# Patient Record
Sex: Female | Born: 1955 | Race: White | Hispanic: No | Smoking: Former smoker
Health system: Southern US, Community
[De-identification: ages and names within clinical notes are randomized; demographics above are authoritative.]

## PROBLEM LIST (undated history)

## (undated) DIAGNOSIS — Z86010 Personal history of colonic polyps: Secondary | ICD-10-CM

## (undated) DIAGNOSIS — F419 Anxiety disorder, unspecified: Secondary | ICD-10-CM

## (undated) DIAGNOSIS — M2540 Effusion, unspecified joint: Secondary | ICD-10-CM

## (undated) DIAGNOSIS — M542 Cervicalgia: Secondary | ICD-10-CM

## (undated) DIAGNOSIS — D4989 Neoplasm of unspecified behavior of other specified sites: Secondary | ICD-10-CM

## (undated) DIAGNOSIS — E785 Hyperlipidemia, unspecified: Secondary | ICD-10-CM

## (undated) DIAGNOSIS — R351 Nocturia: Secondary | ICD-10-CM

## (undated) DIAGNOSIS — M79661 Pain in right lower leg: Secondary | ICD-10-CM

## (undated) DIAGNOSIS — Z9289 Personal history of other medical treatment: Secondary | ICD-10-CM

## (undated) DIAGNOSIS — M1990 Unspecified osteoarthritis, unspecified site: Secondary | ICD-10-CM

## (undated) DIAGNOSIS — J449 Chronic obstructive pulmonary disease, unspecified: Secondary | ICD-10-CM

## (undated) DIAGNOSIS — K5790 Diverticulosis of intestine, part unspecified, without perforation or abscess without bleeding: Secondary | ICD-10-CM

## (undated) DIAGNOSIS — R569 Unspecified convulsions: Secondary | ICD-10-CM

## (undated) DIAGNOSIS — M549 Dorsalgia, unspecified: Secondary | ICD-10-CM

## (undated) DIAGNOSIS — G8929 Other chronic pain: Secondary | ICD-10-CM

## (undated) DIAGNOSIS — I5032 Chronic diastolic (congestive) heart failure: Secondary | ICD-10-CM

## (undated) DIAGNOSIS — F410 Panic disorder [episodic paroxysmal anxiety] without agoraphobia: Secondary | ICD-10-CM

## (undated) DIAGNOSIS — J42 Unspecified chronic bronchitis: Secondary | ICD-10-CM

## (undated) DIAGNOSIS — R112 Nausea with vomiting, unspecified: Secondary | ICD-10-CM

## (undated) DIAGNOSIS — Z9889 Other specified postprocedural states: Secondary | ICD-10-CM

## (undated) DIAGNOSIS — F429 Obsessive-compulsive disorder, unspecified: Secondary | ICD-10-CM

## (undated) DIAGNOSIS — K219 Gastro-esophageal reflux disease without esophagitis: Secondary | ICD-10-CM

## (undated) DIAGNOSIS — M2550 Pain in unspecified joint: Secondary | ICD-10-CM

## (undated) DIAGNOSIS — J189 Pneumonia, unspecified organism: Secondary | ICD-10-CM

## (undated) HISTORY — DX: Hyperlipidemia, unspecified: E78.5

## (undated) HISTORY — DX: Diverticulosis of intestine, part unspecified, without perforation or abscess without bleeding: K57.90

## (undated) HISTORY — PX: LAPAROSCOPIC LYSIS INTESTINAL ADHESIONS: SUR778

## (undated) HISTORY — DX: Gastro-esophageal reflux disease without esophagitis: K21.9

## (undated) HISTORY — PX: ECTOPIC PREGNANCY SURGERY: SHX613

## (undated) HISTORY — DX: Chronic obstructive pulmonary disease, unspecified: J44.9

## (undated) HISTORY — DX: Panic disorder (episodic paroxysmal anxiety): F41.0

## (undated) HISTORY — PX: COLONOSCOPY: SHX174

## (undated) HISTORY — DX: Obsessive-compulsive disorder, unspecified: F42.9

## (undated) HISTORY — DX: Anxiety disorder, unspecified: F41.9

## (undated) HISTORY — DX: Chronic diastolic (congestive) heart failure: I50.32

## (undated) HISTORY — PX: ESOPHAGOGASTRODUODENOSCOPY: SHX1529

## (undated) HISTORY — DX: Unspecified osteoarthritis, unspecified site: M19.90

## (undated) HISTORY — DX: Pain in right lower leg: M79.661

---

## 1997-10-27 ENCOUNTER — Emergency Department (HOSPITAL_COMMUNITY): Admission: EM | Admit: 1997-10-27 | Discharge: 1997-10-27 | Payer: Self-pay | Admitting: Emergency Medicine

## 1999-04-21 ENCOUNTER — Emergency Department (HOSPITAL_COMMUNITY): Admission: EM | Admit: 1999-04-21 | Discharge: 1999-04-22 | Payer: Self-pay

## 1999-04-22 ENCOUNTER — Emergency Department (HOSPITAL_COMMUNITY): Admission: EM | Admit: 1999-04-22 | Discharge: 1999-04-22 | Payer: Self-pay | Admitting: Emergency Medicine

## 1999-12-27 ENCOUNTER — Emergency Department (HOSPITAL_COMMUNITY): Admission: EM | Admit: 1999-12-27 | Discharge: 1999-12-27 | Payer: Self-pay | Admitting: Emergency Medicine

## 2000-10-01 ENCOUNTER — Emergency Department (HOSPITAL_COMMUNITY): Admission: EM | Admit: 2000-10-01 | Discharge: 2000-10-01 | Payer: Self-pay | Admitting: Internal Medicine

## 2000-11-02 ENCOUNTER — Other Ambulatory Visit: Admission: RE | Admit: 2000-11-02 | Discharge: 2000-11-02 | Payer: Self-pay | Admitting: Family Medicine

## 2001-03-07 ENCOUNTER — Emergency Department (HOSPITAL_COMMUNITY): Admission: EM | Admit: 2001-03-07 | Discharge: 2001-03-07 | Payer: Self-pay | Admitting: Emergency Medicine

## 2001-03-07 ENCOUNTER — Encounter: Payer: Self-pay | Admitting: Emergency Medicine

## 2001-03-19 ENCOUNTER — Encounter: Admission: RE | Admit: 2001-03-19 | Discharge: 2001-05-01 | Payer: Self-pay | Admitting: Family Medicine

## 2001-04-11 ENCOUNTER — Encounter: Payer: Self-pay | Admitting: Family Medicine

## 2001-04-11 ENCOUNTER — Ambulatory Visit (HOSPITAL_COMMUNITY): Admission: RE | Admit: 2001-04-11 | Discharge: 2001-04-11 | Payer: Self-pay | Admitting: Family Medicine

## 2001-04-29 ENCOUNTER — Emergency Department (HOSPITAL_COMMUNITY): Admission: EM | Admit: 2001-04-29 | Discharge: 2001-04-29 | Payer: Self-pay | Admitting: Emergency Medicine

## 2002-01-30 ENCOUNTER — Ambulatory Visit (HOSPITAL_COMMUNITY): Admission: RE | Admit: 2002-01-30 | Discharge: 2002-01-30 | Payer: Self-pay | Admitting: Neurosurgery

## 2002-02-14 ENCOUNTER — Other Ambulatory Visit: Admission: RE | Admit: 2002-02-14 | Discharge: 2002-02-14 | Payer: Self-pay | Admitting: Internal Medicine

## 2002-02-15 ENCOUNTER — Ambulatory Visit (HOSPITAL_COMMUNITY): Admission: RE | Admit: 2002-02-15 | Discharge: 2002-02-15 | Payer: Self-pay | Admitting: Orthopedic Surgery

## 2002-02-15 ENCOUNTER — Encounter: Payer: Self-pay | Admitting: Orthopedic Surgery

## 2002-02-15 ENCOUNTER — Encounter: Payer: Self-pay | Admitting: Internal Medicine

## 2002-02-15 ENCOUNTER — Encounter: Admission: RE | Admit: 2002-02-15 | Discharge: 2002-02-15 | Payer: Self-pay | Admitting: Internal Medicine

## 2002-02-22 ENCOUNTER — Encounter: Payer: Self-pay | Admitting: Internal Medicine

## 2002-02-22 ENCOUNTER — Encounter: Admission: RE | Admit: 2002-02-22 | Discharge: 2002-02-22 | Payer: Self-pay | Admitting: Internal Medicine

## 2002-06-25 ENCOUNTER — Emergency Department (HOSPITAL_COMMUNITY): Admission: EM | Admit: 2002-06-25 | Discharge: 2002-06-26 | Payer: Self-pay | Admitting: Emergency Medicine

## 2002-06-25 ENCOUNTER — Encounter: Payer: Self-pay | Admitting: Emergency Medicine

## 2002-08-28 ENCOUNTER — Observation Stay (HOSPITAL_COMMUNITY): Admission: AD | Admit: 2002-08-28 | Discharge: 2002-08-29 | Payer: Self-pay | Admitting: Obstetrics and Gynecology

## 2002-08-28 ENCOUNTER — Encounter: Payer: Self-pay | Admitting: Obstetrics and Gynecology

## 2002-08-28 ENCOUNTER — Encounter (INDEPENDENT_AMBULATORY_CARE_PROVIDER_SITE_OTHER): Payer: Self-pay

## 2002-10-08 ENCOUNTER — Encounter: Admission: RE | Admit: 2002-10-08 | Discharge: 2002-10-08 | Payer: Self-pay | Admitting: Family Medicine

## 2002-10-08 ENCOUNTER — Encounter: Payer: Self-pay | Admitting: Family Medicine

## 2002-11-12 ENCOUNTER — Ambulatory Visit (HOSPITAL_COMMUNITY): Admission: RE | Admit: 2002-11-12 | Discharge: 2002-11-12 | Payer: Self-pay | Admitting: Family Medicine

## 2002-11-12 ENCOUNTER — Encounter: Payer: Self-pay | Admitting: Family Medicine

## 2003-03-26 ENCOUNTER — Other Ambulatory Visit: Admission: RE | Admit: 2003-03-26 | Discharge: 2003-03-26 | Payer: Self-pay | Admitting: Obstetrics and Gynecology

## 2003-06-28 ENCOUNTER — Ambulatory Visit (HOSPITAL_COMMUNITY): Admission: RE | Admit: 2003-06-28 | Discharge: 2003-06-28 | Payer: Self-pay | Admitting: *Deleted

## 2003-08-12 ENCOUNTER — Ambulatory Visit (HOSPITAL_COMMUNITY): Admission: RE | Admit: 2003-08-12 | Discharge: 2003-08-12 | Payer: Self-pay | Admitting: *Deleted

## 2003-10-14 ENCOUNTER — Ambulatory Visit (HOSPITAL_COMMUNITY): Admission: RE | Admit: 2003-10-14 | Discharge: 2003-10-14 | Payer: Self-pay | Admitting: *Deleted

## 2003-11-13 ENCOUNTER — Ambulatory Visit (HOSPITAL_COMMUNITY): Admission: RE | Admit: 2003-11-13 | Discharge: 2003-11-13 | Payer: Self-pay | Admitting: *Deleted

## 2004-03-26 ENCOUNTER — Other Ambulatory Visit: Admission: RE | Admit: 2004-03-26 | Discharge: 2004-03-26 | Payer: Self-pay | Admitting: Obstetrics and Gynecology

## 2004-06-21 ENCOUNTER — Encounter: Admission: RE | Admit: 2004-06-21 | Discharge: 2004-06-21 | Payer: Self-pay | Admitting: Cardiology

## 2004-06-29 ENCOUNTER — Encounter: Admission: RE | Admit: 2004-06-29 | Discharge: 2004-06-29 | Payer: Self-pay | Admitting: Cardiology

## 2004-07-27 ENCOUNTER — Ambulatory Visit (HOSPITAL_COMMUNITY): Admission: RE | Admit: 2004-07-27 | Discharge: 2004-07-27 | Payer: Self-pay | Admitting: Obstetrics and Gynecology

## 2004-11-15 ENCOUNTER — Ambulatory Visit (HOSPITAL_COMMUNITY): Admission: RE | Admit: 2004-11-15 | Discharge: 2004-11-15 | Payer: Self-pay | Admitting: Obstetrics and Gynecology

## 2005-06-09 ENCOUNTER — Ambulatory Visit (HOSPITAL_COMMUNITY): Admission: RE | Admit: 2005-06-09 | Discharge: 2005-06-09 | Payer: Self-pay | Admitting: Neurosurgery

## 2005-06-23 ENCOUNTER — Ambulatory Visit: Payer: Self-pay | Admitting: Internal Medicine

## 2005-07-07 ENCOUNTER — Ambulatory Visit: Payer: Self-pay | Admitting: *Deleted

## 2005-09-22 ENCOUNTER — Ambulatory Visit: Payer: Self-pay | Admitting: Internal Medicine

## 2005-10-20 ENCOUNTER — Ambulatory Visit: Payer: Self-pay | Admitting: Internal Medicine

## 2006-02-24 ENCOUNTER — Ambulatory Visit: Payer: Self-pay | Admitting: Pulmonary Disease

## 2006-05-25 ENCOUNTER — Ambulatory Visit: Payer: Self-pay | Admitting: Internal Medicine

## 2007-06-08 DIAGNOSIS — J45909 Unspecified asthma, uncomplicated: Secondary | ICD-10-CM | POA: Insufficient documentation

## 2007-06-08 DIAGNOSIS — F410 Panic disorder [episodic paroxysmal anxiety] without agoraphobia: Secondary | ICD-10-CM

## 2007-06-08 DIAGNOSIS — F429 Obsessive-compulsive disorder, unspecified: Secondary | ICD-10-CM | POA: Insufficient documentation

## 2007-06-08 HISTORY — DX: Panic disorder (episodic paroxysmal anxiety): F41.0

## 2007-06-08 HISTORY — DX: Obsessive-compulsive disorder, unspecified: F42.9

## 2007-07-25 ENCOUNTER — Emergency Department (HOSPITAL_COMMUNITY): Admission: EM | Admit: 2007-07-25 | Discharge: 2007-07-25 | Payer: Self-pay | Admitting: Emergency Medicine

## 2007-09-03 ENCOUNTER — Emergency Department (HOSPITAL_COMMUNITY): Admission: EM | Admit: 2007-09-03 | Discharge: 2007-09-04 | Payer: Self-pay | Admitting: Emergency Medicine

## 2008-04-11 ENCOUNTER — Ambulatory Visit: Payer: Self-pay | Admitting: Internal Medicine

## 2008-04-11 ENCOUNTER — Ambulatory Visit (HOSPITAL_COMMUNITY): Admission: RE | Admit: 2008-04-11 | Discharge: 2008-04-11 | Payer: Self-pay | Admitting: Cardiology

## 2008-05-02 ENCOUNTER — Ambulatory Visit: Payer: Self-pay | Admitting: Internal Medicine

## 2008-05-05 ENCOUNTER — Telehealth (INDEPENDENT_AMBULATORY_CARE_PROVIDER_SITE_OTHER): Payer: Self-pay | Admitting: *Deleted

## 2008-06-02 ENCOUNTER — Ambulatory Visit: Payer: Self-pay | Admitting: Internal Medicine

## 2008-11-26 ENCOUNTER — Encounter
Admission: RE | Admit: 2008-11-26 | Discharge: 2008-11-26 | Payer: Self-pay | Admitting: Physical Medicine and Rehabilitation

## 2008-12-12 ENCOUNTER — Ambulatory Visit: Payer: Self-pay | Admitting: Internal Medicine

## 2009-02-19 ENCOUNTER — Ambulatory Visit: Payer: Self-pay | Admitting: Internal Medicine

## 2009-02-19 ENCOUNTER — Ambulatory Visit: Payer: Self-pay | Admitting: Pulmonary Disease

## 2009-03-23 ENCOUNTER — Ambulatory Visit: Payer: Self-pay | Admitting: Internal Medicine

## 2009-05-06 ENCOUNTER — Telehealth: Payer: Self-pay | Admitting: Internal Medicine

## 2009-10-15 ENCOUNTER — Ambulatory Visit: Payer: Self-pay | Admitting: Internal Medicine

## 2009-10-19 DIAGNOSIS — J019 Acute sinusitis, unspecified: Secondary | ICD-10-CM

## 2009-11-04 ENCOUNTER — Ambulatory Visit (HOSPITAL_COMMUNITY): Admission: RE | Admit: 2009-11-04 | Discharge: 2009-11-04 | Payer: Self-pay | Admitting: Internal Medicine

## 2009-11-11 ENCOUNTER — Encounter: Payer: Self-pay | Admitting: Internal Medicine

## 2009-11-25 ENCOUNTER — Ambulatory Visit: Payer: Self-pay | Admitting: Internal Medicine

## 2010-07-09 ENCOUNTER — Ambulatory Visit: Payer: Self-pay | Admitting: Internal Medicine

## 2010-08-14 ENCOUNTER — Encounter: Payer: Self-pay | Admitting: Cardiology

## 2010-08-14 ENCOUNTER — Encounter: Payer: Self-pay | Admitting: Internal Medicine

## 2010-08-15 ENCOUNTER — Encounter: Payer: Self-pay | Admitting: Cardiology

## 2010-08-15 ENCOUNTER — Encounter: Payer: Self-pay | Admitting: Internal Medicine

## 2010-08-16 ENCOUNTER — Encounter: Payer: Self-pay | Admitting: Obstetrics and Gynecology

## 2010-08-16 ENCOUNTER — Encounter: Payer: Self-pay | Admitting: Cardiology

## 2010-08-24 NOTE — Assessment & Plan Note (Signed)
Summary: 1 month/apc   Primary Provider/Referring Provider:  Dicksterin  CC:  1 month follow up visit.  History of Present Illness: February 19, 2009-- Presents for acute office office visit. Complains of prod cough with yellow/white mucus, green mucus from sinuses, increased SOB and wheezing x1month - Coughed up blood tinged mucus this am. Denies chest pain,  orthopnea, hemoptysis, fever, n/v/d, edema, headache. C/o of trouble w/ "menopause" symptoms w/ hot flashes, crying easily, and mood swings. Has GYN ov next week.   03/23/09- asthma/COPD, hx headinjury/ panic disorder/OCD Staying at home alone with son. Gets hot flashes, panics, . Congested cough without phlegm, wheeze. chest pain, palpitation, nasal stuffiness, sneezing, itching or drainage, Denies reflux, choking or heartburn.. Asks more samples of Delsym. Missed appt to establish primary care. Asks refill nerve pill. CXR last time -copd, hyperinflated, bronchitis. Says mood instability began with head injury.  October 15, 2009--Presents for  acute visit , pt c/o left ear pain, sore throat, nose bleeds x  prod cough  yelllow- green , symptons x 1 week.OTC are nto working. Sinus are stopped up, w/ sinus pressure. Denies chest pain, dyspnea, orthopnea, hemoptysis, fever, n/v/d, edema, headache,recent travel or antibiotics.   12/12/2009- Asthma/ COPD, hx head injury/ panic disorder Back to dry cough which is her baseline, asking about getting an antibiotic and something for her allergies. She used up the cough syrup and augmentin given last visit and admits cough isn't nealry as bad No longer smokes. Cough is nonproductive. No fever, blood, nodes, chest pain or wheeze, nausea or vomiting.    Current Medications (verified): 1)  Nexium 40 Mg  Cpdr (Esomeprazole Magnesium) .... Take 1 Tablet By Mouth Once A Day 2)  Alprazolam 1 Mg Tabs (Alprazolam) .... Take 1 Tablet By Mouth  Three Times A Day As Needed 3)  Advair Diskus 500-50 Mcg/dose  Misc  (Fluticasone-Salmeterol) .Marland Kitchen.. 1 Inhalation Two Times A Day 4)  Proair Hfa 108 (90 Base) Mcg/act Aers (Albuterol Sulfate) .... 2 Puffs Every 4 Hours As Needed 5)  Albuterol Sulfate (2.5 Mg/16ml) 0.083% Nebu (Albuterol Sulfate) .Marland Kitchen.. 1 Vial Vial Hhn Every 4 Hours As Needed 6)  Nasonex 50 Mcg/act Susp (Mometasone Furoate) .... Instill 1 Spray Into Each Nostril Daily As Needed 7)  Spiriva Handihaler 18 Mcg Caps (Tiotropium Bromide Monohydrate) .Marland Kitchen.. 1 Daily  Allergies (verified): No Known Drug Allergies  Past History:  Past Medical History: Last updated: 04/11/2008 OBSESSIVE-COMPULSIVE DISORDER (ICD-300.3) PANIC DISORDER (ICD-300.01) COPD (ICD-496) ASTHMA (ICD-493.90)    Past Surgical History: Last updated: 12/12/2008 Tubal pregnancy x 2  Lysis of adhesions.  Family History: Last updated: 2009-12-12 Father- died MI Mother- died DM, PE Sisters- breast cancer  Social History: Last updated: 12-Dec-2009 Patient states former smoker.  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  The patient denies anorexia, fever, weight loss, weight gain, vision loss, decreased hearing, hoarseness, chest pain, syncope, dyspnea on exertion, peripheral edema, headaches, hemoptysis, and severe indigestion/heartburn.    Vital Signs:  Patient profile:   54  year old female Height:      66 inches Weight:      174 pounds BMI:     28.19 O2 Sat:      99 % on Room air Pulse rate:   74 / minute BP sitting:   110 / 78  (left arm) Cuff size:   regular  Vitals Entered By: Reynaldo Minium CMA (Nov 25, 2009 10:48 AM)  O2 Flow:  Room air  Physical Exam  Additional Exam:  General: A/Ox3; NAD  SKIN: no rash, lesions NODES: no lymphadenopathy HEENT: [place]/AT, EOM- WNL, Conjuctivae- clear,   TM-WNL, Nose- red mucosa, max tenderness. , Throat- clear and wnl, Mallampati II NECK: Supple w/ fair ROM, JVD- none, normal carotid impulses w/o bruits Thyroid- CHEST:Coarse BS w/ no wheeizng  HEART: RRR, no m/g/r heard ABDOMEN: soft NT ZOX:WRUE, nl pulses, no edema  NEURO: Grossly intact to observation      Impression & Recommendations:  Problem # 1:  COPD (ICD-496) Improved. There is a mild bronchitis residual now. We agreed that she would use an otc cough syrup if needed and we will give doxycycline.  Problem # 2:  SINUSITIS, ACUTE (ICD-461.9) There may be some residual postnasal drip contributing to cough, but not overt sinusitis now. The following medications were removed from the medication list:    Augmentin 875-125 Mg Tabs (Amoxicillin-pot clavulanate) .Marland Kitchen... 1 by mouth two times a day    Hydromet 5-1.5 Mg/21ml Syrp (Hydrocodone-homatropine) .Marland Kitchen... 1-2 every 4-6 hr as needed cough, may make you sleepy Her updated medication list for this problem includes:    Nasonex 50 Mcg/act Susp (Mometasone furoate) ..... Instill 1 spray into each nostril daily as needed    Doxycycline Hyclate 100 Mg Caps (Doxycycline hyclate) .Marland Kitchen... 2 today then one daily  Medications Added to Medication List This Visit: 1)  Doxycycline Hyclate 100 Mg Caps (Doxycycline hyclate) .... 2 today then one daily  Other Orders: Est. Patient Level II (45409)  Patient Instructions: 1)  Please schedule a follow-up appointment in 1 year. 2)  Try generic claritin (loratadine) otc for allergy 3)  Try otc cough syrup- Robitussin DM or Delsym might be good 4)  script for doxycycline antibiotic sent to drug store Prescriptions: DOXYCYCLINE HYCLATE 100 MG CAPS (DOXYCYCLINE HYCLATE) 2 today then one daily  #8 x  0   Entered and Authorized by:   Waymon Budge MD   Signed by:   Waymon Budge MD on 11/25/2009   Method used:   Electronically to        [id number] (ret[facility])       [address]       [place], [place]  [address]       Ph: [phone] or [phone]       Fax: [phone]   RxID:   [id number]

## 2010-08-24 NOTE — Letter (Signed)
Summary: [place] Results Follow Up Letter  [facility]  [address]   [place], [place] [address]   Phone: [phone]  Fax: [phone]    11/11/2009 MRN: [id number]  [facility] [address] [place], [place]  [address]  Dear Ms. Senaida Ores,  We have received the results from your recent tests and have been unable to contact you.  Please call our office at [phone] so that Dr. Maple Hudson or his nurse may review the results with you.    Thank you,  Nature conservation officer Pulmonary Division

## 2010-08-24 NOTE — Assessment & Plan Note (Signed)
Summary: NOSE BLEEDS/ SINUS CONGESTION///kp   Primary Provider/Referring Provider:  Dicksterin  CC:  Acute visit , pt c/o left ear pain, sore throat, nose bleeds x 2 prod cough  yelllow- green , and symptons x 1 week.  History of Present Illness: 55 yo female.    12-16-2008- asthma/COPD, hx head injury/ panic disorder/ OCD 1 month coughing green. Sore through mid chest front to back. No fever or blood. Not smokng. Asks help for nerves. [facility]- told not elligible there. Recent divorce..  February 19, 2009-- Presents for acute office office visit. Complains of prod cough with yellow/white mucus, green mucus from sinuses, increased SOB and wheezing x49month - Coughed up blood tinged mucus this am. Denies chest pain,  orthopnea, hemoptysis, fever, n/v/d, edema, headache. C/o of trouble w/ "menopause" symptoms w/ hot flashes, crying easily, and mood swings. Has GYN ov next week.   03/23/09- asthma/COPD, hx headinjury/ panic disorder/OCD Staying at home alone with son. Gets hot flashes, panics, . Congested cough without phlegm, wheeze. chest pain, palpitation, nasal stuffiness, sneezing, itching or drainage, Denies reflux, choking or heartburn.. Asks more samples of Delsym. Missed appt to establish primary care. Asks refill nerve pill. CXR last time -copd, hyperinflated, bronchitis. Says mood instability began with head injury.  October 15, 2009--Presents for  acute visit , pt c/o left ear pain, sore throat, nose bleeds x  prod cough  yelllow- green , symptons x 1 week.OTC are nto working. Sinus are stopped up, w/ sinus pressure. Denies chest pain, dyspnea, orthopnea, hemoptysis, fever, n/v/d, edema, headache,recent travel or antibiotics.     Current Medications (verified): 1)  Nexium 40 Mg  Cpdr (Esomeprazole Magnesium) .... Take 1 Tablet By Mouth Once A Day 2)  Alprazolam 1 Mg Tabs (Alprazolam) .... Take 1 Tablet By Mouth  Three Times A Day As Needed 3)  Advair Diskus 500-50  Mcg/dose  Misc (Fluticasone-Salmeterol) .Marland Kitchen.. 1 Inhalation Two Times A Day 4)  Proair Hfa 108 (90 Base) Mcg/act Aers (Albuterol Sulfate) .... 2 Puffs Every 4 Hours As Needed 5)  Albuterol Sulfate (2.5 Mg/79ml) 0.083% Nebu (Albuterol Sulfate) .Marland Kitchen.. 1 Vial Vial Hhn Every 4 Hours As Needed 6)  Nasonex 50 Mcg/act Susp (Mometasone Furoate) .... Instill 1 Spray Into Each Nostril Daily As Needed 7)  Spiriva Handihaler 18 Mcg Caps (Tiotropium Bromide Monohydrate) .Marland Kitchen.. 1 Daily  Allergies (verified): No Known Drug Allergies  Past History:  Past Medical History: Last updated: 04/11/2008 OBSESSIVE-COMPULSIVE DISORDER (ICD-300.3) PANIC DISORDER (ICD-300.01) COPD (ICD-496) ASTHMA (ICD-493.90)    Past Surgical History: Last updated: 12/16/08 Tubal pregnancy x 2  Lysis of adhesions.  Family History: Last updated: 16-Dec-2008 Father- died MI Mother- died DM, PE  Social History: Last updated: 12/16/2008 Patient states former smoker.  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  Review of Systems      See HPI  Vital Signs:  Patient profile:   55 year old female Height:      66 inches Weight:      174.2 pounds BMI:     28.22 O2 Sat:      96 % on Room air Temp:     97.6 degrees F oral Pulse rate:   72 / minute BP sitting:   118 / 80  (right arm)  Vitals Entered By: Renold Genta RCP, LPN (October 15, 2009 10:14 AM)  O2 Sat at Rest %:  96% O2 Flow:  Room air CC: Acute visit ,  pt c/o left ear pain, sore throat, nose bleeds x 2 prod cough  yelllow- green , symptons x 1 week Comments Medications reviewed with patient Renold Genta RCP, LPN  October 15, 2009 10:14 AM    Physical Exam  Additional Exam:  General: A/Ox3; NAD  SKIN: no rash, lesions NODES: no lymphadenopathy HEENT: [place]/AT, EOM- WNL, Conjuctivae- clear,   TM-WNL, Nose- red mucosa, max tenderness. , Throat- clear and wnl, melampatti II NECK: Supple w/ fair ROM, JVD- none, normal carotid  impulses w/o bruits Thyroid- CHEST:Coarse BS w/ no wheeizng  HEART: RRR, no m/g/r heard ABDOMEN: soft NT WJX:BJYN, nl pulses, no edema  NEURO: Grossly intact to observation      Impression & Recommendations:  Problem # 1:  SINUSITIS, ACUTE (ICD-461.9)  Augmentin 875mg  two times a day 10 days Mucinex DM two times a day as needed cough/congestion  Hydromet 1-2 tsp every 4-6 hr as needed cough  Salt water gargles as needed  Please contact office for sooner follow up if symptoms do not improve or worsen  follow up Dr. Maple Hudson in 4 weeks.  The following medications were removed from the medication list:    Zithromax Z-pak 250 Mg Tabs (Azithromycin) .Marland Kitchen... 2 today then one daily Her updated medication list for this problem includes:    Nasonex 50 Mcg/act Susp (Mometasone furoate) ..... Instill 1 spray into each nostril daily as needed    Augmentin 875-125 Mg Tabs (Amoxicillin-pot clavulanate) .Marland Kitchen... 1 by mouth two times a day    Hydromet 5-1.5 Mg/75ml Syrp (Hydrocodone-homatropine) .Marland Kitchen... 1-2 every 4-6 hr as needed cough, may make you sleepy  Orders: Est. Patient Level IV ([address])  Medications Added to Medication List This Visit: 1)  Augmentin 875-125 Mg Tabs (Amoxicillin-pot clavulanate) .Marland Kitchen.. 1 by mouth two times a day 2)  Hydromet 5-1.5 Mg/42ml Syrp (Hydrocodone-homatropine) .Marland Kitchen.. 1-2 every 4-6 hr as needed cough, may make you sleepy  Complete Medication List: 1)  Nexium 40 Mg Cpdr (Esomeprazole magnesium) .... Take 1 tablet by mouth once a day 2)  Alprazolam 1 Mg Tabs (Alprazolam) .... Take 1 tablet by mouth  three times a day as needed 3)  Advair Diskus 500-50 Mcg/dose Misc (Fluticasone-salmeterol) .Marland Kitchen.. 1 inhalation two times a day 4)  Proair Hfa 108 (90 Base) Mcg/act Aers (Albuterol sulfate) .... 2 puffs every 4 hours as needed 5)  Albuterol Sulfate (2.5 Mg/2ml) 0.083% Nebu (Albuterol sulfate) .Marland Kitchen.. 1 vial vial hhn every 4 hours as needed 6)  Nasonex 50 Mcg/act Susp (Mometasone furoate)  .... Instill 1 spray into each nostril daily as needed 7)  Spiriva Handihaler 18 Mcg Caps (Tiotropium bromide monohydrate) .Marland Kitchen.. 1 daily 8)  Augmentin 875-125 Mg Tabs (Amoxicillin-pot clavulanate) .Marland Kitchen.. 1 by mouth two times a day 9)  Hydromet 5-1.5 Mg/108ml Syrp (Hydrocodone-homatropine) .Marland Kitchen.. 1-2 every 4-6 hr as needed cough, may make you sleepy  Patient Instructions: 1)  Augmentin 875mg  two times a day 10 days 2)  Mucinex DM two times a day as needed cough/congestion  3)  Hydromet 1-2 tsp every 4-6 hr as needed cough  4)  Salt water gargles as needed  5)  Please contact office for sooner follow up if symptoms do not improve or worsen  6)  follow up Dr. Maple Hudson in 4 weeks.  Prescriptions: AUGMENTIN 875-125 MG TABS (AMOXICILLIN-POT CLAVULANATE) 1 by mouth two times a day  #20 x 0   Entered and Authorized by:   Rubye Oaks NP   Signed by:   Rubye Oaks NP  on 10/15/2009   Method used:   [facility] to        [facility]       [address]       [place], [place]  [address]       Ph: [phone]       Fax: [phone]   RxID:   [id number] ALPRAZOLAM 1 MG TABS (ALPRAZOLAM) Take 1 tablet by mouth  three times a day as needed  #30 x 0   Entered and Authorized by:   Rubye Oaks NP   Signed by:   Tammy Parrett NP on 10/15/2009   Method used:   Print then Give to Patient   RxID:   [id number] HYDROMET 5-1.5 MG/5ML SYRP (HYDROCODONE-HOMATROPINE) 1-2 every 4-6 hr as needed cough, may make you sleepy  #8 oz x 0   Entered and Authorized by:   Rubye Oaks NP   Signed by:   Tammy Parrett NP on 10/15/2009   Method used:   Print then Give to Patient   RxID:   [id number]

## 2010-09-08 ENCOUNTER — Other Ambulatory Visit (HOSPITAL_COMMUNITY): Payer: Self-pay | Admitting: Obstetrics & Gynecology

## 2010-09-08 DIAGNOSIS — Z780 Asymptomatic menopausal state: Secondary | ICD-10-CM

## 2010-09-14 ENCOUNTER — Ambulatory Visit (HOSPITAL_COMMUNITY)
Admission: RE | Admit: 2010-09-14 | Discharge: 2010-09-14 | Disposition: A | Discharge status: Home / Self Care | Payer: Medicare Other | Source: Ambulatory Visit | Attending: Obstetrics & Gynecology | Admitting: Obstetrics & Gynecology

## 2010-09-14 DIAGNOSIS — N951 Menopausal and female climacteric states: Secondary | ICD-10-CM | POA: Insufficient documentation

## 2010-09-14 DIAGNOSIS — Z780 Asymptomatic menopausal state: Secondary | ICD-10-CM

## 2010-09-14 DIAGNOSIS — Z13820 Encounter for screening for osteoporosis: Secondary | ICD-10-CM | POA: Insufficient documentation

## 2010-09-29 ENCOUNTER — Telehealth (INDEPENDENT_AMBULATORY_CARE_PROVIDER_SITE_OTHER): Payer: Self-pay | Admitting: *Deleted

## 2010-09-30 ENCOUNTER — Telehealth (INDEPENDENT_AMBULATORY_CARE_PROVIDER_SITE_OTHER): Payer: Self-pay | Admitting: *Deleted

## 2010-09-30 ENCOUNTER — Ambulatory Visit: Payer: Medicare Other | Admitting: Internal Medicine

## 2010-10-01 ENCOUNTER — Encounter: Payer: Self-pay | Admitting: Internal Medicine

## 2010-10-01 ENCOUNTER — Ambulatory Visit (INDEPENDENT_AMBULATORY_CARE_PROVIDER_SITE_OTHER): Payer: Medicare Other | Admitting: Internal Medicine

## 2010-10-01 DIAGNOSIS — J309 Allergic rhinitis, unspecified: Secondary | ICD-10-CM

## 2010-10-01 DIAGNOSIS — J449 Chronic obstructive pulmonary disease, unspecified: Secondary | ICD-10-CM

## 2010-10-01 HISTORY — DX: Allergic rhinitis, unspecified: J30.9

## 2010-10-05 NOTE — Progress Notes (Signed)
Summary: dizziness/ face swollen - ATC NA -pt returned call  Phone Note Call from Patient Call back at Home Phone [phone]   Caller: Patient Call For: young Summary of Call: pt just called to cancel appt for 11:30 w/ dr young. she was dizzy. had pulled to the side of the road (driving). her son is going to pick her up. she is a block away from her home. still wants to see dr young for face swelling/ nose bleeds. i advised i would leave a msg but also asked that she have son take her to ER or UC rather than wait for a call back. she didn't seem to want to do that. said she would go home first.  Initial call taken by: Tivis Ringer, CNA,  September 30, 2010 11:25 AM  Follow-up for Phone Call        ATC # provided above - no answer and unable to leave message.  WCB Crystal Jones RN  September 30, 2010 11:37 AM  ATC # provided above - no answer and unable to leave message. WCB Crystal Jones RN  September 30, 2010 11:49 AM  LMOMTCB Vernie Murders  September 30, 2010 3:40 PM  Pt returned call.  States she had to cancel appt for this am bc she was dizzy.  She has to pull over.  Per pt, this has improved.  However, pt states she is still having swelling on right side of nose and eye area.  She still would like to be seen for this.  Pls advise if pt can be worked in.  Thanks!  Follow-up by: Gweneth Dimitri RN,  September 30, 2010 4:04 PM  Additional Follow-up for Phone Call Additional follow up Details #1::        Please let patient know she can come in Friday 09-30-2010 at 1030am with CDY.Reynaldo Minium CMA  September 30, 2010 4:20 PM   ATC # provided above - NA and unable to leave message.  WCB.  Gweneth Dimitri RN  September 30, 2010 4:30 PM     Additional Follow-up for Phone Call Additional follow up Details #2::    pt returned call- per Florentina Addison i have put pt on schedule for tomorrow w/ dr young. pt is aware. nothing further needed. Tivis Ringer, CNA  September 30, 2010 5:05 PM

## 2010-10-05 NOTE — Progress Notes (Signed)
Summary: req to see dr young this am  Phone Note Call from Patient Call back at [facility] Phone [phone]   Caller: Patient Call For: YOUNG Summary of Call: pt wants to see dr young asap this morning. she c/o swollen right eye/ right side of nose. says this happened "overnight. she had "a slight nose bleed" several days ago but no bleeding now. she says she didn't want to go to ER or urgent care and "wait". wants a call back asap- says she would see "anyone". pls advise. no other symptoms re: N or V' no blurred vision or chest/ shoulder pain. feels this is sinus related. [facility] [address] note to triage: dr young is only here in the am today Initial call taken by: Tivis Ringer, CNA,  September 29, 2010 9:21 AM  Follow-up for Phone Call        Spoke with pt.  She states that last night she started to notice some swelling around her nose and eye.  She states that she had nose bleed also, but did not last long. She denies any other complaints.  Per Florentina Addison, we can work her in tommorrow 3/8 at 11:30 and advise her to arrive at 11:15.  I advised pt of this and told her that if she did not feel that she could wait until then or gets worse, needs to go to PCP or UC.  Pt verbalized understanding and appt was sched. Follow-up by: Vernie Murders,  September 29, 2010 9:38 AM

## 2010-10-12 NOTE — Assessment & Plan Note (Signed)
Summary: per katie//kp   Primary Provider/Referring Provider:  Dicksterin  CC:  Acute visit-edema under Right eye and redness. States she had dizzy spell yesterday and pulled over to wait for son to pick up.Marland Kitchen  History of Present Illness: October 15, 2009--Presents for  acute visit , pt c/o left ear pain, sore throat, nose bleeds x  prod cough  yelllow- green , symptons x 1 week.OTC are nto working. Sinus are stopped up, w/ sinus pressure. Denies chest pain, dyspnea, orthopnea, hemoptysis, fever, n/v/d, edema, headache,recent travel or antibiotics.   12-21-09- Asthma/ COPD, hx head injury/ panic disorder Back to dry cough which is her baseline, asking about getting an antibiotic and something for her allergies. She used up the cough syrup and augmentin given last visit and admits cough isn't nealry as bad No longer smokes. Cough is nonproductive. No fever, blood, nodes, chest pain or wheeze, nausea or vomiting.  October 01, 2010- Asthma/ COPD, hx head injury/ panic disorder Nurse-CC: Acute visit-edema under Right eye and redness. States she had dizzy spell yesterday and pulled over to wait for son to pick up. Describes puffy redness, ? blistering under right eye, 2 days ago. Occasional epistaxis. Recent Z pak from clinic [address] in February. Coughs and wheeze, occasional mucus/ phlegm. Nasal congestion and sniffing.Continues Advair and Spiriva with home neb.     Asthma History    Initial Asthma Severity Rating:    Age range: 12+ years    Symptoms: >2 days/week; not daily    Nighttime Awakenings: 0-2/month    Interferes w/ normal activity: no limitations    SABA use (not for EIB): >2 days/week but not >1X/day    Asthma Severity Assessment: Mild Persistent   Preventive Screening-Counseling & Management  Alcohol-Tobacco     Smoking Status: quit     Year Quit: 2009     Pack years: occasionally  Current Medications (verified): 1)  Nexium 40 Mg  Cpdr (Esomeprazole Magnesium)  .... Take 1 Tablet By Mouth Once A Day 2)  Alprazolam 1 Mg Tabs (Alprazolam) .... Take 1 Tablet By Mouth  Three Times A Day As Needed 3)  Advair Diskus 500-50 Mcg/dose  Misc (Fluticasone-Salmeterol) .Marland Kitchen.. 1 Inhalation Two Times A Day 4)  Proair Hfa 108 (90 Base) Mcg/act Aers (Albuterol Sulfate) .... 2 Puffs Every 4 Hours As Needed 5)  Albuterol Sulfate (2.5 Mg/6ml) 0.083% Nebu (Albuterol Sulfate) .Marland Kitchen.. 1 Vial Vial Hhn Every 4 Hours As Needed 6)  Spiriva Handihaler 18 Mcg Caps (Tiotropium Bromide Monohydrate) .Marland Kitchen.. 1 Daily  Allergies (verified): No Known Drug Allergies  Past History:  Past Medical History: Last updated: 04/11/2008 OBSESSIVE-COMPULSIVE DISORDER (ICD-300.3) PANIC DISORDER (ICD-300.01) COPD (ICD-496) ASTHMA (ICD-493.90)    Past Surgical History: Last updated: 12/12/2008 Tubal pregnancy x 2  Lysis of adhesions.  Family History: Last updated: 2009/12/21 Father- died MI Mother- died DM, PE Sisters- breast cancer  Social History: Last updated: 2009-12-21 Patient states former smoker.  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  The patient denies shortness of breath at rest, productive cough, non-productive cough, coughing up blood, chest pain, irregular heartbeats, acid heartburn, indigestion, loss of appetite, weight change, abdominal pain, difficulty swallowing, sore throat, tooth/dental problems, headaches, nasal congestion/difficulty  breathing through nose, and sneezing.    Vital Signs:  Patient profile:   55 year old female Height:      66 inches Weight:      189.50 pounds BMI:     30.70 O2 Sat:      97 % on Room air Pulse rate:   79 / minute BP sitting:   116 / 78  (right arm) Cuff size:   regular  Vitals Entered By: Reynaldo Minium CMA (October 01, 2010 11:16 AM)  O2 Flow:  Room air CC: Acute visit-edema under Right eye and redness. States she had dizzy spell yesterday and pulled over to wait for son to pick up.   Physical Exam  Additional Exam:  General: A/Ox3; NAD , very talkative SKIN: no rash, lesions NODES: no lymphadenopathy HEENT: /AT, EOM- WNL, Conjuctivae- clear,   TM-WNL, Nose- mucoid rhinitis , Throat- clear and wnl, Mallampati II NECK: Supple w/ fair ROM, JVD- none, normal carotid impulses w/o bruits Thyroid- CHEST:Coarse BS with mild, unlabored wheeze HEART: RRR, no m/g/r heard ABDOMEN: soft NT ZDG:UYQI, nl pulses, no edema  NEURO: Grossly intact to observation      Impression & Recommendations:  Problem # 1:  COPD (ICD-496) Exacerbation- depo 80 Anxiety sometimes affects her interpretation of how she is doing.  Problem # 2:  ALLERGIC RHINITIS (ICD-477.9) Insurance wouldn't cover Nasonex, will offer flonase Exacerbation- depo should help this as well The following medications were removed from the medication list:    Nasonex 50 Mcg/act Susp (Mometasone furoate) ..... Instill 1 spray into each nostril daily as needed Her updated medication list for this problem includes:    Flonase 50 Mcg/act Susp (Fluticasone propionate) .Marland Kitchen... 2 sprays each nostril once daily  Medications Added to Medication List This Visit: 1)  Flonase 50 Mcg/act Susp (Fluticasone propionate) .... 2 sprays each nostril once daily  Other Orders: Est. Patient Level III (34742) Depo- Medrol 80mg  (J1040) Admin of Therapeutic Inj  intramuscular or subcutaneous (59563)  Patient Instructions: 1)  Please schedule a follow-up appointment in 3 months. 2)  Depo 80 3)  Try the loratadine antihistamine for your nose as needed.  4)  script for generic flonase nasal spray to try instead of Nasonex Prescriptions: FLONASE 50 MCG/ACT SUSP (FLUTICASONE PROPIONATE) 2 sprays each nostril once daily  #1 x prn   Entered and Authorized by:    Waymon Budge MD   Signed by:   Waymon Budge MD on 10/01/2010   Method used:   Print then Give to Patient   RxID:   [id number]      Medication Administration  Injection # 1:    Medication: Depo- Medrol 80mg     Diagnosis: ALLERGIC RHINITIS (ICD-477.9)    Route: IM    Site: RUOQ gluteus    Exp Date: 01/2013    Lot #: [id number]    Mfr: Pharmacia    Patient tolerated injection without complications    Given by: Carver Fila (October 01, 2010 11:54 AM)  Orders Added: 1)  Est. Patient Level III [60630] 2)  Depo- Medrol 80mg  [J1040] 3)  Admin of Therapeutic Inj  intramuscular or subcutaneous [16010]

## 2010-11-18 ENCOUNTER — Other Ambulatory Visit (HOSPITAL_COMMUNITY): Payer: Self-pay | Admitting: Obstetrics & Gynecology

## 2010-11-18 DIAGNOSIS — Z1231 Encounter for screening mammogram for malignant neoplasm of breast: Secondary | ICD-10-CM

## 2010-11-24 ENCOUNTER — Ambulatory Visit: Payer: Self-pay | Admitting: Internal Medicine

## 2010-11-26 ENCOUNTER — Ambulatory Visit (HOSPITAL_COMMUNITY)
Admission: RE | Admit: 2010-11-26 | Discharge: 2010-11-26 | Disposition: A | Discharge status: Home / Self Care | Payer: Medicare Other | Source: Ambulatory Visit | Attending: Obstetrics & Gynecology | Admitting: Obstetrics & Gynecology

## 2010-11-26 DIAGNOSIS — Z1231 Encounter for screening mammogram for malignant neoplasm of breast: Secondary | ICD-10-CM

## 2010-12-10 NOTE — Op Note (Signed)
NAME:  Felicia Odom, Felicia Odom                       ACCOUNT NO.:  [id number]   MEDICAL RECORD NO.:  [id number]                   PATIENT TYPE:  MAT   LOCATION:  [facility]                                 FACILITY:  [facility]   PHYSICIAN:  Naima A. Dillard, M.D.              DATE OF BIRTH:  22-Nov-1955   DATE OF PROCEDURE:  08/28/2002  DATE OF DISCHARGE:                                 OPERATIVE REPORT   PREOPERATIVE DIAGNOSIS:  Left adnexal ectopic pregnancy.   POSTOPERATIVE DIAGNOSIS:  Left adnexal ectopic pregnancy.   PROCEDURE:  Left salpingectomy.   SURGEON:  Naima A. Normand Sloop, M.D.   ASSISTANT:  Renaldo Reel. Emilee Hero, C.N.M.   ESTIMATED BLOOD LOSS:  Less than 100 mL.   URINE OUTPUT:  100 mL of clear urine.   FLUIDS:  1100 mL of crystalloid.   FINDINGS:  Large left ectopic pregnancy, probably about 7 cm in the left  fallopian tube.  Normal ovaries bilaterally.  No __________.  There was a  small amount of blood in the cul-de-sac.  There was an anterior uterine  abdominal wall adhesion, some mild right cecal anterior abdominal wall  adhesions.  However, the appendix was normal.  Right fallopian tube was  surgically absent.  Normal sized uterus, normal appearing liver.   PROCEDURE IN DETAIL:  The patient was taken to the operating room where she  was given general anesthesia, placed in the dorsal lithotomy position, and  prepped and draped in the normal sterile fashion.  A bivalve speculum was  placed into the vagina.  The anterior lip of the cervix was grasped with a  single-tooth tenaculum and a Hulka tenaculum was placed into the uterine  cavity as a means to manipulate the uterus.  Attention was then turned to  the patient's abdomen where a 2-mm incision was made just above the  umbilicus and carried down to the fascia sharply using the knife.  The  fascia was then incised in the midline and extended bilaterally using Mayo  scissors.  The peritoneum was identified, tented up, and entered  with  Metzenbaum scissors.  There were noted to be no adhesions.  The fascia was  closed in a pursestring fashion and a Hasson was placed into the peritoneal  cavity and anchored to the suture, placed in a pursestring in the fascia.  Insufflation was then done of the abdominal cavity.  The laparoscope was  then placed and the findings noted above were seen.  A 5-mm incision was  then made with the scalpel in the right lower quadrant.  A 5-mm trocar was  placed under direct visualization with laparoscope.  Using the probe, the  bowel was removed away from the cul-de-sac.  The patient had a large 7 cm  ectopic in her left fallopian tube.  A 10-mm incision was then made in the  left lower quadrant and a 10-mm trocar was placed under direct visualization  with the laparoscope.  Using the tripolar cautery, a salpingectomy was done  without difficulty.  Hemostasis was assured.  The ectopic pregnancy was then  placed in an Endoloop bag and the bag was then delivered out of the left  lower trocar site.  The fascia did, however, have to be extended due to how  large the ectopic was.  The trocar was then replaced once the ectopic was  removed.  Irrigation was then done of the peritoneal cavity.  Hemostasis was  assured.  All trocars were removed under direct visualization of the  laparoscope.  The gas was allowed to leave the abdomen.  The Hasson was  removed.  The fascia in the left lower quadrant was repaired using 0 Vicryl  in a running fashion.  The skin was repaired with 4-0 Vicryl in a  subcuticular stitch.  Once the Hasson was removed, __________  was found to  be present.  The pursestring suture was then tied to close the fascia.  The  skin was closed in subcuticular fashion.  The Hulka tenaculum was removed  from the cervix.  Hemostasis was noted.  Sponge, lap, and needle counts were  correct x2.  The patient entered the recovery room in stable condition.  Marcaine 0.25%was injected into all  incisions.  The umbilical incision sites  used were injected in the left lower quadrant, right lower quadrant 3 mL  were injected.  Before the surgery the patient was told the risks are, but  not limited to, bleeding, infection, damage to internal organs such as bowel  and bladder and major blood vessels.                                               Naima A. Normand Sloop, M.D.    NAD/MEDQ  D:  08/28/2002  T:  08/28/2002  Job:  [id number]
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
## 2010-12-30 ENCOUNTER — Encounter: Payer: Self-pay | Admitting: Internal Medicine

## 2010-12-31 ENCOUNTER — Ambulatory Visit (INDEPENDENT_AMBULATORY_CARE_PROVIDER_SITE_OTHER)
Admission: RE | Admit: 2010-12-31 | Discharge: 2010-12-31 | Disposition: A | Discharge status: Home / Self Care | Payer: Medicare Other | Source: Ambulatory Visit | Attending: Internal Medicine | Admitting: Internal Medicine

## 2010-12-31 ENCOUNTER — Ambulatory Visit (INDEPENDENT_AMBULATORY_CARE_PROVIDER_SITE_OTHER): Payer: Medicare Other | Admitting: Internal Medicine

## 2010-12-31 ENCOUNTER — Encounter: Payer: Self-pay | Admitting: Internal Medicine

## 2010-12-31 VITALS — BP 122/82 | HR 88 | Ht 66.0 in | Wt 186.8 lb

## 2010-12-31 DIAGNOSIS — J40 Bronchitis, not specified as acute or chronic: Secondary | ICD-10-CM

## 2010-12-31 DIAGNOSIS — J449 Chronic obstructive pulmonary disease, unspecified: Secondary | ICD-10-CM

## 2010-12-31 MED ORDER — AZITHROMYCIN 250 MG PO TABS: ORAL_TABLET | ORAL | Status: AC

## 2010-12-31 MED ORDER — HYDROCODONE-HOMATROPINE 5-1.5 MG/5ML PO SYRP: 5.0000 mL | ORAL_SOLUTION | Freq: Four times a day (QID) | ORAL | Status: AC | PRN

## 2010-12-31 MED ORDER — METHYLPREDNISOLONE ACETATE 80 MG/ML IJ SUSP
80.0000 mg | Freq: Once | INTRAMUSCULAR | Status: AC
  Administered 2010-12-31: 80 mg via INTRAMUSCULAR

## 2010-12-31 NOTE — Patient Instructions (Signed)
Depo 80  Script for Z pak  Script for cough syrup  Order- CXR- bronchitis

## 2010-12-31 NOTE — Assessment & Plan Note (Signed)
Acute bronchitis probably viral. We will give depo, cough syrup, Zpak and cough syrup

## 2010-12-31 NOTE — Progress Notes (Signed)
  Subjective:    Patient ID: Felicia Odom, female    DOB: 04/17/1956, 55 y.o.   MRN: [id number]  HPI 12/31/10- 28 yo former smoker followed for COPD, Allergic rhinitis Last here October 01, 2010- note reviewed Now acute illness through family with febrile illness and cough over past 2 weeks. She had chills, raspy cough, productive yellow, tussive soreness. Not as sick now as last week. She took an old bottle of amoxacillin, several aspirin, and sudafed and her nebulizer/ albuterol, spiriva.    Review of Systems Constitutional:   No weight loss, night sweats,  Fevers, chills, fatigue, lassitude. HEENT:     No sneezing, itching, ear ache, nasal congestion, post nasal drip,     CV:  No chest pain, orthopnea, PND, swelling in lower extremities, anasarca, dizziness, palpitations  GI  No heartburn, indigestion, abdominal pain, nausea, vomiting, diarrhea, change in bowel habits, loss of appetite  Resp: No shortness of breath with exertion or at rest,  No coughing up of blood.  .  No wheezing.    Skin: no rash or lesions.  GU: no dysuria, change in color of urine, no urgency or frequency.  No flank pain.  MS:  No joint pain or swelling.  No decreased range of motion.  No back pain.  Psych:  No change in mood or affect. No depression or anxiety.  No memory loss.      Objective:   Physical Exam General- Alert, Oriented, Affect-appropriate, Distress- none acute  Got anxious and tearful  Skin- rash-none, lesions- none, excoriation- none   Flushed  Lymphadenopathy- none  Head- atraumatic  Eyes- Gross vision intact, PERRLA, conjunctivae clear secretions  Ears- Hearing, canals, Tm - normal  Nose- Clear, No- Septal dev, mucus, polyps, erosion, perforation   Throat- Mallampati II , mucosa clear , drainage- none, tonsils- atrophic  Neck- flexible , trachea midline, no stridor , thyroid nl, carotid no bruit  Chest - symmetrical excursion , unlabored     Heart/CV- RRR , no murmur ,  no gallop  , no rub, nl s1 s2                     - JVD- none , edema- none, stasis changes- none, varices- none     Lung- Raspy cough  , dullness-none, rub- none     Chest wall-  Abd- tender-no, distended-no, bowel sounds-present, HSM- no  Br/ Gen/ Rectal- Not done, not indicated  Extrem- cyanosis- none, clubbing, none, atrophy- none, strength- nl  Neuro- grossly intact to observation          Assessment & Plan:

## 2011-01-03 ENCOUNTER — Telehealth: Payer: Self-pay | Admitting: Internal Medicine

## 2011-01-03 NOTE — Telephone Encounter (Signed)
Called and spoke with pt.  Pt aware of cxr results and requested a copy be mailed to her home address which i verified was correct.

## 2011-01-18 NOTE — Progress Notes (Signed)
Quick Note:  Spoke with patient-aware of results. ______ 

## 2011-03-23 ENCOUNTER — Other Ambulatory Visit: Payer: Self-pay | Admitting: Gastroenterology

## 2011-03-23 LAB — HM COLONOSCOPY

## 2011-04-29 LAB — DIFFERENTIAL
Eosinophils Absolute: 0
Eosinophils Relative: 0
Lymphocytes Relative: 11 — ABNORMAL LOW
Lymphs Abs: 0.8
Monocytes Absolute: 1
Monocytes Relative: 13 — ABNORMAL HIGH

## 2011-04-29 LAB — BASIC METABOLIC PANEL
BUN: 8
Chloride: 103
GFR calc non Af Amer: >60
Potassium: 3.5
Sodium: 138

## 2011-04-29 LAB — CBC
HCT: 41.6
Hemoglobin: 14.6
MCV: 85.7
WBC: 7.6

## 2011-05-20 ENCOUNTER — Ambulatory Visit (INDEPENDENT_AMBULATORY_CARE_PROVIDER_SITE_OTHER): Payer: Medicare Other | Admitting: Internal Medicine

## 2011-05-20 ENCOUNTER — Encounter: Payer: Self-pay | Admitting: Internal Medicine

## 2011-05-20 VITALS — BP 104/62 | HR 85 | Ht 66.0 in | Wt 199.4 lb

## 2011-05-20 DIAGNOSIS — J449 Chronic obstructive pulmonary disease, unspecified: Secondary | ICD-10-CM

## 2011-05-20 DIAGNOSIS — F410 Panic disorder [episodic paroxysmal anxiety] without agoraphobia: Secondary | ICD-10-CM

## 2011-05-20 DIAGNOSIS — Z0000 Encounter for general adult medical examination without abnormal findings: Secondary | ICD-10-CM

## 2011-05-20 MED ORDER — ALPRAZOLAM 0.5 MG PO TABS: 0.5000 mg | ORAL_TABLET | Freq: Three times a day (TID) | ORAL | Status: DC | PRN

## 2011-05-20 NOTE — Patient Instructions (Signed)
Order- [facility] referral to establish a primary doctor  Script for alprazolam   Please call as needed  I do recommend a flu shot for you.

## 2011-05-20 NOTE — Progress Notes (Signed)
Patient ID: Felicia Odom, female    DOB: [birth year], 55 y.o.   MRN: [id number]  HPI 12/31/10- 89 yo former smoker followed for COPD, Allergic rhinitis Last here October 01, 2010- note reviewed Now acute illness through family with febrile illness and cough over past 2 weeks. She had chills, raspy cough, productive yellow, tussive soreness. Not as sick now as last week. She took an old bottle of amoxacillin, several aspirin, and sudafed and her nebulizer/ albuterol, spiriva.   05/20/11-  33 yo former smoker followed for COPD, Allergic rhinitis Easy exertional dyspnea but nothing specific. She realizes interaction among menopausal symptoms, chronic anxiety and weight gain. Occasionally coughs a little green sputum with no blood, pleuritic or anginal chest pain. She is seeking a primary physician. Both parents died of heart failure and 2 sisters have had breast cancer.  Review of Systems Constitutional:   No-   weight loss, night sweats, fevers, chills, fatigue, lassitude. HEENT:   No-  headaches, difficulty swallowing, tooth/dental problems, sore throat,       No-  sneezing, itching, ear ache, nasal congestion, post nasal drip,  CV:  No-   chest pain, orthopnea, PND, swelling in lower extremities, anasarca, dizziness, palpitations Resp: No- acute shortness of breath with exertion or at rest.              Little productive cough,  No non-productive cough,  No- coughing up of blood.              No-   change in color of mucus.  No- wheezing.   Skin: No-   rash or lesions. GI:  No-   heartburn, indigestion, abdominal pain, nausea, vomiting, diarrhea,                 change in bowel habits, loss of appetite GU: No-   dysuria, change in color of urine, no urgency or frequency.  No- flank pain. MS:  No-   joint pain or swelling.  No- decreased range of motion.  No- back pain. Neuro-     nothing unusual Psych:  No- change in mood or affect. Chronic depression or anxiety.  No memory  loss.       Objective:   Physical Exam General- Alert, Oriented, Affect-appropriate, Distress- none acute Skin- rash-none, lesions- none, excoriation- none Lymphadenopathy- none Head- atraumatic            Eyes- Gross vision intact, PERRLA, conjunctivae clear secretions            Ears- Hearing, canals-normal            Nose- Clear, no-Septal dev, mucus, polyps, erosion, perforation             Throat- Mallampati II , mucosa clear , drainage- none, tonsils- atrophic Neck- flexible , trachea midline, no stridor , thyroid nl, carotid no bruit Chest - symmetrical excursion , unlabored           Heart/CV- RRR , no murmur , no gallop  , no rub, nl s1 s2                           - JVD- none , edema- none, stasis changes- none, varices- none           Lung- clear to P&A, wheeze- none, cough- none , dullness-none, rub- none           Chest wall-  Abd- tender-no, distended-no, bowel  sounds-present, HSM- no Br/ Gen/ Rectal- Not done, not indicated Extrem- cyanosis- none, clubbing, none, atrophy- none, strength- nl Neuro- grossly intact to observation

## 2011-05-22 NOTE — Assessment & Plan Note (Signed)
Chronic anxiety. I agreed to refill alprazolam as a bridge until she can establish with a primary physician.

## 2011-05-22 NOTE — Assessment & Plan Note (Signed)
Weight gain and deconditioning likely have more to do with her exertional dyspnea than any obstructive airways disease.

## 2011-05-30 ENCOUNTER — Telehealth: Payer: Self-pay | Admitting: Internal Medicine

## 2011-05-30 MED ORDER — ALPRAZOLAM 1 MG PO TABS: 1.0000 mg | ORAL_TABLET | Freq: Three times a day (TID) | ORAL | Status: DC | PRN

## 2011-05-30 NOTE — Telephone Encounter (Signed)
Spoke with pt and advised okay to change to the 1 mg tablets per Dr Maple Hudson. Rx was called to [facility] [address].

## 2011-05-30 NOTE — Telephone Encounter (Signed)
Ok to change script to alprazolam 1 mg, # 90, 1 tab, 3 times daily prn anxiety

## 2011-05-30 NOTE — Telephone Encounter (Signed)
LMTCB

## 2011-05-30 NOTE — Telephone Encounter (Signed)
I spoke with pt and she states she is wanting to go back to alprazolam 1 mg. Pt states when she was last in to see Dr. Maple Hudson she was given an rx for alprazolam 0.5 mg. Pt states she is having to take 2 0.5 mg tablets and would just prefer to go back to the 1mg . Pt is requesting a new rx be called [facility] [address]. Please advise Dr. Maple Hudson, thanks  Carver Fila, CMA

## 2011-06-08 ENCOUNTER — Other Ambulatory Visit: Payer: Self-pay | Admitting: Gastroenterology

## 2011-06-08 DIAGNOSIS — R1011 Right upper quadrant pain: Secondary | ICD-10-CM

## 2011-06-28 ENCOUNTER — Telehealth: Payer: Self-pay | Admitting: Internal Medicine

## 2011-06-28 MED ORDER — ALPRAZOLAM 1 MG PO TABS: 1.0000 mg | ORAL_TABLET | Freq: Three times a day (TID) | ORAL | Status: DC | PRN

## 2011-06-28 NOTE — Telephone Encounter (Signed)
Ok to refill alprazolam 1 mg, # 270, 1 tab 3 times daily as needed= 3 months, ref x3 if allowed.

## 2011-06-28 NOTE — Telephone Encounter (Signed)
Pt checking on status of Rx.  Pt's son is w/ her right now from [place] & is able to take pt to pick up Rx.  Please call pt when ready.  Antionette Fairy

## 2011-06-28 NOTE — Telephone Encounter (Signed)
Refill sent. Pt aware.Kal Chait, CMA  

## 2011-06-28 NOTE — Telephone Encounter (Signed)
Pt requesting a refill on her alprazolam 1 mg #90 days supply. Please advise Dr. Maple Hudson, thanks

## 2011-07-06 ENCOUNTER — Telehealth: Payer: Self-pay | Admitting: Internal Medicine

## 2011-07-06 ENCOUNTER — Other Ambulatory Visit: Payer: Medicare Other

## 2011-07-06 NOTE — Telephone Encounter (Signed)
ATC number provided, NA and no option to leave msg, WCB.

## 2011-07-06 NOTE — Telephone Encounter (Signed)
Called and spoke with pt.  Pt c/o chills/sweats, coughing up green sputum, green nasal drainage, body aches, and increased sob.  Offered pt an appt with Cy tomorrow.  Pt agreed to come in and be seen.  Pt scheduled to see CY tomorrow at 10:15am but was instructed to seek emergency care tonight if symptoms worsened. Pt verbalized understanding and agreed to this.

## 2011-07-07 ENCOUNTER — Ambulatory Visit (INDEPENDENT_AMBULATORY_CARE_PROVIDER_SITE_OTHER): Payer: Medicare Other | Admitting: Internal Medicine

## 2011-07-07 ENCOUNTER — Encounter: Payer: Self-pay | Admitting: Internal Medicine

## 2011-07-07 VITALS — BP 114/80 | HR 99 | Temp 96.9°F | Ht 66.0 in | Wt 194.4 lb

## 2011-07-07 DIAGNOSIS — J449 Chronic obstructive pulmonary disease, unspecified: Secondary | ICD-10-CM

## 2011-07-07 DIAGNOSIS — J019 Acute sinusitis, unspecified: Secondary | ICD-10-CM

## 2011-07-07 MED ORDER — METHYLPREDNISOLONE ACETATE 80 MG/ML IJ SUSP
80.0000 mg | Freq: Once | INTRAMUSCULAR | Status: AC
  Administered 2011-07-07: 80 mg via INTRAMUSCULAR

## 2011-07-07 MED ORDER — DOXYCYCLINE HYCLATE 100 MG PO TABS: ORAL_TABLET | ORAL | Status: DC

## 2011-07-07 NOTE — Progress Notes (Signed)
Addended by: Reynaldo Minium C on: 07/07/2011 04:55 PM   Modules accepted: Orders

## 2011-07-07 NOTE — Assessment & Plan Note (Signed)
Acute upper respiratory infection with bronchitis. We will manage with doxycycline Depo-Medrol Mucinex and fluids.

## 2011-07-07 NOTE — Progress Notes (Signed)
Patient ID: Felicia Odom, female    DOB: Sep 26, 1955, 55 y.o.   MRN: [id number]  HPI 12/31/10- 76 yo former smoker followed for COPD, Allergic rhinitis Last here October 01, 2010- note reviewed Now acute illness through family with febrile illness and cough over past 2 weeks. She had chills, raspy cough, productive yellow, tussive soreness. Not as sick now as last week. She took an old bottle of amoxacillin, several aspirin, and sudafed and her nebulizer/ albuterol, spiriva.   05/20/11-  89 yo former smoker followed for COPD, Allergic rhinitis Easy exertional dyspnea but nothing specific. She realizes interaction among menopausal symptoms, chronic anxiety and weight gain. Occasionally coughs a little green sputum with no blood, pleuritic or anginal chest pain. She is seeking a primary physician. Both parents died of heart failure and 2 sisters have had breast cancer.  07/07/11-  67 yo former smoker followed for COPD, Allergic rhinitis Acute visit-she reports a 2 days now of illness beginning with fever on the first couple days myalgias, cough with green sputum now becoming dry, right ear pressure, nasal congestion. Denies headache, bloody discharge, swollen glands, rash, GI upset. CXR- 12/31/10- IMPRESSION:  Mild changes of chronic bronchitis and/or asthma. No acute  cardiopulmonary disease.  Original Report Authenticated By: Arnell Sieving, M.D.     Review of Systems Constitutional:   No-   weight loss, night sweats, fevers, chills, fatigue, lassitude. HEENT:   No-  headaches, difficulty swallowing, tooth/dental problems, sore throat,       No-  sneezing, itching,     +ear ache, nasal congestion, post nasal drip,  CV:  No-   chest pain, orthopnea, PND, swelling in lower extremities, anasarca, dizziness, palpitations Resp: No- acute shortness of breath with exertion or at rest.              + productive cough,  + non-productive cough,  No- coughing up of blood.             +  change in color of mucus.  No- wheezing.   Skin: No-   rash or lesions. GI:  No-   heartburn, indigestion, abdominal pain, nausea, vomiting, diarrhea,                 change in bowel habits, loss of appetite GU: . MS: . Neuro-     Psych:    Objective:   Physical Exam General- Alert, Oriented, Affect-appropriate, Distress- none acute Skin- rash-none, lesions- none, excoriation- none Lymphadenopathy- none Head- atraumatic            Eyes- Gross vision intact, PERRLA, conjunctivae clear secretions            Ears- Hearing, canals-normal            Nose- green mucus bridging, no-Septal dev,  polyps, erosion, perforation             Throat- Mallampati II , mucosa clear , drainage- none, tonsils- atrophic Neck- flexible , trachea midline, no stridor , thyroid nl, carotid no bruit Chest - symmetrical excursion , unlabored           Heart/CV- RRR , no murmur , no gallop  , no rub, nl s1 s2                           - JVD- none , edema- none, stasis changes- none, varices- none  Lung-dry but harsh cough, dullness-none, rub- none           Chest wall-  Abd- tender-no, distended-no, bowel sounds-present, HSM- no Br/ Gen/ Rectal- Not done, not indicated Extrem- cyanosis- none, clubbing, none, atrophy- none, strength- nl Neuro- grossly intact to observation

## 2011-07-07 NOTE — Assessment & Plan Note (Signed)
Acute bronchitis as part of upper respiratory/lower respiratory infection. This probably began as a viral syndrome. We will cover for now with doxycycline, Mucinex, Depo-Medrol injection and encouraged supportive care including fluids.

## 2011-07-07 NOTE — Patient Instructions (Signed)
Depo 80  Script sent for antibiotic  Mucinex with plenty of fluids will help thin the mucus

## 2011-08-22 ENCOUNTER — Ambulatory Visit: Payer: Medicare Other | Admitting: Internal Medicine

## 2011-08-29 ENCOUNTER — Telehealth: Payer: Self-pay | Admitting: Internal Medicine

## 2011-08-29 NOTE — Telephone Encounter (Signed)
ATC pt at home #.  Line rang x 8 with no answer.  WCB.

## 2011-08-30 ENCOUNTER — Telehealth: Payer: Self-pay | Admitting: Internal Medicine

## 2011-08-30 DIAGNOSIS — J449 Chronic obstructive pulmonary disease, unspecified: Secondary | ICD-10-CM

## 2011-08-30 NOTE — Telephone Encounter (Signed)
Ok to refill her neb solution and ask [facility] to hook her up with new DME to replace [facility].

## 2011-08-30 NOTE — Telephone Encounter (Signed)
Pt returned call. Felicia Odom °

## 2011-08-30 NOTE — Telephone Encounter (Signed)
lmomtcb x1 

## 2011-08-30 NOTE — Telephone Encounter (Signed)
Error.  Closed previous note by accident.  Below is copied.  Reason for Call     New dme company           Call Documentation     Felicia Odom 08/30/2011 3:23 PM Signed  Pt returned triage's call. Pt stated to try to reach her at [phone] if we can not reach on home number. Felicia Odom   [place], [place] 08/30/2011 9:52 AM Signed  lmomtcb x1 Arman Filter, LPN, LPN 07/27/2438 1:02 PM Signed  ATC pt at home #. Line rang x 8 with no answer. WCB.           Encounter MyChart Messages     No messages in this encounter         Routing History        From  To  Priority    08/29/2011 3:02 PM  Felicia Odom  P LBPU TRIAGE POOL  Routine    08/29/2011 1:02 PM  Felicia Odom  P LBPU TRIAGE POOL  Routine       Created by     Lehman Prom on 08/29/2011 1:00 PM                           Visit Pharmacy     [id number] [place], [place] - [address]         Contacts         Type  Contact  Phone    08/29/2011 1:00 PM  Phone (Incoming)  [name], [name] (Self)  [phone] (H)    Patient states [employer] will no longer cover albuterol because of insurance. Patient is asking to be referred somewhere else.

## 2011-08-30 NOTE — Telephone Encounter (Signed)
Pt returned triage's call.  Pt stated to try to reach her at [phone] if we can not reach on home number.  Felicia Odom

## 2011-08-30 NOTE — Telephone Encounter (Signed)
Pt states with her new insurance [employer], [facility] can no longer provide her nebulizer medications. She has been using Duoneb for years and says she has enough for about 1 week. Order is needed for new DME company and neb med. Her new ID # is [id number]. Pls advise.No Known Allergies

## 2011-08-31 ENCOUNTER — Telehealth: Payer: Self-pay | Admitting: Internal Medicine

## 2011-08-31 MED ORDER — IPRATROPIUM BROMIDE 0.02 % IN SOLN: 500.0000 ug | Freq: Four times a day (QID) | RESPIRATORY_TRACT | Status: DC

## 2011-08-31 MED ORDER — ALBUTEROL SULFATE (2.5 MG/3ML) 0.083% IN NEBU: 2.5000 mg | INHALATION_SOLUTION | Freq: Four times a day (QID) | RESPIRATORY_TRACT | Status: DC | PRN

## 2011-08-31 NOTE — Telephone Encounter (Signed)
Called and spoke with pt. Pt states DME was changed from [facility] to [facility].  Pt states [facility] called her and want to charge her 20% for neb meds.  Pt states she lives off of $900 monthly and cannot afford this.  Plus, pt states she has Medicare, Medicaid and [employer] and never had to "pay a penny through [facility]" so she is confused why she would have to pay with [facility].  Pt is wanting more clarification on this. Felicia Odom if she needs to switch to a different DME company ? Or get neb meds at local pharmacy?  PCCs, can you please help with this.  Thanks!!!

## 2011-08-31 NOTE — Telephone Encounter (Signed)
Prescriptions printed and placed on [facility] cart for signature. PCC's will need to send this with the orider to the new DME company and pt needs nebulizer meds ASAP.  Pt is aware we are working on this for her.

## 2011-09-01 NOTE — Telephone Encounter (Signed)
lmomtcb x1 

## 2011-09-01 NOTE — Telephone Encounter (Signed)
i can not do anything about the cost of her neb meds maybe you can call them into a local pharmacy and she can use her medicaid

## 2011-09-02 MED ORDER — IPRATROPIUM BROMIDE 0.02 % IN SOLN: 500.0000 ug | Freq: Four times a day (QID) | RESPIRATORY_TRACT | Status: DC

## 2011-09-02 MED ORDER — ALBUTEROL SULFATE (2.5 MG/3ML) 0.083% IN NEBU: 2.5000 mg | INHALATION_SOLUTION | Freq: Four times a day (QID) | RESPIRATORY_TRACT | Status: DC | PRN

## 2011-09-02 NOTE — Telephone Encounter (Signed)
Called and spoke with pt and she stated that the home care company is going to charge her too much money for the neb meds.  [facility] [address]  These rx have been sent in for her.  Pt is aware.

## 2011-09-02 NOTE — Telephone Encounter (Signed)
Pt returning call Felicia Odom  °

## 2011-09-02 NOTE — Telephone Encounter (Signed)
lmomctb x2 for pt

## 2011-09-06 ENCOUNTER — Telehealth: Payer: Self-pay | Admitting: Internal Medicine

## 2011-09-06 NOTE — Telephone Encounter (Signed)
Per pt's chart, neb medication too expensive thru DME > [facility] [address] [facility]  Called spoke with patient, informed her of this.  Pt verbalized her understanding and wanted to know who to contact when she needs a new machine.  I informed her that the issue was not with the machine but the medications - when she needs a new neb machine contact [facility].  Pt okay with this and verbalized her understanding.  Nothing further needed.

## 2011-09-14 ENCOUNTER — Telehealth: Payer: Self-pay | Admitting: Internal Medicine

## 2011-09-14 MED ORDER — AZITHROMYCIN 250 MG PO TABS: ORAL_TABLET | ORAL | Status: AC

## 2011-09-14 NOTE — Telephone Encounter (Signed)
Called and spoke with pt.  Onset: 2 days ago.  C/o coughing up green to yellow colored sputum, increased sob, hoarseness, sore throat and body aches.  Denies f/c/s wheezing or tightness in chest.  Requesting abx.  Cy, please advise.  Thanks! No Known Allergies

## 2011-09-14 NOTE — Telephone Encounter (Signed)
Pt stated she is returning someone's call.  Felicia Odom

## 2011-09-14 NOTE — Telephone Encounter (Signed)
Per CY-okay to give Zpak #1 take as directed no refills. Sent Rx to [facility] [address]. Pt aware.

## 2011-09-28 ENCOUNTER — Telehealth: Payer: Self-pay | Admitting: Internal Medicine

## 2011-09-28 DIAGNOSIS — J449 Chronic obstructive pulmonary disease, unspecified: Secondary | ICD-10-CM

## 2011-09-28 NOTE — Telephone Encounter (Signed)
Called spoke with patient who stated that she would like to transfer her nebs to a mail-order pharmacy that delivers and would also like to use the duoneb that is already mixed rather than mixing the separate meds herself.  Pt is aware her insurance may not cover the combo medication.  Dr Maple Hudson may patient have her meds changed?  She is okay with a call back tomorrow.  Thanks.

## 2011-09-29 MED ORDER — IPRATROPIUM-ALBUTEROL 0.5-2.5 (3) MG/3ML IN SOLN: 3.0000 mL | Freq: Four times a day (QID) | RESPIRATORY_TRACT | Status: DC | PRN

## 2011-09-29 NOTE — Telephone Encounter (Signed)
DME order placed, meds changed and MAR updated.  Pt is aware.  Will sign off.

## 2011-09-29 NOTE — Telephone Encounter (Signed)
OK to d/c her separate med orders for albuterol 0.083% and for ipratropium/ atrovent neb solution New script for duoneb albuterol plus ipratropium neb solution, # 360, 1 by neb every 6 hours if needed, refill x 3

## 2011-09-30 ENCOUNTER — Telehealth: Payer: Self-pay | Admitting: Internal Medicine

## 2011-09-30 DIAGNOSIS — J45909 Unspecified asthma, uncomplicated: Secondary | ICD-10-CM

## 2011-09-30 DIAGNOSIS — J449 Chronic obstructive pulmonary disease, unspecified: Secondary | ICD-10-CM

## 2011-09-30 NOTE — Telephone Encounter (Signed)
ATC patient to let her know that I have sent PCC's an order to get her established with [facility] or [facility] for her neb/rx's. Was unable to leave a message and no answer on patients phone. Will need to try again later to reach patient regarding this message.

## 2011-10-03 NOTE — Telephone Encounter (Signed)
Spoke with pt and notified of recs per [employer]. Pt verbalized understanding and states nothing further needed.

## 2011-10-06 ENCOUNTER — Ambulatory Visit (INDEPENDENT_AMBULATORY_CARE_PROVIDER_SITE_OTHER): Payer: Medicare Other | Admitting: Internal Medicine

## 2011-10-06 ENCOUNTER — Encounter: Payer: Self-pay | Admitting: *Deleted

## 2011-10-06 ENCOUNTER — Encounter: Payer: Self-pay | Admitting: Internal Medicine

## 2011-10-06 VITALS — BP 146/80 | HR 85 | Ht 66.0 in | Wt 199.8 lb

## 2011-10-06 DIAGNOSIS — J4489 Other specified chronic obstructive pulmonary disease: Secondary | ICD-10-CM

## 2011-10-06 DIAGNOSIS — F410 Panic disorder [episodic paroxysmal anxiety] without agoraphobia: Secondary | ICD-10-CM

## 2011-10-06 DIAGNOSIS — J449 Chronic obstructive pulmonary disease, unspecified: Secondary | ICD-10-CM

## 2011-10-06 MED ORDER — AMOXICILLIN 500 MG PO CAPS: ORAL_CAPSULE | ORAL | Status: DC

## 2011-10-06 NOTE — Progress Notes (Signed)
Patient ID: Felicia Odom, female    DOB: 07-Nov-1955, 56 y.o.   MRN: [id number]  HPI 12/31/10- 63 yo former smoker followed for COPD, Allergic rhinitis Last here October 01, 2010- note reviewed Now acute illness through family with febrile illness and cough over past 2 weeks. She had chills, raspy cough, productive yellow, tussive soreness. Not as sick now as last week. She took an old bottle of amoxacillin, several aspirin, and sudafed and her nebulizer/ albuterol, spiriva.   05/20/11-  87 yo former smoker followed for COPD, Allergic rhinitis Easy exertional dyspnea but nothing specific. She realizes interaction among menopausal symptoms, chronic anxiety and weight gain. Occasionally coughs a little green sputum with no blood, pleuritic or anginal chest pain. She is seeking a primary physician. Both parents died of heart failure and 2 sisters have had breast cancer.  10/06/11- 69 yo former smoker followed for COPD, Allergic rhinitis Mood be off of estrogen. Asks handicapped parking. Asks note for excused absence from classes at [employer]. Has had a cold/bronchitis syndrome with chills, fever over the last 2 weeks. We called in a Z-Pak. Sputum is still productive yellow green. Complains of tussive pain left lateral ribs. CXR- 01/18/11- reviewed images w/ her IMPRESSION:  Mild changes of chronic bronchitis and/or asthma. No acute  cardiopulmonary disease.  Original Report Authenticated By: Arnell Sieving, M.D.   Review of Systems- see HPI Constitutional:   No-   weight loss, night sweats, fevers, chills, fatigue, lassitude. HEENT:   No-  headaches, difficulty swallowing, tooth/dental problems, sore throat,       No-  sneezing, itching, ear ache, nasal congestion, post nasal drip,  CV:  No-   chest pain, orthopnea, PND, swelling in lower extremities, anasarca, dizziness, palpitations Resp: No- acute shortness of breath with exertion or at rest.              Little productive cough,  No  non-productive cough,  No- coughing up of blood.              No-   change in color of mucus.  No- wheezing.   Skin: No-   rash or lesions. GI:  No-   heartburn, indigestion, abdominal pain, nausea, vomiting,  GU:  MS:  No-   joint pain or swelling.  . Neuro-     nothing unusual Psych: +change in mood or affect. +Chronic depression or anxiety.  No memory loss.   Objective:   Physical Exam General- Alert, Oriented, Affect- anxious/ tearfull, Distress- emotional Skin- rash-none, lesions- none, excoriation- none Lymphadenopathy- none Head- atraumatic            Eyes- Gross vision intact, PERRLA, conjunctivae clear secretions            Ears- Hearing, canals-normal            Nose- Crusting, no-Septal dev,, polyps, erosion, perforation             Throat- Mallampati II , mucosa clear , drainage- none, tonsils- atrophic Neck- flexible , trachea midline, no stridor , thyroid nl, carotid no bruit Chest - symmetrical excursion , unlabored           Heart/CV- RRR , no murmur , no gallop  , no rub, nl s1 s2                           - JVD- none , edema- none, stasis changes- none, varices- none  Lung- clear to P&A, wheeze- none, cough- none , dullness-none, rub- none           Chest wall-  Abd-  Br/ Gen/ Rectal- Not done, not indicated Extrem- cyanosis- none, clubbing, none, atrophy- none, strength- nl Neuro- grossly intact to observation

## 2011-10-06 NOTE — Patient Instructions (Signed)
Note- for School- Has been under my care for an acute illness. In my opinion she can continue school without problem.  Handicapped Parking   Prescription sent for amoxacillin

## 2011-10-09 ENCOUNTER — Encounter: Payer: Self-pay | Admitting: Internal Medicine

## 2011-10-09 NOTE — Assessment & Plan Note (Signed)
Very anxious and tearful now, blaming discontinuation of her estrogen by her GYN.

## 2011-10-09 NOTE — Assessment & Plan Note (Signed)
Recent bronchitic exacerbation, probably viral triggered. Plan handicapped parking. Note for school absence. Amoxicillin.

## 2011-10-28 ENCOUNTER — Other Ambulatory Visit (HOSPITAL_COMMUNITY): Payer: Self-pay | Admitting: Obstetrics & Gynecology

## 2011-10-28 DIAGNOSIS — Z1231 Encounter for screening mammogram for malignant neoplasm of breast: Secondary | ICD-10-CM

## 2011-11-28 ENCOUNTER — Ambulatory Visit (HOSPITAL_COMMUNITY)
Admission: RE | Admit: 2011-11-28 | Discharge: 2011-11-28 | Disposition: A | Discharge status: Home / Self Care | Payer: Medicare Other | Source: Ambulatory Visit | Attending: Obstetrics & Gynecology | Admitting: Obstetrics & Gynecology

## 2011-11-28 ENCOUNTER — Other Ambulatory Visit: Payer: Medicare Other

## 2011-11-28 DIAGNOSIS — Z1231 Encounter for screening mammogram for malignant neoplasm of breast: Secondary | ICD-10-CM | POA: Insufficient documentation

## 2011-11-30 ENCOUNTER — Other Ambulatory Visit: Payer: Self-pay | Admitting: Internal Medicine

## 2011-12-17 ENCOUNTER — Encounter: Payer: Self-pay | Admitting: Internal Medicine

## 2011-12-17 DIAGNOSIS — Z114 Encounter for screening for human immunodeficiency virus [HIV]: Secondary | ICD-10-CM | POA: Insufficient documentation

## 2011-12-29 ENCOUNTER — Ambulatory Visit: Payer: Medicare Other | Admitting: Internal Medicine

## 2011-12-29 DIAGNOSIS — Z0289 Encounter for other administrative examinations: Secondary | ICD-10-CM

## 2012-01-05 ENCOUNTER — Ambulatory Visit: Payer: Medicare Other | Admitting: Internal Medicine

## 2012-01-09 ENCOUNTER — Telehealth: Payer: Self-pay | Admitting: Internal Medicine

## 2012-01-09 MED ORDER — ALPRAZOLAM 1 MG PO TABS: 1.0000 mg | ORAL_TABLET | Freq: Three times a day (TID) | ORAL | Status: DC | PRN

## 2012-01-09 MED ORDER — ALBUTEROL SULFATE HFA 108 (90 BASE) MCG/ACT IN AERS: 2.0000 | INHALATION_SPRAY | RESPIRATORY_TRACT | Status: DC | PRN

## 2012-01-09 NOTE — Telephone Encounter (Signed)
Per [facility] Rxs called into pharmacy, [facility] [address] Pt aware that medications are at pharmacy and ready to be picked up.  Xanax 1mg  #270  1po tid prn   0-r/f  Albuterol ProAir 2 puffs 4 times daily as needed  11-r/f Seward Meth, C. Medical Assistant

## 2012-01-09 NOTE — Telephone Encounter (Signed)
SPOKE WITH PT ..REQUESTING XANAX 1MG   1 TID  NEEDS A 90 DAY SUPPLY HAS AN APPT ON 7 12 13. ALSO REQUESTING ALBUTEROL INHALER. No Known Allergies DR YOUNG IS THIS OK TO FILL THANK YOU@!

## 2012-02-03 ENCOUNTER — Ambulatory Visit: Payer: Self-pay | Admitting: Internal Medicine

## 2012-02-14 ENCOUNTER — Ambulatory Visit: Payer: Self-pay | Admitting: Internal Medicine

## 2012-02-16 ENCOUNTER — Encounter: Payer: Self-pay | Admitting: Internal Medicine

## 2012-02-16 ENCOUNTER — Ambulatory Visit (INDEPENDENT_AMBULATORY_CARE_PROVIDER_SITE_OTHER): Payer: Medicare Other | Admitting: Internal Medicine

## 2012-02-16 VITALS — BP 110/64 | HR 83 | Ht 66.5 in | Wt 193.6 lb

## 2012-02-16 DIAGNOSIS — F410 Panic disorder [episodic paroxysmal anxiety] without agoraphobia: Secondary | ICD-10-CM

## 2012-02-16 DIAGNOSIS — J4489 Other specified chronic obstructive pulmonary disease: Secondary | ICD-10-CM

## 2012-02-16 DIAGNOSIS — J449 Chronic obstructive pulmonary disease, unspecified: Secondary | ICD-10-CM

## 2012-02-16 MED ORDER — CLONAZEPAM 0.5 MG PO TABS: ORAL_TABLET | ORAL | Status: DC

## 2012-02-16 NOTE — Patient Instructions (Addendum)
Try script for clonazepam to help with sleep as needed
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
## 2012-02-22 NOTE — Assessment & Plan Note (Signed)
I. can't tell that breathing is much change. There is some mild bronchitis. Anxiety is harder for her to cope with this

## 2012-02-22 NOTE — Assessment & Plan Note (Addendum)
Xanax is not lasting well. She has tried using it for sleep but wakes repeatedly during the night. Disrupted sleep is aggravating her sense of fatigue and difficulty coping with her bronchitis. She does not have professional mental health at this time. We discussed this. She denies being suicidal. Plan-trial of longer-lasting, more even medication clonazepam to help with sleep.

## 2012-02-23 ENCOUNTER — Telehealth: Payer: Self-pay | Admitting: Internal Medicine

## 2012-02-23 DIAGNOSIS — L989 Disorder of the skin and subcutaneous tissue, unspecified: Secondary | ICD-10-CM

## 2012-02-23 NOTE — Telephone Encounter (Signed)
Ok to refer to Dermatologist Dr Londell Moh for dx ???? "rash"

## 2012-02-23 NOTE — Telephone Encounter (Signed)
Spoke with pt. She states that her Dermatologist, Dr. Londell Moh is now needing "permission" from [facility] for her to be seen there. She states that she now has medicaid and this is the reason. I called and spoke with receptionist and she states that they just need a referral for the pt to be seen due to new insurance. She has no PCP. Are you okay with this Dr Maple Hudson? Please advise, thanks!

## 2012-02-23 NOTE — Telephone Encounter (Signed)
Spoke with pt and notified of recs per [facility]. Order was sent to [facility].

## 2012-02-29 ENCOUNTER — Other Ambulatory Visit: Payer: Self-pay | Admitting: Internal Medicine

## 2012-03-01 NOTE — Telephone Encounter (Signed)
CY-please advise if okay to refill. Thanks.  

## 2012-03-01 NOTE — Telephone Encounter (Signed)
Ok to fill these?

## 2012-04-02 ENCOUNTER — Ambulatory Visit (INDEPENDENT_AMBULATORY_CARE_PROVIDER_SITE_OTHER): Payer: Medicare Other | Admitting: Internal Medicine

## 2012-04-02 ENCOUNTER — Encounter: Payer: Self-pay | Admitting: Internal Medicine

## 2012-04-02 VITALS — BP 112/66 | HR 80 | Ht 66.5 in | Wt 193.6 lb

## 2012-04-02 DIAGNOSIS — J449 Chronic obstructive pulmonary disease, unspecified: Secondary | ICD-10-CM

## 2012-04-02 DIAGNOSIS — J4489 Other specified chronic obstructive pulmonary disease: Secondary | ICD-10-CM

## 2012-04-02 DIAGNOSIS — F410 Panic disorder [episodic paroxysmal anxiety] without agoraphobia: Secondary | ICD-10-CM

## 2012-04-02 MED ORDER — BENZONATATE 200 MG PO CAPS: 200.0000 mg | ORAL_CAPSULE | Freq: Three times a day (TID) | ORAL | Status: AC | PRN

## 2012-04-02 MED ORDER — AMOXICILLIN 500 MG PO CAPS: ORAL_CAPSULE | ORAL | Status: DC

## 2012-04-02 MED ORDER — ALPRAZOLAM 1 MG PO TABS: 1.0000 mg | ORAL_TABLET | Freq: Four times a day (QID) | ORAL | Status: DC | PRN

## 2012-04-02 NOTE — Patient Instructions (Addendum)
Script Benzonatate/ Tessalon 200 mg  For cough  Script Xanax/ alprazolam- take the fewest each day that you can get by with  script for amoxacillin for bronchitis
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
## 2012-04-09 NOTE — Assessment & Plan Note (Signed)
We discussed use of Xanax up to 4 times daily if really necessary as she tries to finish formal schooling. Risk benefit considerations were very carefully discussed

## 2012-04-09 NOTE — Assessment & Plan Note (Signed)
Acute exacerbation of bronchitis. She wants to wait on flu vaccine. Plan-amoxicillin, Tessalon, fluids.

## 2012-05-18 ENCOUNTER — Ambulatory Visit: Payer: Medicare Other | Admitting: Internal Medicine

## 2012-05-28 ENCOUNTER — Other Ambulatory Visit: Payer: Self-pay | Admitting: Internal Medicine

## 2012-06-14 ENCOUNTER — Telehealth: Payer: Self-pay | Admitting: Internal Medicine

## 2012-06-14 NOTE — Telephone Encounter (Signed)
lmomtcb  

## 2012-06-14 NOTE — Telephone Encounter (Signed)
Pt returned call. Felicia Odom  

## 2012-06-14 NOTE — Telephone Encounter (Signed)
Pt has an appt tomorrow morning with CY. Pt c/o sinus congestion, and nasal congestion x 2 days. Pt denies any SOB. I advised the pt best to keep appt tomorrow and be evaluated. Pt states understanding.  Carron Curie, CMA

## 2012-06-15 ENCOUNTER — Encounter: Payer: Self-pay | Admitting: Internal Medicine

## 2012-06-15 ENCOUNTER — Ambulatory Visit (INDEPENDENT_AMBULATORY_CARE_PROVIDER_SITE_OTHER): Payer: Medicare Other | Admitting: Internal Medicine

## 2012-06-15 VITALS — BP 120/78 | HR 76 | Ht 66.5 in | Wt 198.4 lb

## 2012-06-15 DIAGNOSIS — M25539 Pain in unspecified wrist: Secondary | ICD-10-CM

## 2012-06-15 DIAGNOSIS — F410 Panic disorder [episodic paroxysmal anxiety] without agoraphobia: Secondary | ICD-10-CM

## 2012-06-15 DIAGNOSIS — J449 Chronic obstructive pulmonary disease, unspecified: Secondary | ICD-10-CM

## 2012-06-15 DIAGNOSIS — M79609 Pain in unspecified limb: Secondary | ICD-10-CM

## 2012-06-15 DIAGNOSIS — M25531 Pain in right wrist: Secondary | ICD-10-CM

## 2012-06-15 DIAGNOSIS — M79646 Pain in unspecified finger(s): Secondary | ICD-10-CM

## 2012-06-15 MED ORDER — DOXYCYCLINE HYCLATE 100 MG PO TABS: 100.0000 mg | ORAL_TABLET | Freq: Two times a day (BID) | ORAL | Status: DC

## 2012-06-15 NOTE — Patient Instructions (Addendum)
Order- refer to Dr Sypher/ Ortho   Pain R wrist and thumb   Script doxycycline antibiotic to hold
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
## 2012-06-20 DIAGNOSIS — M25531 Pain in right wrist: Secondary | ICD-10-CM | POA: Insufficient documentation

## 2012-06-20 NOTE — Assessment & Plan Note (Signed)
Much improved since last visit. This is close to her baseline.

## 2012-06-20 NOTE — Assessment & Plan Note (Signed)
School is a major stressor, but she is getting through. Acceptable use of xanax so far- cautious use again discussed.

## 2012-06-20 NOTE — Assessment & Plan Note (Signed)
P- refer to orthopedics for evaluation

## 2012-06-22 ENCOUNTER — Telehealth: Payer: Self-pay | Admitting: Internal Medicine

## 2012-06-22 NOTE — Telephone Encounter (Signed)
My understanding is that she is [place] [employer] and therefor I am not permitted to refer her. She needs to ask her PCP to do that.

## 2012-06-22 NOTE — Telephone Encounter (Signed)
i sent the office note what else can we do

## 2012-06-25 NOTE — Telephone Encounter (Signed)
Pt has regular medicaid, not [employer], therefore, we can refer. I'm not sure what else Dr. Stark Jock office is needing. I will call and inquire. Rhonda J Cobb

## 2012-06-25 NOTE — Telephone Encounter (Signed)
I have called Dr. Stark Jock office and left a message for [place] [employer] to call me with any additional information she needed. Rhonda J Cobb

## 2012-06-26 NOTE — Telephone Encounter (Signed)
Called and left another message for Tammy W at Dr. Stark Jock office to return my call as to why appointment was cancelled. Rhonda J Cobb

## 2012-06-27 NOTE — Telephone Encounter (Signed)
Tammy from Dr Budd Palmer office called this am,the reason why they cancelled pt appt she says is pt is 56 and not 66  therefore Dr Teressa Senter needs to eval office notes before appt. Then they will call to sched appt. She says they do not have OV notes.  I faxed office notes to att Lilyan Punt @ [phone] .Kandice Hams

## 2012-07-24 ENCOUNTER — Other Ambulatory Visit: Payer: Self-pay | Admitting: *Deleted

## 2012-07-24 ENCOUNTER — Telehealth: Payer: Self-pay | Admitting: Internal Medicine

## 2012-07-24 MED ORDER — ALPRAZOLAM 1 MG PO TABS: 1.0000 mg | ORAL_TABLET | Freq: Three times a day (TID) | ORAL | Status: DC | PRN

## 2012-07-24 NOTE — Telephone Encounter (Signed)
Per Cy-- time to reduce the number #90 3 refills, 1 q8hrs prn .  Spoke w patient, patient aware rx has been sent in to [facility]  Nothing further needed at this time.

## 2012-07-24 NOTE — Telephone Encounter (Signed)
Spoke with patient, patient would like to know if she can refill xanax Rx while son is here so that he can pick them up for her.  Last filled #120 3 refills on [facility]/02/12. [facility] Last OV: 06/15/12 with 6 month follow up.  Dr. Maple Hudson please advise if this is ok to send in, thank you!

## 2012-07-27 ENCOUNTER — Telehealth: Payer: Self-pay | Admitting: Internal Medicine

## 2012-07-27 NOTE — Telephone Encounter (Signed)
I had agreed to increase the xanax when she said the concern was getting through the end of semester exams. It is too easy to get dependent on xanax, and to build a tolerance, needing more to get the same effect. To protect her from this we need to back off the dosing from 4 to 3 daily. If she can get by with 2 on some days, then she can set a couple aside for days when she feels she needs 4.

## 2012-07-27 NOTE — Telephone Encounter (Signed)
Called spoke with patient, advised of CY's recs as stated below.  Pt verbalized her understanding and stated that she will try to use less xanax.  Nothing further needed; will sign off.

## 2012-07-27 NOTE — Telephone Encounter (Signed)
Per phone msg from 07/24/12:  Lazarus Salines, RN 07/24/2012 2:10 PM Signed  Per Cy-- time to reduce the number #90 3 refills, 1 q8hrs prn .  Spoke w patient, patient aware rx has been [facility]  Nothing further needed at this time.  -----  Called, spoke with pt.  Pt states approx 3 months ago Dr. Maple Hudson increased xanax to qid.  Pt states she wasn't informed that she needs to decrease this to q8h prn.  Pt states she is going through [employer] and her brother in law in dying with CA.  Reports she needs the xanax qid.  Pt states she does not have a PCP.  I was in the process of asking her if she has a OB/GYN who is treating her menapause when the call was disconnected.  I atc pt back x 3 but NA.  She is wanting further clarification on this and to why she should decrease the xanax now.  Pt states right now she really needs this qid and requesting further recs from Dr. Maple Hudson.  Pls advise.  Thank you.

## 2012-08-01 ENCOUNTER — Other Ambulatory Visit: Payer: Self-pay | Admitting: Internal Medicine

## 2012-08-01 MED ORDER — ESOMEPRAZOLE MAGNESIUM 40 MG PO CPDR: 40.0000 mg | DELAYED_RELEASE_CAPSULE | Freq: Every day | ORAL | Status: DC

## 2012-08-28 ENCOUNTER — Telehealth: Payer: Self-pay | Admitting: Internal Medicine

## 2012-08-28 NOTE — Telephone Encounter (Signed)
Cy, please advise if you are okay with refill. Pt last seen 05-2012.

## 2012-08-29 MED ORDER — IBUPROFEN 800 MG PO TABS: 800.0000 mg | ORAL_TABLET | Freq: Two times a day (BID) | ORAL | Status: DC | PRN

## 2012-08-29 NOTE — Telephone Encounter (Signed)
Ibuprofen rx sent to [facility] [address]  Lmomtcb to inform pt.

## 2012-08-29 NOTE — Telephone Encounter (Signed)
Ok to refill ibuprofen

## 2012-09-05 ENCOUNTER — Other Ambulatory Visit: Payer: Self-pay | Admitting: Internal Medicine

## 2012-09-05 NOTE — Telephone Encounter (Signed)
Refill only until next ov.

## 2012-09-05 NOTE — Telephone Encounter (Signed)
Please advise if okay to refill. Thanks.  

## 2012-11-06 ENCOUNTER — Other Ambulatory Visit: Payer: Self-pay | Admitting: Internal Medicine

## 2012-11-20 ENCOUNTER — Other Ambulatory Visit: Payer: Self-pay | Admitting: Internal Medicine

## 2012-11-20 NOTE — Telephone Encounter (Signed)
[facility]buprofen 800 mg [facility] for # 30 x 3

## 2012-11-28 ENCOUNTER — Telehealth: Payer: Self-pay | Admitting: Internal Medicine

## 2012-11-28 NOTE — Telephone Encounter (Signed)
Called pt x's 3 to make next ov per recall.  Pt never returned calls.  Mailed recall letter 11/28/12. Felicia Odom °

## 2012-11-30 ENCOUNTER — Other Ambulatory Visit (HOSPITAL_COMMUNITY): Payer: Self-pay | Admitting: Obstetrics & Gynecology

## 2012-11-30 DIAGNOSIS — Z1231 Encounter for screening mammogram for malignant neoplasm of breast: Secondary | ICD-10-CM

## 2012-12-04 ENCOUNTER — Ambulatory Visit (HOSPITAL_COMMUNITY)
Admission: RE | Admit: 2012-12-04 | Discharge: 2012-12-04 | Disposition: A | Discharge status: Home / Self Care | Payer: Medicare Other | Source: Ambulatory Visit | Attending: Obstetrics & Gynecology | Admitting: Obstetrics & Gynecology

## 2012-12-04 DIAGNOSIS — Z1231 Encounter for screening mammogram for malignant neoplasm of breast: Secondary | ICD-10-CM

## 2012-12-06 ENCOUNTER — Ambulatory Visit (INDEPENDENT_AMBULATORY_CARE_PROVIDER_SITE_OTHER): Payer: Medicare Other | Admitting: Internal Medicine

## 2012-12-06 ENCOUNTER — Encounter: Payer: Self-pay | Admitting: Internal Medicine

## 2012-12-06 VITALS — BP 122/74 | HR 75 | Ht 66.5 in | Wt 197.0 lb

## 2012-12-06 DIAGNOSIS — J449 Chronic obstructive pulmonary disease, unspecified: Secondary | ICD-10-CM

## 2012-12-06 DIAGNOSIS — F410 Panic disorder [episodic paroxysmal anxiety] without agoraphobia: Secondary | ICD-10-CM

## 2012-12-06 MED ORDER — AMPHETAMINE-DEXTROAMPHETAMINE 10 MG PO TABS: 10.0000 mg | ORAL_TABLET | Freq: Every day | ORAL | Status: DC

## 2012-12-06 NOTE — Patient Instructions (Addendum)
Script adderall for occasional use if needed
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
## 2012-12-16 NOTE — Assessment & Plan Note (Signed)
Reactive airways component well controlled as she remains off of cigarettes.

## 2012-12-16 NOTE — Assessment & Plan Note (Signed)
I allowed Xanax for while around exam time but will not refill now. I agreed to let her try low-dose Adderall temporarily to assess effect after careful discussion. Several in her family have benefited from it.

## 2012-12-24 ENCOUNTER — Other Ambulatory Visit: Payer: Self-pay | Admitting: Internal Medicine

## 2012-12-24 NOTE — Telephone Encounter (Signed)
Ok to refill 

## 2012-12-24 NOTE — Telephone Encounter (Signed)
Please advise if okay to refill. Thanks.  

## 2013-01-12 ENCOUNTER — Other Ambulatory Visit: Payer: Self-pay | Admitting: Internal Medicine

## 2013-01-24 ENCOUNTER — Ambulatory Visit (INDEPENDENT_AMBULATORY_CARE_PROVIDER_SITE_OTHER): Payer: Medicare Other | Admitting: Internal Medicine

## 2013-01-24 ENCOUNTER — Encounter: Payer: Self-pay | Admitting: Internal Medicine

## 2013-01-24 VITALS — BP 100/70 | HR 90 | Ht 66.0 in | Wt 198.2 lb

## 2013-01-24 DIAGNOSIS — F410 Panic disorder [episodic paroxysmal anxiety] without agoraphobia: Secondary | ICD-10-CM

## 2013-01-24 DIAGNOSIS — J309 Allergic rhinitis, unspecified: Secondary | ICD-10-CM

## 2013-01-24 DIAGNOSIS — J449 Chronic obstructive pulmonary disease, unspecified: Secondary | ICD-10-CM

## 2013-01-24 MED ORDER — AMOXICILLIN-POT CLAVULANATE 875-125 MG PO TABS: 1.0000 | ORAL_TABLET | Freq: Two times a day (BID) | ORAL | Status: DC

## 2013-01-24 MED ORDER — AMPHETAMINE-DEXTROAMPHETAMINE 10 MG PO TABS: 10.0000 mg | ORAL_TABLET | Freq: Every day | ORAL | Status: DC

## 2013-01-24 MED ORDER — IPRATROPIUM-ALBUTEROL 0.5-2.5 (3) MG/3ML IN SOLN: 3.0000 mL | Freq: Four times a day (QID) | RESPIRATORY_TRACT | Status: DC | PRN

## 2013-01-24 NOTE — Patient Instructions (Addendum)
Script for augmentin antibiotic for sinus infection  Order- [facility] needs help with DME vs retail source for her nebulizer medication and supplies
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
## 2013-02-09 NOTE — Assessment & Plan Note (Signed)
Anxiety and emotional stress compounded by menopause. She is asking low-dose Adderall trial to "help her focus" and we discussed this carefully. I can let her try short-term, but I don't think it will make her life better and I explained that.

## 2013-02-09 NOTE — Assessment & Plan Note (Signed)
Asthma with bronchitis. Plan-finished Augmentin. Maintain thrush prophylaxis.

## 2013-02-09 NOTE — Assessment & Plan Note (Signed)
Seasonal, generally controlled with antihistamine

## 2013-02-22 ENCOUNTER — Telehealth: Payer: Self-pay | Admitting: Internal Medicine

## 2013-02-22 MED ORDER — ALPRAZOLAM 1 MG PO TABS: 1.0000 mg | ORAL_TABLET | Freq: Three times a day (TID) | ORAL | Status: DC | PRN

## 2013-02-22 NOTE — Telephone Encounter (Signed)
Xanax last refilled 07/24/12 #90 x 3 refills Last OV 01/24/13 Pending 03/28/13 Please advise CDY thanks

## 2013-02-22 NOTE — Telephone Encounter (Signed)
I have called RX into the pharmacy. Pt is aware. Nothing further was needed

## 2013-02-22 NOTE — Telephone Encounter (Signed)
Ok to refill xanax 

## 2013-03-04 ENCOUNTER — Telehealth: Payer: Self-pay | Admitting: Internal Medicine

## 2013-03-04 NOTE — Telephone Encounter (Signed)
Chief of Staff and phone note on CY's cart.

## 2013-03-06 ENCOUNTER — Encounter: Payer: Self-pay | Admitting: Internal Medicine

## 2013-03-06 NOTE — Telephone Encounter (Signed)
Done

## 2013-03-07 NOTE — Telephone Encounter (Signed)
Left message on voicemail that jury excusable letter with jury duty form is at for pick up.

## 2013-03-28 ENCOUNTER — Encounter: Payer: Self-pay | Admitting: Internal Medicine

## 2013-03-28 ENCOUNTER — Ambulatory Visit (INDEPENDENT_AMBULATORY_CARE_PROVIDER_SITE_OTHER): Payer: Medicare Other | Admitting: Internal Medicine

## 2013-03-28 VITALS — BP 90/60 | HR 88 | Ht 66.0 in | Wt 198.8 lb

## 2013-03-28 DIAGNOSIS — J449 Chronic obstructive pulmonary disease, unspecified: Secondary | ICD-10-CM

## 2013-03-28 DIAGNOSIS — F410 Panic disorder [episodic paroxysmal anxiety] without agoraphobia: Secondary | ICD-10-CM

## 2013-03-28 MED ORDER — AMPHETAMINE-DEXTROAMPHETAMINE 10 MG PO TABS: 10.0000 mg | ORAL_TABLET | Freq: Every day | ORAL | Status: DC

## 2013-03-28 NOTE — Patient Instructions (Addendum)
Script refill adderall as discussed  Walk a lot and skip the carbs- bread and potatoes
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
## 2013-04-06 ENCOUNTER — Encounter: Payer: Self-pay | Admitting: Internal Medicine

## 2013-04-06 NOTE — Assessment & Plan Note (Signed)
Chronic cough consistent with a mild tracheobronchitis. Watching for postnasal drip, bronchospasm and reflux.

## 2013-04-06 NOTE — Assessment & Plan Note (Signed)
Adderall was discussed carefully again .did stabilize mood and help her focus

## 2013-04-08 ENCOUNTER — Telehealth: Payer: Self-pay | Admitting: Internal Medicine

## 2013-04-08 MED ORDER — CLONAZEPAM 0.5 MG PO TABS: ORAL_TABLET | ORAL | Status: DC

## 2013-04-08 NOTE — Telephone Encounter (Signed)
Per CY-okay to refill as before.

## 2013-04-08 NOTE — Telephone Encounter (Signed)
Spoke with patient, patient is aware Rx has been called in to verified pharmacy Nothing further needed at this time.

## 2013-04-08 NOTE — Telephone Encounter (Signed)
Last OV 03/28/13 Pending OV 05/28/13 Last fill 09/05/12 #60 with 3 additional refills  No Known Allergies  CY - please advise on refill. Thanks.

## 2013-04-10 ENCOUNTER — Other Ambulatory Visit: Payer: Self-pay | Admitting: Internal Medicine

## 2013-04-11 NOTE — Telephone Encounter (Signed)
Please advise if okay to send refill for this RX or would you like pt to go through PCP from now on? Thanks.

## 2013-04-11 NOTE — Telephone Encounter (Signed)
Ok refill for 6 months

## 2013-04-29 ENCOUNTER — Telehealth: Payer: Self-pay | Admitting: Internal Medicine

## 2013-04-29 MED ORDER — AMPHETAMINE-DEXTROAMPHETAMINE 10 MG PO TABS: 10.0000 mg | ORAL_TABLET | Freq: Every day | ORAL | Status: DC

## 2013-04-29 NOTE — Telephone Encounter (Signed)
Spoke with patient; aware that Rx is at front for pick up-pt states she will pick up today around 1:00pm.

## 2013-04-29 NOTE — Telephone Encounter (Signed)
RX printed and placed on CDY cart for signature.

## 2013-05-07 ENCOUNTER — Telehealth: Payer: Self-pay | Admitting: Internal Medicine

## 2013-05-07 NOTE — Telephone Encounter (Signed)
Last OV with CDY: 03/28/13; f/u in 2 months Clonazepam 0.5 rx called in on 04/08/13 #60 x 3. Called [facility] [address], spoke with Jill Alexanders.  Was advised pt does have a total of 3 clonazepam refills remaining.  He will have rx prepared for pt to pick up. Called, spoke with pt.  Informed [facility] of [facility].  She verbalized understanding and thought she would need to call d/t the new law with with hydrocodone.  Nothing further needed per pt.

## 2013-05-08 ENCOUNTER — Ambulatory Visit (INDEPENDENT_AMBULATORY_CARE_PROVIDER_SITE_OTHER): Payer: Medicare Other | Admitting: Internal Medicine

## 2013-05-08 ENCOUNTER — Ambulatory Visit (INDEPENDENT_AMBULATORY_CARE_PROVIDER_SITE_OTHER)
Admission: RE | Admit: 2013-05-08 | Discharge: 2013-05-08 | Disposition: A | Discharge status: Home / Self Care | Payer: Medicare Other | Source: Ambulatory Visit | Attending: Internal Medicine | Admitting: Internal Medicine

## 2013-05-08 ENCOUNTER — Telehealth: Payer: Self-pay | Admitting: Internal Medicine

## 2013-05-08 ENCOUNTER — Encounter: Payer: Self-pay | Admitting: Internal Medicine

## 2013-05-08 ENCOUNTER — Other Ambulatory Visit: Payer: Medicare Other

## 2013-05-08 VITALS — BP 122/70 | HR 75 | Ht 66.0 in | Wt 196.8 lb

## 2013-05-08 DIAGNOSIS — I700 Atherosclerosis of aorta: Secondary | ICD-10-CM

## 2013-05-08 DIAGNOSIS — R079 Chest pain, unspecified: Secondary | ICD-10-CM

## 2013-05-08 DIAGNOSIS — R0789 Other chest pain: Secondary | ICD-10-CM

## 2013-05-08 DIAGNOSIS — K219 Gastro-esophageal reflux disease without esophagitis: Secondary | ICD-10-CM

## 2013-05-08 DIAGNOSIS — F410 Panic disorder [episodic paroxysmal anxiety] without agoraphobia: Secondary | ICD-10-CM

## 2013-05-08 DIAGNOSIS — G8929 Other chronic pain: Secondary | ICD-10-CM | POA: Insufficient documentation

## 2013-05-08 HISTORY — DX: Atherosclerosis of aorta: I70.0

## 2013-05-08 HISTORY — DX: Other chest pain: R07.89

## 2013-05-08 NOTE — Telephone Encounter (Signed)
Discussed w/ nurse: Recommend patient: Take her xanax up to every 8 hours as needed for anxiety. This will relax her so she breathes more easily. Take Tylenol if needed for pain- not more than two every 6 hours today. Resume Spiriva inhaler once daily Heating pad for chest may feel good Maalox or Mylanta to coat esophagus If pain doesn't stop in an hour, or gets worse call us back.

## 2013-05-08 NOTE — Assessment & Plan Note (Signed)
Probably reflux espophagitis, aggravated by too much aspirin, with radiation to R arm.   -Plan- educated on ASA and heart burn. Take her nexium, supplement with liquid antacid. R/O MI/ PE with CXR, D-dimer, Troponin, EKG

## 2013-05-08 NOTE — Telephone Encounter (Signed)
Per CY-lets get patient in today; Felicia Odom called her and patient is on her way now-will take about 30 minutes to get here.

## 2013-05-08 NOTE — Assessment & Plan Note (Signed)
Acute anxiety.

## 2013-05-08 NOTE — Progress Notes (Signed)
Quick Note:  Advised pt of labs per CY. Pt verbalized understanding ______

## 2013-05-08 NOTE — Telephone Encounter (Signed)
Last OV 03-28-13. Spoke with the pt and she states last night she began to have a heartburn sensation in the center of her chest as well as SOB. She described the SOB as difficulty getting a full breath in. She also states she had a shooting pain under her right arm down to her right elbow. She states at this time she took 8 aspirin 81mg  tablets without relief. She states she then began to feel dizzy at this time so she went to bed. She states the pain continued so she took more aspirin, totaling 17 tablets in all. I advised the pt DO NOT TAKE anymore aspirin. Pt states the pain is some better this morning but is still worse when she takes a deep breath and is located in the center of her chest. Pt also states that she feels like her "breathing just is not right." Please advise. Carron Curie, CMA No Known Allergies

## 2013-05-08 NOTE — Telephone Encounter (Signed)
Advised pt of recommendations per CY.  Pt advised to call before 4 if no improvement.
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
## 2013-05-08 NOTE — Telephone Encounter (Signed)
Called pt with lab results per CY Pt states is still SOB and having pain in middle of chest Request further resources  Please advise CY

## 2013-05-08 NOTE — Patient Instructions (Signed)
Order- EKG- dx chest pain              CXR- dx chest pain              Lab- D-dimer, Troponin I        Dx chest pain  For now- take Nexium twice daily before meals until this settles down, then take it once daily.                 Take a liquid antacid like Maalox or Mylanta to coat your esophagus when ever you get heartburn  You took too much aspirin - that can burn your digestive tract.

## 2013-05-08 NOTE — Assessment & Plan Note (Signed)
Acute esophagitis Plan - nexium, liquids, avoid overdosing Aspirin

## 2013-05-09 LAB — D-DIMER, QUANTITATIVE: D-Dimer, Quant: 0.53 ug/mL-FEU — ABNORMAL HIGH (ref 0.00–0.48)

## 2013-05-09 NOTE — Progress Notes (Signed)
Quick Note:  Advised pt of labs per CY. Pt verbalized understanding. She is feeling better today and will call if symptoms reoccur ______

## 2013-05-10 ENCOUNTER — Other Ambulatory Visit: Payer: Self-pay | Admitting: Internal Medicine

## 2013-05-10 NOTE — Telephone Encounter (Signed)
Ok to refill x 3 

## 2013-05-10 NOTE — Telephone Encounter (Signed)
CY, Please advise if you are okay with this refill.Thanks.

## 2013-05-20 ENCOUNTER — Telehealth: Payer: Self-pay | Admitting: Internal Medicine

## 2013-05-20 NOTE — Telephone Encounter (Signed)
Spoke to pt. Advised her that she should have refill on this as we called this in on 02/22/13 with 3 additional refills. States that she thought that there was a new law and that she couldn't get those additional refills anymore. I explained to her that this law is for a completely different medication. She agreed and verbalized understanding. She will contact her pharmacy for her refill.

## 2013-05-28 ENCOUNTER — Ambulatory Visit: Payer: Medicare Other | Admitting: Internal Medicine

## 2013-05-28 ENCOUNTER — Telehealth: Payer: Self-pay | Admitting: Internal Medicine

## 2013-05-28 NOTE — Telephone Encounter (Signed)
Spoke with pt and appt r/s. Nothing further needed 

## 2013-05-30 ENCOUNTER — Ambulatory Visit: Payer: Medicare Other | Admitting: Internal Medicine

## 2013-06-03 ENCOUNTER — Telehealth: Payer: Self-pay | Admitting: Internal Medicine

## 2013-06-03 MED ORDER — AMPHETAMINE-DEXTROAMPHETAMINE 10 MG PO TABS: 10.0000 mg | ORAL_TABLET | Freq: Every day | ORAL | Status: DC

## 2013-06-03 NOTE — Telephone Encounter (Signed)
Last OV 05/08/13 No pending OV  Last fill 04/29/13  No Known Allergies  Current Outpatient Prescriptions on File Prior to Visit  Medication Sig Dispense Refill  . ALPRAZolam (XANAX) 1 MG tablet Take 1 tablet (1 mg total) by mouth every 8 (eight) hours as needed for anxiety.  90 tablet  3  . amphetamine-dextroamphetamine (ADDERALL) 10 MG tablet Take 1 tablet (10 mg total) by mouth daily.  30 tablet  0  . clonazePAM (KLONOPIN) 0.5 MG tablet 1-2 tablets for sleep as needed  60 tablet  3  . Fluticasone-Salmeterol (ADVAIR DISKUS) 500-50 MCG/DOSE AEPB Inhale 1 puff into the lungs every 12 (twelve) hours.        Marland Kitchen HYDROcodone-acetaminophen (NORCO) 10-325 MG per tablet Take 1 tablet by mouth every 6 (six) hours as needed.      Marland Kitchen ibuprofen (ADVIL,MOTRIN) 800 MG tablet TAKE 1 TABLET BY MOUTH 2 TIMES DAILY WITH FOOD AS NEEDED FOR PAIN  60 tablet  3  . ipratropium-albuterol (DUONEB) 0.5-2.5 (3) MG/3ML SOLN Take 3 mLs by nebulization every 6 (six) hours as needed. Dx: 496  360 mL  prn  . NEXIUM 40 MG capsule TAKE 1 CAPSULE (40 MG TOTAL) BY MOUTH DAILY BEFORE BREAKFAST.  30 capsule  2  . PROAIR HFA 108 (90 BASE) MCG/ACT inhaler INHALE 2 PUFFS INTO THE LUNGS EVERY 4 (FOUR) HOURS AS NEEDED.  8.5 each  6  . tiotropium (SPIRIVA) 18 MCG inhalation capsule Place 18 mcg into inhaler and inhale daily.        . [DISCONTINUED] estradiol (VIVELLE-DOT) 0.0375 MG/24HR Place 1 patch onto the skin 2 (two) times a week.        . [DISCONTINUED] ipratropium (ATROVENT) 0.02 % nebulizer solution Take 2.5 mLs (500 mcg total) by nebulization 4 (four) times daily. DX:  496  300 mL  5  . [DISCONTINUED] progesterone (PROMETRIUM) 100 MG capsule Take 100 mg by mouth daily.         No current facility-administered medications on file prior to visit.   CY - please advise on refill. Thanks.

## 2013-06-03 NOTE — Telephone Encounter (Signed)
Pt is aware that rx is ready for pick up. Nothing further was needed. 

## 2013-06-03 NOTE — Telephone Encounter (Signed)
RX printed and will placed on CDY cart for signature. Will forward to [place] to f/u on once done

## 2013-06-03 NOTE — Telephone Encounter (Signed)
Ok to refill Adderall 10 mg, pick up script

## 2013-06-24 ENCOUNTER — Telehealth: Payer: Self-pay | Admitting: Internal Medicine

## 2013-06-24 MED ORDER — ALPRAZOLAM 1 MG PO TABS: 1.0000 mg | ORAL_TABLET | Freq: Three times a day (TID) | ORAL | Status: DC | PRN
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
## 2013-06-24 NOTE — Telephone Encounter (Signed)
Per CY-okay this time #120 NO refills.

## 2013-06-24 NOTE — Telephone Encounter (Signed)
I called and made pt aware will call in RX. This has been called in. Nothing further needed

## 2013-06-25 ENCOUNTER — Telehealth: Payer: Self-pay | Admitting: Internal Medicine

## 2013-06-25 ENCOUNTER — Other Ambulatory Visit: Payer: Self-pay | Admitting: Internal Medicine

## 2013-06-25 NOTE — Telephone Encounter (Signed)
Last OV 05/08/13 No pending OV Last fill 06/03/13 #30  CY - please advise on refill. Thanks.

## 2013-06-25 NOTE — Telephone Encounter (Signed)
Per CY-can not refill at this time; will need to be at least a week-Rx needs to last a month. Pt is aware and will call back next week for RX.

## 2013-07-04 ENCOUNTER — Telehealth: Payer: Self-pay | Admitting: Internal Medicine

## 2013-07-04 MED ORDER — AZITHROMYCIN 250 MG PO TABS: ORAL_TABLET | ORAL | Status: AC

## 2013-07-04 MED ORDER — PROMETHAZINE-CODEINE 6.25-10 MG/5ML PO SYRP: 5.0000 mL | ORAL_SOLUTION | Freq: Four times a day (QID) | ORAL | Status: DC | PRN

## 2013-07-04 NOTE — Telephone Encounter (Signed)
Waymon Budge, MD at 07/04/2013 2:54 PM     Status: Signed        Ok to Rx Z pak  Prometh codeine 120 ml, 1 tsp every 6 hours if needed for cough  Can use Mucinex DM  We can't refill clonazepam this soon. Needs to wait.

## 2013-07-04 NOTE — Telephone Encounter (Addendum)
(  message closed in error) Pt c/o increased cough, SOB, wheezing, chest and head congestion with green mucus x 2 days.  Pt states that she has been using OTC cough syrup--little relief.  Pt states that she has been using double treatments of nebs.  Requests abx and cough syrup to be called into [facility]   Also requesting refill of her Klonopin .5mg  (last filled 04/08/13 with 3 refills)--- pt should have 1 refill left to have filled but pt states that she has none left.     Medication List             ADVAIR DISKUS 500-50 MCG/DOSE Aepb  Generic drug:  Fluticasone-Salmeterol  Inhale 1 puff into the lungs every 12 (twelve) hours.     ALPRAZolam 1 MG tablet  Commonly known as:  XANAX  Take 1 tablet (1 mg total) by mouth every 8 (eight) hours as needed for anxiety.     amphetamine-dextroamphetamine 10 MG tablet  Commonly known as:  ADDERALL  Take 1 tablet (10 mg total) by mouth daily.     clonazePAM 0.5 MG tablet  Commonly known as:  KLONOPIN  1-2 tablets for sleep as needed     HYDROcodone-acetaminophen 10-325 MG per tablet  Commonly known as:  NORCO  Take 1 tablet by mouth every 6 (six) hours as needed.     ibuprofen 800 MG tablet  Commonly known as:  ADVIL,MOTRIN  TAKE 1 TABLET BY MOUTH 2 TIMES DAILY WITH FOOD AS NEEDED FOR PAIN     ipratropium-albuterol 0.5-2.5 (3) MG/3ML Soln  Commonly known as:  DUONEB  Take 3 mLs by nebulization every 6 (six) hours as needed. Dx: 496     NEXIUM 40 MG capsule  Generic drug:  esomeprazole  TAKE 1 CAPSULE (40 MG TOTAL) BY MOUTH DAILY BEFORE BREAKFAST.     NEXIUM 40 MG capsule  Generic drug:  esomeprazole  TAKE 1 CAPSULE (40 MG TOTAL) BY MOUTH DAILY BEFORE BREAKFAST.     PROAIR HFA 108 (90 BASE) MCG/ACT inhaler  Generic drug:  albuterol  INHALE 2 PUFFS INTO THE LUNGS EVERY 4 (FOUR) HOURS AS NEEDED.     tiotropium 18 MCG inhalation capsule  Commonly known as:  SPIRIVA  Place 18 mcg into inhaler and inhale daily.        No  Known Allergies  Please advise Dr Maple Hudson. Thanks.

## 2013-07-04 NOTE — Addendum Note (Signed)
Addended by: Caryl Ada on: 07/04/2013 04:41 PM   Modules accepted: Orders

## 2013-07-04 NOTE — Telephone Encounter (Signed)
Pt is aware of CY recs. Both rx's have been called into the pharmacy. Nothing further was needed.

## 2013-07-04 NOTE — Telephone Encounter (Signed)
Pt returned call.  Pt is also requesting a cough medicine be called in as well.  Felicia Odom

## 2013-07-04 NOTE — Telephone Encounter (Signed)
lmtcb x1 

## 2013-07-05 ENCOUNTER — Telehealth: Payer: Self-pay | Admitting: Internal Medicine

## 2013-07-05 MED ORDER — ANTIPYRINE-BENZOCAINE 5.4-1.4 % OT SOLN: OTIC | Status: DC

## 2013-07-05 NOTE — Telephone Encounter (Signed)
Offer Auralgan ( antipyrine/benzocaine otc, AB otic) ear drops    1 vial      1-2 drops in affected ear with cotton ball, twice daily

## 2013-07-05 NOTE — Telephone Encounter (Signed)
I called and spoke with pt. She c/o right ear pain that hurts deep down. She was giving zpak and cough syrup yesterday. She has not taken tylenol bc she reports this does not help. She wants something called in for this. Please advise Dr. Maple Hudson thanks  No Known Allergies

## 2013-07-05 NOTE — Telephone Encounter (Signed)
Spoke with patient-aware of rx from CY and will get over the weekend. Nothing more needed at this time.

## 2013-07-08 ENCOUNTER — Telehealth: Payer: Self-pay | Admitting: Internal Medicine

## 2013-07-08 MED ORDER — AMPHETAMINE-DEXTROAMPHETAMINE 10 MG PO TABS: 10.0000 mg | ORAL_TABLET | Freq: Every day | ORAL | Status: DC

## 2013-07-08 NOTE — Telephone Encounter (Signed)
RX printed and placed on Dr. Roxy Cedar cart for signature. Please advise once done thanks

## 2013-07-08 NOTE — Telephone Encounter (Signed)
Pt returned call to check on status of Rx.  Dr. Maple Hudson gave me the signed Rx & I have place in an envelope up front for pt to pick up. I advised pt Rx is ready for her to p/u.  Pt verbalized understanding & states nothing further needed at this time.   Felicia Odom

## 2013-07-12 ENCOUNTER — Telehealth: Payer: Self-pay | Admitting: Internal Medicine

## 2013-07-12 MED ORDER — CEFDINIR 300 MG PO CAPS: 300.0000 mg | ORAL_CAPSULE | Freq: Two times a day (BID) | ORAL | Status: DC

## 2013-07-12 NOTE — Telephone Encounter (Signed)
It sounds as if we need a different antibiotic. Suggest cefdinir 300 mg, # 14, 1 twice daily

## 2013-07-12 NOTE — Telephone Encounter (Signed)
Called and spoke with Felicia Odom and she stated that she is too sick to come in for appt today.  She stated that she finished the zpak and all of the cough syrup that was given to her on 07/04/13.  She stated that she has a very deep cough with green sputum now.  She has been doing her breathing tx about 4-6 times per day.  She stated that her fever did break after taking the zpak but she feels bad still.  Felicia Odom is requesting recs from CY.  Please advise. Thanks  No Known Allergies    Current Outpatient Prescriptions on File Prior to Visit  Medication Sig Dispense Refill  . ALPRAZolam (XANAX) 1 MG tablet Take 1 tablet (1 mg total) by mouth every 8 (eight) hours as needed for anxiety.  120 tablet  0  . amphetamine-dextroamphetamine (ADDERALL) 10 MG tablet Take 1 tablet (10 mg total) by mouth daily.  30 tablet  0  . antipyrine-benzocaine (AURALGAN) otic solution 1-2 drops in affected ear with cotton ball twice daily  10 mL  0  . clonazePAM (KLONOPIN) 0.5 MG tablet 1-2 tablets for sleep as needed  60 tablet  3  . Fluticasone-Salmeterol (ADVAIR DISKUS) 500-50 MCG/DOSE AEPB Inhale 1 puff into the lungs every 12 (twelve) hours.        Marland Kitchen HYDROcodone-acetaminophen (NORCO) 10-325 MG per tablet Take 1 tablet by mouth every 6 (six) hours as needed.      Marland Kitchen ibuprofen (ADVIL,MOTRIN) 800 MG tablet TAKE 1 TABLET BY MOUTH 2 TIMES DAILY WITH FOOD AS NEEDED FOR PAIN  60 tablet  3  . ipratropium-albuterol (DUONEB) 0.5-2.5 (3) MG/3ML SOLN Take 3 mLs by nebulization every 6 (six) hours as needed. Dx: 496  360 mL  prn  . NEXIUM 40 MG capsule TAKE 1 CAPSULE (40 MG TOTAL) BY MOUTH DAILY BEFORE BREAKFAST.  30 capsule  2  . NEXIUM 40 MG capsule TAKE 1 CAPSULE (40 MG TOTAL) BY MOUTH DAILY BEFORE BREAKFAST.  30 capsule  2  . PROAIR HFA 108 (90 BASE) MCG/ACT inhaler INHALE 2 PUFFS INTO THE LUNGS EVERY 4 (FOUR) HOURS AS NEEDED.  8.5 each  6  . promethazine-codeine (PHENERGAN WITH CODEINE) 6.25-10 MG/5ML syrup Take 5 mLs by mouth every 6  (six) hours as needed for cough.  120 mL  0  . tiotropium (SPIRIVA) 18 MCG inhalation capsule Place 18 mcg into inhaler and inhale daily.        . [DISCONTINUED] estradiol (VIVELLE-DOT) 0.0375 MG/24HR Place 1 patch onto the skin 2 (two) times a week.        . [DISCONTINUED] ipratropium (ATROVENT) 0.02 % nebulizer solution Take 2.5 mLs (500 mcg total) by nebulization 4 (four) times daily. DX:  496  300 mL  5  . [DISCONTINUED] progesterone (PROMETRIUM) 100 MG capsule Take 100 mg by mouth daily.         No current facility-administered medications on file prior to visit.

## 2013-07-12 NOTE — Telephone Encounter (Signed)
Called and spoke with pt and she is aware of CY recs.  Pt is aware that this has been sent to her pharmacy and nothing further is needed.

## 2013-07-22 ENCOUNTER — Encounter: Payer: Self-pay | Admitting: Internal Medicine

## 2013-07-22 ENCOUNTER — Ambulatory Visit (INDEPENDENT_AMBULATORY_CARE_PROVIDER_SITE_OTHER): Payer: Medicare Other | Admitting: Internal Medicine

## 2013-07-22 ENCOUNTER — Telehealth: Payer: Self-pay | Admitting: Pulmonary Disease

## 2013-07-22 VITALS — BP 126/80 | HR 93 | Ht 66.0 in | Wt 191.4 lb

## 2013-07-22 DIAGNOSIS — F410 Panic disorder [episodic paroxysmal anxiety] without agoraphobia: Secondary | ICD-10-CM

## 2013-07-22 DIAGNOSIS — J449 Chronic obstructive pulmonary disease, unspecified: Secondary | ICD-10-CM

## 2013-07-22 DIAGNOSIS — J4489 Other specified chronic obstructive pulmonary disease: Secondary | ICD-10-CM

## 2013-07-22 MED ORDER — ALPRAZOLAM 1 MG PO TABS: 1.0000 mg | ORAL_TABLET | Freq: Three times a day (TID) | ORAL | Status: DC | PRN

## 2013-07-22 MED ORDER — PROMETHAZINE-CODEINE 6.25-10 MG/5ML PO SYRP: 5.0000 mL | ORAL_SOLUTION | Freq: Four times a day (QID) | ORAL | Status: DC | PRN

## 2013-07-22 MED ORDER — ALPRAZOLAM 1 MG PO TABS: ORAL_TABLET | ORAL | Status: DC

## 2013-07-22 NOTE — Telephone Encounter (Signed)
Called, spoke with pt.  We have scheduled her to see CDY today at 4 pm.  Pt aware and voiced no further questions or concerns at this time.
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
## 2013-07-22 NOTE — Telephone Encounter (Signed)
Per CY-lets have patient come in today at 4:00pm slot to be seen. Thanks.

## 2013-07-22 NOTE — Telephone Encounter (Signed)
Pt c/o increased cough, congestion with light yellow/clear mucus, chest tightness, wheezing, SOB and "ear popping" x 2-3 weeks Pt states that she has completed Zpak course as of 07/12/13 and was given cefdinir 300 mg 07/12/13 for unresolved symptoms. Pt is requesting to be seen today if at all possible--pt states that she will also be in [place] tomorrow if we cannot squeeze her in today.   No Known Allergies   [facility]                                                     Medication List        ADVAIR DISKUS 500-50 MCG/DOSE Aepb  Generic drug:  Fluticasone-Salmeterol  Inhale 1 puff into the lungs every 12 (twelve) hours.     ALPRAZolam 1 MG tablet  Commonly known as:  XANAX  Take 1 tablet (1 mg total) by mouth every 8 (eight) hours as needed for anxiety.     amphetamine-dextroamphetamine 10 MG tablet  Commonly known as:  ADDERALL  Take 1 tablet (10 mg total) by mouth daily.     antipyrine-benzocaine otic solution  Commonly known as:  AURALGAN  1-2 drops in affected ear with cotton ball twice daily     cefdinir 300 MG capsule  Commonly known as:  OMNICEF  Take 1 capsule (300 mg total) by mouth 2 (two) times daily.     clonazePAM 0.5 MG tablet  Commonly known as:  KLONOPIN  1-2 tablets for sleep as needed     HYDROcodone-acetaminophen 10-325 MG per tablet  Commonly known as:  NORCO  Take 1 tablet by mouth every 6 (six) hours as needed.     ibuprofen 800 MG tablet  Commonly known as:  ADVIL,MOTRIN  TAKE 1 TABLET BY MOUTH 2 TIMES DAILY WITH FOOD AS NEEDED FOR PAIN     ipratropium-albuterol 0.5-2.5 (3) MG/3ML Soln  Commonly known as:  DUONEB  Take 3 mLs by nebulization every 6 (six) hours as needed. Dx: 496     NEXIUM 40 MG capsule  Generic drug:  esomeprazole  TAKE 1 CAPSULE (40 MG TOTAL) BY MOUTH DAILY BEFORE BREAKFAST.     NEXIUM 40 MG capsule  Generic drug:  esomeprazole  TAKE 1 CAPSULE (40 MG TOTAL) BY MOUTH DAILY BEFORE BREAKFAST.     PROAIR HFA 108 (90  BASE) MCG/ACT inhaler  Generic drug:  albuterol  INHALE 2 PUFFS INTO THE LUNGS EVERY 4 (FOUR) HOURS AS NEEDED.     promethazine-codeine 6.25-10 MG/5ML syrup  Commonly known as:  PHENERGAN with CODEINE  Take 5 mLs by mouth every 6 (six) hours as needed for cough.     tiotropium 18 MCG inhalation capsule  Commonly known as:  SPIRIVA  Place 18 mcg into inhaler and inhale daily.       Please advise Dr Maple Hudson. Thanks.

## 2013-07-22 NOTE — Telephone Encounter (Signed)
ATC PT LINE BUSY X 4 WCB 

## 2013-07-22 NOTE — Patient Instructions (Signed)
Try otc nasal saline spray as often as needed for nasal congestion  Try otc Sudafed-PE decongestant as needed to open your popping ear  Script refill xanax  Script for cough syrup

## 2013-07-28 ENCOUNTER — Other Ambulatory Visit: Payer: Self-pay | Admitting: Internal Medicine

## 2013-07-29 ENCOUNTER — Telehealth: Payer: Self-pay | Admitting: Internal Medicine

## 2013-07-29 NOTE — Telephone Encounter (Signed)
May refill this time only. This must last 2 months.

## 2013-07-29 NOTE — Telephone Encounter (Signed)
lmomtcb x1 

## 2013-07-29 NOTE — Telephone Encounter (Signed)
Pt seen CY on 07/22/13 and still no better.  Pt has appt with primary md this afternoon.  Explained to pt that CY has no openings in schedule today and advise her to have her primary md treat her for these symptoms.  Pt verbalized understanding.

## 2013-07-29 NOTE — Telephone Encounter (Signed)
Returning call can be reached at [phone].Felicia Odom

## 2013-07-29 NOTE — Telephone Encounter (Signed)
Last OV 07/22/13 Pending OV 11/21/13 Last fill 07/22/13 #13mL  CY - please advise on refill. Thanks.

## 2013-07-29 NOTE — Telephone Encounter (Signed)
Rx has been called in per CY. 

## 2013-08-05 ENCOUNTER — Other Ambulatory Visit: Payer: Self-pay | Admitting: Internal Medicine

## 2013-08-05 ENCOUNTER — Telehealth: Payer: Self-pay | Admitting: Internal Medicine

## 2013-08-05 MED ORDER — AMPHETAMINE-DEXTROAMPHETAMINE 10 MG PO TABS: 10.0000 mg | ORAL_TABLET | Freq: Every day | ORAL | Status: DC

## 2013-08-05 MED ORDER — CLONAZEPAM 0.5 MG PO TABS: ORAL_TABLET | ORAL | Status: DC

## 2013-08-05 NOTE — Telephone Encounter (Signed)
Spoke with pt.  She is requesting to pick up rxs for adderall 10 mg and clonazepam 0.5 mg Last OV with CDY: 07/22/13, f/u 4 months Pending OV with CY: 11/21/13 Adderall 10 mg #30 qd x 0 given on 07/08/13 Clonazepam 0.5 mg #60 1-2 qhs prn x 3 refills on 03/29/13. Dr. Annamaria Boots, pls advise if rxs are ok.  Thank you.

## 2013-08-05 NOTE — Telephone Encounter (Signed)
CY, Please advise if okay to refill. Thanks.  

## 2013-08-05 NOTE — Telephone Encounter (Signed)
rx printed and palced on CY cart to sign. Pt wants to pick-up when ready. Morehouse Bing, CMA

## 2013-08-05 NOTE — Telephone Encounter (Signed)
Ok to refill. I thought Joellen Jersey had this to take care of this AM, but maybe not.

## 2013-08-05 NOTE — Telephone Encounter (Signed)
Ok to refill both?? 

## 2013-08-05 NOTE — Telephone Encounter (Signed)
Patient states she can be reached at [phone]

## 2013-08-05 NOTE — Telephone Encounter (Signed)
Rx's signed by CY and placed at front for pick up-left message for patient about this.

## 2013-08-06 NOTE — Telephone Encounter (Signed)
Jury Letter and message placed on CY's cart.

## 2013-08-08 ENCOUNTER — Ambulatory Visit (INDEPENDENT_AMBULATORY_CARE_PROVIDER_SITE_OTHER): Payer: Medicare Other | Admitting: Internal Medicine

## 2013-08-08 ENCOUNTER — Ambulatory Visit (INDEPENDENT_AMBULATORY_CARE_PROVIDER_SITE_OTHER)
Admission: RE | Admit: 2013-08-08 | Discharge: 2013-08-08 | Disposition: A | Discharge status: Home / Self Care | Payer: Medicare Other | Source: Ambulatory Visit | Attending: Internal Medicine | Admitting: Internal Medicine

## 2013-08-08 ENCOUNTER — Encounter: Payer: Self-pay | Admitting: Internal Medicine

## 2013-08-08 ENCOUNTER — Telehealth: Payer: Self-pay | Admitting: Internal Medicine

## 2013-08-08 VITALS — BP 124/76 | HR 88 | Ht 66.0 in | Wt 190.6 lb

## 2013-08-08 DIAGNOSIS — J449 Chronic obstructive pulmonary disease, unspecified: Secondary | ICD-10-CM

## 2013-08-08 MED ORDER — HYDROCOD POLST-CHLORPHEN POLST 10-8 MG/5ML PO LQCR: ORAL | Status: DC

## 2013-08-08 MED ORDER — METHYLPREDNISOLONE 8 MG PO TABS: ORAL_TABLET | ORAL | Status: DC

## 2013-08-08 MED ORDER — LEVALBUTEROL HCL 0.63 MG/3ML IN NEBU
0.6300 mg | INHALATION_SOLUTION | Freq: Once | RESPIRATORY_TRACT | Status: AC
  Administered 2013-08-08: 0.63 mg via RESPIRATORY_TRACT

## 2013-08-08 MED ORDER — TRAMADOL HCL ER 100 MG PO TB24: 100.0000 mg | ORAL_TABLET | Freq: Three times a day (TID) | ORAL | Status: DC | PRN

## 2013-08-08 MED ORDER — PREDNISONE 5 MG PO TABS: 5.0000 mg | ORAL_TABLET | Freq: Every day | ORAL | Status: DC

## 2013-08-08 NOTE — Telephone Encounter (Signed)
Spoke with patient-her insurance covers meds but cough syrup was over $100 and the methylprednisolone Medrol taper was over $30; CY please advise.

## 2013-08-08 NOTE — Progress Notes (Signed)
Quick Note:  Pt aware of results. ______ 

## 2013-08-08 NOTE — Patient Instructions (Signed)
Script for tussionex cough syrup  Script sent for methylprednisolone Medrol taper  Order- CXR-- dx subacute bronchitis  Letter for jury duty
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
## 2013-08-08 NOTE — Telephone Encounter (Signed)
Letter has been completed by CY (a copy made for patient as well). Left message for patient that she can come by and pick up letter with jury notice at front desk. If she should have any questions or concerns to please call the office and speak directly with me.

## 2013-08-08 NOTE — Telephone Encounter (Signed)
Per CY-instead Tussionex give Tramadol 100 mg 330 1 every 8 hours prn no refills and instead of medrol give Prednisone 20 mg #5 take 1 po qd no refills. Thanks. This short burst should not cause lasting effects.

## 2013-08-08 NOTE — Telephone Encounter (Signed)
Pt is aware of CY's recs. Rx's have been called in.

## 2013-08-11 NOTE — Assessment & Plan Note (Signed)
Post viral bronchitis/cough pattern aggravated by anxiety. Discussed medication options. Plan-codeine cough syrup, agreed to refill Xanax

## 2013-08-11 NOTE — Assessment & Plan Note (Signed)
Significant chronic anxiety and depression

## 2013-08-13 ENCOUNTER — Telehealth: Payer: Self-pay | Admitting: Internal Medicine

## 2013-08-13 ENCOUNTER — Other Ambulatory Visit: Payer: Self-pay | Admitting: Internal Medicine

## 2013-08-13 MED ORDER — PROMETHAZINE-CODEINE 6.25-10 MG/5ML PO SYRP: ORAL_SOLUTION | ORAL | Status: DC

## 2013-08-13 NOTE — Telephone Encounter (Signed)
[facility]. Pt is aware. Nothing further needed

## 2013-08-13 NOTE — Telephone Encounter (Signed)
CY, please advise if okay to call in Promethazine with codeine cough syrup instead for patient; she also would like a larger bottle. Thanks.

## 2013-08-13 NOTE — Telephone Encounter (Signed)
Ok to refill 240 ml 1 teaspoon every 4-6 hours if needed for cough

## 2013-08-22 ENCOUNTER — Other Ambulatory Visit: Payer: Self-pay | Admitting: Internal Medicine

## 2013-08-22 ENCOUNTER — Telehealth: Payer: Self-pay | Admitting: Internal Medicine

## 2013-08-22 MED ORDER — PROMETHAZINE-CODEINE 6.25-10 MG/5ML PO SYRP: ORAL_SOLUTION | ORAL | Status: DC

## 2013-08-22 MED ORDER — AMOXICILLIN 500 MG PO CAPS: 500.0000 mg | ORAL_CAPSULE | Freq: Three times a day (TID) | ORAL | Status: DC

## 2013-08-22 MED ORDER — IBUPROFEN 800 MG PO TABS: ORAL_TABLET | ORAL | Status: DC

## 2013-08-22 NOTE — Telephone Encounter (Signed)
Pt is requesting a refill of her Ibuprofen 800mg  Last filled 04/2013 x 3 refills Upcoming ov 11/21/13  [facility] [address] [place]  Please advise Dr Annamaria Boots is okay to fill and if so, how many refills? Thanks.

## 2013-08-22 NOTE — Telephone Encounter (Signed)
Spoke with pt-- C/o increased cough with green mucus production, sob, wheezing, chest tx and chest pain/back pain. Denies f/c/s Pt requesting refill of cough syrup and rx for antibiotic  No Known Allergies     ADVAIR DISKUS 500-50 MCG/DOSE Aepb  Generic drug:  Fluticasone-Salmeterol  Inhale 1 puff into the lungs every 12 (twelve) hours.     ALPRAZolam 1 MG tablet  Commonly known as:  XANAX  1 every 6 hours, only if really needed     amphetamine-dextroamphetamine 10 MG tablet  Commonly known as:  ADDERALL  Take 1 tablet (10 mg total) by mouth daily.     antipyrine-benzocaine otic solution  Commonly known as:  AURALGAN  1-2 drops in affected ear with cotton ball twice daily     chlorpheniramine-HYDROcodone 10-8 MG/5ML Lqcr  Commonly known as:  TUSSIONEX PENNKINETIC ER  1/2-1 teaspoon every 12 hours if needed for cough     clonazePAM 0.5 MG tablet  Commonly known as:  KLONOPIN  1-2 tablets for sleep as needed     HYDROcodone-acetaminophen 10-325 MG per tablet  Commonly known as:  NORCO  Take 1 tablet by mouth every 6 (six) hours as needed.     ibuprofen 800 MG tablet  Commonly known as:  ADVIL,MOTRIN  TAKE 1 TABLET BY MOUTH 2 TIMES DAILY WITH FOOD AS NEEDED FOR PAIN     ipratropium-albuterol 0.5-2.5 (3) MG/3ML Soln  Commonly known as:  DUONEB  Take 3 mLs by nebulization every 6 (six) hours as needed. Dx: 496     methylPREDNISolone 8 MG tablet  Commonly known as:  MEDROL  4 X 2 DAYS, 3 X 2 DAYS, 2 X 2 DAYS, 1 X 2 DAYS     NEXIUM 40 MG capsule  Generic drug:  esomeprazole  TAKE 1 CAPSULE (40 MG TOTAL) BY MOUTH DAILY BEFORE BREAKFAST.     predniSONE 5 MG tablet  Commonly known as:  DELTASONE  Take 1 tablet (5 mg total) by mouth daily with breakfast.     PROAIR HFA 108 (90 BASE) MCG/ACT inhaler  Generic drug:  albuterol  INHALE 2 PUFFS INTO THE LUNGS EVERY 4 (FOUR) HOURS AS NEEDED.     promethazine-codeine 6.25-10 MG/5ML syrup  Commonly known as:  PHENERGAN with  CODEINE  TAKE 5 ML BY MOUTH EVERY 4-6 HOURS AS NEEDED FOR COUGH     tiotropium 18 MCG inhalation capsule  Commonly known as:  SPIRIVA  Place 18 mcg into inhaler and inhale daily.     traMADol 100 MG 24 hr tablet  Commonly known as:  ULTRAM-ER  Take 1 tablet (100 mg total) by mouth every 8 (eight) hours as needed for pain.       [facility] [address]  Please advise Dr Annamaria Boots. Thanks.

## 2013-08-22 NOTE — Telephone Encounter (Signed)
Per CY-Promethazine with codeine cough syrup #275ml take 1 tsp every 6 hour prn no refills and Amoxicillin 500 mg #21 take 1 po TID no refills.

## 2013-08-22 NOTE — Telephone Encounter (Signed)
Per CY-yes okay to refill. Thanks.  

## 2013-08-22 NOTE — Telephone Encounter (Signed)
Pt aware rx has been sent. Nothing further needed

## 2013-08-22 NOTE — Telephone Encounter (Signed)
Rx sent to pharmacy Amox 500 1 po tid and Phenergan-codeine syrup Pt aware.

## 2013-09-03 NOTE — Assessment & Plan Note (Signed)
Sustained exacerbation consistent with a post viral bronchitis. We discussed medication options. We discussed cough syrup because I don't want her to use it for anxiety. Plan-steroid Dosepak, using Medrol this time. Refill Tussionex. Chest x-ray

## 2013-09-23 ENCOUNTER — Telehealth: Payer: Self-pay | Admitting: Internal Medicine

## 2013-09-23 MED ORDER — AZITHROMYCIN 250 MG PO TABS: ORAL_TABLET | ORAL | Status: AC

## 2013-09-23 MED ORDER — PROMETHAZINE-CODEINE 6.25-10 MG/5ML PO SYRP: ORAL_SOLUTION | ORAL | Status: DC

## 2013-09-23 NOTE — Telephone Encounter (Signed)
Spoke with pt. She is wanting refills on Zpack, Promethazine-Codeine syrup and Adderall. Reports cough, SOB, chest tightness, ear pain, runny nose and sore throat. Mucus is green in color. Has been using breathing treatments and albuterol HFA with minimal relief.  In regards to Adderall this was last filled on 08/05/13. Last OV 08/08/13. Pending OV 11/21/13  MW - please advise on Zpack and cough syrup refills. Thanks.

## 2013-09-23 NOTE — Telephone Encounter (Signed)
PT CALLING BACK FROM [place] THIS MORNING WANTING TO KNOW STATUS OF PESCRIPTS.Felicia Odom

## 2013-09-23 NOTE — Telephone Encounter (Signed)
Per MW we can call in cough med. Both prescriptions have been called/sent in. Pt is aware. Will send message to CY to address Adderall. Pt is aware that it will be Wednesday before this is addressed.

## 2013-09-23 NOTE — Telephone Encounter (Signed)
Ok to refill adderall 

## 2013-09-23 NOTE — Telephone Encounter (Signed)
Ok for zpak and Please schedule a follow up office visit in 4 weeks, sooner if needed oz cough meds and let Dr Annamaria Boots decide re adderall tomorrow.

## 2013-09-23 NOTE — Telephone Encounter (Signed)
Pt call for zpak and  Adurol.Felicia Odom

## 2013-09-24 MED ORDER — AMPHETAMINE-DEXTROAMPHETAMINE 10 MG PO TABS: 10.0000 mg | ORAL_TABLET | Freq: Every day | ORAL | Status: DC

## 2013-09-24 NOTE — Telephone Encounter (Signed)
LMTCB-Rx has been placed on CY's cart to sign; need to know if patient wants the Rx mailed to her or if is she would like to pick up Rx. Thanks.

## 2013-09-25 NOTE — Telephone Encounter (Signed)
LMTCBx2 does rx need to be mailed or picked up?

## 2013-09-26 NOTE — Telephone Encounter (Signed)
lmtcb x3 

## 2013-09-27 NOTE — Telephone Encounter (Signed)
LMTCBx4 on home and cell #. Does she want rx mailed or picked up? [place] Bing, CMA

## 2013-09-30 ENCOUNTER — Telehealth: Payer: Self-pay | Admitting: Internal Medicine

## 2013-09-30 MED ORDER — PROMETHAZINE-CODEINE 6.25-10 MG/5ML PO SYRP: ORAL_SOLUTION | ORAL | Status: DC

## 2013-09-30 NOTE — Telephone Encounter (Signed)
Per CY-patient can have 100 ml of cough syrup; she MUST make this last for 1 month.

## 2013-09-30 NOTE — Telephone Encounter (Signed)
RX for adderral has been palced for pick up. She is also requesting refill on phenergan w/ codeine cough syrup. Last refilled 09/23/13 #200 ml. Pt reports she is out completely. Please advise CDY thanks  No Known Allergies

## 2013-09-30 NOTE — Telephone Encounter (Signed)
Called spoke with pt. Made aware of recs. Spoke [facility]/ [facility]lex from [facility]-gave VO. Nothing further needed

## 2013-09-30 NOTE — Telephone Encounter (Signed)
LMTCBx5 on cell #, atc home # NA, no voicemail. I checked with Joellen Jersey and she has the Rx at her desk,  [place] Bing, [place]

## 2013-10-02 NOTE — Telephone Encounter (Signed)
Rx has been placed at front for pick up per phone note from 09-30-13. [place] Bing, CMA

## 2013-10-15 ENCOUNTER — Ambulatory Visit: Payer: Medicare Other | Admitting: Internal Medicine

## 2013-10-18 ENCOUNTER — Encounter: Payer: Self-pay | Admitting: Internal Medicine

## 2013-10-18 ENCOUNTER — Ambulatory Visit (INDEPENDENT_AMBULATORY_CARE_PROVIDER_SITE_OTHER): Payer: Medicare Other | Admitting: Internal Medicine

## 2013-10-18 VITALS — BP 138/78 | HR 94 | Ht 66.0 in | Wt 189.0 lb

## 2013-10-18 DIAGNOSIS — J309 Allergic rhinitis, unspecified: Secondary | ICD-10-CM

## 2013-10-18 DIAGNOSIS — J449 Chronic obstructive pulmonary disease, unspecified: Secondary | ICD-10-CM

## 2013-10-18 DIAGNOSIS — K219 Gastro-esophageal reflux disease without esophagitis: Secondary | ICD-10-CM

## 2013-10-18 MED ORDER — AMOXICILLIN-POT CLAVULANATE 875-125 MG PO TABS: 1.0000 | ORAL_TABLET | Freq: Two times a day (BID) | ORAL | Status: DC

## 2013-10-18 MED ORDER — ALPRAZOLAM 1 MG PO TABS: ORAL_TABLET | ORAL | Status: DC

## 2013-10-18 MED ORDER — PROMETHAZINE-CODEINE 6.25-10 MG/5ML PO SYRP: ORAL_SOLUTION | ORAL | Status: DC
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
## 2013-10-18 NOTE — Patient Instructions (Addendum)
Script for cough syrup- Please make it last.   Script for augmentin for sinus infection printed  Script refill alprazolam printed

## 2013-11-01 ENCOUNTER — Encounter: Payer: Self-pay | Admitting: Internal Medicine

## 2013-11-04 ENCOUNTER — Telehealth: Payer: Self-pay | Admitting: Internal Medicine

## 2013-11-04 MED ORDER — AMPHETAMINE-DEXTROAMPHETAMINE 10 MG PO TABS: 10.0000 mg | ORAL_TABLET | Freq: Every day | ORAL | Status: DC

## 2013-11-04 NOTE — Telephone Encounter (Signed)
Last OV 10/18/13 Pending OV 12/24/13 Last fill 09/24/13  CY - please advise on refill. Thanks!

## 2013-11-04 NOTE — Telephone Encounter (Signed)
Rx is ready for pick up. Pt is aware  

## 2013-11-04 NOTE — Telephone Encounter (Signed)
Ok to refill 

## 2013-11-06 ENCOUNTER — Encounter: Payer: Self-pay | Admitting: Internal Medicine

## 2013-11-06 NOTE — Assessment & Plan Note (Signed)
Sustained bronchitis Plan-Augmentin, refilled cough syrup with discussion, sample of Spiriva Respimat

## 2013-11-06 NOTE — Assessment & Plan Note (Signed)
Watching for seasonal pollen exacerbation

## 2013-11-06 NOTE — Assessment & Plan Note (Signed)
Reminded reflux precautions 

## 2013-11-07 ENCOUNTER — Other Ambulatory Visit (HOSPITAL_COMMUNITY): Payer: Self-pay | Admitting: Obstetrics & Gynecology

## 2013-11-07 DIAGNOSIS — Z1231 Encounter for screening mammogram for malignant neoplasm of breast: Secondary | ICD-10-CM

## 2013-11-09 ENCOUNTER — Other Ambulatory Visit: Payer: Self-pay | Admitting: Internal Medicine

## 2013-11-12 ENCOUNTER — Telehealth: Payer: Self-pay | Admitting: Internal Medicine

## 2013-11-12 MED ORDER — PROMETHAZINE-CODEINE 6.25-10 MG/5ML PO SYRP: ORAL_SOLUTION | ORAL | Status: DC

## 2013-11-12 NOTE — Telephone Encounter (Signed)
Called spoke with pt. She is requesting refill on promethazine w/ codeine cough syrup. This was last refilled 10/18/13 #240 ML Pt reports she has a tickle in her throat from where her grass was mowed. Please advise Dr. Annamaria Boots thanks

## 2013-11-12 NOTE — Telephone Encounter (Signed)
RX called in and pt aware.  Nothing further needed 

## 2013-11-12 NOTE — Telephone Encounter (Signed)
Ok to refill 

## 2013-11-21 ENCOUNTER — Ambulatory Visit: Payer: Medicare Other | Admitting: Internal Medicine

## 2013-12-04 ENCOUNTER — Telehealth: Payer: Self-pay | Admitting: Internal Medicine

## 2013-12-04 MED ORDER — AMPHETAMINE-DEXTROAMPHETAMINE 10 MG PO TABS: 10.0000 mg | ORAL_TABLET | Freq: Every day | ORAL | Status: DC

## 2013-12-04 NOTE — Telephone Encounter (Signed)
RX printed for adderall 10 mg. Please advise once ready for pick up. thanks

## 2013-12-04 NOTE — Telephone Encounter (Signed)
Pt was made aware that Rx was ready for pick up at lunch time. Nothing more needed at this time.

## 2013-12-04 NOTE — Telephone Encounter (Signed)
adderol 10 or 15 mg she is not sure

## 2013-12-04 NOTE — Telephone Encounter (Signed)
lmomtcb x1--need to confirm dosage

## 2013-12-06 ENCOUNTER — Telehealth: Payer: Self-pay | Admitting: Internal Medicine

## 2013-12-06 NOTE — Telephone Encounter (Signed)
Report received and showed Dr. Annamaria Boots Pt will no longer be able to get cough syrup refilled through [place]. She will need to get this refilled through Dr. Jenita Seashore from now on. Pt can not have any early refills on alprazolam Called pt. She reports Dr. Jenita Seashore is her PCP and she has been getting refills through him. I advised her this will need to come from him and Dr. Annamaria Boots will no longer refill this for her. She voiced her understanding. She is also aware alprazolam is not able to be refilled until 12/08/13. Nothing further needed

## 2013-12-06 NOTE — Telephone Encounter (Signed)
Pt last received refill on alprazolam 1 mg 10/18/13 #120 x 3 refills Promethazine w/ codiene 11/03/13 #240 ML x 0 refills--MUST LAST 1 MONTH. Called [facility] spoke with Pharm. Pt still has 3 refills left on alprazolam but can't get this refilled until 12/08/13--too early for refill. Pharm has already advised pt of this. The pharmacists also stated pt is getting promethazine w/ codeine refilled regularly. Dr. Jenita Seashore has also been writing for pt promethazine w/ codeine. She l;ast picked up #240 ML 12/03/13 from Dr. Jenita Seashore.  On 11/25/12 she also picked up RX from Dr. Jenita Seashore for promethazine w/ codeine. She is faxing over a pharmacy report for Dr. Annamaria Boots to review how often pt is getting cough syrup and quantity. This will be faxed to triage. Once received will give to [facility] to review. Will send message to Dr. Annamaria Boots in meantime. Please advise thanks

## 2013-12-08 ENCOUNTER — Other Ambulatory Visit: Payer: Self-pay | Admitting: Internal Medicine

## 2013-12-09 ENCOUNTER — Ambulatory Visit (HOSPITAL_COMMUNITY): Admission: RE | Admit: 2013-12-09 | Payer: Medicare Other | Source: Ambulatory Visit

## 2013-12-09 ENCOUNTER — Encounter (INDEPENDENT_AMBULATORY_CARE_PROVIDER_SITE_OTHER): Payer: Self-pay

## 2013-12-20 ENCOUNTER — Ambulatory Visit: Payer: Medicare Other | Admitting: Internal Medicine

## 2013-12-24 ENCOUNTER — Ambulatory Visit: Payer: Medicare Other | Admitting: Internal Medicine

## 2014-01-06 ENCOUNTER — Telehealth: Payer: Self-pay | Admitting: Internal Medicine

## 2014-01-06 MED ORDER — AMPHETAMINE-DEXTROAMPHETAMINE 10 MG PO TABS: 10.0000 mg | ORAL_TABLET | Freq: Every day | ORAL | Status: DC

## 2014-01-06 NOTE — Telephone Encounter (Signed)
Pt is requesting to pick up rx for the adderall 10 mg 1 daily .  This was last written on 12-04-13 for #3.  CY please advise. Thanks  Last ov--10/18/13 No pending appt  No Known Allergies  Current Outpatient Prescriptions on File Prior to Visit  Medication Sig Dispense Refill  . ALPRAZolam (XANAX) 1 MG tablet 1 every 6 hours, only if really needed  120 tablet  3  . amoxicillin-clavulanate (AUGMENTIN) 875-125 MG per tablet Take 1 tablet by mouth 2 (two) times daily.  14 tablet  1  . amphetamine-dextroamphetamine (ADDERALL) 10 MG tablet Take 1 tablet (10 mg total) by mouth daily.  30 tablet  0  . antipyrine-benzocaine (AURALGAN) otic solution 1-2 drops in affected ear with cotton ball twice daily  10 mL  0  . clonazePAM (KLONOPIN) 0.5 MG tablet 1-2 tablets for sleep as needed  60 tablet  3  . Fluticasone-Salmeterol (ADVAIR DISKUS) 500-50 MCG/DOSE AEPB Inhale 1 puff into the lungs every 12 (twelve) hours.        Marland Kitchen HYDROcodone-acetaminophen (NORCO) 10-325 MG per tablet Take 1 tablet by mouth every 6 (six) hours as needed.      Marland Kitchen ibuprofen (ADVIL,MOTRIN) 800 MG tablet TAKE 1 TABLET BY MOUTH 2 TIMES DAILY WITH FOOD AS NEEDED FOR PAIN  60 tablet  3  . ipratropium-albuterol (DUONEB) 0.5-2.5 (3) MG/3ML SOLN Take 3 mLs by nebulization every 6 (six) hours as needed. Dx: 496  360 mL  prn  . NEXIUM 40 MG capsule TAKE 1 CAPSULE (40 MG TOTAL) BY MOUTH DAILY BEFORE BREAKFAST.  30 capsule  2  . NEXIUM 40 MG capsule TAKE 1 CAPSULE (40 MG TOTAL) BY MOUTH DAILY BEFORE BREAKFAST.  30 capsule  2  . PROAIR HFA 108 (90 BASE) MCG/ACT inhaler INHALE 2 PUFFS INTO THE LUNGS EVERY 4 (FOUR) HOURS AS NEEDED.  8.5 each  6  . promethazine-codeine (PHENERGAN WITH CODEINE) 6.25-10 MG/5ML syrup TAKE 5 ML BY MOUTH EVERY 6 HOURS AS NEEDED FOR COUGH  240 mL  0  . tiotropium (SPIRIVA) 18 MCG inhalation capsule Place 18 mcg into inhaler and inhale daily.        . traMADol (ULTRAM-ER) 100 MG 24 hr tablet Take 1 tablet (100 mg total) by  mouth every 8 (eight) hours as needed for pain.  30 tablet  0  . [DISCONTINUED] estradiol (VIVELLE-DOT) 0.0375 MG/24HR Place 1 patch onto the skin 2 (two) times a week.        . [DISCONTINUED] ipratropium (ATROVENT) 0.02 % nebulizer solution Take 2.5 mLs (500 mcg total) by nebulization 4 (four) times daily. DX:  496  300 mL  5  . [DISCONTINUED] progesterone (PROMETRIUM) 100 MG capsule Take 100 mg by mouth daily.         No current facility-administered medications on file prior to visit.

## 2014-01-06 NOTE — Telephone Encounter (Signed)
RX printed and placed on [facility] cart for sig. Please advise once ready for pick up thanks 

## 2014-01-06 NOTE — Telephone Encounter (Signed)
Ok refill # 30

## 2014-01-07 NOTE — Telephone Encounter (Signed)
Pt called to check on status of this Rx.  I checked with Katie, Dr. Annamaria Boots had just signed the prescription.  I called pt to let her know if is up front & ready for her to pick up.  I left a message on her voice mail.  Nothing further needed at this time.  Per Joellen Jersey, ok to close message.  Satira Anis

## 2014-01-08 ENCOUNTER — Ambulatory Visit (HOSPITAL_COMMUNITY)
Admission: RE | Admit: 2014-01-08 | Discharge: 2014-01-08 | Disposition: A | Discharge status: Home / Self Care | Payer: Medicare Other | Source: Ambulatory Visit | Attending: Obstetrics & Gynecology | Admitting: Obstetrics & Gynecology

## 2014-01-08 DIAGNOSIS — Z1231 Encounter for screening mammogram for malignant neoplasm of breast: Secondary | ICD-10-CM

## 2014-01-10 ENCOUNTER — Other Ambulatory Visit: Payer: Self-pay | Admitting: Obstetrics & Gynecology

## 2014-01-10 DIAGNOSIS — R928 Other abnormal and inconclusive findings on diagnostic imaging of breast: Secondary | ICD-10-CM

## 2014-01-14 ENCOUNTER — Ambulatory Visit
Admission: RE | Admit: 2014-01-14 | Discharge: 2014-01-14 | Disposition: A | Discharge status: Home / Self Care | Payer: Medicare Other | Source: Ambulatory Visit | Attending: Obstetrics & Gynecology | Admitting: Obstetrics & Gynecology

## 2014-01-14 DIAGNOSIS — R928 Other abnormal and inconclusive findings on diagnostic imaging of breast: Secondary | ICD-10-CM

## 2014-01-15 ENCOUNTER — Encounter: Payer: Self-pay | Admitting: Internal Medicine

## 2014-01-15 ENCOUNTER — Ambulatory Visit (INDEPENDENT_AMBULATORY_CARE_PROVIDER_SITE_OTHER): Payer: Medicare Other | Admitting: Internal Medicine

## 2014-01-15 ENCOUNTER — Other Ambulatory Visit: Payer: Medicare Other

## 2014-01-15 VITALS — BP 130/72 | HR 76 | Ht 66.0 in | Wt 189.4 lb

## 2014-01-15 DIAGNOSIS — F410 Panic disorder [episodic paroxysmal anxiety] without agoraphobia: Secondary | ICD-10-CM

## 2014-01-15 DIAGNOSIS — J449 Chronic obstructive pulmonary disease, unspecified: Secondary | ICD-10-CM

## 2014-01-15 DIAGNOSIS — K219 Gastro-esophageal reflux disease without esophagitis: Secondary | ICD-10-CM

## 2014-01-15 MED ORDER — FLUTICASONE-SALMETEROL 500-50 MCG/DOSE IN AEPB: 1.0000 | INHALATION_SPRAY | Freq: Two times a day (BID) | RESPIRATORY_TRACT | Status: DC

## 2014-01-15 MED ORDER — PROMETHAZINE-CODEINE 6.25-10 MG/5ML PO SYRP: ORAL_SOLUTION | ORAL | Status: DC
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
## 2014-01-15 NOTE — Patient Instructions (Signed)
Script for cough syrup- it is important to make this last. Use it for cough, not as a "nerve tonic"  Script to resume Advair 500 diskus     1 puff, then rinse mouth, twice daily   See if this calms the cough

## 2014-01-31 ENCOUNTER — Telehealth: Payer: Self-pay | Admitting: Internal Medicine

## 2014-01-31 MED ORDER — FLUTICASONE-SALMETEROL 250-50 MCG/DOSE IN AEPB: 1.0000 | INHALATION_SPRAY | Freq: Two times a day (BID) | RESPIRATORY_TRACT | Status: DC

## 2014-01-31 NOTE — Telephone Encounter (Signed)
Will forward to CY to advise if okay to refill requested medication.

## 2014-01-31 NOTE — Telephone Encounter (Signed)
Refill has been sent in to the mail order for the pt. Nothing further is needed.

## 2014-01-31 NOTE — Telephone Encounter (Signed)
Ok to change Advair to 250, # 3, ref x 3

## 2014-02-02 ENCOUNTER — Other Ambulatory Visit: Payer: Self-pay | Admitting: Internal Medicine

## 2014-02-03 ENCOUNTER — Telehealth: Payer: Self-pay | Admitting: Internal Medicine

## 2014-02-03 MED ORDER — AMPHETAMINE-DEXTROAMPHETAMINE 10 MG PO TABS: 10.0000 mg | ORAL_TABLET | Freq: Every day | ORAL | Status: DC

## 2014-02-03 NOTE — Telephone Encounter (Signed)
Pt is asking for a refill on her adderall 10 mg. Last refilled 01/06/14 Please advise Dr. Annamaria Boots thanks

## 2014-02-03 NOTE — Telephone Encounter (Signed)
Ok to refill x 1 year 

## 2014-02-03 NOTE — Telephone Encounter (Signed)
CY, Please advise if refill is appropriate. Thanks.

## 2014-02-03 NOTE — Telephone Encounter (Signed)
lmtcb x1 for pt Does she want the cough syrup as well?

## 2014-02-03 NOTE — Telephone Encounter (Signed)
Ok to refill adderall and the prometh codeine cough syrup

## 2014-02-03 NOTE — Telephone Encounter (Signed)
RX printed and placed on [facility] cart for signature. Please advise once done thanks

## 2014-02-03 NOTE — Telephone Encounter (Signed)
done

## 2014-02-03 NOTE — Telephone Encounter (Signed)
Pt states she does not want cough syrup as she has additional refills at this time. Pt wants Adderral Rx only and is on her way to pick this up.

## 2014-02-04 NOTE — Telephone Encounter (Signed)
Spoke with pt. States that she picked up Adderral Rx 02/03/14 Nothing further needed.

## 2014-02-13 ENCOUNTER — Telehealth: Payer: Self-pay | Admitting: Internal Medicine

## 2014-02-13 MED ORDER — ESOMEPRAZOLE MAGNESIUM 40 MG PO CPDR: DELAYED_RELEASE_CAPSULE | ORAL | Status: DC

## 2014-02-13 NOTE — Telephone Encounter (Signed)
Pt aware rx has been sent. Nothing further needed

## 2014-02-17 ENCOUNTER — Other Ambulatory Visit: Payer: Self-pay | Admitting: Internal Medicine

## 2014-02-17 NOTE — Telephone Encounter (Signed)
CY, okay to refill or go through PCP? Thanks

## 2014-02-17 NOTE — Telephone Encounter (Signed)
Ok to refill 

## 2014-03-05 ENCOUNTER — Encounter: Payer: Self-pay | Admitting: Internal Medicine

## 2014-03-05 ENCOUNTER — Ambulatory Visit: Payer: Medicare Other | Admitting: Internal Medicine

## 2014-03-05 ENCOUNTER — Encounter (INDEPENDENT_AMBULATORY_CARE_PROVIDER_SITE_OTHER): Payer: Self-pay

## 2014-03-05 MED ORDER — AMPHETAMINE-DEXTROAMPHETAMINE 10 MG PO TABS: 10.0000 mg | ORAL_TABLET | Freq: Every day | ORAL | Status: DC

## 2014-03-05 MED ORDER — ALPRAZOLAM 1 MG PO TABS: ORAL_TABLET | ORAL | Status: DC
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
## 2014-03-05 NOTE — Patient Instructions (Signed)
Refill scripts for adderall and Xanax  Paper signed related to your Disability status  Please call as needed

## 2014-03-29 ENCOUNTER — Other Ambulatory Visit: Payer: Self-pay | Admitting: Internal Medicine

## 2014-04-01 ENCOUNTER — Telehealth: Payer: Self-pay | Admitting: Internal Medicine

## 2014-04-01 MED ORDER — AMPHETAMINE-DEXTROAMPHETAMINE 10 MG PO TABS: 10.0000 mg | ORAL_TABLET | Freq: Every day | ORAL | Status: DC

## 2014-04-01 NOTE — Telephone Encounter (Signed)
Ok to refill both?? 

## 2014-04-01 NOTE — Telephone Encounter (Signed)
[facility]ed [facility] Pt received refill on alprazolam 03/05/14 #120. Pt has 3 refills. Pt refilled adderall 10 mg 03/05/14 #30 x 0 refills Please advise Dr. Annamaria Boots thanks

## 2014-04-01 NOTE — Telephone Encounter (Signed)
Pt aware that there are refills of Xanax at pharmacy Pt also aware that Adderall Rx to be placed up front to be picked up. Given Rx to CY to sign Nothing further needed.

## 2014-04-05 NOTE — Assessment & Plan Note (Signed)
Subacute exacerbation Plan-resume Advair 500 with discussion, codeine cough syrup

## 2014-04-05 NOTE — Assessment & Plan Note (Signed)
Reminded of control measures

## 2014-04-05 NOTE — Assessment & Plan Note (Signed)
Reviewed appropriate use of Xanax

## 2014-04-26 ENCOUNTER — Other Ambulatory Visit: Payer: Self-pay | Admitting: Internal Medicine

## 2014-04-30 ENCOUNTER — Telehealth: Payer: Self-pay | Admitting: Internal Medicine

## 2014-04-30 MED ORDER — AMPHETAMINE-DEXTROAMPHETAMINE 10 MG PO TABS: 10.0000 mg | ORAL_TABLET | Freq: Every day | ORAL | Status: DC

## 2014-04-30 NOTE — Telephone Encounter (Signed)
LMOM TCB x1 for pt to verify medication dose/strength.

## 2014-04-30 NOTE — Telephone Encounter (Signed)
Pt returned call. Pt requesting refill on Adderall 10mg .  Last filled on 04/01/14 for #30 with 0 refills. Pt last seen on 03/05/14 by CY.  Pt wanting rx picked up today before 3:30p  CY please advise if ok to fill.  No Known Allergies   Current Outpatient Prescriptions on File Prior to Visit  Medication Sig Dispense Refill  . ALPRAZolam (XANAX) 1 MG tablet 1 every 6 hours, only if really needed  120 tablet  3  . amphetamine-dextroamphetamine (ADDERALL) 10 MG tablet Take 1 tablet (10 mg total) by mouth daily.  30 tablet  0  . Fluticasone-Salmeterol (ADVAIR DISKUS) 250-50 MCG/DOSE AEPB Inhale 1 puff into the lungs 2 (two) times daily.  3 each  3  . HYDROcodone-acetaminophen (NORCO) 10-325 MG per tablet Take 1 tablet by mouth every 6 (six) hours as needed.      Marland Kitchen ibuprofen (ADVIL,MOTRIN) 800 MG tablet TAKE 1 TABLET BY MOUTH 2 TIMES DAILY WITH FOOD AS NEEDED FOR PAIN  60 tablet  3  . ipratropium-albuterol (DUONEB) 0.5-2.5 (3) MG/3ML SOLN TAKE 3 MLS IN NEBULIZER EVERY 6 HOURS AS NEEDED  360 mL  4  . NEXIUM 40 MG capsule TAKE 1 CAPSULE (40 MG TOTAL) BY MOUTH DAILY BEFORE BREAKFAST.  30 capsule  2  . PROAIR HFA 108 (90 BASE) MCG/ACT inhaler INHALE 2 PUFFS INTO THE LUNGS EVERY 4 (FOUR) HOURS AS NEEDED.  8.5 each  6  . Tiotropium Bromide Monohydrate (SPIRIVA RESPIMAT) 2.5 MCG/ACT AERS Inhale 2 puffs into the lungs daily.      . [DISCONTINUED] estradiol (VIVELLE-DOT) 0.0375 MG/24HR Place 1 patch onto the skin 2 (two) times a week.        . [DISCONTINUED] ipratropium (ATROVENT) 0.02 % nebulizer solution Take 2.5 mLs (500 mcg total) by nebulization 4 (four) times daily. DX:  496  300 mL  5  . [DISCONTINUED] progesterone (PROMETRIUM) 100 MG capsule Take 100 mg by mouth daily.         No current facility-administered medications on file prior to visit.

## 2014-04-30 NOTE — Telephone Encounter (Signed)
Ok to refill 

## 2014-04-30 NOTE — Telephone Encounter (Signed)
Rx signed and up front for pick up  I spoke with the pt and notified her of this  She verbalized understanding  Nothing further needed

## 2014-05-06 ENCOUNTER — Ambulatory Visit: Payer: Medicare Other | Admitting: Internal Medicine

## 2014-05-19 ENCOUNTER — Telehealth: Payer: Self-pay | Admitting: Internal Medicine

## 2014-05-19 MED ORDER — AMOXICILLIN 500 MG PO TABS: 500.0000 mg | ORAL_TABLET | Freq: Three times a day (TID) | ORAL | Status: DC

## 2014-05-19 NOTE — Telephone Encounter (Signed)
Per CY: Offer Amoxicillin 500mg  #21 - Take 1 tab TID  Spoke with pt--aware Rx [facility] [place]. Nothing further needed.

## 2014-05-19 NOTE — Telephone Encounter (Signed)
Spoke with pt, she c/o prod cough with yellow mucus, sinus congestion Xfew days.  States she was running a fever Saturday but did not see how high it was.  Pt is taking cough medicine regularly, is requesting another medication called in.   Last visit:8/12 Next visit: 12/8 [facility] [address]  Dr Annamaria Boots please advise.  Thank you.   No Known Allergies Current Outpatient Prescriptions on File Prior to Visit  Medication Sig Dispense Refill  . ALPRAZolam (XANAX) 1 MG tablet 1 every 6 hours, only if really needed  120 tablet  3  . amphetamine-dextroamphetamine (ADDERALL) 10 MG tablet Take 1 tablet (10 mg total) by mouth daily.  30 tablet  0  . Fluticasone-Salmeterol (ADVAIR DISKUS) 250-50 MCG/DOSE AEPB Inhale 1 puff into the lungs 2 (two) times daily.  3 each  3  . HYDROcodone-acetaminophen (NORCO) 10-325 MG per tablet Take 1 tablet by mouth every 6 (six) hours as needed.      Marland Kitchen ibuprofen (ADVIL,MOTRIN) 800 MG tablet TAKE 1 TABLET BY MOUTH 2 TIMES DAILY WITH FOOD AS NEEDED FOR PAIN  60 tablet  3  . ipratropium-albuterol (DUONEB) 0.5-2.5 (3) MG/3ML SOLN TAKE 3 MLS IN NEBULIZER EVERY 6 HOURS AS NEEDED  360 mL  4  . NEXIUM 40 MG capsule TAKE 1 CAPSULE (40 MG TOTAL) BY MOUTH DAILY BEFORE BREAKFAST.  30 capsule  2  . PROAIR HFA 108 (90 BASE) MCG/ACT inhaler INHALE 2 PUFFS INTO THE LUNGS EVERY 4 (FOUR) HOURS AS NEEDED.  8.5 each  6  . Tiotropium Bromide Monohydrate (SPIRIVA RESPIMAT) 2.5 MCG/ACT AERS Inhale 2 puffs into the lungs daily.      . [DISCONTINUED] estradiol (VIVELLE-DOT) 0.0375 MG/24HR Place 1 patch onto the skin 2 (two) times a week.        . [DISCONTINUED] ipratropium (ATROVENT) 0.02 % nebulizer solution Take 2.5 mLs (500 mcg total) by nebulization 4 (four) times daily. DX:  496  300 mL  5  . [DISCONTINUED] progesterone (PROMETRIUM) 100 MG capsule Take 100 mg by mouth daily.         No current facility-administered medications on file prior to visit.

## 2014-05-20 ENCOUNTER — Telehealth: Payer: Self-pay | Admitting: Internal Medicine

## 2014-05-20 NOTE — Telephone Encounter (Signed)
Spoke with the pt  She states that she saw an add on TV today for [facility]  She states she may want [place] to send them order for her duoneb, but she is unsure at this time  She needs to check with her insurance  She states that the rep at [facility] told her to get a contact name for someone here  I advised they can call and speak with anyone if needed  She verbalized understanding and will call if ins approves and needs rx  Nothing further needed

## 2014-05-22 ENCOUNTER — Other Ambulatory Visit: Payer: Self-pay | Admitting: Internal Medicine

## 2014-05-22 MED ORDER — IPRATROPIUM-ALBUTEROL 0.5-2.5 (3) MG/3ML IN SOLN: RESPIRATORY_TRACT | Status: DC

## 2014-05-22 NOTE — Telephone Encounter (Signed)
Refill approved by [facility] request faxed to [place] @[phone].

## 2014-05-28 ENCOUNTER — Other Ambulatory Visit: Payer: Self-pay | Admitting: Internal Medicine

## 2014-05-30 NOTE — Telephone Encounter (Signed)
CY Please advise on refill. Thanks.  

## 2014-05-31 NOTE — Telephone Encounter (Signed)
Ok to refill 

## 2014-06-02 ENCOUNTER — Telehealth: Payer: Self-pay | Admitting: Internal Medicine

## 2014-06-02 MED ORDER — AMPHETAMINE-DEXTROAMPHETAMINE 10 MG PO TABS: 10.0000 mg | ORAL_TABLET | Freq: Every day | ORAL | Status: DC

## 2014-06-02 NOTE — Telephone Encounter (Signed)
Rx signed and up front for pick up  LMTCB on her mobile  NA at home number

## 2014-06-02 NOTE — Telephone Encounter (Signed)
Rx printed and placed on CY cart to sign. Pt needs to be contacted once signed and placed up front.

## 2014-06-02 NOTE — Telephone Encounter (Signed)
Spoke with pt, she is requesting a refill on adderall.  Last filled 04/30/14 with 0 refills.   Last ov: 03/05/14     Next ov:  07/01/14  CY are you ok with this being filled?  Thanks!  No Known Allergies Current Outpatient Prescriptions on File Prior to Visit  Medication Sig Dispense Refill  . ALPRAZolam (XANAX) 1 MG tablet 1 every 6 hours, only if really needed 120 tablet 3  . amoxicillin (AMOXIL) 500 MG tablet Take 1 tablet (500 mg total) by mouth 3 (three) times daily. 21 tablet 0  . amphetamine-dextroamphetamine (ADDERALL) 10 MG tablet Take 1 tablet (10 mg total) by mouth daily. 30 tablet 0  . Fluticasone-Salmeterol (ADVAIR DISKUS) 250-50 MCG/DOSE AEPB Inhale 1 puff into the lungs 2 (two) times daily. 3 each 3  . HYDROcodone-acetaminophen (NORCO) 10-325 MG per tablet Take 1 tablet by mouth every 6 (six) hours as needed.    Marland Kitchen ibuprofen (ADVIL,MOTRIN) 800 MG tablet TAKE 1 TABLET BY MOUTH 2 TIMES DAILY WITH FOOD AS NEEDED FOR PAIN 60 tablet 3  . ipratropium-albuterol (DUONEB) 0.5-2.5 (3) MG/3ML SOLN TAKE 3 MLS IN NEBULIZER EVERY 6 HOURS AS NEEDED 360 mL 4  . NEXIUM 40 MG capsule TAKE 1 CAPSULE (40 MG TOTAL) BY MOUTH DAILY BEFORE BREAKFAST. 30 capsule 2  . PROAIR HFA 108 (90 BASE) MCG/ACT inhaler INHALE 2 PUFFS INTO THE LUNGS EVERY 4 (FOUR) HOURS AS NEEDED. 8.5 each 6  . Tiotropium Bromide Monohydrate (SPIRIVA RESPIMAT) 2.5 MCG/ACT AERS Inhale 2 puffs into the lungs daily.    . [DISCONTINUED] estradiol (VIVELLE-DOT) 0.0375 MG/24HR Place 1 patch onto the skin 2 (two) times a week.      . [DISCONTINUED] ipratropium (ATROVENT) 0.02 % nebulizer solution Take 2.5 mLs (500 mcg total) by nebulization 4 (four) times daily. DX:  496 300 mL 5  . [DISCONTINUED] progesterone (PROMETRIUM) 100 MG capsule Take 100 mg by mouth daily.       No current facility-administered medications on file prior to visit.

## 2014-06-02 NOTE — Telephone Encounter (Signed)
Done

## 2014-06-02 NOTE — Telephone Encounter (Signed)
Ok to refill 

## 2014-06-02 NOTE — Telephone Encounter (Signed)
Pt aware.

## 2014-06-05 NOTE — Telephone Encounter (Signed)
Called refill to [facility] [address] [facility]-gave verbal Rx to Ramona.

## 2014-06-20 ENCOUNTER — Telehealth: Payer: Self-pay | Admitting: Internal Medicine

## 2014-06-23 NOTE — Telephone Encounter (Signed)
Pt calling to check on status of refill that was to be called in from [facility] [address] pt can be reached @ [phone].Hillery Hunter

## 2014-06-23 NOTE — Telephone Encounter (Signed)
Last refill sent 02-18-14 #60 x 3 refills. Pt is asking for a refill on ibuprofen 800mg . Refill sent. Samnorwood Bing, CMA

## 2014-07-01 ENCOUNTER — Encounter: Payer: Self-pay | Admitting: Internal Medicine

## 2014-07-01 ENCOUNTER — Ambulatory Visit: Payer: Medicare Other | Admitting: Internal Medicine

## 2014-07-01 ENCOUNTER — Encounter (INDEPENDENT_AMBULATORY_CARE_PROVIDER_SITE_OTHER): Payer: Self-pay

## 2014-07-01 VITALS — BP 142/86 | HR 86 | Ht 66.0 in | Wt 200.0 lb

## 2014-07-01 DIAGNOSIS — IMO0001 Reserved for inherently not codable concepts without codable children: Secondary | ICD-10-CM

## 2014-07-01 MED ORDER — ALBUTEROL SULFATE (2.5 MG/3ML) 0.083% IN NEBU: 2.5000 mg | INHALATION_SOLUTION | Freq: Four times a day (QID) | RESPIRATORY_TRACT | Status: DC | PRN

## 2014-07-01 MED ORDER — AMPHETAMINE-DEXTROAMPHETAMINE 10 MG PO TABS: 10.0000 mg | ORAL_TABLET | Freq: Every day | ORAL | Status: DC

## 2014-07-01 MED ORDER — COMPRESSOR/NEBULIZER MISC: Status: DC

## 2014-07-01 MED ORDER — ALPRAZOLAM 1 MG PO TABS: ORAL_TABLET | ORAL | Status: DC

## 2014-07-01 MED ORDER — FLUCONAZOLE 150 MG PO TABS: 150.0000 mg | ORAL_TABLET | Freq: Every day | ORAL | Status: DC
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
## 2014-07-01 NOTE — Patient Instructions (Signed)
Order - DME nebulizer machine and albuterol  For dx COPD with bronchitis  Script for Diflucan for yeast  Script for adderal and for xanax  Finish the antibiotic from UC for the pneumonia

## 2014-07-02 ENCOUNTER — Other Ambulatory Visit: Payer: Self-pay | Admitting: Internal Medicine

## 2014-07-03 MED ORDER — ALBUTEROL SULFATE (2.5 MG/3ML) 0.083% IN NEBU: 2.5000 mg | INHALATION_SOLUTION | Freq: Four times a day (QID) | RESPIRATORY_TRACT | Status: DC

## 2014-07-03 NOTE — Telephone Encounter (Signed)
Please advise on refill. Thanks. 

## 2014-07-03 NOTE — Telephone Encounter (Signed)
Ok to refill x 3 

## 2014-07-15 ENCOUNTER — Other Ambulatory Visit: Payer: Self-pay | Admitting: Internal Medicine

## 2014-07-15 MED ORDER — ESOMEPRAZOLE MAGNESIUM 40 MG PO CPDR: 40.0000 mg | DELAYED_RELEASE_CAPSULE | Freq: Every day | ORAL | Status: DC

## 2014-07-31 ENCOUNTER — Telehealth: Payer: Self-pay | Admitting: Internal Medicine

## 2014-07-31 MED ORDER — AMPHETAMINE-DEXTROAMPHETAMINE 10 MG PO TABS: 10.0000 mg | ORAL_TABLET | Freq: Every day | ORAL | Status: DC

## 2014-07-31 NOTE — Telephone Encounter (Signed)
Called and left detailed message for pt to make aware that Rx ready to be picked up. Rx placed up front in folder. Nothing further needed.

## 2014-07-31 NOTE — Telephone Encounter (Signed)
Rx printed and placed on CY's cart to sign. Will call patient once ready to pick up.

## 2014-08-26 ENCOUNTER — Other Ambulatory Visit: Payer: Self-pay | Admitting: Internal Medicine

## 2014-08-28 ENCOUNTER — Telehealth: Payer: Self-pay | Admitting: Internal Medicine

## 2014-08-28 MED ORDER — AMPHETAMINE-DEXTROAMPHETAMINE 10 MG PO TABS: 10.0000 mg | ORAL_TABLET | Freq: Every day | ORAL | Status: DC

## 2014-08-28 NOTE — Telephone Encounter (Signed)
Ok to refill 

## 2014-08-28 NOTE — Telephone Encounter (Signed)
Okay to refill.  Thanks.

## 2014-08-28 NOTE — Telephone Encounter (Signed)
CY Please advise on refill. Pt last filled in 04-2014. Thanks.

## 2014-08-28 NOTE — Telephone Encounter (Signed)
Pt is requesting refill on adderral. Last refilled 07/31/14 QD #30 x 0 refills Pt does not need aprazolam refilled. Please advise Dr. Annamaria Boots thanks

## 2014-08-28 NOTE — Telephone Encounter (Signed)
Rx signed and up front for pick up  Pt aware  Nothing further needed

## 2014-09-01 ENCOUNTER — Ambulatory Visit: Payer: Medicare Other | Admitting: Internal Medicine

## 2014-09-01 ENCOUNTER — Telehealth: Payer: Self-pay | Admitting: Internal Medicine

## 2014-09-01 NOTE — Telephone Encounter (Signed)
Katie have you seen this refill request for this pt?  thanks

## 2014-09-03 NOTE — Telephone Encounter (Signed)
This form was signed by [facility] and faxed back yesterday. Thanks.

## 2014-09-03 NOTE — Telephone Encounter (Signed)
Nothing further is needed. 

## 2014-09-24 ENCOUNTER — Telehealth: Payer: Self-pay | Admitting: Internal Medicine

## 2014-09-24 MED ORDER — AMPHETAMINE-DEXTROAMPHETAMINE 10 MG PO TABS: 10.0000 mg | ORAL_TABLET | Freq: Every day | ORAL | Status: DC

## 2014-09-24 NOTE — Telephone Encounter (Signed)
Last OV 07/01/14 Pending OV 09/29/14 Last refill 08/28/14 #30  CY - please advise on refill. Thanks.

## 2014-09-24 NOTE — Telephone Encounter (Signed)
Ok to refill 

## 2014-09-24 NOTE — Telephone Encounter (Signed)
Rx has been printed, signed and placed up front for pick up. Pt is aware.

## 2014-09-29 ENCOUNTER — Ambulatory Visit (INDEPENDENT_AMBULATORY_CARE_PROVIDER_SITE_OTHER)
Admission: RE | Admit: 2014-09-29 | Discharge: 2014-09-29 | Disposition: A | Discharge status: Home / Self Care | Payer: Medicare Other | Source: Ambulatory Visit | Attending: Internal Medicine | Admitting: Internal Medicine

## 2014-09-29 ENCOUNTER — Ambulatory Visit (INDEPENDENT_AMBULATORY_CARE_PROVIDER_SITE_OTHER): Payer: Medicare Other | Admitting: Internal Medicine

## 2014-09-29 ENCOUNTER — Encounter: Payer: Self-pay | Admitting: Internal Medicine

## 2014-09-29 VITALS — BP 130/76 | HR 97 | Ht 66.0 in | Wt 203.6 lb

## 2014-09-29 DIAGNOSIS — J449 Chronic obstructive pulmonary disease, unspecified: Secondary | ICD-10-CM

## 2014-09-29 DIAGNOSIS — J42 Unspecified chronic bronchitis: Secondary | ICD-10-CM

## 2014-09-29 DIAGNOSIS — R911 Solitary pulmonary nodule: Secondary | ICD-10-CM

## 2014-09-29 DIAGNOSIS — F410 Panic disorder [episodic paroxysmal anxiety] without agoraphobia: Secondary | ICD-10-CM | POA: Diagnosis not present

## 2014-09-29 MED ORDER — ALPRAZOLAM 1 MG PO TABS: ORAL_TABLET | ORAL | Status: DC

## 2014-09-29 MED ORDER — IBUPROFEN 800 MG PO TABS: ORAL_TABLET | ORAL | Status: DC

## 2014-09-29 MED ORDER — AMOXICILLIN 500 MG PO CAPS: 500.0000 mg | ORAL_CAPSULE | Freq: Three times a day (TID) | ORAL | Status: DC

## 2014-09-29 MED ORDER — PROMETHAZINE-CODEINE 6.25-10 MG/5ML PO SYRP: ORAL_SOLUTION | ORAL | Status: DC

## 2014-09-29 NOTE — Patient Instructions (Signed)
Scripts printed for cough syrup, amoxacillin, xanax, ibuprofen   Order CXR- dx R lung nodule, chronic bronchitis
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
## 2014-09-30 ENCOUNTER — Other Ambulatory Visit: Payer: Self-pay | Admitting: Internal Medicine

## 2014-09-30 ENCOUNTER — Telehealth: Payer: Self-pay | Admitting: Internal Medicine

## 2014-09-30 NOTE — Telephone Encounter (Signed)
Called and spoke with pt and she is aware of cxr results per CY.  Pt voiced her understanding and nothing further is needed.

## 2014-10-05 NOTE — Assessment & Plan Note (Addendum)
She thinks she is catching another cold. This may just be a viral bronchitis but there was question of pneumonia on previous chest x-ray. Plan-amoxicillin, chest x-ray, cough syrup

## 2014-10-05 NOTE — Assessment & Plan Note (Signed)
Medication talk. She hasn't had the finances for sustained psychiatric care and she blames some of her problems on menopausal symptoms. Plan-refill Xanax

## 2014-10-28 ENCOUNTER — Telehealth: Payer: Self-pay | Admitting: Internal Medicine

## 2014-10-28 MED ORDER — AMPHETAMINE-DEXTROAMPHETAMINE 10 MG PO TABS: 10.0000 mg | ORAL_TABLET | Freq: Every day | ORAL | Status: DC

## 2014-10-28 NOTE — Telephone Encounter (Signed)
Last ov 09/29/14 Last adderall rx #30 on 09/24/14 Rx printed and singed by [facility] and placed up front for pick up by me  I have called and made the pt aware Nothing further needed

## 2014-10-30 ENCOUNTER — Other Ambulatory Visit: Payer: Self-pay | Admitting: Internal Medicine

## 2014-10-30 NOTE — Telephone Encounter (Signed)
Please call her and clarify for what problems is she asking refill of levaquin? What symptoms of infection?

## 2014-10-30 NOTE — Telephone Encounter (Signed)
Dr. Annamaria Boots, please advise on refill request.

## 2014-11-26 ENCOUNTER — Telehealth: Payer: Self-pay | Admitting: Internal Medicine

## 2014-11-26 MED ORDER — AMPHETAMINE-DEXTROAMPHETAMINE 10 MG PO TABS: 10.0000 mg | ORAL_TABLET | Freq: Every day | ORAL | Status: DC

## 2014-11-26 NOTE — Telephone Encounter (Signed)
Left message for patient-letting her know her Rx is at front ready for pick up.

## 2014-11-26 NOTE — Telephone Encounter (Signed)
Pt is wanting refill on adderall. Last refilled 10/28/14 #30 QD. Last OV 09/29/14 Pending 01/29/15 Please advise Dr. Annamaria Boots thanks

## 2014-11-27 ENCOUNTER — Other Ambulatory Visit (HOSPITAL_COMMUNITY): Payer: Self-pay | Admitting: Obstetrics & Gynecology

## 2014-11-27 DIAGNOSIS — Z1231 Encounter for screening mammogram for malignant neoplasm of breast: Secondary | ICD-10-CM

## 2014-12-11 LAB — HM PAP SMEAR: HM Pap smear: NORMAL

## 2014-12-24 ENCOUNTER — Telehealth: Payer: Self-pay | Admitting: Internal Medicine

## 2014-12-24 MED ORDER — AMPHETAMINE-DEXTROAMPHETAMINE 10 MG PO TABS: 10.0000 mg | ORAL_TABLET | Freq: Every day | ORAL | Status: DC

## 2014-12-24 NOTE — Telephone Encounter (Signed)
RX printed and left for CY to sign.  Lmom for pt to pick up rx at front desk.

## 2014-12-24 NOTE — Telephone Encounter (Signed)
Ok to refill 

## 2014-12-24 NOTE — Telephone Encounter (Signed)
Last OV 09/29/14 Pending OV 01/29/15 Last refill 11/26/14  CY - please advise on refill. Thanks.

## 2015-01-14 ENCOUNTER — Telehealth: Payer: Self-pay | Admitting: Physician Assistant

## 2015-01-14 NOTE — Telephone Encounter (Signed)
Please see the encounter below

## 2015-01-14 NOTE — Telephone Encounter (Signed)
Pt called in and said that her son and husband come to you and she would like to see if you would take her on as pt as well.

## 2015-01-17 NOTE — Telephone Encounter (Signed)
yes

## 2015-01-19 NOTE — Telephone Encounter (Signed)
Got scheduled  °

## 2015-01-20 ENCOUNTER — Ambulatory Visit (HOSPITAL_COMMUNITY)
Admission: RE | Admit: 2015-01-20 | Discharge: 2015-01-20 | Disposition: A | Discharge status: Home / Self Care | Payer: Medicare Other | Source: Ambulatory Visit | Attending: Obstetrics & Gynecology | Admitting: Obstetrics & Gynecology

## 2015-01-20 DIAGNOSIS — Z1231 Encounter for screening mammogram for malignant neoplasm of breast: Secondary | ICD-10-CM | POA: Diagnosis present

## 2015-01-20 LAB — HM MAMMOGRAPHY: HM Mammogram: NORMAL

## 2015-01-21 ENCOUNTER — Other Ambulatory Visit (INDEPENDENT_AMBULATORY_CARE_PROVIDER_SITE_OTHER): Payer: Medicare Other

## 2015-01-21 ENCOUNTER — Ambulatory Visit (INDEPENDENT_AMBULATORY_CARE_PROVIDER_SITE_OTHER)
Admission: RE | Admit: 2015-01-21 | Discharge: 2015-01-21 | Disposition: A | Discharge status: Home / Self Care | Payer: Medicare Other | Source: Ambulatory Visit | Attending: Internal Medicine | Admitting: Internal Medicine

## 2015-01-21 ENCOUNTER — Ambulatory Visit (INDEPENDENT_AMBULATORY_CARE_PROVIDER_SITE_OTHER): Payer: Medicare Other | Admitting: Internal Medicine

## 2015-01-21 ENCOUNTER — Encounter: Payer: Self-pay | Admitting: Internal Medicine

## 2015-01-21 VITALS — BP 124/70 | HR 81 | Temp 98.6°F | Resp 16 | Ht 66.0 in | Wt 201.0 lb

## 2015-01-21 DIAGNOSIS — R739 Hyperglycemia, unspecified: Secondary | ICD-10-CM | POA: Diagnosis not present

## 2015-01-21 DIAGNOSIS — E785 Hyperlipidemia, unspecified: Secondary | ICD-10-CM | POA: Diagnosis not present

## 2015-01-21 DIAGNOSIS — M150 Primary generalized (osteo)arthritis: Secondary | ICD-10-CM

## 2015-01-21 DIAGNOSIS — R7989 Other specified abnormal findings of blood chemistry: Secondary | ICD-10-CM | POA: Diagnosis not present

## 2015-01-21 DIAGNOSIS — M159 Polyosteoarthritis, unspecified: Secondary | ICD-10-CM

## 2015-01-21 DIAGNOSIS — R7303 Prediabetes: Secondary | ICD-10-CM | POA: Insufficient documentation

## 2015-01-21 DIAGNOSIS — I700 Atherosclerosis of aorta: Secondary | ICD-10-CM

## 2015-01-21 DIAGNOSIS — Z86010 Personal history of colonic polyps: Secondary | ICD-10-CM | POA: Insufficient documentation

## 2015-01-21 DIAGNOSIS — S99922A Unspecified injury of left foot, initial encounter: Secondary | ICD-10-CM

## 2015-01-21 DIAGNOSIS — S99929A Unspecified injury of unspecified foot, initial encounter: Secondary | ICD-10-CM | POA: Insufficient documentation

## 2015-01-21 DIAGNOSIS — F410 Panic disorder [episodic paroxysmal anxiety] without agoraphobia: Secondary | ICD-10-CM

## 2015-01-21 HISTORY — DX: Hyperlipidemia, unspecified: E78.5

## 2015-01-21 HISTORY — DX: Primary generalized (osteo)arthritis: M15.0

## 2015-01-21 HISTORY — DX: Polyosteoarthritis, unspecified: M15.9

## 2015-01-21 LAB — CBC WITH DIFFERENTIAL/PLATELET
Basophils Absolute: 0.1 10*3/uL (ref 0.0–0.1)
Basophils Relative: 0.7 % (ref 0.0–3.0)
EOS PCT: 3.2 % (ref 0.0–5.0)
Eosinophils Absolute: 0.3 10*3/uL (ref 0.0–0.7)
HEMATOCRIT: 45.4 % (ref 36.0–46.0)
Hemoglobin: 15.3 g/dL — ABNORMAL HIGH (ref 12.0–15.0)
LYMPHS ABS: 2.5 10*3/uL (ref 0.7–4.0)
Lymphocytes Relative: 27.5 % (ref 12.0–46.0)
MCHC: 33.8 g/dL (ref 30.0–36.0)
MCV: 85.4 fl (ref 78.0–100.0)
MONO ABS: 0.8 10*3/uL (ref 0.1–1.0)
Monocytes Relative: 8.6 % (ref 3.0–12.0)
NEUTROS PCT: 60 % (ref 43.0–77.0)
Neutro Abs: 5.4 10*3/uL (ref 1.4–7.7)
Platelets: 312 10*3/uL (ref 150.0–400.0)
RBC: 5.32 Mil/uL — AB (ref 3.87–5.11)
RDW: 13.9 % (ref 11.5–15.5)
WBC: 9.1 10*3/uL (ref 4.0–10.5)

## 2015-01-21 LAB — COMPREHENSIVE METABOLIC PANEL
ALK PHOS: 73 U/L (ref 39–117)
ALT: 18 U/L (ref 0–35)
AST: 17 U/L (ref 0–37)
Albumin: 4.1 g/dL (ref 3.5–5.2)
BUN: 16 mg/dL (ref 6–23)
CO2: 29 mEq/L (ref 19–32)
Calcium: 9.9 mg/dL (ref 8.4–10.5)
Chloride: 101 mEq/L (ref 96–112)
Creatinine, Ser: 0.84 mg/dL (ref 0.40–1.20)
GFR: 73.95 mL/min (ref >60.00)
Glucose, Bld: 94 mg/dL (ref 70–99)
Potassium: 4 mEq/L (ref 3.5–5.1)
Sodium: 138 mEq/L (ref 135–145)
TOTAL PROTEIN: 7.6 g/dL (ref 6.0–8.3)
Total Bilirubin: 0.5 mg/dL (ref 0.2–1.2)

## 2015-01-21 LAB — URINALYSIS, ROUTINE W REFLEX MICROSCOPIC
BILIRUBIN URINE: NEGATIVE
HGB URINE DIPSTICK: NEGATIVE
Ketones, ur: NEGATIVE
LEUKOCYTES UA: NEGATIVE
NITRITE: NEGATIVE
PH: 6 (ref 5.0–8.0)
Specific Gravity, Urine: <=1.005 — AB (ref 1.000–1.030)
Total Protein, Urine: NEGATIVE
UROBILINOGEN UA: 0.2 (ref 0.0–1.0)
Urine Glucose: NEGATIVE

## 2015-01-21 LAB — TSH: TSH: 1.73 u[IU]/mL (ref 0.35–4.50)

## 2015-01-21 LAB — LIPID PANEL
Cholesterol: 225 mg/dL — ABNORMAL HIGH (ref 0–200)
HDL: 46.9 mg/dL (ref >39.00)
NonHDL: 178.1
TRIGLYCERIDES: 236 mg/dL — AB (ref 0.0–149.0)
Total CHOL/HDL Ratio: 5
VLDL: 47.2 mg/dL — ABNORMAL HIGH (ref 0.0–40.0)

## 2015-01-21 LAB — LDL CHOLESTEROL, DIRECT: Direct LDL: 142 mg/dL

## 2015-01-21 LAB — HEMOGLOBIN A1C: Hgb A1c MFr Bld: 5.7 % (ref 4.6–6.5)

## 2015-01-21 MED ORDER — HYDROCODONE-ACETAMINOPHEN 10-325 MG PO TABS: 1.0000 | ORAL_TABLET | Freq: Four times a day (QID) | ORAL | Status: DC | PRN

## 2015-01-21 MED ORDER — HYDROCODONE-ACETAMINOPHEN 10-325 MG PO TABS: 1.0000 | ORAL_TABLET | Freq: Two times a day (BID) | ORAL | Status: DC | PRN

## 2015-01-21 NOTE — Progress Notes (Signed)
Pre visit review using our clinic review tool, if applicable. No additional management support is needed unless otherwise documented below in the visit note. 

## 2015-01-21 NOTE — Progress Notes (Signed)
Subjective:  Patient ID: Felicia Odom, female    DOB: 1956/04/03  Age: 59 y.o. MRN: [id number]  CC: Osteoarthritis   HPI Felicia Odom presents for the complaint that she injured her left foot 2 days ago, she stubbed it on a dresser and now has pain and swelling in the 5 th toe. She also requests a refill on norco - she tells me that she has DJD in her knees and chronic neck pain due to DDD and that she has taken 4/day for 12 years and "I can't get thru the day without those norco's for pain relief."  History Felicia Odom has a past medical history of OCD (obsessive compulsive disorder); Panic disorder; COPD (chronic obstructive pulmonary disease); and Asthma.   She has past surgical history that includes Ectopic pregnancy surgery and Laparoscopic lysis intestinal adhesions.   Her family history includes Breast cancer in her sister; Clotting disorder in her mother; Diabetes in her mother; Heart attack in her father.She reports that she has quit smoking. Her smoking use included Cigarettes. She has a 20 pack-year smoking history. She has never used smokeless tobacco. She reports that she does not drink alcohol or use illicit drugs.  Outpatient Prescriptions Prior to Visit  Medication Sig Dispense Refill  . albuterol (PROVENTIL) (2.5 MG/3ML) 0.083% nebulizer solution Take 3 mLs (2.5 mg total) by nebulization every 6 (six) hours. Dx COPD with bronchitis 75 mL 12  . ALPRAZolam (XANAX) 1 MG tablet 1 every 6 hours, only if really needed 120 tablet 3  . esomeprazole (NEXIUM) 40 MG capsule Take 1 capsule (40 mg total) by mouth daily. 30 capsule prn  . ibuprofen (ADVIL,MOTRIN) 800 MG tablet TAKE 1 TABLET BY MOUTH 2 TIMES DAILY WITH FOOD AS NEEDED FOR PAIN 60 tablet 3  . Nebulizers (COMPRESSOR/NEBULIZER) MISC As directed 1 each 0  . PROAIR HFA 108 (90 BASE) MCG/ACT inhaler INHALE 2 PUFFS BY MOUTH INTO THE LUNGS EVERY 4 (FOUR) HOURS AS NEEDED. 8.5 Inhaler 3  . HYDROcodone-acetaminophen  (NORCO) 10-325 MG per tablet Take 1 tablet by mouth every 6 (six) hours as needed.    Marland Kitchen amoxicillin (AMOXIL) 500 MG capsule Take 1 capsule (500 mg total) by mouth 3 (three) times daily. 21 capsule 1  . amphetamine-dextroamphetamine (ADDERALL) 10 MG tablet Take 1 tablet (10 mg total) by mouth daily. 30 tablet 0  . fluconazole (DIFLUCAN) 150 MG tablet TAKE 1 TABLET BY MOUTH EVERY DAY 5 tablet 3  . Fluticasone-Salmeterol (ADVAIR DISKUS) 250-50 MCG/DOSE AEPB Inhale 1 puff into the lungs 2 (two) times daily. 3 each 3  . promethazine-codeine (PHENERGAN WITH CODEINE) 6.25-10 MG/5ML syrup TAKE 5 ML(S) EVERY 6 HOURS AS NEEDED FOR COUGH (MUST LAST 1 MONTH) 240 mL 0   No facility-administered medications prior to visit.    ROS Review of Systems  Constitutional: Negative.  Negative for fever, diaphoresis, appetite change and fatigue.  HENT: Negative.   Eyes: Negative.   Respiratory: Negative.  Negative for cough, choking, chest tightness and shortness of breath.   Cardiovascular: Negative.  Negative for chest pain and leg swelling.  Gastrointestinal: Negative.  Negative for nausea, abdominal pain, diarrhea, constipation, blood in stool and rectal pain.  Endocrine: Negative.  Negative for polydipsia, polyphagia and polyuria.  Genitourinary: Negative.   Musculoskeletal: Positive for arthralgias and neck pain. Negative for myalgias, back pain, joint swelling and gait problem.  Skin: Negative.  Negative for rash.  Allergic/Immunologic: Negative.   Neurological: Negative.   Hematological: Negative.  Negative for  adenopathy. Does not bruise/bleed easily.  Psychiatric/Behavioral: Negative for suicidal ideas, sleep disturbance, self-injury, dysphoric mood and decreased concentration. The patient is nervous/anxious.     Objective:  BP 124/70 mmHg  Pulse 81  Temp(Src) 98.6 F (37 C) (Oral)  Resp 16  Ht 5\' 6"  (1.676 m)  Wt 201 lb (91.173 kg)  BMI 32.46 kg/m2  SpO2 95%  Physical Exam  Constitutional:  She is oriented to person, place, and time. She appears well-developed and well-nourished. No distress.  HENT:  Head: Normocephalic and atraumatic.  Mouth/Throat: Oropharynx is clear and moist. No oropharyngeal exudate.  Eyes: Conjunctivae are normal. Right eye exhibits no discharge. Left eye exhibits no discharge. No scleral icterus.  Neck: Normal range of motion. Neck supple. No JVD present. No tracheal deviation present. No thyromegaly present.  Cardiovascular: Normal rate, normal heart sounds and intact distal pulses.  Exam reveals no gallop and no friction rub.   No murmur heard. Pulmonary/Chest: Effort normal. No stridor. No respiratory distress. She has no wheezes. She has no rales. She exhibits no tenderness.  Abdominal: Soft. Bowel sounds are normal. She exhibits no distension and no mass. There is no tenderness. There is no rebound and no guarding.  Musculoskeletal: Normal range of motion. She exhibits no edema or tenderness.       Right knee: She exhibits deformity (DJD). She exhibits normal range of motion, no effusion and no erythema. No tenderness found.       Left knee: She exhibits deformity (DJD). She exhibits normal range of motion, no swelling, no effusion and no erythema. No tenderness found.       Cervical back: Normal. She exhibits normal range of motion, no tenderness, no bony tenderness, no swelling, no edema, no deformity, no laceration, no pain, no spasm and normal pulse.       Left foot: There is swelling. There is normal range of motion, no bony tenderness, normal capillary refill, no crepitus, no deformity and no laceration.       Feet:  Lymphadenopathy:    She has no cervical adenopathy.  Neurological: She is oriented to person, place, and time. She has normal strength. She displays no atrophy, no tremor and normal reflexes. No cranial nerve deficit or sensory deficit. She exhibits normal muscle tone. She displays a negative Romberg sign. She displays no seizure  activity. Coordination and gait normal.  Skin: Skin is warm and dry. No rash noted. She is not diaphoretic. No erythema. No pallor.  Psychiatric: She has a normal mood and affect. Her behavior is normal. Judgment and thought content normal.  Vitals reviewed.     Assessment & Plan:   Alika was seen today for osteoarthritis.  Diagnoses and all orders for this visit:  Atherosclerosis of aorta Orders: -     Lipid panel; Future  PANIC DISORDER  Primary osteoarthritis involving multiple joints - will cont norco as needed Orders: -     Discontinue: HYDROcodone-acetaminophen (NORCO) 10-325 MG per tablet; Take 1 tablet by mouth 2 (two) times daily between meals as needed. -     HYDROcodone-acetaminophen (NORCO) 10-325 MG per tablet; Take 1 tablet by mouth every 6 (six) hours as needed.  Hyperglycemia - will check her A1C to see if she has developed DM2 Orders: -     Comprehensive metabolic panel; Future -     Urinalysis, Routine w reflex microscopic (not at [facility]); Future -     Hemoglobin A1c; Future  Hyperlipidemia with target LDL less than 100 Orders: -  Lipid panel; Future -     Comprehensive metabolic panel; Future -     CBC with Differential/Platelet; Future -     TSH; Future  Foot injury, left, initial encounter - plain film shows an insignificant fracture, no further treatment needed at this time Orders: -     DG Foot 2 Views Left; Future  History of colonic polyps Orders: -     Ambulatory referral to Gastroenterology   I have discontinued Ms. Eversley's HYDROcodone-acetaminophen, Fluticasone-Salmeterol, fluconazole, promethazine-codeine, amoxicillin, and amphetamine-dextroamphetamine. I have also changed her HYDROcodone-acetaminophen. Additionally, I am having her maintain her Compressor/Nebulizer, albuterol, esomeprazole, ibuprofen, ALPRAZolam, PROAIR HFA, and ADVAIR DISKUS.  Meds ordered this encounter  Medications  . ADVAIR DISKUS 500-50 MCG/DOSE AEPB     Sig: INHALE 1 PUFF BY MOUTH INTO THE LUNGS EVERY 12 HOURS (RINSE MOUTH AFTERWARDS)    Refill:  12  . DISCONTD: HYDROcodone-acetaminophen (NORCO) 10-325 MG per tablet    Sig: Take 1 tablet by mouth 2 (two) times daily between meals as needed.    Dispense:  60 tablet    Refill:  0  . HYDROcodone-acetaminophen (NORCO) 10-325 MG per tablet    Sig: Take 1 tablet by mouth every 6 (six) hours as needed.    Dispense:  120 tablet    Refill:  0     Follow-up: Return in about 3 weeks (around 02/11/2015).  Scarlette Calico, MD

## 2015-01-21 NOTE — Patient Instructions (Signed)
Foot Sprain The muscles and cord like structures which attach muscle to bone (tendons) that surround the feet are made up of units. A foot sprain can occur at the weakest spot in any of these units. This condition is most often caused by injury to or overuse of the foot, as from playing contact sports, or aggravating a previous injury, or from poor conditioning, or obesity. SYMPTOMS  Pain with movement of the foot.  Tenderness and swelling at the injury site.  Loss of strength is present in moderate or severe sprains. THE THREE GRADES OR SEVERITY OF FOOT SPRAIN ARE:  Mild (Grade I): Slightly pulled muscle without tearing of muscle or tendon fibers or loss of strength.  Moderate (Grade II): Tearing of fibers in a muscle, tendon, or at the attachment to bone, with small decrease in strength.  Severe (Grade III): Rupture of the muscle-tendon-bone attachment, with separation of fibers. Severe sprain requires surgical repair. Often repeating (chronic) sprains are caused by overuse. Sudden (acute) sprains are caused by direct injury or over-use. DIAGNOSIS  Diagnosis of this condition is usually by your own observation. If problems continue, a caregiver may be required for further evaluation and treatment. X-rays may be required to make sure there are not breaks in the bones (fractures) present. Continued problems may require physical therapy for treatment. PREVENTION  Use strength and conditioning exercises appropriate for your sport.  Warm up properly prior to working out.  Use athletic shoes that are made for the sport you are participating in.  Allow adequate time for healing. Early return to activities makes repeat injury more likely, and can lead to an unstable arthritic foot that can result in prolonged disability. Mild sprains generally heal in 3 to 10 days, with moderate and severe sprains taking 2 to 10 weeks. Your caregiver can help you determine the proper time required for  healing. HOME CARE INSTRUCTIONS   Apply ice to the injury for 15-20 minutes, 03-04 times per day. Put the ice in a plastic bag and place a towel between the bag of ice and your skin.  An elastic wrap (like an Ace bandage) may be used to keep swelling down.  Keep foot above the level of the heart, or at least raised on a footstool, when swelling and pain are present.  Try to avoid use other than gentle range of motion while the foot is painful. Do not resume use until instructed by your caregiver. Then begin use gradually, not increasing use to the point of pain. If pain does develop, decrease use and continue the above measures, gradually increasing activities that do not cause discomfort, until you gradually achieve normal use.  Use crutches if and as instructed, and for the length of time instructed.  Keep injured foot and ankle wrapped between treatments.  Massage foot and ankle for comfort and to keep swelling down. Massage from the toes up towards the knee.  Only take over-the-counter or prescription medicines for pain, discomfort, or fever as directed by your caregiver. SEEK IMMEDIATE MEDICAL CARE IF:   Your pain and swelling increase, or pain is not controlled with medications.  You have loss of feeling in your foot or your foot turns cold or blue.  You develop new, unexplained symptoms, or an increase of the symptoms that brought you to your caregiver. MAKE SURE YOU:   Understand these instructions.  Will watch your condition.  Will get help right away if you are not doing well or get worse. Document Released:   12/31/2001 Document Revised: 10/03/2011 Document Reviewed: 02/28/2008 Exit[employer] Patient Information 2015 [employer]. This information is not intended to replace advice given to you by your health care provider. Make sure you discuss any questions you have with your health care provider.  

## 2015-01-29 ENCOUNTER — Encounter: Payer: Self-pay | Admitting: Internal Medicine

## 2015-01-29 ENCOUNTER — Ambulatory Visit (INDEPENDENT_AMBULATORY_CARE_PROVIDER_SITE_OTHER): Payer: Medicare Other | Admitting: Internal Medicine

## 2015-01-29 VITALS — BP 112/70 | HR 79 | Ht 66.0 in | Wt 202.6 lb

## 2015-01-29 DIAGNOSIS — J449 Chronic obstructive pulmonary disease, unspecified: Secondary | ICD-10-CM | POA: Diagnosis not present

## 2015-01-29 DIAGNOSIS — J4530 Mild persistent asthma, uncomplicated: Secondary | ICD-10-CM | POA: Diagnosis not present

## 2015-01-29 DIAGNOSIS — J4489 Other specified chronic obstructive pulmonary disease: Secondary | ICD-10-CM

## 2015-01-29 DIAGNOSIS — F410 Panic disorder [episodic paroxysmal anxiety] without agoraphobia: Secondary | ICD-10-CM | POA: Diagnosis not present

## 2015-01-29 MED ORDER — AMPHETAMINE-DEXTROAMPHETAMINE 10 MG PO TABS: 10.0000 mg | ORAL_TABLET | Freq: Every day | ORAL | Status: DC

## 2015-01-29 MED ORDER — PROMETHAZINE-CODEINE 6.25-10 MG/5ML PO SYRP: 5.0000 mL | ORAL_SOLUTION | Freq: Four times a day (QID) | ORAL | Status: DC | PRN

## 2015-01-29 MED ORDER — ALPRAZOLAM 1 MG PO TABS: ORAL_TABLET | ORAL | Status: DC
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
## 2015-01-29 NOTE — Patient Instructions (Addendum)
Refill meds as discussed  Order- spirometry done  Please call if we can help

## 2015-02-01 NOTE — Assessment & Plan Note (Signed)
Office spirometry scores on a particularly good day are within normal ranges. Plan-continue current meds as discussed. Discussed activity and altitude in the mountains and what to expect.

## 2015-02-01 NOTE — Assessment & Plan Note (Signed)
We discussed her use of Adderall and Xanax. She has been, recently, not needing these as often. She is trying to establish with a new primary physician and these issues can be reevaluated when that happens.

## 2015-02-02 DIAGNOSIS — J449 Chronic obstructive pulmonary disease, unspecified: Secondary | ICD-10-CM | POA: Diagnosis not present

## 2015-02-11 ENCOUNTER — Encounter (INDEPENDENT_AMBULATORY_CARE_PROVIDER_SITE_OTHER): Payer: Self-pay

## 2015-02-11 ENCOUNTER — Encounter: Payer: Self-pay | Admitting: Internal Medicine

## 2015-02-11 ENCOUNTER — Ambulatory Visit (INDEPENDENT_AMBULATORY_CARE_PROVIDER_SITE_OTHER): Payer: Medicare Other | Admitting: Internal Medicine

## 2015-02-11 VITALS — BP 104/66 | HR 84 | Temp 98.2°F | Resp 16 | Ht 66.0 in | Wt 202.0 lb

## 2015-02-11 DIAGNOSIS — L219 Seborrheic dermatitis, unspecified: Secondary | ICD-10-CM

## 2015-02-11 DIAGNOSIS — L218 Other seborrheic dermatitis: Secondary | ICD-10-CM

## 2015-02-11 DIAGNOSIS — M150 Primary generalized (osteo)arthritis: Secondary | ICD-10-CM | POA: Diagnosis not present

## 2015-02-11 DIAGNOSIS — M159 Polyosteoarthritis, unspecified: Secondary | ICD-10-CM

## 2015-02-11 HISTORY — DX: Seborrheic dermatitis, unspecified: L21.9

## 2015-02-11 MED ORDER — HYDROCODONE-ACETAMINOPHEN 10-325 MG PO TABS: 1.0000 | ORAL_TABLET | Freq: Four times a day (QID) | ORAL | Status: DC | PRN

## 2015-02-11 MED ORDER — CALCIPOTRIENE-BETAMETH DIPROP 0.005-0.064 % EX SUSP: Freq: Every day | CUTANEOUS | Status: DC

## 2015-02-11 NOTE — Progress Notes (Signed)
Subjective:  Patient ID: Felicia Odom, female    DOB: 1956-06-29  Age: 59 y.o. MRN: [id number]  CC: Rash and Osteoarthritis   HPI MIZUKI HOEL presents for a recurrent scalp that she has had intermittently for several years. She previously saw a dermatologist and was diagnosed with scalp dermatitis. She has been using Taclonex with symptom relief. She wants to be referred back to dermatology for reevaluation. She also complains of persistent bilateral knee pain worse on the right than the left. She was previously told by orthopedic surgeon that she might need to have surgery for degenerative joint disease. She is interested in seeing orthopedic surgeon again.  Outpatient Prescriptions Prior to Visit  Medication Sig Dispense Refill  . ADVAIR DISKUS 500-50 MCG/DOSE AEPB INHALE 1 PUFF BY MOUTH INTO THE LUNGS EVERY 12 HOURS (RINSE MOUTH AFTERWARDS)  12  . albuterol (PROVENTIL) (2.5 MG/3ML) 0.083% nebulizer solution Take 3 mLs (2.5 mg total) by nebulization every 6 (six) hours. Dx COPD with bronchitis 75 mL 12  . ALPRAZolam (XANAX) 1 MG tablet 1 every 6 hours, only if really needed 120 tablet 3  . esomeprazole (NEXIUM) 40 MG capsule Take 1 capsule (40 mg total) by mouth daily. 30 capsule prn  . ibuprofen (ADVIL,MOTRIN) 800 MG tablet TAKE 1 TABLET BY MOUTH 2 TIMES DAILY WITH FOOD AS NEEDED FOR PAIN 60 tablet 3  . Nebulizers (COMPRESSOR/NEBULIZER) MISC As directed 1 each 0  . PROAIR HFA 108 (90 BASE) MCG/ACT inhaler INHALE 2 PUFFS BY MOUTH INTO THE LUNGS EVERY 4 (FOUR) HOURS AS NEEDED. 8.5 Inhaler 3  . amphetamine-dextroamphetamine (ADDERALL) 10 MG tablet Take 1 tablet (10 mg total) by mouth daily with breakfast. 30 tablet 0  . HYDROcodone-acetaminophen (NORCO) 10-325 MG per tablet Take 1 tablet by mouth every 6 (six) hours as needed. 120 tablet 0  . promethazine-codeine (PHENERGAN WITH CODEINE) 6.25-10 MG/5ML syrup Take 5 mLs by mouth every 6 (six) hours as needed for cough. 120 mL 0     No facility-administered medications prior to visit.    ROS Review of Systems  Constitutional: Negative.   HENT: Negative.   Eyes: Negative.   Respiratory: Negative.  Negative for cough, choking, shortness of breath and stridor.   Cardiovascular: Negative.  Negative for chest pain, palpitations and leg swelling.  Gastrointestinal: Negative.  Negative for nausea, vomiting, abdominal pain, diarrhea, constipation and blood in stool.  Endocrine: Negative.   Genitourinary: Negative.   Musculoskeletal: Positive for arthralgias. Negative for myalgias, back pain and neck pain.  Skin: Positive for rash.  Allergic/Immunologic: Negative.   Neurological: Negative.  Negative for dizziness, tremors, syncope, light-headedness, numbness and headaches.  Hematological: Negative.   Psychiatric/Behavioral: Negative.     Objective:  BP 104/66 mmHg  Pulse 84  Temp(Src) 98.2 F (36.8 C) (Oral)  Resp 16  Ht 5\' 6"  (1.676 m)  Wt 202 lb (91.627 kg)  BMI 32.62 kg/m2  SpO2 97%  BP Readings from Last 3 Encounters:  02/11/15 104/66  01/29/15 112/70  01/21/15 124/70    Wt Readings from Last 3 Encounters:  02/11/15 202 lb (91.627 kg)  01/29/15 202 lb 9.6 oz (91.899 kg)  01/21/15 201 lb (91.173 kg)    Physical Exam  Constitutional: She is oriented to person, place, and time. No distress.  HENT:  Mouth/Throat: Oropharynx is clear and moist. No oropharyngeal exudate.  Eyes: Conjunctivae are normal. Right eye exhibits no discharge. Left eye exhibits no discharge. No scleral icterus.  Neck: Normal range of  motion. Neck supple. No JVD present. No tracheal deviation present. No thyromegaly present.  Cardiovascular: Normal rate, regular rhythm, normal heart sounds and intact distal pulses.  Exam reveals no gallop and no friction rub.   No murmur heard. Pulmonary/Chest: Effort normal and breath sounds normal. No stridor. No respiratory distress. She has no wheezes. She has no rales. She exhibits no  tenderness.  Abdominal: Soft. Bowel sounds are normal. She exhibits no distension and no mass. There is no tenderness. There is no rebound and no guarding.  Musculoskeletal: Normal range of motion. She exhibits no edema or tenderness.       Right knee: She exhibits deformity (DJD). She exhibits normal range of motion, no swelling, no effusion, no ecchymosis, no laceration, no erythema, normal alignment, no LCL laxity, normal patellar mobility and no bony tenderness. No tenderness found.       Left knee: She exhibits deformity (DJD). She exhibits normal range of motion, no swelling, no effusion, no ecchymosis, no laceration, no erythema, normal alignment, no LCL laxity, normal patellar mobility and no bony tenderness. No tenderness found.  Lymphadenopathy:    She has no cervical adenopathy.  Neurological: She is oriented to person, place, and time.  Skin: Skin is warm and dry. Rash noted. She is not diaphoretic. No erythema. No pallor.  Over the right parietal scalp there are 2 small erythematous macules with faint scale.  Psychiatric: She has a normal mood and affect. Her behavior is normal. Judgment and thought content normal.  Vitals reviewed.   Lab Results  Component Value Date   WBC 9.1 01/21/2015   HGB 15.3* 01/21/2015   HCT 45.4 01/21/2015   PLT 312.0 01/21/2015   GLUCOSE 94 01/21/2015   CHOL 225* 01/21/2015   TRIG 236.0* 01/21/2015   HDL 46.90 01/21/2015   LDLDIRECT 142.0 01/21/2015   ALT 18 01/21/2015   AST 17 01/21/2015   NA 138 01/21/2015   K 4.0 01/21/2015   CL 101 01/21/2015   CREATININE 0.84 01/21/2015   BUN 16 01/21/2015   CO2 29 01/21/2015   TSH 1.73 01/21/2015   HGBA1C 5.7 01/21/2015    Dg Foot 2 Views Left  01/21/2015   CLINICAL DATA:  Foot injury 2 days ago involving the fifth toe. Swelling and bruising. Initial encounter.  EXAM: LEFT FOOT - 2 VIEW  COMPARISON:  None.  FINDINGS: There is a slight focal contour abnormality involving the lateral aspect of the  midshaft of the fifth toe proximal phalanx with obliquely oriented trabecular disruption extending across the shaft. A well-defined fracture line is not clearly identified, and evaluation is partially limited by superimposition of other toes on the lateral radiograph and by lack of an oblique radiograph. There is no other evidence of acute fracture or dislocation. Joint space widths are preserved. No lytic or blastic osseous lesion is seen. There is a tiny plantar calcaneal enthesophyte. No focal soft tissue abnormality.  IMPRESSION: Abnormal appearance of the shaft of the right fifth toe proximal phalanx, suspicious for nondisplaced fracture. Correlate for point tenderness and consider obtaining an oblique radiograph for confirmation.   Electronically Signed   By: Logan Bores   On: 01/21/2015 16:16    Assessment & Plan:   Ladasha was seen today for rash and osteoarthritis.  Diagnoses and all orders for this visit:  Primary osteoarthritis involving multiple joints Orders: -     Discontinue: HYDROcodone-acetaminophen (NORCO) 10-325 MG per tablet; Take 1 tablet by mouth every 6 (six) hours as needed. -  Discontinue: HYDROcodone-acetaminophen (NORCO) 10-325 MG per tablet; Take 1 tablet by mouth every 6 (six) hours as needed. -     Discontinue: HYDROcodone-acetaminophen (NORCO) 10-325 MG per tablet; Take 1 tablet by mouth every 6 (six) hours as needed. -     HYDROcodone-acetaminophen (NORCO) 10-325 MG per tablet; Take 1 tablet by mouth every 6 (six) hours as needed. -     Ambulatory referral to Orthopedic Surgery  Seborrheic dermatitis of scalp Orders: -     calcipotriene-betamethasone (TACLONEX SCALP) external suspension; Apply topically daily. -     Ambulatory referral to Dermatology   I have discontinued Ms. Ogan's amphetamine-dextroamphetamine and promethazine-codeine. I am also having her start on calcipotriene-betamethasone. Additionally, I am having her maintain her  Compressor/Nebulizer, albuterol, esomeprazole, ibuprofen, PROAIR HFA, ADVAIR DISKUS, ALPRAZolam, and HYDROcodone-acetaminophen.  Meds ordered this encounter  Medications  . DISCONTD: HYDROcodone-acetaminophen (NORCO) 10-325 MG per tablet    Sig: Take 1 tablet by mouth every 6 (six) hours as needed.    Dispense:  90 tablet    Refill:  0    Fill on or after 02/11/15  . DISCONTD: HYDROcodone-acetaminophen (NORCO) 10-325 MG per tablet    Sig: Take 1 tablet by mouth every 6 (six) hours as needed.    Dispense:  90 tablet    Refill:  0    Fill on or after 03/14/15  . DISCONTD: HYDROcodone-acetaminophen (NORCO) 10-325 MG per tablet    Sig: Take 1 tablet by mouth every 6 (six) hours as needed.    Dispense:  90 tablet    Refill:  0    Fill on or after 04/14/15  . HYDROcodone-acetaminophen (NORCO) 10-325 MG per tablet    Sig: Take 1 tablet by mouth every 6 (six) hours as needed.    Dispense:  90 tablet    Refill:  0    Fill on or after 05/14/15  . calcipotriene-betamethasone (TACLONEX SCALP) external suspension    Sig: Apply topically daily.    Dispense:  60 g    Refill:  1     Follow-up: Return in about 4 months (around 06/14/2015).  Scarlette Calico, MD

## 2015-02-11 NOTE — Progress Notes (Signed)
Pre visit review using our clinic review tool, if applicable. No additional management support is needed unless otherwise documented below in the visit note. 

## 2015-02-11 NOTE — Patient Instructions (Signed)

## 2015-02-12 DIAGNOSIS — J449 Chronic obstructive pulmonary disease, unspecified: Secondary | ICD-10-CM | POA: Diagnosis not present

## 2015-02-17 ENCOUNTER — Telehealth: Payer: Self-pay | Admitting: Internal Medicine

## 2015-02-17 DIAGNOSIS — M79641 Pain in right hand: Secondary | ICD-10-CM | POA: Diagnosis not present

## 2015-02-17 DIAGNOSIS — M542 Cervicalgia: Secondary | ICD-10-CM | POA: Diagnosis not present

## 2015-02-17 DIAGNOSIS — M25561 Pain in right knee: Secondary | ICD-10-CM | POA: Diagnosis not present

## 2015-02-17 MED ORDER — AMPHETAMINE-DEXTROAMPHETAMINE 10 MG PO TABS: 10.0000 mg | ORAL_TABLET | Freq: Every day | ORAL | Status: DC

## 2015-02-17 NOTE — Telephone Encounter (Signed)
Ok to refill adderall 

## 2015-02-17 NOTE — Telephone Encounter (Signed)
Called spoke w/ pt. She is requesting refill on adderall 10 mg. Last refilled 01/29/15 #30 x 0 refills Take 1 tablet (10 mg total) by mouth daily with breakfast Pt reports her PCP accidentally took this off her med list and never put it back on there but she does still take this.  Please advise on refill Dr. Annamaria Boots thanks

## 2015-02-17 NOTE — Telephone Encounter (Signed)
rx printed, signed, and placed up front for pickup. Pt aware, will pick this up this afternoon.  Nothing further needed at this time.

## 2015-02-24 ENCOUNTER — Other Ambulatory Visit: Payer: Self-pay | Admitting: Internal Medicine

## 2015-02-24 DIAGNOSIS — M25561 Pain in right knee: Secondary | ICD-10-CM | POA: Diagnosis not present

## 2015-02-24 DIAGNOSIS — M542 Cervicalgia: Secondary | ICD-10-CM | POA: Diagnosis not present

## 2015-02-24 NOTE — Telephone Encounter (Signed)
CY Please advise on refill. Thanks.  

## 2015-02-24 NOTE — Telephone Encounter (Signed)
Ok to refill 

## 2015-02-26 DIAGNOSIS — M542 Cervicalgia: Secondary | ICD-10-CM | POA: Diagnosis not present

## 2015-02-26 DIAGNOSIS — M25561 Pain in right knee: Secondary | ICD-10-CM | POA: Diagnosis not present

## 2015-03-05 DIAGNOSIS — M47812 Spondylosis without myelopathy or radiculopathy, cervical region: Secondary | ICD-10-CM | POA: Diagnosis not present

## 2015-03-05 DIAGNOSIS — M542 Cervicalgia: Secondary | ICD-10-CM | POA: Diagnosis not present

## 2015-03-05 DIAGNOSIS — J449 Chronic obstructive pulmonary disease, unspecified: Secondary | ICD-10-CM | POA: Diagnosis not present

## 2015-03-10 DIAGNOSIS — J449 Chronic obstructive pulmonary disease, unspecified: Secondary | ICD-10-CM | POA: Diagnosis not present

## 2015-03-17 ENCOUNTER — Telehealth: Payer: Self-pay | Admitting: Internal Medicine

## 2015-03-17 NOTE — Telephone Encounter (Signed)
Called and spoke to pt. Pt requesting refill on Adderall 10 mg (1 tab qd). Last filled on 7.26.16 for #30 with 0 refills. Pt last seen on 7.7.16 by CY, upcoming appt is on 1.9.17.  Dr. Annamaria Boots please advise.   No Known Allergies  Current Outpatient Prescriptions on File Prior to Visit  Medication Sig Dispense Refill  . ADVAIR DISKUS 500-50 MCG/DOSE AEPB INHALE 1 PUFF BY MOUTH INTO THE LUNGS EVERY 12 HOURS (RINSE MOUTH AFTERWARDS)  12  . albuterol (PROVENTIL) (2.5 MG/3ML) 0.083% nebulizer solution Take 3 mLs (2.5 mg total) by nebulization every 6 (six) hours. Dx COPD with bronchitis 75 mL 12  . ALPRAZolam (XANAX) 1 MG tablet 1 every 6 hours, only if really needed 120 tablet 3  . amphetamine-dextroamphetamine (ADDERALL) 10 MG tablet Take 1 tablet (10 mg total) by mouth daily with breakfast. 30 tablet 0  . calcipotriene-betamethasone (TACLONEX SCALP) external suspension Apply topically daily. 60 g 1  . esomeprazole (NEXIUM) 40 MG capsule Take 1 capsule (40 mg total) by mouth daily. 30 capsule prn  . HYDROcodone-acetaminophen (NORCO) 10-325 MG per tablet Take 1 tablet by mouth every 6 (six) hours as needed. 90 tablet 0  . ibuprofen (ADVIL,MOTRIN) 800 MG tablet TAKE 1 TABLET BY MOUTH TWICE A DAY WITH FOOD AS NEEDED FOR PAIN 60 tablet 3  . Nebulizers (COMPRESSOR/NEBULIZER) MISC As directed 1 each 0  . PROAIR HFA 108 (90 BASE) MCG/ACT inhaler INHALE 2 PUFFS BY MOUTH INTO THE LUNGS EVERY 4 (FOUR) HOURS AS NEEDED. 8.5 Inhaler 3  . [DISCONTINUED] estradiol (VIVELLE-DOT) 0.0375 MG/24HR Place 1 patch onto the skin 2 (two) times a week.      . [DISCONTINUED] ipratropium (ATROVENT) 0.02 % nebulizer solution Take 2.5 mLs (500 mcg total) by nebulization 4 (four) times daily. DX:  496 300 mL 5  . [DISCONTINUED] progesterone (PROMETRIUM) 100 MG capsule Take 100 mg by mouth daily.       No current facility-administered medications on file prior to visit.

## 2015-03-17 NOTE — Telephone Encounter (Signed)
Ok to refill 

## 2015-03-18 MED ORDER — AMPHETAMINE-DEXTROAMPHETAMINE 10 MG PO TABS: 10.0000 mg | ORAL_TABLET | Freq: Every day | ORAL | Status: DC

## 2015-03-18 NOTE — Telephone Encounter (Signed)
Rx was printed, signed and placed up front for pick up  Left pt a detailed msg letting her know

## 2015-03-26 DIAGNOSIS — M222X1 Patellofemoral disorders, right knee: Secondary | ICD-10-CM | POA: Diagnosis not present

## 2015-04-03 DIAGNOSIS — J449 Chronic obstructive pulmonary disease, unspecified: Secondary | ICD-10-CM | POA: Diagnosis not present

## 2015-04-05 DIAGNOSIS — J449 Chronic obstructive pulmonary disease, unspecified: Secondary | ICD-10-CM | POA: Diagnosis not present

## 2015-04-16 ENCOUNTER — Telehealth: Payer: Self-pay | Admitting: Internal Medicine

## 2015-04-16 DIAGNOSIS — L738 Other specified follicular disorders: Secondary | ICD-10-CM | POA: Diagnosis not present

## 2015-04-16 DIAGNOSIS — L820 Inflamed seborrheic keratosis: Secondary | ICD-10-CM | POA: Diagnosis not present

## 2015-04-16 DIAGNOSIS — L814 Other melanin hyperpigmentation: Secondary | ICD-10-CM | POA: Diagnosis not present

## 2015-04-16 DIAGNOSIS — D225 Melanocytic nevi of trunk: Secondary | ICD-10-CM | POA: Diagnosis not present

## 2015-04-16 MED ORDER — AMPHETAMINE-DEXTROAMPHETAMINE 10 MG PO TABS: 10.0000 mg | ORAL_TABLET | Freq: Every day | ORAL | Status: DC

## 2015-04-16 NOTE — Telephone Encounter (Signed)
Spoke with pt. Advised her that we will get her prescription ready for her. States that she will pick this up on Monday. Rx will be left at the front desk.

## 2015-04-16 NOTE — Telephone Encounter (Signed)
Ok to refill 

## 2015-04-16 NOTE — Telephone Encounter (Signed)
Spoke with pt, requesting a refill on Adderall 10mg .   Last refilled #30- take 1 tab daily with 0 refills.  Last filled on 03/18/2015. Pt wishes to pick this up in the office.  CY please advise on refill approval.  Thanks!

## 2015-04-25 ENCOUNTER — Other Ambulatory Visit: Payer: Self-pay | Admitting: Internal Medicine

## 2015-04-28 DIAGNOSIS — M542 Cervicalgia: Secondary | ICD-10-CM | POA: Diagnosis not present

## 2015-04-28 DIAGNOSIS — M47812 Spondylosis without myelopathy or radiculopathy, cervical region: Secondary | ICD-10-CM | POA: Diagnosis not present

## 2015-04-29 DIAGNOSIS — J449 Chronic obstructive pulmonary disease, unspecified: Secondary | ICD-10-CM | POA: Diagnosis not present

## 2015-05-05 DIAGNOSIS — J449 Chronic obstructive pulmonary disease, unspecified: Secondary | ICD-10-CM | POA: Diagnosis not present

## 2015-05-10 ENCOUNTER — Other Ambulatory Visit: Payer: Self-pay | Admitting: Internal Medicine

## 2015-05-18 ENCOUNTER — Telehealth: Payer: Self-pay | Admitting: Internal Medicine

## 2015-05-18 MED ORDER — AMPHETAMINE-DEXTROAMPHETAMINE 10 MG PO TABS: 10.0000 mg | ORAL_TABLET | Freq: Every day | ORAL | Status: DC

## 2015-05-18 MED ORDER — PROMETHAZINE-CODEINE 6.25-10 MG/5ML PO SYRP: 5.0000 mL | ORAL_SOLUTION | Freq: Four times a day (QID) | ORAL | Status: DC | PRN

## 2015-05-18 NOTE — Telephone Encounter (Signed)
[phone] calling back

## 2015-05-18 NOTE — Telephone Encounter (Signed)
Ok to refill as requested 

## 2015-05-18 NOTE — Telephone Encounter (Signed)
lmomtcb x1 for pt 

## 2015-05-18 NOTE — Telephone Encounter (Signed)
Ok to refill alprazolam

## 2015-05-18 NOTE — Telephone Encounter (Signed)
Pt needs a refill on her adderall and promethazine-codeine cough syrup.  Pt requesting 227mL of this.  Pt picks these up in the office.   Last refill on adderall: 10mg  #30 with 0 refills on 04/16/15 Last refill on promethazine-codeine cough syrup: not on pt's current med list but last filled for 161mL on 01/29/15 with 0 refills.   CY please advise on refill.  Thanks!

## 2015-05-18 NOTE — Telephone Encounter (Signed)
Spoke with pt, states she only needs the adderall and cough syrup, not the xanax.  States that pharmacist told her today she has one more refill on this.   adderall and cough syrup printed and placed on CY's cart for signature.  Pt will pick this up tomorrow.  Nothing further needed.

## 2015-05-18 NOTE — Telephone Encounter (Signed)
Pt also requesting alprazolam 1 mg refill Last refilled 01/29/15 #120 x 3 refills Please advise thanks

## 2015-05-21 ENCOUNTER — Other Ambulatory Visit: Payer: Self-pay | Admitting: Internal Medicine

## 2015-05-22 DIAGNOSIS — J449 Chronic obstructive pulmonary disease, unspecified: Secondary | ICD-10-CM | POA: Diagnosis not present

## 2015-06-05 DIAGNOSIS — J449 Chronic obstructive pulmonary disease, unspecified: Secondary | ICD-10-CM | POA: Diagnosis not present

## 2015-06-15 ENCOUNTER — Encounter: Payer: Self-pay | Admitting: Internal Medicine

## 2015-06-15 ENCOUNTER — Ambulatory Visit (INDEPENDENT_AMBULATORY_CARE_PROVIDER_SITE_OTHER): Payer: Medicare Other | Admitting: Internal Medicine

## 2015-06-15 ENCOUNTER — Telehealth: Payer: Self-pay | Admitting: Internal Medicine

## 2015-06-15 VITALS — BP 138/82 | HR 85 | Temp 98.2°F | Resp 20 | Ht 66.0 in | Wt 205.0 lb

## 2015-06-15 DIAGNOSIS — E785 Hyperlipidemia, unspecified: Secondary | ICD-10-CM

## 2015-06-15 DIAGNOSIS — M159 Polyosteoarthritis, unspecified: Secondary | ICD-10-CM

## 2015-06-15 DIAGNOSIS — M150 Primary generalized (osteo)arthritis: Secondary | ICD-10-CM | POA: Diagnosis not present

## 2015-06-15 DIAGNOSIS — M5030 Other cervical disc degeneration, unspecified cervical region: Secondary | ICD-10-CM | POA: Diagnosis not present

## 2015-06-15 HISTORY — DX: Other cervical disc degeneration, unspecified cervical region: M50.30

## 2015-06-15 MED ORDER — HYDROCODONE-ACETAMINOPHEN 10-325 MG PO TABS: 1.0000 | ORAL_TABLET | Freq: Four times a day (QID) | ORAL | Status: DC | PRN

## 2015-06-15 MED ORDER — ALPRAZOLAM 1 MG PO TABS: ORAL_TABLET | ORAL | Status: DC

## 2015-06-15 NOTE — Telephone Encounter (Signed)
Spoke with pt. Aware we are awaiting response.

## 2015-06-15 NOTE — Telephone Encounter (Signed)
Pt aware of CY's recs.  Alprazolam refilled.  Pt will pick up from office tomorrow.  Nothing further needed.

## 2015-06-15 NOTE — Patient Instructions (Signed)

## 2015-06-15 NOTE — Telephone Encounter (Signed)
Pt returning call.Felicia Odom ° °

## 2015-06-15 NOTE — Telephone Encounter (Signed)
Pt adderall was d/c'd today by Dr. Ronnald Ramp according to medication list. Called spoke with pt. She reports she is still on the adderall and not sure why this was taken off by PCP. Pt is requesting refill on alprazolam 1 mg and adderall 10 mg. Pt last had refill on alprazolam 01/29/15 #120 x 3 refills 1 every 6 hours, only if really needed  Pt last had adderall refilled 10/24 #30 x 0 refills QD w/ breakfast  Please advise Dr. Annamaria Boots thanks

## 2015-06-15 NOTE — Progress Notes (Signed)
Subjective:  Patient ID: Felicia Odom, female    DOB: 02-05-1956  Age: 59 y.o. MRN: [id number]  CC: Osteoarthritis   HPI DIYALA AMBRIZ presents for complaints of joint pain in her knees. She tells me that Norco 3 times a day does not control the pain and she needs to increase the dose to 4 times a day. She also complains of neck pain. She recently saw an orthopedist and was told that x-ray showed degenerative disc disease. She is not interested in having surgery on her neck or her knees to relieve her discomfort.  Outpatient Prescriptions Prior to Visit  Medication Sig Dispense Refill  . ADVAIR DISKUS 500-50 MCG/DOSE AEPB INHALE 1 PUFF BY MOUTH INTO THE LUNGS EVERY 12 HOURS (RINSE MOUTH AFTERWARDS) 60 each 4  . albuterol (PROVENTIL) (2.5 MG/3ML) 0.083% nebulizer solution Take 3 mLs (2.5 mg total) by nebulization every 6 (six) hours. Dx COPD with bronchitis 75 mL 12  . esomeprazole (NEXIUM) 40 MG capsule Take 1 capsule (40 mg total) by mouth daily. 30 capsule prn  . ibuprofen (ADVIL,MOTRIN) 800 MG tablet TAKE 1 TABLET BY MOUTH TWICE A DAY WITH FOOD AS NEEDED FOR PAIN 60 tablet 3  . Nebulizers (COMPRESSOR/NEBULIZER) MISC As directed 1 each 0  . PROAIR HFA 108 (90 BASE) MCG/ACT inhaler INHALE 2 PUFFS BY MOUTH INTO THE LUNGS EVERY 4 (FOUR) HOURS AS NEEDED. 8.5 Inhaler 3  . ALPRAZolam (XANAX) 1 MG tablet 1 every 6 hours, only if really needed 120 tablet 3  . amphetamine-dextroamphetamine (ADDERALL) 10 MG tablet Take 1 tablet (10 mg total) by mouth daily with breakfast. 30 tablet 0  . HYDROcodone-acetaminophen (NORCO) 10-325 MG per tablet Take 1 tablet by mouth every 6 (six) hours as needed. 90 tablet 0  . promethazine-codeine (PHENERGAN WITH CODEINE) 6.25-10 MG/5ML syrup Take 5 mLs by mouth every 6 (six) hours as needed for cough. 240 mL 0  . calcipotriene-betamethasone (TACLONEX SCALP) external suspension Apply topically daily. (Patient not taking: Reported on 06/15/2015) 60 g 1    No facility-administered medications prior to visit.    ROS Review of Systems  Constitutional: Negative.  Negative for fever, chills, diaphoresis, appetite change and fatigue.  HENT: Negative.   Eyes: Negative.   Respiratory: Negative.  Negative for cough, choking, chest tightness, shortness of breath and stridor.   Cardiovascular: Negative.  Negative for chest pain, palpitations and leg swelling.  Gastrointestinal: Negative.  Negative for nausea, vomiting, abdominal pain, diarrhea, constipation and blood in stool.  Endocrine: Negative.   Genitourinary: Negative.   Musculoskeletal: Positive for arthralgias and neck pain. Negative for myalgias, back pain, joint swelling, gait problem and neck stiffness.  Skin: Negative.   Allergic/Immunologic: Negative.   Neurological: Negative.  Negative for dizziness, tremors, syncope, weakness, light-headedness, numbness and headaches.  Hematological: Negative.  Does not bruise/bleed easily.  Psychiatric/Behavioral: Negative.     Objective:  BP 138/82 mmHg  Pulse 85  Temp(Src) 98.2 F (36.8 C) (Oral)  Resp 20  Ht 5\' 6"  (1.676 m)  Wt 205 lb (92.987 kg)  BMI 33.10 kg/m2  SpO2 98%  BP Readings from Last 3 Encounters:  06/15/15 138/82  02/11/15 104/66  01/29/15 112/70    Wt Readings from Last 3 Encounters:  06/15/15 205 lb (92.987 kg)  02/11/15 202 lb (91.627 kg)  01/29/15 202 lb 9.6 oz (91.899 kg)    Physical Exam  Constitutional: She is oriented to person, place, and time. No distress.  HENT:  Mouth/Throat: Oropharynx is clear  and moist. No oropharyngeal exudate.  Eyes: Conjunctivae are normal. Right eye exhibits no discharge. Left eye exhibits no discharge. No scleral icterus.  Neck: Normal range of motion. Neck supple. No JVD present. No tracheal deviation present. No thyromegaly present.  Cardiovascular: Normal rate, regular rhythm, normal heart sounds and intact distal pulses.  Exam reveals no gallop and no friction rub.    No murmur heard. Pulmonary/Chest: Effort normal and breath sounds normal. No stridor. No respiratory distress. She has no wheezes. She has no rales. She exhibits no tenderness.  Abdominal: Soft. Bowel sounds are normal. She exhibits no distension and no mass. There is no tenderness. There is no rebound and no guarding.  Musculoskeletal: Normal range of motion. She exhibits no edema or tenderness.       Right knee: She exhibits deformity (DJD). She exhibits normal range of motion, no swelling, no effusion, no ecchymosis and no bony tenderness. No tenderness found.       Left knee: She exhibits deformity (DJD). She exhibits normal range of motion, no swelling, no effusion, no ecchymosis and no bony tenderness. No tenderness found.       Cervical back: Normal. She exhibits normal range of motion, no tenderness, no bony tenderness, no swelling, no edema, no deformity, no pain and no spasm.  Lymphadenopathy:    She has no cervical adenopathy.  Neurological: She is oriented to person, place, and time.  Skin: Skin is warm and dry. No rash noted. She is not diaphoretic. No erythema. No pallor.  Vitals reviewed.   Lab Results  Component Value Date   WBC 9.1 01/21/2015   HGB 15.3* 01/21/2015   HCT 45.4 01/21/2015   PLT 312.0 01/21/2015   GLUCOSE 94 01/21/2015   CHOL 225* 01/21/2015   TRIG 236.0* 01/21/2015   HDL 46.90 01/21/2015   LDLDIRECT 142.0 01/21/2015   ALT 18 01/21/2015   AST 17 01/21/2015   NA 138 01/21/2015   K 4.0 01/21/2015   CL 101 01/21/2015   CREATININE 0.84 01/21/2015   BUN 16 01/21/2015   CO2 29 01/21/2015   TSH 1.73 01/21/2015   HGBA1C 5.7 01/21/2015    Dg Foot 2 Views Left  01/21/2015  CLINICAL DATA:  Foot injury 2 days ago involving the fifth toe. Swelling and bruising. Initial encounter. EXAM: LEFT FOOT - 2 VIEW COMPARISON:  None. FINDINGS: There is a slight focal contour abnormality involving the lateral aspect of the midshaft of the fifth toe proximal phalanx with  obliquely oriented trabecular disruption extending across the shaft. A well-defined fracture line is not clearly identified, and evaluation is partially limited by superimposition of other toes on the lateral radiograph and by lack of an oblique radiograph. There is no other evidence of acute fracture or dislocation. Joint space widths are preserved. No lytic or blastic osseous lesion is seen. There is a tiny plantar calcaneal enthesophyte. No focal soft tissue abnormality. IMPRESSION: Abnormal appearance of the shaft of the right fifth toe proximal phalanx, suspicious for nondisplaced fracture. Correlate for point tenderness and consider obtaining an oblique radiograph for confirmation. Electronically Signed   By: Logan Bores   On: 01/21/2015 16:16    Assessment & Plan:   Anea was seen today for osteoarthritis.  Diagnoses and all orders for this visit:  Primary osteoarthritis involving multiple joints -     Discontinue: HYDROcodone-acetaminophen (NORCO) 10-325 MG tablet; Take 1 tablet by mouth every 6 (six) hours as needed. -     Discontinue: HYDROcodone-acetaminophen ([place]) 10-325  MG tablet; Take 1 tablet by mouth every 6 (six) hours as needed. -     HYDROcodone-acetaminophen (NORCO) 10-325 MG tablet; Take 1 tablet by mouth every 6 (six) hours as needed.  DDD (degenerative disc disease), cervical -     Discontinue: HYDROcodone-acetaminophen (NORCO) 10-325 MG tablet; Take 1 tablet by mouth every 6 (six) hours as needed. -     Discontinue: HYDROcodone-acetaminophen (NORCO) 10-325 MG tablet; Take 1 tablet by mouth every 6 (six) hours as needed. -     HYDROcodone-acetaminophen (NORCO) 10-325 MG tablet; Take 1 tablet by mouth every 6 (six) hours as needed.  Hyperlipidemia with target LDL less than 100   I have discontinued Ms. Baskins's calcipotriene-betamethasone, amphetamine-dextroamphetamine, and promethazine-codeine. I am also having her maintain her Compressor/Nebulizer, albuterol,  esomeprazole, PROAIR HFA, ibuprofen, ADVAIR DISKUS, and HYDROcodone-acetaminophen.  Meds ordered this encounter  Medications  . DISCONTD: HYDROcodone-acetaminophen (NORCO) 10-325 MG tablet    Sig: Take 1 tablet by mouth every 6 (six) hours as needed.    Dispense:  120 tablet    Refill:  0    Fill on or after 06/15/15  . DISCONTD: HYDROcodone-acetaminophen (NORCO) 10-325 MG tablet    Sig: Take 1 tablet by mouth every 6 (six) hours as needed.    Dispense:  120 tablet    Refill:  0    Fill on or after 07/15/15  . HYDROcodone-acetaminophen (NORCO) 10-325 MG tablet    Sig: Take 1 tablet by mouth every 6 (six) hours as needed.    Dispense:  120 tablet    Refill:  0    Fill on or after 08/15/15     Follow-up: Return in about 4 months (around 10/13/2015).  Scarlette Calico, MD

## 2015-06-15 NOTE — Progress Notes (Signed)
Pre visit review using our clinic review tool, if applicable. No additional management support is needed unless otherwise documented below in the visit note. 

## 2015-06-15 NOTE — Telephone Encounter (Signed)
I have reviewed this issue and agree it is time to stop adderal.  If she feels she needs it, she will need to explain that to Dr Ronnald Ramp.  Ok to refill alprazolam as before

## 2015-06-22 DIAGNOSIS — J449 Chronic obstructive pulmonary disease, unspecified: Secondary | ICD-10-CM | POA: Diagnosis not present

## 2015-07-05 DIAGNOSIS — J449 Chronic obstructive pulmonary disease, unspecified: Secondary | ICD-10-CM | POA: Diagnosis not present

## 2015-07-13 ENCOUNTER — Other Ambulatory Visit: Payer: Self-pay | Admitting: Internal Medicine

## 2015-07-13 NOTE — Telephone Encounter (Signed)
It would be best to get all non-pulmonary meds from her PCP now please, since she has established with one.

## 2015-07-13 NOTE — Telephone Encounter (Signed)
CY please advise on refill  

## 2015-07-14 ENCOUNTER — Telehealth: Payer: Self-pay | Admitting: *Deleted

## 2015-07-14 MED ORDER — IBUPROFEN 800 MG PO TABS: ORAL_TABLET | ORAL | Status: DC

## 2015-07-14 NOTE — Telephone Encounter (Signed)
Receive call pt states she is needing refill on her Ibuprofen. Verified pharmacy inform will send to [facility].../lmb

## 2015-07-15 DIAGNOSIS — J449 Chronic obstructive pulmonary disease, unspecified: Secondary | ICD-10-CM | POA: Diagnosis not present

## 2015-08-03 ENCOUNTER — Ambulatory Visit: Payer: Medicare Other | Admitting: Internal Medicine

## 2015-08-05 ENCOUNTER — Telehealth: Payer: Self-pay | Admitting: Internal Medicine

## 2015-08-05 ENCOUNTER — Other Ambulatory Visit: Payer: Self-pay | Admitting: Internal Medicine

## 2015-08-05 ENCOUNTER — Ambulatory Visit (INDEPENDENT_AMBULATORY_CARE_PROVIDER_SITE_OTHER)
Admission: RE | Admit: 2015-08-05 | Discharge: 2015-08-05 | Disposition: A | Discharge status: Home / Self Care | Payer: Medicare Other | Source: Ambulatory Visit | Attending: Internal Medicine | Admitting: Internal Medicine

## 2015-08-05 ENCOUNTER — Ambulatory Visit (INDEPENDENT_AMBULATORY_CARE_PROVIDER_SITE_OTHER): Payer: Medicare Other | Admitting: Internal Medicine

## 2015-08-05 ENCOUNTER — Encounter: Payer: Self-pay | Admitting: Internal Medicine

## 2015-08-05 VITALS — BP 120/62 | HR 84 | Temp 97.6°F | Ht 66.0 in | Wt 199.0 lb

## 2015-08-05 DIAGNOSIS — H9192 Unspecified hearing loss, left ear: Secondary | ICD-10-CM | POA: Insufficient documentation

## 2015-08-05 DIAGNOSIS — R05 Cough: Secondary | ICD-10-CM | POA: Diagnosis not present

## 2015-08-05 DIAGNOSIS — J320 Chronic maxillary sinusitis: Secondary | ICD-10-CM | POA: Diagnosis not present

## 2015-08-05 DIAGNOSIS — J322 Chronic ethmoidal sinusitis: Secondary | ICD-10-CM | POA: Diagnosis not present

## 2015-08-05 DIAGNOSIS — H9202 Otalgia, left ear: Secondary | ICD-10-CM

## 2015-08-05 DIAGNOSIS — R911 Solitary pulmonary nodule: Secondary | ICD-10-CM

## 2015-08-05 DIAGNOSIS — H6502 Acute serous otitis media, left ear: Secondary | ICD-10-CM | POA: Diagnosis not present

## 2015-08-05 DIAGNOSIS — H6122 Impacted cerumen, left ear: Secondary | ICD-10-CM | POA: Diagnosis not present

## 2015-08-05 DIAGNOSIS — R059 Cough, unspecified: Secondary | ICD-10-CM

## 2015-08-05 DIAGNOSIS — R062 Wheezing: Secondary | ICD-10-CM | POA: Diagnosis not present

## 2015-08-05 DIAGNOSIS — R509 Fever, unspecified: Secondary | ICD-10-CM | POA: Diagnosis not present

## 2015-08-05 DIAGNOSIS — J449 Chronic obstructive pulmonary disease, unspecified: Secondary | ICD-10-CM | POA: Diagnosis not present

## 2015-08-05 DIAGNOSIS — H6062 Unspecified chronic otitis externa, left ear: Secondary | ICD-10-CM | POA: Diagnosis not present

## 2015-08-05 MED ORDER — PROMETHAZINE-CODEINE 6.25-10 MG/5ML PO SYRP: 5.0000 mL | ORAL_SOLUTION | ORAL | Status: DC | PRN

## 2015-08-05 MED ORDER — AMOXICILLIN-POT CLAVULANATE 875-125 MG PO TABS: 1.0000 | ORAL_TABLET | Freq: Two times a day (BID) | ORAL | Status: DC

## 2015-08-05 MED ORDER — METHYLPREDNISOLONE ACETATE 80 MG/ML IJ SUSP
80.0000 mg | Freq: Once | INTRAMUSCULAR | Status: AC
  Administered 2015-08-05: 80 mg via INTRAMUSCULAR

## 2015-08-05 MED ORDER — PREDNISONE 10 MG PO TABS: ORAL_TABLET | ORAL | Status: DC

## 2015-08-05 MED ORDER — TRAMADOL HCL 50 MG PO TABS: 50.0000 mg | ORAL_TABLET | Freq: Three times a day (TID) | ORAL | Status: DC | PRN

## 2015-08-05 MED ORDER — CEFTRIAXONE SODIUM 1 G IJ SOLR
1.0000 g | Freq: Once | INTRAMUSCULAR | Status: AC
  Administered 2015-08-05: 1 g via INTRAMUSCULAR

## 2015-08-05 MED ORDER — HYDROCODONE-HOMATROPINE 5-1.5 MG/5ML PO SYRP: 5.0000 mL | ORAL_SOLUTION | Freq: Four times a day (QID) | ORAL | Status: DC | PRN

## 2015-08-05 NOTE — Progress Notes (Signed)
Pre visit review using our clinic review tool, if applicable. No additional management support is needed unless otherwise documented below in the visit note. 

## 2015-08-05 NOTE — Patient Instructions (Addendum)
You had the steroid shot today, and the antibiotic shot today  Please take all new medication as prescribed - the antibiotic, cough medicine, and prednisone  Please continue all other medications as before, including your breathing treatments at home  Please have the pharmacy call with any other refills you may need.  You will be contacted regarding the referral for: ENT  - to see PCC's now  Please keep your appointments with your specialists as you may have planned  Please go to the XRAY Department in the Basement (go straight as you get off the elevator) for the x-ray testing  You will be contacted by phone if any changes need to be made immediately.  Otherwise, you will receive a letter about your results with an explanation, but please check with MyChart first.  Please remember to sign up for MyChart if you have not done so, as this will be important to you in the future with finding out test results, communicating by private email, and scheduling acute appointments online when needed.

## 2015-08-05 NOTE — Assessment & Plan Note (Signed)
Already on norco, to cont this for now,  to f/u any worsening symptoms or concerns

## 2015-08-05 NOTE — Addendum Note (Signed)
Addended by: Lyman Bishop on: 08/05/2015 12:01 PM   Modules accepted: Orders

## 2015-08-05 NOTE — Addendum Note (Signed)
Addended by: Lyman Bishop on: 08/05/2015 12:03 PM   Modules accepted: Orders

## 2015-08-05 NOTE — Assessment & Plan Note (Signed)
Mild to mod, c/w bronchtiis/uri vs pna, for cxr, for rocephin IM now, and po antibx course - augmentin, also cough med prn,  to f/u any worsening symptoms or concerns

## 2015-08-05 NOTE — Telephone Encounter (Signed)
Spoke with the pt  She states that she is has some blood coming from her left ear, cough and head congestion  She has ov with Dr Jenny Reichmann today  I advised since we have no openings to just keep ov with Dr Jenny Reichmann She verbalized understanding and nothing further needed

## 2015-08-05 NOTE — Assessment & Plan Note (Signed)
i suspect possible infecitous related TM rupture, for all tx as above, as well as ENT referral

## 2015-08-05 NOTE — Assessment & Plan Note (Signed)
Likely bronchospastic type - for depomedrol im, predpac asd, cont nebs/proair prn

## 2015-08-05 NOTE — Progress Notes (Signed)
Subjective:    Patient ID: Felicia Odom, female    DOB: 1956/05/09, 60 y.o.   MRN: [id number]  HPI  Here to f/u with acute, c/o sudden onset URi symptoms on sat jan 7 with congestion, took tylenol but thought no more about it, as she figured it was viral and just needed to pass.  Since then now with mild to mod worsening HA, general weakness and malaise, with prod cough greenish yellow sputum, but Pt denies chest pain, orthopnea, PND, increased LE swelling, palpitations, dizziness or syncope, except for onset mild to mod wheezing in the past 2 days as well , with increased sob/doe leading to much increased neb use at home, helps somewhat.  Also incidentally complicating all of this is 2-3 days onset severe left ear pain, culminating it seems with a gush of blood, large it seemed to her yesterday, with temp up to 104, chills, could not sleep last night, but no improvement on the left ear pain, also with new left hearing loss as well. Past Medical History  Diagnosis Date  . OCD (obsessive compulsive disorder)   . Panic disorder   . COPD (chronic obstructive pulmonary disease) ([place])   . Asthma    Past Surgical History  Procedure Laterality Date  . Ectopic pregnancy surgery      x 2   . Laparoscopic lysis intestinal adhesions      reports that she quit smoking about 4 years ago. Her smoking use included Cigarettes. She has a 20 pack-year smoking history. She has never used smokeless tobacco. She reports that she does not drink alcohol or use illicit drugs. family history includes Breast cancer in her sister; Clotting disorder in her mother; Diabetes in her mother; Heart attack in her father. No Known Allergies Current Outpatient Prescriptions on File Prior to Visit  Medication Sig Dispense Refill  . ADVAIR DISKUS 500-50 MCG/DOSE AEPB INHALE 1 PUFF BY MOUTH INTO THE LUNGS EVERY 12 HOURS (RINSE MOUTH AFTERWARDS) 60 each 4  . albuterol (PROVENTIL) (2.5 MG/3ML) 0.083% nebulizer solution  Take 3 mLs (2.5 mg total) by nebulization every 6 (six) hours. Dx COPD with bronchitis 75 mL 12  . ALPRAZolam (XANAX) 1 MG tablet 1 every 6 hours, only if really needed 120 tablet 3  . esomeprazole (NEXIUM) 40 MG capsule Take 1 capsule (40 mg total) by mouth daily. 30 capsule prn  . HYDROcodone-acetaminophen (NORCO) 10-325 MG tablet Take 1 tablet by mouth every 6 (six) hours as needed. 120 tablet 0  . ibuprofen (ADVIL,MOTRIN) 800 MG tablet TAKE 1 TABLET BY MOUTH TWICE A DAY WITH FOOD AS NEEDED FOR PAIN 60 tablet 2  . Nebulizers (COMPRESSOR/NEBULIZER) MISC As directed 1 each 0  . PROAIR HFA 108 (90 BASE) MCG/ACT inhaler INHALE 2 PUFFS BY MOUTH INTO THE LUNGS EVERY 4 (FOUR) HOURS AS NEEDED. 8.5 Inhaler 3  . [DISCONTINUED] estradiol (VIVELLE-DOT) 0.0375 MG/24HR Place 1 patch onto the skin 2 (two) times a week.      . [DISCONTINUED] ipratropium (ATROVENT) 0.02 % nebulizer solution Take 2.5 mLs (500 mcg total) by nebulization 4 (four) times daily. DX:  496 300 mL 5  . [DISCONTINUED] progesterone (PROMETRIUM) 100 MG capsule Take 100 mg by mouth daily.       No current facility-administered medications on file prior to visit.   Review of Systems  Constitutional: Negative for unusual diaphoresis or night sweats HENT: Negative for ringing in ear or discharge Eyes: Negative for double vision or worsening visual disturbance.  Respiratory: Negative for choking and stridor.   Gastrointestinal: Negative for vomiting or other signifcant bowel change Genitourinary: Negative for hematuria or change in urine volume.  Musculoskeletal: Negative for other MSK pain or swelling Skin: Negative for color change and worsening wound.  Neurological: Negative for tremors and numbness other than noted  Psychiatric/Behavioral: Negative for decreased concentration or agitation other than above       Objective:   Physical Exam BP 120/62 mmHg  Pulse 84  Temp(Src) 97.6 F (36.4 C) (Oral)  Ht 5\' 6"  (1.676 m)  Wt 199 lb  (90.266 kg)  BMI 32.13 kg/m2  SpO2 98% VS noted, mild to mod ill Constitutional: Pt appears in no significant distress HENT: Head: NCAT.  Right Ear: External ear normal.  Left Ear: External ear normal.  Left ext canal without wax but possible traces of blood, also 1-2+ red/swelling, tender to examine Left TM dark hue, can only see about half, cannot tell if any rupture to my exam Right TM mild erythema, canal clear without red/swelling Bilat tm's with mild erythema.  Max sinus areas mild tender.  Pharynx with mild erythema, no exudate Eyes: . Pupils are equal, round, and reactive to light. Conjunctivae and EOM are normal Neck: Normal range of motion. Neck supple.  Cardiovascular: Normal rate and regular rhythm.   Pulmonary/Chest: Effort normal and breath sounds decresaed bialt without rales but few scattered wheezes.  Neurological: Pt is alert. Not confused , motor grossly intact Skin: Skin is warm. No rash, no LE edema Psychiatric: Pt behavior is normal. No agitation.       Assessment & Plan:

## 2015-08-10 ENCOUNTER — Other Ambulatory Visit: Payer: Self-pay | Admitting: Internal Medicine

## 2015-08-10 ENCOUNTER — Telehealth: Payer: Self-pay | Admitting: Internal Medicine

## 2015-08-10 DIAGNOSIS — H6522 Chronic serous otitis media, left ear: Secondary | ICD-10-CM | POA: Diagnosis not present

## 2015-08-10 DIAGNOSIS — H6502 Acute serous otitis media, left ear: Secondary | ICD-10-CM | POA: Diagnosis not present

## 2015-08-10 DIAGNOSIS — J320 Chronic maxillary sinusitis: Secondary | ICD-10-CM | POA: Diagnosis not present

## 2015-08-10 DIAGNOSIS — H6692 Otitis media, unspecified, left ear: Secondary | ICD-10-CM | POA: Diagnosis not present

## 2015-08-10 DIAGNOSIS — J449 Chronic obstructive pulmonary disease, unspecified: Secondary | ICD-10-CM | POA: Diagnosis not present

## 2015-08-10 DIAGNOSIS — J322 Chronic ethmoidal sinusitis: Secondary | ICD-10-CM | POA: Diagnosis not present

## 2015-08-10 NOTE — Telephone Encounter (Signed)
Attempted to contact pt. No answer. Will try back. 

## 2015-08-11 NOTE — Telephone Encounter (Signed)
Patient called back, please return her call at [phone]

## 2015-08-11 NOTE — Telephone Encounter (Signed)
Ok- Please tell her I have looked at the chest xray, and will wait for the CT scan scheduled by Dr Jenny Reichmann. This is a very small spot, but we will want to follow it.

## 2015-08-11 NOTE — Telephone Encounter (Signed)
Spoke with pt, aware of recs.  Ct ordered for Thursday.  Nothing further needed .

## 2015-08-11 NOTE — Telephone Encounter (Signed)
Spoke with pt, states that Dr. Jenny Reichmann had ordered a cxr which showed she needs a follow up ct to look at a nodule.  Results in Epic.   Pt wanted to make CY aware.  Forwarding to CY as [employer]

## 2015-08-13 ENCOUNTER — Other Ambulatory Visit: Payer: Self-pay | Admitting: Internal Medicine

## 2015-08-13 ENCOUNTER — Ambulatory Visit (INDEPENDENT_AMBULATORY_CARE_PROVIDER_SITE_OTHER)
Admission: RE | Admit: 2015-08-13 | Discharge: 2015-08-13 | Disposition: A | Discharge status: Home / Self Care | Payer: Medicare Other | Source: Ambulatory Visit | Attending: Internal Medicine | Admitting: Internal Medicine

## 2015-08-13 DIAGNOSIS — R9389 Abnormal findings on diagnostic imaging of other specified body structures: Secondary | ICD-10-CM

## 2015-08-13 DIAGNOSIS — J9859 Other diseases of mediastinum, not elsewhere classified: Secondary | ICD-10-CM

## 2015-08-13 DIAGNOSIS — R911 Solitary pulmonary nodule: Secondary | ICD-10-CM | POA: Diagnosis not present

## 2015-08-17 DIAGNOSIS — H6522 Chronic serous otitis media, left ear: Secondary | ICD-10-CM | POA: Diagnosis not present

## 2015-08-17 DIAGNOSIS — H6062 Unspecified chronic otitis externa, left ear: Secondary | ICD-10-CM | POA: Diagnosis not present

## 2015-08-27 ENCOUNTER — Telehealth: Payer: Self-pay | Admitting: Internal Medicine

## 2015-08-27 MED ORDER — AMOXICILLIN-POT CLAVULANATE 875-125 MG PO TABS: 1.0000 | ORAL_TABLET | Freq: Two times a day (BID) | ORAL | Status: DC

## 2015-08-27 NOTE — Telephone Encounter (Signed)
Pt informed

## 2015-08-27 NOTE — Telephone Encounter (Signed)
done

## 2015-08-27 NOTE — Telephone Encounter (Signed)
Pt was seen a few weeks ago with Dr. Jenny Reichmann about her ears and he sent her to an ear dr and she seen him a couple times. She now has a bad sore throat and coughing up yellow mucus.  She states she is unable to get here and was wanting an antibiotic sent to [facility] on [address] right away. I explained to her he would need to see her but she can't get here and was crying and wanted him to please hurry in sending in the prescription.

## 2015-09-05 DIAGNOSIS — J449 Chronic obstructive pulmonary disease, unspecified: Secondary | ICD-10-CM | POA: Diagnosis not present

## 2015-09-08 DIAGNOSIS — Z113 Encounter for screening for infections with a predominantly sexual mode of transmission: Secondary | ICD-10-CM | POA: Diagnosis not present

## 2015-09-08 DIAGNOSIS — A630 Anogenital (venereal) warts: Secondary | ICD-10-CM | POA: Diagnosis not present

## 2015-09-08 DIAGNOSIS — Z1151 Encounter for screening for human papillomavirus (HPV): Secondary | ICD-10-CM | POA: Diagnosis not present

## 2015-09-08 DIAGNOSIS — Z01419 Encounter for gynecological examination (general) (routine) without abnormal findings: Secondary | ICD-10-CM | POA: Diagnosis not present

## 2015-09-08 DIAGNOSIS — Z114 Encounter for screening for human immunodeficiency virus [HIV]: Secondary | ICD-10-CM | POA: Diagnosis not present

## 2015-09-08 DIAGNOSIS — Z1159 Encounter for screening for other viral diseases: Secondary | ICD-10-CM | POA: Diagnosis not present

## 2015-09-09 DIAGNOSIS — J449 Chronic obstructive pulmonary disease, unspecified: Secondary | ICD-10-CM | POA: Diagnosis not present

## 2015-09-14 ENCOUNTER — Telehealth: Payer: Self-pay | Admitting: Internal Medicine

## 2015-09-14 MED ORDER — AZITHROMYCIN 250 MG PO TABS: ORAL_TABLET | ORAL | Status: DC

## 2015-09-14 MED ORDER — ALPRAZOLAM 1 MG PO TABS: ORAL_TABLET | ORAL | Status: DC

## 2015-09-14 MED ORDER — HYDROCODONE-HOMATROPINE 5-1.5 MG/5ML PO SYRP: 5.0000 mL | ORAL_SOLUTION | Freq: Four times a day (QID) | ORAL | Status: DC | PRN

## 2015-09-14 NOTE — Telephone Encounter (Signed)
Called and spoke with pt. Reviewed CY's recs and verified pharmacy as [facility] [address]. She states she will pick up the promethazine cough syrup and xanax refills tomorrow at the front of the office and requested the zpak be sent to the pharmacy. Rx have been printed, signed, and placed at the front for pick up. Zpak was sent to pharmacy. Nothing further needed.

## 2015-09-14 NOTE — Telephone Encounter (Signed)
Last ov with CY on 01/29/16 Next ov with CY on 10/21/15  Called and spoke with pt. Pt c/o sinus congestion, sore throat, and prod cough with green colored mucus since 2/15. Denies any fever, nausea or vomiting. Pt is requesting a refill on her xanax and promethazine-codiene cough syprup stating that she does not have enough supply to wait until her visit with CY on 10/21/15. I explained I would need to send CY a message before we would be able to refill meds. She voiced understanding and had no further questions.   CY please advise if okay to refill  Xanax 1mg  Take 1 tablet by mouth every 6 hours as needed  #120 with 3 refills Last refilled on 06/15/15 by CY  Promethazine- codeine cough syrup Take 85mls every 4 hours as needed for cough #247mls with 0 refills Last refilled on 08/11/15 by Dr. Rex Kras  No Known Allergies    Current outpatient prescriptions:  .  ADVAIR DISKUS 500-50 MCG/DOSE AEPB, INHALE 1 PUFF BY MOUTH INTO THE LUNGS EVERY 12 HOURS (RINSE MOUTH AFTERWARDS), Disp: 60 each, Rfl: 4 .  albuterol (PROVENTIL) (2.5 MG/3ML) 0.083% nebulizer solution, Take 3 mLs (2.5 mg total) by nebulization every 6 (six) hours. Dx COPD with bronchitis, Disp: 75 mL, Rfl: 12 .  ALPRAZolam (XANAX) 1 MG tablet, 1 every 6 hours, only if really needed, Disp: 120 tablet, Rfl: 3 .  amoxicillin-clavulanate (AUGMENTIN) 875-125 MG tablet, Take 1 tablet by mouth 2 (two) times daily., Disp: 20 tablet, Rfl: 0 .  esomeprazole (NEXIUM) 40 MG capsule, TAKE 1 CAPSULE (40 MG TOTAL) BY MOUTH DAILY., Disp: 30 capsule, Rfl: 6 .  HYDROcodone-acetaminophen (NORCO) 10-325 MG tablet, Take 1 tablet by mouth every 6 (six) hours as needed., Disp: 120 tablet, Rfl: 0 .  HYDROcodone-homatropine (HYCODAN) 5-1.5 MG/5ML syrup, Take 5 mLs by mouth every 6 (six) hours as needed for cough., Disp: 180 mL, Rfl: 0 .  ibuprofen (ADVIL,MOTRIN) 800 MG tablet, TAKE 1 TABLET BY MOUTH TWICE A DAY WITH FOOD AS NEEDED FOR PAIN, Disp: 60 tablet, Rfl:  2 .  Nebulizers (COMPRESSOR/NEBULIZER) MISC, As directed, Disp: 1 each, Rfl: 0 .  predniSONE (DELTASONE) 10 MG tablet, 3 tabs by mouth per day for 3 days,2tabs per day for 3 days,1tab per day for 3 days, Disp: 18 tablet, Rfl: 0 .  PROAIR HFA 108 (90 BASE) MCG/ACT inhaler, INHALE 2 PUFFS BY MOUTH INTO THE LUNGS EVERY 4 (FOUR) HOURS AS NEEDED., Disp: 8.5 Inhaler, Rfl: 3 .  promethazine-codeine (PHENERGAN WITH CODEINE) 6.25-10 MG/5ML syrup, Take 5 mLs by mouth every 4 (four) hours as needed for cough., Disp: 240 mL, Rfl: 0 .  [DISCONTINUED] estradiol (VIVELLE-DOT) 0.0375 MG/24HR, Place 1 patch onto the skin 2 (two) times a week.  , Disp: , Rfl:  .  [DISCONTINUED] ipratropium (ATROVENT) 0.02 % nebulizer solution, Take 2.5 mLs (500 mcg total) by nebulization 4 (four) times daily. DX:  496, Disp: 300 mL, Rfl: 5 .  [DISCONTINUED] progesterone (PROMETRIUM) 100 MG capsule, Take 100 mg by mouth daily.  , Disp: , Rfl:

## 2015-09-14 NOTE — Telephone Encounter (Signed)
Ok to refill both  Offer Z pak if she feels it would help the bronchitis

## 2015-09-23 ENCOUNTER — Encounter: Payer: Self-pay | Admitting: Internal Medicine

## 2015-09-23 ENCOUNTER — Ambulatory Visit (INDEPENDENT_AMBULATORY_CARE_PROVIDER_SITE_OTHER): Payer: Medicare Other | Admitting: Internal Medicine

## 2015-09-23 VITALS — BP 146/92 | HR 67 | Temp 98.0°F | Resp 16 | Ht 66.0 in | Wt 199.0 lb

## 2015-09-23 DIAGNOSIS — M159 Polyosteoarthritis, unspecified: Secondary | ICD-10-CM

## 2015-09-23 DIAGNOSIS — I10 Essential (primary) hypertension: Secondary | ICD-10-CM

## 2015-09-23 DIAGNOSIS — Z23 Encounter for immunization: Secondary | ICD-10-CM

## 2015-09-23 DIAGNOSIS — M5030 Other cervical disc degeneration, unspecified cervical region: Secondary | ICD-10-CM | POA: Diagnosis not present

## 2015-09-23 DIAGNOSIS — M150 Primary generalized (osteo)arthritis: Secondary | ICD-10-CM

## 2015-09-23 MED ORDER — HYDROCODONE-ACETAMINOPHEN 10-325 MG PO TABS: 1.0000 | ORAL_TABLET | Freq: Four times a day (QID) | ORAL | Status: DC | PRN

## 2015-09-23 MED ORDER — IBUPROFEN 800 MG PO TABS: ORAL_TABLET | ORAL | Status: DC

## 2015-09-23 NOTE — Progress Notes (Signed)
Pre visit review using our clinic review tool, if applicable. No additional management support is needed unless otherwise documented below in the visit note. 

## 2015-09-23 NOTE — Progress Notes (Signed)
Subjective:  Patient ID: Felicia Odom, female    DOB: Sep 04, 1955  Age: 60 y.o. MRN: [id number]  CC: Hypertension and Osteoarthritis    HPI [name] presents for a blood pressure check as well as follow-up on anxiety and degenerative joint disease. She was recently seen for an upper respiratory infection and tells me that all those symptoms have resolved. She complains of chronic bilateral knee pain and gets good symptom relief with Norco. She also has a generalized sensation of anxiety and is doing well on the occasional dose of Xanax. She offers no new complaints today.  Outpatient Prescriptions Prior to Visit  Medication Sig Dispense Refill  . ADVAIR DISKUS 500-50 MCG/DOSE AEPB INHALE 1 PUFF BY MOUTH INTO THE LUNGS EVERY 12 HOURS (RINSE MOUTH AFTERWARDS) 60 each 4  . albuterol (PROVENTIL) (2.5 MG/3ML) 0.083% nebulizer solution Take 3 mLs (2.5 mg total) by nebulization every 6 (six) hours. Dx COPD with bronchitis 75 mL 12  . ALPRAZolam (XANAX) 1 MG tablet 1 every 6 hours, only if really needed 120 tablet 0  . esomeprazole (NEXIUM) 40 MG capsule TAKE 1 CAPSULE (40 MG TOTAL) BY MOUTH DAILY. 30 capsule 6  . Nebulizers (COMPRESSOR/NEBULIZER) MISC As directed 1 each 0  . PROAIR HFA 108 (90 BASE) MCG/ACT inhaler INHALE 2 PUFFS BY MOUTH INTO THE LUNGS EVERY 4 (FOUR) HOURS AS NEEDED. 8.5 Inhaler 3  . HYDROcodone-acetaminophen (NORCO) 10-325 MG tablet Take 1 tablet by mouth every 6 (six) hours as needed. 120 tablet 0  . ibuprofen (ADVIL,MOTRIN) 800 MG tablet TAKE 1 TABLET BY MOUTH TWICE A DAY WITH FOOD AS NEEDED FOR PAIN 60 tablet 2  . amoxicillin-clavulanate (AUGMENTIN) 875-125 MG tablet Take 1 tablet by mouth 2 (two) times daily. 20 tablet 0  . azithromycin (ZITHROMAX) 250 MG tablet Take 2 tablets by mouth today, then take 1 tablet daily until gone 6 tablet 0  . HYDROcodone-homatropine (HYCODAN) 5-1.5 MG/5ML syrup Take 5 mLs by mouth every 6 (six) hours as needed for cough. 180  mL 0  . predniSONE (DELTASONE) 10 MG tablet 3 tabs by mouth per day for 3 days,2tabs per day for 3 days,1tab per day for 3 days 18 tablet 0  . promethazine-codeine (PHENERGAN WITH CODEINE) 6.25-10 MG/5ML syrup Take 5 mLs by mouth every 4 (four) hours as needed for cough. 240 mL 0   No facility-administered medications prior to visit.    ROS Review of Systems  Constitutional: Negative.  Negative for fever, chills, diaphoresis, appetite change and fatigue.  HENT: Negative.   Eyes: Negative.   Respiratory: Negative.  Negative for cough, choking, chest tightness, shortness of breath and stridor.   Cardiovascular: Negative.  Negative for chest pain, palpitations and leg swelling.  Gastrointestinal: Negative.  Negative for nausea, vomiting, abdominal pain, diarrhea, constipation and blood in stool.  Endocrine: Negative.   Genitourinary: Negative for dysuria, urgency, hematuria and difficulty urinating.  Musculoskeletal: Positive for arthralgias. Negative for myalgias and back pain.  Skin: Negative.   Allergic/Immunologic: Negative.   Neurological: Negative.  Negative for dizziness, tremors, weakness, light-headedness, numbness and headaches.  Hematological: Negative.  Negative for adenopathy. Does not bruise/bleed easily.  Psychiatric/Behavioral: Positive for sleep disturbance. Negative for suicidal ideas, self-injury, dysphoric mood, decreased concentration and agitation. The patient is nervous/anxious.     Objective:  BP 146/92 mmHg  Pulse 67  Temp(Src) 98 F (36.7 C) (Oral)  Resp 16  Ht 5\' 6"  (1.676 m)  Wt 199 lb (90.266 kg)  BMI 32.13  kg/m2  SpO2 96%  BP Readings from Last 3 Encounters:  09/23/15 146/92  08/05/15 120/62  06/15/15 138/82    Wt Readings from Last 3 Encounters:  09/23/15 199 lb (90.266 kg)  08/05/15 199 lb (90.266 kg)  06/15/15 205 lb (92.987 kg)    Physical Exam  Constitutional: She is oriented to person, place, and time. No distress.  HENT:  Head:  Normocephalic and atraumatic.  Mouth/Throat: Oropharynx is clear and moist. No oropharyngeal exudate.  Eyes: Conjunctivae are normal. Right eye exhibits no discharge. Left eye exhibits no discharge. No scleral icterus.  Neck: Normal range of motion. Neck supple. No JVD present. No tracheal deviation present. No thyromegaly present.  Cardiovascular: Normal rate, regular rhythm, normal heart sounds and intact distal pulses.  Exam reveals no gallop and no friction rub.   No murmur heard. Pulmonary/Chest: Effort normal and breath sounds normal. No stridor. No respiratory distress. She has no wheezes. She has no rales. She exhibits no tenderness.  Abdominal: Soft. Bowel sounds are normal. She exhibits no distension and no mass. There is no tenderness. There is no rebound and no guarding.  Musculoskeletal: Normal range of motion. She exhibits no edema or tenderness.  Lymphadenopathy:    She has no cervical adenopathy.  Neurological: She is oriented to person, place, and time.  Skin: Skin is warm and dry. No rash noted. She is not diaphoretic. No erythema. No pallor.  Vitals reviewed.   Lab Results  Component Value Date   WBC 9.1 01/21/2015   HGB 15.3* 01/21/2015   HCT 45.4 01/21/2015   PLT 312.0 01/21/2015   GLUCOSE 94 01/21/2015   CHOL 225* 01/21/2015   TRIG 236.0* 01/21/2015   HDL 46.90 01/21/2015   LDLDIRECT 142.0 01/21/2015   ALT 18 01/21/2015   AST 17 01/21/2015   NA 138 01/21/2015   K 4.0 01/21/2015   CL 101 01/21/2015   CREATININE 0.84 01/21/2015   BUN 16 01/21/2015   CO2 29 01/21/2015   TSH 1.73 01/21/2015   HGBA1C 5.7 01/21/2015    Ct Chest Wo Contrast  08/13/2015  CLINICAL DATA:  Pulmonary nodule.  Chronic cough and congestion EXAM: CT CHEST WITHOUT CONTRAST TECHNIQUE: Multidetector CT imaging of the chest was performed following the standard protocol without IV contrast. COMPARISON:  Chest radiograph July 26, 2015. FINDINGS: Mediastinum/Lymph Nodes: Thyroid appears  unremarkable. There is a mildly enlarged focal apparent lymph node in the anterior mediastinum measuring 2.0 x 1.6 x 1.6 cm. No other adenopathy is apparent. A few subcentimeter axillary lymph nodes do not meet size criteria for pathologic significance. There are subcentimeter paratracheal lymph nodes which also do not meet size criteria for pathologic significance. There is no thoracic aortic aneurysm. The visualized great vessels appear unremarkable. Pericardium is not thickened. There is a small amount of coronary artery calcification. Lungs/Pleura: There is slight bibasilar atelectatic change. No lung edema or consolidation is apparent. No parenchymal lung nodule is seen radiographically to correlate with the apparent nodular lesion in the posterior thoracic regions seen on on recent lateral view but not confirmed on the frontal view. There is no pleural effusion or pleural thickening appreciable. Upper abdomen: Visualized upper abdominal structures appear unremarkable. Musculoskeletal: A small bone island in the anterior right third rib is apparent. No other sclerotic foci identified. There are no lytic or destructive bone lesions. There is slight degenerative change in the thoracic spine. IMPRESSION: There is a focal single prominent lymph node in the anterior mediastinum. The significance of this  finding as an isolated finding is uncertain. A follow-up CT in 8-10 weeks to assess for stability of this finding may well be warranted. If there is strong concern for neoplastic etiology, nuclear medicine PET study to assess degree of metabolic activity of this lymph node may be reasonable. No evidence of adenopathy by size criteria is noted elsewhere in the chest. No parenchymal lung nodular lesions are identified. In particular, CT is not confirmed a posterior thoracic nodular lesion as was suggested on recent chest radiograph. The etiology for the apparent nodular opacity on the chest radiograph is uncertain. The  lungs are currently clear except for slight bibasilar atelectasis. Small bone island anterior right third rib. No other bone lesions are evident on this study. There is slight degenerative change in the thoracic spine. These results will be called to the ordering clinician or representative by the Radiologist Assistant, and communication documented in the PACS or zVision Dashboard. Electronically Signed   By: Lowella Grip III M.D.   On: 08/13/2015 10:35    Assessment & Plan:   Alveena was seen today for hypertension and osteoarthritis.  Diagnoses and all orders for this visit:  Primary osteoarthritis involving multiple joints -     ibuprofen (ADVIL,MOTRIN) 800 MG tablet; TAKE 1 TABLET BY MOUTH TWICE A DAY WITH FOOD AS NEEDED FOR PAIN -     Discontinue: HYDROcodone-acetaminophen (NORCO) 10-325 MG tablet; Take 1 tablet by mouth every 6 (six) hours as needed. -     Discontinue: HYDROcodone-acetaminophen (NORCO) 10-325 MG tablet; Take 1 tablet by mouth every 6 (six) hours as needed. -     Discontinue: HYDROcodone-acetaminophen (NORCO) 10-325 MG tablet; Take 1 tablet by mouth every 6 (six) hours as needed. -     HYDROcodone-acetaminophen (NORCO) 10-325 MG tablet; Take 1 tablet by mouth every 6 (six) hours as needed.  DDD (degenerative disc disease), cervical -     ibuprofen (ADVIL,MOTRIN) 800 MG tablet; TAKE 1 TABLET BY MOUTH TWICE A DAY WITH FOOD AS NEEDED FOR PAIN -     Discontinue: HYDROcodone-acetaminophen (NORCO) 10-325 MG tablet; Take 1 tablet by mouth every 6 (six) hours as needed. -     Discontinue: HYDROcodone-acetaminophen (NORCO) 10-325 MG tablet; Take 1 tablet by mouth every 6 (six) hours as needed. -     Discontinue: HYDROcodone-acetaminophen (NORCO) 10-325 MG tablet; Take 1 tablet by mouth every 6 (six) hours as needed. -     HYDROcodone-acetaminophen (NORCO) 10-325 MG tablet; Take 1 tablet by mouth every 6 (six) hours as needed.  Need for Tdap vaccination -     Tdap vaccine  greater than or equal to 7yo IM  Essential hypertension- she has a borderline elevation in her blood pressure, this may be related to her take of anti-inflammatories but she needs those for chronic pain, at this time she is not willing to take an antihypertensive, I will recheck her blood pressure in about 4 months and will consider starting medication if indicated. In the meantime she will work on her lifestyle modifications.  Other orders -     Cancel: ibuprofen (ADVIL,MOTRIN) 800 MG tablet; TAKE 1 TABLET BY MOUTH TWICE A DAY WITH FOOD AS NEEDED FOR PAIN -     Cancel: HYDROcodone-acetaminophen (NORCO) 10-325 MG tablet; Take 1 tablet by mouth every 6 (six) hours as needed.   I have discontinued Ms. Braid's predniSONE, promethazine-codeine, amoxicillin-clavulanate, HYDROcodone-homatropine, and azithromycin. I am also having her maintain her Compressor/Nebulizer, albuterol, PROAIR HFA, ADVAIR DISKUS, esomeprazole, ALPRAZolam, triamcinolone cream, ibuprofen,  and HYDROcodone-acetaminophen.  Meds ordered this encounter  Medications  . triamcinolone cream (KENALOG) 0.1 %    Sig: APPLY 2 TIMES DAILY FOR 10 DAYS    Refill:  0  . ibuprofen (ADVIL,MOTRIN) 800 MG tablet    Sig: TAKE 1 TABLET BY MOUTH TWICE A DAY WITH FOOD AS NEEDED FOR PAIN    Dispense:  60 tablet    Refill:  5  . DISCONTD: HYDROcodone-acetaminophen (NORCO) 10-325 MG tablet    Sig: Take 1 tablet by mouth every 6 (six) hours as needed.    Dispense:  120 tablet    Refill:  0    Fill on or after 09/23/15  . DISCONTD: HYDROcodone-acetaminophen (NORCO) 10-325 MG tablet    Sig: Take 1 tablet by mouth every 6 (six) hours as needed.    Dispense:  120 tablet    Refill:  0    Fill on or after 10/24/15  . DISCONTD: HYDROcodone-acetaminophen (NORCO) 10-325 MG tablet    Sig: Take 1 tablet by mouth every 6 (six) hours as needed.    Dispense:  120 tablet    Refill:  0    Fill on or after 11/23/15  . HYDROcodone-acetaminophen (NORCO)  10-325 MG tablet    Sig: Take 1 tablet by mouth every 6 (six) hours as needed.    Dispense:  120 tablet    Refill:  0    Fill on or after 12/24/15     Follow-up: Return in about 4 months (around 01/23/2016).  Scarlette Calico, MD

## 2015-09-23 NOTE — Patient Instructions (Signed)
Hypertension Hypertension, commonly called high blood pressure, is when the force of blood pumping through your arteries is too strong. Your arteries are the blood vessels that carry blood from your heart throughout your body. A blood pressure reading consists of a higher number over a lower number, such as 110/72. The higher number (systolic) is the pressure inside your arteries when your heart pumps. The lower number (diastolic) is the pressure inside your arteries when your heart relaxes. Ideally you want your blood pressure below 120/80. Hypertension forces your heart to work harder to pump blood. Your arteries may become narrow or stiff. Having untreated or uncontrolled hypertension can cause heart attack, stroke, kidney disease, and other problems. RISK FACTORS Some risk factors for high blood pressure are controllable. Others are not.  Risk factors you cannot control include:   Race. You may be at higher risk if you are African American.  Age. Risk increases with age.  Gender. Men are at higher risk than women before age 45 years. After age 65, women are at higher risk than men. Risk factors you can control include:  Not getting enough exercise or physical activity.  Being overweight.  Getting too much fat, sugar, calories, or salt in your diet.  Drinking too much alcohol. SIGNS AND SYMPTOMS Hypertension does not usually cause signs or symptoms. Extremely high blood pressure (hypertensive crisis) may cause headache, anxiety, shortness of breath, and nosebleed. DIAGNOSIS To check if you have hypertension, your health care provider will measure your blood pressure while you are seated, with your arm held at the level of your heart. It should be measured at least twice using the same arm. Certain conditions can cause a difference in blood pressure between your right and left arms. A blood pressure reading that is higher than normal on one occasion does not mean that you need treatment. If  it is not clear whether you have high blood pressure, you may be asked to return on a different day to have your blood pressure checked again. Or, you may be asked to monitor your blood pressure at home for 1 or more weeks. TREATMENT Treating high blood pressure includes making lifestyle changes and possibly taking medicine. Living a healthy lifestyle can help lower high blood pressure. You may need to change some of your habits. Lifestyle changes may include:  Following the DASH diet. This diet is high in fruits, vegetables, and whole grains. It is low in salt, red meat, and added sugars.  Keep your sodium intake below 2,300 mg per day.  Getting at least 30-45 minutes of aerobic exercise at least 4 times per week.  Losing weight if necessary.  Not smoking.  Limiting alcoholic beverages.  Learning ways to reduce stress. Your health care provider may prescribe medicine if lifestyle changes are not enough to get your blood pressure under control, and if one of the following is true:  You are 18-59 years of age and your systolic blood pressure is above 140.  You are 60 years of age or older, and your systolic blood pressure is above 150.  Your diastolic blood pressure is above 90.  You have diabetes, and your systolic blood pressure is over 140 or your diastolic blood pressure is over 90.  You have kidney disease and your blood pressure is above 140/90.  You have heart disease and your blood pressure is above 140/90. Your personal target blood pressure may vary depending on your medical conditions, your age, and other factors. HOME CARE INSTRUCTIONS    Have your blood pressure rechecked as directed by your health care provider.   Take medicines only as directed by your health care provider. Follow the directions carefully. Blood pressure medicines must be taken as prescribed. The medicine does not work as well when you skip doses. Skipping doses also puts you at risk for  problems.  Do not smoke.   Monitor your blood pressure at home as directed by your health care provider. SEEK MEDICAL CARE IF:   You think you are having a reaction to medicines taken.  You have recurrent headaches or feel dizzy.  You have swelling in your ankles.  You have trouble with your vision. SEEK IMMEDIATE MEDICAL CARE IF:  You develop a severe headache or confusion.  You have unusual weakness, numbness, or feel faint.  You have severe chest or abdominal pain.  You vomit repeatedly.  You have trouble breathing. MAKE SURE YOU:   Understand these instructions.  Will watch your condition.  Will get help right away if you are not doing well or get worse.   This information is not intended to replace advice given to you by your health care provider. Make sure you discuss any questions you have with your health care provider.   Document Released: 07/11/2005 Document Revised: 11/25/2014 Document Reviewed: 05/03/2013 Elsevier Interactive Patient Education 2016 [employer].  

## 2015-09-25 DIAGNOSIS — I10 Essential (primary) hypertension: Secondary | ICD-10-CM | POA: Insufficient documentation

## 2015-09-25 HISTORY — DX: Essential (primary) hypertension: I10

## 2015-10-02 DIAGNOSIS — J449 Chronic obstructive pulmonary disease, unspecified: Secondary | ICD-10-CM | POA: Diagnosis not present

## 2015-10-03 DIAGNOSIS — J449 Chronic obstructive pulmonary disease, unspecified: Secondary | ICD-10-CM | POA: Diagnosis not present

## 2015-10-05 DIAGNOSIS — Z118 Encounter for screening for other infectious and parasitic diseases: Secondary | ICD-10-CM | POA: Diagnosis not present

## 2015-10-05 DIAGNOSIS — N870 Mild cervical dysplasia: Secondary | ICD-10-CM | POA: Diagnosis not present

## 2015-10-05 DIAGNOSIS — R87810 Cervical high risk human papillomavirus (HPV) DNA test positive: Secondary | ICD-10-CM | POA: Diagnosis not present

## 2015-10-05 DIAGNOSIS — Z113 Encounter for screening for infections with a predominantly sexual mode of transmission: Secondary | ICD-10-CM | POA: Diagnosis not present

## 2015-10-12 ENCOUNTER — Ambulatory Visit (INDEPENDENT_AMBULATORY_CARE_PROVIDER_SITE_OTHER)
Admission: RE | Admit: 2015-10-12 | Discharge: 2015-10-12 | Disposition: A | Discharge status: Home / Self Care | Payer: Medicare Other | Source: Ambulatory Visit | Attending: Internal Medicine | Admitting: Internal Medicine

## 2015-10-12 ENCOUNTER — Telehealth: Payer: Self-pay

## 2015-10-12 DIAGNOSIS — R05 Cough: Secondary | ICD-10-CM | POA: Diagnosis not present

## 2015-10-12 DIAGNOSIS — R938 Abnormal findings on diagnostic imaging of other specified body structures: Secondary | ICD-10-CM | POA: Diagnosis not present

## 2015-10-12 DIAGNOSIS — J9859 Other diseases of mediastinum, not elsewhere classified: Secondary | ICD-10-CM

## 2015-10-12 DIAGNOSIS — R9389 Abnormal findings on diagnostic imaging of other specified body structures: Secondary | ICD-10-CM

## 2015-10-12 NOTE — Telephone Encounter (Signed)
Please make sure to see imaging results (resulting today) for this patient-----see Felicia Odom if any questions

## 2015-10-13 ENCOUNTER — Telehealth: Payer: Self-pay | Admitting: Internal Medicine

## 2015-10-13 NOTE — Telephone Encounter (Signed)
i walked a note back to dr Jenny Reichmann and asked if he could check his messages for these results and advise patient

## 2015-10-13 NOTE — Telephone Encounter (Signed)
Spoke to pt, see phone note

## 2015-10-13 NOTE — Telephone Encounter (Signed)
Pt called request the result for this test. Please call her today if possible.

## 2015-10-13 NOTE — Telephone Encounter (Signed)
Spoke to pt by phone,   She wishes to f/u with Dr Annamaria Boots mar 29 as already has appt

## 2015-10-14 ENCOUNTER — Telehealth: Payer: Self-pay | Admitting: Internal Medicine

## 2015-10-14 ENCOUNTER — Ambulatory Visit: Payer: Medicare Other | Admitting: Internal Medicine

## 2015-10-14 MED ORDER — PROMETHAZINE-CODEINE 6.25-10 MG/5ML PO SYRP: 5.0000 mL | ORAL_SOLUTION | ORAL | Status: DC | PRN

## 2015-10-14 NOTE — Telephone Encounter (Signed)
Spoke with the pt and notified of recs per CDY  She verbalized understanding  Rx was sent to pharm   Pt to keep pending ov with CDY 10/21/15

## 2015-10-14 NOTE — Telephone Encounter (Signed)
Ok to refill this time, but ask her to make it last a month and needs an ROV with me or NP next couple of months

## 2015-10-14 NOTE — Telephone Encounter (Signed)
Pt calling for refill on cough meds.  Pt c/o increased cough not able to suppress with OTC Delsym, cough drops and Robitussin. Requesting a refill of Prometh-Codeine cough syrup.  Last filled 09/23/15, #274mL - Take 10mL every 4 hours prn Please advise Dr Annamaria Boots if okay to refill. Thanks.   No Known Allergies   Medication List       This list is accurate as of: 10/14/15 10:50 AM.  Always use your most recent med list.               ADVAIR DISKUS 500-50 MCG/DOSE Aepb  Generic drug:  Fluticasone-Salmeterol  INHALE 1 PUFF BY MOUTH INTO THE LUNGS EVERY 12 HOURS (RINSE MOUTH AFTERWARDS)     albuterol (2.5 MG/3ML) 0.083% nebulizer solution  Commonly known as:  PROVENTIL  Take 3 mLs (2.5 mg total) by nebulization every 6 (six) hours. Dx COPD with bronchitis     PROAIR HFA 108 (90 Base) MCG/ACT inhaler  Generic drug:  albuterol  INHALE 2 PUFFS BY MOUTH INTO THE LUNGS EVERY 4 (FOUR) HOURS AS NEEDED.     ALPRAZolam 1 MG tablet  Commonly known as:  XANAX  1 every 6 hours, only if really needed     Compressor/Nebulizer Misc  As directed     esomeprazole 40 MG capsule  Commonly known as:  NEXIUM  TAKE 1 CAPSULE (40 MG TOTAL) BY MOUTH DAILY.     HYDROcodone-acetaminophen 10-325 MG tablet  Commonly known as:  NORCO  Take 1 tablet by mouth every 6 (six) hours as needed.     ibuprofen 800 MG tablet  Commonly known as:  ADVIL,MOTRIN  TAKE 1 TABLET BY MOUTH TWICE A DAY WITH FOOD AS NEEDED FOR PAIN     triamcinolone cream 0.1 %  Commonly known as:  KENALOG  APPLY 2 TIMES DAILY FOR 10 DAYS

## 2015-10-17 ENCOUNTER — Other Ambulatory Visit: Payer: Self-pay | Admitting: Internal Medicine

## 2015-10-21 ENCOUNTER — Telehealth: Payer: Self-pay | Admitting: Internal Medicine

## 2015-10-21 ENCOUNTER — Ambulatory Visit (INDEPENDENT_AMBULATORY_CARE_PROVIDER_SITE_OTHER): Payer: Medicare Other | Admitting: Internal Medicine

## 2015-10-21 ENCOUNTER — Encounter: Payer: Self-pay | Admitting: Internal Medicine

## 2015-10-21 VITALS — BP 116/72 | HR 64 | Ht 66.5 in | Wt 199.0 lb

## 2015-10-21 DIAGNOSIS — F410 Panic disorder [episodic paroxysmal anxiety] without agoraphobia: Secondary | ICD-10-CM

## 2015-10-21 DIAGNOSIS — J4530 Mild persistent asthma, uncomplicated: Secondary | ICD-10-CM

## 2015-10-21 DIAGNOSIS — J9859 Other diseases of mediastinum, not elsewhere classified: Secondary | ICD-10-CM

## 2015-10-21 DIAGNOSIS — D150 Benign neoplasm of thymus: Secondary | ICD-10-CM

## 2015-10-21 DIAGNOSIS — D4989 Neoplasm of unspecified behavior of other specified sites: Secondary | ICD-10-CM | POA: Insufficient documentation

## 2015-10-21 MED ORDER — ALPRAZOLAM 1 MG PO TABS: ORAL_TABLET | ORAL | Status: DC

## 2015-10-21 MED ORDER — NONFORMULARY OR COMPOUNDED ITEM: Status: DC
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
## 2015-10-21 NOTE — Telephone Encounter (Signed)
Called spoke with pt. Aware of below. Nothing further needed 

## 2015-10-21 NOTE — Telephone Encounter (Signed)
Spoke with pt. States that the compounded cough syrup that CY wanted her to have will take a few days to make. She wants promethazine-codeine cough syrup sent in. Advised her that this medication is on back order. Pt would like CY's recommendations on what to do in the mean time.  No Known Allergies Current Outpatient Prescriptions on File Prior to Visit  Medication Sig Dispense Refill  . ADVAIR DISKUS 500-50 MCG/DOSE AEPB INHALE 1 PUFF BY MOUTH INTO THE LUNGS EVERY 12 HOURS (RINSE MOUTH AFTERWARDS) 60 each 4  . albuterol (PROVENTIL) (2.5 MG/3ML) 0.083% nebulizer solution Take 3 mLs (2.5 mg total) by nebulization every 6 (six) hours. Dx COPD with bronchitis 75 mL 12  . ALPRAZolam (XANAX) 1 MG tablet 1 every 6 hours, only if really needed 120 tablet 0  . esomeprazole (NEXIUM) 40 MG capsule TAKE 1 CAPSULE (40 MG TOTAL) BY MOUTH DAILY. 30 capsule 6  . HYDROcodone-acetaminophen (NORCO) 10-325 MG tablet Take 1 tablet by mouth every 6 (six) hours as needed. 120 tablet 0  . ibuprofen (ADVIL,MOTRIN) 800 MG tablet TAKE 1 TABLET BY MOUTH TWICE A DAY WITH FOOD AS NEEDED FOR PAIN 60 tablet 5  . Nebulizers (COMPRESSOR/NEBULIZER) MISC As directed 1 each 0  . NONFORMULARY OR COMPOUNDED ITEM Terpin Hydrate with Codeine  1 tsp every 6 hours if needed for cough  # 4oz 4 each 0  . PROAIR HFA 108 (90 Base) MCG/ACT inhaler INHALE 2 PUFFS BY MOUTH INTO THE LUNGS EVERY 4 HOURS AS NEEDED 8.5 Inhaler 2  . promethazine-codeine (PHENERGAN WITH CODEINE) 6.25-10 MG/5ML syrup Take 5 mLs by mouth every 4 (four) hours as needed for cough. 240 mL 0  . triamcinolone cream (KENALOG) 0.1 % APPLY 2 TIMES DAILY FOR 10 DAYS  0  . [DISCONTINUED] estradiol (VIVELLE-DOT) 0.0375 MG/24HR Place 1 patch onto the skin 2 (two) times a week.      . [DISCONTINUED] ipratropium (ATROVENT) 0.02 % nebulizer solution Take 2.5 mLs (500 mcg total) by nebulization 4 (four) times daily. DX:  496 300 mL 5  . [DISCONTINUED] progesterone (PROMETRIUM)  100 MG capsule Take 100 mg by mouth daily.       No current facility-administered medications on file prior to visit.    CY - please advise. Thanks.

## 2015-10-21 NOTE — Patient Instructions (Addendum)
Refill script for alprazolam  Script handwritten for terpin hydrate with codeine cough syrup to be gotten from [facility] on  [place] spirometry    Dx mediastinal mass  Order Schedule PET scan neck to thigh    Dx mediastinal mass  Order- referral to Thoracic surgery    After the PET scan    Dx mediastinal mass

## 2015-10-21 NOTE — Assessment & Plan Note (Signed)
No wheezing noted on exam today. Best fits pattern of low-grade chronic bronchitis probably with a post viral exacerbation from this winter. Discussed best management. Plan-Terpin hydrate with codeine cough syrup ( compounded by [facility])

## 2015-10-21 NOTE — Telephone Encounter (Signed)
Best bet would be otc Delsym cough syrup

## 2015-10-21 NOTE — Assessment & Plan Note (Signed)
Images reviewed with her. Past smoking history but position makes this less likely to be a bronchogenic cancer. Not likely related to her bronchitic cough Plan-office spirometry done today. Schedule PET scan then follow with thoracic surgery referral

## 2015-10-21 NOTE — Assessment & Plan Note (Signed)
Refilled alprazolam with discussion

## 2015-10-23 DIAGNOSIS — Z713 Dietary counseling and surveillance: Secondary | ICD-10-CM | POA: Diagnosis not present

## 2015-10-23 DIAGNOSIS — M949 Disorder of cartilage, unspecified: Secondary | ICD-10-CM | POA: Diagnosis not present

## 2015-10-23 DIAGNOSIS — R5381 Other malaise: Secondary | ICD-10-CM | POA: Diagnosis not present

## 2015-10-23 DIAGNOSIS — N959 Unspecified menopausal and perimenopausal disorder: Secondary | ICD-10-CM | POA: Diagnosis not present

## 2015-10-23 DIAGNOSIS — Z6832 Body mass index (BMI) 32.0-32.9, adult: Secondary | ICD-10-CM | POA: Diagnosis not present

## 2015-10-27 DIAGNOSIS — J449 Chronic obstructive pulmonary disease, unspecified: Secondary | ICD-10-CM | POA: Diagnosis not present

## 2015-11-03 ENCOUNTER — Ambulatory Visit (HOSPITAL_COMMUNITY)
Admission: RE | Admit: 2015-11-03 | Discharge: 2015-11-03 | Disposition: A | Discharge status: Home / Self Care | Payer: Medicare Other | Source: Ambulatory Visit | Attending: Internal Medicine | Admitting: Internal Medicine

## 2015-11-03 DIAGNOSIS — R222 Localized swelling, mass and lump, trunk: Secondary | ICD-10-CM | POA: Diagnosis not present

## 2015-11-03 DIAGNOSIS — J9859 Other diseases of mediastinum, not elsewhere classified: Secondary | ICD-10-CM | POA: Diagnosis not present

## 2015-11-03 DIAGNOSIS — E041 Nontoxic single thyroid nodule: Secondary | ICD-10-CM | POA: Insufficient documentation

## 2015-11-03 DIAGNOSIS — E329 Disease of thymus, unspecified: Secondary | ICD-10-CM | POA: Insufficient documentation

## 2015-11-03 LAB — GLUCOSE, CAPILLARY: GLUCOSE-CAPILLARY: 124 mg/dL — AB (ref 65–99)

## 2015-11-03 MED ORDER — FLUDEOXYGLUCOSE F - 18 (FDG) INJECTION
10.1100 | Freq: Once | INTRAVENOUS | Status: AC | PRN
  Administered 2015-11-03: 10.11 via INTRAVENOUS

## 2015-11-04 ENCOUNTER — Telehealth: Payer: Self-pay | Admitting: Internal Medicine

## 2015-11-04 DIAGNOSIS — E041 Nontoxic single thyroid nodule: Secondary | ICD-10-CM

## 2015-11-04 NOTE — Telephone Encounter (Signed)
Discussed with patient based on PET scan.  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 We are also scheduling ultrasound of thyroid for nodule seen on PET.

## 2015-11-04 NOTE — Telephone Encounter (Signed)
Order placed for Ultrasound of thyroid per CY for Dx Thyroid nodule

## 2015-11-05 ENCOUNTER — Institutional Professional Consult (permissible substitution) (INDEPENDENT_AMBULATORY_CARE_PROVIDER_SITE_OTHER): Payer: Medicare Other | Admitting: Cardiothoracic Surgery

## 2015-11-05 ENCOUNTER — Encounter: Payer: Self-pay | Admitting: Cardiothoracic Surgery

## 2015-11-05 VITALS — BP 111/74 | HR 80 | Resp 20 | Ht 66.0 in | Wt 199.0 lb

## 2015-11-05 DIAGNOSIS — R918 Other nonspecific abnormal finding of lung field: Secondary | ICD-10-CM | POA: Diagnosis not present
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
## 2015-11-12 ENCOUNTER — Other Ambulatory Visit: Payer: Self-pay | Admitting: Internal Medicine

## 2015-11-12 ENCOUNTER — Telehealth: Payer: Self-pay | Admitting: Internal Medicine

## 2015-11-12 ENCOUNTER — Ambulatory Visit (HOSPITAL_COMMUNITY)
Admission: RE | Admit: 2015-11-12 | Discharge: 2015-11-12 | Disposition: A | Discharge status: Home / Self Care | Payer: Medicare Other | Source: Ambulatory Visit | Attending: Internal Medicine | Admitting: Internal Medicine

## 2015-11-12 DIAGNOSIS — E041 Nontoxic single thyroid nodule: Secondary | ICD-10-CM

## 2015-11-12 DIAGNOSIS — E010 Iodine-deficiency related diffuse (endemic) goiter: Secondary | ICD-10-CM | POA: Diagnosis not present

## 2015-11-12 MED ORDER — PROMETHAZINE-CODEINE 6.25-10 MG/5ML PO SYRP: 5.0000 mL | ORAL_SOLUTION | ORAL | Status: DC | PRN

## 2015-11-12 NOTE — Telephone Encounter (Signed)
Spoke with pt.  She had thyroid ultrasound done this morning and wants to know what next step is.  Also, she states that the Terpin Hydrate cough med did not help cough and is requesting Promethazine with Codeine.  I called her pharmacy and they have it available now.  Please advise if ok to refill.  No Known Allergies  Current Outpatient Prescriptions on File Prior to Visit  Medication Sig Dispense Refill  . ADVAIR DISKUS 500-50 MCG/DOSE AEPB INHALE 1 PUFF BY MOUTH INTO THE LUNGS EVERY 12 HOURS (RINSE MOUTH AFTERWARDS) 60 each 4  . albuterol (PROVENTIL) (2.5 MG/3ML) 0.083% nebulizer solution Take 3 mLs (2.5 mg total) by nebulization every 6 (six) hours. Dx COPD with bronchitis 75 mL 12  . ALPRAZolam (XANAX) 1 MG tablet 1 every 6 hours, only if really needed 120 tablet 0  . esomeprazole (NEXIUM) 40 MG capsule TAKE 1 CAPSULE (40 MG TOTAL) BY MOUTH DAILY. 30 capsule 6  . HYDROcodone-acetaminophen (NORCO) 10-325 MG tablet Take 1 tablet by mouth every 6 (six) hours as needed. 120 tablet 0  . ibuprofen (ADVIL,MOTRIN) 800 MG tablet TAKE 1 TABLET BY MOUTH TWICE A DAY WITH FOOD AS NEEDED FOR PAIN 60 tablet 5  . Nebulizers (COMPRESSOR/NEBULIZER) MISC As directed 1 each 0  . NONFORMULARY OR COMPOUNDED ITEM Terpin Hydrate with Codeine  1 tsp every 6 hours if needed for cough  # 4oz 4 each 0  . PROAIR HFA 108 (90 Base) MCG/ACT inhaler INHALE 2 PUFFS BY MOUTH INTO THE LUNGS EVERY 4 HOURS AS NEEDED 8.5 Inhaler 2  . promethazine-codeine (PHENERGAN WITH CODEINE) 6.25-10 MG/5ML syrup Take 5 mLs by mouth every 4 (four) hours as needed for cough. 240 mL 0  . triamcinolone cream (KENALOG) 0.1 % APPLY 2 TIMES DAILY FOR 10 DAYS  0  . [DISCONTINUED] estradiol (VIVELLE-DOT) 0.0375 MG/24HR Place 1 patch onto the skin 2 (two) times a week.      . [DISCONTINUED] ipratropium (ATROVENT) 0.02 % nebulizer solution Take 2.5 mLs (500 mcg total) by nebulization 4 (four) times daily. DX:  496 300 mL 5  . [DISCONTINUED]  progesterone (PROMETRIUM) 100 MG capsule Take 100 mg by mouth daily.       No current facility-administered medications on file prior to visit.

## 2015-11-12 NOTE — Telephone Encounter (Signed)
Spoke with pt. She is aware of the below information. Rx will be called in. Referral has been placed. Nothing further was needed.

## 2015-11-12 NOTE — Telephone Encounter (Signed)
Ok to refill prometh codeine  Her thyroid ultrasound test indicates this nodule should be biopsied:  Order- interventional Radiology-   Ultrasound guided fine needle aspiration biopsy of thyroid nodule   Dx thyroid nosdule

## 2015-11-13 ENCOUNTER — Other Ambulatory Visit: Payer: Self-pay | Admitting: Internal Medicine

## 2015-11-13 DIAGNOSIS — E041 Nontoxic single thyroid nodule: Secondary | ICD-10-CM

## 2015-11-13 NOTE — Addendum Note (Signed)
Addended by: Len Blalock on: 11/13/2015 10:37 AM   Modules accepted: Orders

## 2015-11-16 ENCOUNTER — Telehealth: Payer: Self-pay | Admitting: Internal Medicine

## 2015-11-16 MED ORDER — ALPRAZOLAM 1 MG PO TABS: ORAL_TABLET | ORAL | Status: DC

## 2015-11-16 NOTE — Telephone Encounter (Signed)
Pt is calling for refill of Xanax, states that she was told to call about a week ahead of time so she doesn't run out.   CY please advise if ok to refill. Thank you!  No Known Allergies Current Outpatient Prescriptions on File Prior to Visit  Medication Sig Dispense Refill  . ADVAIR DISKUS 500-50 MCG/DOSE AEPB INHALE 1 PUFF BY MOUTH INTO THE LUNGS EVERY 12 HOURS (RINSE MOUTH AFTERWARDS) 60 each 4  . albuterol (PROVENTIL) (2.5 MG/3ML) 0.083% nebulizer solution Take 3 mLs (2.5 mg total) by nebulization every 6 (six) hours. Dx COPD with bronchitis 75 mL 12  . ALPRAZolam (XANAX) 1 MG tablet 1 every 6 hours, only if really needed 120 tablet 0  . esomeprazole (NEXIUM) 40 MG capsule TAKE 1 CAPSULE (40 MG TOTAL) BY MOUTH DAILY. 30 capsule 6  . HYDROcodone-acetaminophen (NORCO) 10-325 MG tablet Take 1 tablet by mouth every 6 (six) hours as needed. 120 tablet 0  . ibuprofen (ADVIL,MOTRIN) 800 MG tablet TAKE 1 TABLET BY MOUTH TWICE A DAY WITH FOOD AS NEEDED FOR PAIN 60 tablet 5  . Nebulizers (COMPRESSOR/NEBULIZER) MISC As directed 1 each 0  . NONFORMULARY OR COMPOUNDED ITEM Terpin Hydrate with Codeine  1 tsp every 6 hours if needed for cough  # 4oz 4 each 0  . PROAIR HFA 108 (90 Base) MCG/ACT inhaler INHALE 2 PUFFS BY MOUTH INTO THE LUNGS EVERY 4 HOURS AS NEEDED 8.5 Inhaler 2  . promethazine-codeine (PHENERGAN WITH CODEINE) 6.25-10 MG/5ML syrup Take 5 mLs by mouth every 4 (four) hours as needed for cough. 240 mL 0  . triamcinolone cream (KENALOG) 0.1 % APPLY 2 TIMES DAILY FOR 10 DAYS  0  . [DISCONTINUED] estradiol (VIVELLE-DOT) 0.0375 MG/24HR Place 1 patch onto the skin 2 (two) times a week.      . [DISCONTINUED] ipratropium (ATROVENT) 0.02 % nebulizer solution Take 2.5 mLs (500 mcg total) by nebulization 4 (four) times daily. DX:  496 300 mL 5  . [DISCONTINUED] progesterone (PROMETRIUM) 100 MG capsule Take 100 mg by mouth daily.       No current facility-administered medications on file prior to  visit.

## 2015-11-16 NOTE — Telephone Encounter (Signed)
Called spoke with patient and informed her that CY okayed the refill on Alprazolam  Pharmacy verified Spoke with pharmacist Crystal to give the prescription refills to Nothing further needed; will sign off

## 2015-11-16 NOTE — Telephone Encounter (Signed)
Ok to refill x 6 months 

## 2015-11-18 ENCOUNTER — Ambulatory Visit
Admission: RE | Admit: 2015-11-18 | Discharge: 2015-11-18 | Disposition: A | Discharge status: Home / Self Care | Payer: Medicare Other | Source: Ambulatory Visit | Attending: Internal Medicine | Admitting: Internal Medicine

## 2015-11-18 ENCOUNTER — Other Ambulatory Visit (HOSPITAL_COMMUNITY)
Admission: RE | Admit: 2015-11-18 | Discharge: 2015-11-18 | Disposition: A | Discharge status: Home / Self Care | Payer: Medicare Other | Source: Ambulatory Visit | Attending: Physician Assistant | Admitting: Physician Assistant

## 2015-11-18 DIAGNOSIS — E041 Nontoxic single thyroid nodule: Secondary | ICD-10-CM | POA: Insufficient documentation

## 2015-11-19 ENCOUNTER — Telehealth: Payer: Self-pay | Admitting: Internal Medicine

## 2015-11-19 MED ORDER — PROMETHAZINE-CODEINE 6.25-10 MG/5ML PO SYRP: 5.0000 mL | ORAL_SOLUTION | ORAL | Status: DC | PRN

## 2015-11-19 MED ORDER — AMOXICILLIN 500 MG PO TABS: 500.0000 mg | ORAL_TABLET | Freq: Three times a day (TID) | ORAL | Status: DC

## 2015-11-19 NOTE — Telephone Encounter (Signed)
Pt is calling for refill of her Prometh-Codeine cough syrup - last filled 11/12/15 #240 with instructions to take every 4 hours prn. Pt states that she is not out and has about 1-2 days left but is wanting to make sure it gets filled before the weekend so that she has some if needed.  Pt also states that she is coughing a lot and is getting up [place] mucus.  Please advise if okay to refill. Thanks.   Patient also wanting to know if you have heard anything about her Thyroid Biopsy.    Please advise Dr Annamaria Boots. Thanks.     Medication List       This list is accurate as of: 11/19/15  9:14 AM.  Always use your most recent med list.               ADVAIR DISKUS 500-50 MCG/DOSE Aepb  Generic drug:  Fluticasone-Salmeterol  INHALE 1 PUFF BY MOUTH INTO THE LUNGS EVERY 12 HOURS (RINSE MOUTH AFTERWARDS)     albuterol (2.5 MG/3ML) 0.083% nebulizer solution  Commonly known as:  PROVENTIL  Take 3 mLs (2.5 mg total) by nebulization every 6 (six) hours. Dx COPD with bronchitis     PROAIR HFA 108 (90 Base) MCG/ACT inhaler  Generic drug:  albuterol  INHALE 2 PUFFS BY MOUTH INTO THE LUNGS EVERY 4 HOURS AS NEEDED     ALPRAZolam 1 MG tablet  Commonly known as:  XANAX  1 every 6 hours, only if really needed     Compressor/Nebulizer Misc  As directed     esomeprazole 40 MG capsule  Commonly known as:  NEXIUM  TAKE 1 CAPSULE (40 MG TOTAL) BY MOUTH DAILY.     HYDROcodone-acetaminophen 10-325 MG tablet  Commonly known as:  NORCO  Take 1 tablet by mouth every 6 (six) hours as needed.     ibuprofen 800 MG tablet  Commonly known as:  ADVIL,MOTRIN  TAKE 1 TABLET BY MOUTH TWICE A DAY WITH FOOD AS NEEDED FOR PAIN     NONFORMULARY OR COMPOUNDED ITEM  Terpin Hydrate with Codeine  1 tsp every 6 hours if needed for cough  # 4oz     promethazine-codeine 6.25-10 MG/5ML syrup  Commonly known as:  PHENERGAN with CODEINE  Take 5 mLs by mouth every 4 (four) hours as needed for cough.     triamcinolone cream  0.1 %  Commonly known as:  KENALOG  APPLY 2 TIMES DAILY FOR 10 DAYS

## 2015-11-19 NOTE — Telephone Encounter (Signed)
Ok to refill cough syrup this time. She needs to make it last longer this time  Offer amoxacillin 500 mg, # 21,  1 three times daily

## 2015-11-19 NOTE — Telephone Encounter (Signed)
Notified patient that needle bx thyroid benign nodule. She will follow up on this with Dr Ronnald Ramp as he directs. She is going to call Dr Everrett Coombe office to schedule f/u of her mediastinal mass/ possible thymoma.

## 2015-11-19 NOTE — Telephone Encounter (Signed)
Spoke with pt and advised of Dr Janee Morn recommendations.  Pt verbalized understanding.  Rx sent,  Nothing further needed

## 2015-11-26 DIAGNOSIS — J449 Chronic obstructive pulmonary disease, unspecified: Secondary | ICD-10-CM | POA: Diagnosis not present

## 2015-12-22 DIAGNOSIS — J449 Chronic obstructive pulmonary disease, unspecified: Secondary | ICD-10-CM | POA: Diagnosis not present

## 2015-12-30 ENCOUNTER — Telehealth: Payer: Self-pay

## 2015-12-30 DIAGNOSIS — L219 Seborrheic dermatitis, unspecified: Secondary | ICD-10-CM

## 2015-12-30 NOTE — Telephone Encounter (Signed)
Patient needs a referral to [phone]) [facility] to see Dr.Walter Elvera Lennox. Please follow up.

## 2015-12-31 NOTE — Telephone Encounter (Signed)
Referral ordered

## 2015-12-31 NOTE — Telephone Encounter (Signed)
Spoke with pt and she states she is having and recurrent rash on arms and scalp that she has seen Dr. Elvera Lennox before.  A prior referral was put in last July for seborrheic dermatitis of scalp. Can this referral be re-entered due to exp for same Dr.? I offered a visit in office pt declined.

## 2016-01-06 DIAGNOSIS — D1801 Hemangioma of skin and subcutaneous tissue: Secondary | ICD-10-CM | POA: Diagnosis not present

## 2016-01-06 DIAGNOSIS — L309 Dermatitis, unspecified: Secondary | ICD-10-CM | POA: Diagnosis not present

## 2016-01-06 DIAGNOSIS — D225 Melanocytic nevi of trunk: Secondary | ICD-10-CM | POA: Diagnosis not present

## 2016-01-06 DIAGNOSIS — L570 Actinic keratosis: Secondary | ICD-10-CM | POA: Diagnosis not present

## 2016-01-18 DIAGNOSIS — J449 Chronic obstructive pulmonary disease, unspecified: Secondary | ICD-10-CM | POA: Diagnosis not present

## 2016-01-21 ENCOUNTER — Ambulatory Visit (INDEPENDENT_AMBULATORY_CARE_PROVIDER_SITE_OTHER): Payer: Medicare Other | Admitting: Internal Medicine

## 2016-01-21 ENCOUNTER — Encounter: Payer: Self-pay | Admitting: Internal Medicine

## 2016-01-21 VITALS — BP 120/84 | HR 67 | Temp 98.4°F | Resp 16 | Ht 66.0 in | Wt 196.0 lb

## 2016-01-21 DIAGNOSIS — K2100 Gastro-esophageal reflux disease with esophagitis, without bleeding: Secondary | ICD-10-CM

## 2016-01-21 DIAGNOSIS — M5030 Other cervical disc degeneration, unspecified cervical region: Secondary | ICD-10-CM | POA: Diagnosis not present

## 2016-01-21 DIAGNOSIS — R1314 Dysphagia, pharyngoesophageal phase: Secondary | ICD-10-CM | POA: Insufficient documentation

## 2016-01-21 DIAGNOSIS — M159 Polyosteoarthritis, unspecified: Secondary | ICD-10-CM

## 2016-01-21 DIAGNOSIS — D150 Benign neoplasm of thymus: Secondary | ICD-10-CM

## 2016-01-21 DIAGNOSIS — L218 Other seborrheic dermatitis: Secondary | ICD-10-CM

## 2016-01-21 DIAGNOSIS — Z1231 Encounter for screening mammogram for malignant neoplasm of breast: Secondary | ICD-10-CM | POA: Diagnosis not present

## 2016-01-21 DIAGNOSIS — F410 Panic disorder [episodic paroxysmal anxiety] without agoraphobia: Secondary | ICD-10-CM

## 2016-01-21 DIAGNOSIS — I10 Essential (primary) hypertension: Secondary | ICD-10-CM

## 2016-01-21 DIAGNOSIS — F429 Obsessive-compulsive disorder, unspecified: Secondary | ICD-10-CM

## 2016-01-21 DIAGNOSIS — M150 Primary generalized (osteo)arthritis: Secondary | ICD-10-CM

## 2016-01-21 DIAGNOSIS — D4989 Neoplasm of unspecified behavior of other specified sites: Secondary | ICD-10-CM

## 2016-01-21 DIAGNOSIS — L219 Seborrheic dermatitis, unspecified: Secondary | ICD-10-CM

## 2016-01-21 MED ORDER — ALPRAZOLAM 1 MG PO TABS: ORAL_TABLET | ORAL | Status: DC

## 2016-01-21 MED ORDER — HYDROCODONE-ACETAMINOPHEN 10-325 MG PO TABS: 1.0000 | ORAL_TABLET | Freq: Four times a day (QID) | ORAL | Status: DC | PRN

## 2016-01-21 MED ORDER — ESOMEPRAZOLE MAGNESIUM 40 MG PO CPDR: DELAYED_RELEASE_CAPSULE | ORAL | Status: DC

## 2016-01-21 MED ORDER — TRIAMCINOLONE ACETONIDE 0.1 % EX CREA: TOPICAL_CREAM | CUTANEOUS | Status: DC

## 2016-01-21 NOTE — Progress Notes (Signed)
Pre visit review using our clinic review tool, if applicable. No additional management support is needed unless otherwise documented below in the visit note. 

## 2016-01-21 NOTE — Patient Instructions (Signed)

## 2016-01-21 NOTE — Progress Notes (Signed)
Subjective:  Patient ID: Felicia Odom, female    DOB: Aug 18, 1955  Age: 60 y.o. MRN: [id number]  CC: Gastroesophageal Reflux   HPI Felicia Odom presents for follow-up and concerns about gastroesophageal reflux disease. She states over the last year she has had gradually worsening episodes of feeling like food gets caught in the upper part of her esophagus near her throat and having choking episodes. She has not experienced odynophagia or hematemesis. She was recently found to have thyroid nodules that underwent biopsy and apparently were benign. 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 She now tells me she is ready to see that cardiothoracic surgeon again to see if the thymoma needs to be excised.  Outpatient Prescriptions Prior to Visit  Medication Sig Dispense Refill  . ADVAIR DISKUS 500-50 MCG/DOSE AEPB INHALE 1 PUFF BY MOUTH INTO THE LUNGS EVERY 12 HOURS (RINSE MOUTH AFTERWARDS) 60 each 4  . albuterol (PROVENTIL) (2.5 MG/3ML) 0.083% nebulizer solution Take 3 mLs (2.5 mg total) by nebulization every 6 (six) hours. Dx COPD with bronchitis 75 mL 12  . ibuprofen (ADVIL,MOTRIN) 800 MG tablet TAKE 1 TABLET BY MOUTH TWICE A DAY WITH FOOD AS NEEDED FOR PAIN 60 tablet 5  . Nebulizers (COMPRESSOR/NEBULIZER) MISC As directed 1 each 0  . PROAIR HFA 108 (90 Base) MCG/ACT inhaler INHALE 2 PUFFS BY MOUTH INTO THE LUNGS EVERY 4 HOURS AS NEEDED 8.5 Inhaler 2  . ALPRAZolam (XANAX) 1 MG tablet 1 every 6 hours, only if really needed 120 tablet 5  . esomeprazole (NEXIUM) 40 MG capsule TAKE 1 CAPSULE (40 MG TOTAL) BY MOUTH DAILY. 30 capsule 6  . HYDROcodone-acetaminophen (NORCO) 10-325 MG tablet Take 1 tablet by mouth every 6 (six) hours as needed. 120 tablet 0  . NONFORMULARY OR COMPOUNDED ITEM Terpin Hydrate with Codeine  1 tsp every 6 hours if needed for cough  #  4oz 4 each 0  . promethazine-codeine (PHENERGAN WITH CODEINE) 6.25-10 MG/5ML syrup Take 5 mLs by mouth every 4 (four) hours as needed for cough. 240 mL 0  . triamcinolone cream (KENALOG) 0.1 % APPLY 2 TIMES DAILY FOR 10 DAYS  0  . amoxicillin (AMOXIL) 500 MG tablet Take 1 tablet (500 mg total) by mouth 3 (three) times daily. (Patient not taking: Reported on 01/21/2016) 21 tablet 0   No facility-administered medications prior to visit.    ROS Review of Systems  Constitutional: Negative.  Negative for fatigue.  HENT: Positive for trouble swallowing. Negative for facial swelling, sore throat and voice change.   Eyes: Negative.   Respiratory: Positive for choking. Negative for cough, chest tightness, shortness of breath and stridor.   Cardiovascular: Negative.  Negative for chest pain, palpitations and leg swelling.  Gastrointestinal: Negative.  Negative for nausea, vomiting, abdominal pain, diarrhea and constipation.  Endocrine: Negative.   Genitourinary: Negative.   Musculoskeletal: Positive for arthralgias and neck pain. Negative for myalgias, back pain, joint swelling, gait problem and neck stiffness.  Skin: Negative.  Negative for color change and rash.  Allergic/Immunologic: Negative.   Neurological: Negative.  Negative for dizziness, tremors, weakness, light-headedness and numbness.  Hematological: Negative.  Negative for adenopathy. Does not bruise/bleed easily.  Psychiatric/Behavioral: Negative for suicidal ideas, confusion, sleep disturbance, self-injury, dysphoric mood, decreased concentration and agitation. The patient is nervous/anxious.        She continues to  complain of episodes of anxiety and panic, she feels overwhelmed with all of the health concerns she has had over the last few months.    Objective:  BP 120/84 mmHg  Pulse 67  Temp(Src) 98.4 F (36.9 C) (Oral)  Resp 16  Ht 5\' 6"  (1.676 m)  Wt 196 lb (88.905 kg)  BMI 31.65 kg/m2  SpO2 95%  BP Readings from Last 3  Encounters:  01/21/16 120/84  11/05/15 111/74  10/21/15 116/72    Wt Readings from Last 3 Encounters:  01/21/16 196 lb (88.905 kg)  11/05/15 199 lb (90.266 kg)  10/21/15 199 lb (90.266 kg)    Physical Exam  Constitutional: She is oriented to person, place, and time. No distress.  HENT:  Mouth/Throat: Oropharynx is clear and moist. No oropharyngeal exudate.  Eyes: Conjunctivae are normal. Right eye exhibits no discharge. Left eye exhibits no discharge. No scleral icterus.  Neck: Normal range of motion. Neck supple. No JVD present. No tracheal deviation present. No thyromegaly present.  Cardiovascular: Normal rate, regular rhythm, normal heart sounds and intact distal pulses.  Exam reveals no gallop and no friction rub.   No murmur heard. Pulmonary/Chest: Effort normal and breath sounds normal. No stridor. No respiratory distress. She has no wheezes. She has no rales. She exhibits no tenderness.  Abdominal: Soft. Bowel sounds are normal. She exhibits no distension and no mass. There is no tenderness. There is no rebound and no guarding.  Musculoskeletal: Normal range of motion. She exhibits no edema or tenderness.  Lymphadenopathy:    She has no cervical adenopathy.  Neurological: She is oriented to person, place, and time.  Skin: Skin is warm and dry. No rash noted. She is not diaphoretic. No erythema. No pallor.  Psychiatric: Her behavior is normal. Thought content normal. Her mood appears anxious. Her affect is not angry, not blunt, not labile and not inappropriate. Her speech is not rapid and/or pressured, not delayed and not tangential. She is not slowed and not withdrawn. Cognition and memory are normal. She does not exhibit a depressed mood. She is attentive.  Vitals reviewed.   Lab Results  Component Value Date   WBC 9.1 01/21/2015   HGB 15.3* 01/21/2015   HCT 45.4 01/21/2015   PLT 312.0 01/21/2015   GLUCOSE 94 01/21/2015   CHOL 225* 01/21/2015   TRIG 236.0* 01/21/2015    HDL 46.90 01/21/2015   LDLDIRECT 142.0 01/21/2015   ALT 18 01/21/2015   AST 17 01/21/2015   NA 138 01/21/2015   K 4.0 01/21/2015   CL 101 01/21/2015   CREATININE 0.84 01/21/2015   BUN 16 01/21/2015   CO2 29 01/21/2015   TSH 1.73 01/21/2015   HGBA1C 5.7 01/21/2015    US Thyroid Biopsy  11/18/2015  INDICATION: A 2.5 cm right thyroid nodule which was initially seen and was hypermetabolic on PET scan. Request for ultrasound guided fine needle aspirate biopsy. EXAM: ULTRASOUND GUIDED NEEDLE ASPIRATE BIOPSY OF THE THYROID GLAND MEDICATIONS: 1% Lidocaine. ANESTHESIA/SEDATION: No sedation medication given. COMPARISON:  PET scan done 11/03/2015 and Ultrasound done 11/12/2015. COMPLICATIONS: None immediate. PROCEDURE: Thyroid biopsy was thoroughly discussed with the patient and questions were answered. The benefits, risks, alternatives, and complications were also discussed. The patient understands and wishes to proceed with the procedure. Written consent was obtained. Ultrasound was performed to localize and mark an adequate site for the biopsy. The patient was then prepped and draped in a normal sterile fashion. Local anesthesia was provided with 1% lidocaine. Using direct  ultrasound guidance, 4 passes were made using 25 gauge needles into the nodule within the right lobe of the thyroid. Ultrasound was used to confirm needle placements on all occasions. Specimens were sent to Pathology for analysis. IMPRESSION: Ultrasound guided needle aspirate biopsy performed of the right thyroid nodule. Read by:  Gareth Eagle, PA-C Electronically Signed   By: Jerilynn Mages.  Shick M.D.   On: 11/18/2015 11:26    Assessment & Plan:   Diandrea was seen today for gastroesophageal reflux.  Diagnoses and all orders for this visit:  Primary osteoarthritis involving multiple joints -     Discontinue: HYDROcodone-acetaminophen (NORCO) 10-325 MG tablet; Take 1 tablet by mouth every 6 (six) hours as needed. -     Discontinue:  HYDROcodone-acetaminophen (NORCO) 10-325 MG tablet; Take 1 tablet by mouth every 6 (six) hours as needed. -     Discontinue: HYDROcodone-acetaminophen (NORCO) 10-325 MG tablet; Take 1 tablet by mouth every 6 (six) hours as needed. -     HYDROcodone-acetaminophen (NORCO) 10-325 MG tablet; Take 1 tablet by mouth every 6 (six) hours as needed.  DDD (degenerative disc disease), cervical -     Discontinue: HYDROcodone-acetaminophen (NORCO) 10-325 MG tablet; Take 1 tablet by mouth every 6 (six) hours as needed. -     Discontinue: HYDROcodone-acetaminophen (NORCO) 10-325 MG tablet; Take 1 tablet by mouth every 6 (six) hours as needed. -     Discontinue: HYDROcodone-acetaminophen (NORCO) 10-325 MG tablet; Take 1 tablet by mouth every 6 (six) hours as needed. -     HYDROcodone-acetaminophen (NORCO) 10-325 MG tablet; Take 1 tablet by mouth every 6 (six) hours as needed.  Thymoma -     Ambulatory referral to Cardiothoracic Surgery  Gastroesophageal reflux disease with esophagitis- she will continue taking a proton pump inhibitor and have asked her to see GI to see if an upper endoscopy is needed to screen for Barrett's esophagus, strictures, ulcers, malignancy, diverticulum, etc. -     esomeprazole (NEXIUM) 40 MG capsule; TAKE 1 CAPSULE (40 MG TOTAL) BY MOUTH DAILY. -     Ambulatory referral to Gastroenterology  Essential hypertension- her blood pressure is adequately well-controlled  Seborrheic dermatitis of scalp -     triamcinolone cream (KENALOG) 0.1 %; APPLY 2 TIMES DAILY FOR 10 DAYS  Dysphagia, pharyngoesophageal phase- GI referral to consider upper endoscopy -     Ambulatory referral to Gastroenterology  PANIC DISORDER -     ALPRAZolam (XANAX) 1 MG tablet; 1 every 6 hours, only if really needed  Obsessive-compulsive disorder -     ALPRAZolam (XANAX) 1 MG tablet; 1 every 6 hours, only if really needed   I have discontinued Ms. Staples's NONFORMULARY OR COMPOUNDED ITEM,  promethazine-codeine, and amoxicillin. I am also having her maintain her Compressor/Nebulizer, albuterol, ADVAIR DISKUS, ibuprofen, PROAIR HFA, esomeprazole, triamcinolone cream, ALPRAZolam, and HYDROcodone-acetaminophen.  Meds ordered this encounter  Medications  . DISCONTD: HYDROcodone-acetaminophen (NORCO) 10-325 MG tablet    Sig: Take 1 tablet by mouth every 6 (six) hours as needed.    Dispense:  120 tablet    Refill:  0    Fill on or after 01/23/16  . esomeprazole (NEXIUM) 40 MG capsule    Sig: TAKE 1 CAPSULE (40 MG TOTAL) BY MOUTH DAILY.    Dispense:  30 capsule    Refill:  6  . triamcinolone cream (KENALOG) 0.1 %    Sig: APPLY 2 TIMES DAILY FOR 10 DAYS    Dispense:  30 g    Refill:  1  .  DISCONTD: HYDROcodone-acetaminophen (NORCO) 10-325 MG tablet    Sig: Take 1 tablet by mouth every 6 (six) hours as needed.    Dispense:  120 tablet    Refill:  0    Fill on or after 02/23/16  . ALPRAZolam (XANAX) 1 MG tablet    Sig: 1 every 6 hours, only if really needed    Dispense:  120 tablet    Refill:  5  . DISCONTD: HYDROcodone-acetaminophen (NORCO) 10-325 MG tablet    Sig: Take 1 tablet by mouth every 6 (six) hours as needed.    Dispense:  120 tablet    Refill:  0    Fill on or after 03/25/16  . HYDROcodone-acetaminophen (NORCO) 10-325 MG tablet    Sig: Take 1 tablet by mouth every 6 (six) hours as needed.    Dispense:  120 tablet    Refill:  0    Fill on or after 04/24/16     Follow-up: Return in about 3 months (around 04/22/2016).  Scarlette Calico, MD

## 2016-02-08 ENCOUNTER — Encounter: Payer: Self-pay | Admitting: Internal Medicine

## 2016-02-12 DIAGNOSIS — J449 Chronic obstructive pulmonary disease, unspecified: Secondary | ICD-10-CM | POA: Diagnosis not present

## 2016-02-13 ENCOUNTER — Other Ambulatory Visit: Payer: Self-pay | Admitting: Internal Medicine

## 2016-02-19 ENCOUNTER — Ambulatory Visit (INDEPENDENT_AMBULATORY_CARE_PROVIDER_SITE_OTHER): Payer: Medicare Other | Admitting: Internal Medicine

## 2016-02-19 ENCOUNTER — Encounter: Payer: Self-pay | Admitting: Internal Medicine

## 2016-02-19 VITALS — BP 110/70 | HR 76 | Ht 66.0 in | Wt 192.0 lb

## 2016-02-19 DIAGNOSIS — E049 Nontoxic goiter, unspecified: Secondary | ICD-10-CM | POA: Diagnosis not present

## 2016-02-19 DIAGNOSIS — R1310 Dysphagia, unspecified: Secondary | ICD-10-CM

## 2016-02-19 DIAGNOSIS — J9859 Other diseases of mediastinum, not elsewhere classified: Secondary | ICD-10-CM | POA: Diagnosis not present

## 2016-02-19 DIAGNOSIS — E010 Iodine-deficiency related diffuse (endemic) goiter: Secondary | ICD-10-CM

## 2016-02-19 NOTE — Patient Instructions (Addendum)
  You have been scheduled for a Barium Esophogram at [facility] (1st floor of the hospital) on 02/29/16 at 10:30AM. Please arrive 15 minutes prior to your appointment for registration. Make certain not to have anything to eat or drink 6 hours prior to your test. If you need to reschedule for any reason, please contact radiology at [phone] to do so. __________________________________________________________________ A barium swallow is an examination that concentrates on views of the esophagus. This tends to be a double contrast exam (barium and two liquids which, when combined, create a gas to distend the wall of the oesophagus) or single contrast (non-ionic iodine based). The study is usually tailored to your symptoms so a good history is essential. Attention is paid during the study to the form, structure and configuration of the esophagus, looking for functional disorders (such as aspiration, dysphagia, achalasia, motility and reflux) EXAMINATION You may be asked to change into a gown, depending on the type of swallow being performed. A radiologist and radiographer will perform the procedure. The radiologist will advise you of the type of contrast selected for your procedure and direct you during the exam. You will be asked to stand, sit or lie in several different positions and to hold a small amount of fluid in your mouth before being asked to swallow while the imaging is performed .In some instances you may be asked to swallow barium coated marshmallows to assess the motility of a solid food bolus. The exam can be recorded as a digital or video fluoroscopy procedure. POST PROCEDURE It will take 1-2 days for the barium to pass through your system. To facilitate this, it is important, unless otherwise directed, to increase your fluids for the next 24-48hrs and to resume your normal diet.  This test typically takes about 30 minutes to  perform. __________________________________________________________________________________ I appreciate the opportunity to care for you. Silvano Rusk, MD, [facility]

## 2016-02-19 NOTE — Progress Notes (Signed)
Referred by: [name], MD [address], [place] [address]  Subjective:    Patient ID: Felicia Odom, female    DOB: 03/28/1956, 60 y.o.   MRN: [id number] Cc: dysphagia HPI Very nice 60 yo ww with 6+ months of difficulty swallowing solids and liquids and even saliva. Has had recent FNA of hot on PET nodule - benign. Thyromegaly on [place]. Also has an anterior mediastinal mass that is being followed by Dr. Servando Snare - and there are plans for resection at some point - it is cold on PET. GI ROS neg for heartbun. Did have left ear pain and saw Dr. Ernesto Rutherford - had cerumen impaction and then also dx otitis media and externa - Tx..  No Known Allergies Outpatient Medications Prior to Visit  Medication Sig Dispense Refill  . ADVAIR DISKUS 500-50 MCG/DOSE AEPB INHALE 1 PUFF BY MOUTH INTO THE LUNGS EVERY 12 HOURS (RINSE MOUTH AFTERWARDS) 60 each 4  . albuterol (PROVENTIL) (2.5 MG/3ML) 0.083% nebulizer solution Take 3 mLs (2.5 mg total) by nebulization every 6 (six) hours. Dx COPD with bronchitis 75 mL 12  . ALPRAZolam (XANAX) 1 MG tablet 1 every 6 hours, only if really needed 120 tablet 5  . esomeprazole (NEXIUM) 40 MG capsule TAKE 1 CAPSULE (40 MG TOTAL) BY MOUTH DAILY. 30 capsule 6  . HYDROcodone-acetaminophen (NORCO) 10-325 MG tablet Take 1 tablet by mouth every 6 (six) hours as needed. 120 tablet 0  . ibuprofen (ADVIL,MOTRIN) 800 MG tablet TAKE 1 TABLET BY MOUTH TWICE A DAY WITH FOOD AS NEEDED FOR PAIN 60 tablet 5  . Nebulizers (COMPRESSOR/NEBULIZER) MISC As directed 1 each 0  . PROAIR HFA 108 (90 Base) MCG/ACT inhaler INHALE 2 PUFFS BY MOUTH INTO THE LUNGS EVERY 4 HOURS AS NEEDED 8.5 Inhaler 2  . triamcinolone cream (KENALOG) 0.1 % APPLY 2 TIMES DAILY FOR 10 DAYS 30 g 1   No facility-administered medications prior to visit.    Past Medical History:  Diagnosis Date  . Asthma   . Colon polyps   . COPD (chronic obstructive pulmonary disease) ([place])   . OCD (obsessive  compulsive disorder)   . Panic disorder    Past Surgical History:  Procedure Laterality Date  . ECTOPIC PREGNANCY SURGERY     x 2   . LAPAROSCOPIC LYSIS INTESTINAL ADHESIONS     Social History   Social History  . Marital status: Divorced    Spouse name: N/A  . Number of children: 1  . Years of education: N/A   Occupational History  . 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 Smoking status: Former Smoker    Packs/day: 1.00    Years: 20.00    Types: Cigarettes    Quit date: 07/26/2011  . Smokeless tobacco: Never Used  . Alcohol use No  . Drug use: No  . Sexual activity: Yes    Birth control/ protection: None   Other Topics Concern  . None   Social History Narrative  . None   Family History  Problem Relation Age of Onset  . Heart attack Father   . Diabetes Mother   . Clotting disorder Mother     PE  . Breast cancer Sister   . Other Sister     colon mass-benign  . Esophageal cancer Neg Hx   . Colon cancer Neg Hx      Review of Systems Weight gain then loss - Tx thyroid Anxiety  over dx All other ROS negative or per HPI    Objective:   Physical Exam @BP  110/70 (BP Location: Right Arm)   Pulse 76   Ht 5\' 6"  (1.676 m)   Wt 192 lb (87.1 kg)   BMI 30.99 kg/m @  General:  Well-developed, well-nourished and in no acute distress Eyes:  anicteric. ENT:   Mouth and posterior pharynx free of lesions. + dentures Neck:   supple w/o thyromegaly or mass.  Lungs: Clear to auscultation bilaterally. Heart:  S1S2, no rubs, murmurs, gallops. Abdomen:  soft, non-tender, no hepatosplenomegaly, hernia, or mass and BS+.  Lymph:  no cervical or supraclavicular adenopathy. Extremities:   no edema, cyanosis or clubbing Skin   no rash. Neuro:  A&O x 3.  Psych:  appropriate mood and  Affect.   Data Reviewed:  CT surgery notes PCP notes Pulmonary notes ENT notes Thyroid US PET scan CT chest and images and showed to patient Labs in EMR 2012  colonoscopy report requested     Assessment & Plan:   Encounter Diagnoses  Name Primary?  Marland Kitchen Dysphagia Yes  . Thyromegaly   . Mediastinal mass    Not clear what is causing dysphagia but thyromegaly seems likely - will start w/u with barium swallow and tablet - ? Extrinsic compression vs dysmotility vs stricture - (least likely it seems)  Mediastinal mass is anterior and does not seem to impact esophagus. I do suppose if it is a malignancy then a paraneoplastic dysmotility syndrome could be possible.  I appreciate the opportunity to care for this patient. [id number] Ronnald Ramp, MD

## 2016-02-29 ENCOUNTER — Ambulatory Visit (HOSPITAL_COMMUNITY)
Admission: RE | Admit: 2016-02-29 | Discharge: 2016-02-29 | Disposition: A | Discharge status: Home / Self Care | Payer: Medicare Other | Source: Ambulatory Visit | Attending: Internal Medicine | Admitting: Internal Medicine

## 2016-02-29 DIAGNOSIS — R938 Abnormal findings on diagnostic imaging of other specified body structures: Secondary | ICD-10-CM | POA: Insufficient documentation

## 2016-02-29 DIAGNOSIS — R1310 Dysphagia, unspecified: Secondary | ICD-10-CM

## 2016-03-01 NOTE — Progress Notes (Signed)
This test is ok except for slight penetration of barium - but no aspiration Next step is EGD, possible esophageal dilation recommend she schedule that

## 2016-03-03 ENCOUNTER — Ambulatory Visit (INDEPENDENT_AMBULATORY_CARE_PROVIDER_SITE_OTHER): Payer: Medicare Other | Admitting: Cardiothoracic Surgery

## 2016-03-03 ENCOUNTER — Encounter: Payer: Self-pay | Admitting: Cardiothoracic Surgery

## 2016-03-03 VITALS — BP 122/73 | HR 56 | Resp 16 | Ht 66.5 in | Wt 193.5 lb

## 2016-03-03 DIAGNOSIS — R918 Other nonspecific abnormal finding of lung field: Secondary | ICD-10-CM | POA: Diagnosis not present
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
## 2016-03-08 DIAGNOSIS — J449 Chronic obstructive pulmonary disease, unspecified: Secondary | ICD-10-CM | POA: Diagnosis not present

## 2016-03-14 ENCOUNTER — Other Ambulatory Visit: Payer: Self-pay | Admitting: Internal Medicine

## 2016-03-14 ENCOUNTER — Ambulatory Visit: Payer: Medicare Other | Admitting: *Deleted

## 2016-03-14 VITALS — Ht 66.0 in | Wt 197.2 lb

## 2016-03-14 DIAGNOSIS — R1310 Dysphagia, unspecified: Secondary | ICD-10-CM

## 2016-03-14 NOTE — Progress Notes (Signed)
Patient denies any allergies to egg or soy products. Patient denies complications with anesthesia/sedation.  Patient denies oxygen use at home and denies diet medications. Emmi instructions for endoscopy explained and pamphlet given to patient.  

## 2016-03-15 ENCOUNTER — Other Ambulatory Visit: Payer: Self-pay | Admitting: Internal Medicine

## 2016-03-15 ENCOUNTER — Telehealth: Payer: Self-pay | Admitting: *Deleted

## 2016-03-15 ENCOUNTER — Other Ambulatory Visit: Payer: Self-pay | Admitting: *Deleted

## 2016-03-15 NOTE — Telephone Encounter (Signed)
Patient actually came into our office to advise [place] that is no longer taking 2 medication that were on her list. Acyclovir 400 mg, and Valcyclovir 100 mg.  Removed from her medication list today 03-15-2016.

## 2016-03-22 ENCOUNTER — Encounter: Payer: Self-pay | Admitting: Internal Medicine

## 2016-03-22 ENCOUNTER — Ambulatory Visit (AMBULATORY_SURGERY_CENTER): Payer: Medicare Other | Admitting: Internal Medicine

## 2016-03-22 VITALS — BP 118/69 | HR 55 | Temp 97.3°F | Resp 14 | Ht 66.0 in | Wt 197.0 lb

## 2016-03-22 DIAGNOSIS — K2970 Gastritis, unspecified, without bleeding: Secondary | ICD-10-CM

## 2016-03-22 DIAGNOSIS — R1310 Dysphagia, unspecified: Secondary | ICD-10-CM | POA: Diagnosis present

## 2016-03-22 MED ORDER — SODIUM CHLORIDE 0.9 % IV SOLN: 500.0000 mL | INTRAVENOUS | Status: DC

## 2016-03-22 NOTE — Patient Instructions (Addendum)
YOU HAD AN ENDOSCOPIC PROCEDURE TODAY AT [facility]:   Refer to the procedure report that was given to you for any specific questions about what was found during the examination.  If the procedure report does not answer your questions, please call your gastroenterologist to clarify.  If you requested that your care partner not be given the details of your procedure findings, then the procedure report has been included in a sealed envelope for you to review at your convenience later.  YOU SHOULD EXPECT: Some feelings of bloating in the abdomen. Passage of more gas than usual.  Walking can help get rid of the air that was put into your GI tract during the procedure and reduce the bloating. If you had a lower endoscopy (such as a colonoscopy or flexible sigmoidoscopy) you may notice spotting of blood in your stool or on the toilet paper. If you underwent a bowel prep for your procedure, you may not have a normal bowel movement for a few days.  Please Note:  You might notice some irritation and congestion in your nose or some drainage.  This is from the oxygen used during your procedure.  There is no need for concern and it should clear up in a day or so.  SYMPTOMS TO REPORT IMMEDIATELY:   Following upper endoscopy (EGD)  Vomiting of blood or coffee ground material  New chest pain or pain under the shoulder blades  Painful or persistently difficult swallowing  New shortness of breath  Fever of 100F or higher  Black, tarry-looking stools  For urgent or emergent issues, a gastroenterologist can be reached at any hour by calling [phone].   DIET:  We do recommend a small meal at first, but then you may proceed to your regular diet.  Drink plenty of fluids but you should avoid alcoholic beverages for 24 hours.  ACTIVITY:  You should plan to take it easy for the rest of today and you should NOT DRIVE or use heavy machinery until tomorrow (because of the sedation medicines used  during the test).    FOLLOW UP: Our staff will call the number listed on your records the next business day following your procedure to check on you and address any questions or concerns that you may have regarding the information given to you following your procedure. If we do not reach you, we will leave a message.  However, if you are feeling well and you are not experiencing any problems, there is no need to return our call.  We will assume that you have returned to your regular daily activities without incident.  If any biopsies were taken you will be contacted by phone or by letter within the next 1-3 weeks.  Please call us at [phone] if you have not heard about the biopsies in 3 weeks.    SIGNATURES/CONFIDENTIALITY: You and/or your care partner have signed paperwork which will be entered into your electronic medical record.  These signatures attest to the fact that that the information above on your After Visit Summary has been reviewed and is understood.  Full responsibility of the confidentiality of this discharge information lies with you and/or your care-partner.  Mild inflammation in the stomach. No other problems-esophagus looked normal. Stay on Nexium (esomeprazole). Go  back to Dr. Servando Snare.  I appreciate the opportunity to care for you.  Gatha Mayer, MD, [facility]

## 2016-03-22 NOTE — Op Note (Signed)
[facility] Patient Name: Felicia Odom Procedure Date: 03/22/2016 1:57 PM MRN: [id number] Endoscopist: Gatha Mayer , MD Age: 60 Referring MD:  Date of Birth: 06/19/1956 Gender: Female Account #: [id number] Procedure:                Upper GI endoscopy Indications:              Dysphagia Medicines:                Propofol per Anesthesia, Monitored Anesthesia Care Procedure:                Pre-Anesthesia Assessment:                           - Prior to the procedure, a History and Physical                            was performed, and patient medications and                            allergies were reviewed. The patient's tolerance of                            previous anesthesia was also reviewed. The risks                            and benefits of the procedure and the sedation                            options and risks were discussed with the patient.                            All questions were answered, and informed consent                            was obtained. Prior Anticoagulants: The patient has                            taken no previous anticoagulant or antiplatelet                            agents. ASA Grade Assessment: III - A patient with                            severe systemic disease. After reviewing the risks                            and benefits, the patient was deemed in                            satisfactory condition to undergo the procedure.                           After obtaining informed consent, the endoscope was  passed under direct vision. Throughout the                            procedure, the patient's blood pressure, pulse, and                            oxygen saturations were monitored continuously. The                            Model GIF-[id number]) scope was introduced                            through the mouth, and advanced to the second part                            of  duodenum. The upper GI endoscopy was                            accomplished without difficulty. The patient                            tolerated the procedure well. Scope In: Scope Out: Findings:                 Patchy mild inflammation characterized by erythema,                            friability and granularity was found in the                            prepyloric region of the stomach.                           The examined esophagus was normal.                           The exam was otherwise without abnormality.                           The cardia and gastric fundus were normal on                            retroflexion. Complications:            No immediate complications. Estimated Blood Loss:     Estimated blood loss was minimal. Impression:               - Gastritis.                           - Normal esophagus. No cause of dysphagia - which                            is much better at this time.                           - The examination was  otherwise normal.                           - No specimens collected. Recommendation:           - Patient has a contact number available for                            emergencies. The signs and symptoms of potential                            delayed complications were discussed with the                            patient. Return to normal activities tomorrow.                            Written discharge instructions were provided to the                            patient.                           - Resume previous diet.                           - Continue present medications.                           - No repeat upper endoscopy.                           - Follow-up with Dr. Servando Snare as planned. Gatha Mayer, MD 03/22/2016 2:22:04 PM This report has been signed electronically.

## 2016-03-22 NOTE — Progress Notes (Signed)
Teeth unchanged after procedure.A and O x3. Report to RN. Tolerated MAC anesthesia well. 

## 2016-03-23 ENCOUNTER — Telehealth: Payer: Self-pay | Admitting: *Deleted

## 2016-03-23 NOTE — Telephone Encounter (Signed)
  Follow up Call-  Call back number 03/22/2016  Post procedure Call Back phone  # [phone]  Permission to leave phone message Yes  Some recent data might be hidden     Patient questions:  Do you have a fever, pain , or abdominal swelling? No. Pain Score  0 *  Have you tolerated food without any problems? Yes.    Have you been able to return to your normal activities? Yes.    Do you have any questions about your discharge instructions: Diet   No. Medications  No. Follow up visit  No.  Do you have questions or concerns about your Care? No.  Actions: * If pain score is 4 or above: No action needed, pain <4.

## 2016-03-24 ENCOUNTER — Other Ambulatory Visit: Payer: Self-pay | Admitting: Cardiothoracic Surgery

## 2016-03-24 DIAGNOSIS — D150 Benign neoplasm of thymus: Principal | ICD-10-CM

## 2016-03-24 DIAGNOSIS — D4989 Neoplasm of unspecified behavior of other specified sites: Secondary | ICD-10-CM

## 2016-04-06 DIAGNOSIS — J449 Chronic obstructive pulmonary disease, unspecified: Secondary | ICD-10-CM | POA: Diagnosis not present

## 2016-04-07 ENCOUNTER — Other Ambulatory Visit: Payer: Self-pay | Admitting: Internal Medicine

## 2016-04-07 MED ORDER — FLUTICASONE-SALMETEROL 500-50 MCG/DOSE IN AEPB: INHALATION_SPRAY | RESPIRATORY_TRACT | 5 refills | Status: DC

## 2016-04-07 NOTE — Telephone Encounter (Signed)
Per CY-Please send Rx for Advair 500/50 n#1 Inhale 1 puff by mouth in to the lungs every 12 hours(rinse mouth afterwards) with 5 refills.

## 2016-04-21 ENCOUNTER — Encounter: Payer: Self-pay | Admitting: Cardiothoracic Surgery

## 2016-04-21 ENCOUNTER — Other Ambulatory Visit: Payer: Self-pay | Admitting: *Deleted

## 2016-04-21 ENCOUNTER — Ambulatory Visit
Admission: RE | Admit: 2016-04-21 | Discharge: 2016-04-21 | Disposition: A | Discharge status: Home / Self Care | Payer: Medicare Other | Source: Ambulatory Visit | Attending: Cardiothoracic Surgery | Admitting: Cardiothoracic Surgery

## 2016-04-21 ENCOUNTER — Ambulatory Visit (INDEPENDENT_AMBULATORY_CARE_PROVIDER_SITE_OTHER): Payer: Medicare Other | Admitting: Cardiothoracic Surgery

## 2016-04-21 VITALS — BP 133/83 | HR 71 | Resp 20 | Ht 66.0 in | Wt 191.0 lb

## 2016-04-21 DIAGNOSIS — D4989 Neoplasm of unspecified behavior of other specified sites: Secondary | ICD-10-CM

## 2016-04-21 DIAGNOSIS — D150 Benign neoplasm of thymus: Principal | ICD-10-CM

## 2016-04-21 DIAGNOSIS — J9859 Other diseases of mediastinum, not elsewhere classified: Secondary | ICD-10-CM

## 2016-04-21 DIAGNOSIS — R918 Other nonspecific abnormal finding of lung field: Secondary | ICD-10-CM

## 2016-04-21 DIAGNOSIS — R911 Solitary pulmonary nodule: Secondary | ICD-10-CM | POA: Diagnosis not present
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
## 2016-04-25 ENCOUNTER — Ambulatory Visit: Payer: Medicare Other | Admitting: Internal Medicine

## 2016-04-25 NOTE — Progress Notes (Signed)
Cardiology Office Note    Date:  04/27/2016   ID:  Felicia, Odom [birth year], MRN [id number]  PCP:  Scarlette Calico, MD  Cardiologist:  New  CC: pre op clearance   History of Present Illness:  Felicia Odom is a 60 y.o. female with a history of previous tobacco abuse, asthma/bronchitis, allergic rhinitis, OCD/panic, anxiety, obesity and GERD who presents for pre op clearance prior to partial sternotomy and resection of anterior mediastinal mass.  She is followed by Dr. Annamaria Boots in pulmonary. She has complained of chest tightness and SOB in the past. Chest x-ray documented nodule on lateral view seen as anterior mediastinal mass, approximately stable as of repeat CT on 10/12/2015.  She was seen 5 months ago, CT scan noted a small anterior mediastinal mass in addition a enlarged thyroid, which was hypermetabolic on PET scan. She underwent workup for her thyroid mass including needle biopsy which was benign.   She had some vague difficulty swallowing, had an esophagram done and upper GI endoscopy which showed some patchy mild inflammation in the prepyloric region of the stomach but esophagus was normal.  She has been seen by Dr. Servando Snare for surgical consult for mediastinal mass. 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 Overall, the imaging features favor a benign lesion such as a thymoma. Because of the continued growth he strongly recommended that they proceed with surgical resection. Since she had had symptoms of chest discomfort and shortness of breath with exertion she is referred to cardiology for pre op clearance.  Today she presents to clinic for evaluation. She does get some chest pain with exertion as well as significant DOE. The chest tightness does get better with rest. She does not exert herself often. The most exercise she gets in walking to the mailbox. Some mild LE edema, chronic orthopnea but no PND. She cannot sleep more than 2 hours at a time. She does feel  her heart flip flop a lot. Sometimes it flutters and makes her cough. She a lot of times her legs hurt a night. No exertional leg pain.   She has a family history of CHF in her mother and father. Her sister had a MI in her late 62s. The patient smoked on and off 30 years but now completely quit. She has struggle with her weight since going through menopause.    Past Medical History:  Diagnosis Date  . Arthritis    neck  . Asthma   . Colon polyps   . COPD (chronic obstructive pulmonary disease) ([place])   . GERD (gastroesophageal reflux disease)   . HSV infection   . Hyperlipidemia    diet controlled  . OCD (obsessive compulsive disorder)   . Panic disorder   . Wears partial dentures    upper and lower partials    Past Surgical History:  Procedure Laterality Date  . COLONOSCOPY    . ECTOPIC PREGNANCY SURGERY     x 2   . LAPAROSCOPIC LYSIS INTESTINAL ADHESIONS      Current Medications: Outpatient Medications Prior to Visit  Medication Sig Dispense Refill  . albuterol (PROVENTIL) (2.5 MG/3ML) 0.083% nebulizer solution Take 3 mLs (2.5 mg total) by nebulization every 6 (six) hours. Dx COPD with bronchitis 75 mL 12  . esomeprazole (NEXIUM) 40 MG capsule TAKE 1 CAPSULE (40 MG TOTAL) BY MOUTH DAILY. 30 capsule 6  . Fluticasone-Salmeterol (ADVAIR DISKUS) 500-50 MCG/DOSE AEPB INHALE 1 PUFF BY MOUTH INTO THE LUNGS EVERY 12 HOURS (RINSE  MOUTH AFTERWARDS) 1 each 5  . ibuprofen (ADVIL,MOTRIN) 800 MG tablet TAKE 1 TABLET BY MOUTH TWICE A DAY WITH FOOD AS NEEDED FOR PAIN 60 tablet 5  . PROAIR HFA 108 (90 Base) MCG/ACT inhaler INHALE 2 PUFFS BY MOUTH INTO THE LUNGS EVERY 4 HOURS AS NEEDED 8.5 Inhaler 2  . ALPRAZolam (XANAX) 1 MG tablet 1 every 6 hours, only if really needed 120 tablet 5  . HYDROcodone-acetaminophen (NORCO) 10-325 MG tablet Take 1 tablet by mouth every 6 (six) hours as needed. 120 tablet 0   Facility-Administered Medications Prior to Visit  Medication Dose Route Frequency  Provider Last Rate Last Dose  . 0.9 %  sodium chloride infusion  500 mL Intravenous Continuous Gatha Mayer, MD         Allergies:   Review of patient's allergies indicates no known allergies.   Social History   Social History  . Marital status: Divorced    Spouse name: N/A  . Number of children: 1  . Years of education: N/A   Occupational History  . Unit Network engineer at [employer]  . Smoking status: Former Smoker    Packs/day: 1.00    Years: 20.00    Types: Cigarettes    Quit date: 07/26/2011  . Smokeless tobacco: Never Used  . Alcohol use No  . Drug use: No  . Sexual activity: Yes    Birth control/ protection: None, Post-menopausal   Other Topics Concern  . None   Social History Narrative  . None     Family History:  The patient's family history includes Breast cancer in her sister; Clotting disorder in her mother; Diabetes in her mother; Heart attack in her father; Other in her sister.     ROS:   Please see the history of present illness.    ROS All other systems reviewed and are negative.   PHYSICAL EXAM:   VS:  BP 116/64 (BP Location: Right Arm)   Pulse 60   Ht 5\' 6"  (1.676 m)   Wt 193 lb 6.4 oz (87.7 kg)   SpO2 96%   BMI 31.22 kg/m    GEN: Well nourished, well developed, in no acute distress  HEENT: normal  Neck: no JVD, carotid bruits, or masses Cardiac: RRR; no murmurs, rubs, or gallops,no edema  Respiratory:  clear to auscultation bilaterally, normal work of breathing GI: soft, nontender, nondistended, + BS MS: no deformity or atrophy  Skin: warm and dry, no rash Neuro:  Alert and Oriented x 3, Strength and sensation are intact Psych: euthymic mood, full affect  Wt Readings from Last 3 Encounters:  04/27/16 193 lb 6.4 oz (87.7 kg)  04/21/16 191 lb (86.6 kg)  03/22/16 197 lb (89.4 kg)      Studies/Labs Reviewed:   EKG:  EKG is ordered today.  The ekg ordered today demonstrates sinus bradycardia HR 57, LAD, cannot  rule out anterior infarct vs poor R wave progression   Recent Labs: No results found for requested labs within last 8760 hours.   Lipid Panel    Component Value Date/Time   CHOL 225 (H) 01/21/2015 1537   TRIG 236.0 (H) 01/21/2015 1537   HDL 46.90 01/21/2015 1537   CHOLHDL 5 01/21/2015 1537   VLDL 47.2 (H) 01/21/2015 1537   LDLDIRECT 142.0 01/21/2015 1537    Additional studies/ records that were reviewed today include:   Office Spirometry 10/21/2015-slight restriction of exhaled volume, mild obstruction. FVC 2.69/78%, FEV1 2.04/76%,  FEV1/FVC 0.76, FEF 25-75 percent 1.68/65%.    ASSESSMENT & PLAN:   Felicia Odom is a 60 y.o. female with a history of previous tobacco abuse, asthma/bronchitis, allergic rhinitis, OCD/panic, anxiety, and GERD who presents for pre op clearance prior to partial sternotomy and resection of anterior mediastinal mass.  Pre-op clearance: she has had some exertional chest pain and sob. Will obtain a lexiscan myoview with her chest pain and DOE. She also has some trace LE edema and orthopnea. She does not appear volume overloaded but will get a 2D ECHO to evaluate structure and function of heart. If all resting is reassuring, she can proceed with surgery  HLD with aortic athlersclerosis (noted on chest CT): LDL 142 last year. Will start atorvastatin 40 mg for primary prevention  Obesity: Body mass index is 31.22 kg/m. She is very upset about increased weight. She really wants to focus on diet and exercise.    Medication Adjustments/Labs and Tests Ordered: Current medicines are reviewed at length with the patient today.  Concerns regarding medicines are outlined above.  Medication changes, Labs and Tests ordered today are listed in the Patient Instructions below. Patient Instructions  Medication Instructions:  Your physician has recommended you make the following change in your medication:  1.  START Atorvastatin 40 mg taking 1 tablet  daily   Labwork: None ordered  Testing/Procedures: Your physician has requested that you have a lexiscan myoview. For further information please visit [email or web address]. Please follow instruction sheet, as given.  Your physician has requested that you have an echocardiogram. Echocardiography is a painless test that uses sound waves to create images of your heart. It provides your doctor with information about the size and shape of your heart and how well your heart's chambers and valves are working. This procedure takes approximately one hour. There are no restrictions for this procedure.   Follow-Up: Your physician recommends that you schedule a follow-up appointment in: WILL BE DETERMINED AFTER ALL YOUR TESTING IS COMPLETED   Any Other Special Instructions Will Be Listed Below (If Applicable). Pharmacologic Stress Electrocardiogram A pharmacologic stress electrocardiogram is a heart (cardiac) test that uses nuclear imaging to evaluate the blood supply to your heart. This test may also be called a pharmacologic stress electrocardiography. Pharmacologic means that a medicine is used to increase your heart rate and blood pressure.  This stress test is done to find areas of poor blood flow to the heart by determining the extent of coronary artery disease (CAD). Some people exercise on a treadmill, which naturally increases the blood flow to the heart. For those people unable to exercise on a treadmill, a medicine is used. This medicine stimulates your heart and will cause your heart to beat harder and more quickly, as if you were exercising.  Pharmacologic stress tests can help determine:  The adequacy of blood flow to your heart during increased levels of activity in order to clear you for discharge home.  The extent of coronary artery blockage caused by CAD.  Your prognosis if you have suffered a heart attack.  The effectiveness of cardiac procedures done, such as an angioplasty, which  can increase the circulation in your coronary arteries.  Causes of chest pain or pressure. LET [facility] CARE PROVIDER KNOW ABOUT:  Any allergies you have.  All medicines you are taking, including vitamins, herbs, eye drops, creams, and over-the-counter medicines.  Previous problems you or members of your family have had with the use of anesthetics.  Any blood disorders  you have.  Previous surgeries you have had.  Medical conditions you have.  Possibility of pregnancy, if this applies.  If you are currently breastfeeding. RISKS AND COMPLICATIONS Generally, this is a safe procedure. However, as with any procedure, complications can occur. Possible complications include:  You develop pain or pressure in the following areas:  Chest.  Jaw or neck.  Between your shoulder blades.  Radiating down your left arm.  Headache.  Dizziness or light-headedness.  Shortness of breath.  Increased or irregular heartbeat.  Low blood pressure.  Nausea or vomiting.  Flushing.  Redness going up the arm and slight pain during injection of medicine.  Heart attack (rare). BEFORE THE PROCEDURE   Avoid all forms of caffeine for 24 hours before your test or as directed by your health care provider. This includes coffee, tea (even decaffeinated tea), caffeinated sodas, chocolate, cocoa, and certain pain medicines.  Follow your health care provider's instructions regarding eating and drinking before the test.  Take your medicines as directed at regular times with water unless instructed otherwise. Exceptions may include:  If you have diabetes, ask how you are to take your insulin or pills. It is common to adjust insulin dosing the morning of the test.  If you are taking beta-blocker medicines, it is important to talk to your health care provider about these medicines well before the date of your test. Taking beta-blocker medicines may interfere with the test. In some cases, these  medicines need to be changed or stopped 24 hours or more before the test.  If you wear a nitroglycerin patch, it may need to be removed prior to the test. Ask your health care provider if the patch should be removed before the test.  If you use an inhaler for any breathing condition, bring it with you to the test.  If you are an outpatient, bring a snack so you can eat right after the stress phase of the test.  Do not smoke for 4 hours prior to the test or as directed by your health care provider.  Do not apply lotions, powders, creams, or oils on your chest prior to the test.  Wear comfortable shoes and clothing. Let your health care provider know if you were unable to complete or follow the preparations for your test. PROCEDURE   Multiple patches (electrodes) will be put on your chest. If needed, small areas of your chest may be shaved to get better contact with the electrodes. Once the electrodes are attached to your body, multiple wires will be attached to the electrodes, and your heart rate will be monitored.  An IV access will be started. A nuclear trace (isotope) is given. The isotope may be given intravenously, or it may be swallowed. Nuclear refers to several types of radioactive isotopes, and the nuclear isotope lights up the arteries so that the nuclear images are clear. The isotope is absorbed by your body. This results in low radiation exposure.  A resting nuclear image is taken to show how your heart functions at rest.  A medicine is given through the IV access.  A second scan is done about 1 hour after the medicine injection and determines how your heart functions under stress.  During this stress phase, you will be connected to an electrocardiogram machine. Your blood pressure and oxygen levels will be monitored. AFTER THE PROCEDURE   Your heart rate and blood pressure will be monitored after the test.  You may return to your normal schedule, including  diet,activities,  and medicines, unless your health care provider tells you otherwise.   This information is not intended to replace advice given to you by your health care provider. Make sure you discuss any questions you have with your health care provider.   Document Released: 11/27/2008 Document Revised: 07/16/2013 Document Reviewed: 03/18/2013 Elsevier Interactive Patient Education 2016 [employer].  Echocardiogram An echocardiogram, or echocardiography, uses sound waves (ultrasound) to produce an image of your heart. The echocardiogram is simple, painless, obtained within a short period of time, and offers valuable information to your health care provider. The images from an echocardiogram can provide information such as:  Evidence of coronary artery disease (CAD).  Heart size.  Heart muscle function.  Heart valve function.  Aneurysm detection.  Evidence of a past heart attack.  Fluid buildup around the heart.  Heart muscle thickening.  Assess heart valve function. LET [facility] CARE PROVIDER KNOW ABOUT:  Any allergies you have.  All medicines you are taking, including vitamins, herbs, eye drops, creams, and over-the-counter medicines.  Previous problems you or members of your family have had with the use of anesthetics.  Any blood disorders you have.  Previous surgeries you have had.  Medical conditions you have.  Possibility of pregnancy, if this applies. BEFORE THE PROCEDURE  No special preparation is needed. Eat and drink normally.  PROCEDURE   In order to produce an image of your heart, gel will be applied to your chest and a wand-like tool (transducer) will be moved over your chest. The gel will help transmit the sound waves from the transducer. The sound waves will harmlessly bounce off your heart to allow the heart images to be captured in real-time motion. These images will then be recorded.  You may need an IV to receive a medicine that improves the quality of the  pictures. AFTER THE PROCEDURE You may return to your normal schedule including diet, activities, and medicines, unless your health care provider tells you otherwise.   This information is not intended to replace advice given to you by your health care provider. Make sure you discuss any questions you have with your health care provider.   Document Released: 07/08/2000 Document Revised: 08/01/2014 Document Reviewed: 03/18/2013 Elsevier Interactive Patient Education [employer].    If you need a refill on your cardiac medications before your next appointment, please call your pharmacy.      Signed, Mechelle Vanharen, PA-C  04/27/2016 8:39 AM    [facility] [address], [place], [place]  [address] Phone: [phone]; Fax: [phone]

## 2016-04-26 ENCOUNTER — Encounter: Payer: Self-pay | Admitting: Physician Assistant

## 2016-04-27 ENCOUNTER — Encounter: Payer: Self-pay | Admitting: Physician Assistant

## 2016-04-27 ENCOUNTER — Ambulatory Visit (INDEPENDENT_AMBULATORY_CARE_PROVIDER_SITE_OTHER): Payer: Medicare Other | Admitting: Physician Assistant

## 2016-04-27 ENCOUNTER — Ambulatory Visit (HOSPITAL_COMMUNITY)
Admission: RE | Admit: 2016-04-27 | Discharge: 2016-04-27 | Disposition: A | Discharge status: Home / Self Care | Payer: Medicare Other | Source: Ambulatory Visit | Attending: Cardiothoracic Surgery | Admitting: Cardiothoracic Surgery

## 2016-04-27 VITALS — BP 116/64 | HR 60 | Ht 66.0 in | Wt 193.4 lb

## 2016-04-27 DIAGNOSIS — E785 Hyperlipidemia, unspecified: Secondary | ICD-10-CM

## 2016-04-27 DIAGNOSIS — Z01818 Encounter for other preprocedural examination: Secondary | ICD-10-CM

## 2016-04-27 DIAGNOSIS — R079 Chest pain, unspecified: Secondary | ICD-10-CM

## 2016-04-27 DIAGNOSIS — J449 Chronic obstructive pulmonary disease, unspecified: Secondary | ICD-10-CM | POA: Diagnosis not present

## 2016-04-27 DIAGNOSIS — R942 Abnormal results of pulmonary function studies: Secondary | ICD-10-CM | POA: Diagnosis not present

## 2016-04-27 DIAGNOSIS — R0609 Other forms of dyspnea: Secondary | ICD-10-CM

## 2016-04-27 DIAGNOSIS — J9859 Other diseases of mediastinum, not elsewhere classified: Secondary | ICD-10-CM | POA: Diagnosis not present

## 2016-04-27 DIAGNOSIS — I700 Atherosclerosis of aorta: Secondary | ICD-10-CM

## 2016-04-27 LAB — PULMONARY FUNCTION TEST
DL/VA % pred: 104 %
DL/VA: 5.25 ml/min/mmHg/L
DLCO unc % pred: 76 %
DLCO unc: 20.7 ml/min/mmHg
FEF 25-75 Post: 1.81 L/sec
FEF 25-75 Pre: 1.44 L/sec
FEF2575-%Change-Post: 25 %
FEF2575-%Pred-Post: 72 %
FEF2575-%Pred-Pre: 57 %
FEV1-%Change-Post: 5 %
FEV1-%Pred-Post: 76 %
FEV1-%Pred-Pre: 72 %
FEV1-Post: 2.11 L
FEV1-Pre: 2 L
FEV1FVC-%Change-Post: 2 %
FEV1FVC-%Pred-Pre: 95 %
FEV6-%Change-Post: 5 %
FEV6-%Pred-Post: 80 %
FEV6-%Pred-Pre: 75 %
FEV6-Post: 2.76 L
FEV6-Pre: 2.62 L
FEV6FVC-%Change-Post: 1 %
FEV6FVC-%Pred-Post: 101 %
FEV6FVC-%Pred-Pre: 100 %
FVC-%Change-Post: 3 %
FVC-%Pred-Post: 78 %
FVC-%Pred-Pre: 75 %
FVC-Post: 2.79 L
FVC-Pre: 2.7 L
Post FEV1/FVC ratio: 76 %
Post FEV6/FVC ratio: 99 %
Pre FEV1/FVC ratio: 74 %
Pre FEV6/FVC Ratio: 97 %
RV % pred: 107 %
RV: 2.25 L
TLC % pred: 92 %
TLC: 4.97 L

## 2016-04-27 MED ORDER — ALBUTEROL SULFATE (2.5 MG/3ML) 0.083% IN NEBU
2.5000 mg | INHALATION_SOLUTION | Freq: Once | RESPIRATORY_TRACT | Status: AC
  Administered 2016-04-27: 2.5 mg via RESPIRATORY_TRACT

## 2016-04-27 MED ORDER — ATORVASTATIN CALCIUM 40 MG PO TABS: 40.0000 mg | ORAL_TABLET | Freq: Every day | ORAL | 3 refills | Status: DC

## 2016-04-27 NOTE — Patient Instructions (Addendum)
Medication Instructions:  Your physician has recommended you make the following change in your medication:  1.  START Atorvastatin 40 mg taking 1 tablet daily   Labwork: None ordered  Testing/Procedures: Your physician has requested that you have a lexiscan myoview. For further information please visit [email or web address]. Please follow instruction sheet, as given.  Your physician has requested that you have an echocardiogram. Echocardiography is a painless test that uses sound waves to create images of your heart. It provides your doctor with information about the size and shape of your heart and how well your heart's chambers and valves are working. This procedure takes approximately one hour. There are no restrictions for this procedure.   Follow-Up: Your physician recommends that you schedule a follow-up appointment in: WILL BE DETERMINED AFTER ALL YOUR TESTING IS COMPLETED   Any Other Special Instructions Will Be Listed Below (If Applicable). Pharmacologic Stress Electrocardiogram A pharmacologic stress electrocardiogram is a heart (cardiac) test that uses nuclear imaging to evaluate the blood supply to your heart. This test may also be called a pharmacologic stress electrocardiography. Pharmacologic means that a medicine is used to increase your heart rate and blood pressure.  This stress test is done to find areas of poor blood flow to the heart by determining the extent of coronary artery disease (CAD). Some people exercise on a treadmill, which naturally increases the blood flow to the heart. For those people unable to exercise on a treadmill, a medicine is used. This medicine stimulates your heart and will cause your heart to beat harder and more quickly, as if you were exercising.  Pharmacologic stress tests can help determine:  The adequacy of blood flow to your heart during increased levels of activity in order to clear you for discharge home.  The extent of coronary artery  blockage caused by CAD.  Your prognosis if you have suffered a heart attack.  The effectiveness of cardiac procedures done, such as an angioplasty, which can increase the circulation in your coronary arteries.  Causes of chest pain or pressure. LET [facility] CARE PROVIDER KNOW ABOUT:  Any allergies you have.  All medicines you are taking, including vitamins, herbs, eye drops, creams, and over-the-counter medicines.  Previous problems you or members of your family have had with the use of anesthetics.  Any blood disorders you have.  Previous surgeries you have had.  Medical conditions you have.  Possibility of pregnancy, if this applies.  If you are currently breastfeeding. RISKS AND COMPLICATIONS Generally, this is a safe procedure. However, as with any procedure, complications can occur. Possible complications include:  You develop pain or pressure in the following areas:  Chest.  Jaw or neck.  Between your shoulder blades.  Radiating down your left arm.  Headache.  Dizziness or light-headedness.  Shortness of breath.  Increased or irregular heartbeat.  Low blood pressure.  Nausea or vomiting.  Flushing.  Redness going up the arm and slight pain during injection of medicine.  Heart attack (rare). BEFORE THE PROCEDURE   Avoid all forms of caffeine for 24 hours before your test or as directed by your health care provider. This includes coffee, tea (even decaffeinated tea), caffeinated sodas, chocolate, cocoa, and certain pain medicines.  Follow your health care provider's instructions regarding eating and drinking before the test.  Take your medicines as directed at regular times with water unless instructed otherwise. Exceptions may include:  If you have diabetes, ask how you are to take your insulin or pills. It is  common to adjust insulin dosing the morning of the test.  If you are taking beta-blocker medicines, it is important to talk to your  health care provider about these medicines well before the date of your test. Taking beta-blocker medicines may interfere with the test. In some cases, these medicines need to be changed or stopped 24 hours or more before the test.  If you wear a nitroglycerin patch, it may need to be removed prior to the test. Ask your health care provider if the patch should be removed before the test.  If you use an inhaler for any breathing condition, bring it with you to the test.  If you are an outpatient, bring a snack so you can eat right after the stress phase of the test.  Do not smoke for 4 hours prior to the test or as directed by your health care provider.  Do not apply lotions, powders, creams, or oils on your chest prior to the test.  Wear comfortable shoes and clothing. Let your health care provider know if you were unable to complete or follow the preparations for your test. PROCEDURE   Multiple patches (electrodes) will be put on your chest. If needed, small areas of your chest may be shaved to get better contact with the electrodes. Once the electrodes are attached to your body, multiple wires will be attached to the electrodes, and your heart rate will be monitored.  An IV access will be started. A nuclear trace (isotope) is given. The isotope may be given intravenously, or it may be swallowed. Nuclear refers to several types of radioactive isotopes, and the nuclear isotope lights up the arteries so that the nuclear images are clear. The isotope is absorbed by your body. This results in low radiation exposure.  A resting nuclear image is taken to show how your heart functions at rest.  A medicine is given through the IV access.  A second scan is done about 1 hour after the medicine injection and determines how your heart functions under stress.  During this stress phase, you will be connected to an electrocardiogram machine. Your blood pressure and oxygen levels will be monitored. AFTER  THE PROCEDURE   Your heart rate and blood pressure will be monitored after the test.  You may return to your normal schedule, including diet,activities, and medicines, unless your health care provider tells you otherwise.   This information is not intended to replace advice given to you by your health care provider. Make sure you discuss any questions you have with your health care provider.   Document Released: 11/27/2008 Document Revised: 07/16/2013 Document Reviewed: 03/18/2013 Elsevier Interactive Patient Education 2016 [employer].  Echocardiogram An echocardiogram, or echocardiography, uses sound waves (ultrasound) to produce an image of your heart. The echocardiogram is simple, painless, obtained within a short period of time, and offers valuable information to your health care provider. The images from an echocardiogram can provide information such as:  Evidence of coronary artery disease (CAD).  Heart size.  Heart muscle function.  Heart valve function.  Aneurysm detection.  Evidence of a past heart attack.  Fluid buildup around the heart.  Heart muscle thickening.  Assess heart valve function. LET [facility] CARE PROVIDER KNOW ABOUT:  Any allergies you have.  All medicines you are taking, including vitamins, herbs, eye drops, creams, and over-the-counter medicines.  Previous problems you or members of your family have had with the use of anesthetics.  Any blood disorders you have.  Previous surgeries you have had.  Medical conditions you have.  Possibility of pregnancy, if this applies. BEFORE THE PROCEDURE  No special preparation is needed. Eat and drink normally.  PROCEDURE   In order to produce an image of your heart, gel will be applied to your chest and a wand-like tool (transducer) will be moved over your chest. The gel will help transmit the sound waves from the transducer. The sound waves will harmlessly bounce off your heart to allow the heart  images to be captured in real-time motion. These images will then be recorded.  You may need an IV to receive a medicine that improves the quality of the pictures. AFTER THE PROCEDURE You may return to your normal schedule including diet, activities, and medicines, unless your health care provider tells you otherwise.   This information is not intended to replace advice given to you by your health care provider. Make sure you discuss any questions you have with your health care provider.   Document Released: 07/08/2000 Document Revised: 08/01/2014 Document Reviewed: 03/18/2013 Elsevier Interactive Patient Education [employer].    If you need a refill on your cardiac medications before your next appointment, please call your pharmacy.

## 2016-04-29 ENCOUNTER — Telehealth: Payer: Self-pay | Admitting: Internal Medicine

## 2016-04-29 DIAGNOSIS — J449 Chronic obstructive pulmonary disease, unspecified: Secondary | ICD-10-CM | POA: Diagnosis not present

## 2016-04-29 MED ORDER — PROMETHAZINE-CODEINE 6.25-10 MG/5ML PO SYRP: 5.0000 mL | ORAL_SOLUTION | Freq: Four times a day (QID) | ORAL | 0 refills | Status: DC | PRN

## 2016-04-29 NOTE — Telephone Encounter (Signed)
Ok to refill 

## 2016-04-29 NOTE — Telephone Encounter (Signed)
Called and lmom for pt to make her aware that we have called this to her pharmacy.  Nothing further is needed.

## 2016-04-29 NOTE — Telephone Encounter (Signed)
Pt is aware that I will send message to [facility] for approval.   Pharmacy: [facility].

## 2016-05-03 ENCOUNTER — Telehealth: Payer: Self-pay | Admitting: Internal Medicine

## 2016-05-03 NOTE — Telephone Encounter (Signed)
Pt can be seen 05/05/16 at 3:00pm. Thanks.

## 2016-05-03 NOTE — Telephone Encounter (Signed)
Spoke with pt and given appt with Dr Annamaria Boots 05/05/16 at 3:00.

## 2016-05-05 ENCOUNTER — Ambulatory Visit (INDEPENDENT_AMBULATORY_CARE_PROVIDER_SITE_OTHER): Payer: Medicare Other | Admitting: Internal Medicine

## 2016-05-05 ENCOUNTER — Encounter: Payer: Self-pay | Admitting: Internal Medicine

## 2016-05-05 DIAGNOSIS — F410 Panic disorder [episodic paroxysmal anxiety] without agoraphobia: Secondary | ICD-10-CM | POA: Diagnosis not present

## 2016-05-05 DIAGNOSIS — J45909 Unspecified asthma, uncomplicated: Secondary | ICD-10-CM

## 2016-05-05 MED ORDER — FLUTICASONE-SALMETEROL 500-50 MCG/DOSE IN AEPB: INHALATION_SPRAY | RESPIRATORY_TRACT | 12 refills | Status: DC

## 2016-05-05 MED ORDER — ALBUTEROL SULFATE HFA 108 (90 BASE) MCG/ACT IN AERS: 1.0000 | INHALATION_SPRAY | RESPIRATORY_TRACT | 12 refills | Status: DC | PRN

## 2016-05-05 MED ORDER — ALBUTEROL SULFATE (2.5 MG/3ML) 0.083% IN NEBU: 2.5000 mg | INHALATION_SOLUTION | Freq: Four times a day (QID) | RESPIRATORY_TRACT | 12 refills | Status: DC

## 2016-05-05 MED ORDER — PROMETHAZINE-CODEINE 6.25-10 MG/5ML PO SYRP: 5.0000 mL | ORAL_SOLUTION | Freq: Four times a day (QID) | ORAL | 0 refills | Status: DC | PRN

## 2016-05-05 MED ORDER — ALPRAZOLAM 1 MG PO TABS: 1.0000 mg | ORAL_TABLET | Freq: Four times a day (QID) | ORAL | 5 refills | Status: DC | PRN

## 2016-05-05 NOTE — Progress Notes (Signed)
Patient ID: Felicia Odom, female    DOB: Jan 30, 1956, 60 y.o.   MRN: [id number]  HPI F former smoker followed for COPD, Allergic rhinitis, complicated by thymoma, anxiety .  01/29/15- 62 yo former smoker followed for Asthma with bronchitis, Allergic rhinitis,complicated by  hx OCD/panic, anxiety, GERD FOLLOWS FOR: SOB has increased, wheezing first thing in the morning. Occ cp and tightness. We reviewed x-ray images. She asks that Adderall, Xanax and her cough syrup he refilled. We discussed these. Adderall had to help significantly with focus while she was in school but I think the mostly address her anxiety disorder now and may eventually be best managed by her primary physician when she gets established. [place] and we discussed altitude and medications to keep available. Office spirometry- 01/29/15 Normal spirometry. FVC 2.74/81%, FEV1 2.11/79%, FEV1/FVC 0.77, FEF 25-75 percent 1.79 CXR 09/29/14-  IMPRESSION: 1. No radiographic evidence of acute cardiopulmonary disease. 2. Small bone island in the anterior aspect of the right third rib accounts for the perceived abnormality on prior chest x-rays. No definite pulmonary nodule noted. Electronically Signed  By: [name] M.D.  On: 09/29/2014 15:50  10/21/2015-60 year old female former smoker followed for asthma/bronchitis, allergic rhinitis, complicated by OCD/panic, anxiety, GERD FOLLOWS FOR: pt states she needs refills on xanax,nexium,ibuprofen,phenergan w/ codeine & sinus med. c/o increased SOB, wheezing, prod cough green to yellow in color, chest pain/tightness. Chest x-ray documented nodule on lateral view seen as anterior mediastinal mass, approximately stable as of repeat CT on 10/12/2015. I reviewed images with her Office Spirometry 10/21/2015-slight restriction of exhaled volume, mild obstruction. FVC 2.69/78%, FEV1 2.04/76%, FEV1/FVC 0.76, FEF 25-75 percent 1.68/65%. 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 Primary differential considerations include an abnormal lymph node, thymoma, or less likely a germ-cell tumor. No calcifications or aggressive imaging features. Thoracic surgery consultation is suggested. If follow-up imaging is chosen as the appropriate course of action, consider PET-CT to help assess malignant potential as clinically warranted. These results will be called to the ordering clinician or representative by the Radiologist Assistant, and communication documented in the PACS or zVision Dashboard. Electronically Signed  By: Julian Hy M.D.  On: 10/12/2015 11:17  05/05/2016-60 year old female former smoker followed for asthma/bronchitis, allergic rhinitis, complicated by thymoma/ TSGY, OCD/panic, anxiety, GERD FOLLOWS FOR: Pt states she needs peace of mind from CY about clearance for removal of mass from pulmonary stand point. Also, patient would like to have all refills.  Not smoking. Little cough or wheeze she has some anxiety about proposed thymectomy but is prepared to go ahead. No recent acute illness. CT chest 04/21/2016 IMPRESSION: 1. Interval growth of the previously noted well-circumscribed soft tissue attenuation lesion in the anterior mediastinum. This lesion is well separated from the thyroid gland, and demonstrates no internal cystic change and no calcification. There is no associated pleural nodularity or pleural effusion. Overall, the imaging features favor a benign lesion such as a thymoma, however, the continued interval growth is concerning. Biopsy or resection of the lesion is recommended if clinically appropriate. 2. There are calcifications of the aortic valve. Echocardiographic correlation for evaluation of potential valvular dysfunction may be warranted if clinically indicated. 3. 2.1 x 1.8 cm nodule in the  right lobe of the thyroid gland appears similar to the prior examination, per report, previously biopsied and benign. 4. Aortic atherosclerosis. Electronically Signed   By:  [name] M.D.   On: 04/21/2016 11:44  PFT 04/27/2016-minimal obstructive airways disease, minimal diffusion defect, insignificant response to bronchodilator. FVC 2.79/78%, FEV1 2.11/76%, ratio 0.76, FEF 25-75 percent 1.81/72%, TLC 92%, DLCO 76%.     Review of Systems- see HPI Constitutional:   No-   weight loss, night sweats, fevers, chills, fatigue, lassitude. HEENT:   No-  headaches, difficulty swallowing, tooth/dental problems, sore throat,       No-  sneezing, itching, ear ache, nasal congestion, post nasal drip,  CV:  No- chest pain, No-orthopnea, PND, swelling in lower extremities, anasarca, dizziness, palpitations Resp: + shortness of breath with exertion or at rest.              productive cough,   non-productive cough,  No- coughing up of blood.            change in color of mucus.  No- wheezing.   Skin: No-   rash or lesions. GI:    heartburn, indigestion, No-abdominal pain, nausea, vomiting,  GU:  MS:  + joint pain or swelling.  . Neuro-     nothing unusual Psych: +change in mood or affect. +Chronic depression , + anxiety.  No memory loss.   Objective:   Physical Exam General- Alert, Oriented , Distress- none acute,  Overweight,  Skin- rash-none, lesions- none, excoriation- none Lymphadenopathy- none Head- atraumatic            Eyes- Gross vision intact, PERRLA, conjunctivae clear secretions            Ears- Hearing, canals-normal            Nose- No- rhinorrhea, no-Septal dev,, polyps, erosion, perforation             Throat- Mallampati II , mucosa clear , drainage- none, tonsils- atrophic. + dentures Neck- flexible , trachea midline, no stridor , thyroid nl, carotid no bruit Chest - symmetrical excursion , unlabored           Heart/CV- RRR , no murmur , no gallop, no rub, nl s1 s2                            - JVD- none , edema- none, stasis changes- none, varices- none           Lung- wheeze-none, cough-none , dullness-none, rub- none,            Chest wall- . Abd-  Br/ Gen/ Rectal- Not done, not indicated Extrem-  Neuro- grossly intact to observation

## 2016-05-05 NOTE — Patient Instructions (Signed)
Med refills done   Please call as needed

## 2016-05-07 NOTE — Assessment & Plan Note (Signed)
After discussion I agreed to refill Xanax, encouraging sparing use.

## 2016-05-07 NOTE — Assessment & Plan Note (Signed)
Pulmonary function is well-preserved and she is not smoking. Plan-medication refills with appropriate discussion

## 2016-05-10 ENCOUNTER — Other Ambulatory Visit: Payer: Self-pay | Admitting: Internal Medicine

## 2016-05-10 DIAGNOSIS — M159 Polyosteoarthritis, unspecified: Secondary | ICD-10-CM

## 2016-05-10 DIAGNOSIS — M150 Primary generalized (osteo)arthritis: Principal | ICD-10-CM

## 2016-05-10 DIAGNOSIS — M5030 Other cervical disc degeneration, unspecified cervical region: Secondary | ICD-10-CM

## 2016-05-12 ENCOUNTER — Telehealth (HOSPITAL_COMMUNITY): Payer: Self-pay | Admitting: *Deleted

## 2016-05-12 NOTE — Telephone Encounter (Signed)
Patient given detailed instructions per Myocardial Perfusion Study Information Sheet for the test on 05/17/16. Patient notified to arrive 15 minutes early and that it is imperative to arrive on time for appointment to keep from having the test rescheduled.  If you need to cancel or reschedule your appointment, please call the office within 24 hours of your appointment. Failure to do so may result in a cancellation of your appointment, and a $50 no show fee. Patient verbalized understanding. Kalid Ghan Jacqueline    

## 2016-05-17 ENCOUNTER — Ambulatory Visit (HOSPITAL_BASED_OUTPATIENT_CLINIC_OR_DEPARTMENT_OTHER): Payer: Medicare Other

## 2016-05-17 ENCOUNTER — Ambulatory Visit (HOSPITAL_COMMUNITY): Payer: Medicare Other | Attending: Cardiology

## 2016-05-17 ENCOUNTER — Other Ambulatory Visit: Payer: Self-pay

## 2016-05-17 VITALS — Wt 193.0 lb

## 2016-05-17 DIAGNOSIS — R0609 Other forms of dyspnea: Secondary | ICD-10-CM | POA: Diagnosis not present

## 2016-05-17 DIAGNOSIS — E785 Hyperlipidemia, unspecified: Secondary | ICD-10-CM | POA: Diagnosis not present

## 2016-05-17 DIAGNOSIS — R079 Chest pain, unspecified: Secondary | ICD-10-CM | POA: Insufficient documentation

## 2016-05-17 DIAGNOSIS — Z8249 Family history of ischemic heart disease and other diseases of the circulatory system: Secondary | ICD-10-CM | POA: Diagnosis not present

## 2016-05-17 DIAGNOSIS — Z87891 Personal history of nicotine dependence: Secondary | ICD-10-CM | POA: Insufficient documentation

## 2016-05-17 DIAGNOSIS — Z01818 Encounter for other preprocedural examination: Secondary | ICD-10-CM

## 2016-05-17 DIAGNOSIS — R9439 Abnormal result of other cardiovascular function study: Secondary | ICD-10-CM | POA: Insufficient documentation

## 2016-05-17 DIAGNOSIS — R0602 Shortness of breath: Secondary | ICD-10-CM

## 2016-05-17 DIAGNOSIS — J449 Chronic obstructive pulmonary disease, unspecified: Secondary | ICD-10-CM | POA: Insufficient documentation

## 2016-05-17 LAB — MYOCARDIAL PERFUSION IMAGING
CHL CUP RESTING HR STRESS: 62 {beats}/min
LV sys vol: 39 mL
LVDIAVOL: 89 mL (ref 46–106)
NUC STRESS TID: 1.15
Peak HR: 107 {beats}/min
RATE: 0.28
SDS: 4
SRS: 5
SSS: 9

## 2016-05-17 MED ORDER — REGADENOSON 0.4 MG/5ML IV SOLN
0.4000 mg | Freq: Once | INTRAVENOUS | Status: AC
  Administered 2016-05-17: 0.4 mg via INTRAVENOUS

## 2016-05-17 MED ORDER — TECHNETIUM TC 99M TETROFOSMIN IV KIT
10.5000 | PACK | Freq: Once | INTRAVENOUS | Status: AC | PRN
  Administered 2016-05-17: 10.5 via INTRAVENOUS
  Filled 2016-05-17: qty 11

## 2016-05-17 MED ORDER — AMINOPHYLLINE 25 MG/ML IV SOLN
75.0000 mg | Freq: Once | INTRAVENOUS | Status: AC
  Administered 2016-05-17: 75 mg via INTRAVENOUS

## 2016-05-17 MED ORDER — TECHNETIUM TC 99M TETROFOSMIN IV KIT
32.2000 | PACK | Freq: Once | INTRAVENOUS | Status: AC | PRN
  Administered 2016-05-17: 32.2 via INTRAVENOUS
  Filled 2016-05-17: qty 33

## 2016-05-19 ENCOUNTER — Ambulatory Visit: Payer: Medicare Other | Admitting: Cardiothoracic Surgery

## 2016-05-20 ENCOUNTER — Other Ambulatory Visit: Payer: Self-pay | Admitting: Internal Medicine

## 2016-05-20 NOTE — Telephone Encounter (Signed)
CY Please advise on refill. Thanks.  

## 2016-05-23 ENCOUNTER — Ambulatory Visit (INDEPENDENT_AMBULATORY_CARE_PROVIDER_SITE_OTHER): Payer: Medicare Other | Admitting: Internal Medicine

## 2016-05-23 ENCOUNTER — Encounter: Payer: Self-pay | Admitting: Internal Medicine

## 2016-05-23 VITALS — BP 138/90 | HR 90 | Temp 97.8°F | Resp 16 | Ht 66.5 in | Wt 190.0 lb

## 2016-05-23 DIAGNOSIS — M5030 Other cervical disc degeneration, unspecified cervical region: Secondary | ICD-10-CM | POA: Diagnosis not present

## 2016-05-23 DIAGNOSIS — M159 Polyosteoarthritis, unspecified: Secondary | ICD-10-CM

## 2016-05-23 DIAGNOSIS — I10 Essential (primary) hypertension: Secondary | ICD-10-CM

## 2016-05-23 DIAGNOSIS — J449 Chronic obstructive pulmonary disease, unspecified: Secondary | ICD-10-CM | POA: Diagnosis not present

## 2016-05-23 DIAGNOSIS — M150 Primary generalized (osteo)arthritis: Secondary | ICD-10-CM | POA: Diagnosis not present

## 2016-05-23 DIAGNOSIS — Z79891 Long term (current) use of opiate analgesic: Secondary | ICD-10-CM | POA: Diagnosis not present

## 2016-05-23 MED ORDER — HYDROCODONE-ACETAMINOPHEN 10-325 MG PO TABS: 1.0000 | ORAL_TABLET | Freq: Four times a day (QID) | ORAL | 0 refills | Status: DC | PRN

## 2016-05-23 NOTE — Telephone Encounter (Signed)
Ok to refill 6 months 

## 2016-05-23 NOTE — Progress Notes (Signed)
Pre visit review using our clinic review tool, if applicable. No additional management support is needed unless otherwise documented below in the visit note. 

## 2016-05-23 NOTE — Progress Notes (Signed)
Subjective:  Patient ID: Felicia Odom, female    DOB: 1956-05-17  Age: 60 y.o. MRN: [id number]  CC: Osteoarthritis   HPI Felicia Odom presents for follow-up on chronic pain management. She needs a refill for hydrocodone. She says it is helping control her joint and chronic neck pain. She has been anxious lately about her upcoming cardiothoracic surgery. She is going to have an anterior mediastinal mass removed. She agrees to go ahead with that. She tells me her blood pressures have been well controlled and she has had no recent episodes of headache, blurred vision, chest pain, shortness of breath, palpitations, edema, or fatigue.  Outpatient Medications Prior to Visit  Medication Sig Dispense Refill  . albuterol (PROAIR HFA) 108 (90 Base) MCG/ACT inhaler Inhale 1-2 puffs into the lungs every 4 (four) hours as needed for wheezing or shortness of breath. 8.5 Inhaler 12  . albuterol (PROVENTIL) (2.5 MG/3ML) 0.083% nebulizer solution Take 3 mLs (2.5 mg total) by nebulization every 6 (six) hours. Dx COPD with bronchitis 75 mL 12  . atorvastatin (LIPITOR) 40 MG tablet Take 1 tablet (40 mg total) by mouth daily. 90 tablet 3  . esomeprazole (NEXIUM) 40 MG capsule TAKE 1 CAPSULE (40 MG TOTAL) BY MOUTH DAILY. 30 capsule 6  . Fluticasone-Salmeterol (ADVAIR DISKUS) 500-50 MCG/DOSE AEPB INHALE 1 PUFF BY MOUTH INTO THE LUNGS EVERY 12 HOURS (RINSE MOUTH AFTERWARDS) 1 each 12  . ALPRAZolam (XANAX) 1 MG tablet Take 1 tablet (1 mg total) by mouth every 6 (six) hours as needed for anxiety or sleep. 120 tablet 5  . HYDROcodone-acetaminophen (NORCO) 10-325 MG tablet Take 1 tablet by mouth every 6 (six) hours as needed (for pain).    Marland Kitchen ibuprofen (ADVIL,MOTRIN) 800 MG tablet TAKE 1 TABLET BY MOUTH TWICE A DAY WITH FOOD AS NEEDED FOR PAIN 60 tablet 5  . promethazine-codeine (PHENERGAN WITH CODEINE) 6.25-10 MG/5ML syrup Take 5 mLs by mouth every 6 (six) hours as needed for cough. 240 mL 0  . 0.9 %   sodium chloride infusion      No facility-administered medications prior to visit.     ROS Review of Systems  Constitutional: Negative.  Negative for appetite change, chills, diaphoresis, fatigue and fever.  HENT: Negative.  Negative for sinus pressure and trouble swallowing.   Eyes: Negative for photophobia and visual disturbance.  Respiratory: Negative for cough, choking, chest tightness, shortness of breath and stridor.   Cardiovascular: Negative.  Negative for chest pain, palpitations and leg swelling.  Gastrointestinal: Negative for abdominal pain, constipation, diarrhea, nausea and vomiting.  Endocrine: Negative.   Genitourinary: Negative.   Musculoskeletal: Positive for arthralgias and neck pain. Negative for back pain, joint swelling, myalgias and neck stiffness.  Skin: Negative.   Allergic/Immunologic: Negative.   Neurological: Negative.  Negative for dizziness and headaches.  Hematological: Negative for adenopathy. Does not bruise/bleed easily.  Psychiatric/Behavioral: Positive for dysphoric mood. Negative for behavioral problems, confusion, hallucinations, self-injury, sleep disturbance and suicidal ideas. The patient is nervous/anxious. The patient is not hyperactive.     Objective:  BP 138/90 (BP Location: Left Arm, Patient Position: Sitting, Cuff Size: Normal)   Pulse 90   Temp 97.8 F (36.6 C) (Oral)   Resp 16   Ht 5' 6.5" (1.689 m)   Wt 190 lb (86.2 kg)   SpO2 97%   BMI 30.21 kg/m   BP Readings from Last 3 Encounters:  05/23/16 138/90  05/05/16 122/70  04/27/16 116/64    Wt Readings from  Last 3 Encounters:  05/23/16 190 lb (86.2 kg)  05/17/16 193 lb (87.5 kg)  05/05/16 195 lb 12.8 oz (88.8 kg)    Physical Exam  Constitutional: She is oriented to person, place, and time. No distress.  HENT:  Mouth/Throat: Oropharynx is clear and moist. No oropharyngeal exudate.  Eyes: Conjunctivae are normal. Right eye exhibits no discharge. Left eye exhibits no  discharge. No scleral icterus.  Neck: Normal range of motion. Neck supple. No JVD present. No tracheal deviation present. No thyromegaly present.  Cardiovascular: Normal rate, regular rhythm, normal heart sounds and intact distal pulses.  Exam reveals no gallop and no friction rub.   No murmur heard. Pulmonary/Chest: Effort normal and breath sounds normal. No stridor. No respiratory distress. She has no wheezes. She has no rales. She exhibits no tenderness.  Abdominal: Soft. Bowel sounds are normal. She exhibits no distension and no mass. There is no tenderness. There is no rebound and no guarding.  Musculoskeletal: Normal range of motion. She exhibits no edema, tenderness or deformity.  Lymphadenopathy:    She has no cervical adenopathy.  Neurological: She is alert and oriented to person, place, and time. She has normal reflexes. She displays normal reflexes. No cranial nerve deficit. She exhibits normal muscle tone. Coordination normal.  Skin: Skin is warm and dry. No rash noted. She is not diaphoretic. No erythema. No pallor.  Psychiatric: Her behavior is normal. Judgment and thought content normal. Her mood appears anxious. Her affect is not angry. Her speech is not rapid and/or pressured, not delayed and not tangential. She is not agitated, not aggressive and not slowed. Cognition and memory are normal. She exhibits a depressed mood. She expresses no homicidal and no suicidal ideation. She expresses no suicidal plans and no homicidal plans.  She is tearful and anxious She is attentive.  Vitals reviewed.   Lab Results  Component Value Date   WBC 9.1 01/21/2015   HGB 15.3 (H) 01/21/2015   HCT 45.4 01/21/2015   PLT 312.0 01/21/2015   GLUCOSE 94 01/21/2015   CHOL 225 (H) 01/21/2015   TRIG 236.0 (H) 01/21/2015   HDL 46.90 01/21/2015   LDLDIRECT 142.0 01/21/2015   ALT 18 01/21/2015   AST 17 01/21/2015   NA 138 01/21/2015   K 4.0 01/21/2015   CL 101 01/21/2015   CREATININE 0.84  01/21/2015   BUN 16 01/21/2015   CO2 29 01/21/2015   TSH 1.73 01/21/2015   HGBA1C 5.7 01/21/2015    No results found.  Assessment & Plan:   Lilliah was seen today for osteoarthritis.  Diagnoses and all orders for this visit:  DDD (degenerative disc disease), cervical -     HYDROcodone-acetaminophen (NORCO) 10-325 MG tablet; Take 1 tablet by mouth every 6 (six) hours as needed (for pain).  Primary osteoarthritis involving multiple joints -     HYDROcodone-acetaminophen (NORCO) 10-325 MG tablet; Take 1 tablet by mouth every 6 (six) hours as needed (for pain).  Essential hypertension- her blood pressure is adequately well-controlled   I have discontinued Ms. Atkison's promethazine-codeine and ibuprofen. I am also having her maintain her esomeprazole, atorvastatin, albuterol, Fluticasone-Salmeterol, albuterol, and HYDROcodone-acetaminophen. We will stop administering sodium chloride.  Meds ordered this encounter  Medications  . HYDROcodone-acetaminophen (NORCO) 10-325 MG tablet    Sig: Take 1 tablet by mouth every 6 (six) hours as needed (for pain).    Dispense:  75 tablet    Refill:  0     Follow-up: Return if symptoms worsen  or fail to improve.  Scarlette Calico, MD

## 2016-05-23 NOTE — Patient Instructions (Signed)

## 2016-05-25 ENCOUNTER — Ambulatory Visit: Payer: Medicare Other | Admitting: Cardiothoracic Surgery

## 2016-05-26 ENCOUNTER — Encounter: Payer: Self-pay | Admitting: Cardiothoracic Surgery

## 2016-05-26 ENCOUNTER — Ambulatory Visit (INDEPENDENT_AMBULATORY_CARE_PROVIDER_SITE_OTHER): Payer: Medicare Other | Admitting: Cardiothoracic Surgery

## 2016-05-26 VITALS — BP 109/75 | HR 97 | Resp 20 | Ht 66.5 in | Wt 190.0 lb

## 2016-05-26 DIAGNOSIS — R918 Other nonspecific abnormal finding of lung field: Secondary | ICD-10-CM
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
## 2016-05-27 ENCOUNTER — Other Ambulatory Visit: Payer: Self-pay | Admitting: *Deleted

## 2016-05-27 DIAGNOSIS — J9859 Other diseases of mediastinum, not elsewhere classified: Secondary | ICD-10-CM

## 2016-05-30 ENCOUNTER — Other Ambulatory Visit: Payer: Self-pay | Admitting: Internal Medicine

## 2016-05-30 ENCOUNTER — Telehealth: Payer: Self-pay | Admitting: Internal Medicine

## 2016-05-30 NOTE — Telephone Encounter (Signed)
Pt aware.

## 2016-05-30 NOTE — Telephone Encounter (Signed)
TJ:  Printed rx for Hydrocodone was given during OV, right?  Pete:  We do not have the results. We will only contact if there is an issue and a medication is discontinued.

## 2016-05-30 NOTE — Telephone Encounter (Signed)
Yes, it was.

## 2016-05-30 NOTE — Telephone Encounter (Signed)
Pt called asking for more information about the UDS that she did on 05/23/16. Pt was wondering if its comes back yet? because she still wait for one of her med that determine by this result.

## 2016-05-31 DIAGNOSIS — N870 Mild cervical dysplasia: Secondary | ICD-10-CM | POA: Diagnosis not present

## 2016-06-07 NOTE — Telephone Encounter (Signed)
Pt is requesting the difference of qty given at the office visit and the usual 30 day supply. #45 is remaining for her 30 day supply. Pt is not in a hurry but she does have surgery scheduled for Wednesday.   [place] site checked for PCP review.

## 2016-06-08 ENCOUNTER — Other Ambulatory Visit: Payer: Self-pay

## 2016-06-08 ENCOUNTER — Ambulatory Visit (HOSPITAL_COMMUNITY)
Admission: RE | Admit: 2016-06-08 | Discharge: 2016-06-08 | Disposition: A | Discharge status: Home / Self Care | Payer: Medicare Other | Source: Ambulatory Visit | Attending: Cardiothoracic Surgery | Admitting: Cardiothoracic Surgery

## 2016-06-08 ENCOUNTER — Telehealth: Payer: Self-pay | Admitting: Internal Medicine

## 2016-06-08 ENCOUNTER — Encounter (HOSPITAL_COMMUNITY): Payer: Self-pay

## 2016-06-08 ENCOUNTER — Encounter (HOSPITAL_COMMUNITY)
Admission: RE | Admit: 2016-06-08 | Discharge: 2016-06-08 | Disposition: A | Discharge status: Home / Self Care | Payer: Medicare Other | Source: Ambulatory Visit | Attending: Cardiothoracic Surgery | Admitting: Cardiothoracic Surgery

## 2016-06-08 DIAGNOSIS — Z86010 Personal history of colonic polyps: Secondary | ICD-10-CM | POA: Diagnosis not present

## 2016-06-08 DIAGNOSIS — J9859 Other diseases of mediastinum, not elsewhere classified: Secondary | ICD-10-CM

## 2016-06-08 DIAGNOSIS — Z01818 Encounter for other preprocedural examination: Secondary | ICD-10-CM

## 2016-06-08 DIAGNOSIS — Z0183 Encounter for blood typing: Secondary | ICD-10-CM | POA: Insufficient documentation

## 2016-06-08 DIAGNOSIS — D150 Benign neoplasm of thymus: Secondary | ICD-10-CM | POA: Diagnosis not present

## 2016-06-08 DIAGNOSIS — M1990 Unspecified osteoarthritis, unspecified site: Secondary | ICD-10-CM | POA: Diagnosis not present

## 2016-06-08 DIAGNOSIS — Z8249 Family history of ischemic heart disease and other diseases of the circulatory system: Secondary | ICD-10-CM | POA: Diagnosis not present

## 2016-06-08 DIAGNOSIS — Z87891 Personal history of nicotine dependence: Secondary | ICD-10-CM | POA: Diagnosis not present

## 2016-06-08 DIAGNOSIS — J449 Chronic obstructive pulmonary disease, unspecified: Secondary | ICD-10-CM | POA: Diagnosis not present

## 2016-06-08 DIAGNOSIS — M549 Dorsalgia, unspecified: Secondary | ICD-10-CM | POA: Diagnosis not present

## 2016-06-08 DIAGNOSIS — K219 Gastro-esophageal reflux disease without esophagitis: Secondary | ICD-10-CM | POA: Diagnosis not present

## 2016-06-08 DIAGNOSIS — M5030 Other cervical disc degeneration, unspecified cervical region: Secondary | ICD-10-CM | POA: Diagnosis not present

## 2016-06-08 DIAGNOSIS — G8929 Other chronic pain: Secondary | ICD-10-CM | POA: Diagnosis not present

## 2016-06-08 DIAGNOSIS — E785 Hyperlipidemia, unspecified: Secondary | ICD-10-CM | POA: Diagnosis not present

## 2016-06-08 DIAGNOSIS — Z01812 Encounter for preprocedural laboratory examination: Secondary | ICD-10-CM | POA: Insufficient documentation

## 2016-06-08 DIAGNOSIS — Z7982 Long term (current) use of aspirin: Secondary | ICD-10-CM | POA: Diagnosis not present

## 2016-06-08 DIAGNOSIS — F429 Obsessive-compulsive disorder, unspecified: Secondary | ICD-10-CM | POA: Diagnosis not present

## 2016-06-08 DIAGNOSIS — R222 Localized swelling, mass and lump, trunk: Secondary | ICD-10-CM | POA: Diagnosis not present

## 2016-06-08 DIAGNOSIS — Z79899 Other long term (current) drug therapy: Secondary | ICD-10-CM | POA: Diagnosis not present

## 2016-06-08 HISTORY — DX: Nocturia: R35.1

## 2016-06-08 HISTORY — DX: Cervicalgia: M54.2

## 2016-06-08 HISTORY — DX: Dorsalgia, unspecified: M54.9

## 2016-06-08 HISTORY — DX: Nausea with vomiting, unspecified: R11.2

## 2016-06-08 HISTORY — DX: Pneumonia, unspecified organism: J18.9

## 2016-06-08 HISTORY — DX: Unspecified chronic bronchitis: J42

## 2016-06-08 HISTORY — DX: Personal history of other medical treatment: Z92.89

## 2016-06-08 HISTORY — DX: Personal history of colonic polyps: Z86.010

## 2016-06-08 HISTORY — DX: Other specified postprocedural states: Z98.890

## 2016-06-08 HISTORY — DX: Effusion, unspecified joint: M25.40

## 2016-06-08 HISTORY — DX: Pain in unspecified joint: M25.50

## 2016-06-08 HISTORY — DX: Neoplasm of unspecified behavior of other specified sites: D49.89

## 2016-06-08 HISTORY — DX: Other chronic pain: G89.29

## 2016-06-08 HISTORY — DX: Unspecified convulsions: R56.9

## 2016-06-08 LAB — BLOOD GAS, ARTERIAL
Acid-Base Excess: 2.1 mmol/L — ABNORMAL HIGH (ref 0.0–2.0)
Bicarbonate: 26.2 mmol/L (ref 20.0–28.0)
Drawn by: 42180
FIO2: 21
O2 Saturation: 95.9 %
Patient temperature: 98.6
pCO2 arterial: 41 mmHg (ref 32.0–48.0)
pH, Arterial: 7.421 (ref 7.350–7.450)
pO2, Arterial: 79.5 mmHg — ABNORMAL LOW (ref 83.0–108.0)

## 2016-06-08 LAB — COMPREHENSIVE METABOLIC PANEL
ALT: 16 U/L (ref 14–54)
AST: 20 U/L (ref 15–41)
Albumin: 3.5 g/dL (ref 3.5–5.0)
Alkaline Phosphatase: 72 U/L (ref 38–126)
Anion gap: 8 (ref 5–15)
BUN: 8 mg/dL (ref 6–20)
CO2: 23 mmol/L (ref 22–32)
Calcium: 8.9 mg/dL (ref 8.9–10.3)
Chloride: 108 mmol/L (ref 101–111)
Creatinine, Ser: 0.64 mg/dL (ref 0.44–1.00)
GFR calc Af Amer: >60 mL/min (ref >60)
GFR calc non Af Amer: >60 mL/min (ref >60)
Glucose, Bld: 149 mg/dL — ABNORMAL HIGH (ref 65–99)
Potassium: 4 mmol/L (ref 3.5–5.1)
Sodium: 139 mmol/L (ref 135–145)
Total Bilirubin: 0.4 mg/dL (ref 0.3–1.2)
Total Protein: 6.4 g/dL — ABNORMAL LOW (ref 6.5–8.1)

## 2016-06-08 LAB — CBC
HCT: 42.4 % (ref 36.0–46.0)
Hemoglobin: 14.3 g/dL (ref 12.0–15.0)
MCH: 28.8 pg (ref 26.0–34.0)
MCHC: 33.7 g/dL (ref 30.0–36.0)
MCV: 85.5 fL (ref 78.0–100.0)
Platelets: 336 10*3/uL (ref 150–400)
RBC: 4.96 MIL/uL (ref 3.87–5.11)
RDW: 13.3 % (ref 11.5–15.5)
WBC: 6.6 10*3/uL (ref 4.0–10.5)

## 2016-06-08 LAB — APTT: aPTT: 35 seconds (ref 24–36)

## 2016-06-08 LAB — TYPE AND SCREEN
ABO/RH(D): A POS
Antibody Screen: NEGATIVE

## 2016-06-08 LAB — URINALYSIS, ROUTINE W REFLEX MICROSCOPIC
Bilirubin Urine: NEGATIVE
Glucose, UA: NEGATIVE mg/dL
Hgb urine dipstick: NEGATIVE
Ketones, ur: NEGATIVE mg/dL
Leukocytes, UA: NEGATIVE
Nitrite: NEGATIVE
Protein, ur: NEGATIVE mg/dL
Specific Gravity, Urine: 1.017 (ref 1.005–1.030)
pH: 5 (ref 5.0–8.0)

## 2016-06-08 LAB — SURGICAL PCR SCREEN
MRSA, PCR: NEGATIVE
Staphylococcus aureus: NEGATIVE

## 2016-06-08 LAB — PROTIME-INR
INR: 0.89
Prothrombin Time: 12 seconds (ref 11.4–15.2)

## 2016-06-08 LAB — ABO/RH: ABO/RH(D): A POS

## 2016-06-08 NOTE — Telephone Encounter (Signed)
Per PCP verbal orders. Pt will be under surgeon care as of Friday and he will take over the rx of any pain medication after the procedure. Informed pt of the same and pt stated understanding.

## 2016-06-08 NOTE — Telephone Encounter (Signed)
Closing note and attaching to previous note.

## 2016-06-08 NOTE — Pre-Procedure Instructions (Signed)
GLENYS BEGEMAN  06/08/2016      [id number] [address], [place] [address] Phone: [phone] Fax: [phone]  [facility] [place] ([place]), [place] - [id number] [place] ([place] [address] Phone: [phone] Fax: [phone]  [facility] ([address]) [place], [place] [address] Phone: [phone] Fax: [phone]    Your procedure is scheduled on Fri, Nov 17 @ 7:30 AM  Report to [facility] at 5:30 AM  Call this number if you have problems the morning of surgery:  [phone]   Remember:  Do not eat food or drink liquids after midnight.  Take these medicines the morning of surgery with A SIP OF WATER Albuterol<Bring Your Inhaler With You>,Xanax(Alprazolam),Nexium(Esomeprazole),Advair, and Pain Pill(if needed)              Stop taking your Ibuprofen,Red Yeast Rice,CO Q10,and Aspirin along with any other Vitamins or Herbal Medications. No Goody's,BC's,Aleve,Advil,Motrin.   Do not wear jewelry, make-up or nail polish.  Do not wear lotions, powders, perfumes, or deoderant.  Do not shave 48 hours prior to surgery.   Do not bring valuables to the hospital.  [facility] is not responsible for any belongings or valuables.  Contacts, dentures or bridgework may not be worn into surgery.  Leave your suitcase in the car.  After surgery it may be brought to your room.  For patients admitted to the hospital, discharge time will be determined by your treatment team.  Patients discharged the day of surgery will not be allowed to drive home.    [facility]  Before surgery, you can play an important role.  Because skin is not sterile, your skin needs to be as free of germs as possible.  You can reduce the number of germs on you skin by washing with CHG  (chlorahexidine gluconate) soap before surgery.  CHG is an antiseptic cleaner which kills germs and bonds with the skin to continue killing germs even after washing.  Please DO NOT use if you have an allergy to CHG or antibacterial soaps.  If your skin becomes reddened/irritated stop using the CHG and inform your nurse when you arrive at Short Stay.  Do not shave (including legs and underarms) for at least 48 hours prior to the first CHG shower.  You may shave your face.  Please follow these instructions carefully:   1.  Shower with CHG Soap the night before surgery and the                                morning of Surgery.  2.  If you choose to wash your hair, wash your hair first as usual with your       normal shampoo.  3.  After you shampoo, rinse your hair and body thoroughly to remove the                      Shampoo.  4.  Use CHG as you would any other liquid soap.  You can apply chg directly       to the skin and wash gently with scrungie or a clean washcloth.  5.  Apply the CHG Soap to your body ONLY FROM THE NECK DOWN.  Do not use on open wounds or open sores.  Avoid contact with your eyes,       ears, mouth and genitals (private parts).  Wash genitals (private parts)       with your normal soap.  6.  Wash thoroughly, paying special attention to the area where your surgery        will be performed.  7.  Thoroughly rinse your body with warm water from the neck down.  8.  DO NOT shower/wash with your normal soap after using and rinsing off       the CHG Soap.  9.  Pat yourself dry with a clean towel.            10.  Wear clean pajamas.            11.  Place clean sheets on your bed the night of your first shower and do not        sleep with pets.  Day of Surgery  Do not apply any lotions/deoderants the morning of surgery.  Please wear clean clothes to the hospital/surgery center.    Please read over the following fact sheets that you were given. Pain Booklet, Coughing and  Deep Breathing, MRSA Information and Surgical Site Infection Prevention

## 2016-06-08 NOTE — OR Nursing (Addendum)
Saw Quitman Livings PA visit in epic from 04-27-16  Medical Md is Dr.Thomas Ronnald Ramp  Echo and Stress report in epic from 05/17/16  Heart cath denies   PFT in epic from 04-27-16

## 2016-06-08 NOTE — Telephone Encounter (Signed)
Patient is requesting to pick up script for hydrocodone for next month b/c she will be having surgery Friday 11/17 and is afraid she will not be able to get to the office to pick up next script.  States has spoken to Dr. Ronnald Ramp about this at last visit. Please follow up in regard with patient at [phone].

## 2016-06-09 NOTE — Progress Notes (Signed)
Anesthesia chart review: Patient is a 60 year old female scheduled for video bronchoscopy, right VATS, mediastinal tumor resection on 06/10/2016 by Dr. Servando Snare.  History includes anterior mediastinal mass, asthma, COPD/chronic bronchitis, allergic rhinitis, OCD, panic disoder, anxiety, GERD, seizures (80's), chronic back pain, post-operative N/V.  - PCP is Dr. Scarlette Calico.  - She was referred for preoperative cardiology evaluation and saw Angelena Form, PA-C. She was ultimately cleared following a recent stress and echo. - Pulmonologist is Dr. Baird Lyons.  Meds include ProAir HFA, albuterol, Xanax, aspirin 81 mg, Nexium, Advair, Norco, red yeast rice extract.  BP 96/72   Pulse 86   Temp 36.4 C (Oral)   Resp 20   Ht 5' 6.5" (1.689 m)   Wt 190 lb (86.2 kg)   SpO2 99%   BMI 30.21 kg/m   EKG 06/08/2016: Normal sinus rhythm, left axis deviation, low voltage QRS, nonspecific ST abnormality.  Echo in 05/17/16: Study Conclusions - Left ventricle: The cavity size was normal. Wall thickness was   normal. Systolic function was normal. The estimated ejection   fraction was in the range of 60% to 65%. Doppler parameters are   consistent with abnormal left ventricular relaxation (grade 1   diastolic dysfunction). Global lateral strain: -20.5% - Aortic valve: Mildly thickened, mildly calcified leaflets. - Tricuspid valve: There was trivial regurgitation.  Nuclear stress test 05/17/2016:  Nuclear stress EF: 56%.  There was no ST segment deviation noted during stress.  Defect 1: There is a small defect of moderate severity present in the apex location.  This is a low risk study.  The left ventricular ejection fraction is normal (55-65%).  Low risk stress nuclear study with possible shifting breast attenuation; cannot R/O mild apical ischemia; remaining vascular territories perfused normally; EF 56 with normal wall motion.  Chest CT 04/21/2016: Impression: 1. Interval growth of  the previously noted well-circumscribed soft tissue attenuation lesion in the anterior mediastinum. This lesion is well separated from the thyroid gland, and demonstrates no internal cystic change and no calcification. There is no associated pleural nodularity or pleural effusion. Overall, the imaging features favor a benign lesion such as a thymoma, however, the continued interval growth is concerning. Biopsy or resection of the lesion is recommended if clinically appropriate. 2. There are calcifications of the aortic valve. Echocardiographic correlation for evaluation of potential valvular dysfunction may be warranted if clinically indicated. 3. 2.1 x 1.8 cm nodule in the right lobe of the thyroid gland appears similar to the prior examination, per report, previously biopsied and benign. 4. Aortic atherosclerosis.  PFTs 04/27/2016: FVC 2.70/75%, FEV1 2.00/72%, FEV1/FVC 0.74/95%, FEF 25-75% 1.44/57%, DLCO unc 20.70/76%.  Preoperative chest x-ray and labs noted.  If no acute changes then I anticipate that she can proceed as planned.  George Hugh [facility] Stay [facility]/Anesthesiology Phone [phone] 06/09/2016 10:19 AM

## 2016-06-10 ENCOUNTER — Inpatient Hospital Stay (HOSPITAL_COMMUNITY): Payer: Medicare Other

## 2016-06-10 ENCOUNTER — Inpatient Hospital Stay (HOSPITAL_COMMUNITY): Payer: Medicare Other | Admitting: Certified Registered"

## 2016-06-10 ENCOUNTER — Inpatient Hospital Stay (HOSPITAL_COMMUNITY): Payer: Medicare Other | Admitting: Vascular Surgery

## 2016-06-10 ENCOUNTER — Encounter (HOSPITAL_COMMUNITY): Payer: Self-pay | Admitting: *Deleted

## 2016-06-10 ENCOUNTER — Encounter (HOSPITAL_COMMUNITY)
Admission: RE | Disposition: A | Discharge status: Home / Self Care | Payer: Self-pay | Source: Ambulatory Visit | Attending: Cardiothoracic Surgery

## 2016-06-10 ENCOUNTER — Inpatient Hospital Stay (HOSPITAL_COMMUNITY)
Admission: RE | Admit: 2016-06-10 | Discharge: 2016-06-14 | DRG: 989 | Disposition: A | Discharge status: Home / Self Care | Payer: Medicare Other | Source: Ambulatory Visit | Attending: Cardiothoracic Surgery | Admitting: Cardiothoracic Surgery

## 2016-06-10 DIAGNOSIS — F429 Obsessive-compulsive disorder, unspecified: Secondary | ICD-10-CM | POA: Diagnosis present

## 2016-06-10 DIAGNOSIS — Z79899 Other long term (current) drug therapy: Secondary | ICD-10-CM | POA: Diagnosis not present

## 2016-06-10 DIAGNOSIS — E785 Hyperlipidemia, unspecified: Secondary | ICD-10-CM | POA: Diagnosis present

## 2016-06-10 DIAGNOSIS — J45909 Unspecified asthma, uncomplicated: Secondary | ICD-10-CM | POA: Diagnosis not present

## 2016-06-10 DIAGNOSIS — Z87891 Personal history of nicotine dependence: Secondary | ICD-10-CM

## 2016-06-10 DIAGNOSIS — R918 Other nonspecific abnormal finding of lung field: Secondary | ICD-10-CM | POA: Diagnosis not present

## 2016-06-10 DIAGNOSIS — Z8249 Family history of ischemic heart disease and other diseases of the circulatory system: Secondary | ICD-10-CM | POA: Diagnosis not present

## 2016-06-10 DIAGNOSIS — M5030 Other cervical disc degeneration, unspecified cervical region: Secondary | ICD-10-CM | POA: Diagnosis present

## 2016-06-10 DIAGNOSIS — J9859 Other diseases of mediastinum, not elsewhere classified: Secondary | ICD-10-CM

## 2016-06-10 DIAGNOSIS — M549 Dorsalgia, unspecified: Secondary | ICD-10-CM | POA: Diagnosis present

## 2016-06-10 DIAGNOSIS — J9811 Atelectasis: Secondary | ICD-10-CM

## 2016-06-10 DIAGNOSIS — Z86010 Personal history of colonic polyps: Secondary | ICD-10-CM

## 2016-06-10 DIAGNOSIS — J449 Chronic obstructive pulmonary disease, unspecified: Secondary | ICD-10-CM | POA: Diagnosis present

## 2016-06-10 DIAGNOSIS — I700 Atherosclerosis of aorta: Secondary | ICD-10-CM | POA: Diagnosis not present

## 2016-06-10 DIAGNOSIS — Z7982 Long term (current) use of aspirin: Secondary | ICD-10-CM

## 2016-06-10 DIAGNOSIS — R222 Localized swelling, mass and lump, trunk: Secondary | ICD-10-CM | POA: Diagnosis not present

## 2016-06-10 DIAGNOSIS — D4989 Neoplasm of unspecified behavior of other specified sites: Secondary | ICD-10-CM | POA: Diagnosis present

## 2016-06-10 DIAGNOSIS — I10 Essential (primary) hypertension: Secondary | ICD-10-CM | POA: Diagnosis not present

## 2016-06-10 DIAGNOSIS — K219 Gastro-esophageal reflux disease without esophagitis: Secondary | ICD-10-CM | POA: Diagnosis present

## 2016-06-10 DIAGNOSIS — F419 Anxiety disorder, unspecified: Secondary | ICD-10-CM | POA: Diagnosis present

## 2016-06-10 DIAGNOSIS — M1990 Unspecified osteoarthritis, unspecified site: Secondary | ICD-10-CM | POA: Diagnosis present

## 2016-06-10 DIAGNOSIS — G8929 Other chronic pain: Secondary | ICD-10-CM | POA: Diagnosis present

## 2016-06-10 DIAGNOSIS — D150 Benign neoplasm of thymus: Secondary | ICD-10-CM | POA: Diagnosis not present

## 2016-06-10 HISTORY — PX: VIDEO ASSISTED THORACOSCOPY: SHX5073

## 2016-06-10 HISTORY — PX: RESECTION OF MEDIASTINAL MASS: SHX6497

## 2016-06-10 HISTORY — PX: VIDEO BRONCHOSCOPY: SHX5072

## 2016-06-10 SURGERY — BRONCHOSCOPY, VIDEO-ASSISTED
Anesthesia: General | Site: Chest | Laterality: Right

## 2016-06-10 MED ORDER — 0.9 % SODIUM CHLORIDE (POUR BTL) OPTIME
TOPICAL | Status: DC | PRN
  Administered 2016-06-10: 1000 mL

## 2016-06-10 MED ORDER — ALBUTEROL SULFATE HFA 108 (90 BASE) MCG/ACT IN AERS
INHALATION_SPRAY | RESPIRATORY_TRACT | Status: DC | PRN
  Administered 2016-06-10: 3 via RESPIRATORY_TRACT

## 2016-06-10 MED ORDER — FENTANYL 40 MCG/ML IV SOLN
INTRAVENOUS | Status: DC
  Administered 2016-06-10: 11:00:00 via INTRAVENOUS
  Administered 2016-06-10: 135 ug via INTRAVENOUS
  Administered 2016-06-10 – 2016-06-11 (×2): 225 ug via INTRAVENOUS
  Administered 2016-06-11: 04:00:00 via INTRAVENOUS
  Administered 2016-06-11: 105 ug via INTRAVENOUS
  Administered 2016-06-11: 60 ug via INTRAVENOUS
  Administered 2016-06-11: 255 ug via INTRAVENOUS
  Administered 2016-06-11: 195 ug via INTRAVENOUS
  Administered 2016-06-11: 255 ug via INTRAVENOUS
  Administered 2016-06-12: 120 ug via INTRAVENOUS
  Administered 2016-06-12: 60 ug via INTRAVENOUS
  Administered 2016-06-12: 07:00:00 via INTRAVENOUS
  Administered 2016-06-12: 285 ug via INTRAVENOUS
  Filled 2016-06-10 (×2): qty 25

## 2016-06-10 MED ORDER — SUGAMMADEX SODIUM 200 MG/2ML IV SOLN
INTRAVENOUS | Status: DC | PRN
  Administered 2016-06-10: 200 mg via INTRAVENOUS

## 2016-06-10 MED ORDER — PROMETHAZINE HCL 25 MG/ML IJ SOLN: 6.2500 mg | INTRAMUSCULAR | Status: DC | PRN

## 2016-06-10 MED ORDER — CEFUROXIME SODIUM 1.5 G IJ SOLR
1.5000 g | INTRAMUSCULAR | Status: AC
  Administered 2016-06-10: 1.5 g via INTRAVENOUS
  Filled 2016-06-10: qty 1.5

## 2016-06-10 MED ORDER — LIDOCAINE HCL (CARDIAC) 20 MG/ML IV SOLN
INTRAVENOUS | Status: DC | PRN
  Administered 2016-06-10: 80 mg via INTRAVENOUS

## 2016-06-10 MED ORDER — ROCURONIUM BROMIDE 10 MG/ML (PF) SYRINGE
PREFILLED_SYRINGE | INTRAVENOUS | Status: AC
  Filled 2016-06-10: qty 10

## 2016-06-10 MED ORDER — EPINEPHRINE PF 1 MG/ML IJ SOLN
INTRAMUSCULAR | Status: AC
  Filled 2016-06-10: qty 1

## 2016-06-10 MED ORDER — FENTANYL 40 MCG/ML IV SOLN
INTRAVENOUS | Status: AC
  Filled 2016-06-10: qty 25

## 2016-06-10 MED ORDER — PNEUMOCOCCAL VAC POLYVALENT 25 MCG/0.5ML IJ INJ: 0.5000 mL | INJECTION | INTRAMUSCULAR | Status: DC | PRN

## 2016-06-10 MED ORDER — METOCLOPRAMIDE HCL 5 MG/ML IJ SOLN
10.0000 mg | Freq: Four times a day (QID) | INTRAMUSCULAR | Status: AC
  Administered 2016-06-10 – 2016-06-11 (×3): 10 mg via INTRAVENOUS
  Filled 2016-06-10 (×3): qty 2

## 2016-06-10 MED ORDER — ALPRAZOLAM 0.5 MG PO TABS
1.0000 mg | ORAL_TABLET | Freq: Four times a day (QID) | ORAL | Status: DC | PRN
  Administered 2016-06-11 – 2016-06-13 (×6): 1 mg via ORAL
  Filled 2016-06-10 (×6): qty 2

## 2016-06-10 MED ORDER — PANTOPRAZOLE SODIUM 40 MG PO TBEC
80.0000 mg | DELAYED_RELEASE_TABLET | Freq: Every day | ORAL | Status: DC
  Administered 2016-06-11 – 2016-06-13 (×3): 80 mg via ORAL
  Filled 2016-06-10 (×3): qty 2

## 2016-06-10 MED ORDER — FENTANYL CITRATE (PF) 100 MCG/2ML IJ SOLN
INTRAMUSCULAR | Status: AC
  Filled 2016-06-10: qty 2

## 2016-06-10 MED ORDER — ORAL CARE MOUTH RINSE
15.0000 mL | Freq: Two times a day (BID) | OROMUCOSAL | Status: DC
  Administered 2016-06-10 – 2016-06-12 (×4): 15 mL via OROMUCOSAL

## 2016-06-10 MED ORDER — TRAMADOL HCL 50 MG PO TABS
50.0000 mg | ORAL_TABLET | Freq: Four times a day (QID) | ORAL | Status: DC | PRN
  Administered 2016-06-11 – 2016-06-14 (×3): 100 mg via ORAL
  Filled 2016-06-10 (×3): qty 2

## 2016-06-10 MED ORDER — DIPHENHYDRAMINE HCL 12.5 MG/5ML PO ELIX: 12.5000 mg | ORAL_SOLUTION | Freq: Four times a day (QID) | ORAL | Status: DC | PRN

## 2016-06-10 MED ORDER — PROPOFOL 10 MG/ML IV BOLUS
INTRAVENOUS | Status: DC | PRN
  Administered 2016-06-10: 30 mg via INTRAVENOUS
  Administered 2016-06-10: 120 mg via INTRAVENOUS

## 2016-06-10 MED ORDER — HYDROMORPHONE HCL 1 MG/ML IJ SOLN
0.2500 mg | INTRAMUSCULAR | Status: DC | PRN
  Administered 2016-06-10 (×4): 0.5 mg via INTRAVENOUS

## 2016-06-10 MED ORDER — BUPIVACAINE HCL (PF) 0.5 % IJ SOLN
INTRAMUSCULAR | Status: DC | PRN
  Administered 2016-06-10: 10 mL

## 2016-06-10 MED ORDER — ASPIRIN EC 81 MG PO TBEC
81.0000 mg | DELAYED_RELEASE_TABLET | Freq: Every day | ORAL | Status: DC
  Administered 2016-06-11 – 2016-06-14 (×4): 81 mg via ORAL
  Filled 2016-06-10 (×4): qty 1

## 2016-06-10 MED ORDER — PHENYLEPHRINE 40 MCG/ML (10ML) SYRINGE FOR IV PUSH (FOR BLOOD PRESSURE SUPPORT)
PREFILLED_SYRINGE | INTRAVENOUS | Status: AC
  Filled 2016-06-10: qty 10

## 2016-06-10 MED ORDER — ACETAMINOPHEN 160 MG/5ML PO SOLN: 1000.0000 mg | Freq: Four times a day (QID) | ORAL | Status: DC

## 2016-06-10 MED ORDER — SODIUM CHLORIDE 0.9% FLUSH: 9.0000 mL | INTRAVENOUS | Status: DC | PRN

## 2016-06-10 MED ORDER — ONDANSETRON HCL 4 MG/2ML IJ SOLN
INTRAMUSCULAR | Status: AC
  Filled 2016-06-10: qty 2

## 2016-06-10 MED ORDER — BUPIVACAINE 0.5 % ON-Q PUMP SINGLE CATH 400 ML
400.0000 mL | INJECTION | Status: AC
  Administered 2016-06-10: 400 mL
  Filled 2016-06-10: qty 400

## 2016-06-10 MED ORDER — MIDAZOLAM HCL 5 MG/5ML IJ SOLN
INTRAMUSCULAR | Status: DC | PRN
  Administered 2016-06-10: 2 mg via INTRAVENOUS

## 2016-06-10 MED ORDER — FENTANYL CITRATE (PF) 100 MCG/2ML IJ SOLN
INTRAMUSCULAR | Status: AC
  Filled 2016-06-10: qty 4

## 2016-06-10 MED ORDER — SENNOSIDES-DOCUSATE SODIUM 8.6-50 MG PO TABS
1.0000 | ORAL_TABLET | Freq: Every day | ORAL | Status: DC
  Administered 2016-06-11 – 2016-06-13 (×3): 1 via ORAL
  Filled 2016-06-10 (×3): qty 1

## 2016-06-10 MED ORDER — HYDROMORPHONE HCL 1 MG/ML IJ SOLN
INTRAMUSCULAR | Status: DC | PRN
  Administered 2016-06-10: 1 mg via INTRAVENOUS

## 2016-06-10 MED ORDER — MIDAZOLAM HCL 2 MG/2ML IJ SOLN
INTRAMUSCULAR | Status: AC
  Filled 2016-06-10: qty 2

## 2016-06-10 MED ORDER — LEVALBUTEROL HCL 0.63 MG/3ML IN NEBU
0.6300 mg | INHALATION_SOLUTION | Freq: Four times a day (QID) | RESPIRATORY_TRACT | Status: DC
  Administered 2016-06-10 – 2016-06-11 (×4): 0.63 mg via RESPIRATORY_TRACT
  Filled 2016-06-10 (×4): qty 3

## 2016-06-10 MED ORDER — DEXTROSE-NACL 5-0.45 % IV SOLN
INTRAVENOUS | Status: DC
  Administered 2016-06-10 – 2016-06-11 (×2): via INTRAVENOUS

## 2016-06-10 MED ORDER — CEFUROXIME SODIUM 1.5 G IJ SOLR
1.5000 g | Freq: Two times a day (BID) | INTRAMUSCULAR | Status: AC
  Administered 2016-06-10 – 2016-06-11 (×2): 1.5 g via INTRAVENOUS
  Filled 2016-06-10 (×2): qty 1.5

## 2016-06-10 MED ORDER — LIDOCAINE 2% (20 MG/ML) 5 ML SYRINGE
INTRAMUSCULAR | Status: AC
  Filled 2016-06-10: qty 5

## 2016-06-10 MED ORDER — ACETAMINOPHEN 500 MG PO TABS
1000.0000 mg | ORAL_TABLET | Freq: Four times a day (QID) | ORAL | Status: DC
  Administered 2016-06-10 – 2016-06-14 (×7): 1000 mg via ORAL
  Filled 2016-06-10 (×6): qty 2

## 2016-06-10 MED ORDER — PROPOFOL 10 MG/ML IV BOLUS
INTRAVENOUS | Status: AC
  Filled 2016-06-10: qty 20

## 2016-06-10 MED ORDER — HYDROMORPHONE HCL 2 MG/ML IJ SOLN
INTRAMUSCULAR | Status: AC
  Filled 2016-06-10: qty 1

## 2016-06-10 MED ORDER — ONDANSETRON HCL 4 MG/2ML IJ SOLN
4.0000 mg | Freq: Four times a day (QID) | INTRAMUSCULAR | Status: DC | PRN
  Administered 2016-06-11: 4 mg via INTRAVENOUS

## 2016-06-10 MED ORDER — NALOXONE HCL 0.4 MG/ML IJ SOLN: 0.4000 mg | INTRAMUSCULAR | Status: DC | PRN

## 2016-06-10 MED ORDER — HYDROCODONE-ACETAMINOPHEN 10-325 MG PO TABS
1.0000 | ORAL_TABLET | ORAL | Status: DC | PRN
  Administered 2016-06-10 – 2016-06-14 (×12): 1 via ORAL
  Filled 2016-06-10 (×13): qty 1

## 2016-06-10 MED ORDER — HYDROMORPHONE HCL 1 MG/ML IJ SOLN
INTRAMUSCULAR | Status: AC
  Filled 2016-06-10: qty 1

## 2016-06-10 MED ORDER — ONDANSETRON HCL 4 MG/2ML IJ SOLN
4.0000 mg | Freq: Four times a day (QID) | INTRAMUSCULAR | Status: DC | PRN
  Filled 2016-06-10: qty 2

## 2016-06-10 MED ORDER — MOMETASONE FURO-FORMOTEROL FUM 200-5 MCG/ACT IN AERO
2.0000 | INHALATION_SPRAY | Freq: Two times a day (BID) | RESPIRATORY_TRACT | Status: DC
  Administered 2016-06-10 – 2016-06-14 (×3): 2 via RESPIRATORY_TRACT
  Filled 2016-06-10: qty 8.8

## 2016-06-10 MED ORDER — BUPIVACAINE HCL (PF) 0.5 % IJ SOLN
INTRAMUSCULAR | Status: AC
  Filled 2016-06-10: qty 10

## 2016-06-10 MED ORDER — POTASSIUM CHLORIDE 10 MEQ/50ML IV SOLN
10.0000 meq | Freq: Every day | INTRAVENOUS | Status: DC | PRN
  Filled 2016-06-10: qty 50

## 2016-06-10 MED ORDER — ALBUTEROL SULFATE (2.5 MG/3ML) 0.083% IN NEBU: 2.5000 mg | INHALATION_SOLUTION | RESPIRATORY_TRACT | Status: DC | PRN

## 2016-06-10 MED ORDER — FENTANYL CITRATE (PF) 100 MCG/2ML IJ SOLN
INTRAMUSCULAR | Status: DC | PRN
  Administered 2016-06-10 (×4): 50 ug via INTRAVENOUS
  Administered 2016-06-10: 100 ug via INTRAVENOUS

## 2016-06-10 MED ORDER — DIPHENHYDRAMINE HCL 50 MG/ML IJ SOLN: 12.5000 mg | Freq: Four times a day (QID) | INTRAMUSCULAR | Status: DC | PRN

## 2016-06-10 MED ORDER — ROCURONIUM BROMIDE 100 MG/10ML IV SOLN
INTRAVENOUS | Status: DC | PRN
  Administered 2016-06-10: 40 mg via INTRAVENOUS
  Administered 2016-06-10 (×2): 20 mg via INTRAVENOUS
  Administered 2016-06-10: 40 mg via INTRAVENOUS

## 2016-06-10 MED ORDER — BUPIVACAINE ON-Q PAIN PUMP (FOR ORDER SET NO CHG)
INJECTION | Status: DC
  Filled 2016-06-10: qty 1

## 2016-06-10 MED ORDER — BISACODYL 5 MG PO TBEC
10.0000 mg | DELAYED_RELEASE_TABLET | Freq: Every day | ORAL | Status: DC
  Administered 2016-06-11 – 2016-06-14 (×4): 10 mg via ORAL
  Filled 2016-06-10 (×4): qty 2

## 2016-06-10 MED ORDER — PHENYLEPHRINE HCL 10 MG/ML IJ SOLN
INTRAVENOUS | Status: DC | PRN
  Administered 2016-06-10: 25 ug/min via INTRAVENOUS

## 2016-06-10 MED ORDER — LACTATED RINGERS IV SOLN
INTRAVENOUS | Status: DC | PRN
  Administered 2016-06-10: 06:00:00 via INTRAVENOUS

## 2016-06-10 MED ORDER — ONDANSETRON HCL 4 MG/2ML IJ SOLN
INTRAMUSCULAR | Status: DC | PRN
  Administered 2016-06-10: 4 mg via INTRAVENOUS

## 2016-06-10 SURGICAL SUPPLY — 89 items
ADH SKN CLS APL DERMABOND .7 (GAUZE/BANDAGES/DRESSINGS) ×3
ADH SKN CLS LQ APL DERMABOND (GAUZE/BANDAGES/DRESSINGS) ×3
APL SRG 22X2 LUM MLBL SLNT (VASCULAR PRODUCTS)
APL SRG 7X2 LUM MLBL SLNT (VASCULAR PRODUCTS)
APPLICATOR TIP COSEAL (VASCULAR PRODUCTS) IMPLANT
APPLICATOR TIP EXT COSEAL (VASCULAR PRODUCTS) IMPLANT
BLADE SURG 11 STRL SS (BLADE) IMPLANT
BRUSH CYTOL CELLEBRITY 1.5X140 (MISCELLANEOUS) IMPLANT
CANISTER SUCTION 2500CC (MISCELLANEOUS) ×4 IMPLANT
CATH KIT ON Q 5IN SLV (PAIN MANAGEMENT) IMPLANT
CATH KIT ON-Q SILVERSOAK 7.5 (CATHETERS) IMPLANT
CATH KIT ON-Q SILVERSOAK 7.5IN (CATHETERS) ×4 IMPLANT
CATH THORACIC 28FR (CATHETERS) IMPLANT
CATH THORACIC 36FR (CATHETERS) IMPLANT
CATH THORACIC 36FR RT ANG (CATHETERS) IMPLANT
CLEANER TIP ELECTROSURG 2X2 (MISCELLANEOUS) ×4 IMPLANT
CLIP TI MEDIUM 6 (CLIP) IMPLANT
CONN ST 1/4X3/8  BEN (MISCELLANEOUS) ×2
CONN ST 1/4X3/8 BEN (MISCELLANEOUS) IMPLANT
CONT SPEC 4OZ CLIKSEAL STRL BL (MISCELLANEOUS) ×8 IMPLANT
COVER TABLE BACK 60X90 (DRAPES) ×4 IMPLANT
DERMABOND ADHESIVE PROPEN (GAUZE/BANDAGES/DRESSINGS) ×1
DERMABOND ADVANCED (GAUZE/BANDAGES/DRESSINGS) ×1
DERMABOND ADVANCED .7 DNX12 (GAUZE/BANDAGES/DRESSINGS) IMPLANT
DERMABOND ADVANCED .7 DNX6 (GAUZE/BANDAGES/DRESSINGS) IMPLANT
DRAIN CHANNEL 32F RND 10.7 FF (WOUND CARE) ×2 IMPLANT
DRAPE LAPAROSCOPIC ABDOMINAL (DRAPES) ×4 IMPLANT
DRAPE WARM FLUID 44X44 (DRAPE) ×4 IMPLANT
DRILL BIT 7/64X5 (BIT) IMPLANT
ELECT BLADE 4.0 EZ CLEAN MEGAD (MISCELLANEOUS) ×4
ELECT BLADE 6.5 EXT (BLADE) ×1 IMPLANT
ELECT REM PT RETURN 9FT ADLT (ELECTROSURGICAL) ×4
ELECTRODE BLDE 4.0 EZ CLN MEGD (MISCELLANEOUS) ×3 IMPLANT
ELECTRODE REM PT RTRN 9FT ADLT (ELECTROSURGICAL) ×3 IMPLANT
FORCEPS BIOP RJ4 1.8 (CUTTING FORCEPS) IMPLANT
GAUZE SPONGE 4X4 12PLY STRL (GAUZE/BANDAGES/DRESSINGS) ×4 IMPLANT
GLOVE BIO SURGEON STRL SZ 6.5 (GLOVE) ×9 IMPLANT
GLOVE BIOGEL PI IND STRL 6.5 (GLOVE) IMPLANT
GLOVE BIOGEL PI INDICATOR 6.5 (GLOVE) ×1
GOWN STRL REUS W/ TWL LRG LVL3 (GOWN DISPOSABLE) ×9 IMPLANT
GOWN STRL REUS W/TWL LRG LVL3 (GOWN DISPOSABLE) ×16
KIT BASIN OR (CUSTOM PROCEDURE TRAY) ×4 IMPLANT
KIT CLEAN ENDO COMPLIANCE (KITS) ×4 IMPLANT
KIT ROOM TURNOVER OR (KITS) ×4 IMPLANT
KIT SUCTION CATH 14FR (SUCTIONS) ×4 IMPLANT
MARKER SKIN DUAL TIP RULER LAB (MISCELLANEOUS) IMPLANT
NDL BIOPSY TRANSBRONCH 21G (NEEDLE) IMPLANT
NEEDLE BIOPSY TRANSBRONCH 21G (NEEDLE) IMPLANT
NS IRRIG 1000ML POUR BTL (IV SOLUTION) ×8 IMPLANT
OIL SILICONE PENTAX (PARTS (SERVICE/REPAIRS)) ×4 IMPLANT
PACK CHEST (CUSTOM PROCEDURE TRAY) ×4 IMPLANT
PAD ARMBOARD 7.5X6 YLW CONV (MISCELLANEOUS) ×8 IMPLANT
PASSER SUT SWANSON 36MM LOOP (INSTRUMENTS) ×1 IMPLANT
SEALANT PROGEL (MISCELLANEOUS) IMPLANT
SEALANT SURG COSEAL 4ML (VASCULAR PRODUCTS) IMPLANT
SEALANT SURG COSEAL 8ML (VASCULAR PRODUCTS) IMPLANT
SOLUTION ANTI FOG 6CC (MISCELLANEOUS) ×4 IMPLANT
SPONGE GAUZE 4X4 12PLY STER LF (GAUZE/BANDAGES/DRESSINGS) ×1 IMPLANT
SPONGE INTESTINAL PEANUT (DISPOSABLE) ×1 IMPLANT
SUT PROLENE 3 0 SH DA (SUTURE) IMPLANT
SUT PROLENE 4 0 RB 1 (SUTURE)
SUT PROLENE 4-0 RB1 .5 CRCL 36 (SUTURE) IMPLANT
SUT SILK  1 MH (SUTURE) ×4
SUT SILK 1 MH (SUTURE) ×12 IMPLANT
SUT SILK 2 0 SH (SUTURE) ×2 IMPLANT
SUT SILK 2 0SH CR/8 30 (SUTURE) IMPLANT
SUT SILK 3 0SH CR/8 30 (SUTURE) IMPLANT
SUT VIC AB 1 CTX 18 (SUTURE) ×1 IMPLANT
SUT VIC AB 1 CTX 36 (SUTURE) ×4
SUT VIC AB 1 CTX36XBRD ANBCTR (SUTURE) IMPLANT
SUT VIC AB 2-0 CTX 36 (SUTURE) ×1 IMPLANT
SUT VIC AB 3-0 X1 27 (SUTURE) ×1 IMPLANT
SUT VICRYL 2 TP 1 (SUTURE) ×1 IMPLANT
SWAB COLLECTION DEVICE MRSA (MISCELLANEOUS) IMPLANT
SYR 20ML ECCENTRIC (SYRINGE) ×4 IMPLANT
SYSTEM SAHARA CHEST DRAIN ATS (WOUND CARE) ×4 IMPLANT
TAPE CLOTH 4X10 WHT NS (GAUZE/BANDAGES/DRESSINGS) ×4 IMPLANT
TAPE CLOTH SURG 4X10 WHT LF (GAUZE/BANDAGES/DRESSINGS) ×1 IMPLANT
TIP APPLICATOR SPRAY EXTEND 16 (VASCULAR PRODUCTS) IMPLANT
TOWEL OR 17X24 6PK STRL BLUE (TOWEL DISPOSABLE) ×4 IMPLANT
TOWEL OR 17X26 10 PK STRL BLUE (TOWEL DISPOSABLE) ×6 IMPLANT
TRAP SPECIMEN MUCOUS 40CC (MISCELLANEOUS) IMPLANT
TRAY FOLEY CATH SILVER 16FR LF (SET/KITS/TRAYS/PACK) ×4 IMPLANT
TROCAR BLADELESS 11MM (ENDOMECHANICALS) ×1 IMPLANT
TROCAR BLADELESS 12MM (ENDOMECHANICALS) IMPLANT
TUBE ANAEROBIC SPECIMEN COL (MISCELLANEOUS) IMPLANT
TUBE CONNECTING 20X1/4 (TUBING) ×4 IMPLANT
TUNNELER SHEATH ON-Q 11GX8 DSP (PAIN MANAGEMENT) ×1 IMPLANT
WATER STERILE IRR 1000ML POUR (IV SOLUTION) ×8 IMPLANT

## 2016-06-10 NOTE — Anesthesia Procedure Notes (Signed)
Procedure Name: Intubation Date/Time: 06/10/2016 7:59 AM Performed by: Manuela Schwartz B Pre-anesthesia Checklist: Patient identified, Emergency Drugs available, Suction available, Patient being monitored and Timeout performed Patient Re-evaluated:Patient Re-evaluated prior to inductionOxygen Delivery Method: Circle System Utilized Preoxygenation: Pre-oxygenation with 100% oxygen Laryngoscope Size: Mac and 3 Grade View: Grade I Tube type: Oral Endobronchial tube: Double lumen EBT, Left, EBT position confirmed by auscultation and EBT position confirmed by fiberoptic bronchoscope and 37 Fr Number of attempts: 1 Airway Equipment and Method: Stylet Placement Confirmation: ETT inserted through vocal cords under direct vision,  positive ETCO2 and breath sounds checked- equal and bilateral Secured at: 28 cm Tube secured with: Tape Dental Injury: Teeth and Oropharynx as per pre-operative assessment  Comments: Intubation performed by [facility]

## 2016-06-10 NOTE — Transfer of Care (Signed)
Immediate Anesthesia Transfer of Care Note  Patient: Felicia Odom  Procedure(s) Performed: Procedure(s): VIDEO BRONCHOSCOPY (N/A) VIDEO ASSISTED THORACOSCOPY WITH PLACEMENT OF ON Q TUNNELER (Right) RESECTION OF MEDIASTINAL MASS (N/A)  Patient Location: PACU  Anesthesia Type:General  Level of Consciousness: awake, alert  and patient cooperative  Airway & Oxygen Therapy: Patient Spontanous Breathing and Patient connected to nasal cannula oxygen  Post-op Assessment: Report given to RN and Post -op Vital signs reviewed and stable  Post vital signs: Reviewed and stable  Last Vitals:  Vitals:   06/10/16 0554  BP: (!) 130/110  Pulse: 70  Resp: 20  Temp: 36.6 C    Last Pain:  Vitals:   06/10/16 0554  TempSrc: Oral      Patients Stated Pain Goal: 2 (0000000 [id number])  Complications: No apparent anesthesia complications
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
## 2016-06-10 NOTE — Anesthesia Procedure Notes (Signed)
Procedure Name: Intubation Date/Time: 06/10/2016 7:46 AM Performed by: Manuela Schwartz B Pre-anesthesia Checklist: Patient identified, Emergency Drugs available, Suction available, Patient being monitored and Timeout performed Patient Re-evaluated:Patient Re-evaluated prior to inductionOxygen Delivery Method: Circle System Utilized Preoxygenation: Pre-oxygenation with 100% oxygen Intubation Type: IV induction Ventilation: Mask ventilation without difficulty Laryngoscope Size: Mac and 3 Grade View: Grade I Tube type: Oral Tube size: 8.5 mm Number of attempts: 2 Airway Equipment and Method: Stylet Placement Confirmation: ETT inserted through vocal cords under direct vision,  positive ETCO2 and breath sounds checked- equal and bilateral Secured at: 23 cm Tube secured with: Tape Dental Injury: Teeth and Oropharynx as per pre-operative assessment  Comments: DL with MAC 3 blade by SRNA with grade 2b view but unable to pass ETT through cords. DL with Sabra Heck 2 by [facility] with grade 1 view but ETT passed unsuccessfully. DL with MAC 3 blade by CRNA with grade 1 view and ETT passed successfully.

## 2016-06-10 NOTE — Brief Op Note (Addendum)
06/10/2016  10:36 AM  PATIENT:  Felicia Odom  60 y.o. female  PRE-OPERATIVE DIAGNOSIS:  MEDIASTINAL MASS  POST-OPERATIVE DIAGNOSIS:  THYMOMA  PROCEDURE:  Procedure(s):  VIDEO BRONCHOSCOPY (N/A)  VIDEO ASSISTED THORACOSCOPY/MINI THORACOTOMY -Resection Anterior Mediastinal Mass -Placement of ON-Q Anesthetic Pain Catheter  SURGEON:  Surgeon(s) and Role:    * Grace Isaac, MD - Primary  PHYSICIAN ASSISTANT: Erin Barrett PA-C  ANESTHESIA:   general  EBL:  Total I/O In: -  Out: 660 [Urine:560; Blood:100]  BLOOD ADMINISTERED:none  DRAINS: 32 Blake Drain x 2   LOCAL MEDICATIONS USED:  MARCAINE     SPECIMEN:  Source of Specimen:  Mediastinal mass  DISPOSITION OF SPECIMEN:  PATHOLOGY  COUNTS:  YES  TOURNIQUET:  * No tourniquets in log *  DICTATION: .Dragon Dictation  PLAN OF CARE: Admit to inpatient   PATIENT DISPOSITION:  PACU - hemodynamically stable.   Delay start of Pharmacological VTE agent (>24hrs) due to surgical blood loss or risk of bleeding: yes

## 2016-06-10 NOTE — Progress Notes (Signed)
[facility] BRIEF SICU PROGRESS NOTE  Day of Surgery  S/P Procedure(s) (LRB): VIDEO BRONCHOSCOPY (N/A) VIDEO ASSISTED THORACOSCOPY WITH PLACEMENT OF ON Q TUNNELER (Right) RESECTION OF MEDIASTINAL MASS (N/A)   No complaints.  Somewhat sleepy but easily arousable.  Good pain control O2 sats 98% NSR w/ stable BP Minimal chest tube output  Plan: Continue routine early postop  Rexene Alberts, MD 06/10/2016 6:01 PM

## 2016-06-10 NOTE — Anesthesia Preprocedure Evaluation (Addendum)
Anesthesia Evaluation  Patient identified by MRN, date of birth, ID band Patient awake    Reviewed: Allergy & Precautions, NPO status , Patient's Chart, lab work & pertinent test results, reviewed documented beta blocker date and time   History of Anesthesia Complications (+) PONV  Airway Mallampati: II  TM Distance: <3 FB Neck ROM: Full    Dental  (+) Partial Lower, Partial Upper, Dental Advisory Given   Pulmonary asthma , COPD, former smoker,    breath sounds clear to auscultation       Cardiovascular hypertension, + Peripheral Vascular Disease   Rhythm:Regular Rate:Normal     Neuro/Psych Seizures -,     GI/Hepatic GERD  ,  Endo/Other  negative endocrine ROS  Renal/GU negative Renal ROS     Musculoskeletal  (+) Arthritis ,   Abdominal   Peds  Hematology negative hematology ROS (+)   Anesthesia Other Findings   Reproductive/Obstetrics                           Anesthesia Physical Anesthesia Plan  ASA: III  Anesthesia Plan: General   Post-op Pain Management:    Induction: Intravenous  Airway Management Planned: Double Lumen EBT and Oral ETT  Additional Equipment: Arterial line  Intra-op Plan:   Post-operative Plan: Extubation in OR  Informed Consent: I have reviewed the patients History and Physical, chart, labs and discussed the procedure including the risks, benefits and alternatives for the proposed anesthesia with the patient or authorized representative who has indicated his/her understanding and acceptance.     Plan Discussed with:   Anesthesia Plan Comments:        Anesthesia Quick Evaluation

## 2016-06-11 ENCOUNTER — Other Ambulatory Visit: Payer: Self-pay | Admitting: Internal Medicine

## 2016-06-11 ENCOUNTER — Inpatient Hospital Stay (HOSPITAL_COMMUNITY): Payer: Medicare Other

## 2016-06-11 LAB — BASIC METABOLIC PANEL
Anion gap: 6 (ref 5–15)
BUN: 5 mg/dL — AB (ref 6–20)
CHLORIDE: 103 mmol/L (ref 101–111)
CO2: 28 mmol/L (ref 22–32)
Calcium: 7.9 mg/dL — ABNORMAL LOW (ref 8.9–10.3)
Creatinine, Ser: 0.55 mg/dL (ref 0.44–1.00)
GFR calc Af Amer: >60 mL/min (ref >60)
GFR calc non Af Amer: >60 mL/min (ref >60)
GLUCOSE: 134 mg/dL — AB (ref 65–99)
POTASSIUM: 3.8 mmol/L (ref 3.5–5.1)
SODIUM: 137 mmol/L (ref 135–145)

## 2016-06-11 LAB — POCT I-STAT 3, ART BLOOD GAS (G3+)
Acid-Base Excess: 3 mmol/L — ABNORMAL HIGH (ref 0.0–2.0)
Bicarbonate: 29.9 mmol/L — ABNORMAL HIGH (ref 20.0–28.0)
O2 Saturation: 94 %
Patient temperature: 98.6
TCO2: 32 mmol/L (ref 0–100)
pCO2 arterial: 54.9 mmHg — ABNORMAL HIGH (ref 32.0–48.0)
pH, Arterial: 7.344 — ABNORMAL LOW (ref 7.350–7.450)
pO2, Arterial: 76 mmHg — ABNORMAL LOW (ref 83.0–108.0)

## 2016-06-11 LAB — CBC
HEMATOCRIT: 38 % (ref 36.0–46.0)
HEMOGLOBIN: 12.4 g/dL (ref 12.0–15.0)
MCH: 28.7 pg (ref 26.0–34.0)
MCHC: 32.6 g/dL (ref 30.0–36.0)
MCV: 88 fL (ref 78.0–100.0)
Platelets: 231 10*3/uL (ref 150–400)
RBC: 4.32 MIL/uL (ref 3.87–5.11)
RDW: 13.4 % (ref 11.5–15.5)
WBC: 7.6 10*3/uL (ref 4.0–10.5)

## 2016-06-11 MED ORDER — METOPROLOL TARTRATE 12.5 MG HALF TABLET
12.5000 mg | ORAL_TABLET | Freq: Two times a day (BID) | ORAL | Status: DC
  Administered 2016-06-11 – 2016-06-14 (×6): 12.5 mg via ORAL
  Filled 2016-06-11 (×6): qty 1

## 2016-06-11 MED ORDER — KETOROLAC TROMETHAMINE 15 MG/ML IJ SOLN
15.0000 mg | Freq: Three times a day (TID) | INTRAMUSCULAR | Status: AC | PRN
  Administered 2016-06-11 – 2016-06-13 (×4): 15 mg via INTRAVENOUS
  Filled 2016-06-11 (×4): qty 1

## 2016-06-11 MED ORDER — METOPROLOL TARTRATE 5 MG/5ML IV SOLN
INTRAVENOUS | Status: AC
  Filled 2016-06-11: qty 5

## 2016-06-11 MED ORDER — METOPROLOL TARTRATE 5 MG/5ML IV SOLN
5.0000 mg | Freq: Four times a day (QID) | INTRAVENOUS | Status: DC | PRN
  Administered 2016-06-11: 5 mg via INTRAVENOUS

## 2016-06-11 MED ORDER — ENOXAPARIN SODIUM 40 MG/0.4ML ~~LOC~~ SOLN
40.0000 mg | SUBCUTANEOUS | Status: DC
  Administered 2016-06-11 – 2016-06-13 (×3): 40 mg via SUBCUTANEOUS
  Filled 2016-06-11 (×4): qty 0.4

## 2016-06-11 MED ORDER — LEVALBUTEROL HCL 0.63 MG/3ML IN NEBU
0.6300 mg | INHALATION_SOLUTION | Freq: Three times a day (TID) | RESPIRATORY_TRACT | Status: DC
  Administered 2016-06-11 – 2016-06-14 (×9): 0.63 mg via RESPIRATORY_TRACT
  Filled 2016-06-11 (×9): qty 3

## 2016-06-11 NOTE — Progress Notes (Addendum)
      [address]       [place] [address]             [phone]      1 Day Post-Op Procedure(s) (LRB): VIDEO BRONCHOSCOPY (N/A) VIDEO ASSISTED THORACOSCOPY WITH PLACEMENT OF ON Q TUNNELER (Right) RESECTION OF MEDIASTINAL MASS (N/A)   Subjective:  Patient complains of pain and fatigue this morning.  She states that she did not get much sleep last night due to setting off the IV alarms.  She also is having short bursts of Sinus Tach/A. Fib  Objective: Vital signs in last 24 hours: Temp:  [97 F (36.1 C)-98.7 F (37.1 C)] 98.7 F (37.1 C) (11/18 0828) Pulse Rate:  [65-88] 88 (11/18 0700) Cardiac Rhythm: Normal sinus rhythm (11/17 2100) Resp:  [11-20] 20 (11/18 0700) BP: (80-144)/(49-82) 90/51 (11/18 0500) SpO2:  [95 %-100 %] 100 % (11/18 0700) Arterial Line BP: (62-175)/(43-99) 94/43 (11/18 0000) Weight:  [190 lb 0.6 oz (86.2 kg)-198 lb 6.6 oz (90 kg)] 198 lb 6.6 oz (90 kg) (11/18 0600)  Intake/Output from previous day: 11/17 0701 - 11/18 0700 In: 3700 [P.O.:800; I.V.:2850; IV Piggyback:50] Out: 2975 [Urine:2620; Blood:100; Chest Tube:255] Intake/Output this shift: Total I/O In: -  Out: 225 [Urine:165; Chest Tube:60]  General appearance: alert, cooperative and no distress Heart: regular rate and rhythm Lungs: clear to auscultation bilaterally Abdomen: soft, non-tender; bowel sounds normal; no masses,  no organomegaly Extremities: extremities normal, atraumatic, no cyanosis or edema Wound: clean and dry  Lab Results:  Recent Labs  06/08/16 1331 06/11/16 0235  WBC 6.6 7.6  HGB 14.3 12.4  HCT 42.4 38.0  PLT 336 231   BMET:  Recent Labs  06/08/16 1331 06/11/16 0235  NA 139 137  K 4.0 3.8  CL 108 103  CO2 23 28  GLUCOSE 149* 134*  BUN 8 5*  CREATININE 0.64 0.55  CALCIUM 8.9 7.9*    PT/INR:  Recent Labs  06/08/16 1331  LABPROT 12.0  INR 0.89   ABG    Component Value Date/Time   PHART 7.344 (L) 06/11/2016 0349   HCO3 29.9  (H) 06/11/2016 0349   TCO2 32 06/11/2016 0349   O2SAT 94.0 06/11/2016 0349   CBG (last 3)  No results for input(s): GLUCAP in the last 72 hours.  Assessment/Plan: S/P Procedure(s) (LRB): VIDEO BRONCHOSCOPY (N/A) VIDEO ASSISTED THORACOSCOPY WITH PLACEMENT OF ON Q TUNNELER (Right) RESECTION OF MEDIASTINAL MASS (N/A)  1. Chest tube- no air leak present, 315 cc output since surgery- will transition chest tubes to water seal 2. CV- bursts of Sinus Tach/A. Fib, currently NSR in the 80s, BP is labile 90-144- will order prn Lopressor, start oral Lopressor at 12.5 mg BID with parameters in place for Hypotension.. May need Amiodarone if continues to have A. FIb 3. D/C Arterial Line 4. Decrease IV Fluids 5. Start Lovenox for DVT prophylaxis 6. Pain control- not much relief with PCA, Norco which is chronic home med... Will try low dose Toradol prn 7. Dispo- patient stable, chest tubes to water seal, Lopressor for Sinus Tach/A.Fib.. May benefit from Amiodarone   LOS: 1 day    BARRETT, ERIN 06/11/2016   I have seen and examined the patient and agree with the assessment and plan as outlined.  Rexene Alberts, MD 06/11/2016 10:31 AM

## 2016-06-11 NOTE — Progress Notes (Signed)
Patient having bursts of SVT/Afib with rates ranging from the 80s-150s. Dr. Roxy Manns notified and will assess on morning rounds.  Felicia Odom

## 2016-06-12 ENCOUNTER — Inpatient Hospital Stay (HOSPITAL_COMMUNITY): Payer: Medicare Other

## 2016-06-12 LAB — CBC
HEMATOCRIT: 38.4 % (ref 36.0–46.0)
Hemoglobin: 12.1 g/dL (ref 12.0–15.0)
MCH: 28.1 pg (ref 26.0–34.0)
MCHC: 31.5 g/dL (ref 30.0–36.0)
MCV: 89.3 fL (ref 78.0–100.0)
Platelets: 216 10*3/uL (ref 150–400)
RBC: 4.3 MIL/uL (ref 3.87–5.11)
RDW: 13.8 % (ref 11.5–15.5)
WBC: 7.2 10*3/uL (ref 4.0–10.5)

## 2016-06-12 LAB — COMPREHENSIVE METABOLIC PANEL
ALT: 16 U/L (ref 14–54)
AST: 20 U/L (ref 15–41)
Albumin: 2.7 g/dL — ABNORMAL LOW (ref 3.5–5.0)
Alkaline Phosphatase: 52 U/L (ref 38–126)
Anion gap: 6 (ref 5–15)
BILIRUBIN TOTAL: 0.6 mg/dL (ref 0.3–1.2)
CO2: 27 mmol/L (ref 22–32)
CREATININE: 0.57 mg/dL (ref 0.44–1.00)
Calcium: 7.9 mg/dL — ABNORMAL LOW (ref 8.9–10.3)
Chloride: 102 mmol/L (ref 101–111)
GFR calc Af Amer: >60 mL/min (ref >60)
Glucose, Bld: 138 mg/dL — ABNORMAL HIGH (ref 65–99)
Potassium: 4.2 mmol/L (ref 3.5–5.1)
Sodium: 135 mmol/L (ref 135–145)
TOTAL PROTEIN: 5.6 g/dL — AB (ref 6.5–8.1)

## 2016-06-12 MED ORDER — POTASSIUM CHLORIDE CRYS ER 20 MEQ PO TBCR
20.0000 meq | EXTENDED_RELEASE_TABLET | Freq: Every day | ORAL | Status: AC
  Administered 2016-06-12 – 2016-06-14 (×3): 20 meq via ORAL
  Filled 2016-06-12 (×3): qty 1

## 2016-06-12 MED ORDER — FUROSEMIDE 40 MG PO TABS
40.0000 mg | ORAL_TABLET | Freq: Every day | ORAL | Status: AC
  Administered 2016-06-12 – 2016-06-14 (×3): 40 mg via ORAL
  Filled 2016-06-12 (×3): qty 1

## 2016-06-12 NOTE — Discharge Instructions (Signed)
Video-Assisted Thoracic Surgery, Care After °Refer to this sheet in the next few weeks. These instructions provide you with information on caring for yourself after your procedure. Your caregiver may also give you more specific instructions. Your procedure has been planned according to current medical practices, but problems sometimes occur. Call your caregiver if you have any problems or questions after your procedure. °HOME CARE INSTRUCTIONS  °· Only take over-the-counter or prescription medications as directed. °· Only take pain medications (narcotics) as directed. °· Do not drive until your caregiver approves. Driving while taking narcotics or soon after surgery can be dangerous, so discuss the specific timing with your caregiver. °· Avoid activities that use your chest muscles, such as lifting heavy objects, for at least 3-4 weeks.   °· Take deep breaths to expand the lungs and to protect against pneumonia. °· Do breathing exercises as directed by your caregiver. If you were given an incentive spirometer to help with breathing, use it as directed. °· You may resume a normal diet and activities when you feel you are able to or as directed. °· Do not take a bath until your caregiver says it is OK. Use the shower instead.   °· Keep the bandage (dressing) covering the area where the chest tube was inserted (incision site) dry for 48 hours. After 48 hours, remove the dressing unless there is new drainage. °· Remove dressings as directed by your caregiver. °· Change dressings if necessary or as directed. °· Keep all follow-up appointments. It is important for you to see your caregiver after surgery to discuss appropriate follow-up care and surveillance, if it is necessary. °SEEK MEDICAL CARE: °· You feel excessive or increasing pain at an incision site. °· You notice bleeding, skin irritation, drainage, swelling, or redness at an incision site. °· There is a bad smell coming from an incision or dressing. °· It feels  like your heart is fluttering or beating rapidly. °· Your pain medication does not relieve your pain. °SEEK IMMEDIATE MEDICAL CARE IF:  °· You have a fever.   °· You have chest pain.  °· You have a rash. °· You have shortness of breath. °· You have trouble breathing.   °· You feel weak, lightheaded, dizzy, or faint.   °MAKE SURE YOU:  °· Understand these instructions.   °· Will watch your condition.   °· Will get help right away if you are not doing well or get worse. °This information is not intended to replace advice given to you by your health care provider. Make sure you discuss any questions you have with your health care provider. °Document Released: 11/05/2012 Document Revised: 08/01/2014 Document Reviewed: 11/05/2012 °Elsevier Interactive Patient Education © 2017 [employer] ° °

## 2016-06-12 NOTE — Discharge Summary (Signed)
Physician Discharge Summary  Patient ID: KAIRO EARHART MRN: [id number] DOB/AGE: 59-02-1956 59 y.o.  Admit date: 06/10/2016 Discharge date: 06/14/2016  Admission Diagnoses:  Patient Active Problem List   Diagnosis Date Noted  . Dysphagia, pharyngoesophageal phase 01/21/2016  . Thymoma 10/21/2015  . Essential hypertension 09/25/2015  . DDD (degenerative disc disease), cervical 06/15/2015  . Seborrheic dermatitis of scalp 02/11/2015  . Primary osteoarthritis involving multiple joints 01/21/2015  . Hyperglycemia 01/21/2015  . History of colonic polyps 01/21/2015  . Hyperlipidemia with target LDL less than 100 01/21/2015  . GERD (gastroesophageal reflux disease) 05/08/2013  . Atherosclerosis of aorta ([place]) 05/08/2013  . Preventative health care 12/17/2011  . ALLERGIC RHINITIS 10/01/2010  . PANIC DISORDER 06/08/2007  . Obsessive-compulsive disorder 06/08/2007  . Asthma with bronchitis 06/08/2007   Discharge Diagnoses:   Patient Active Problem List   Diagnosis Date Noted  . Dysphagia, pharyngoesophageal phase 01/21/2016  . Thymoma 10/21/2015  . Essential hypertension 09/25/2015  . DDD (degenerative disc disease), cervical 06/15/2015  . Seborrheic dermatitis of scalp 02/11/2015  . Primary osteoarthritis involving multiple joints 01/21/2015  . Hyperglycemia 01/21/2015  . History of colonic polyps 01/21/2015  . Hyperlipidemia with target LDL less than 100 01/21/2015  . GERD (gastroesophageal reflux disease) 05/08/2013  . Atherosclerosis of aorta ([place]) 05/08/2013  . Preventative health care 12/17/2011  . ALLERGIC RHINITIS 10/01/2010  . PANIC DISORDER 06/08/2007  . Obsessive-compulsive disorder 06/08/2007  . Asthma with bronchitis 06/08/2007   Discharged Condition: good  History of Present Illness:  Ms. Fatheree is a 60 yo white female with history of previous nicotine abuse, asthma/bronchitis, allergic rhinitis, OCD, panic/anxiety disorder, and GERD.  The patient  has been routinely followed in the Pulmonary office.  During her visit in the spring she was complaining of increased shortness of breath, wheezing, productive green cough, chest pain and tightness.  Workup included a CXR which showed a nodule in the lateral view felt to be an anterior mediastinal mass.  CT scan was obtained and showed the lesion to be stable from previous scan.  She also had a PET CT scan which showed her thyroid to be enlarged and was hypermetabolic.  She was referred to [facility] for evaluation.  She was evaluated by Dr. Servando Snare who evaluated the patient and felt close monitoring would be appropriate.  Since her initial visit she underwent workup on her enlarged thyroid.  Needle biopsy was performed and was benign.  She again presented for 6 month follow up with repeat CT scan in September which showed in comparison to previous scans the nodule had enlarged in size.  It was felt this was likely thymoma.  The patient denied symptoms of myasthenia gravis other than some mild swallowing dysfunction.  It was recommended surgical resection be performed.  The risks and benefits of the procedure were explained to the patient and she was agreeable to proceed.  Cardiac clearance was obtained prior to proceeding with surgery.     Hospital Course:   Ms. Bachand presented to [facility].  She was taken to the operating room and underwent Video Assisted Thoracoscopy with Mini Thoracotomy, Resection of Anterior Mediastinal Mass, and placement of ON- Q anesthetic catheter.  She tolerated the procedure without difficulty, was extubated and taken to the SICU in stable condition.  The patient developed short bursts of Atrial Fibrillation and SVT post operatively.  She was treated with prn IV Lopressor for elevated HR and started on oral Lopressor.  Her blood pressure was  on the low side and therefore, parameters for administration were placed.  Her chest tubes were free from air leak.   There was minimal output and CXR was free from pneumothorax.  Her anterior chest tube was removed and her remaining chest tube was placed on water seal on POD #1.  She developed hypervolemia.  Her IV fluids was discontinued and she was treated with Lasix.  Follow up CXR was free from pneumothorax.  Her final chest tube was removed on POD #2.  She had no further episodes of SVT or Atrial Fibrillation.  She was medically stable for transfer to the telemetry unit also on POD #2.  The patient continued to make progress.  She responded well to diuresis.  Follow up CXR remained stable and free from pneumothorax.  She was ambulating independently.  She was tolerating a regular diet without nausea or vomiting.  Her pain is well controlled.  She is felt medically stable for discharge home today.    Note:  [facility] result was Thymoma... Final pathology remains pending      Significant Diagnostic Studies: radiology:   CT scan: 1. Interval growth of the previously noted well-circumscribed soft tissue attenuation lesion in the anterior mediastinum. This lesion is well separated from the thyroid gland, and demonstrates no internal cystic change and no calcification. There is no associated pleural nodularity or pleural effusion. Overall, the imaging features favor a benign lesion such as a thymoma, however, the continued interval growth is concerning. Biopsy or resection of the lesion is recommended if clinically appropriate. 2. There are calcifications of the aortic valve. Echocardiographic correlation for evaluation of potential valvular dysfunction may be warranted if clinically indicated. 3. 2.1 x 1.8 cm nodule in the right lobe of the thyroid gland appears similar to the prior examination, per report, previously biopsied and benign. 4. Aortic atherosclerosis.  Treatments: surgery:   VIDEO BRONCHOSCOPY (N/A) VIDEO ASSISTED THORACOSCOPY/MINI THORACOTOMY -Resection Anterior Mediastinal  Mass -Placement of ON-Q Anesthetic Pain Catheter  Disposition: Home  Discharge Medications:     Medication List    TAKE these medications   albuterol (2.5 MG/3ML) 0.083% nebulizer solution Commonly known as:  PROVENTIL Take 3 mLs (2.5 mg total) by nebulization every 6 (six) hours. Dx COPD with bronchitis   albuterol 108 (90 Base) MCG/ACT inhaler Commonly known as:  PROAIR HFA Inhale 1-2 puffs into the lungs every 4 (four) hours as needed for wheezing or shortness of breath.   ALPRAZolam 1 MG tablet Commonly known as:  XANAX TAKE 1 TABLET BY MOUTH EVERY 6 HOURS AS NEEDED   aspirin EC 81 MG tablet Take 81 mg by mouth daily.   CO Q 10 PO Take 1 tablet by mouth daily.   esomeprazole 40 MG capsule Commonly known as:  NEXIUM TAKE 1 CAPSULE (40 MG TOTAL) BY MOUTH DAILY.   Fluticasone-Salmeterol 500-50 MCG/DOSE Aepb Commonly known as:  ADVAIR DISKUS INHALE 1 PUFF BY MOUTH INTO THE LUNGS EVERY 12 HOURS (RINSE MOUTH AFTERWARDS) What changed:  additional instructions   HYDROcodone-acetaminophen 10-325 MG tablet Commonly known as:  NORCO Take 1 tablet by mouth every 4 (four) hours as needed (for pain). What changed:  when to take this   hydrocortisone 2.5 % cream Apply 1 application topically daily as needed (itching).   ibuprofen 800 MG tablet Commonly known as:  ADVIL,MOTRIN Take 800 mg by mouth 3 (three) times daily as needed for moderate pain.   metoprolol tartrate 25 MG tablet Commonly known as:  LOPRESSOR Take  0.5 tablets (12.5 mg total) by mouth 2 (two) times daily.   RED YEAST RICE PO Take 1 tablet by mouth every other day.   SUPER B COMPLEX/C PO Take 1 tablet by mouth daily.      Follow-up Information    Grace Isaac, MD Follow up in 2 week(s).   Specialty:  Cardiothoracic Surgery Why:  Office will contact you with appointment date and time.  Please get CXR 30 min prior to your appointment at [facility], located on first floor of our office  building Contact information: [place] [address] [phone]        Scarlette Calico, MD. Call in 1 day(s).   Specialty:  Internal Medicine Contact information: [address]LO[address]  [place] [address] [phone]        nursing Follow up.   Why:  Please follow-up next week with a suture removal appointment. Our office will call you.  Contact information: Dr. Carrie Mew office          Signed: Elgie Collard 06/14/2016, 9:18 AM

## 2016-06-12 NOTE — Progress Notes (Signed)
      [address]       [place] [address]             [phone]        CARDIOTHORACIC SURGERY PROGRESS NOTE   R2 Days Post-Op Procedure(s) (LRB): VIDEO BRONCHOSCOPY (N/A) VIDEO ASSISTED THORACOSCOPY WITH PLACEMENT OF ON Q TUNNELER (Right) RESECTION OF MEDIASTINAL MASS (N/A)  Subjective: Poor appetite but otherwise feels better.  Complains of swelling in hands and feet, asking why her weight is 9-10 lbs above baseline  Objective: Vital signs: BP Readings from Last 1 Encounters:  06/12/16 (!) 86/56   Pulse Readings from Last 1 Encounters:  06/12/16 74   Resp Readings from Last 1 Encounters:  06/12/16 18   Temp Readings from Last 1 Encounters:  06/12/16 98.2 F (36.8 C) (Oral)    Hemodynamics:    Physical Exam:  Rhythm:   sinus  Breath sounds: clear  Heart sounds:  RRR  Incisions:  Dressing dry, intact  Abdomen:  Soft, non-distended, non-tender  Extremities:  Warm, well-perfused  Chest tubes:  low volume thin serosanguinous output, no air leak    Intake/Output from previous day: 11/18 0701 - 11/19 0700 In: 1760 [P.O.:410; I.V.:1350] Out: 1875 [Urine:1515; Emesis/NG output:100; Chest Tube:260] Intake/Output this shift: No intake/output data recorded.  Lab Results:  CBC: Recent Labs  06/11/16 0235 06/12/16 0343  WBC 7.6 7.2  HGB 12.4 12.1  HCT 38.0 38.4  PLT 231 216    BMET:  Recent Labs  06/11/16 0235 06/12/16 0343  NA 137 135  K 3.8 4.2  CL 103 102  CO2 28 27  GLUCOSE 134* 138*  BUN 5* <5*  CREATININE 0.55 0.57  CALCIUM 7.9* 7.9*     PT/INR:  No results for input(s): LABPROT, INR in the last 72 hours.  CBG (last 3)  No results for input(s): GLUCAP in the last 72 hours.  ABG    Component Value Date/Time   PHART 7.344 (L) 06/11/2016 0349   PCO2ART 54.9 (H) 06/11/2016 0349   PO2ART 76.0 (L) 06/11/2016 0349   HCO3 29.9 (H) 06/11/2016 0349   TCO2 32 06/11/2016 0349   O2SAT 94.0 06/11/2016 0349     CXR: PORTABLE CHEST 1 VIEW  COMPARISON:  1 day prior  FINDINGS: Removal of 1 of the 2 right-sided chest tubes. Midline trachea. Normal heart size. Suspect trace bilateral pleural effusions. No pneumothorax. Improved aeration with resolved interstitial edema. Mild pulmonary venous congestion remains. Persistent bibasilar atelectasis.  IMPRESSION: Removal of 1 of 2 right-sided chest tubes, without pneumothorax.  Improved aeration with resolved interstitial edema and decreased bibasilar atelectasis. Probable trace bilateral pleural effusions.   Electronically Signed   By: Abigail Miyamoto M.D.   On: 06/12/2016 07:58  Assessment/Plan: S/P Procedure(s) (LRB): VIDEO BRONCHOSCOPY (N/A) VIDEO ASSISTED THORACOSCOPY WITH PLACEMENT OF ON Q TUNNELER (Right) RESECTION OF MEDIASTINAL MASS (N/A)  Doing well D/C last chest tube D/C PCA Mobilize Diuresis for volume overload Transfer floor  Rexene Alberts, MD 06/12/2016 10:24 AM

## 2016-06-13 ENCOUNTER — Inpatient Hospital Stay (HOSPITAL_COMMUNITY): Payer: Medicare Other

## 2016-06-13 ENCOUNTER — Encounter (HOSPITAL_COMMUNITY): Payer: Self-pay | Admitting: Cardiothoracic Surgery

## 2016-06-13 LAB — POCT I-STAT 7, (LYTES, BLD GAS, ICA,H+H)
Acid-Base Excess: 1 mmol/L (ref 0.0–2.0)
Bicarbonate: 26.9 mmol/L (ref 20.0–28.0)
Calcium, Ion: 1.17 mmol/L (ref 1.15–1.40)
HCT: 38 % (ref 36.0–46.0)
Hemoglobin: 12.9 g/dL (ref 12.0–15.0)
O2 Saturation: 91 %
Patient temperature: 36
Potassium: 4.4 mmol/L (ref 3.5–5.1)
Sodium: 140 mmol/L (ref 135–145)
TCO2: 28 mmol/L (ref 0–100)
pCO2 arterial: 47.2 mmHg (ref 32.0–48.0)
pH, Arterial: 7.359 (ref 7.350–7.450)
pO2, Arterial: 60 mmHg — ABNORMAL LOW (ref 83.0–108.0)

## 2016-06-13 NOTE — Care Management Note (Signed)
Case Management Note  Patient Details  Name: Felicia Odom MRN: [id number] Date of Birth: 06-02-1956  Subjective/Objective:    Pt admitted with Tymoma                Action/Plan:  PTA pt was completely independent.  During assessment pt was completely alert and oriented on room air.  Pt states her adult son stay with her and can provide support when needed post discharge.  CM will continue to follow for discharge needs   Expected Discharge Date:                  Expected Discharge Plan:  Home/Self Care  In-House Referral:     Discharge planning Services  CM Consult  Post Acute Care Choice:    Choice offered to:     DME Arranged:    DME Agency:     HH Arranged:    HH Agency:     Status of Service:  In process, will continue to follow  If discussed at Long Length of Stay Meetings, dates discussed:    Additional Comments:  Maryclare Labrador, RN 06/13/2016, 10:30 AM

## 2016-06-13 NOTE — Anesthesia Postprocedure Evaluation (Signed)
Anesthesia Post Note  Patient: Felicia Odom  Procedure(s) Performed: Procedure(s) (LRB): VIDEO BRONCHOSCOPY (N/A) VIDEO ASSISTED THORACOSCOPY WITH PLACEMENT OF ON Q TUNNELER (Right) RESECTION OF MEDIASTINAL MASS (N/A)  Patient location during evaluation: PACU Anesthesia Type: General Level of consciousness: awake and sedated Pain management: pain level controlled Vital Signs Assessment: post-procedure vital signs reviewed and stable Respiratory status: spontaneous breathing, nonlabored ventilation, respiratory function stable and patient connected to nasal cannula oxygen Cardiovascular status: blood pressure returned to baseline and stable Postop Assessment: no signs of nausea or vomiting Anesthetic complications: no    Last Vitals:  Vitals:   06/13/16 0000 06/13/16 0400  BP: 137/85 136/69  Pulse: 73   Resp: (!) 22   Temp: 36.6 C 36.6 C    Last Pain:  Vitals:   06/13/16 0600  TempSrc:   PainSc: 2                  [place] Perra,JAMES TERRILL

## 2016-06-13 NOTE — Progress Notes (Signed)
Report given to Juliann Pulse, Therapist, sports. Patient ambulated from 2S12 to 2W14. Patient tolerated walk well. Patient in bed, tele on, call bell in reach and receiving RN at bedside. No further questions from patient or RN.  Rowe Pavy, RN

## 2016-06-13 NOTE — Progress Notes (Signed)
[address]       [place] [address]             [phone]                 3 Days Post-Op Procedure(s) (LRB): VIDEO BRONCHOSCOPY (N/A) VIDEO ASSISTED THORACOSCOPY WITH PLACEMENT OF ON Q TUNNELER (Right) RESECTION OF MEDIASTINAL MASS (N/A)  LOS: 3 days   Subjective: Sore, slow moving around but improving with tubes out   Objective: Vital signs in last 24 hours: Patient Vitals for the past 24 hrs:  BP Temp Temp src Pulse Resp SpO2 Weight  06/13/16 1434 - - - - - 98 % -  06/13/16 1129 (!) 141/81 98.3 F (36.8 C) Oral 79 18 98 % -  06/13/16 1100 - - - - 19 - -  06/13/16 1000 - - - - 19 - -  06/13/16 0944 - - - - - 96 % -  06/13/16 0915 119/71 - - 85 - - -  06/13/16 0900 - - - - 13 - -  06/13/16 0824 - 98.3 F (36.8 C) Oral - - - -  06/13/16 0800 - - - - 18 - -  06/13/16 0700 - - - - 14 - -  06/13/16 0600 - - - 72 15 93 % -  06/13/16 0500 - - - 71 16 91 % -  06/13/16 0400 136/69 97.8 F (36.6 C) Oral - - - 195 lb 1.7 oz (88.5 kg)  06/13/16 0000 137/85 97.9 F (36.6 C) Oral 73 (!) 22 96 % -  06/12/16 2000 134/65 98.1 F (36.7 C) Oral 86 (!) 23 99 % -    Filed Weights   06/11/16 0600 06/12/16 0545 06/13/16 0400  Weight: 198 lb 6.6 oz (90 kg) 199 lb 4.7 oz (90.4 kg) 195 lb 1.7 oz (88.5 kg)    Hemodynamic parameters for last 24 hours:    Intake/Output from previous day: 11/19 0701 - 11/20 0700 In: 850 [P.O.:850] Out: 2200 [Urine:2200] Intake/Output this shift: Total I/O In: 120 [P.O.:120] Out: -   Scheduled Meds: . acetaminophen  1,000 mg Oral Q6H   Or  . acetaminophen (TYLENOL) oral liquid 160 mg/5 mL  1,000 mg Oral Q6H  . aspirin EC  81 mg Oral Daily  . bisacodyl  10 mg Oral Daily  . enoxaparin (LOVENOX) injection  40 mg Subcutaneous Q24H  . furosemide  40 mg Oral Daily  . levalbuterol  0.63 mg Nebulization TID  . mouth rinse  15 mL Mouth Rinse BID  . metoprolol tartrate  12.5 mg Oral BID  . mometasone-formoterol  2 puff  Inhalation BID  . pantoprazole  80 mg Oral Q1200  . potassium chloride  20 mEq Oral Daily  . senna-docusate  1 tablet Oral QHS   Continuous Infusions: PRN Meds:.albuterol, ALPRAZolam, diphenhydrAMINE **OR** diphenhydrAMINE, HYDROcodone-acetaminophen, metoprolol, naloxone **AND** sodium chloride flush, ondansetron (ZOFRAN) IV, ondansetron (ZOFRAN) IV, pneumococcal 23 valent vaccine, potassium chloride, traMADol  General appearance: alert and cooperative Neurologic: intact Heart: regular rate and rhythm, S1, S2 normal, no murmur, click, rub or gallop Lungs: diminished breath sounds bibasilar Abdomen: soft, non-tender; bowel sounds normal; no masses,  no organomegaly Extremities: extremities normal, atraumatic, no cyanosis or edema and Homans sign is negative, no sign of DVT Wound: wounds inatct  Lab Results: CBC: Recent Labs  06/11/16 0235 06/12/16 0343  WBC 7.6 7.2  HGB 12.4 12.1  HCT 38.0 38.4  PLT 231 216  BMET:  Recent Labs  06/11/16 0235 06/12/16 0343  NA 137 135  K 3.8 4.2  CL 103 102  CO2 28 27  GLUCOSE 134* 138*  BUN 5* <5*  CREATININE 0.55 0.57  CALCIUM 7.9* 7.9*    PT/INR: No results for input(s): LABPROT, INR in the last 72 hours.   Radiology Dg Chest 2 View  Result Date: 06/13/2016 CLINICAL DATA:  Atelectasis, soreness with surgery EXAM: CHEST  2 VIEW COMPARISON:  06/12/2016 FINDINGS: Normal cardiac silhouette. There is band of atelectasis in the RIGHT mid lung not changed from prior. Bibasilar atelectasis small effusion unchanged. No pneumothorax. IMPRESSION: 1. No pneumothorax following chest tube removal. 2. Mild basilar atelectasis and small effusion. Electronically Signed   By: Suzy Bouchard M.D.   On: 06/13/2016 07:43   Dg Chest Port 1 View  Result Date: 06/12/2016 CLINICAL DATA:  Mediastinal mass. EXAM: PORTABLE CHEST 1 VIEW COMPARISON:  1 day prior FINDINGS: Removal of 1 of the 2 right-sided chest tubes. Midline trachea. Normal heart size.  Suspect trace bilateral pleural effusions. No pneumothorax. Improved aeration with resolved interstitial edema. Mild pulmonary venous congestion remains. Persistent bibasilar atelectasis. IMPRESSION: Removal of 1 of 2 right-sided chest tubes, without pneumothorax. Improved aeration with resolved interstitial edema and decreased bibasilar atelectasis. Probable trace bilateral pleural effusions. Electronically Signed   By: Abigail Miyamoto M.D.   On: 06/12/2016 07:58     Assessment/Plan: S/P Procedure(s) (LRB): VIDEO BRONCHOSCOPY (N/A) VIDEO ASSISTED THORACOSCOPY WITH PLACEMENT OF ON Q TUNNELER (Right) RESECTION OF MEDIASTINAL MASS (N/A) Mobilize work on [employer] d/c in am   Grace Isaac MD 06/13/2016 6:09 PM     Patient ID: Felicia Odom, female   DOB: 01/03/1956, 60 y.o.   MRN: [id number]

## 2016-06-13 NOTE — Op Note (Signed)
NAMEMarland Odom  [name], [name] NO.:  [id number]  MEDICAL RECORD NO.:  [id number]  LOCATION:  2S12C                        FACILITY:  [place]  PHYSICIAN:  Lanelle Bal, MD    DATE OF BIRTH:  August 19, 1955  DATE OF PROCEDURE:  06/10/2016 DATE OF DISCHARGE:                              OPERATIVE REPORT   PREOPERATIVE DIAGNOSIS:  Anterior mediastinal mass.  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  PROCEDURE PERFORMED:  Bronchoscopy, right video-assisted thoracoscopy, resection of anterior mediastinal tumor.  SURGEON:  Lanelle Bal, MD.  FIRST ASSISTANT:  Providence Crosby, PA.  BRIEF HISTORY:  The patient is a 60 year old female with underlying pulmonary disease, who has been followed for her COPD.  In 2006, a CT scan of the chest showed a very small anterior mediastinal mass. Overtime, this has continued to grow approximately 8 months prior.  The patient was seen in consultation by me and had evidence of anterior mediastinal mass, was consistent with thymoma, that was larger than it had been in 2006.  The patient was reluctant to have any surgical procedure that is done.  A follow up CT scan 6 months later showed that the mass had increased in size.  Because of this, it was recommended to the patient that we should proceed with resection, she was agreeable, underwent pulmonary and cardiac clearance and signed informed consent. Mass was towards the right side of the mediastinum anteriorly.  We elected to approach this through a right video-assisted thoracoscopy. The patient agreed and signed informed consent.  DESCRIPTION OF PROCEDURE:  With central line in place, an arterial line in place, the patient underwent general endotracheal anesthesia with a single-lumen endotracheal tube.  Appropriate time-out was performed, then proceeded with a bronchoscopy with a 2.8 cm fiberoptic bronchoscope to the subsegmental level both in  the right and left tracheobronchial tree without evidence of endobronchial lesions.  The scope was removed. The double-lumen endotracheal tube was placed.  The patient was turned in lateral decubitus position with the right side up.  The right chest was prepped with Betadine and draped in sterile manner.  An appropriate second time-out was performed and we then proceeded with collapsing the right lung in the 4th intercostal space, approximately anterior axillary line, a small incision was made and through this, a port was introduced with given access to the anterior mediastinum with a 30-degree scope.  A 2nd slightly lower and more anterior incision was also made.  The initial incision was enlarged slightly and through the two sites, we then proceeded to dissect into the anterior mediastinum, anterior to the superior vena cava and in the mediastinal fat but without evidence of invasion, a well circumscribed 2.5 to 3 cm mass was easily removed. There were no features that this was invading into any mediastinal tissue.  The specimen was partially cystic and partially solid and was sent to Pathology for frozen section.  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  With the operative field hemostatic, two Blake drains were left in place.  An ON-Q was tunneled subpleurally posteriorly.  The incision was closed with interrupted 0 Vicryl, running 3-0 Vicryl subcutaneous tissue, and a 3-0 subcuticular stitch.  The working incision was  approximately 2 to 3 cm in size.  The patient tolerated the procedure without obvious complication.  She was extubated in the operating room.  Sponge and needle counts were reported as correct at the completion of procedure.  There was minimal blood loss.     Lanelle Bal, MD     EG/MEDQ  D:  06/13/2016  T:  06/13/2016  Job:  [id number]

## 2016-06-14 MED ORDER — METOPROLOL TARTRATE 25 MG PO TABS: 12.5000 mg | ORAL_TABLET | Freq: Two times a day (BID) | ORAL | 1 refills | Status: DC

## 2016-06-14 MED ORDER — HYDROCODONE-ACETAMINOPHEN 10-325 MG PO TABS: 1.0000 | ORAL_TABLET | ORAL | 0 refills | Status: DC | PRN

## 2016-06-14 NOTE — Progress Notes (Addendum)
      [address]       [place] [address]             [phone]      4 Days Post-Op Procedure(s) (LRB): VIDEO BRONCHOSCOPY (N/A) VIDEO ASSISTED THORACOSCOPY WITH PLACEMENT OF ON Q TUNNELER (Right) RESECTION OF MEDIASTINAL MASS (N/A) Subjective: Feels like her breast is burning and vibrating.   Objective: Vital signs in last 24 hours: Temp:  [97.9 F (36.6 C)-98.3 F (36.8 C)] 98 F (36.7 C) (11/21 0447) Pulse Rate:  [64-85] 64 (11/21 0447) Cardiac Rhythm: Normal sinus rhythm (11/21 0715) Resp:  [13-19] 18 (11/21 0447) BP: (101-141)/(59-81) 101/59 (11/21 0447) SpO2:  [95 %-98 %] 95 % (11/21 0447)    Intake/Output from previous day: 11/20 0701 - 11/21 0700 In: 360 [P.O.:360] Out: -  Intake/Output this shift: No intake/output data recorded.  General appearance: alert, cooperative and no distress Heart: regular rate and rhythm Lungs: clear to auscultation bilaterally Abdomen: soft, non-tender; bowel sounds normal; no masses,  no organomegaly Extremities: extremities normal, atraumatic, no cyanosis or edema Wound: clean and dry  Lab Results:  Recent Labs  06/12/16 0343  WBC 7.2  HGB 12.1  HCT 38.4  PLT 216   BMET:  Recent Labs  06/12/16 0343  NA 135  K 4.2  CL 102  CO2 27  GLUCOSE 138*  BUN <5*  CREATININE 0.57  CALCIUM 7.9*    PT/INR: No results for input(s): LABPROT, INR in the last 72 hours. ABG    Component Value Date/Time   PHART 7.344 (L) 06/11/2016 0349   HCO3 29.9 (H) 06/11/2016 0349   TCO2 32 06/11/2016 0349   O2SAT 94.0 06/11/2016 0349   CBG (last 3)  No results for input(s): GLUCAP in the last 72 hours.  Assessment/Plan: S/P Procedure(s) (LRB): VIDEO BRONCHOSCOPY (N/A) VIDEO ASSISTED THORACOSCOPY WITH PLACEMENT OF ON Q TUNNELER (Right) RESECTION OF MEDIASTINAL MASS (N/A)  1. Work on pain control. It has improved since surgery.  2. Encourage IS and pulm toilet, increase activity 3. Plan for d/c today if  okay with attending   LOS: 4 days    Elgie Collard 06/14/2016  Reviewed path with patient  Plan d/c today

## 2016-06-14 NOTE — Care Management Important Message (Signed)
Important Message  Patient Details  Name: Felicia Odom MRN: [id number] Date of Birth: 05/26/1956   Medicare Important Message Given:  Yes    Elana Jian Abena 06/14/2016, 10:08 AM

## 2016-06-15 NOTE — Consult Note (Addendum)
            [facility] CM Primary Care Navigator  06/15/2016  SHERMANE RAUSER 27-Jan-1956 [id number]    Wentto see patient in room to identify possible discharge needs butstaff states patient was already discharged.  Primary care provider's office was called Edwena Blow) and notified of patient's discharge and need for post hospital follow-up and transition of care.Made aware to refer to [facility] care management for care coordination services if deemed appropriate.  Edwena Blow reports that patient already has a post hospital follow-up appointment scheduled to 06/20/16.   For additional questions please contact:  Edwena Felty A. Paisleigh Maroney, BSN, RN-BC [facility] PRIMARY CARE Navigator Cell: [phone]

## 2016-06-20 ENCOUNTER — Other Ambulatory Visit: Payer: Self-pay

## 2016-06-20 ENCOUNTER — Encounter: Payer: Self-pay | Admitting: Internal Medicine

## 2016-06-20 ENCOUNTER — Other Ambulatory Visit: Payer: Medicare Other

## 2016-06-20 ENCOUNTER — Ambulatory Visit (INDEPENDENT_AMBULATORY_CARE_PROVIDER_SITE_OTHER): Payer: Self-pay

## 2016-06-20 ENCOUNTER — Ambulatory Visit (INDEPENDENT_AMBULATORY_CARE_PROVIDER_SITE_OTHER): Payer: Medicare Other | Admitting: Internal Medicine

## 2016-06-20 VITALS — BP 130/80 | HR 57 | Temp 97.7°F | Resp 16 | Ht 66.5 in | Wt 193.8 lb

## 2016-06-20 DIAGNOSIS — J449 Chronic obstructive pulmonary disease, unspecified: Secondary | ICD-10-CM | POA: Diagnosis not present

## 2016-06-20 DIAGNOSIS — I700 Atherosclerosis of aorta: Secondary | ICD-10-CM | POA: Diagnosis not present

## 2016-06-20 DIAGNOSIS — R222 Localized swelling, mass and lump, trunk: Secondary | ICD-10-CM

## 2016-06-20 DIAGNOSIS — M150 Primary generalized (osteo)arthritis: Secondary | ICD-10-CM

## 2016-06-20 DIAGNOSIS — D150 Benign neoplasm of thymus: Secondary | ICD-10-CM

## 2016-06-20 DIAGNOSIS — M5030 Other cervical disc degeneration, unspecified cervical region: Secondary | ICD-10-CM | POA: Diagnosis not present

## 2016-06-20 DIAGNOSIS — Z4802 Encounter for removal of sutures: Secondary | ICD-10-CM

## 2016-06-20 DIAGNOSIS — I10 Essential (primary) hypertension: Secondary | ICD-10-CM | POA: Diagnosis not present

## 2016-06-20 DIAGNOSIS — M159 Polyosteoarthritis, unspecified: Secondary | ICD-10-CM

## 2016-06-20 DIAGNOSIS — E785 Hyperlipidemia, unspecified: Secondary | ICD-10-CM

## 2016-06-20 DIAGNOSIS — R739 Hyperglycemia, unspecified: Secondary | ICD-10-CM

## 2016-06-20 DIAGNOSIS — J9859 Other diseases of mediastinum, not elsewhere classified: Secondary | ICD-10-CM

## 2016-06-20 MED ORDER — HYDROCODONE-ACETAMINOPHEN 10-325 MG PO TABS: 1.0000 | ORAL_TABLET | Freq: Four times a day (QID) | ORAL | 0 refills | Status: DC | PRN

## 2016-06-20 NOTE — Progress Notes (Signed)
Pre visit review using our clinic review tool, if applicable. No additional management support is needed unless otherwise documented below in the visit note. 

## 2016-06-20 NOTE — Progress Notes (Signed)
Subjective:  Patient ID: Felicia Odom, female    DOB: Aug 12, 1955  Age: 60 y.o. 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 The wounds on her chest are healing nicely but she complains of diffuse right side chest wall pain in addition to her usual large joint pain.  Outpatient Medications Prior to Visit  Medication Sig Dispense Refill  . albuterol (PROAIR HFA) 108 (90 Base) MCG/ACT inhaler Inhale 1-2 puffs into the lungs every 4 (four) hours as needed for wheezing or shortness of breath. 8.5 Inhaler 12  . albuterol (PROVENTIL) (2.5 MG/3ML) 0.083% nebulizer solution Take 3 mLs (2.5 mg total) by nebulization every 6 (six) hours. Dx COPD with bronchitis 75 mL 12  . ALPRAZolam (XANAX) 1 MG tablet TAKE 1 TABLET BY MOUTH EVERY 6 HOURS AS NEEDED 120 tablet 5  . aspirin EC 81 MG tablet Take 81 mg by mouth daily.    Marland Kitchen esomeprazole (NEXIUM) 40 MG capsule TAKE 1 CAPSULE (40 MG TOTAL) BY MOUTH DAILY. 30 capsule 6  . Fluticasone-Salmeterol (ADVAIR DISKUS) 500-50 MCG/DOSE AEPB INHALE 1 PUFF BY MOUTH INTO THE LUNGS EVERY 12 HOURS (RINSE MOUTH AFTERWARDS) (Patient taking differently: INHALE 1 PUFF BY MOUTH INTO THE LUNGS EVERY 12 HOURS (RINSE MOUTH AFTERWARDS) AS NEEDED FOR WHEEZING OR SHORTNESS OF BREATH) 1 each 12  . hydrocortisone 2.5 % cream Apply 1 application topically daily as needed (itching).     Marland Kitchen ibuprofen (ADVIL,MOTRIN) 800 MG tablet Take 800 mg by mouth 3 (three) times daily as needed for moderate pain.     . metoprolol tartrate (LOPRESSOR) 25 MG tablet Take 0.5 tablets (12.5 mg total) by mouth 2 (two) times daily. 30 tablet 1  . Coenzyme Q10 (CO Q 10 PO) Take 1 tablet by mouth daily.    Marland Kitchen HYDROcodone-acetaminophen (NORCO) 10-325 MG tablet Take 1 tablet by mouth every 4 (four) hours as needed (for pain). 30 tablet 0  . Red Yeast Rice Extract (RED YEAST RICE  PO) Take 1 tablet by mouth every other day.    . SUPER B COMPLEX/C PO Take 1 tablet by mouth daily.     No facility-administered medications prior to visit.     ROS Review of Systems  Constitutional: Negative for chills, diaphoresis, fatigue and fever.  HENT: Negative.   Eyes: Negative.   Respiratory: Negative for cough, choking, chest tightness, shortness of breath and stridor.   Cardiovascular: Positive for chest pain. Negative for palpitations and leg swelling.  Gastrointestinal: Negative.  Negative for abdominal pain, constipation, diarrhea, nausea and vomiting.  Endocrine: Negative.   Genitourinary: Negative.  Negative for difficulty urinating.  Musculoskeletal: Positive for arthralgias. Negative for back pain, joint swelling, myalgias and neck pain.  Skin: Negative.   Allergic/Immunologic: Negative.   Neurological: Negative.  Negative for dizziness and headaches.  Hematological: Negative.  Negative for adenopathy. Does not bruise/bleed easily.  Psychiatric/Behavioral: Positive for sleep disturbance. Negative for confusion, decreased concentration, dysphoric mood, self-injury and suicidal ideas. The patient is nervous/anxious.     Objective:  BP 130/80 (BP Location: Left Arm, Patient Position: Sitting, Cuff Size: Normal)   Pulse (!) 57   Temp 97.7 F (36.5 C) (Oral)   Resp 16   Ht 5' 6.5" (1.689 m)   Wt 193 lb 12 oz (87.9 kg)   SpO2 98%   BMI 30.80 kg/m   BP Readings from  Last 3 Encounters:  06/23/16 125/72  06/20/16 130/80  06/14/16 (!) 101/59    Wt Readings from Last 3 Encounters:  06/23/16 193 lb (87.5 kg)  06/20/16 193 lb 12 oz (87.9 kg)  06/13/16 195 lb 1.7 oz (88.5 kg)    Physical Exam  Constitutional: She is oriented to person, place, and time. No distress.  HENT:  Mouth/Throat: Oropharynx is clear and moist. No oropharyngeal exudate.  Eyes: Conjunctivae are normal. Right eye exhibits no discharge. Left eye exhibits no discharge. No scleral icterus.    Neck: Normal range of motion. Neck supple. No JVD present. No tracheal deviation present. No thyromegaly present.  Cardiovascular: Normal rate, regular rhythm, normal heart sounds and intact distal pulses.  Exam reveals no gallop and no friction rub.   No murmur heard. Pulmonary/Chest: Effort normal and breath sounds normal. No stridor. No respiratory distress. She has no wheezes. She has no rales. She exhibits deformity. She exhibits no mass, no tenderness, no edema and no swelling.    Abdominal: Soft. Bowel sounds are normal. She exhibits no distension and no mass. There is no tenderness. There is no rebound and no guarding.  Musculoskeletal: Normal range of motion. She exhibits no edema, tenderness or deformity.  Lymphadenopathy:    She has no cervical adenopathy.  Neurological: She is oriented to person, place, and time.  Skin: Skin is warm and dry. No rash noted. She is not diaphoretic. No erythema. No pallor.  Vitals reviewed.   Lab Results  Component Value Date   WBC 7.2 06/12/2016   HGB 12.1 06/12/2016   HCT 38.4 06/12/2016   PLT 216 06/12/2016   GLUCOSE 129 (H) 06/23/2016   CHOL 182 06/23/2016   TRIG 144.0 06/23/2016   HDL 35.20 (L) 06/23/2016   LDLDIRECT 142.0 01/21/2015   LDLCALC 118 (H) 06/23/2016   ALT 16 06/12/2016   AST 20 06/12/2016   NA 141 06/23/2016   K 3.9 06/23/2016   CL 106 06/23/2016   CREATININE 0.86 06/23/2016   BUN 12 06/23/2016   CO2 26 06/23/2016   TSH 1.73 01/21/2015   INR 0.89 06/08/2016   HGBA1C 6.0 06/23/2016    Dg Chest 2 View  Result Date: 06/08/2016 CLINICAL DATA:  Mediastinal mass preop EXAM: CHEST  2 VIEW COMPARISON:  CT chest 04/21/2016 FINDINGS: Heart size is normal. Mediastinal contours normal. Anterior mediastinal mass not identified on chest x-ray. Mild bibasilar scarring. Negative for infiltrate or effusion. Negative for heart failure. IMPRESSION: No active cardiopulmonary disease. Anterior mediastinal mass not visualized on  chest x-ray. Electronically Signed   By: Franchot Gallo M.D.   On: 06/08/2016 15:29    Assessment & Plan:   Felicia Odom was seen today for osteoarthritis and hyperlipidemia.  Diagnoses and all orders for this visit:  Essential hypertension- her blood pressure is well-controlled, electrolytes and renal function are stable. -     Basic metabolic panel; Future  Atherosclerosis of aorta ([place])- will continue risk factor modification. -     Lipid panel; Future  DDD (degenerative disc disease), cervical -     HYDROcodone-acetaminophen (NORCO) 10-325 MG tablet; Take 1 tablet by mouth every 6 (six) hours as needed (for pain).  Primary osteoarthritis involving multiple joints -     HYDROcodone-acetaminophen (NORCO) 10-325 MG tablet; Take 1 tablet by mouth every 6 (six) hours as needed (for pain).  Hyperglycemia- her A1c is up to 6.0%. She is prediabetic. No medications are needed at this time. -     Basic metabolic  panel; Future -     Hemoglobin A1c; Future  Hyperlipidemia with target LDL less than 100- her Framingham risk score only 4% so I do not recommend that she start taking a statin. -     Lipid panel; Future -     Thyroid Panel With TSH; Future   I have discontinued Ms. Felten's SUPER B COMPLEX/C PO, Red Yeast Rice Extract (RED YEAST RICE PO), and Coenzyme Q10 (CO Q 10 PO). I have also changed her HYDROcodone-acetaminophen. Additionally, I am having her maintain her esomeprazole, albuterol, Fluticasone-Salmeterol, albuterol, ALPRAZolam, aspirin EC, ibuprofen, hydrocortisone, and metoprolol tartrate.  Meds ordered this encounter  Medications  . HYDROcodone-acetaminophen (NORCO) 10-325 MG tablet    Sig: Take 1 tablet by mouth every 6 (six) hours as needed (for pain).    Dispense:  120 tablet    Refill:  0     Follow-up: Return in about 3 months (around 09/20/2016).  Scarlette Calico, MD

## 2016-06-20 NOTE — Progress Notes (Signed)
Removed 4 sutures from chest tube sites.No signs of infection. The chest tube incision site under the right breast has a slight separation with no tunneling, Packed the wound with approximately 1 inch damp packing gauze covered with 2x2 and tape.  The  Wound bed was red and moist, no drainage or swelling. The surrounding skin was pink and intact. Patient's son will change the wet to dry dressing daily until closed. Patient tolerated well. She will call if the area develops any signs of infection.

## 2016-06-20 NOTE — Patient Instructions (Signed)
Arthritis Introduction Arthritis means joint pain. It can also mean joint disease. A joint is a place where bones come together. People who have arthritis may have:  Red joints.  Swollen joints.  Stiff joints.  Warm joints.  A fever.  A feeling of being sick. Follow these instructions at home: Pay attention to any changes in your symptoms. Take these actions to help with your pain and swelling. Medicines  Take over-the-counter and prescription medicines only as told by your doctor.  Do not take aspirin for pain if your doctor says that you may have gout. Activity  Rest your joint if your doctor tells you to.  Avoid activities that make the pain worse.  Exercise your joint regularly as told by your doctor. Try doing exercises like:  Swimming.  Water aerobics.  Biking.  Walking. Joint Care   If your joint is swollen, keep it raised (elevated) if told by your doctor.  If your joint feels stiff in the morning, try taking a warm shower.  If you have diabetes, do not apply heat without asking your doctor.  If told, apply heat to the joint:  Put a towel between the joint and the hot pack or heating pad.  Leave the heat on the area for 20-30 minutes.  If told, apply ice to the joint:  Put ice in a plastic bag.  Place a towel between your skin and the bag.  Leave the ice on for 20 minutes, 2-3 times per day.  Keep all follow-up visits as told by your doctor. Contact a doctor if:  The pain gets worse.  You have a fever. Get help right away if:  You have very bad pain in your joint.  You have swelling in your joint.  Your joint is red.  Many joints become painful and swollen.  You have very bad back pain.  Your leg is very weak.  You cannot control your pee (urine) or poop (stool). This information is not intended to replace advice given to you by your health care provider. Make sure you discuss any questions you have with your health care  provider. Document Released: 10/05/2009 Document Revised: 12/17/2015 Document Reviewed: 10/06/2014  2017 Elsevier  

## 2016-06-23 ENCOUNTER — Ambulatory Visit
Admission: RE | Admit: 2016-06-23 | Discharge: 2016-06-23 | Disposition: A | Discharge status: Home / Self Care | Payer: Medicare Other | Source: Ambulatory Visit | Attending: Cardiothoracic Surgery | Admitting: Cardiothoracic Surgery

## 2016-06-23 ENCOUNTER — Ambulatory Visit (HOSPITAL_COMMUNITY)
Admission: RE | Admit: 2016-06-23 | Discharge: 2016-06-23 | Disposition: A | Discharge status: Home / Self Care | Payer: Medicare Other | Source: Ambulatory Visit | Attending: Cardiothoracic Surgery | Admitting: Cardiothoracic Surgery

## 2016-06-23 ENCOUNTER — Other Ambulatory Visit (INDEPENDENT_AMBULATORY_CARE_PROVIDER_SITE_OTHER): Payer: Medicare Other

## 2016-06-23 ENCOUNTER — Other Ambulatory Visit: Payer: Self-pay | Admitting: *Deleted

## 2016-06-23 ENCOUNTER — Ambulatory Visit (INDEPENDENT_AMBULATORY_CARE_PROVIDER_SITE_OTHER): Payer: Self-pay | Admitting: Cardiothoracic Surgery

## 2016-06-23 ENCOUNTER — Encounter: Payer: Self-pay | Admitting: Cardiothoracic Surgery

## 2016-06-23 VITALS — BP 125/72 | HR 68 | Resp 16 | Ht 66.5 in | Wt 193.0 lb

## 2016-06-23 DIAGNOSIS — I10 Essential (primary) hypertension: Secondary | ICD-10-CM | POA: Diagnosis not present

## 2016-06-23 DIAGNOSIS — I82402 Acute embolism and thrombosis of unspecified deep veins of left lower extremity: Secondary | ICD-10-CM

## 2016-06-23 DIAGNOSIS — R222 Localized swelling, mass and lump, trunk: Secondary | ICD-10-CM

## 2016-06-23 DIAGNOSIS — E785 Hyperlipidemia, unspecified: Secondary | ICD-10-CM | POA: Diagnosis not present

## 2016-06-23 DIAGNOSIS — M7989 Other specified soft tissue disorders: Secondary | ICD-10-CM | POA: Insufficient documentation

## 2016-06-23 DIAGNOSIS — M79661 Pain in right lower leg: Secondary | ICD-10-CM | POA: Diagnosis not present

## 2016-06-23 DIAGNOSIS — J9811 Atelectasis: Secondary | ICD-10-CM | POA: Diagnosis not present

## 2016-06-23 DIAGNOSIS — Z09 Encounter for follow-up examination after completed treatment for conditions other than malignant neoplasm: Secondary | ICD-10-CM

## 2016-06-23 DIAGNOSIS — R739 Hyperglycemia, unspecified: Secondary | ICD-10-CM

## 2016-06-23 DIAGNOSIS — J9859 Other diseases of mediastinum, not elsewhere classified: Secondary | ICD-10-CM

## 2016-06-23 DIAGNOSIS — I700 Atherosclerosis of aorta: Secondary | ICD-10-CM

## 2016-06-23 LAB — LIPID PANEL
CHOLESTEROL: 182 mg/dL (ref 0–200)
HDL: 35.2 mg/dL — ABNORMAL LOW (ref >39.00)
LDL Cholesterol: 118 mg/dL — ABNORMAL HIGH (ref 0–99)
NONHDL: 147.11
Total CHOL/HDL Ratio: 5
Triglycerides: 144 mg/dL (ref 0.0–149.0)
VLDL: 28.8 mg/dL (ref 0.0–40.0)

## 2016-06-23 LAB — BASIC METABOLIC PANEL
BUN: 12 mg/dL (ref 6–23)
CHLORIDE: 106 meq/L (ref 96–112)
CO2: 26 meq/L (ref 19–32)
CREATININE: 0.86 mg/dL (ref 0.40–1.20)
Calcium: 9.2 mg/dL (ref 8.4–10.5)
GFR: 71.62 mL/min (ref >60.00)
Glucose, Bld: 129 mg/dL — ABNORMAL HIGH (ref 70–99)
Potassium: 3.9 mEq/L (ref 3.5–5.1)
Sodium: 141 mEq/L (ref 135–145)

## 2016-06-23 LAB — HEMOGLOBIN A1C: HEMOGLOBIN A1C: 6 % (ref 4.6–6.5)

## 2016-06-23 NOTE — Progress Notes (Signed)
*  PRELIMINARY RESULTS* Vascular Ultrasound Bilateral lower extremity venous duplex has been completed.  Preliminary findings: No evidence of deep vein thrombosis or baker's cysts bilaterally.  Preliminary results called to Dr. Everrett Coombe office- given to [facility]. Patient is okay to go home.   Everrett Coombe 06/23/2016, 3:07 PM

## 2016-06-23 NOTE — Progress Notes (Signed)
[address]       [place] [address]             [phone]      Tami J Coudriet [facility] Medical Record [id number] Date of Birth: Nov 24, 1955  Referring: Deneise Lever, MD Primary Care: Scarlette Calico, MD  Chief Complaint:   POST OP FOLLOW UP 06/10/2016 OPERATIVE REPORT PREOPERATIVE DIAGNOSIS:  Anterior mediastinal mass. 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 PROCEDURE PERFORMED:  Bronchoscopy, right video-assisted thoracoscopy, resection of anterior mediastinal tumor SURGEON:  Lanelle Bal, MD. 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 - SEE COMMENT. Microscopic Comment Dr. Vicente Males has reviewed the case and concurs with this interpretation. (JBK:gt, 06/13/16) [name] MD  History of Present Illness:     Patient returns to the office after recent right video-assisted thoracoscopy and resection of anterior mediastinal tumor which on final path turned out to be a thymoma, at December resection there was no indication that this was a malignant process. The patient has had some irritation around the most anterior port site chest tube site and has been treating this locally/. She notes that she did fall at home once and has noted some soreness in her right calf but without pedal edema.     Past Medical History:  Diagnosis Date  . Arthritis    neck  . Asthma   . Chronic back pain   . Chronic bronchitis ([place])   . Chronic neck pain   . COPD (chronic obstructive pulmonary disease) (HCC)    Advair and Albuterol as needed as well as neb  . GERD (gastroesophageal reflux disease)    takes Nexium daily  . History of blood transfusion    in the 70's. No abnormal reaction noted  . History of colon polyps    benign  . Hyperlipidemia    not on any meds bc refused to pick up from pharmacy  . Joint pain   . Joint swelling   . Mediastinal  tumor   . Nocturia   . OCD (obsessive compulsive disorder)   . Panic disorder    takes Xanax daily as needed  . Pneumonia    2016  . PONV (postoperative nausea and vomiting)   . Seizures ([place])    in the 80's     History  Smoking Status  . Former Smoker  . Packs/day: 1.00  . Years: 20.00  . Types: Cigarettes  Smokeless Tobacco  . Never Used    Comment: quit smoking 28yrs ago    History  Alcohol Use No     Allergies  Allergen Reactions  . No Known Allergies     Current Outpatient Prescriptions  Medication Sig Dispense Refill  . albuterol (PROAIR HFA) 108 (90 Base) MCG/ACT inhaler Inhale 1-2 puffs into the lungs every 4 (four) hours as needed for wheezing or shortness of breath. 8.5 Inhaler 12  . albuterol (PROVENTIL) (2.5 MG/3ML) 0.083% nebulizer solution Take 3 mLs (2.5 mg total) by nebulization every 6 (six) hours. Dx COPD with bronchitis 75 mL 12  . ALPRAZolam (XANAX) 1 MG tablet TAKE 1 TABLET BY MOUTH EVERY 6 HOURS AS NEEDED 120 tablet 5  . aspirin EC 81 MG tablet Take 81 mg by mouth daily.    Marland Kitchen esomeprazole (NEXIUM) 40 MG capsule TAKE 1 CAPSULE (40 MG TOTAL) BY MOUTH DAILY. 30 capsule 6  . Fluticasone-Salmeterol (ADVAIR DISKUS) 500-50 MCG/DOSE AEPB  INHALE 1 PUFF BY MOUTH INTO THE LUNGS EVERY 12 HOURS (RINSE MOUTH AFTERWARDS) (Patient taking differently: INHALE 1 PUFF BY MOUTH INTO THE LUNGS EVERY 12 HOURS (RINSE MOUTH AFTERWARDS) AS NEEDED FOR WHEEZING OR SHORTNESS OF BREATH) 1 each 12  . HYDROcodone-acetaminophen (NORCO) 10-325 MG tablet Take 1 tablet by mouth every 6 (six) hours as needed (for pain). 120 tablet 0  . hydrocortisone 2.5 % cream Apply 1 application topically daily as needed (itching).     Marland Kitchen ibuprofen (ADVIL,MOTRIN) 800 MG tablet Take 800 mg by mouth 3 (three) times daily as needed for moderate pain.     . metoprolol tartrate (LOPRESSOR) 25 MG tablet Take 0.5 tablets (12.5 mg total) by mouth 2 (two) times daily. 30 tablet 1   No current  facility-administered medications for this visit.        Physical Exam: BP 125/72 (BP Location: Right Arm, Patient Position: Sitting, Cuff Size: Large)   Pulse 68   Resp 16   Ht 5' 6.5" (1.689 m)   Wt 193 lb (87.5 kg)   SpO2 97% Comment: RA  BMI 30.68 kg/m   General appearance: alert and cooperative Neurologic: intact Heart: regular rate and rhythm, S1, S2 normal, no murmur, click, rub or gallop Lungs: clear to auscultation bilaterally Abdomen: soft, non-tender; bowel sounds normal; no masses,  no organomegaly Extremities: extremities normal, atraumatic, no cyanosis or edema and Homans sign is negative, Patient has no edema in the right lower extremity, but has noticed has some slight calf tenderness Wound: Patient's anteriormost chest tube site has some separation there is no evidence of infection the wound is open less than 1 cm, she will continue packing this   Diagnostic Studies & Laboratory data:     Recent Radiology Findings:   Dg Chest 2 View  Result Date: 06/23/2016 CLINICAL DATA:  Status post resection of a mediastinal mass on November 17th. Patient reports chest tenderness. There is a history of asthma-COPD. EXAM: CHEST  2 VIEW COMPARISON:  PA and lateral chest x-ray of June 13, 2016 FINDINGS: The lungs are well-expanded. A small amount of atelectasis and fluid persists at the right lung base. There remains thickening in the minor fissure and perihilar atelectasis on the right persists. There is no alveolar infiltrate. There is no pneumothorax. There is no mediastinal shift. The heart and pulmonary vascularity are normal. There is calcification in the wall of the aortic arch. The observed bony thorax is unremarkable. IMPRESSION: Residual atelectasis and small amount of pleural fluid at the right lung base. Persistent right perihilar atelectasis. No pneumothorax. Thoracic aortic atherosclerosis. Electronically Signed   By: David  Martinique M.D.   On: 06/23/2016 09:31       Recent Lab Findings: Lab Results  Component Value Date   WBC 7.2 06/12/2016   HGB 12.1 06/12/2016   HCT 38.4 06/12/2016   PLT 216 06/12/2016   GLUCOSE 138 (H) 06/12/2016   CHOL 225 (H) 01/21/2015   TRIG 236.0 (H) 01/21/2015   HDL 46.90 01/21/2015   LDLDIRECT 142.0 01/21/2015   ALT 16 06/12/2016   AST 20 06/12/2016   NA 135 06/12/2016   K 4.2 06/12/2016   CL 102 06/12/2016   CREATININE 0.57 06/12/2016   BUN <5 (L) 06/12/2016   CO2 27 06/12/2016   TSH 1.73 01/21/2015   INR 0.89 06/08/2016   HGBA1C 5.7 01/21/2015      Assessment / Plan:     Patient stable after recent anterior mediastinal tumor resection via right VATS-  final path thymoma Minor superficial chest tube site opening no evidence of infection continue with local wound care Patient to have a venous duplex study of the lower extremities to rule out the possibility of DVT Plan to see the patient back for wound check in 2 weeks    Grace Isaac MD      [address] [place] [address] Office [phone]   Beeper [phone]  06/23/2016 10:03 AM

## 2016-06-24 ENCOUNTER — Encounter: Payer: Self-pay | Admitting: Internal Medicine

## 2016-06-24 LAB — THYROID PANEL WITH TSH
Free Thyroxine Index: 2.9 (ref 1.4–3.8)
T3 Uptake: 28 % (ref 22–35)
T4, Total: 10.4 ug/dL (ref 4.5–12.0)
TSH: 1.08 mIU/L

## 2016-07-06 ENCOUNTER — Other Ambulatory Visit: Payer: Self-pay | Admitting: Cardiothoracic Surgery

## 2016-07-06 DIAGNOSIS — D4989 Neoplasm of unspecified behavior of other specified sites: Secondary | ICD-10-CM

## 2016-07-06 DIAGNOSIS — D150 Benign neoplasm of thymus: Principal | ICD-10-CM

## 2016-07-07 ENCOUNTER — Ambulatory Visit: Payer: Medicare Other | Admitting: Cardiothoracic Surgery

## 2016-07-07 ENCOUNTER — Ambulatory Visit
Admission: RE | Admit: 2016-07-07 | Discharge: 2016-07-07 | Disposition: A | Discharge status: Home / Self Care | Payer: Medicare Other | Source: Ambulatory Visit | Attending: Cardiothoracic Surgery | Admitting: Cardiothoracic Surgery

## 2016-07-07 ENCOUNTER — Encounter: Payer: Self-pay | Admitting: Cardiothoracic Surgery

## 2016-07-07 ENCOUNTER — Ambulatory Visit (INDEPENDENT_AMBULATORY_CARE_PROVIDER_SITE_OTHER): Payer: Self-pay | Admitting: Cardiothoracic Surgery

## 2016-07-07 VITALS — BP 102/64 | HR 63 | Resp 20 | Ht 66.0 in | Wt 193.0 lb

## 2016-07-07 DIAGNOSIS — Z09 Encounter for follow-up examination after completed treatment for conditions other than malignant neoplasm: Secondary | ICD-10-CM

## 2016-07-07 DIAGNOSIS — D150 Benign neoplasm of thymus: Principal | ICD-10-CM

## 2016-07-07 DIAGNOSIS — J9859 Other diseases of mediastinum, not elsewhere classified: Secondary | ICD-10-CM

## 2016-07-07 DIAGNOSIS — D4989 Neoplasm of unspecified behavior of other specified sites: Secondary | ICD-10-CM

## 2016-07-07 DIAGNOSIS — R918 Other nonspecific abnormal finding of lung field: Secondary | ICD-10-CM | POA: Diagnosis not present

## 2016-07-07 NOTE — Progress Notes (Signed)
[address]       [place] [address]             [phone]      Felicia Odom [facility] Medical Record [id number] Date of Birth: 03-26-1956  Referring: [name], Odom Primary Care: Scarlette Calico, Odom  Chief Complaint:   POST OP FOLLOW UP 06/10/2016 OPERATIVE REPORT PREOPERATIVE DIAGNOSIS:  Anterior mediastinal mass. 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 PROCEDURE PERFORMED:  Bronchoscopy, right video-assisted thoracoscopy, resection of anterior mediastinal tumor SURGEON:  Lanelle Bal, Odom. 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 - SEE COMMENT. Microscopic Comment Dr. Vicente Males has reviewed the case and concurs with this interpretation. (JBK:gt, 06/13/16) Felicia Odom  History of Present Illness:     Patient returns to the office after recent right video-assisted thoracoscopy and resection of anterior mediastinal tumor which on final path turned out to be a thymoma, at time of resection there was no indication that this was a malignant process. Patient is back almost completely to usual activities. This discomfort in her legs is resolved, after her previous visit were extremity venous Dopplers were done to rule out possibility of DVT this was negative.     Past Medical History:  Diagnosis Date  . Arthritis    neck  . Asthma   . Chronic back pain   . Chronic bronchitis ([place])   . Chronic neck pain   . COPD (chronic obstructive pulmonary disease) (HCC)    Advair and Albuterol as needed as well as neb  . GERD (gastroesophageal reflux disease)    takes Nexium daily  . History of blood transfusion    in the 70's. No abnormal reaction noted  . History of colon polyps    benign  . Hyperlipidemia    not on any meds bc refused to pick up from pharmacy  . Joint pain   . Joint swelling   . Mediastinal tumor   . Nocturia   .  OCD (obsessive compulsive disorder)   . Panic disorder    takes Xanax daily as needed  . Pneumonia    2016  . PONV (postoperative nausea and vomiting)   . Seizures ([place])    in the 80's     History  Smoking Status  . Former Smoker  . Packs/day: 1.00  . Years: 20.00  . Types: Cigarettes  Smokeless Tobacco  . Never Used    Comment: quit smoking 58yrs ago    History  Alcohol Use No     Allergies  Allergen Reactions  . No Known Allergies     Current Outpatient Prescriptions  Medication Sig Dispense Refill  . albuterol (PROAIR HFA) 108 (90 Base) MCG/ACT inhaler Inhale 1-2 puffs into the lungs every 4 (four) hours as needed for wheezing or shortness of breath. 8.5 Inhaler 12  . albuterol (PROVENTIL) (2.5 MG/3ML) 0.083% nebulizer solution Take 3 mLs (2.5 mg total) by nebulization every 6 (six) hours. Dx COPD with bronchitis 75 mL 12  . ALPRAZolam (XANAX) 1 MG tablet TAKE 1 TABLET BY MOUTH EVERY 6 HOURS AS NEEDED 120 tablet 5  . aspirin EC 81 MG tablet Take 81 mg by mouth daily.    Marland Kitchen esomeprazole (NEXIUM) 40 MG capsule TAKE 1 CAPSULE (40 MG TOTAL) BY MOUTH DAILY. 30 capsule 6  . Fluticasone-Salmeterol (ADVAIR DISKUS) 500-50 MCG/DOSE AEPB INHALE 1 PUFF BY MOUTH INTO THE LUNGS  EVERY 12 HOURS (RINSE MOUTH AFTERWARDS) (Patient taking differently: INHALE 1 PUFF BY MOUTH INTO THE LUNGS EVERY 12 HOURS (RINSE MOUTH AFTERWARDS) AS NEEDED FOR WHEEZING OR SHORTNESS OF BREATH) 1 each 12  . HYDROcodone-acetaminophen (NORCO) 10-325 MG tablet Take 1 tablet by mouth every 6 (six) hours as needed (for pain). 120 tablet 0  . hydrocortisone 2.5 % cream Apply 1 application topically daily as needed (itching).     Marland Kitchen ibuprofen (ADVIL,MOTRIN) 800 MG tablet Take 800 mg by mouth 3 (three) times daily as needed for moderate pain.     . metoprolol tartrate (LOPRESSOR) 25 MG tablet Take 0.5 tablets (12.5 mg total) by mouth 2 (two) times daily. 30 tablet 1   No current facility-administered medications for  this visit.        Physical Exam: BP 102/64   Pulse 63   Resp 20   Ht 5\' 6"  (1.676 m)   Wt 193 lb (87.5 kg)   SpO2 97% Comment: RA  BMI 31.15 kg/m   General appearance: alert and cooperative Neurologic: intact Heart: regular rate and rhythm, S1, S2 normal, no murmur, click, rub or gallop Lungs: clear to auscultation bilaterally Abdomen: soft, non-tender; bowel sounds normal; no masses,  no organomegaly Extremities: extremities normal, atraumatic, no cyanosis or edema and Homans sign is negative, Patient has no edema in the right lower extremity, but has noticed has some slight calf tenderness Wound: Patient's anteriormost chest tube site has completely closed, there is no evidence of port site infections at this time.  Diagnostic Studies & Laboratory data:     Recent Radiology Findings:   Dg Chest 2 View  Result Date: 07/07/2016 CLINICAL DATA:  Post right video assisted thoracotomy 11/17/ 17 EXAM: CHEST  2 VIEW COMPARISON:  06/23/2016 FINDINGS: Cardiomediastinal silhouette is stable. Stable right perihilar atelectasis or scarring. No infiltrate or pulmonary edema. Mild degenerative changes thoracic spine. IMPRESSION: Stable right perihilar atelectasis or scarring. No superimposed infiltrate or pulmonary edema. Electronically Signed   By: Lahoma Crocker M.D.   On: 07/07/2016 09:55   ------------------------------------------------------------------- Summary:  - No evidence of deep vein thrombosis involving the visualized   veins of the lower extremities. - No evidence of Baker&'s cyst on the right or left.  Other specific details can be found in the table(s) above. Prepared and Electronically Authenticated by  Adele Barthel, Odom 2017-11-30T19:32:24    Recent Lab Findings: Lab Results  Component Value Date   WBC 7.2 06/12/2016   HGB 12.1 06/12/2016   HCT 38.4 06/12/2016   PLT 216 06/12/2016   GLUCOSE 129 (H) 06/23/2016   CHOL 182 06/23/2016   TRIG 144.0 06/23/2016    HDL 35.20 (L) 06/23/2016   LDLDIRECT 142.0 01/21/2015   LDLCALC 118 (H) 06/23/2016   ALT 16 06/12/2016   AST 20 06/12/2016   NA 141 06/23/2016   K 3.9 06/23/2016   CL 106 06/23/2016   CREATININE 0.86 06/23/2016   BUN 12 06/23/2016   CO2 26 06/23/2016   TSH 1.08 06/23/2016   INR 0.89 06/08/2016   HGBA1C 6.0 06/23/2016      Assessment / Plan:     Patient stable after recent anterior mediastinal tumor resection via right VATS- final path thymoma- Presented at the multidisciplinary thoracic oncology conference and the consensus was that no further therapy other than observation was indicated. Plan to see back in 3 months with a follow-up chest x-ray.   Grace Isaac Odom      [address] [place] [address] Office [phone]   Beeper [phone]  07/07/2016 10:21 AM

## 2016-07-13 DIAGNOSIS — J449 Chronic obstructive pulmonary disease, unspecified: Secondary | ICD-10-CM | POA: Diagnosis not present

## 2016-07-14 ENCOUNTER — Ambulatory Visit (INDEPENDENT_AMBULATORY_CARE_PROVIDER_SITE_OTHER): Payer: Medicare Other | Admitting: Internal Medicine

## 2016-07-14 VITALS — BP 120/70 | HR 76 | Temp 97.6°F | Resp 16 | Ht 66.0 in | Wt 194.0 lb

## 2016-07-14 DIAGNOSIS — M150 Primary generalized (osteo)arthritis: Secondary | ICD-10-CM | POA: Diagnosis not present

## 2016-07-14 DIAGNOSIS — I10 Essential (primary) hypertension: Secondary | ICD-10-CM | POA: Diagnosis not present

## 2016-07-14 DIAGNOSIS — M5030 Other cervical disc degeneration, unspecified cervical region: Secondary | ICD-10-CM

## 2016-07-14 DIAGNOSIS — M159 Polyosteoarthritis, unspecified: Secondary | ICD-10-CM

## 2016-07-14 MED ORDER — HYDROCODONE-ACETAMINOPHEN 10-325 MG PO TABS: 1.0000 | ORAL_TABLET | Freq: Four times a day (QID) | ORAL | 0 refills | Status: DC | PRN

## 2016-07-14 NOTE — Progress Notes (Signed)
Subjective:  Patient ID: Felicia Odom, female    DOB: [birth year]  Age: 60 y.o. MRN: [id number]  CC: Hypertension   HPI Felicia Odom presents for a BP check - she stopped taking metoprolol about 5 days ago because she does not think she needs it anymore. She has felt well since stopping it with no episodes of palpitations, chest pain, headache, blurred vision, shortness of breath, edema, or fatigue.  Outpatient Medications Prior to Visit  Medication Sig Dispense Refill  . albuterol (PROAIR HFA) 108 (90 Base) MCG/ACT inhaler Inhale 1-2 puffs into the lungs every 4 (four) hours as needed for wheezing or shortness of breath. 8.5 Inhaler 12  . albuterol (PROVENTIL) (2.5 MG/3ML) 0.083% nebulizer solution Take 3 mLs (2.5 mg total) by nebulization every 6 (six) hours. Dx COPD with bronchitis 75 mL 12  . ALPRAZolam (XANAX) 1 MG tablet TAKE 1 TABLET BY MOUTH EVERY 6 HOURS AS NEEDED 120 tablet 5  . aspirin EC 81 MG tablet Take 81 mg by mouth daily.    Marland Kitchen esomeprazole (NEXIUM) 40 MG capsule TAKE 1 CAPSULE (40 MG TOTAL) BY MOUTH DAILY. 30 capsule 6  . Fluticasone-Salmeterol (ADVAIR DISKUS) 500-50 MCG/DOSE AEPB INHALE 1 PUFF BY MOUTH INTO THE LUNGS EVERY 12 HOURS (RINSE MOUTH AFTERWARDS) (Patient taking differently: INHALE 1 PUFF BY MOUTH INTO THE LUNGS EVERY 12 HOURS (RINSE MOUTH AFTERWARDS) AS NEEDED FOR WHEEZING OR SHORTNESS OF BREATH) 1 each 12  . hydrocortisone 2.5 % cream Apply 1 application topically daily as needed (itching).     Marland Kitchen ibuprofen (ADVIL,MOTRIN) 800 MG tablet Take 800 mg by mouth 3 (three) times daily as needed for moderate pain.     Marland Kitchen HYDROcodone-acetaminophen (NORCO) 10-325 MG tablet Take 1 tablet by mouth every 6 (six) hours as needed (for pain). 120 tablet 0  . metoprolol tartrate (LOPRESSOR) 25 MG tablet Take 0.5 tablets (12.5 mg total) by mouth 2 (two) times daily. 30 tablet 1   No facility-administered medications prior to visit.     ROS Review of Systems    Constitutional: Negative.  Negative for diaphoresis, fatigue and unexpected weight change.  HENT: Negative.   Eyes: Negative for visual disturbance.  Respiratory: Negative for cough, chest tightness, shortness of breath, wheezing and stridor.   Cardiovascular: Negative for chest pain, palpitations and leg swelling.  Gastrointestinal: Negative for abdominal pain, constipation, diarrhea, nausea and vomiting.  Endocrine: Negative.   Genitourinary: Negative.   Musculoskeletal: Positive for arthralgias and neck pain. Negative for back pain, gait problem and joint swelling.  Skin: Negative.   Allergic/Immunologic: Negative.   Neurological: Negative.  Negative for dizziness, weakness, light-headedness and headaches.  Hematological: Negative.  Negative for adenopathy. Does not bruise/bleed easily.  Psychiatric/Behavioral: Negative for behavioral problems, decreased concentration, dysphoric mood, self-injury, sleep disturbance and suicidal ideas. The patient is nervous/anxious.     Objective:  BP 120/70 (BP Location: Left Arm, Patient Position: Sitting, Cuff Size: Normal)   Pulse 76   Temp 97.6 F (36.4 C) (Oral)   Resp 16   Ht 5\' 6"  (1.676 m)   Wt 194 lb (88 kg)   SpO2 97%   BMI 31.31 kg/m   BP Readings from Last 3 Encounters:  07/14/16 120/70  07/07/16 102/64  06/23/16 125/72    Wt Readings from Last 3 Encounters:  07/14/16 194 lb (88 kg)  07/07/16 193 lb (87.5 kg)  06/23/16 193 lb (87.5 kg)    Physical Exam  Constitutional: She is oriented to person, place,  and time.  HENT:  Mouth/Throat: Oropharynx is clear and moist. No oropharyngeal exudate.  Eyes: Conjunctivae are normal. Right eye exhibits no discharge. Left eye exhibits no discharge. No scleral icterus.  Neck: Normal range of motion. Neck supple. No JVD present. No tracheal deviation present. No thyromegaly present.  Cardiovascular: Normal rate, regular rhythm, normal heart sounds and intact distal pulses.  Exam  reveals no gallop and no friction rub.   No murmur heard. Pulmonary/Chest: Effort normal and breath sounds normal. No stridor. No respiratory distress. She has no wheezes. She has no rales. She exhibits no tenderness.  Abdominal: Soft. Bowel sounds are normal. She exhibits no distension. There is no tenderness. There is no rebound and no guarding.  Musculoskeletal: Normal range of motion. She exhibits no edema, tenderness or deformity.  Lymphadenopathy:    She has no cervical adenopathy.  Neurological: She is oriented to person, place, and time.  Skin: Skin is warm and dry. No rash noted. She is not diaphoretic. No erythema. No pallor.  Psychiatric: She has a normal mood and affect. Her behavior is normal. Judgment and thought content normal.  Vitals reviewed.   Lab Results  Component Value Date   WBC 7.2 06/12/2016   HGB 12.1 06/12/2016   HCT 38.4 06/12/2016   PLT 216 06/12/2016   GLUCOSE 129 (H) 06/23/2016   CHOL 182 06/23/2016   TRIG 144.0 06/23/2016   HDL 35.20 (L) 06/23/2016   LDLDIRECT 142.0 01/21/2015   LDLCALC 118 (H) 06/23/2016   ALT 16 06/12/2016   AST 20 06/12/2016   NA 141 06/23/2016   K 3.9 06/23/2016   CL 106 06/23/2016   CREATININE 0.86 06/23/2016   BUN 12 06/23/2016   CO2 26 06/23/2016   TSH 1.08 06/23/2016   INR 0.89 06/08/2016   HGBA1C 6.0 06/23/2016    Dg Chest 2 View  Result Date: 07/07/2016 CLINICAL DATA:  Post right video assisted thoracotomy 11/17/ 17 EXAM: CHEST  2 VIEW COMPARISON:  06/23/2016 FINDINGS: Cardiomediastinal silhouette is stable. Stable right perihilar atelectasis or scarring. No infiltrate or pulmonary edema. Mild degenerative changes thoracic spine. IMPRESSION: Stable right perihilar atelectasis or scarring. No superimposed infiltrate or pulmonary edema. Electronically Signed   By: Lahoma Crocker M.D.   On: 07/07/2016 09:55    Assessment & Plan:   Yakelin was seen today for hypertension.  Diagnoses and all orders for this  visit:  Primary osteoarthritis involving multiple joints -     HYDROcodone-acetaminophen (NORCO) 10-325 MG tablet; Take 1 tablet by mouth every 6 (six) hours as needed (for pain).  Essential hypertension- she stopped taking the beta blocker 5 days ago and has had no symptoms of withdrawal. Her blood pressure is well controlled so I do not recommend an antihypertensive at this time.  DDD (degenerative disc disease), cervical -     HYDROcodone-acetaminophen (NORCO) 10-325 MG tablet; Take 1 tablet by mouth every 6 (six) hours as needed (for pain).   I have discontinued Ms. Honeyman's metoprolol tartrate. I am also having her maintain her esomeprazole, albuterol, Fluticasone-Salmeterol, albuterol, ALPRAZolam, aspirin EC, ibuprofen, hydrocortisone, and HYDROcodone-acetaminophen.  Meds ordered this encounter  Medications  . HYDROcodone-acetaminophen (NORCO) 10-325 MG tablet    Sig: Take 1 tablet by mouth every 6 (six) hours as needed (for pain).    Dispense:  120 tablet    Refill:  0     Follow-up: Return in about 3 months (around 10/12/2016).  Scarlette Calico, MD

## 2016-07-14 NOTE — Progress Notes (Signed)
Pre visit review using our clinic review tool, if applicable. No additional management support is needed unless otherwise documented below in the visit note. 

## 2016-07-14 NOTE — Patient Instructions (Signed)
Hypertension Hypertension, commonly called high blood pressure, is when the force of blood pumping through your arteries is too strong. Your arteries are the blood vessels that carry blood from your heart throughout your body. A blood pressure reading consists of a higher number over a lower number, such as 110/72. The higher number (systolic) is the pressure inside your arteries when your heart pumps. The lower number (diastolic) is the pressure inside your arteries when your heart relaxes. Ideally you want your blood pressure below 120/80. Hypertension forces your heart to work harder to pump blood. Your arteries may become narrow or stiff. Having untreated or uncontrolled hypertension can cause heart attack, stroke, kidney disease, and other problems. What increases the risk? Some risk factors for high blood pressure are controllable. Others are not. Risk factors you cannot control include:  Race. You may be at higher risk if you are African American.  Age. Risk increases with age.  Gender. Men are at higher risk than women before age 45 years. After age 65, women are at higher risk than men. Risk factors you can control include:  Not getting enough exercise or physical activity.  Being overweight.  Getting too much fat, sugar, calories, or salt in your diet.  Drinking too much alcohol. What are the signs or symptoms? Hypertension does not usually cause signs or symptoms. Extremely high blood pressure (hypertensive crisis) may cause headache, anxiety, shortness of breath, and nosebleed. How is this diagnosed? To check if you have hypertension, your health care provider will measure your blood pressure while you are seated, with your arm held at the level of your heart. It should be measured at least twice using the same arm. Certain conditions can cause a difference in blood pressure between your right and left arms. A blood pressure reading that is higher than normal on one occasion does  not mean that you need treatment. If it is not clear whether you have high blood pressure, you may be asked to return on a different day to have your blood pressure checked again. Or, you may be asked to monitor your blood pressure at home for 1 or more weeks. How is this treated? Treating high blood pressure includes making lifestyle changes and possibly taking medicine. Living a healthy lifestyle can help lower high blood pressure. You may need to change some of your habits. Lifestyle changes may include:  Following the DASH diet. This diet is high in fruits, vegetables, and whole grains. It is low in salt, red meat, and added sugars.  Keep your sodium intake below 2,300 mg per day.  Getting at least 30-45 minutes of aerobic exercise at least 4 times per week.  Losing weight if necessary.  Not smoking.  Limiting alcoholic beverages.  Learning ways to reduce stress. Your health care provider may prescribe medicine if lifestyle changes are not enough to get your blood pressure under control, and if one of the following is true:  You are 18-59 years of age and your systolic blood pressure is above 140.  You are 60 years of age or older, and your systolic blood pressure is above 150.  Your diastolic blood pressure is above 90.  You have diabetes, and your systolic blood pressure is over 140 or your diastolic blood pressure is over 90.  You have kidney disease and your blood pressure is above 140/90.  You have heart disease and your blood pressure is above 140/90. Your personal target blood pressure may vary depending on your medical   conditions, your age, and other factors. Follow these instructions at home:  Have your blood pressure rechecked as directed by your health care provider.  Take medicines only as directed by your health care provider. Follow the directions carefully. Blood pressure medicines must be taken as prescribed. The medicine does not work as well when you skip  doses. Skipping doses also puts you at risk for problems.  Do not smoke.  Monitor your blood pressure at home as directed by your health care provider. Contact a health care provider if:  You think you are having a reaction to medicines taken.  You have recurrent headaches or feel dizzy.  You have swelling in your ankles.  You have trouble with your vision. Get help right away if:  You develop a severe headache or confusion.  You have unusual weakness, numbness, or feel faint.  You have severe chest or abdominal pain.  You vomit repeatedly.  You have trouble breathing. This information is not intended to replace advice given to you by your health care provider. Make sure you discuss any questions you have with your health care provider. Document Released: 07/11/2005 Document Revised: 12/17/2015 Document Reviewed: 05/03/2013 Elsevier Interactive Patient Education  2017 [employer]vier Inc[employer]  

## 2016-07-15 ENCOUNTER — Encounter: Payer: Self-pay | Admitting: Internal Medicine

## 2016-07-15 DIAGNOSIS — Z79891 Long term (current) use of opiate analgesic: Secondary | ICD-10-CM | POA: Diagnosis not present

## 2016-07-26 ENCOUNTER — Other Ambulatory Visit: Payer: Self-pay | Admitting: Internal Medicine

## 2016-07-26 DIAGNOSIS — F1220 Cannabis dependence, uncomplicated: Secondary | ICD-10-CM

## 2016-07-26 HISTORY — DX: Cannabis dependence, uncomplicated: F12.20

## 2016-07-28 ENCOUNTER — Telehealth: Payer: Self-pay

## 2016-07-28 ENCOUNTER — Telehealth: Payer: Self-pay | Admitting: Internal Medicine

## 2016-07-28 MED ORDER — PROMETHAZINE-CODEINE 6.25-10 MG/5ML PO SYRP: 5.0000 mL | ORAL_SOLUTION | Freq: Four times a day (QID) | ORAL | 0 refills | Status: DC | PRN

## 2016-07-28 MED ORDER — AZITHROMYCIN 250 MG PO TABS: ORAL_TABLET | ORAL | 0 refills | Status: AC

## 2016-07-28 NOTE — Telephone Encounter (Signed)
Spoke with pt. She is aware of CY's recommendations. Rxs have been sent/called in. Nothing further was needed.

## 2016-07-28 NOTE — Telephone Encounter (Signed)
Pt submitted urine for UDS.   Screening came back positive for THC, opiates and benzo's.   Per note from Dr. Ronnald Ramp,  "her uds was positive for marijuana, this complicates her medical regimen, I will start to taper her 2 narcotic prescriptions".   Called pt and gave her MD comments regarding uds.  Pt stated it came back positive because her niece was smoking it in the house. Pt stated that it will not happen again because her niece has moved out.   Pt wants to talk to PCP more about it at her March appt.

## 2016-07-28 NOTE — Telephone Encounter (Signed)
Ok Zpak   # 6, 2 today thein one daily        prometh codeine cough syrup 200 ml,    5 ml every 6 hours if needed for cough

## 2016-07-28 NOTE — Telephone Encounter (Signed)
Spoke with pt. States that she is not feeling well. Reports runny nose, cough, chest tightness, wheezing and SOB. Cough is non productive. Denies fever. Symptoms started 3-4 days ago. Pt would like to have a Zpack and cough syrup sent to her pharmacy. CY - please advise. Thanks.  Allergies  Allergen Reactions  . No Known Allergies    Current Outpatient Prescriptions on File Prior to Visit  Medication Sig Dispense Refill  . albuterol (PROAIR HFA) 108 (90 Base) MCG/ACT inhaler Inhale 1-2 puffs into the lungs every 4 (four) hours as needed for wheezing or shortness of breath. 8.5 Inhaler 12  . albuterol (PROVENTIL) (2.5 MG/3ML) 0.083% nebulizer solution Take 3 mLs (2.5 mg total) by nebulization every 6 (six) hours. Dx COPD with bronchitis 75 mL 12  . ALPRAZolam (XANAX) 1 MG tablet TAKE 1 TABLET BY MOUTH EVERY 6 HOURS AS NEEDED 120 tablet 5  . aspirin EC 81 MG tablet Take 81 mg by mouth daily.    Marland Kitchen esomeprazole (NEXIUM) 40 MG capsule TAKE 1 CAPSULE (40 MG TOTAL) BY MOUTH DAILY. 30 capsule 6  . Fluticasone-Salmeterol (ADVAIR DISKUS) 500-50 MCG/DOSE AEPB INHALE 1 PUFF BY MOUTH INTO THE LUNGS EVERY 12 HOURS (RINSE MOUTH AFTERWARDS) (Patient taking differently: INHALE 1 PUFF BY MOUTH INTO THE LUNGS EVERY 12 HOURS (RINSE MOUTH AFTERWARDS) AS NEEDED FOR WHEEZING OR SHORTNESS OF BREATH) 1 each 12  . HYDROcodone-acetaminophen (NORCO) 10-325 MG tablet Take 1 tablet by mouth every 6 (six) hours as needed (for pain). 120 tablet 0  . hydrocortisone 2.5 % cream Apply 1 application topically daily as needed (itching).     Marland Kitchen ibuprofen (ADVIL,MOTRIN) 800 MG tablet Take 800 mg by mouth 3 (three) times daily as needed for moderate pain.     . [DISCONTINUED] estradiol (VIVELLE-DOT) 0.0375 MG/24HR Place 1 patch onto the skin 2 (two) times a week.      . [DISCONTINUED] ipratropium (ATROVENT) 0.02 % nebulizer solution Take 2.5 mLs (500 mcg total) by nebulization 4 (four) times daily. DX:  496 300 mL 5  .  [DISCONTINUED] progesterone (PROMETRIUM) 100 MG capsule Take 100 mg by mouth daily.       No current facility-administered medications on file prior to visit.

## 2016-08-12 ENCOUNTER — Telehealth: Payer: Self-pay | Admitting: Internal Medicine

## 2016-08-12 NOTE — Telephone Encounter (Signed)
Attempted to contact pt. No answer, no option to leave a message. Will try back.  

## 2016-08-15 DIAGNOSIS — J449 Chronic obstructive pulmonary disease, unspecified: Secondary | ICD-10-CM | POA: Diagnosis not present

## 2016-08-15 MED ORDER — ALBUTEROL SULFATE (2.5 MG/3ML) 0.083% IN NEBU: 2.5000 mg | INHALATION_SOLUTION | Freq: Four times a day (QID) | RESPIRATORY_TRACT | 5 refills | Status: DC

## 2016-08-15 NOTE — Telephone Encounter (Signed)
Called and spoke with pt and she stated that [facility] has been trying to reach [place] for the last 2 days.  I have called the [facility] and they stated that the rx that was sent over for the albuterol was out of date and could not be used.  I gave a verbal over the phone and they will fax form for CY to sign to ok this as well. Nothing further is needed.

## 2016-08-16 ENCOUNTER — Ambulatory Visit (INDEPENDENT_AMBULATORY_CARE_PROVIDER_SITE_OTHER): Payer: 59 | Admitting: Internal Medicine

## 2016-08-16 VITALS — BP 142/82 | HR 80 | Temp 98.1°F | Resp 16 | Ht 66.0 in | Wt 196.0 lb

## 2016-08-16 DIAGNOSIS — M5030 Other cervical disc degeneration, unspecified cervical region: Secondary | ICD-10-CM | POA: Diagnosis not present

## 2016-08-16 DIAGNOSIS — F422 Mixed obsessional thoughts and acts: Secondary | ICD-10-CM | POA: Diagnosis not present

## 2016-08-16 DIAGNOSIS — M150 Primary generalized (osteo)arthritis: Secondary | ICD-10-CM

## 2016-08-16 DIAGNOSIS — M159 Polyosteoarthritis, unspecified: Secondary | ICD-10-CM

## 2016-08-16 DIAGNOSIS — M1711 Unilateral primary osteoarthritis, right knee: Secondary | ICD-10-CM | POA: Insufficient documentation

## 2016-08-16 MED ORDER — FLUVOXAMINE MALEATE 25 MG PO TABS: 25.0000 mg | ORAL_TABLET | Freq: Every day | ORAL | 1 refills | Status: DC

## 2016-08-16 MED ORDER — HYDROCODONE-ACETAMINOPHEN 10-325 MG PO TABS: 1.0000 | ORAL_TABLET | Freq: Four times a day (QID) | ORAL | 0 refills | Status: DC | PRN

## 2016-08-16 NOTE — Progress Notes (Signed)
Pre visit review using our clinic review tool, if applicable. No additional management support is needed unless otherwise documented below in the visit note. 

## 2016-08-16 NOTE — Progress Notes (Signed)
Subjective:  Patient ID: Felicia Odom, female    DOB: 1956/02/11  Age: 61 y.o. MRN: [id number]  CC: Osteoarthritis   HPI Felicia Odom presents for Follow-up on chronic pain management. Her last urine drug screen was positive for marijuana side advised her that I will start tapering her dose of opiates and benzo total void complications from multiple narcotics. She complains of chronic knee pain that has been worsening over the right knee for the last few weeks. She gets adequate symptom relief with Norco.  She also complains of obsessive thoughts and thinking and wants to try a medication to help treat this. She also suffers from chronic dysthymia and anxiety. She tells me that her current dose of Xanax helps. She denies SI, HI, hopelessness, helplessness, or weight changes.  Outpatient Medications Prior to Visit  Medication Sig Dispense Refill  . albuterol (PROAIR HFA) 108 (90 Base) MCG/ACT inhaler Inhale 1-2 puffs into the lungs every 4 (four) hours as needed for wheezing or shortness of breath. 8.5 Inhaler 12  . albuterol (PROVENTIL) (2.5 MG/3ML) 0.083% nebulizer solution Take 3 mLs (2.5 mg total) by nebulization every 6 (six) hours. Dx COPD with bronchitis 120 mL 5  . ALPRAZolam (XANAX) 1 MG tablet TAKE 1 TABLET BY MOUTH EVERY 6 HOURS AS NEEDED 120 tablet 5  . aspirin EC 81 MG tablet Take 81 mg by mouth daily.    Marland Kitchen esomeprazole (NEXIUM) 40 MG capsule TAKE 1 CAPSULE (40 MG TOTAL) BY MOUTH DAILY. 30 capsule 6  . Fluticasone-Salmeterol (ADVAIR DISKUS) 500-50 MCG/DOSE AEPB INHALE 1 PUFF BY MOUTH INTO THE LUNGS EVERY 12 HOURS (RINSE MOUTH AFTERWARDS) (Patient taking differently: INHALE 1 PUFF BY MOUTH INTO THE LUNGS EVERY 12 HOURS (RINSE MOUTH AFTERWARDS) AS NEEDED FOR WHEEZING OR SHORTNESS OF BREATH) 1 each 12  . hydrocortisone 2.5 % cream Apply 1 application topically daily as needed (itching).     Marland Kitchen ibuprofen (ADVIL,MOTRIN) 800 MG tablet Take 800 mg by mouth 3 (three) times  daily as needed for moderate pain.     Marland Kitchen HYDROcodone-acetaminophen (NORCO) 10-325 MG tablet Take 1 tablet by mouth every 6 (six) hours as needed (for pain). 120 tablet 0  . promethazine-codeine (PHENERGAN WITH CODEINE) 6.25-10 MG/5ML syrup Take 5 mLs by mouth every 6 (six) hours as needed for cough. 200 mL 0   No facility-administered medications prior to visit.     ROS Review of Systems  Constitutional: Negative for activity change, appetite change, diaphoresis, fatigue and unexpected weight change.  HENT: Negative.   Eyes: Negative.  Negative for visual disturbance.  Respiratory: Negative for cough, chest tightness, shortness of breath and wheezing.   Cardiovascular: Negative for chest pain, palpitations and leg swelling.  Gastrointestinal: Negative for abdominal pain, constipation, diarrhea, nausea and vomiting.  Endocrine: Negative.   Genitourinary: Negative.  Negative for difficulty urinating and dysuria.  Musculoskeletal: Positive for arthralgias. Negative for joint swelling and myalgias.  Skin: Negative.  Negative for color change and rash.  Allergic/Immunologic: Negative.   Neurological: Negative.  Negative for dizziness, weakness, numbness and headaches.  Hematological: Negative for adenopathy. Does not bruise/bleed easily.  Psychiatric/Behavioral: Positive for dysphoric mood. Negative for agitation, behavioral problems, confusion, hallucinations, self-injury, sleep disturbance and suicidal ideas. The patient is nervous/anxious. The patient is not hyperactive.     Objective:  BP (!) 142/82 (BP Location: Left Arm, Patient Position: Sitting, Cuff Size: Normal)   Pulse 80   Temp 98.1 F (36.7 C) (Oral)   Resp 16  Ht 5\' 6"  (1.676 m)   Wt 196 lb 0.6 oz (88.9 kg)   SpO2 97%   BMI 31.64 kg/m   BP Readings from Last 3 Encounters:  08/16/16 (!) 142/82  07/14/16 120/70  07/07/16 102/64    Wt Readings from Last 3 Encounters:  08/16/16 196 lb 0.6 oz (88.9 kg)  07/14/16 194  lb (88 kg)  07/07/16 193 lb (87.5 kg)    Physical Exam  Constitutional: She is oriented to person, place, and time. No distress.  HENT:  Mouth/Throat: Oropharynx is clear and moist. No oropharyngeal exudate.  Eyes: Conjunctivae are normal. Right eye exhibits no discharge. Left eye exhibits no discharge. No scleral icterus.  Neck: Normal range of motion. Neck supple. No JVD present. No tracheal deviation present. No thyromegaly present.  Cardiovascular: Normal rate, regular rhythm, normal heart sounds and intact distal pulses.  Exam reveals no gallop and no friction rub.   No murmur heard. Pulmonary/Chest: Effort normal and breath sounds normal. No stridor. No respiratory distress. She has no wheezes. She has no rales. She exhibits no tenderness.  Abdominal: Soft. Bowel sounds are normal. She exhibits no distension and no mass. There is no tenderness. There is no rebound and no guarding.  Musculoskeletal: Normal range of motion. She exhibits no edema or tenderness.       Right knee: She exhibits deformity (DJD). She exhibits normal range of motion, no swelling, no effusion and no erythema. No tenderness found.  Lymphadenopathy:    She has no cervical adenopathy.  Neurological: She is oriented to person, place, and time.  Skin: Skin is warm and dry. No rash noted. She is not diaphoretic. No erythema. No pallor.  Psychiatric: Judgment normal. Her mood appears anxious. Her affect is not angry. Her speech is not rapid and/or pressured, not delayed and not tangential. She is not agitated, not aggressive, not slowed and not withdrawn. Cognition and memory are normal. She exhibits a depressed mood. She expresses no homicidal and no suicidal ideation. She expresses no suicidal plans and no homicidal plans.  She is tearful and anxious She is attentive.  Vitals reviewed.   Lab Results  Component Value Date   WBC 7.2 06/12/2016   HGB 12.1 06/12/2016   HCT 38.4 06/12/2016   PLT 216 06/12/2016    GLUCOSE 129 (H) 06/23/2016   CHOL 182 06/23/2016   TRIG 144.0 06/23/2016   HDL 35.20 (L) 06/23/2016   LDLDIRECT 142.0 01/21/2015   LDLCALC 118 (H) 06/23/2016   ALT 16 06/12/2016   AST 20 06/12/2016   NA 141 06/23/2016   K 3.9 06/23/2016   CL 106 06/23/2016   CREATININE 0.86 06/23/2016   BUN 12 06/23/2016   CO2 26 06/23/2016   TSH 1.08 06/23/2016   INR 0.89 06/08/2016   HGBA1C 6.0 06/23/2016    Dg Chest 2 View  Result Date: 07/07/2016 CLINICAL DATA:  Post right video assisted thoracotomy 11/17/ 17 EXAM: CHEST  2 VIEW COMPARISON:  06/23/2016 FINDINGS: Cardiomediastinal silhouette is stable. Stable right perihilar atelectasis or scarring. No infiltrate or pulmonary edema. Mild degenerative changes thoracic spine. IMPRESSION: Stable right perihilar atelectasis or scarring. No superimposed infiltrate or pulmonary edema. Electronically Signed   By: Lahoma Crocker M.D.   On: 07/07/2016 09:55    Assessment & Plan:   Cariann was seen today for osteoarthritis.  Diagnoses and all orders for this visit:  Mixed obsessional thoughts and acts- she is not willing to see a psychiatrist or a therapist, I think  she would benefit from starting Luvox -     fluvoxaMINE (LUVOX) 25 MG tablet; Take 1 tablet (25 mg total) by mouth at bedtime.  DDD (degenerative disc disease), cervical -     Discontinue: HYDROcodone-acetaminophen (NORCO) 10-325 MG tablet; Take 1 tablet by mouth every 6 (six) hours as needed (for pain). -     Discontinue: HYDROcodone-acetaminophen (NORCO) 10-325 MG tablet; Take 1 tablet by mouth every 6 (six) hours as needed (for pain). -     HYDROcodone-acetaminophen (NORCO) 10-325 MG tablet; Take 1 tablet by mouth every 6 (six) hours as needed (for pain).  Primary osteoarthritis involving multiple joints- I have decreased her Norco dose from 4 times a day to 3 times a day to try to avoid complications simultaneous benzo therapy and marijuana abuse -     Discontinue:  HYDROcodone-acetaminophen (NORCO) 10-325 MG tablet; Take 1 tablet by mouth every 6 (six) hours as needed (for pain). -     Discontinue: HYDROcodone-acetaminophen (NORCO) 10-325 MG tablet; Take 1 tablet by mouth every 6 (six) hours as needed (for pain). -     HYDROcodone-acetaminophen (NORCO) 10-325 MG tablet; Take 1 tablet by mouth every 6 (six) hours as needed (for pain).  Primary osteoarthritis of right knee- I've asked her to see orthopedics to see if there is a surgical intervention that will help with her knee pain. -     Ambulatory referral to Orthopedic Surgery   I have discontinued Ms. Collard's promethazine-codeine. I am also having her start on fluvoxaMINE. Additionally, I am having her maintain her esomeprazole, Fluticasone-Salmeterol, albuterol, ALPRAZolam, aspirin EC, ibuprofen, hydrocortisone, albuterol, and HYDROcodone-acetaminophen.  Meds ordered this encounter  Medications  . fluvoxaMINE (LUVOX) 25 MG tablet    Sig: Take 1 tablet (25 mg total) by mouth at bedtime.    Dispense:  90 tablet    Refill:  1  . DISCONTD: HYDROcodone-acetaminophen (NORCO) 10-325 MG tablet    Sig: Take 1 tablet by mouth every 6 (six) hours as needed (for pain).    Dispense:  90 tablet    Refill:  0  . DISCONTD: HYDROcodone-acetaminophen (NORCO) 10-325 MG tablet    Sig: Take 1 tablet by mouth every 6 (six) hours as needed (for pain).    Dispense:  90 tablet    Refill:  0  . HYDROcodone-acetaminophen (NORCO) 10-325 MG tablet    Sig: Take 1 tablet by mouth every 6 (six) hours as needed (for pain).    Dispense:  90 tablet    Refill:  0     Follow-up: Return in about 3 months (around 11/14/2016).  Scarlette Calico, MD

## 2016-08-16 NOTE — Patient Instructions (Signed)
Obsessive-Compulsive Disorder Obsessive-compulsive disorder (OCD) is a brain-based anxiety disorder. People with OCD have obsessions, compulsions, or both. Obsessions are unwanted and distressing thoughts, ideas, or urges that keep entering your mind and result in anxiety. You may find yourself trying to ignore them. You may try to stop or undo them with a compulsion. Compulsions are repetitive physical or mental acts that you feel you have to do. They may reduce or prevent any emotional distress, but in most instances, they are ineffective. Compulsions can be very time-consuming, often taking more than one hour each day. They can interfere with personal relationships and normal activities at home, school, or work. OCD can begin in childhood, but it usually starts in young adulthood and continues throughout life. Many people with OCD also have depression or another mental health disorder. What are the causes? The cause of this condition is not known. What increases the risk? This condition is more like to develop in:  People who have experienced trauma.  People who have a family history of OCD.  Women during and after pregnancy.  People who have infections and post-infectious autoimmune syndrome.  People who have other mental health conditions.  People who abuse substances. What are the signs or symptoms? Symptoms of OCD include compulsions and obsessions. People with obsessions usually have a fear that something terrible will happen or that they will do something terrible. Examples of common obsessions include:  Fear of contamination with germs, waste, or poisonous substances.  Fear of making the wrong decision.  Violent or sexual thoughts or urges towards others.  Need for symmetry or exactness. Examples of common compulsions include:  Excessive handwashing or bathing due to fear of contamination.  Checking things over and over to make sure you finished a task, such as making sure  you locked a door or unplugged a toaster.  Repeating an act or phrase over and over, sometimes a specific number of times, until it feels right.  Arranging and rearranging objects to keep them in a certain order.  Having a very hard time making a decision and sticking to it. How is this diagnosed? OCD is diagnosed through an assessment by your health care provider. Your health care provider will ask questions about any obsessions or compulsions you have and how they affect your life. Your health care provider may also ask about your medical history, prescription medicines, and drug use. Certain medical conditions and substances can cause symptoms that are similar to OCD. Your health care provider may also refer you to a mental health specialist. How is this treated? Treatment may include:  Cognitive therapy. This is a form of talk therapy. The goal is to identify and change the irrational thoughts associated with obsessions.  Behavioral therapy. A type of behavioral therapy called exposure and response prevention is often used. In this therapy, you will be exposed to the distressing situation that triggers your compulsion and be prevented from responding to it. With repetition of this process over time, you will no longer feel the distress or need to perform the compulsion.  Self-soothing. Meditation, deep breathing, or yoga can help you manage the physiological symptoms of anxiety and can help with how you think.  Medicine. Certain types of antidepressant medicine may help reduce or control OCD symptoms. Medicine is most effective when used with cognitive or behavioral therapy. Treatment usually involves a combination of therapy and medicines. For severe OCD that does not respond to talk therapy and medicine, brain surgery or electrical stimulation of  specific areas of the brain (deep brain stimulation) may be considered. Follow these instructions at home:  Take over-the-counter and  prescription medicines only as told by your health care provider. Do not start taking any new medicines with approval from your health care provider.  Consider joining a support group for people with OCD.  Keep all follow-up visits as told by your health care provider. This is important. Contact a health care provider if:  You are not able to take your medicines as prescribed.  Your symptoms get worse. Get help right away if:  You have suicidal thoughts or thoughts about hurting yourself or others. If you ever feel like you may hurt yourself or others, or have thoughts about taking your own life, get help right away. You can go to your nearest emergency department or call:  Your local emergency services (911 in the [place]  A suicide crisis helpline, such as the [facility] at 985-720-0054. This is open 24-hours a day. Summary  Obsessive-compulsive disorder (OCD) is a brain-based anxiety disorder. People with OCD have obsessions, compulsions, or both.  OCD can interfere with personal relationships and normal activities at home, school, or work.  Treatment usually involves a combination of therapy and medicines. This information is not intended to replace advice given to you by your health care provider. Make sure you discuss any questions you have with your health care provider. Document Released: 07/05/2001 Document Revised: 04/25/2016 Document Reviewed: 04/25/2016 Elsevier Interactive Patient Education  2017 [employer].

## 2016-08-21 ENCOUNTER — Encounter: Payer: Self-pay | Admitting: Internal Medicine

## 2016-09-09 DIAGNOSIS — J449 Chronic obstructive pulmonary disease, unspecified: Secondary | ICD-10-CM | POA: Diagnosis not present

## 2016-09-13 ENCOUNTER — Ambulatory Visit (INDEPENDENT_AMBULATORY_CARE_PROVIDER_SITE_OTHER): Payer: Self-pay | Admitting: Orthopaedic Surgery

## 2016-09-19 ENCOUNTER — Ambulatory Visit: Payer: Medicare Other | Admitting: Internal Medicine

## 2016-09-27 ENCOUNTER — Ambulatory Visit (INDEPENDENT_AMBULATORY_CARE_PROVIDER_SITE_OTHER): Payer: Self-pay | Admitting: Orthopaedic Surgery

## 2016-10-01 ENCOUNTER — Other Ambulatory Visit: Payer: Self-pay | Admitting: Internal Medicine

## 2016-10-03 ENCOUNTER — Other Ambulatory Visit: Payer: Self-pay | Admitting: Cardiothoracic Surgery

## 2016-10-03 ENCOUNTER — Ambulatory Visit (INDEPENDENT_AMBULATORY_CARE_PROVIDER_SITE_OTHER): Payer: Self-pay | Admitting: Orthopaedic Surgery

## 2016-10-03 DIAGNOSIS — D4989 Neoplasm of unspecified behavior of other specified sites: Secondary | ICD-10-CM

## 2016-10-03 DIAGNOSIS — D150 Benign neoplasm of thymus: Principal | ICD-10-CM

## 2016-10-04 DIAGNOSIS — J449 Chronic obstructive pulmonary disease, unspecified: Secondary | ICD-10-CM | POA: Diagnosis not present

## 2016-10-06 ENCOUNTER — Ambulatory Visit (INDEPENDENT_AMBULATORY_CARE_PROVIDER_SITE_OTHER): Payer: Medicare Other | Admitting: Cardiothoracic Surgery

## 2016-10-06 ENCOUNTER — Encounter: Payer: Self-pay | Admitting: Cardiothoracic Surgery

## 2016-10-06 ENCOUNTER — Ambulatory Visit
Admission: RE | Admit: 2016-10-06 | Discharge: 2016-10-06 | Disposition: A | Discharge status: Home / Self Care | Payer: Medicare Other | Source: Ambulatory Visit | Attending: Cardiothoracic Surgery | Admitting: Cardiothoracic Surgery

## 2016-10-06 VITALS — BP 109/75 | HR 74 | Resp 16 | Ht 66.0 in | Wt 196.0 lb

## 2016-10-06 DIAGNOSIS — J9859 Other diseases of mediastinum, not elsewhere classified: Secondary | ICD-10-CM | POA: Diagnosis not present

## 2016-10-06 DIAGNOSIS — Z09 Encounter for follow-up examination after completed treatment for conditions other than malignant neoplasm: Secondary | ICD-10-CM

## 2016-10-06 DIAGNOSIS — J9811 Atelectasis: Secondary | ICD-10-CM | POA: Diagnosis not present

## 2016-10-06 DIAGNOSIS — D150 Benign neoplasm of thymus: Secondary | ICD-10-CM

## 2016-10-06 DIAGNOSIS — D4989 Neoplasm of unspecified behavior of other specified sites: Secondary | ICD-10-CM

## 2016-10-06 NOTE — Progress Notes (Signed)
[address]       [place] [address]             [phone]      Felicia [place] Medical Record #[id number] Date of Birth: [birth year]  Referring: Deneise Lever, MD Primary Care: Scarlette Calico, MD  Chief Complaint:   POST OP FOLLOW UP 06/10/2016 OPERATIVE REPORT PREOPERATIVE DIAGNOSIS:  Anterior mediastinal mass. 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 PROCEDURE PERFORMED:  Bronchoscopy, right video-assisted thoracoscopy, resection of anterior mediastinal tumor SURGEON:  Lanelle Bal, MD. 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 - SEE COMMENT. Microscopic Comment Dr. Vicente Males has reviewed the case and concurs with this interpretation. (JBK:gt, 06/13/16) [name] MD  History of Present Illness:     Patient returns to the office after recent right video-assisted thoracoscopy and resection of anterior mediastinal tumor which on final path turned out to be a thymoma, at time of resection there was no indication that this was a malignant process. Patient is back almost completely to usual activities.    Past Medical History:  Diagnosis Date  . Arthritis    neck  . Asthma   . Chronic back pain   . Chronic bronchitis ([place])   . Chronic neck pain   . COPD (chronic obstructive pulmonary disease) ([facility])    Advair and Albuterol as needed as well as neb  . GERD (gastroesophageal reflux disease)    takes Nexium daily  . History of blood transfusion    in the 70's. No abnormal reaction noted  . History of colon polyps    benign  . Hyperlipidemia    not on any meds bc refused to pick up from pharmacy  . Joint pain   . Joint swelling   . Mediastinal tumor   . Nocturia   . OCD (obsessive compulsive disorder)   . Panic disorder    takes Xanax daily as needed  . Pneumonia    2016  . PONV (postoperative nausea and vomiting)   .  Seizures ([place])    in the 80's     History  Smoking Status  . Former Smoker  . Packs/day: 1.00  . Years: 20.00  . Types: Cigarettes  Smokeless Tobacco  . Never Used    Comment: quit smoking 70yrs ago    History  Alcohol Use No     Allergies  Allergen Reactions  . No Known Allergies     Current Outpatient Prescriptions  Medication Sig Dispense Refill  . albuterol (PROAIR HFA) 108 (90 Base) MCG/ACT inhaler Inhale 1-2 puffs into the lungs every 4 (four) hours as needed for wheezing or shortness of breath. 8.5 Inhaler 12  . albuterol (PROVENTIL) (2.5 MG/3ML) 0.083% nebulizer solution Take 3 mLs (2.5 mg total) by nebulization every 6 (six) hours. Dx COPD with bronchitis 120 mL 5  . ALPRAZolam (XANAX) 1 MG tablet TAKE 1 TABLET BY MOUTH EVERY 6 HOURS AS NEEDED 120 tablet 5  . aspirin EC 81 MG tablet Take 81 mg by mouth daily.    Marland Kitchen esomeprazole (NEXIUM) 40 MG capsule TAKE 1 CAPSULE (40 MG TOTAL) BY MOUTH DAILY. 90 capsule 1  . Fluticasone-Salmeterol (ADVAIR DISKUS) 500-50 MCG/DOSE AEPB INHALE 1 PUFF BY MOUTH INTO THE LUNGS EVERY 12 HOURS (RINSE MOUTH AFTERWARDS) (Patient taking differently: INHALE 1 PUFF BY MOUTH INTO THE LUNGS EVERY 12 HOURS (RINSE MOUTH AFTERWARDS) AS NEEDED FOR WHEEZING  OR SHORTNESS OF BREATH) 1 each 12  . HYDROcodone-acetaminophen (NORCO) 10-325 MG tablet Take 1 tablet by mouth every 6 (six) hours as needed (for pain). 90 tablet 0  . hydrocortisone 2.5 % cream Apply 1 application topically daily as needed (itching).     Marland Kitchen ibuprofen (ADVIL,MOTRIN) 800 MG tablet Take 800 mg by mouth 3 (three) times daily as needed for moderate pain.      No current facility-administered medications for this visit.        Physical Exam: BP 109/75 (BP Location: Right Arm, Patient Position: Sitting, Cuff Size: Large)   Pulse 74   Resp 16   Ht 5\' 6"  (1.676 m)   Wt 196 lb (88.9 kg)   SpO2 99% Comment: ON RA  BMI 31.64 kg/m   General appearance: alert and  cooperative Neurologic: intact Heart: regular rate and rhythm, S1, S2 normal, no murmur, click, rub or gallop Lungs: clear to auscultation bilaterally Abdomen: soft, non-tender; bowel sounds normal; no masses,  no organomegaly Extremities: extremities normal, atraumatic, no cyanosis or edema and Homans sign is negative, Patient has no edema in the right lower extremity, but has noticed has some slight calf tenderness Wound: Patient's anteriormost chest tube site has completely closed, there is no evidence of port site infections at this time.  Diagnostic Studies & Laboratory data:     Recent Radiology Findings:   Dg Chest 2 View  Result Date: 10/06/2016 CLINICAL DATA:  History of thoracic surgery for thymoma on June 10, 2016. The patient reports chest tenderness. History of COPD. EXAM: CHEST  2 VIEW COMPARISON:  PA and lateral chest x-ray of July 07, 2016 FINDINGS: The lungs are well-expanded. There is no focal infiltrate. Thickening in the region of the minor fissure has resolved. There is subtle linear density in the lingula laterally. The heart and pulmonary vascularity are normal. The mediastinum is normal in width. The retrosternal soft tissues are normal. There is calcification in the wall of the aortic arch. The bony thorax exhibits no acute abnormality. IMPRESSION: Minimal subsegmental atelectasis in the lingula. Interval clearing of thickening of the minor fissure. No acute pneumonia. No pulmonary or mediastinal mass is observed. Thoracic aortic atherosclerosis. Electronically Signed   By: Felicia  Odom M.D.   On: 10/06/2016 12:29  I have independently reviewed the above radiology studies  and reviewed the findings with the patient.  ------------------------------------------------------------------- Summary:  - No evidence of deep vein thrombosis involving the visualized   veins of the lower extremities. - No evidence of Baker&'s cyst on the right or left.  Other specific  details can be found in the table(s) above. Prepared and Electronically Authenticated by  Adele Barthel, MD 2017-11-30T19:32:24    Recent Lab Findings: Lab Results  Component Value Date   WBC 7.2 06/12/2016   HGB 12.1 06/12/2016   HCT 38.4 06/12/2016   PLT 216 06/12/2016   GLUCOSE 129 (H) 06/23/2016   CHOL 182 06/23/2016   TRIG 144.0 06/23/2016   HDL 35.20 (L) 06/23/2016   LDLDIRECT 142.0 01/21/2015   LDLCALC 118 (H) 06/23/2016   ALT 16 06/12/2016   AST 20 06/12/2016   NA 141 06/23/2016   K 3.9 06/23/2016   CL 106 06/23/2016   CREATININE 0.86 06/23/2016   BUN 12 06/23/2016   CO2 26 06/23/2016   TSH 1.08 06/23/2016   INR 0.89 06/08/2016   HGBA1C 6.0 06/23/2016      Assessment / Plan:    Patient stable after recent anterior mediastinal tumor  resection via right VATS- final path thymoma- Presented at the multidisciplinary thoracic oncology conference and the consensus was that no further therapy other than observation was indicated. Plan to see back in 8 months with a follow-up chest  CT   Grace Isaac MD      [address] ,[place] [address] Office [phone]   Beeper [phone]  10/06/2016 1:58 PM

## 2016-10-10 ENCOUNTER — Ambulatory Visit: Payer: Medicare Other | Admitting: Internal Medicine

## 2016-10-12 ENCOUNTER — Ambulatory Visit: Payer: Medicare Other | Admitting: Internal Medicine

## 2016-10-26 DIAGNOSIS — J449 Chronic obstructive pulmonary disease, unspecified: Secondary | ICD-10-CM | POA: Diagnosis not present

## 2016-11-02 ENCOUNTER — Ambulatory Visit (INDEPENDENT_AMBULATORY_CARE_PROVIDER_SITE_OTHER): Payer: Medicare Other

## 2016-11-02 ENCOUNTER — Ambulatory Visit (INDEPENDENT_AMBULATORY_CARE_PROVIDER_SITE_OTHER): Payer: Medicare Other | Admitting: Orthopaedic Surgery

## 2016-11-02 DIAGNOSIS — G8929 Other chronic pain: Secondary | ICD-10-CM

## 2016-11-02 DIAGNOSIS — M25562 Pain in left knee: Secondary | ICD-10-CM

## 2016-11-02 DIAGNOSIS — M25561 Pain in right knee: Secondary | ICD-10-CM

## 2016-11-02 NOTE — Progress Notes (Signed)
Office Visit Note   Patient: Felicia Odom           Date of Birth: 10/17/1955           MRN: [id number] Visit Date: 11/02/2016              Requested by: [name], MD [address] [place], [place] [address] PCP: Scarlette Calico, MD   Assessment & Plan: Visit Diagnoses:  1. Chronic pain of both knees     Plan: I gave her reassurance that she does not need the replacements at this point. Her arthritis is only mild. She'll benefit from over-the-counter supplements such as glucosamine orTumeric. We can always provide steroid injections really hyaluronic acid. She does report gaining significant amount of weight since her chest surgery and that losing weight would certainly help with her knee pain. Also showed her quad strength and exercises are demonstrated in his back to me. If he gets the point where she is tried injections Let us know.   Follow-Up Instructions: Return if symptoms worsen or fail to improve.   Orders:  Orders Placed This Encounter  Procedures  . XR Knee 1-2 Views Left  . XR Knee 1-2 Views Right   No orders of the defined types were placed in this encounter.     Procedures: No procedures performed   Clinical Data: No additional findings.   Subjective: No chief complaint on file. The patient comes in with chief complaint of bilateral knee pain is been hurting for several years now. She works as a Marine scientist for many years she does get swelling at times. Her pain is mainly at night. She denies any injuries to her knees. She does report multiple family members having knee replacement surgery and that arthritis does run in her family. Her pain is more of an irritating pain and she points the calf muscles more source for pain in the knee joint some cells.  HPI  Review of Systems Denies any headache, shortness of breath, fever, chills, nausea, vomiting. She does report chest pain from surgery on her chest 5 months ago.  Objective: Vital  Signs: There were no vitals taken for this visit.  Physical Exam She is alert and oriented 3 in no acute distress Ortho Exam Examination of both her knee shows no effusion. She says just slight global tenderness but no redness and full range of motion with some slight patellofemoral crepitation but no malalignment issues. Her ligamentous exam of both knees is normal. Specialty Comments:  No specialty comments available.  Imaging: Xr Knee 1-2 Views Left  Result Date: 11/02/2016 AP and lateral left knee show only mild arthritic changes with no effusion in no acute fractures or malalignment issues    PMFS History: Patient Active Problem List   Diagnosis Date Noted  . Primary osteoarthritis of right knee 08/16/2016  . Moderate tetrahydrocannabinol (THC) dependence ([place]) 07/26/2016  . Essential hypertension 09/25/2015  . DDD (degenerative disc disease), cervical 06/15/2015  . Seborrheic dermatitis of scalp 02/11/2015  . Primary osteoarthritis involving multiple joints 01/21/2015  . Hyperglycemia 01/21/2015  . History of colonic polyps 01/21/2015  . Hyperlipidemia with target LDL less than 100 01/21/2015  . GERD (gastroesophageal reflux disease) 05/08/2013  . Atherosclerosis of aorta ([place]) 05/08/2013  . Preventative health care 12/17/2011  . ALLERGIC RHINITIS 10/01/2010  . PANIC DISORDER 06/08/2007  . Obsessive-compulsive disorder 06/08/2007   Past Medical History:  Diagnosis Date  . Arthritis    neck  .  Asthma   . Chronic back pain   . Chronic bronchitis ([place])   . Chronic neck pain   . COPD (chronic obstructive pulmonary disease) ([facility])    Advair and Albuterol as needed as well as neb  . GERD (gastroesophageal reflux disease)    takes Nexium daily  . History of blood transfusion    in the 70's. No abnormal reaction noted  . History of colon polyps    benign  . Hyperlipidemia    not on any meds bc refused to pick up from pharmacy  . Joint pain   . Joint swelling   .  Mediastinal tumor   . Nocturia   . OCD (obsessive compulsive disorder)   . Panic disorder    takes Xanax daily as needed  . Pneumonia    2016  . PONV (postoperative nausea and vomiting)   . Seizures ([place])    in the 80's    Family History  Problem Relation Age of Onset  . Heart attack Father   . Diabetes Mother   . Clotting disorder Mother     PE  . Breast cancer Sister   . Other Sister     colon mass-benign  . Esophageal cancer Neg Hx   . Colon cancer Neg Hx   . Rectal cancer Neg Hx   . Stomach cancer Neg Hx     Past Surgical History:  Procedure Laterality Date  . COLONOSCOPY    . ECTOPIC PREGNANCY SURGERY     x 2   . ESOPHAGOGASTRODUODENOSCOPY    . LAPAROSCOPIC LYSIS INTESTINAL ADHESIONS     x 2  . RESECTION OF MEDIASTINAL MASS N/A 06/10/2016   Procedure: RESECTION OF MEDIASTINAL MASS;  Surgeon: Grace Isaac, MD;  Location: [facility];  Service: Thoracic;  Laterality: N/A;  . VIDEO ASSISTED THORACOSCOPY Right 06/10/2016   Procedure: VIDEO ASSISTED THORACOSCOPY WITH PLACEMENT OF ON Q TUNNELER;  Surgeon: Grace Isaac, MD;  Location: [facility];  Service: Thoracic;  Laterality: Right;  Marland Kitchen VIDEO BRONCHOSCOPY N/A 06/10/2016   Procedure: VIDEO BRONCHOSCOPY;  Surgeon: Grace Isaac, MD;  Location: [facility] OR;  Service: Thoracic;  Laterality: N/A;   Social History   Occupational History  . 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 Smoking status: Former Smoker    Packs/day: 1.00    Years: 20.00    Types: Cigarettes  . Smokeless tobacco: Never Used     Comment: quit smoking 52yrs ago  . Alcohol use No  . Drug use: No  . Sexual activity: Not on file

## 2016-11-08 DIAGNOSIS — L218 Other seborrheic dermatitis: Secondary | ICD-10-CM | POA: Diagnosis not present

## 2016-11-08 DIAGNOSIS — L309 Dermatitis, unspecified: Secondary | ICD-10-CM | POA: Diagnosis not present

## 2016-11-09 DIAGNOSIS — Z01419 Encounter for gynecological examination (general) (routine) without abnormal findings: Secondary | ICD-10-CM | POA: Diagnosis not present

## 2016-11-11 ENCOUNTER — Other Ambulatory Visit: Payer: Self-pay | Admitting: Obstetrics & Gynecology

## 2016-11-11 DIAGNOSIS — E2839 Other primary ovarian failure: Secondary | ICD-10-CM

## 2016-11-14 ENCOUNTER — Encounter: Payer: Self-pay | Admitting: Internal Medicine

## 2016-11-14 ENCOUNTER — Ambulatory Visit (INDEPENDENT_AMBULATORY_CARE_PROVIDER_SITE_OTHER): Payer: Medicare Other | Admitting: Internal Medicine

## 2016-11-14 DIAGNOSIS — F410 Panic disorder [episodic paroxysmal anxiety] without agoraphobia: Secondary | ICD-10-CM

## 2016-11-14 DIAGNOSIS — R0789 Other chest pain: Secondary | ICD-10-CM

## 2016-11-14 MED ORDER — ALPRAZOLAM 1 MG PO TABS: 1.0000 mg | ORAL_TABLET | Freq: Four times a day (QID) | ORAL | 5 refills | Status: DC | PRN

## 2016-11-14 NOTE — Assessment & Plan Note (Signed)
I agreed to refill Xanax after appropriate discussion, since we have filled out in the past. Education done. They do monitor this.

## 2016-11-14 NOTE — Assessment & Plan Note (Signed)
She complains of right parasternal pain, somewhat pleuritic and tender. Suspect this may be post thoracotomy pain after VATS surgery with impact on intracostal nerves I suggested she might want to ask for pain clinic referral if this continues to bother her. She has seen her PCP.

## 2016-11-14 NOTE — Patient Instructions (Signed)
Xanax refilled  Suggest you ask Dr Ronnald Ramp for referral to a pain clinic if you need help with pain meds  Call for refills of breathing meds when needed

## 2016-11-14 NOTE — Progress Notes (Signed)
Patient ID: Felicia Odom, female    DOB: 1956/07/08, 61 y.o.   MRN: [id number]  HPI F former smoker followed for COPD, Allergic rhinitis, complicated by thymoma/ resected, anxiety Office spirometry- 01/29/15 Normal spirometry. FVC 2.74/81%, FEV1 2.11/79%, FEV1/FVC 0.77, FEF 25-75 percent 1.79 Office Spirometry 10/21/2015-slight restriction of exhaled volume, mild obstruction. FVC 2.69/78%, FEV1 2.04/76%, FEV1/FVC 0.76, FEF 25-75 percent 1.68/65% PFT 04/27/2016-minimal obstructive airways disease, minimal diffusion defect, insignificant response to bronchodilator. FVC 2.79/78%, FEV1 2.11/76%, ratio 0.76, FEF 25-75 percent 1.81/72%, TLC 92%, DLCO 76%. .-------------------------------------------------------------------------------------------------------  05/05/2016-61 year old female former smoker followed for asthma/bronchitis, allergic rhinitis, complicated by thymoma/ TSGY, OCD/panic, anxiety, GERD FOLLOWS FOR: Pt states she needs peace of mind from CY about clearance for removal of mass from pulmonary stand point. Also, patient would like to have all refills.  Not smoking. Little cough or wheeze she has some anxiety about proposed thymectomy but is prepared to go ahead. No recent acute illness. CT chest 04/21/2016 IMPRESSION: 1. Interval growth of the previously noted well-circumscribed soft tissue attenuation lesion in the anterior mediastinum. This lesion is well separated from the thyroid gland, and demonstrates no internal cystic change and no calcification. There is no associated pleural nodularity or pleural effusion. Overall, the imaging features favor a benign lesion such as a thymoma, however, the continued interval growth is concerning. Biopsy or resection of the lesion is recommended if clinically appropriate. 2. There are calcifications of the aortic valve. Echocardiographic correlation for evaluation of potential valvular dysfunction may be warranted if clinically  indicated. 3. 2.1 x 1.8 cm nodule in the right lobe of the thyroid gland appears similar to the prior examination, per report, previously biopsied and benign. 4. Aortic atherosclerosis. Electronically Signed   By: [name] M.D.   On: 04/21/2016 11:44 PFT 04/27/2016-minimal obstructive airways disease, minimal diffusion defect, insignificant response to bronchodilator. FVC 2.79/78%, FEV1 2.11/76%, ratio 0.76, FEF 25-75 percent 1.81/72%, TLC 92%, DLCO 76%.  11/14/16- 61 year old female former smoker followed for asthma/bronchitis, allergic rhinitis, complicated by thymoma/ TSGY, OCD/panic, anxiety, GERD 4 month f/u. Pt. states her breathing has been fine. Residual pleuritic right parasternal pain since R chest surgery for thymoma. We discussed pain medicine and chronic anxiety. She has we refill Xanax which we have given her in the past. Discussed having PCP refer her to pain clinic. Occasional minor cough with no acute respiratory problems this spring, little wheeze. Has her inhalers but not needing rescue inhaler every day. CXR 3:15/18 IMPRESSION: Minimal subsegmental atelectasis in the lingula. Interval clearing of thickening of the minor fissure. No acute pneumonia. No pulmonary or mediastinal mass is observed. Thoracic aortic atherosclerosis.  Review of Systems- see HPI Constitutional:   No-   weight loss, night sweats, fevers, chills, fatigue, lassitude. HEENT:   No-  headaches, difficulty swallowing, tooth/dental problems, sore throat,       No-  sneezing, itching, ear ache, nasal congestion, post nasal drip,  CV:  No- chest pain, No-orthopnea, PND, swelling in lower extremities, anasarca, dizziness, palpitations Resp: + shortness of breath with exertion or at rest.              productive cough,   non-productive cough,  No- coughing up of blood.            change in color of mucus.  No- wheezing.   Skin: No-   rash or lesions. GI:    heartburn, indigestion, No-abdominal  pain, nausea, vomiting,  GU:  MS:  + joint pain or  swelling.  . Neuro-     nothing unusual Psych: +change in mood or affect. +Chronic depression , + anxiety.  No memory loss.   Objective:   Physical Exam General- Alert, Oriented , Distress- none acute,  Overweight,  Skin- rash-none, lesions- none, excoriation- none Lymphadenopathy- none Head- atraumatic            Eyes- Gross vision intact, PERRLA, conjunctivae clear secretions            Ears- Hearing, canals-normal            Nose- No- rhinorrhea, no-Septal dev,, polyps, erosion, perforation             Throat- Mallampati II , mucosa clear , drainage- none, tonsils- atrophic. + dentures Neck- flexible , trachea midline, no stridor , thyroid nl, carotid no bruit Chest - symmetrical excursion , unlabored           Heart/CV- RRR , no murmur , no gallop, no rub, nl s1 s2                           - JVD- none , edema- none, stasis changes- none, varices- none           Lung- wheeze-none, cough-none , dullness-none, rub- none,            Chest wall-  R VATS scars, some tenderness to pressure. No rub. Abd-  Br/ Gen/ Rectal- Not done, not indicated Extrem-  Neuro- grossly intact to observation

## 2016-11-16 ENCOUNTER — Encounter: Payer: Self-pay | Admitting: Internal Medicine

## 2016-11-16 ENCOUNTER — Ambulatory Visit (INDEPENDENT_AMBULATORY_CARE_PROVIDER_SITE_OTHER): Payer: Medicare Other | Admitting: Internal Medicine

## 2016-11-16 VITALS — BP 120/80 | HR 71 | Temp 97.9°F | Resp 16 | Ht 66.0 in | Wt 207.0 lb

## 2016-11-16 DIAGNOSIS — M159 Polyosteoarthritis, unspecified: Secondary | ICD-10-CM

## 2016-11-16 DIAGNOSIS — M1711 Unilateral primary osteoarthritis, right knee: Secondary | ICD-10-CM | POA: Diagnosis not present

## 2016-11-16 DIAGNOSIS — I10 Essential (primary) hypertension: Secondary | ICD-10-CM

## 2016-11-16 DIAGNOSIS — M150 Primary generalized (osteo)arthritis: Secondary | ICD-10-CM

## 2016-11-16 DIAGNOSIS — M5030 Other cervical disc degeneration, unspecified cervical region: Secondary | ICD-10-CM | POA: Diagnosis not present

## 2016-11-16 MED ORDER — HYDROCODONE-ACETAMINOPHEN 10-325 MG PO TABS: 1.0000 | ORAL_TABLET | Freq: Four times a day (QID) | ORAL | 0 refills | Status: DC | PRN

## 2016-11-16 NOTE — Patient Instructions (Signed)

## 2016-11-16 NOTE — Progress Notes (Signed)
Pre visit review using our clinic review tool, if applicable. No additional management support is needed unless otherwise documented below in the visit note. 

## 2016-11-16 NOTE — Progress Notes (Signed)
Subjective:  Patient ID: Felicia Odom, female    DOB: 06-27-1956  Age: 61 y.o. MRN: [id number]  CC: Hypertension and Osteoarthritis   HPI Felicia Odom presents for a BP check and refill on medication for chronic pain. She complains of chronic, worsening neck pain and wants to be referred her neurosurgeon to see if surgery will help. She also complains of chronic arthritis pain in both knees and tells me that Norco is controlling her pain. She otherwise feels well and offers no other complaints today.  Outpatient Medications Prior to Visit  Medication Sig Dispense Refill  . albuterol (PROAIR HFA) 108 (90 Base) MCG/ACT inhaler Inhale 1-2 puffs into the lungs every 4 (four) hours as needed for wheezing or shortness of breath. 8.5 Inhaler 12  . albuterol (PROVENTIL) (2.5 MG/3ML) 0.083% nebulizer solution Take 3 mLs (2.5 mg total) by nebulization every 6 (six) hours. Dx COPD with bronchitis 120 mL 5  . ALPRAZolam (XANAX) 1 MG tablet Take 1 tablet (1 mg total) by mouth every 6 (six) hours as needed. 120 tablet 5  . aspirin EC 81 MG tablet Take 81 mg by mouth daily.    Marland Kitchen esomeprazole (NEXIUM) 40 MG capsule TAKE 1 CAPSULE (40 MG TOTAL) BY MOUTH DAILY. 90 capsule 1  . Fluticasone-Salmeterol (ADVAIR DISKUS) 500-50 MCG/DOSE AEPB INHALE 1 PUFF BY MOUTH INTO THE LUNGS EVERY 12 HOURS (RINSE MOUTH AFTERWARDS) (Patient taking differently: INHALE 1 PUFF BY MOUTH INTO THE LUNGS EVERY 12 HOURS (RINSE MOUTH AFTERWARDS) AS NEEDED FOR WHEEZING OR SHORTNESS OF BREATH) 1 each 12  . hydrocortisone 2.5 % cream Apply 1 application topically daily as needed (itching).     Marland Kitchen ibuprofen (ADVIL,MOTRIN) 800 MG tablet Take 800 mg by mouth 3 (three) times daily as needed for moderate pain.     Marland Kitchen HYDROcodone-acetaminophen (NORCO) 10-325 MG tablet Take 1 tablet by mouth every 6 (six) hours as needed (for pain). 90 tablet 0   No facility-administered medications prior to visit.     ROS Review of Systems    Constitutional: Negative.  Negative for appetite change, diaphoresis, fatigue and unexpected weight change.  HENT: Negative.  Negative for trouble swallowing.   Eyes: Negative for visual disturbance.  Respiratory: Negative for cough, chest tightness, shortness of breath and wheezing.   Cardiovascular: Negative for chest pain, palpitations and leg swelling.  Gastrointestinal: Negative for abdominal pain, constipation, diarrhea, nausea and vomiting.  Endocrine: Negative.   Genitourinary: Negative.  Negative for difficulty urinating, enuresis and frequency.  Musculoskeletal: Positive for arthralgias and neck pain. Negative for gait problem and joint swelling.  Skin: Negative.   Allergic/Immunologic: Negative.   Neurological: Negative.  Negative for dizziness and weakness.  Hematological: Negative for adenopathy. Does not bruise/bleed easily.  Psychiatric/Behavioral: Negative.  Negative for confusion, decreased concentration and sleep disturbance. The patient is not nervous/anxious.     Objective:  BP 120/80 (BP Location: Left Arm, Patient Position: Sitting, Cuff Size: Large)   Pulse 71   Temp 97.9 F (36.6 C) (Oral)   Resp 16   Ht 5\' 6"  (1.676 m)   Wt 207 lb (93.9 kg)   SpO2 99%   BMI 33.41 kg/m   BP Readings from Last 3 Encounters:  11/16/16 120/80  11/14/16 124/72  10/06/16 109/75    Wt Readings from Last 3 Encounters:  11/16/16 207 lb (93.9 kg)  11/14/16 201 lb 9.6 oz (91.4 kg)  10/06/16 196 lb (88.9 kg)    Physical Exam  Constitutional: She is  oriented to person, place, and time. No distress.  HENT:  Mouth/Throat: Oropharynx is clear and moist. No oropharyngeal exudate.  Eyes: Conjunctivae are normal. Right eye exhibits no discharge. Left eye exhibits no discharge. No scleral icterus.  Neck: Normal range of motion. Neck supple. No JVD present. No tracheal deviation present. No thyromegaly present.  Cardiovascular: Normal rate, regular rhythm and intact distal pulses.   Exam reveals no gallop and no friction rub.   No murmur heard. Pulmonary/Chest: Effort normal and breath sounds normal. No respiratory distress. She has no wheezes. She has no rales. She exhibits no tenderness.  Abdominal: Soft. Bowel sounds are normal. She exhibits no distension and no mass. There is no tenderness. There is no rebound and no guarding.  Musculoskeletal: She exhibits no edema, tenderness or deformity.  Lymphadenopathy:    She has no cervical adenopathy.  Neurological: She is oriented to person, place, and time.  Skin: Skin is warm and dry. No rash noted. She is not diaphoretic. No erythema.  Psychiatric: She has a normal mood and affect. Her behavior is normal. Judgment and thought content normal.  Vitals reviewed.   Lab Results  Component Value Date   WBC 7.2 06/12/2016   HGB 12.1 06/12/2016   HCT 38.4 06/12/2016   PLT 216 06/12/2016   GLUCOSE 129 (H) 06/23/2016   CHOL 182 06/23/2016   TRIG 144.0 06/23/2016   HDL 35.20 (L) 06/23/2016   LDLDIRECT 142.0 01/21/2015   LDLCALC 118 (H) 06/23/2016   ALT 16 06/12/2016   AST 20 06/12/2016   NA 141 06/23/2016   K 3.9 06/23/2016   CL 106 06/23/2016   CREATININE 0.86 06/23/2016   BUN 12 06/23/2016   CO2 26 06/23/2016   TSH 1.08 06/23/2016   INR 0.89 06/08/2016   HGBA1C 6.0 06/23/2016    Dg Chest 2 View  Result Date: 10/06/2016 CLINICAL DATA:  History of thoracic surgery for thymoma on June 10, 2016. The patient reports chest tenderness. History of COPD. EXAM: CHEST  2 VIEW COMPARISON:  PA and lateral chest x-ray of July 07, 2016 FINDINGS: The lungs are well-expanded. There is no focal infiltrate. Thickening in the region of the minor fissure has resolved. There is subtle linear density in the lingula laterally. The heart and pulmonary vascularity are normal. The mediastinum is normal in width. The retrosternal soft tissues are normal. There is calcification in the wall of the aortic arch. The bony thorax exhibits  no acute abnormality. IMPRESSION: Minimal subsegmental atelectasis in the lingula. Interval clearing of thickening of the minor fissure. No acute pneumonia. No pulmonary or mediastinal mass is observed. Thoracic aortic atherosclerosis. Electronically Signed   By: David  Martinique M.D.   On: 10/06/2016 12:29    Assessment & Plan:   Alfhild was seen today for hypertension and osteoarthritis.  Diagnoses and all orders for this visit:  Essential hypertension- Her BP is well controlled  DDD (degenerative disc disease), cervical -     Discontinue: HYDROcodone-acetaminophen (NORCO) 10-325 MG tablet; Take 1 tablet by mouth every 6 (six) hours as needed (for pain). -     Discontinue: HYDROcodone-acetaminophen (NORCO) 10-325 MG tablet; Take 1 tablet by mouth every 6 (six) hours as needed (for pain). -     Discontinue: HYDROcodone-acetaminophen (NORCO) 10-325 MG tablet; Take 1 tablet by mouth every 6 (six) hours as needed (for pain). -     Ambulatory referral to Neurosurgery -     HYDROcodone-acetaminophen (NORCO) 10-325 MG tablet; Take 1 tablet by mouth  every 6 (six) hours as needed (for pain).  Primary osteoarthritis involving multiple joints -     Discontinue: HYDROcodone-acetaminophen (NORCO) 10-325 MG tablet; Take 1 tablet by mouth every 6 (six) hours as needed (for pain). -     Discontinue: HYDROcodone-acetaminophen (NORCO) 10-325 MG tablet; Take 1 tablet by mouth every 6 (six) hours as needed (for pain). -     Discontinue: HYDROcodone-acetaminophen (NORCO) 10-325 MG tablet; Take 1 tablet by mouth every 6 (six) hours as needed (for pain). -     HYDROcodone-acetaminophen (NORCO) 10-325 MG tablet; Take 1 tablet by mouth every 6 (six) hours as needed (for pain).  Primary osteoarthritis of right knee -     Discontinue: HYDROcodone-acetaminophen (NORCO) 10-325 MG tablet; Take 1 tablet by mouth every 6 (six) hours as needed (for pain). -     Discontinue: HYDROcodone-acetaminophen (NORCO) 10-325 MG  tablet; Take 1 tablet by mouth every 6 (six) hours as needed (for pain). -     Discontinue: HYDROcodone-acetaminophen (NORCO) 10-325 MG tablet; Take 1 tablet by mouth every 6 (six) hours as needed (for pain). -     HYDROcodone-acetaminophen (NORCO) 10-325 MG tablet; Take 1 tablet by mouth every 6 (six) hours as needed (for pain).   I am having Ms. Bentler maintain her Fluticasone-Salmeterol, albuterol, aspirin EC, ibuprofen, hydrocortisone, albuterol, esomeprazole, ALPRAZolam, and HYDROcodone-acetaminophen.  Meds ordered this encounter  Medications  . DISCONTD: HYDROcodone-acetaminophen (NORCO) 10-325 MG tablet    Sig: Take 1 tablet by mouth every 6 (six) hours as needed (for pain).    Dispense:  90 tablet    Refill:  0  . DISCONTD: HYDROcodone-acetaminophen (NORCO) 10-325 MG tablet    Sig: Take 1 tablet by mouth every 6 (six) hours as needed (for pain).    Dispense:  90 tablet    Refill:  0  . DISCONTD: HYDROcodone-acetaminophen (NORCO) 10-325 MG tablet    Sig: Take 1 tablet by mouth every 6 (six) hours as needed (for pain).    Dispense:  90 tablet    Refill:  0  . HYDROcodone-acetaminophen (NORCO) 10-325 MG tablet    Sig: Take 1 tablet by mouth every 6 (six) hours as needed (for pain).    Dispense:  90 tablet    Refill:  0     Follow-up: No Follow-up on file.  Scarlette Calico, MD

## 2016-11-17 ENCOUNTER — Ambulatory Visit
Admission: RE | Admit: 2016-11-17 | Discharge: 2016-11-17 | Disposition: A | Discharge status: Home / Self Care | Payer: Medicare Other | Source: Ambulatory Visit | Attending: Obstetrics & Gynecology | Admitting: Obstetrics & Gynecology

## 2016-11-17 DIAGNOSIS — E2839 Other primary ovarian failure: Secondary | ICD-10-CM

## 2016-11-17 DIAGNOSIS — Z13820 Encounter for screening for osteoporosis: Secondary | ICD-10-CM | POA: Diagnosis not present

## 2016-11-17 DIAGNOSIS — Z780 Asymptomatic menopausal state: Secondary | ICD-10-CM | POA: Diagnosis not present

## 2016-11-18 DIAGNOSIS — J449 Chronic obstructive pulmonary disease, unspecified: Secondary | ICD-10-CM | POA: Diagnosis not present

## 2016-12-01 ENCOUNTER — Other Ambulatory Visit: Payer: Self-pay | Admitting: Internal Medicine

## 2016-12-01 DIAGNOSIS — M159 Polyosteoarthritis, unspecified: Secondary | ICD-10-CM

## 2016-12-01 DIAGNOSIS — M150 Primary generalized (osteo)arthritis: Principal | ICD-10-CM

## 2016-12-01 DIAGNOSIS — M5030 Other cervical disc degeneration, unspecified cervical region: Secondary | ICD-10-CM

## 2016-12-26 ENCOUNTER — Telehealth: Payer: Self-pay | Admitting: Internal Medicine

## 2016-12-26 MED ORDER — PROMETHAZINE-CODEINE 6.25-10 MG/5ML PO SYRP: 5.0000 mL | ORAL_SOLUTION | Freq: Four times a day (QID) | ORAL | 0 refills | Status: DC | PRN

## 2016-12-26 MED ORDER — AZITHROMYCIN 250 MG PO TABS: 250.0000 mg | ORAL_TABLET | ORAL | 0 refills | Status: DC

## 2016-12-26 NOTE — Telephone Encounter (Signed)
Ok- order Z pak    250 mg, # 6, 2 today then one daily                   prometh codeine cough syrup   200 ml,        5 ml every 6 hours if needed for cough

## 2016-12-26 NOTE — Telephone Encounter (Signed)
Patient called checking on meds - pt can be reached at [phone] -pr

## 2016-12-26 NOTE — Telephone Encounter (Signed)
Spoke with patient. She states she has been coughing up green phlegm since Thursday. Runny nose with green mucus as well. Pharmacist recommended Delsym and Robitussin for the cough in which she said does not work for her. She said she has had this in the past before and CY called in a zpak and promethazine cough syrup. She denies any SOB or chest pain.    Patient wishes to use [facility] on [address].   CY, please advise. Thanks!

## 2016-12-26 NOTE — Telephone Encounter (Signed)
Rxs called to pharm and spoke with the pt and notified that this was done

## 2016-12-26 NOTE — Telephone Encounter (Signed)
Patient calling back, states she is spitting up green stuff and coughing up green stuff, coughing is terrible she said.  She is requesting a Zpack and promethazine with codeine.  Pharm is [facility] on [address]  CB [phone]

## 2017-01-02 DIAGNOSIS — J449 Chronic obstructive pulmonary disease, unspecified: Secondary | ICD-10-CM | POA: Diagnosis not present

## 2017-01-09 ENCOUNTER — Other Ambulatory Visit: Payer: Self-pay | Admitting: Internal Medicine

## 2017-01-27 DIAGNOSIS — J449 Chronic obstructive pulmonary disease, unspecified: Secondary | ICD-10-CM | POA: Diagnosis not present

## 2017-02-02 DIAGNOSIS — Z1231 Encounter for screening mammogram for malignant neoplasm of breast: Secondary | ICD-10-CM | POA: Diagnosis not present

## 2017-02-15 DIAGNOSIS — M4722 Other spondylosis with radiculopathy, cervical region: Secondary | ICD-10-CM | POA: Diagnosis not present

## 2017-02-15 DIAGNOSIS — R030 Elevated blood-pressure reading, without diagnosis of hypertension: Secondary | ICD-10-CM | POA: Diagnosis not present

## 2017-02-20 DIAGNOSIS — J449 Chronic obstructive pulmonary disease, unspecified: Secondary | ICD-10-CM | POA: Diagnosis not present

## 2017-03-07 DIAGNOSIS — M542 Cervicalgia: Secondary | ICD-10-CM | POA: Diagnosis not present

## 2017-03-07 DIAGNOSIS — M4722 Other spondylosis with radiculopathy, cervical region: Secondary | ICD-10-CM | POA: Diagnosis not present

## 2017-03-08 ENCOUNTER — Ambulatory Visit: Payer: Medicare Other | Admitting: Internal Medicine

## 2017-03-08 DIAGNOSIS — M4722 Other spondylosis with radiculopathy, cervical region: Secondary | ICD-10-CM | POA: Diagnosis not present

## 2017-03-09 ENCOUNTER — Ambulatory Visit: Payer: Medicare Other | Admitting: Internal Medicine

## 2017-03-13 ENCOUNTER — Ambulatory Visit (INDEPENDENT_AMBULATORY_CARE_PROVIDER_SITE_OTHER): Payer: Medicare Other | Admitting: Internal Medicine

## 2017-03-13 ENCOUNTER — Encounter: Payer: Self-pay | Admitting: Internal Medicine

## 2017-03-13 VITALS — BP 118/58 | HR 77 | Temp 97.8°F | Resp 16 | Ht 66.0 in | Wt 206.0 lb

## 2017-03-13 DIAGNOSIS — M5030 Other cervical disc degeneration, unspecified cervical region: Secondary | ICD-10-CM

## 2017-03-13 DIAGNOSIS — M1711 Unilateral primary osteoarthritis, right knee: Secondary | ICD-10-CM

## 2017-03-13 DIAGNOSIS — M159 Polyosteoarthritis, unspecified: Secondary | ICD-10-CM

## 2017-03-13 DIAGNOSIS — D216 Benign neoplasm of connective and other soft tissue of trunk, unspecified: Secondary | ICD-10-CM | POA: Diagnosis not present

## 2017-03-13 DIAGNOSIS — I10 Essential (primary) hypertension: Secondary | ICD-10-CM | POA: Diagnosis not present

## 2017-03-13 DIAGNOSIS — R7303 Prediabetes: Secondary | ICD-10-CM

## 2017-03-13 DIAGNOSIS — D3617 Benign neoplasm of peripheral nerves and autonomic nervous system of trunk, unspecified: Secondary | ICD-10-CM | POA: Diagnosis not present

## 2017-03-13 DIAGNOSIS — R52 Pain, unspecified: Secondary | ICD-10-CM | POA: Diagnosis not present

## 2017-03-13 DIAGNOSIS — L820 Inflamed seborrheic keratosis: Secondary | ICD-10-CM | POA: Diagnosis not present

## 2017-03-13 DIAGNOSIS — M150 Primary generalized (osteo)arthritis: Secondary | ICD-10-CM

## 2017-03-13 IMAGING — CR DG CHEST 2V
2 series · 2 of 2 positions shown · non-contrast
Comparison: 06/12/2016

CLINICAL DATA: Atelectasis, soreness with surgery

EXAM:
CHEST  2 VIEW

[chest pa]
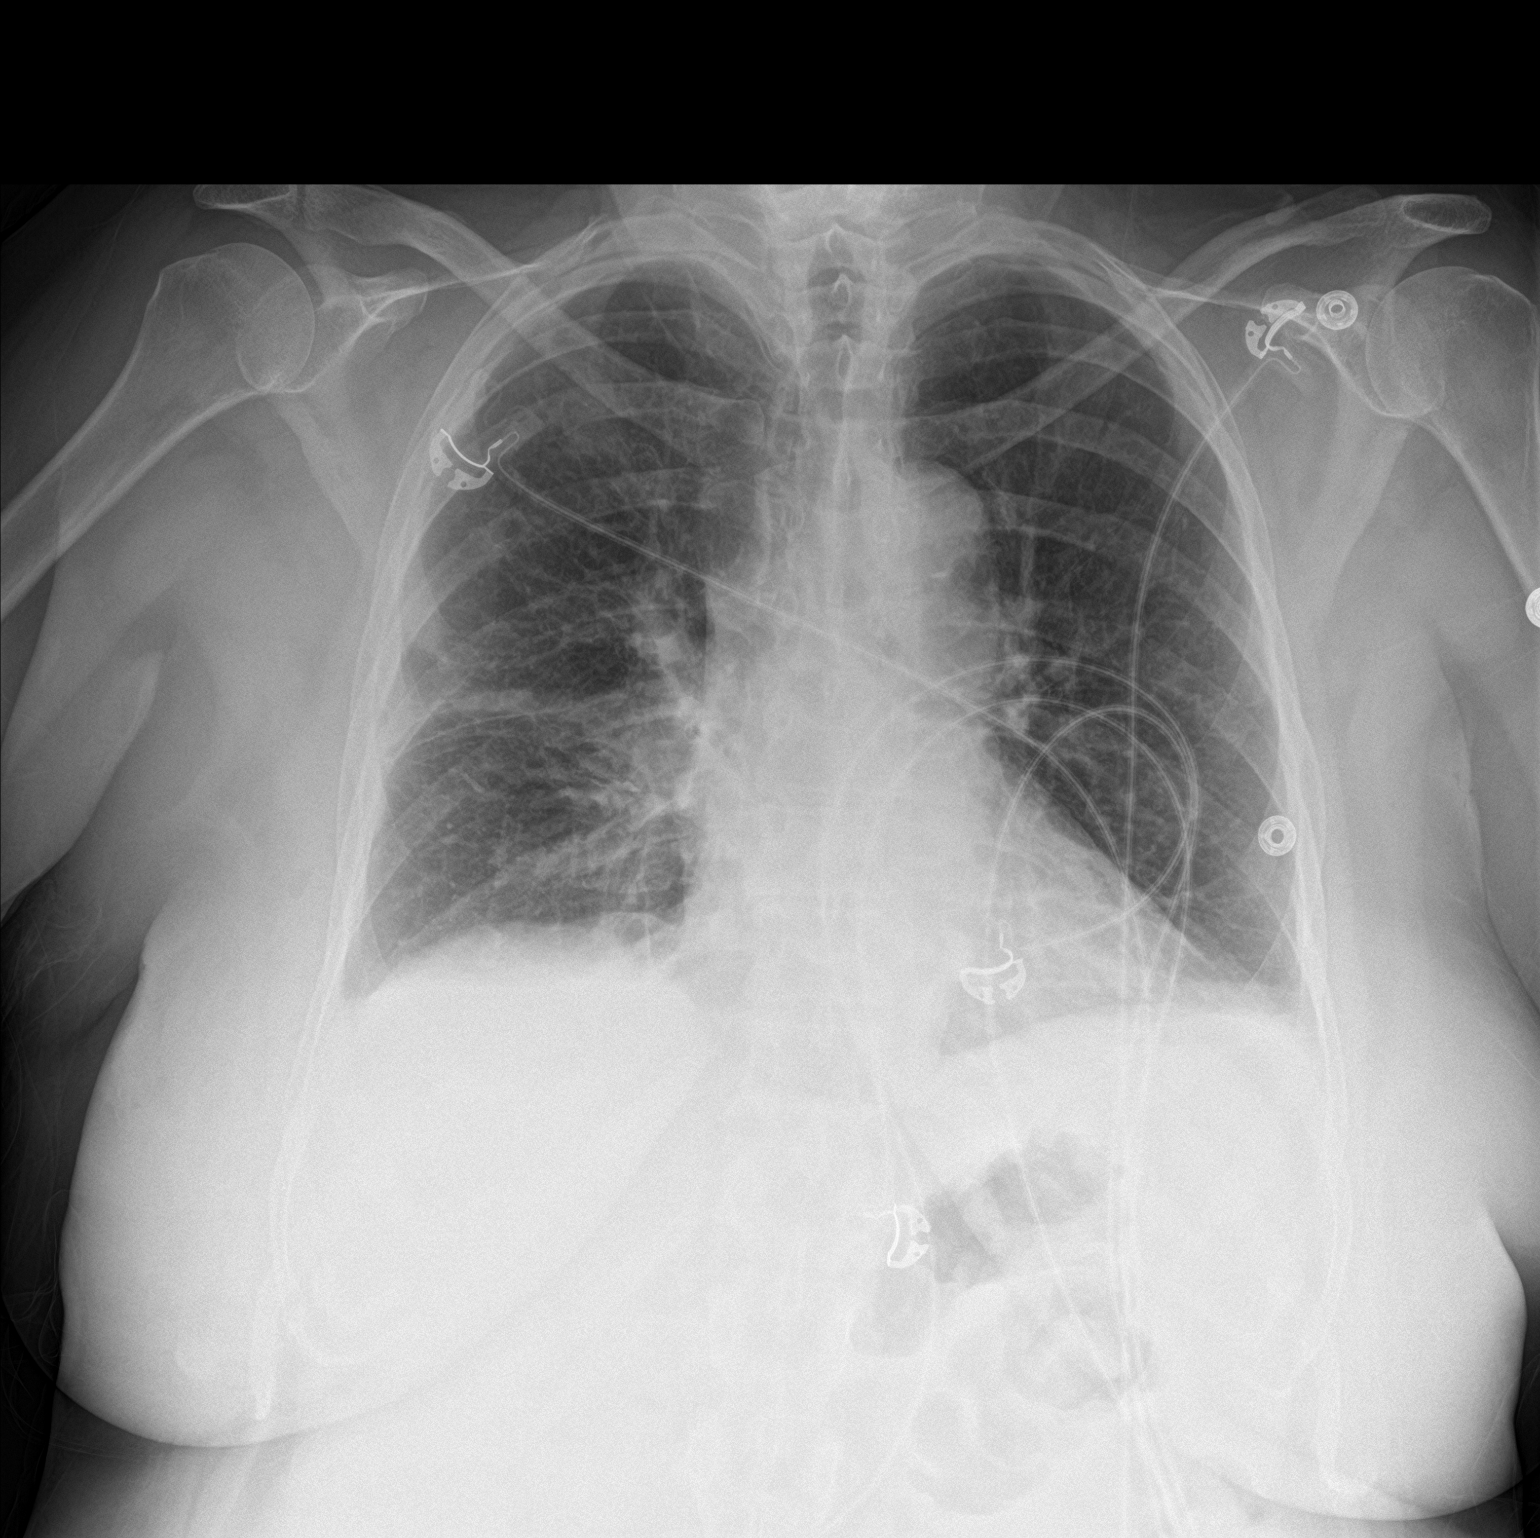

[chest lat]
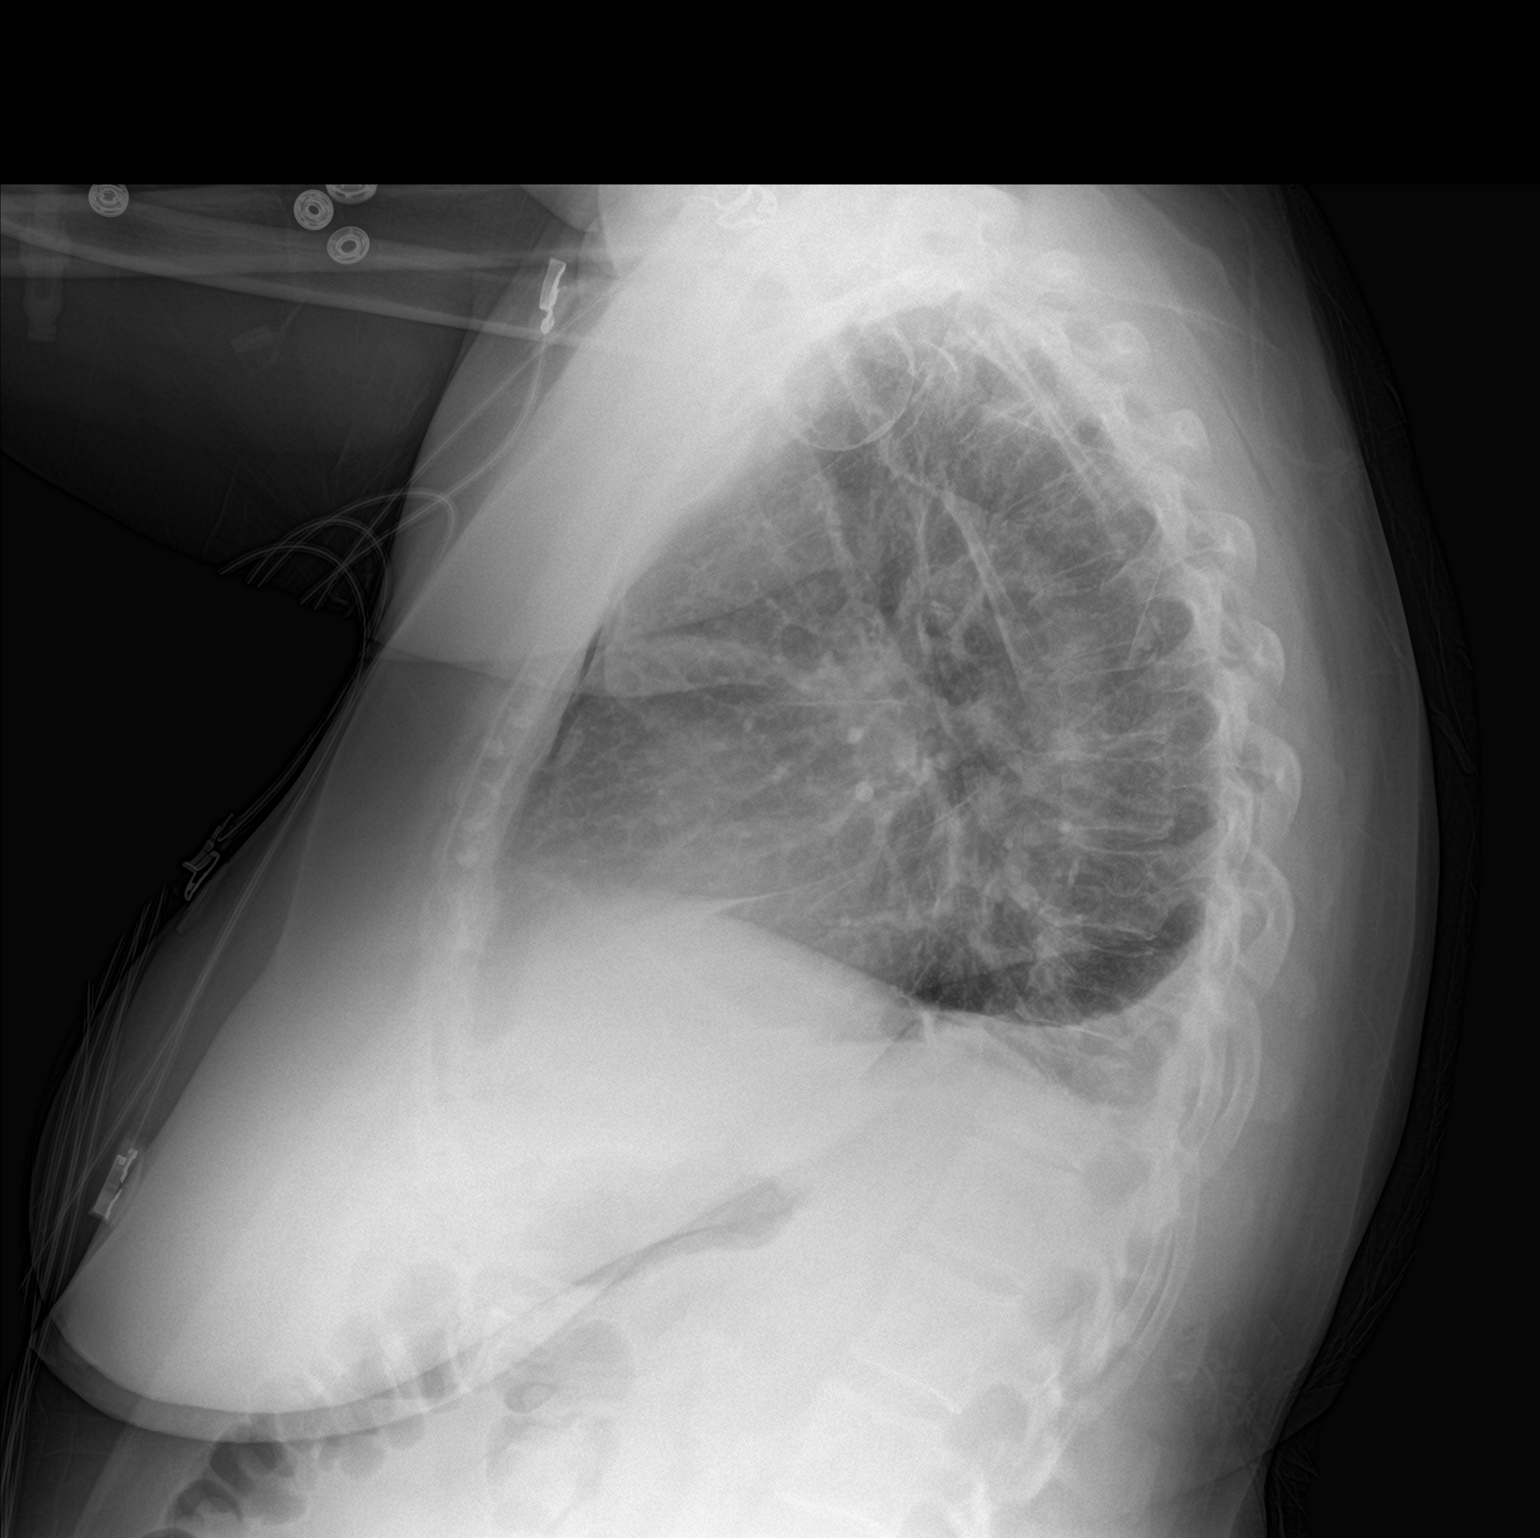

[2 of 2 positions shown; findings below may reference images not displayed]

FINDINGS: Normal cardiac silhouette. There is band of atelectasis in the RIGHT
mid lung not changed from prior. Bibasilar atelectasis small
effusion unchanged. No pneumothorax.
IMPRESSION: 1. No pneumothorax following chest tube removal.
2. Mild basilar atelectasis and small effusion.

## 2017-03-13 MED ORDER — HYDROCODONE-ACETAMINOPHEN 10-325 MG PO TABS
1.0000 | ORAL_TABLET | Freq: Four times a day (QID) | ORAL | 0 refills | Status: DC | PRN
Start: 2017-03-13 — End: 2017-03-13

## 2017-03-13 MED ORDER — HYDROCODONE-ACETAMINOPHEN 10-325 MG PO TABS: 1.0000 | ORAL_TABLET | Freq: Four times a day (QID) | ORAL | 0 refills | Status: DC | PRN

## 2017-03-13 NOTE — Patient Instructions (Signed)
Arthritis Arthritis means joint pain. It can also mean joint disease. A joint is a place where bones come together. People who have arthritis may have:  Red joints.  Swollen joints.  Stiff joints.  Warm joints.  A fever.  A feeling of being sick.  Follow these instructions at home: Pay attention to any changes in your symptoms. Take these actions to help with your pain and swelling. Medicines  Take over-the-counter and prescription medicines only as told by your doctor.  Do not take aspirin for pain if your doctor says that you may have gout. Activity  Rest your joint if your doctor tells you to.  Avoid activities that make the pain worse.  Exercise your joint regularly as told by your doctor. Try doing exercises like: ? Swimming. ? Water aerobics. ? Biking. ? Walking. Joint Care   If your joint is swollen, keep it raised (elevated) if told by your doctor.  If your joint feels stiff in the morning, try taking a warm shower.  If you have diabetes, do not apply heat without asking your doctor.  If told, apply heat to the joint: ? Put a towel between the joint and the hot pack or heating pad. ? Leave the heat on the area for 20-30 minutes.  If told, apply ice to the joint: ? Put ice in a plastic bag. ? Place a towel between your skin and the bag. ? Leave the ice on for 20 minutes, 2-3 times per day.  Keep all follow-up visits as told by your doctor. Contact a doctor if:  The pain gets worse.  You have a fever. Get help right away if:  You have very bad pain in your joint.  You have swelling in your joint.  Your joint is red.  Many joints become painful and swollen.  You have very bad back pain.  Your leg is very weak.  You cannot control your pee (urine) or poop (stool). This information is not intended to replace advice given to you by your health care provider. Make sure you discuss any questions you have with your health care provider. Document  Released: 10/05/2009 Document Revised: 12/17/2015 Document Reviewed: 10/06/2014 [employer] Interactive Patient Education  2018 [employer]  

## 2017-03-13 NOTE — Progress Notes (Signed)
Subjective:  Patient ID: Felicia Odom, female    DOB: [birth year]  Age: 61 y.o. MRN: [id number]  CC: Osteoarthritis   HPI Felicia Odom presents for pain management. Since I last saw her she tells me that she saw a neurosurgeon and had MRI done of her cervical spine. She was told that she had bone spurs and spinal stenosis. The neurosurgeon recommended that she undergo an epidural steroid injection which she has not done yet. She is getting symptom relief with Norco and wants to continue it and needs a refill today. She also complains of chronic, unchanged joint pain.  Outpatient Medications Prior to Visit  Medication Sig Dispense Refill  . albuterol (PROAIR HFA) 108 (90 Base) MCG/ACT inhaler Inhale 1-2 puffs into the lungs every 4 (four) hours as needed for wheezing or shortness of breath. 8.5 Inhaler 12  . albuterol (PROVENTIL) (2.5 MG/3ML) 0.083% nebulizer solution Take 3 mLs (2.5 mg total) by nebulization every 6 (six) hours. Dx COPD with bronchitis 120 mL 5  . ALPRAZolam (XANAX) 1 MG tablet Take 1 tablet (1 mg total) by mouth every 6 (six) hours as needed. 120 tablet 5  . aspirin EC 81 MG tablet Take 81 mg by mouth daily.    Marland Kitchen esomeprazole (NEXIUM) 40 MG capsule TAKE 1 CAPSULE (40 MG TOTAL) BY MOUTH DAILY. 90 capsule 1  . Fluticasone-Salmeterol (ADVAIR DISKUS) 500-50 MCG/DOSE AEPB INHALE 1 PUFF BY MOUTH INTO THE LUNGS EVERY 12 HOURS (RINSE MOUTH AFTERWARDS) (Patient taking differently: INHALE 1 PUFF BY MOUTH INTO THE LUNGS EVERY 12 HOURS (RINSE MOUTH AFTERWARDS) AS NEEDED FOR WHEEZING OR SHORTNESS OF BREATH) 1 each 12  . hydrocortisone 2.5 % cream Apply 1 application topically daily as needed (itching).     Marland Kitchen azithromycin (ZITHROMAX) 250 MG tablet Take 1 tablet (250 mg total) by mouth as directed. 6 tablet 0  . HYDROcodone-acetaminophen (NORCO) 10-325 MG tablet Take 1 tablet by mouth every 6 (six) hours as needed (for pain). 90 tablet 0  . ibuprofen (ADVIL,MOTRIN) 800 MG  tablet Take 800 mg by mouth 3 (three) times daily as needed for moderate pain.     Marland Kitchen ibuprofen (ADVIL,MOTRIN) 800 MG tablet TAKE 1 TABLET BY MOUTH TWICE A DAY WITH FOOD AS NEEDED FOR PAIN 60 tablet 5  . promethazine-codeine (PHENERGAN WITH CODEINE) 6.25-10 MG/5ML syrup Take 5 mLs by mouth every 6 (six) hours as needed for cough. 200 mL 0   No facility-administered medications prior to visit.     ROS Review of Systems  Constitutional: Negative.  Negative for activity change, appetite change, diaphoresis and fatigue.  HENT: Negative for sinus pressure and trouble swallowing.   Eyes: Negative.  Negative for visual disturbance.  Respiratory: Negative.  Negative for cough, chest tightness, shortness of breath and wheezing.   Cardiovascular: Negative for chest pain, palpitations and leg swelling.  Gastrointestinal: Negative for abdominal pain, constipation, diarrhea, nausea and vomiting.  Endocrine: Negative.   Genitourinary: Negative.  Negative for difficulty urinating.  Musculoskeletal: Positive for arthralgias and neck pain. Negative for back pain and neck stiffness.  Skin: Negative.   Allergic/Immunologic: Negative.   Neurological: Negative.  Negative for dizziness, speech difficulty, weakness, numbness and headaches.  Hematological: Negative for adenopathy. Does not bruise/bleed easily.  Psychiatric/Behavioral: Negative.     Objective:  BP (!) 118/58 (BP Location: Left Arm, Patient Position: Sitting, Cuff Size: Normal)   Pulse 77   Temp 97.8 F (36.6 C) (Oral)   Resp 16   Ht 5'  6" (1.676 m)   Wt 206 lb (93.4 kg)   SpO2 98%   BMI 33.25 kg/m   BP Readings from Last 3 Encounters:  03/13/17 (!) 118/58  11/16/16 120/80  11/14/16 124/72    Wt Readings from Last 3 Encounters:  03/13/17 206 lb (93.4 kg)  11/16/16 207 lb (93.9 kg)  11/14/16 201 lb 9.6 oz (91.4 kg)    Physical Exam  Constitutional: She is oriented to person, place, and time. No distress.  HENT:  Mouth/Throat:  Oropharynx is clear and moist. No oropharyngeal exudate.  Eyes: Conjunctivae are normal. Right eye exhibits no discharge. Left eye exhibits no discharge. No scleral icterus.  Neck: Normal range of motion. Neck supple. No JVD present. No thyromegaly present.  Cardiovascular: Normal rate, regular rhythm and intact distal pulses.  Exam reveals no gallop and no friction rub.   No murmur heard. Pulmonary/Chest: Effort normal and breath sounds normal. No respiratory distress. She has no wheezes. She has no rales. She exhibits no tenderness.  Abdominal: Soft. Bowel sounds are normal. She exhibits no distension and no mass. There is no tenderness. There is no rebound and no guarding.  Musculoskeletal: Normal range of motion. She exhibits no edema, tenderness or deformity.  Lymphadenopathy:    She has no cervical adenopathy.  Neurological: She is alert and oriented to person, place, and time.  Skin: Skin is warm and dry. No rash noted. She is not diaphoretic. No erythema. No pallor.  Vitals reviewed.   Lab Results  Component Value Date   WBC 7.2 06/12/2016   HGB 12.1 06/12/2016   HCT 38.4 06/12/2016   PLT 216 06/12/2016   GLUCOSE 129 (H) 06/23/2016   CHOL 182 06/23/2016   TRIG 144.0 06/23/2016   HDL 35.20 (L) 06/23/2016   LDLDIRECT 142.0 01/21/2015   LDLCALC 118 (H) 06/23/2016   ALT 16 06/12/2016   AST 20 06/12/2016   NA 141 06/23/2016   K 3.9 06/23/2016   CL 106 06/23/2016   CREATININE 0.86 06/23/2016   BUN 12 06/23/2016   CO2 26 06/23/2016   TSH 1.08 06/23/2016   INR 0.89 06/08/2016   HGBA1C 6.0 06/23/2016    Dg Bone Density (dxa)  Result Date: 11/17/2016 EXAM: DUAL X-RAY ABSORPTIOMETRY (DXA) FOR BONE MINERAL DENSITY IMPRESSION: Referring Physician:  Criss Alvine MODY PATIENT: Name: Felicia, Odom Patient ID: [id number] Birth Date: 1956-04-06 Height: 65.5 in. Sex: Female Measured: 11/17/2016 Weight: 199.7 lbs. Indications: Caucasian, Estrogen Deficient, Low Calcium Intake  (269.3), Postmenopausal Fractures: None Treatments: Vitamin D (E933.5) ASSESSMENT: The BMD measured at Femur Neck Left is 0.974 g/cm2 with a T-score of -0.5. This patient is considered NORMAL according to [facility]) criteria. L-3 was excluded due to degenerative changes. Per the official positions of the ISCD, it is not possible to quantitatively compare BMD or calculate an [facility] between exams done at different facilities. Site Region Measured Date Measured Age YA BMD Significant CHANGE T-score DualFemur Neck Left  11/17/2016    60.0         -0.5    0.974 g/cm2 AP Spine  L1-L4 (L3) 11/17/2016    61.0         -0.2    1.157 g/cm2 World Health Organization [facility]) criteria for post-menopausal, Caucasian Women: Normal       T-score at or above -1 SD Osteopenia   T-score between -1 and -2.5 SD Osteoporosis T-score at or below -2.5 SD RECOMMENDATION: [place] recommends that FDA-approved medical therapies be  considered in postmenopausal women and men age 35 or older with a: 1. Hip or vertebral (clinical or morphometric) fracture. 2. T-score of <-2.5 at the spine or hip. 3. Ten-year fracture probability by FRAX of 3% or greater for hip fracture or 20% or greater for major osteoporotic fracture. All treatment decisions require clinical judgment and consideration of individual patient factors, including patient preferences, co-morbidities, previous drug use, risk factors not captured in the FRAX model (e.g. falls, vitamin D deficiency, increased bone turnover, interval significant decline in bone density) and possible under - or over-estimation of fracture risk by FRAX. All patients should ensure an adequate intake of dietary calcium (1200 mg/d) and vitamin D (800 IU daily) unless contraindicated. FOLLOW-UP: People with diagnosed cases of osteoporosis or at high risk for fracture should have regular bone mineral density tests. For patients eligible for Medicare, routine testing is  allowed once every 2 years. The testing frequency can be increased to one year for patients who have rapidly progressing disease, those who are receiving or discontinuing medical therapy to restore bone mass, or have additional risk factors. I have reviewed this report, and agree with the above findings. [facility] Radiology Electronically Signed   By: Margarette Canada M.D.   On: 11/17/2016 15:11    Assessment & Plan:   Shunda was seen today for osteoarthritis.  Diagnoses and all orders for this visit:  Essential hypertension- her blood pressures adequately well-controlled  Prediabetes  DDD (degenerative disc disease), cervical -     Discontinue: HYDROcodone-acetaminophen (NORCO) 10-325 MG tablet; Take 1 tablet by mouth every 6 (six) hours as needed (for pain). -     Discontinue: HYDROcodone-acetaminophen (NORCO) 10-325 MG tablet; Take 1 tablet by mouth every 6 (six) hours as needed (for pain). -     Discontinue: HYDROcodone-acetaminophen (NORCO) 10-325 MG tablet; Take 1 tablet by mouth every 6 (six) hours as needed (for pain). -     HYDROcodone-acetaminophen (NORCO) 10-325 MG tablet; Take 1 tablet by mouth every 6 (six) hours as needed (for pain).  Primary osteoarthritis involving multiple joints -     Discontinue: HYDROcodone-acetaminophen (NORCO) 10-325 MG tablet; Take 1 tablet by mouth every 6 (six) hours as needed (for pain). -     Discontinue: HYDROcodone-acetaminophen (NORCO) 10-325 MG tablet; Take 1 tablet by mouth every 6 (six) hours as needed (for pain). -     Discontinue: HYDROcodone-acetaminophen (NORCO) 10-325 MG tablet; Take 1 tablet by mouth every 6 (six) hours as needed (for pain). -     HYDROcodone-acetaminophen (NORCO) 10-325 MG tablet; Take 1 tablet by mouth every 6 (six) hours as needed (for pain).  Primary osteoarthritis of right knee -     Discontinue: HYDROcodone-acetaminophen (NORCO) 10-325 MG tablet; Take 1 tablet by mouth every 6 (six) hours as needed (for pain). -      Discontinue: HYDROcodone-acetaminophen (NORCO) 10-325 MG tablet; Take 1 tablet by mouth every 6 (six) hours as needed (for pain). -     Discontinue: HYDROcodone-acetaminophen (NORCO) 10-325 MG tablet; Take 1 tablet by mouth every 6 (six) hours as needed (for pain). -     HYDROcodone-acetaminophen (NORCO) 10-325 MG tablet; Take 1 tablet by mouth every 6 (six) hours as needed (for pain).   I have discontinued Ms. Devlin's ibuprofen, ibuprofen, azithromycin, and promethazine-codeine. I am also having her maintain her Fluticasone-Salmeterol, albuterol, aspirin EC, hydrocortisone, albuterol, ALPRAZolam, esomeprazole, and HYDROcodone-acetaminophen.  Meds ordered this encounter  Medications  . DISCONTD: HYDROcodone-acetaminophen (NORCO) 10-325 MG tablet    Sig:  Take 1 tablet by mouth every 6 (six) hours as needed (for pain).    Dispense:  90 tablet    Refill:  0  . DISCONTD: HYDROcodone-acetaminophen (NORCO) 10-325 MG tablet    Sig: Take 1 tablet by mouth every 6 (six) hours as needed (for pain).    Dispense:  90 tablet    Refill:  0  . DISCONTD: HYDROcodone-acetaminophen (NORCO) 10-325 MG tablet    Sig: Take 1 tablet by mouth every 6 (six) hours as needed (for pain).    Dispense:  90 tablet    Refill:  0  . HYDROcodone-acetaminophen (NORCO) 10-325 MG tablet    Sig: Take 1 tablet by mouth every 6 (six) hours as needed (for pain).    Dispense:  90 tablet    Refill:  0     Follow-up: Return in about 6 months (around 09/13/2017).  Scarlette Calico, MD

## 2017-03-14 DIAGNOSIS — J449 Chronic obstructive pulmonary disease, unspecified: Secondary | ICD-10-CM | POA: Diagnosis not present

## 2017-03-29 DIAGNOSIS — Z118 Encounter for screening for other infectious and parasitic diseases: Secondary | ICD-10-CM | POA: Diagnosis not present

## 2017-03-29 DIAGNOSIS — Z1159 Encounter for screening for other viral diseases: Secondary | ICD-10-CM | POA: Diagnosis not present

## 2017-04-06 DIAGNOSIS — J449 Chronic obstructive pulmonary disease, unspecified: Secondary | ICD-10-CM | POA: Diagnosis not present

## 2017-04-06 IMAGING — CR DG CHEST 2V
2 series · 2 of 2 positions shown · non-contrast
Comparison: [DATE]

CLINICAL DATA: Post right video assisted thoracotomy [REDACTED]

EXAM:
CHEST  2 VIEW

[w chest pa]
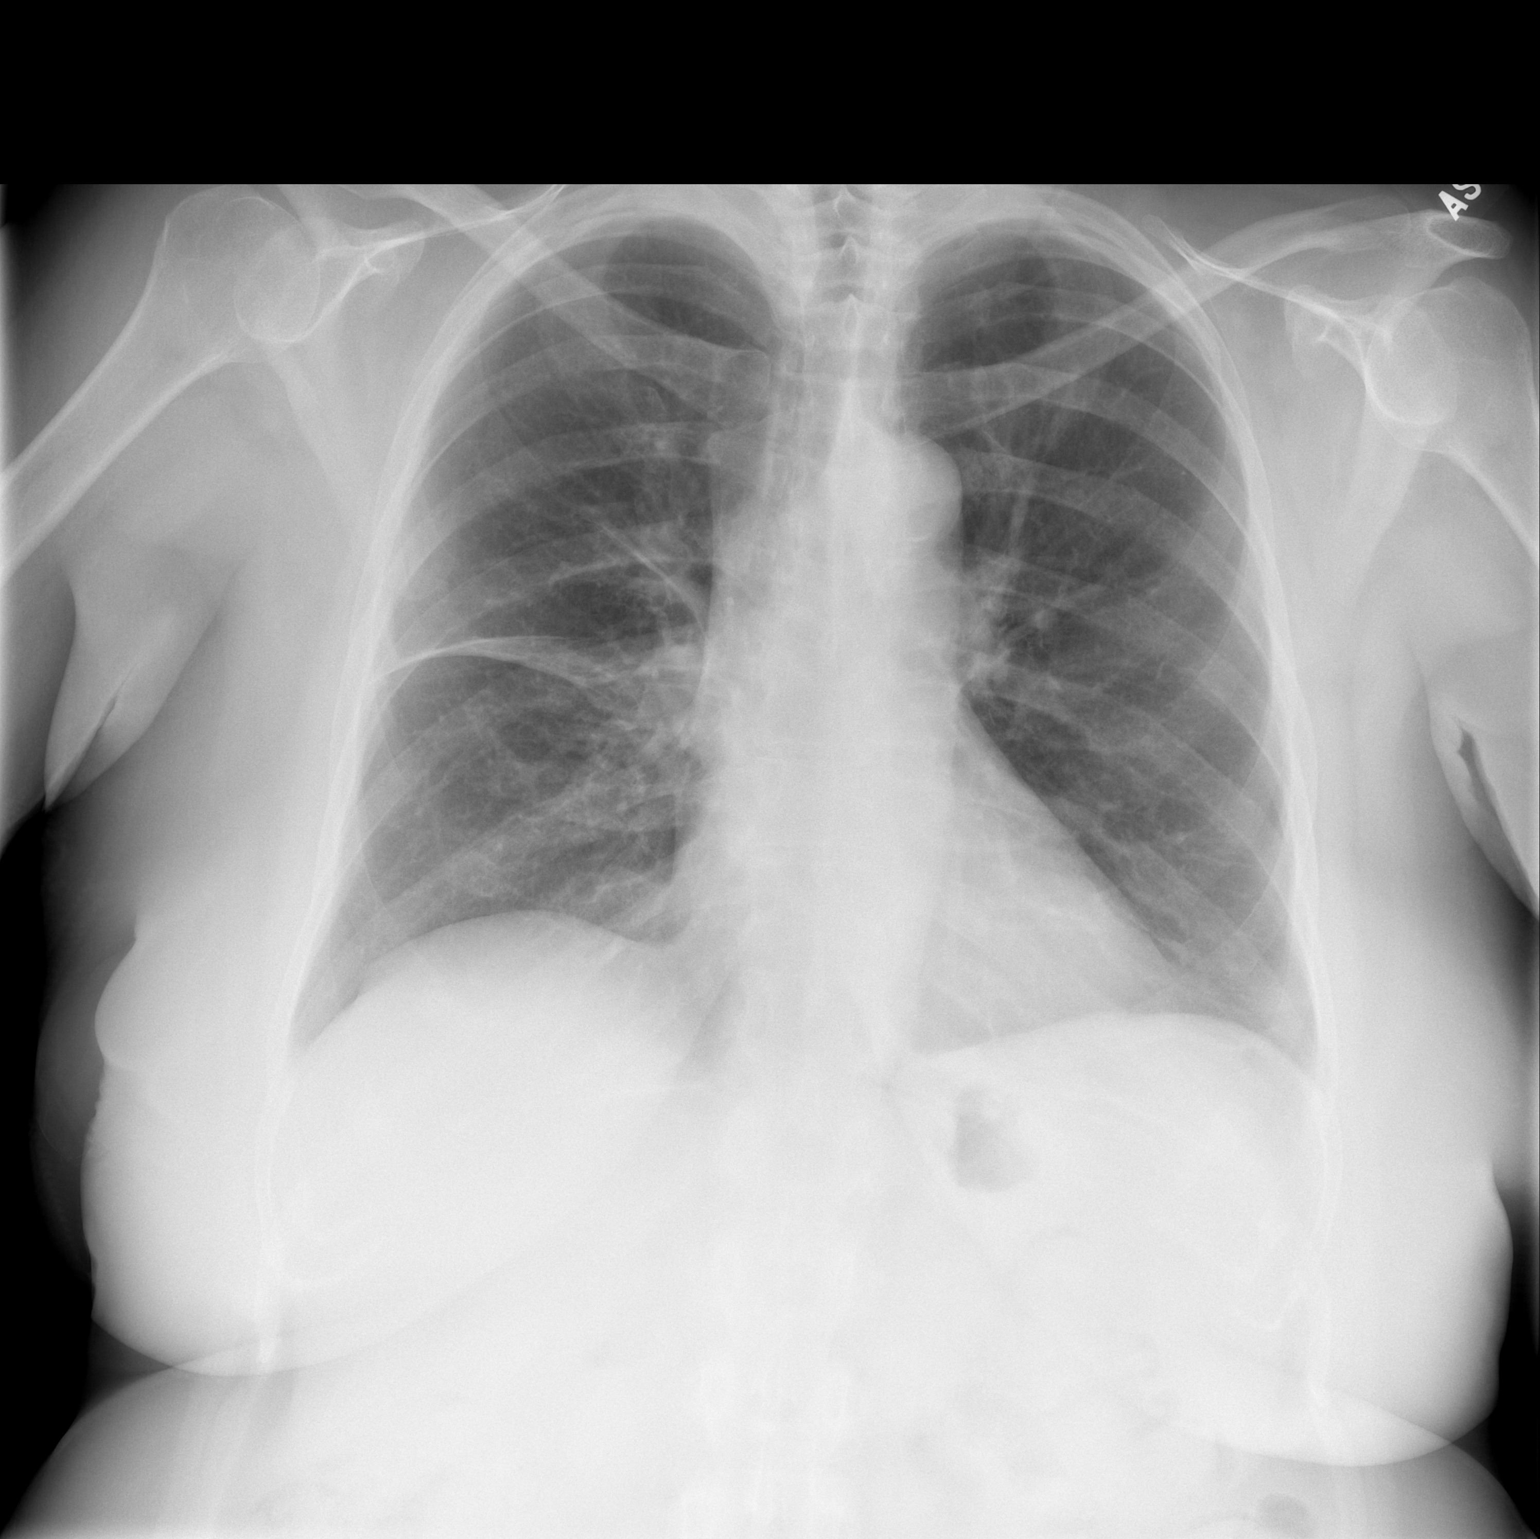

[w chest lat]
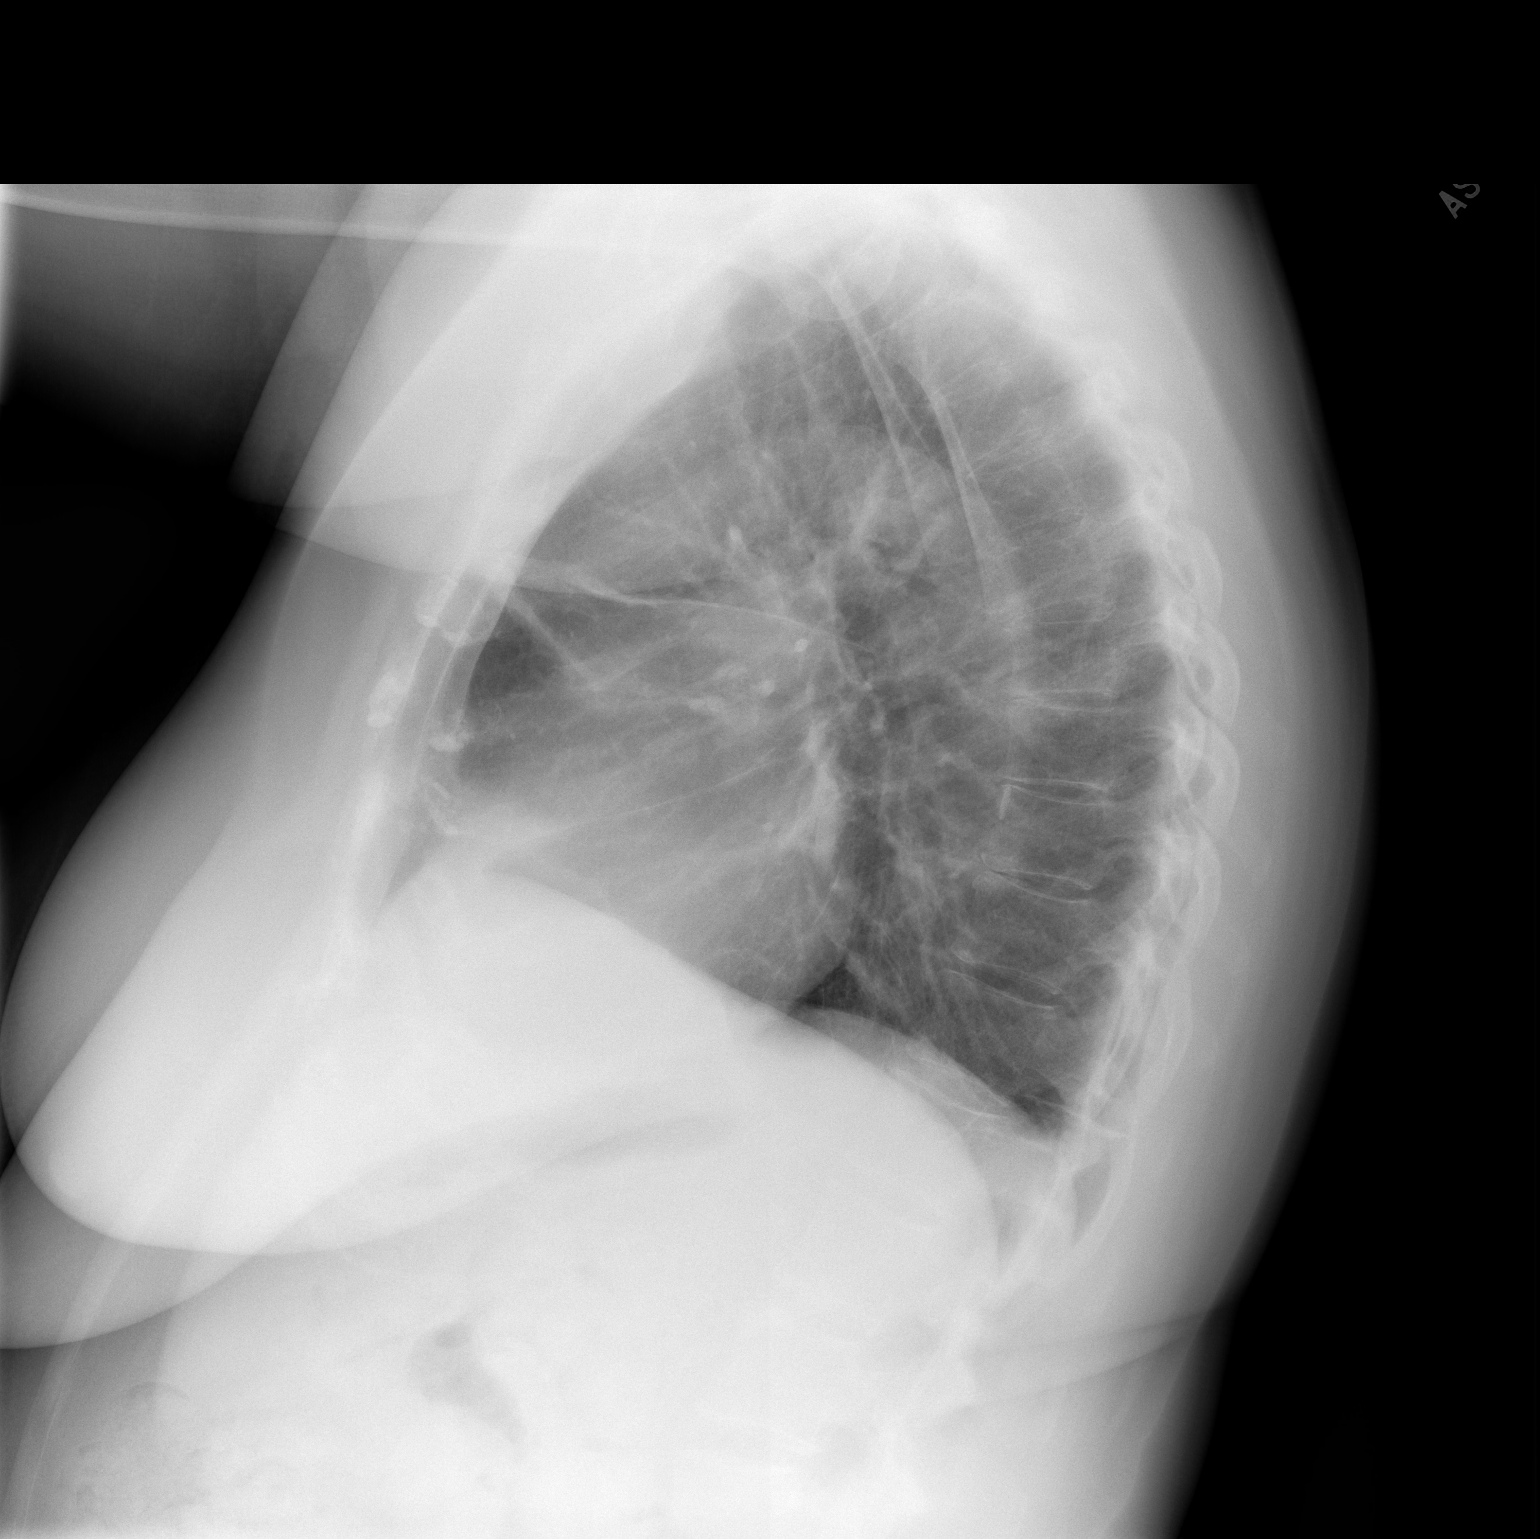

[2 of 2 positions shown; findings below may reference images not displayed]

FINDINGS: Cardiomediastinal silhouette is stable. Stable right perihilar
atelectasis or scarring. No infiltrate or pulmonary edema. Mild
degenerative changes thoracic spine.
IMPRESSION: Stable right perihilar atelectasis or scarring. No superimposed
infiltrate or pulmonary edema.

## 2017-05-01 DIAGNOSIS — J449 Chronic obstructive pulmonary disease, unspecified: Secondary | ICD-10-CM | POA: Diagnosis not present

## 2017-05-18 ENCOUNTER — Other Ambulatory Visit: Payer: Self-pay | Admitting: Cardiothoracic Surgery

## 2017-05-18 ENCOUNTER — Encounter: Payer: Self-pay | Admitting: Internal Medicine

## 2017-05-18 ENCOUNTER — Ambulatory Visit (INDEPENDENT_AMBULATORY_CARE_PROVIDER_SITE_OTHER): Payer: Medicare Other | Admitting: Internal Medicine

## 2017-05-18 VITALS — BP 110/58 | HR 110 | Ht 66.0 in | Wt 202.0 lb

## 2017-05-18 DIAGNOSIS — R000 Tachycardia, unspecified: Secondary | ICD-10-CM | POA: Diagnosis not present

## 2017-05-18 DIAGNOSIS — J4521 Mild intermittent asthma with (acute) exacerbation: Secondary | ICD-10-CM

## 2017-05-18 DIAGNOSIS — F410 Panic disorder [episodic paroxysmal anxiety] without agoraphobia: Secondary | ICD-10-CM

## 2017-05-18 DIAGNOSIS — J9859 Other diseases of mediastinum, not elsewhere classified: Secondary | ICD-10-CM

## 2017-05-18 MED ORDER — AMOXICILLIN 500 MG PO TABS: 500.0000 mg | ORAL_TABLET | Freq: Two times a day (BID) | ORAL | 0 refills | Status: DC

## 2017-05-18 MED ORDER — ALPRAZOLAM 1 MG PO TABS: 1.0000 mg | ORAL_TABLET | Freq: Four times a day (QID) | ORAL | 5 refills | Status: DC | PRN

## 2017-05-18 MED ORDER — PROMETHAZINE-CODEINE 6.25-10 MG/5ML PO SYRP: 5.0000 mL | ORAL_SOLUTION | Freq: Four times a day (QID) | ORAL | 0 refills | Status: DC | PRN

## 2017-05-18 NOTE — Patient Instructions (Signed)
Script sent for amoxacillin and printed refill for alprazolam  Please call as needed

## 2017-05-18 NOTE — Progress Notes (Signed)
Patient ID: Felicia Odom, female    DOB: August 11, 1955, 61 y.o.   MRN: [id number]  HPI F former smoker followed for COPD, Allergic rhinitis, complicated by Thymoma/ resected, anxiety Office spirometry- 01/29/15 Normal spirometry. FVC 2.74/81%, FEV1 2.11/79%, FEV1/FVC 0.77, FEF 25-75 percent 1.79 Office Spirometry 10/21/2015-slight restriction of exhaled volume, mild obstruction. FVC 2.69/78%, FEV1 2.04/76%, FEV1/FVC 0.76, FEF 25-75 percent 1.68/65% PFT 04/27/2016-minimal obstructive airways disease, minimal diffusion defect, insignificant response to bronchodilator. FVC 2.79/78%, FEV1 2.11/76%, ratio 0.76, FEF 25-75 percent 1.81/72%, TLC 92%, DLCO 76%. .-------------------------------------------------------------------------------------------------------  11/14/16- 61 year old female former smoker followed for asthma/bronchitis, allergic rhinitis, complicated by thymoma/ TSGY, OCD/panic, anxiety, GERD 4 month f/u. Pt. states her breathing has been fine. Residual pleuritic right parasternal pain since R chest surgery for thymoma. We discussed pain medicine and chronic anxiety. She has we refill Xanax which we have given her in the past. Discussed having PCP refer her to pain clinic. Occasional minor cough with no acute respiratory problems this spring, little wheeze. Has her inhalers but not needing rescue inhaler every day. CXR 10/06/16 IMPRESSION: Minimal subsegmental atelectasis in the lingula. Interval clearing of thickening of the minor fissure. No acute pneumonia. No pulmonary or mediastinal mass is observed. Thoracic aortic atherosclerosis.  05/18/17- 61 year old female former smoker followed for asthma/bronchitis, allergic rhinitis, complicated by thymoma/ TSGY, OCD/panic, anxiety, GERD Pt states that her breathing is so-so; has had some SOB with wheezing. States that she is spitting up green-yellow mucus and also has it coming out of her nose thinking she has some kind of  infection. Denies any CP. Declines flu shot. Irregular pulse noted by nurse on arrival-EKG- 05/18/17- sinus tach 107/minute  she is partially edentulous and dentist is working on a partial plate today, with another extraction. Had some palpitation last week with no chest pain or syncope. Occasional hot flashes? Residual from menopause. Admits several cups of coffee daily Asthmatic bronchitis control is been comfortable. Uses nebulizer up to twice daily if needed and pro air about twice daily. Continues Advair 500. Requests refill cough syrup to keep it available-discussed. Requests refill alprazolam for occasional use for anxiety.  Review of Systems- see HPI + = positive Constitutional:   No-   weight loss, +night sweats, fevers, chills, fatigue, lassitude. HEENT:   No-  headaches, difficulty swallowing, tooth/dental problems, sore throat,       No-  sneezing, itching, ear ache, nasal congestion, post nasal drip,  CV:  No- chest pain, No-orthopnea, PND, swelling in lower extremities, anasarca, dizziness, palpitations Resp: + shortness of breath with exertion or at rest.              productive cough,   +non-productive cough,  No- coughing up of blood.            change in color of mucus.  No- wheezing.   Skin: No-   rash or lesions. GI:    heartburn, indigestion, No-abdominal pain, nausea, vomiting,  GU:  MS:  + joint pain or swelling.  . Neuro-     nothing unusual Psych: +change in mood or affect. +Chronic depression , + anxiety.  No memory loss.   Objective:   Physical Exam General- Alert, Oriented , Distress- none acute, + Overweight,  Skin- rash-none, lesions- none, excoriation- none Lymphadenopathy- none Head- atraumatic            Eyes- Gross vision intact, PERRLA, conjunctivae clear secretions            Ears- Hearing, canals-normal  Nose- No- rhinorrhea, no-Septal dev,, polyps, erosion, perforation             Throat- Mallampati II , mucosa clear , drainage- none,  tonsils- atrophic. +Missing teeth Neck- flexible , trachea midline, no stridor , thyroid nl, carotid no bruit Chest - symmetrical excursion , unlabored           Heart/CV- RRR , no murmur , no gallop, no rub, nl s1 s2                           - JVD- none , edema- none, stasis changes- none, varices- none           Lung- wheeze+ Slight, cough-none , dullness-none, rub- none,            Chest wall-  R VATS scars, some tenderness to pressure. No rub. Abd-  Br/ Gen/ Rectal- Not done, not indicated Extrem-  Neuro- grossly intact to observation

## 2017-05-20 DIAGNOSIS — J4520 Mild intermittent asthma, uncomplicated: Secondary | ICD-10-CM | POA: Insufficient documentation

## 2017-05-20 NOTE — Assessment & Plan Note (Signed)
Mild nonspecific exacerbation may be early infection. Her current meds are appropriate but we will give antibiotic to hold since the weekend is coming. Plan-amoxicillin to hold with discussion

## 2017-05-20 NOTE — Assessment & Plan Note (Signed)
Discussed refill Xanax for occasional use. She feels anxiety attacks are a trigger for wheezing and dyspnea exacerbations which otherwise lead to heavy use of bronchodilators episodically.

## 2017-05-24 DIAGNOSIS — J449 Chronic obstructive pulmonary disease, unspecified: Secondary | ICD-10-CM | POA: Diagnosis not present

## 2017-05-29 ENCOUNTER — Other Ambulatory Visit: Payer: Self-pay | Admitting: Internal Medicine

## 2017-05-29 DIAGNOSIS — M159 Polyosteoarthritis, unspecified: Secondary | ICD-10-CM

## 2017-05-29 DIAGNOSIS — M150 Primary generalized (osteo)arthritis: Principal | ICD-10-CM

## 2017-05-29 DIAGNOSIS — M5030 Other cervical disc degeneration, unspecified cervical region: Secondary | ICD-10-CM

## 2017-06-01 ENCOUNTER — Ambulatory Visit: Payer: Medicare Other | Admitting: Cardiothoracic Surgery

## 2017-06-09 ENCOUNTER — Telehealth: Payer: Self-pay | Admitting: Internal Medicine

## 2017-06-09 MED ORDER — PROMETHAZINE-CODEINE 6.25-10 MG/5ML PO SYRP: 5.0000 mL | ORAL_SOLUTION | Freq: Four times a day (QID) | ORAL | 0 refills | Status: DC | PRN

## 2017-06-09 MED ORDER — AZITHROMYCIN 250 MG PO TABS: ORAL_TABLET | ORAL | 0 refills | Status: AC

## 2017-06-09 NOTE — Telephone Encounter (Signed)
Spoke with the pt  She states she took her amoxicillin right after last visit here on 05/18/17 and is still coughing up green sputum  She states that she is also out of the phenergan with codeine syrup # 200 ml that we gave on 05/18/17  She denies any increased SOB, wheezing, chest tightness, f/c/s  She states that she is needing a zpack to clear her cough up and also wants syrup refilled  Next ov 09/21/16  She is having a chest ct 06/29/17  Allergies  Allergen Reactions  . No Known Allergies    Current Outpatient Medications on File Prior to Visit  Medication Sig Dispense Refill  . albuterol (PROAIR HFA) 108 (90 Base) MCG/ACT inhaler Inhale 1-2 puffs into the lungs every 4 (four) hours as needed for wheezing or shortness of breath. 8.5 Inhaler 12  . albuterol (PROVENTIL) (2.5 MG/3ML) 0.083% nebulizer solution Take 3 mLs (2.5 mg total) by nebulization every 6 (six) hours. Dx COPD with bronchitis 120 mL 5  . ALPRAZolam (XANAX) 1 MG tablet Take 1 tablet (1 mg total) by mouth every 6 (six) hours as needed. 120 tablet 5  . amoxicillin (AMOXIL) 500 MG tablet Take 1 tablet (500 mg total) by mouth 2 (two) times daily. 14 tablet 0  . aspirin EC 81 MG tablet Take 81 mg by mouth daily.    Marland Kitchen esomeprazole (NEXIUM) 40 MG capsule TAKE 1 CAPSULE (40 MG TOTAL) BY MOUTH DAILY. 90 capsule 1  . Fluticasone-Salmeterol (ADVAIR DISKUS) 500-50 MCG/DOSE AEPB INHALE 1 PUFF BY MOUTH INTO THE LUNGS EVERY 12 HOURS (RINSE MOUTH AFTERWARDS) (Patient taking differently: INHALE 1 PUFF BY MOUTH INTO THE LUNGS EVERY 12 HOURS (RINSE MOUTH AFTERWARDS) AS NEEDED FOR WHEEZING OR SHORTNESS OF BREATH) 1 each 12  . HYDROcodone-acetaminophen (NORCO) 10-325 MG tablet Take 1 tablet by mouth every 6 (six) hours as needed (for pain). 90 tablet 0  . hydrocortisone 2.5 % cream Apply 1 application topically daily as needed (itching).     Marland Kitchen ibuprofen (ADVIL,MOTRIN) 800 MG tablet TAKE 1 TABLET BY MOUTH TWICE A DAY WITH FOOD AS NEEDED FOR PAIN 60  tablet 5  . promethazine-codeine (PHENERGAN WITH CODEINE) 6.25-10 MG/5ML syrup Take 5 mLs by mouth every 6 (six) hours as needed for cough. 200 mL 0  . [DISCONTINUED] estradiol (VIVELLE-DOT) 0.0375 MG/24HR Place 1 patch onto the skin 2 (two) times a week.      . [DISCONTINUED] ipratropium (ATROVENT) 0.02 % nebulizer solution Take 2.5 mLs (500 mcg total) by nebulization 4 (four) times daily. DX:  496 300 mL 5  . [DISCONTINUED] progesterone (PROMETRIUM) 100 MG capsule Take 100 mg by mouth daily.       No current facility-administered medications on file prior to visit.

## 2017-06-09 NOTE — Telephone Encounter (Signed)
Ok Zpak 250 mg, # 6, 2 today then one daily  Ok to refill her cough syrup

## 2017-06-09 NOTE — Telephone Encounter (Signed)
Pt is aware of CY's recommendations and voiced her understanding.  Rx for Zpak has been sent to preferred pharmacy. Phenergan with Codeine has been phoned in to [place]  at [place] on [address].  Nothing further needed.

## 2017-06-19 DIAGNOSIS — J449 Chronic obstructive pulmonary disease, unspecified: Secondary | ICD-10-CM | POA: Diagnosis not present

## 2017-06-21 DIAGNOSIS — D485 Neoplasm of uncertain behavior of skin: Secondary | ICD-10-CM | POA: Diagnosis not present

## 2017-06-21 DIAGNOSIS — B079 Viral wart, unspecified: Secondary | ICD-10-CM | POA: Diagnosis not present

## 2017-06-21 DIAGNOSIS — L738 Other specified follicular disorders: Secondary | ICD-10-CM | POA: Diagnosis not present

## 2017-06-26 ENCOUNTER — Telehealth: Payer: Self-pay | Admitting: Internal Medicine

## 2017-06-26 MED ORDER — PROMETHAZINE-CODEINE 6.25-10 MG/5ML PO SYRP: 5.0000 mL | ORAL_SOLUTION | Freq: Four times a day (QID) | ORAL | 0 refills | Status: DC | PRN

## 2017-06-26 NOTE — Telephone Encounter (Signed)
Called and spoke with pt. Pt is requesting refill for Phenergan with Codeine to help with cough, as she is scheduled for MRI tomorrow.   CY please advise. Thanks.

## 2017-06-26 NOTE — Telephone Encounter (Signed)
Rx for Phenergan with Codeine has been phoned in to preferred pharmacy.  lmtcb x1 to make pt aware.

## 2017-06-26 NOTE — Telephone Encounter (Signed)
Ok to refill 

## 2017-06-27 NOTE — Telephone Encounter (Signed)
Called spoke with patient, she is aware Rx has been telephoned to pharmacy Nothing further needed; will sign off

## 2017-06-29 ENCOUNTER — Ambulatory Visit: Payer: Medicare Other | Admitting: Cardiothoracic Surgery

## 2017-06-29 ENCOUNTER — Ambulatory Visit
Admission: RE | Admit: 2017-06-29 | Discharge: 2017-06-29 | Disposition: A | Discharge status: Home / Self Care | Payer: Medicare Other | Source: Ambulatory Visit | Attending: Cardiothoracic Surgery | Admitting: Cardiothoracic Surgery

## 2017-06-29 DIAGNOSIS — J9859 Other diseases of mediastinum, not elsewhere classified: Secondary | ICD-10-CM

## 2017-06-29 DIAGNOSIS — R918 Other nonspecific abnormal finding of lung field: Secondary | ICD-10-CM | POA: Diagnosis not present

## 2017-07-04 ENCOUNTER — Ambulatory Visit: Payer: Medicare Other | Admitting: Internal Medicine

## 2017-07-06 ENCOUNTER — Ambulatory Visit (INDEPENDENT_AMBULATORY_CARE_PROVIDER_SITE_OTHER): Payer: Medicare Other | Admitting: Internal Medicine

## 2017-07-06 ENCOUNTER — Other Ambulatory Visit (INDEPENDENT_AMBULATORY_CARE_PROVIDER_SITE_OTHER): Payer: Medicare Other

## 2017-07-06 ENCOUNTER — Ambulatory Visit (INDEPENDENT_AMBULATORY_CARE_PROVIDER_SITE_OTHER): Payer: Medicare Other | Admitting: Cardiothoracic Surgery

## 2017-07-06 ENCOUNTER — Other Ambulatory Visit: Payer: Self-pay

## 2017-07-06 ENCOUNTER — Encounter: Payer: Self-pay | Admitting: Internal Medicine

## 2017-07-06 ENCOUNTER — Encounter: Payer: Self-pay | Admitting: Cardiothoracic Surgery

## 2017-07-06 VITALS — BP 134/82 | HR 79 | Ht 66.0 in | Wt 206.0 lb

## 2017-07-06 VITALS — BP 132/84 | HR 88 | Temp 97.7°F | Resp 16 | Ht 66.0 in | Wt 208.0 lb

## 2017-07-06 DIAGNOSIS — Z79891 Long term (current) use of opiate analgesic: Secondary | ICD-10-CM

## 2017-07-06 DIAGNOSIS — M5030 Other cervical disc degeneration, unspecified cervical region: Secondary | ICD-10-CM

## 2017-07-06 DIAGNOSIS — K2100 Gastro-esophageal reflux disease with esophagitis, without bleeding: Secondary | ICD-10-CM

## 2017-07-06 DIAGNOSIS — J9859 Other diseases of mediastinum, not elsewhere classified: Secondary | ICD-10-CM | POA: Diagnosis not present

## 2017-07-06 DIAGNOSIS — M150 Primary generalized (osteo)arthritis: Secondary | ICD-10-CM | POA: Diagnosis not present

## 2017-07-06 DIAGNOSIS — J4520 Mild intermittent asthma, uncomplicated: Secondary | ICD-10-CM | POA: Diagnosis not present

## 2017-07-06 DIAGNOSIS — D150 Benign neoplasm of thymus: Secondary | ICD-10-CM

## 2017-07-06 DIAGNOSIS — E785 Hyperlipidemia, unspecified: Secondary | ICD-10-CM | POA: Diagnosis not present

## 2017-07-06 DIAGNOSIS — I10 Essential (primary) hypertension: Secondary | ICD-10-CM

## 2017-07-06 DIAGNOSIS — N951 Menopausal and female climacteric states: Secondary | ICD-10-CM | POA: Diagnosis not present

## 2017-07-06 DIAGNOSIS — R05 Cough: Secondary | ICD-10-CM

## 2017-07-06 DIAGNOSIS — R7303 Prediabetes: Secondary | ICD-10-CM

## 2017-07-06 DIAGNOSIS — I700 Atherosclerosis of aorta: Secondary | ICD-10-CM

## 2017-07-06 DIAGNOSIS — R059 Cough, unspecified: Secondary | ICD-10-CM

## 2017-07-06 DIAGNOSIS — Z09 Encounter for follow-up examination after completed treatment for conditions other than malignant neoplasm: Secondary | ICD-10-CM | POA: Diagnosis not present

## 2017-07-06 DIAGNOSIS — M1711 Unilateral primary osteoarthritis, right knee: Secondary | ICD-10-CM | POA: Diagnosis not present

## 2017-07-06 DIAGNOSIS — M159 Polyosteoarthritis, unspecified: Secondary | ICD-10-CM

## 2017-07-06 HISTORY — DX: Long term (current) use of opiate analgesic: Z79.891

## 2017-07-06 LAB — CBC WITH DIFFERENTIAL/PLATELET
BASOS PCT: 1.3 % (ref 0.0–3.0)
Basophils Absolute: 0.1 10*3/uL (ref 0.0–0.1)
EOS PCT: 2.7 % (ref 0.0–5.0)
Eosinophils Absolute: 0.2 10*3/uL (ref 0.0–0.7)
HCT: 44.3 % (ref 36.0–46.0)
HEMOGLOBIN: 14.7 g/dL (ref 12.0–15.0)
Lymphocytes Relative: 27.1 % (ref 12.0–46.0)
Lymphs Abs: 2.2 10*3/uL (ref 0.7–4.0)
MCHC: 33.3 g/dL (ref 30.0–36.0)
MCV: 86.7 fl (ref 78.0–100.0)
MONOS PCT: 8.8 % (ref 3.0–12.0)
Monocytes Absolute: 0.7 10*3/uL (ref 0.1–1.0)
Neutro Abs: 4.9 10*3/uL (ref 1.4–7.7)
Neutrophils Relative %: 60.1 % (ref 43.0–77.0)
Platelets: 284 10*3/uL (ref 150.0–400.0)
RBC: 5.11 Mil/uL (ref 3.87–5.11)
RDW: 14.1 % (ref 11.5–15.5)
WBC: 8.1 10*3/uL (ref 4.0–10.5)

## 2017-07-06 LAB — POCT EXHALED NITRIC OXIDE: FeNO level (ppb): 9

## 2017-07-06 LAB — HEMOGLOBIN A1C: HEMOGLOBIN A1C: 6.2 % (ref 4.6–6.5)

## 2017-07-06 MED ORDER — HYDROCODONE-ACETAMINOPHEN 10-325 MG PO TABS: 1.0000 | ORAL_TABLET | Freq: Four times a day (QID) | ORAL | 0 refills | Status: DC | PRN

## 2017-07-06 MED ORDER — FLUTICASONE FUROATE-VILANTEROL 100-25 MCG/INH IN AEPB: 1.0000 | INHALATION_SPRAY | Freq: Every day | RESPIRATORY_TRACT | 1 refills | Status: DC

## 2017-07-06 NOTE — Patient Instructions (Signed)

## 2017-07-06 NOTE — Progress Notes (Signed)
[address]       [place] [address]             [phone]      Felicia Odom [facility] Medical Record #[id number] Date of Birth: Oct 03, 1955  Referring: Felicia Lever, MD Primary Care: Felicia Lima, MD  Chief Complaint:   POST OP FOLLOW UP 06/10/2016 OPERATIVE REPORT PREOPERATIVE DIAGNOSIS:  Anterior mediastinal mass. 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 PROCEDURE PERFORMED:  Bronchoscopy, right video-assisted thoracoscopy, resection of anterior mediastinal tumor SURGEON:  Felicia Bal, MD. 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 - SEE COMMENT. Microscopic Comment Felicia Odom has reviewed the case and concurs with this interpretation. (Felicia Odom:gt, 06/13/16) Felicia Cutter MD  History of Present Illness:     Patient returns to the office after  right video-assisted thoracoscopy and resection of anterior mediastinal tumor which on final path turned out to be a thymoma, at time of resection there was no indication that this was a malignant process.  Patient smoked for many years, she said " everyone working at [facility] smoked and no studies"   Past Medical History:  Diagnosis Date  . Arthritis    neck  . Asthma   . Chronic back pain   . Chronic bronchitis ([place])   . Chronic neck pain   . COPD (chronic obstructive pulmonary disease) ([facility])    Advair and Albuterol as needed as well as neb  . GERD (gastroesophageal reflux disease)    takes Nexium daily  . History of blood transfusion    in the 70's. No abnormal reaction noted  . History of colon polyps    benign  . Hyperlipidemia    not on any meds bc refused to pick up from pharmacy  . Joint pain   . Joint swelling   . Mediastinal tumor   . Nocturia   . OCD (obsessive compulsive disorder)   . Panic disorder    takes Xanax daily as needed  . Pneumonia    2016  . PONV  (postoperative nausea and vomiting)   . Seizures ([place])    in the 80's     Social History   Tobacco Use  Smoking Status Former Smoker  . Packs/day: 1.00  . Years: 20.00  . Pack years: 20.00  . Types: Cigarettes  . Last attempt to quit: 05/18/2002  . Years since quitting: 15.1  Smokeless Tobacco Never Used  Tobacco Comment   quit smoking 79yrs ago    Social History   Substance and Sexual Activity  Alcohol Use No  . Alcohol/week: 0.0 oz     Allergies  Allergen Reactions  . No Known Allergies     Current Outpatient Medications  Medication Sig Dispense Refill  . albuterol (PROAIR HFA) 108 (90 Base) MCG/ACT inhaler Inhale 1-2 puffs into the lungs every 4 (four) hours as needed for wheezing or shortness of breath. 8.5 Inhaler 12  . albuterol (PROVENTIL) (2.5 MG/3ML) 0.083% nebulizer solution Take 3 mLs (2.5 mg total) by nebulization every 6 (six) hours. Dx COPD with bronchitis 120 mL 5  . ALPRAZolam (XANAX) 1 MG tablet Take 1 tablet (1 mg total) by mouth every 6 (six) hours as needed. 120 tablet 5  . aspirin EC 81 MG tablet Take 81 mg by mouth daily.    Marland Kitchen esomeprazole (NEXIUM) 40 MG capsule TAKE 1 CAPSULE (40 MG TOTAL) BY MOUTH DAILY.  90 capsule 1  . Fluticasone-Salmeterol (ADVAIR DISKUS) 500-50 MCG/DOSE AEPB INHALE 1 PUFF BY MOUTH INTO THE LUNGS EVERY 12 HOURS (RINSE MOUTH AFTERWARDS) (Patient taking differently: INHALE 1 PUFF BY MOUTH INTO THE LUNGS EVERY 12 HOURS (RINSE MOUTH AFTERWARDS) AS NEEDED FOR WHEEZING OR SHORTNESS OF BREATH) 1 each 12  . hydrocortisone 2.5 % cream Apply 1 application topically daily as needed (itching).     Marland Kitchen ibuprofen (ADVIL,MOTRIN) 800 MG tablet TAKE 1 TABLET BY MOUTH TWICE A DAY WITH FOOD AS NEEDED FOR PAIN 60 tablet 5  . promethazine-codeine (PHENERGAN WITH CODEINE) 6.25-10 MG/5ML syrup Take 5 mLs by mouth every 6 (six) hours as needed for cough. 200 mL 0  . HYDROcodone-acetaminophen (NORCO) 10-325 MG tablet Take 1 tablet by mouth every 6 (six)  hours as needed (for pain). (Patient not taking: Reported on 07/06/2017) 90 tablet 0   No current facility-administered medications for this visit.        Physical Exam: BP 134/82 (BP Location: Left Arm, Patient Position: Sitting, Cuff Size: Large)   Pulse 79   Ht 5\' 6"  (1.676 m)   Wt 206 lb (93.4 kg)   SpO2 98%   BMI 33.25 kg/m  General appearance: alert and cooperative Head: Normocephalic, without obvious abnormality, atraumatic Neck: no adenopathy, no carotid bruit, no JVD, supple, symmetrical, trachea midline and thyroid not enlarged, symmetric, no tenderness/mass/nodules Lymph nodes: Cervical, supraclavicular, and axillary nodes normal. Resp: clear to auscultation bilaterally Back: symmetric, no curvature. ROM normal. No CVA tenderness. Cardio: regular rate and rhythm, S1, S2 normal, no murmur, click, rub or gallop GI: soft, non-tender; bowel sounds normal; no masses,  no organomegaly Extremities: extremities normal, atraumatic, no cyanosis or edema and Homans sign is negative, no sign of DVT Neurologic: Grossly normal  Diagnostic Studies & Laboratory data:     Recent Radiology Findings:  Ct Chest Wo Contrast  Result Date: 06/30/2017 CLINICAL DATA:  Followup mediastinal mass EXAM: CT CHEST WITHOUT CONTRAST TECHNIQUE: Multidetector CT imaging of the chest was performed following the standard protocol without IV contrast. COMPARISON:  04/21/2016 FINDINGS: Cardiovascular: The heart size appears normal. No pericardial effusion identified. Mild aortic atherosclerosis. Mediastinum/Nodes: The trachea is patent and midline. Nodule in right lobe of thyroid gland is unchanged at 2 cm. The previously noted anterior mediastinal soft tissue nodule is no longer present. 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 No mediastinal or hilar adenopathy. The esophagus is unremarkable. Lungs/Pleura: Scarring noted within the anterior right upper lobe. Scar discs shaped nodule along the oblique  fissure measures 9 by 0.4 cm and is favored to represent a benign intrapulmonary lymph node. Upper Abdomen: No acute abnormality. Musculoskeletal: No chest wall mass or suspicious bone lesions identified. IMPRESSION: 1. No acute cardiopulmonary abnormalities. 2. No evidence for residual or recurrence of anterior mediastinal soft tissue nodule/mass. 3.  Aortic Atherosclerosis (ICD10-I70.0). Electronically Signed   By: Kerby Moors M.D.   On: 06/30/2017 08:33   I have independently reviewed the above radiology studies  and reviewed the findings with the patient.   Recent Lab Findings: Lab Results  Component Value Date   WBC 7.2 06/12/2016   HGB 12.1 06/12/2016   HCT 38.4 06/12/2016   PLT 216 06/12/2016   GLUCOSE 129 (H) 06/23/2016   CHOL 182 06/23/2016   TRIG 144.0 06/23/2016   HDL 35.20 (L) 06/23/2016   LDLDIRECT 142.0 01/21/2015   LDLCALC 118 (H) 06/23/2016   ALT 16 06/12/2016   AST 20 06/12/2016   NA 141 06/23/2016  K 3.9 06/23/2016   CL 106 06/23/2016   CREATININE 0.86 06/23/2016   BUN 12 06/23/2016   CO2 26 06/23/2016   TSH 1.08 06/23/2016   INR 0.89 06/08/2016   HGBA1C 6.0 06/23/2016      Assessment / Plan:   Patient status post resection of thymoma, 1 year ago without evidence of recurrence With the patient's long standing smoking history will refer her to the lung cancer screening program for serial CT scanning low-dose as part of the screening program The patient continues to be followed in the pulmonary clinic and has appointment in February.  I am not made a return appointment to see me but would be glad to see her, and her or the request by pulmonology.   Grace Isaac MD      [address] [place] [address] Office [phone]   [facility] [phone]  07/06/2017 2:45 PM

## 2017-07-06 NOTE — Progress Notes (Signed)
Subjective:  Patient ID: Felicia Odom, female    DOB: 1956/03/06  Age: 61 y.o. MRN: [id number]  CC: Cough and Osteoarthritis   HPI CHYLER CREELY presents for concerns about a recent cough.  The cough is nonproductive but it is associated with wheezing.  She is using her albuterol inhaler frequently.  She is not compliant with the Advair Diskus. She denies any recent episodes of chest pain, hemoptysis, shortness of breath, fever, chills, or fatigue.  She does complain of night sweats and hot flashes but she has had those for many years.  She wants to know if there is a treatment option for the hot flashes.  She also complains of chronic pain and wants a refill on Norco so that she can do her activities of daily living.  Outpatient Medications Prior to Visit  Medication Sig Dispense Refill  . albuterol (PROAIR HFA) 108 (90 Base) MCG/ACT inhaler Inhale 1-2 puffs into the lungs every 4 (four) hours as needed for wheezing or shortness of breath. 8.5 Inhaler 12  . albuterol (PROVENTIL) (2.5 MG/3ML) 0.083% nebulizer solution Take 3 mLs (2.5 mg total) by nebulization every 6 (six) hours. Dx COPD with bronchitis 120 mL 5  . ALPRAZolam (XANAX) 1 MG tablet Take 1 tablet (1 mg total) by mouth every 6 (six) hours as needed. 120 tablet 5  . aspirin EC 81 MG tablet Take 81 mg by mouth daily.    Marland Kitchen esomeprazole (NEXIUM) 40 MG capsule TAKE 1 CAPSULE (40 MG TOTAL) BY MOUTH DAILY. 90 capsule 1  . hydrocortisone 2.5 % cream Apply 1 application topically daily as needed (itching).     Marland Kitchen ibuprofen (ADVIL,MOTRIN) 800 MG tablet TAKE 1 TABLET BY MOUTH TWICE A DAY WITH FOOD AS NEEDED FOR PAIN 60 tablet 5  . Fluticasone-Salmeterol (ADVAIR DISKUS) 500-50 MCG/DOSE AEPB INHALE 1 PUFF BY MOUTH INTO THE LUNGS EVERY 12 HOURS (RINSE MOUTH AFTERWARDS) (Patient taking differently: INHALE 1 PUFF BY MOUTH INTO THE LUNGS EVERY 12 HOURS (RINSE MOUTH AFTERWARDS) AS NEEDED FOR WHEEZING OR SHORTNESS OF BREATH) 1 each 12    . HYDROcodone-acetaminophen (NORCO) 10-325 MG tablet Take 1 tablet by mouth every 6 (six) hours as needed (for pain). 90 tablet 0  . promethazine-codeine (PHENERGAN WITH CODEINE) 6.25-10 MG/5ML syrup Take 5 mLs by mouth every 6 (six) hours as needed for cough. 200 mL 0   No facility-administered medications prior to visit.     ROS Review of Systems  Constitutional: Negative for chills, diaphoresis, fatigue and fever.       She complains of hot flashes  HENT: Negative.  Negative for facial swelling, sinus pressure, sore throat and trouble swallowing.   Eyes: Negative.   Respiratory: Positive for cough and wheezing. Negative for choking, chest tightness, shortness of breath and stridor.   Cardiovascular: Negative.  Negative for chest pain, palpitations and leg swelling.  Gastrointestinal: Negative for abdominal pain, constipation, diarrhea, nausea and vomiting.  Endocrine: Negative.   Genitourinary: Negative.  Negative for difficulty urinating.  Musculoskeletal: Positive for arthralgias and neck pain. Negative for back pain, joint swelling and myalgias.  Skin: Negative.  Negative for color change and rash.  Allergic/Immunologic: Negative.   Neurological: Negative.  Negative for dizziness.  Hematological: Negative for adenopathy. Does not bruise/bleed easily.  Psychiatric/Behavioral: Negative for confusion, decreased concentration, dysphoric mood, self-injury, sleep disturbance and suicidal ideas. The patient is nervous/anxious.     Objective:  BP 132/84 (BP Location: Right Arm, Patient Position: Sitting, Cuff Size: Large)  Pulse 88   Temp 97.7 F (36.5 C) (Oral)   Resp 16   Ht 5\' 6"  (1.676 m)   Wt 208 lb (94.3 kg)   SpO2 99%   BMI 33.57 kg/m   BP Readings from Last 3 Encounters:  07/06/17 132/84  07/06/17 134/82  05/18/17 (!) 110/58    Wt Readings from Last 3 Encounters:  07/06/17 208 lb (94.3 kg)  07/06/17 206 lb (93.4 kg)  05/18/17 202 lb (91.6 kg)    Physical Exam   Constitutional: She is oriented to person, place, and time. No distress.  HENT:  Mouth/Throat: Oropharynx is clear and moist. No oropharyngeal exudate.  Eyes: Conjunctivae are normal. Left eye exhibits no discharge. No scleral icterus.  Neck: Normal range of motion. Neck supple. No JVD present. No thyromegaly present.  Cardiovascular: Normal rate, regular rhythm and normal heart sounds.  No murmur heard. Pulmonary/Chest: Effort normal and breath sounds normal. No respiratory distress. She has no wheezes. She has no rales.  Abdominal: Soft. Bowel sounds are normal. She exhibits no distension and no mass. There is no tenderness. There is no guarding.  Musculoskeletal: Normal range of motion. She exhibits no edema, tenderness or deformity.  Lymphadenopathy:    She has no cervical adenopathy.  Neurological: She is alert and oriented to person, place, and time.  Skin: Skin is warm and dry. No rash noted. She is not diaphoretic. No erythema. No pallor.  Psychiatric: She has a normal mood and affect. Her behavior is normal. Judgment and thought content normal.  Vitals reviewed.   Lab Results  Component Value Date   WBC 8.1 07/06/2017   HGB 14.7 07/06/2017   HCT 44.3 07/06/2017   PLT 284.0 07/06/2017   GLUCOSE 115 (H) 07/06/2017   CHOL 194 07/06/2017   TRIG 170.0 (H) 07/06/2017   HDL 40.40 07/06/2017   LDLDIRECT 142.0 01/21/2015   LDLCALC 120 (H) 07/06/2017   ALT 28 07/06/2017   AST 28 07/06/2017   NA 138 07/06/2017   K 3.9 07/06/2017   CL 103 07/06/2017   CREATININE 0.73 07/06/2017   BUN 10 07/06/2017   CO2 28 07/06/2017   TSH 3.07 07/06/2017   INR 0.89 06/08/2016   HGBA1C 6.2 07/06/2017    Ct Chest Wo Contrast  Result Date: 06/30/2017 CLINICAL DATA:  Followup mediastinal mass EXAM: CT CHEST WITHOUT CONTRAST TECHNIQUE: Multidetector CT imaging of the chest was performed following the standard protocol without IV contrast. COMPARISON:  04/21/2016 FINDINGS: Cardiovascular: The  heart size appears normal. No pericardial effusion identified. Mild aortic atherosclerosis. Mediastinum/Nodes: The trachea is patent and midline. Nodule in right lobe of thyroid gland is unchanged at 2 cm. The previously noted anterior mediastinal soft tissue nodule is no longer present. 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 No mediastinal or hilar adenopathy. The esophagus is unremarkable. Lungs/Pleura: Scarring noted within the anterior right upper lobe. Scar discs shaped nodule along the oblique fissure measures 9 by 0.4 cm and is favored to represent a benign intrapulmonary lymph node. Upper Abdomen: No acute abnormality. Musculoskeletal: No chest wall mass or suspicious bone lesions identified. IMPRESSION: 1. No acute cardiopulmonary abnormalities. 2. No evidence for residual or recurrence of anterior mediastinal soft tissue nodule/mass. 3.  Aortic Atherosclerosis (ICD10-I70.0). Electronically Signed   By: Kerby Moors M.D.   On: 06/30/2017 08:33    Assessment & Plan:   Vernecia was seen today for cough and osteoarthritis.  Diagnoses and all orders for this visit:  Essential hypertension- Her blood pressure  is well controlled.  Electrolytes and renal function are normal. -     Comprehensive metabolic panel; Future -     TSH; Future  Atherosclerosis of aorta ([place])- Statin therapy is not indicated.  Will continue a baby aspirin a day. -     Lipid panel; Future  Hyperlipidemia with target LDL less than 100- She has a low Framingham risk score so I do not recommend statin therapy at this time. -     Lipid panel; Future -     TSH; Future  Prediabetes- Her A1c is up to 6.2%.  She is prediabetic.  Medical therapy is not indicated.  She agrees to work on her lifestyle modifications. -     Hemoglobin A1c; Future  Gastroesophageal reflux disease with esophagitis- Her symptoms are well controlled.  Will continue the PPI. -     CBC with Differential/Platelet; Future  Encounter for  long-term opiate analgesic use- Will check a urine drug screen to screen for compliance and to monitor for substance abuse. -     Pain Mgmt, Profile 8 w/Conf, U; Future  Cough-symptoms are consistent with undertreated asthma. -     POCT EXHALED NITRIC OXIDE  Mild intermittent asthma without complication- Her FeNO score is not elevated so I elected not to give her any systemic steroids.  I have asked her to upgrade to a once a day LABA/ICS inhaler to improve symptom relief and to allow her to use the quick acting beta agonist less frequently. -     fluticasone furoate-vilanterol (BREO ELLIPTA) 100-25 MCG/INH AEPB; Inhale 1 puff into the lungs daily.  DDD (degenerative disc disease), cervical -     HYDROcodone-acetaminophen (NORCO) 10-325 MG tablet; Take 1 tablet by mouth every 6 (six) hours as needed (for pain).  Primary osteoarthritis involving multiple joints -     HYDROcodone-acetaminophen (NORCO) 10-325 MG tablet; Take 1 tablet by mouth every 6 (six) hours as needed (for pain).  Primary osteoarthritis of right knee -     HYDROcodone-acetaminophen (NORCO) 10-325 MG tablet; Take 1 tablet by mouth every 6 (six) hours as needed (for pain).  Hot flashes, menopausal- Genestein should help with this -     Dietary Management Product (FOSTEUM PLUS) CAPS; Take 1 capsule by mouth 2 (two) times daily.   I have discontinued Camiyah Friberg. Smelcer's Fluticasone-Salmeterol and promethazine-codeine. I am also having her start on fluticasone furoate-vilanterol and FOSTEUM PLUS. Additionally, I am having her maintain her albuterol, aspirin EC, hydrocortisone, albuterol, esomeprazole, ALPRAZolam, ibuprofen, and HYDROcodone-acetaminophen.  Meds ordered this encounter  Medications  . fluticasone furoate-vilanterol (BREO ELLIPTA) 100-25 MCG/INH AEPB    Sig: Inhale 1 puff into the lungs daily.    Dispense:  90 each    Refill:  1  . HYDROcodone-acetaminophen (NORCO) 10-325 MG tablet    Sig: Take 1 tablet by  mouth every 6 (six) hours as needed (for pain).    Dispense:  90 tablet    Refill:  0  . Dietary Management Product (FOSTEUM PLUS) CAPS    Sig: Take 1 capsule by mouth 2 (two) times daily.    Dispense:  60 capsule    Refill:  11     Follow-up: Return in about 4 months (around 11/04/2017).  Scarlette Calico, MD

## 2017-07-07 DIAGNOSIS — N951 Menopausal and female climacteric states: Secondary | ICD-10-CM | POA: Insufficient documentation

## 2017-07-07 LAB — LIPID PANEL
CHOLESTEROL: 194 mg/dL (ref 0–200)
HDL: 40.4 mg/dL (ref >39.00)
LDL Cholesterol: 120 mg/dL — ABNORMAL HIGH (ref 0–99)
NonHDL: 153.96
TRIGLYCERIDES: 170 mg/dL — AB (ref 0.0–149.0)
Total CHOL/HDL Ratio: 5
VLDL: 34 mg/dL (ref 0.0–40.0)

## 2017-07-07 LAB — COMPREHENSIVE METABOLIC PANEL
ALBUMIN: 4 g/dL (ref 3.5–5.2)
ALK PHOS: 73 U/L (ref 39–117)
ALT: 28 U/L (ref 0–35)
AST: 28 U/L (ref 0–37)
BUN: 10 mg/dL (ref 6–23)
CHLORIDE: 103 meq/L (ref 96–112)
CO2: 28 mEq/L (ref 19–32)
Calcium: 8.8 mg/dL (ref 8.4–10.5)
Creatinine, Ser: 0.73 mg/dL (ref 0.40–1.20)
GFR: 86.22 mL/min (ref >60.00)
Glucose, Bld: 115 mg/dL — ABNORMAL HIGH (ref 70–99)
POTASSIUM: 3.9 meq/L (ref 3.5–5.1)
Sodium: 138 mEq/L (ref 135–145)
TOTAL PROTEIN: 7.5 g/dL (ref 6.0–8.3)
Total Bilirubin: 0.5 mg/dL (ref 0.2–1.2)

## 2017-07-07 LAB — TSH: TSH: 3.07 u[IU]/mL (ref 0.35–4.50)

## 2017-07-07 MED ORDER — FOSTEUM PLUS PO CAPS: 1.0000 | ORAL_CAPSULE | Freq: Two times a day (BID) | ORAL | 11 refills | Status: DC

## 2017-07-11 ENCOUNTER — Encounter: Payer: Self-pay | Admitting: Internal Medicine

## 2017-07-11 LAB — PAIN MGMT, PROFILE 8 W/CONF, U
6 ACETYLMORPHINE: NEGATIVE ng/mL (ref <10)
ALPHAHYDROXYTRIAZOLAM: NEGATIVE ng/mL (ref <50)
AMPHETAMINES: NEGATIVE ng/mL (ref <500)
Alcohol Metabolites: NEGATIVE ng/mL (ref <500)
Alphahydroxymidazolam: NEGATIVE ng/mL (ref <50)
Aminoclonazepam: NEGATIVE ng/mL (ref <25)
BENZODIAZEPINES: POSITIVE ng/mL — AB (ref <100)
Buprenorphine, Urine: NEGATIVE ng/mL (ref <5)
CODEINE: NEGATIVE ng/mL (ref <50)
Cocaine Metabolite: NEGATIVE ng/mL (ref <150)
Creatinine: 109.7 mg/dL
HYDROMORPHONE: 420 ng/mL — AB (ref <50)
HYDROXYETHYLFLURAZEPAM: NEGATIVE ng/mL (ref <50)
Hydrocodone: 918 ng/mL — ABNORMAL HIGH (ref <50)
Lorazepam: NEGATIVE ng/mL (ref <50)
MARIJUANA METABOLITE: POSITIVE ng/mL — AB (ref <20)
MDMA: NEGATIVE ng/mL (ref <500)
Marijuana Metabolite: 899 ng/mL — ABNORMAL HIGH (ref <5)
Morphine: NEGATIVE ng/mL (ref <50)
NORDIAZEPAM: NEGATIVE ng/mL (ref <50)
NORHYDROCODONE: 1216 ng/mL — AB (ref <50)
OPIATES: POSITIVE ng/mL — AB (ref <100)
OXAZEPAM: NEGATIVE ng/mL (ref <50)
OXYCODONE: NEGATIVE ng/mL (ref <100)
Oxidant: NEGATIVE ug/mL (ref <200)
Temazepam: NEGATIVE ng/mL (ref <50)
pH: 6.02 (ref 4.5–9.0)

## 2017-07-12 DIAGNOSIS — J449 Chronic obstructive pulmonary disease, unspecified: Secondary | ICD-10-CM | POA: Diagnosis not present

## 2017-07-13 ENCOUNTER — Ambulatory Visit: Payer: Medicare Other | Admitting: Internal Medicine

## 2017-07-17 ENCOUNTER — Other Ambulatory Visit: Payer: Self-pay | Admitting: Internal Medicine

## 2017-07-19 ENCOUNTER — Other Ambulatory Visit: Payer: Self-pay | Admitting: Internal Medicine

## 2017-07-19 DIAGNOSIS — M150 Primary generalized (osteo)arthritis: Principal | ICD-10-CM

## 2017-07-19 DIAGNOSIS — M5030 Other cervical disc degeneration, unspecified cervical region: Secondary | ICD-10-CM

## 2017-07-19 DIAGNOSIS — M159 Polyosteoarthritis, unspecified: Secondary | ICD-10-CM

## 2017-07-19 MED ORDER — IBUPROFEN 800 MG PO TABS: ORAL_TABLET | ORAL | 1 refills | Status: DC

## 2017-08-03 DIAGNOSIS — J449 Chronic obstructive pulmonary disease, unspecified: Secondary | ICD-10-CM | POA: Diagnosis not present

## 2017-08-04 ENCOUNTER — Telehealth: Payer: Self-pay | Admitting: Internal Medicine

## 2017-08-04 NOTE — Telephone Encounter (Signed)
Check [place] registry last filled 07/06/2017.Marland KitchenJohny Chess

## 2017-08-04 NOTE — Telephone Encounter (Signed)
Copied from [place] [phone]. Topic: Quick Communication - Rx Refill/Question >> Aug 04, 2017  8:53 AM Lolita Rieger, RMA wrote: Medication: Norco 10-325mg    Has the patient contacted their pharmacy? no   (Agent: If no, request that the patient contact the pharmacy for the refill.)   [facility] Pharm[facility] (with phone number [facility] [address]   Agent: Please be advised that RX refills may take up to 3 business days. We ask that you follow-up with your pharmacy.

## 2017-08-04 NOTE — Telephone Encounter (Signed)
Copied from [place] [phone]. Topic: Quick Communication - Rx Refill/Question >> Aug 04, 2017  8:53 AM Lolita Rieger, RMA wrote: Medication: Norco 10-325mg    Has the patient contacted their pharmacy? no   (Agent: If no, request that the patient contact the pharmacy for the refill.)   [facility] [address]   Agent: Please be advised that RX refills may take up to 3 business days. We ask that you follow-up with your pharmacy.

## 2017-08-07 ENCOUNTER — Other Ambulatory Visit: Payer: Self-pay | Admitting: Internal Medicine

## 2017-08-07 DIAGNOSIS — M159 Polyosteoarthritis, unspecified: Secondary | ICD-10-CM

## 2017-08-07 DIAGNOSIS — M150 Primary generalized (osteo)arthritis: Secondary | ICD-10-CM

## 2017-08-07 DIAGNOSIS — M1711 Unilateral primary osteoarthritis, right knee: Secondary | ICD-10-CM

## 2017-08-07 DIAGNOSIS — M5030 Other cervical disc degeneration, unspecified cervical region: Secondary | ICD-10-CM

## 2017-08-07 MED ORDER — HYDROCODONE-ACETAMINOPHEN 10-325 MG PO TABS: 1.0000 | ORAL_TABLET | Freq: Four times a day (QID) | ORAL | 0 refills | Status: DC | PRN

## 2017-08-07 NOTE — Telephone Encounter (Signed)
Pt calling back to check on refill status. Pt states she is out

## 2017-08-07 NOTE — Telephone Encounter (Signed)
Pt called stating her pharmacy has faxed the form for the PA twice today and would like this started as soona possible because she is in pain, I explained the PA process and we would have to wait until we get an answer but she would like this started today if possible,

## 2017-08-07 NOTE — Telephone Encounter (Signed)
Patient called stating that this prescription is needing a prior authorization.

## 2017-08-07 NOTE — Telephone Encounter (Signed)
Notified pt [facility].../lmb

## 2017-08-07 NOTE — Telephone Encounter (Signed)
Checking on her refill. Advised her of the 3 day policy. Told her to follow up with pharmacy

## 2017-08-08 ENCOUNTER — Telehealth: Payer: Self-pay | Admitting: Internal Medicine

## 2017-08-08 NOTE — Telephone Encounter (Signed)
Pt called stating she is needing a new Rx of her promethazine with codeine cough medicine.  Med last filled 07/06/17 #287mL with 0RF. Pt last seen at office 05/18/17.  Dr. Annamaria Boots, please advise if it is okay for [place] to refill this for pt.  Thanks!   Allergies  Allergen Reactions  . No Known Allergies      Current Outpatient Medications:  .  albuterol (PROAIR HFA) 108 (90 Base) MCG/ACT inhaler, Inhale 1-2 puffs into the lungs every 4 (four) hours as needed for wheezing or shortness of breath., Disp: 8.5 Inhaler, Rfl: 12 .  albuterol (PROVENTIL) (2.5 MG/3ML) 0.083% nebulizer solution, Take 3 mLs (2.5 mg total) by nebulization every 6 (six) hours. Dx COPD with bronchitis, Disp: 120 mL, Rfl: 5 .  ALPRAZolam (XANAX) 1 MG tablet, Take 1 tablet (1 mg total) by mouth every 6 (six) hours as needed., Disp: 120 tablet, Rfl: 5 .  aspirin EC 81 MG tablet, Take 81 mg by mouth daily., Disp: , Rfl:  .  Dietary Management Product (FOSTEUM PLUS) CAPS, Take 1 capsule by mouth 2 (two) times daily., Disp: 60 capsule, Rfl: 11 .  esomeprazole (NEXIUM) 40 MG capsule, TAKE 1 CAPSULE (40 MG TOTAL) BY MOUTH DAILY., Disp: 90 capsule, Rfl: 1 .  fluticasone furoate-vilanterol (BREO ELLIPTA) 100-25 MCG/INH AEPB, Inhale 1 puff into the lungs daily., Disp: 90 each, Rfl: 1 .  HYDROcodone-acetaminophen (NORCO) 10-325 MG tablet, Take 1 tablet by mouth every 6 (six) hours as needed (for pain)., Disp: 90 tablet, Rfl: 0 .  hydrocortisone 2.5 % cream, Apply 1 application topically daily as needed (itching). , Disp: , Rfl:  .  ibuprofen (ADVIL,MOTRIN) 800 MG tablet, TAKE 1 TABLET BY MOUTH TWICE A DAY WITH FOOD AS NEEDED FOR PAIN, Disp: 180 tablet, Rfl: 1

## 2017-08-08 NOTE — Telephone Encounter (Signed)
Pt is calling back about her refill [phone]

## 2017-08-09 ENCOUNTER — Telehealth: Payer: Self-pay | Admitting: Internal Medicine

## 2017-08-09 DIAGNOSIS — R002 Palpitations: Secondary | ICD-10-CM

## 2017-08-09 MED ORDER — BENZONATATE 200 MG PO CAPS: 200.0000 mg | ORAL_CAPSULE | Freq: Three times a day (TID) | ORAL | 2 refills | Status: DC | PRN

## 2017-08-09 NOTE — Telephone Encounter (Signed)
Ok to refill 

## 2017-08-09 NOTE — Telephone Encounter (Signed)
Can you find if we ever ordered cardiology referral???  Ok to refer to Cardiology - Dr Stanford Breed-  Long smoking history, palpitations

## 2017-08-09 NOTE — Telephone Encounter (Signed)
Thank you !!- We will need to decline the cough syrup. Please to explain to her that it is because she also has the hydrocodone tabs.   Recommend Delsym otc.     We can give benzonatate 200 mg, # 40, 1 every 8 hours, ref x 2 for cough if she wants.

## 2017-08-09 NOTE — Telephone Encounter (Signed)
Pt is seeing Dr. Stanford Breed on 08/15/17, but per pt the cardiologist is requesting that our office place a referral to the office.    CY ok to place order?  Thanks!

## 2017-08-09 NOTE — Telephone Encounter (Signed)
Spoke with pt, aware of recs.  Tessalon sent to preferred pharmacy.  Nothing further needed.

## 2017-08-09 NOTE — Telephone Encounter (Signed)
Routing to dr jones, please advise, thanks 

## 2017-08-09 NOTE — Telephone Encounter (Signed)
Checking to see if office has received PA form, please advise.

## 2017-08-09 NOTE — Telephone Encounter (Signed)
Patient called [facility] stating that Dr. Ronnald Ramp needs to authorize the pharmacy to fill Norco.

## 2017-08-09 NOTE — Telephone Encounter (Signed)
Pt has not been referred to cardiology by our office.   Order placed.  Nothing further needed.

## 2017-08-09 NOTE — Telephone Encounter (Signed)
Spoke with patient. She stated that she only needed a referral to Dr. Jacalyn Lefevre office, not a pre authorization on [place] patient that the order was placed earlier today. She verbalized understanding. '  Nothing else needed at time of call.

## 2017-08-09 NOTE — Telephone Encounter (Signed)
Dr Annamaria Boots- before I print this I wanted to just bring to your attention that she just had # 90 hydrocodone pills prescribed by PCP on 08/07/17. Just want to make sure you are still okay with this refill, thanks!  Allergies  Allergen Reactions  . No Known Allergies    Current Outpatient Medications on File Prior to Visit  Medication Sig Dispense Refill  . albuterol (PROAIR HFA) 108 (90 Base) MCG/ACT inhaler Inhale 1-2 puffs into the lungs every 4 (four) hours as needed for wheezing or shortness of breath. 8.5 Inhaler 12  . albuterol (PROVENTIL) (2.5 MG/3ML) 0.083% nebulizer solution Take 3 mLs (2.5 mg total) by nebulization every 6 (six) hours. Dx COPD with bronchitis 120 mL 5  . ALPRAZolam (XANAX) 1 MG tablet Take 1 tablet (1 mg total) by mouth every 6 (six) hours as needed. 120 tablet 5  . aspirin EC 81 MG tablet Take 81 mg by mouth daily.    . Dietary Management Product (FOSTEUM PLUS) CAPS Take 1 capsule by mouth 2 (two) times daily. 60 capsule 11  . esomeprazole (NEXIUM) 40 MG capsule TAKE 1 CAPSULE (40 MG TOTAL) BY MOUTH DAILY. 90 capsule 1  . fluticasone furoate-vilanterol (BREO ELLIPTA) 100-25 MCG/INH AEPB Inhale 1 puff into the lungs daily. 90 each 1  . HYDROcodone-acetaminophen (NORCO) 10-325 MG tablet Take 1 tablet by mouth every 6 (six) hours as needed (for pain). 90 tablet 0  . hydrocortisone 2.5 % cream Apply 1 application topically daily as needed (itching).     Marland Kitchen ibuprofen (ADVIL,MOTRIN) 800 MG tablet TAKE 1 TABLET BY MOUTH TWICE A DAY WITH FOOD AS NEEDED FOR PAIN 180 tablet 1  . [DISCONTINUED] estradiol (VIVELLE-DOT) 0.0375 MG/24HR Place 1 patch onto the skin 2 (two) times a week.      . [DISCONTINUED] ipratropium (ATROVENT) 0.02 % nebulizer solution Take 2.5 mLs (500 mcg total) by nebulization 4 (four) times daily. DX:  496 300 mL 5  . [DISCONTINUED] progesterone (PROMETRIUM) 100 MG capsule Take 100 mg by mouth daily.       No current facility-administered medications on file  prior to visit.

## 2017-08-10 NOTE — Telephone Encounter (Signed)
Pt is requesting a PA. There is another note about this. Please disregard.

## 2017-08-10 NOTE — Telephone Encounter (Signed)
Key: [id number]

## 2017-08-11 NOTE — Telephone Encounter (Signed)
Spoke to pt to let her know

## 2017-08-11 NOTE — Telephone Encounter (Signed)
PA is approved. Will you inform pt of same.

## 2017-08-15 ENCOUNTER — Encounter: Payer: Self-pay | Admitting: Cardiology

## 2017-08-15 ENCOUNTER — Ambulatory Visit (INDEPENDENT_AMBULATORY_CARE_PROVIDER_SITE_OTHER): Payer: Medicare Other | Admitting: Cardiology

## 2017-08-15 VITALS — BP 125/80 | HR 88 | Ht 66.0 in | Wt 202.4 lb

## 2017-08-15 DIAGNOSIS — R002 Palpitations: Secondary | ICD-10-CM

## 2017-08-15 DIAGNOSIS — E7800 Pure hypercholesterolemia, unspecified: Secondary | ICD-10-CM

## 2017-08-15 NOTE — Progress Notes (Signed)
Referring-Clinton Lucia Estelle MD Reason for referral-Palpitations  HPI: 62 year old female for evaluation of palpitations at request of Deneise Lever MD.  Echocardiogram October 2017 showed normal LV systolic function and grade 1 diastolic dysfunction.  Nuclear study October 2017 showed ejection fraction 56%, probable shifting breast attenuation, cannot rule out mild apical ischemia.  Patient had previous resection of thymoma.  Chest CT December 2018 showed no residual mass.  There was note of aortic atherosclerosis.  Most recent cholesterol panel December 2018 showed total of 182, HDL 35, LDL 118.  Potassium 3.9, TSH 3.07.  Since last seen over the last 2 months patient has noticed palpitations.  They are described as both a skip and sustained racing.  They can last hours at a time.  She otherwise does not have dyspnea, exertional chest pain or syncope.  Current Outpatient Medications  Medication Sig Dispense Refill  . albuterol (PROAIR HFA) 108 (90 Base) MCG/ACT inhaler Inhale 1-2 puffs into the lungs every 4 (four) hours as needed for wheezing or shortness of breath. 8.5 Inhaler 12  . albuterol (PROVENTIL) (2.5 MG/3ML) 0.083% nebulizer solution Take 3 mLs (2.5 mg total) by nebulization every 6 (six) hours. Dx COPD with bronchitis 120 mL 5  . ALPRAZolam (XANAX) 1 MG tablet Take 1 tablet (1 mg total) by mouth every 6 (six) hours as needed. 120 tablet 5  . aspirin EC 81 MG tablet Take 81 mg by mouth daily.    . benzonatate (TESSALON) 200 MG capsule Take 1 capsule (200 mg total) by mouth 3 (three) times daily as needed for cough. 40 capsule 2  . esomeprazole (NEXIUM) 40 MG capsule TAKE 1 CAPSULE (40 MG TOTAL) BY MOUTH DAILY. 90 capsule 1  . HYDROcodone-acetaminophen (NORCO) 10-325 MG tablet Take 1 tablet by mouth every 6 (six) hours as needed (for pain). 90 tablet 0  . hydrocortisone 2.5 % cream Apply 1 application topically daily as needed (itching).     Marland Kitchen ibuprofen (ADVIL,MOTRIN) 800 MG tablet  TAKE 1 TABLET BY MOUTH TWICE A DAY WITH FOOD AS NEEDED FOR PAIN 180 tablet 1   No current facility-administered medications for this visit.     Allergies  Allergen Reactions  . No Known Allergies      Past Medical History:  Diagnosis Date  . Arthritis    neck  . Asthma   . Chronic back pain   . Chronic bronchitis ([place])   . Chronic neck pain   . COPD (chronic obstructive pulmonary disease) ([facility])    Advair and Albuterol as needed as well as neb  . GERD (gastroesophageal reflux disease)    takes Nexium daily  . History of blood transfusion    in the 70's. No abnormal reaction noted  . History of colon polyps    benign  . Hyperlipidemia    not on any meds bc refused to pick up from pharmacy  . Joint pain   . Joint swelling   . Mediastinal tumor   . Nocturia   . OCD (obsessive compulsive disorder)   . Panic disorder    takes Xanax daily as needed  . Pneumonia    2016  . PONV (postoperative nausea and vomiting)   . Seizures ([place])    in the 80's    Past Surgical History:  Procedure Laterality Date  . COLONOSCOPY    . ECTOPIC PREGNANCY SURGERY     x 2   . ESOPHAGOGASTRODUODENOSCOPY    . LAPAROSCOPIC LYSIS INTESTINAL ADHESIONS  x 2  . RESECTION OF MEDIASTINAL MASS N/A 06/10/2016   Procedure: RESECTION OF MEDIASTINAL MASS;  Surgeon: Grace Isaac, MD;  Location: [facility];  Service: Thoracic;  Laterality: N/A;  . VIDEO ASSISTED THORACOSCOPY Right 06/10/2016   Procedure: VIDEO ASSISTED THORACOSCOPY WITH PLACEMENT OF ON Q TUNNELER;  Surgeon: Grace Isaac, MD;  Location: [facility];  Service: Thoracic;  Laterality: Right;  Marland Kitchen VIDEO BRONCHOSCOPY N/A 06/10/2016   Procedure: VIDEO BRONCHOSCOPY;  Surgeon: Grace Isaac, MD;  Location: [facility] OR;  Service: Thoracic;  Laterality: N/A;    Social History   Socioeconomic History  . Marital status: Divorced    Spouse name: Not on file  . Number of children: 1  . Years of education: Not on file  . Highest education level:  Not on file  Social Needs  . Financial resource strain: Not on file  . Food insecurity - worry: Not on file  . Food insecurity - inability: Not on file  . Transportation needs - medical: Not on file  . Transportation needs - non-medical: Not on file  Occupational History  . Occupation: Financial controller at [employer]  . Smoking status: Former Smoker    Packs/day: 1.00    Years: 20.00    Pack years: 20.00    Types: Cigarettes    Last attempt to quit: 05/18/2002    Years since quitting: 15.2  . Smokeless tobacco: Never Used  . Tobacco comment: quit smoking 82yrs ago  Substance and Sexual Activity  . Alcohol use: No    Alcohol/week: 0.0 oz  . Drug use: No  . Sexual activity: Not on file  Other Topics Concern  . Not on file  Social History Narrative  . Not on file    Family History  Problem Relation Age of Onset  . Heart attack Father   . Diabetes Mother   . Clotting disorder Mother        PE  . Breast cancer Sister   . Other Sister        colon mass-benign  . Esophageal cancer Neg Hx   . Colon cancer Neg Hx   . Rectal cancer Neg Hx   . Stomach cancer Neg Hx     ROS: Some residual chest pain from prior resection of thymoma but no fevers or chills, productive cough, hemoptysis, dysphasia, odynophagia, melena, hematochezia, dysuria, hematuria, rash, seizure activity, orthopnea, PND, pedal edema, claudication. Remaining systems are negative.  Physical Exam:   Blood pressure 125/80, pulse 88, height 5\' 6"  (1.676 m), weight 202 lb 6.4 oz (91.8 kg), SpO2 99 %.  General:  Well developed/obese in NAD Skin warm/dry Patient not depressed No peripheral clubbing Back-normal HEENT-normal/normal eyelids Neck supple/normal carotid upstroke bilaterally; no bruits; no JVD; no thyromegaly chest - CTA/ normal expansion CV - RRR/normal S1 and S2; no murmurs, rubs or gallops;  PMI nondisplaced Abdomen -NT/ND, no HSM, no mass, + bowel sounds, no bruit 2+ femoral pulses, no  bruits Ext-no edema, chords, 2+ DP Neuro-grossly nonfocal  ECG -May 18, 2017-sinus tachycardia.  Personally reviewed  A/P  1 palpitations-etiology unclear.  Recent TSH and potassium normal.  We will plan to repeat echocardiogram to reassess LV function.  Schedule 30-day event monitor to more fully assess.  May need beta-blocker in the future.  2 hyperlipidemia-managed by primary care.  3 obesity-needs weight loss  Kirk Ruths, MD

## 2017-08-15 NOTE — Patient Instructions (Addendum)
Medication Instructions:   NO CHANGE  Testing/Procedures:  Your physician has recommended that you wear an event monitor. Event monitors are medical devices that record the heart's electrical activity. Doctors most often [place] these monitors to diagnose arrhythmias. Arrhythmias are problems with the speed or rhythm of the heartbeat. The monitor is a small, portable device. You can wear one while you do your normal daily activities. This is usually used to diagnose what is causing palpitations/syncope (passing out).   Your physician has requested that you have an echocardiogram. Echocardiography is a painless test that uses sound waves to create images of your heart. It provides your doctor with information about the size and shape of your heart and how well your heart's chambers and valves are working. This procedure takes approximately one hour. There are no restrictions for this procedure.    Follow-Up:  Your physician recommends that you schedule a follow-up appointment in: [place]

## 2017-08-18 ENCOUNTER — Ambulatory Visit (INDEPENDENT_AMBULATORY_CARE_PROVIDER_SITE_OTHER): Payer: Medicare Other

## 2017-08-18 DIAGNOSIS — R002 Palpitations: Secondary | ICD-10-CM | POA: Diagnosis not present

## 2017-08-22 ENCOUNTER — Ambulatory Visit (HOSPITAL_COMMUNITY): Payer: Medicare Other | Attending: Cardiovascular Disease

## 2017-08-22 ENCOUNTER — Other Ambulatory Visit: Payer: Self-pay

## 2017-08-22 DIAGNOSIS — R569 Unspecified convulsions: Secondary | ICD-10-CM | POA: Diagnosis not present

## 2017-08-22 DIAGNOSIS — R002 Palpitations: Secondary | ICD-10-CM | POA: Diagnosis not present

## 2017-08-22 DIAGNOSIS — E785 Hyperlipidemia, unspecified: Secondary | ICD-10-CM | POA: Insufficient documentation

## 2017-08-22 DIAGNOSIS — I080 Rheumatic disorders of both mitral and aortic valves: Secondary | ICD-10-CM | POA: Insufficient documentation

## 2017-08-22 DIAGNOSIS — J440 Chronic obstructive pulmonary disease with acute lower respiratory infection: Secondary | ICD-10-CM | POA: Insufficient documentation

## 2017-08-28 DIAGNOSIS — J449 Chronic obstructive pulmonary disease, unspecified: Secondary | ICD-10-CM | POA: Diagnosis not present

## 2017-08-31 ENCOUNTER — Telehealth: Payer: Self-pay | Admitting: Internal Medicine

## 2017-08-31 MED ORDER — DOXYCYCLINE HYCLATE 100 MG PO TABS: 100.0000 mg | ORAL_TABLET | Freq: Two times a day (BID) | ORAL | 0 refills | Status: DC

## 2017-08-31 NOTE — Telephone Encounter (Signed)
Medication sent in. Patient aware.   CY FYI patient did not get flu shot

## 2017-08-31 NOTE — Telephone Encounter (Signed)
Called and spoke with patient, sx consist of runny nose, productive cough, fever at nights, sob. Denies body aches, chest pain. Patient was around someone with flu.   CY please advise    Current Outpatient Medications on File Prior to Visit  Medication Sig Dispense Refill  . albuterol (PROAIR HFA) 108 (90 Base) MCG/ACT inhaler Inhale 1-2 puffs into the lungs every 4 (four) hours as needed for wheezing or shortness of breath. 8.5 Inhaler 12  . albuterol (PROVENTIL) (2.5 MG/3ML) 0.083% nebulizer solution Take 3 mLs (2.5 mg total) by nebulization every 6 (six) hours. Dx COPD with bronchitis 120 mL 5  . ALPRAZolam (XANAX) 1 MG tablet Take 1 tablet (1 mg total) by mouth every 6 (six) hours as needed. 120 tablet 5  . aspirin EC 81 MG tablet Take 81 mg by mouth daily.    . benzonatate (TESSALON) 200 MG capsule Take 1 capsule (200 mg total) by mouth 3 (three) times daily as needed for cough. 40 capsule 2  . esomeprazole (NEXIUM) 40 MG capsule TAKE 1 CAPSULE (40 MG TOTAL) BY MOUTH DAILY. 90 capsule 1  . HYDROcodone-acetaminophen (NORCO) 10-325 MG tablet Take 1 tablet by mouth every 6 (six) hours as needed (for pain). 90 tablet 0  . hydrocortisone 2.5 % cream Apply 1 application topically daily as needed (itching).     Marland Kitchen ibuprofen (ADVIL,MOTRIN) 800 MG tablet TAKE 1 TABLET BY MOUTH TWICE A DAY WITH FOOD AS NEEDED FOR PAIN 180 tablet 1  . [DISCONTINUED] estradiol (VIVELLE-DOT) 0.0375 MG/24HR Place 1 patch onto the skin 2 (two) times a week.      . [DISCONTINUED] ipratropium (ATROVENT) 0.02 % nebulizer solution Take 2.5 mLs (500 mcg total) by nebulization 4 (four) times daily. DX:  496 300 mL 5  . [DISCONTINUED] progesterone (PROMETRIUM) 100 MG capsule Take 100 mg by mouth daily.       No current facility-administered medications on file prior to visit.     Allergies  Allergen Reactions  . No Known Allergies

## 2017-08-31 NOTE — Telephone Encounter (Signed)
Did she ever get flu shot this year ?  Offer doxycyclijne 100 mg, # 14, 1 twice daily  Mucinex-DM/ may help

## 2017-09-05 ENCOUNTER — Telehealth: Payer: Self-pay | Admitting: Internal Medicine

## 2017-09-05 NOTE — Telephone Encounter (Signed)
Refill of Norco  LOV 03/13/17  with  Dr. Ronnald Ramp  [facility] 08/07/17   #90   0 refills   [id number] [address], [place]. [phone] (Phone)

## 2017-09-05 NOTE — Telephone Encounter (Signed)
Copied from [place] [phone] [id number]. Topic: Quick Communication - Rx Refill/Question >> Sep 05, 2017  9:05 AM Arletha Grippe wrote: Medication:  HYDROcodone-acetaminophen (NORCO) 10-325 MG tablet     Has the patient contacted their pharmacy? No.   (Agent: If no, request that the patient contact the pharmacy for the refill.)   [facility] (with phone [facility] [address]    Agent: Please be advised that RX refills may take up to 3 business days. We ask that you follow-up with your pharmacy.

## 2017-09-06 ENCOUNTER — Other Ambulatory Visit: Payer: Self-pay | Admitting: Internal Medicine

## 2017-09-06 DIAGNOSIS — M159 Polyosteoarthritis, unspecified: Secondary | ICD-10-CM

## 2017-09-06 DIAGNOSIS — M150 Primary generalized (osteo)arthritis: Secondary | ICD-10-CM

## 2017-09-06 DIAGNOSIS — M1711 Unilateral primary osteoarthritis, right knee: Secondary | ICD-10-CM

## 2017-09-06 DIAGNOSIS — M5030 Other cervical disc degeneration, unspecified cervical region: Secondary | ICD-10-CM

## 2017-09-06 MED ORDER — HYDROCODONE-ACETAMINOPHEN 10-325 MG PO TABS: 1.0000 | ORAL_TABLET | Freq: Four times a day (QID) | ORAL | 0 refills | Status: DC | PRN

## 2017-09-06 NOTE — Telephone Encounter (Signed)
Notified pt rx has been sent to your local pharmacy.../lmb  

## 2017-09-14 DIAGNOSIS — L239 Allergic contact dermatitis, unspecified cause: Secondary | ICD-10-CM | POA: Diagnosis not present

## 2017-09-21 ENCOUNTER — Ambulatory Visit: Payer: Medicare Other | Admitting: Internal Medicine

## 2017-09-22 DIAGNOSIS — J449 Chronic obstructive pulmonary disease, unspecified: Secondary | ICD-10-CM | POA: Diagnosis not present

## 2017-09-28 ENCOUNTER — Ambulatory Visit: Payer: Medicare Other | Admitting: Internal Medicine

## 2017-09-29 ENCOUNTER — Ambulatory Visit (INDEPENDENT_AMBULATORY_CARE_PROVIDER_SITE_OTHER): Payer: Medicare Other | Admitting: Internal Medicine

## 2017-09-29 ENCOUNTER — Encounter: Payer: Self-pay | Admitting: Internal Medicine

## 2017-09-29 DIAGNOSIS — F410 Panic disorder [episodic paroxysmal anxiety] without agoraphobia: Secondary | ICD-10-CM | POA: Diagnosis not present

## 2017-09-29 DIAGNOSIS — J4520 Mild intermittent asthma, uncomplicated: Secondary | ICD-10-CM | POA: Diagnosis not present

## 2017-09-29 MED ORDER — PROMETHAZINE-CODEINE 6.25-10 MG/5ML PO SYRP: 5.0000 mL | ORAL_SOLUTION | ORAL | 0 refills | Status: DC | PRN

## 2017-09-29 MED ORDER — ALPRAZOLAM 1 MG PO TABS: 1.0000 mg | ORAL_TABLET | Freq: Four times a day (QID) | ORAL | 5 refills | Status: DC | PRN

## 2017-09-29 NOTE — Assessment & Plan Note (Signed)
Watching use of acute bronchodilator therapies and need for maintenance controller.

## 2017-09-29 NOTE — Patient Instructions (Signed)
Scripts refilled - use sparingly  If you need to use your rescue inhaler routinely every day, we will talk again about other kinds of inhaler.
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
## 2017-09-29 NOTE — Assessment & Plan Note (Signed)
We assume she is dependent on Xanax but his helped her avoid had episodes of anxiety which overlap with her asthma symptoms need to heavy use of bronchodilators.

## 2017-10-02 ENCOUNTER — Telehealth: Payer: Self-pay | Admitting: Internal Medicine

## 2017-10-02 ENCOUNTER — Other Ambulatory Visit: Payer: Self-pay | Admitting: Internal Medicine

## 2017-10-02 NOTE — Telephone Encounter (Signed)
Routing to dr jones, please advise, thanks 

## 2017-10-02 NOTE — Telephone Encounter (Signed)
Copied from [place]. Topic: Quick Communication - Rx Refill/Question >> Oct 02, 2017 10:18 AM Margot Ables wrote: Medication: hydrocodone - pt has enough medicine until 13th or 14th  Has the patient contacted their pharmacy? No. controlled Preferred Pharmacy (with phone number or street name): [id number] [address], [place]. [phone] (Phone) [phone] (Fax)   Agent: Please be advised that RX refills may take up to 3 business days. We ask that you follow-up with your pharmacy.

## 2017-10-04 ENCOUNTER — Other Ambulatory Visit: Payer: Self-pay | Admitting: Internal Medicine

## 2017-10-04 DIAGNOSIS — M150 Primary generalized (osteo)arthritis: Secondary | ICD-10-CM

## 2017-10-04 DIAGNOSIS — M159 Polyosteoarthritis, unspecified: Secondary | ICD-10-CM

## 2017-10-04 DIAGNOSIS — M5030 Other cervical disc degeneration, unspecified cervical region: Secondary | ICD-10-CM

## 2017-10-04 DIAGNOSIS — M1711 Unilateral primary osteoarthritis, right knee: Secondary | ICD-10-CM

## 2017-10-04 MED ORDER — HYDROCODONE-ACETAMINOPHEN 10-325 MG PO TABS: 1.0000 | ORAL_TABLET | Freq: Four times a day (QID) | ORAL | 0 refills | Status: DC | PRN

## 2017-10-09 ENCOUNTER — Telehealth: Payer: Self-pay

## 2017-10-09 MED ORDER — ESOMEPRAZOLE MAGNESIUM 40 MG PO CPDR: 40.0000 mg | DELAYED_RELEASE_CAPSULE | Freq: Every day | ORAL | 1 refills | Status: DC

## 2017-10-09 NOTE — Telephone Encounter (Signed)
[facility] 40 mg capsule. erx sent as requested.

## 2017-10-13 ENCOUNTER — Telehealth: Payer: Self-pay

## 2017-10-13 ENCOUNTER — Telehealth: Payer: Self-pay | Admitting: Internal Medicine

## 2017-10-13 NOTE — Telephone Encounter (Signed)
Copied from [place] [id number]. Topic: Quick Communication - See Telephone Encounter >> Oct 13, 2017 11:53 AM Conception Chancy, NT wrote: CRM for notification. See Telephone encounter for: 10/13/17.  [facility] is calling and is needing prior authorization and they also have some questions to ask. Is requesting Dr. Ronnald Ramp nurse to contact them.  CB# [phone]

## 2017-10-13 NOTE — Telephone Encounter (Signed)
Key: [id number]

## 2017-10-13 NOTE — Telephone Encounter (Signed)
Please have this note deleted. This is a duplicate and there is already a note in the chart about this.

## 2017-10-16 NOTE — Telephone Encounter (Signed)
PA has been approved

## 2017-10-17 DIAGNOSIS — J449 Chronic obstructive pulmonary disease, unspecified: Secondary | ICD-10-CM | POA: Diagnosis not present

## 2017-10-23 ENCOUNTER — Other Ambulatory Visit: Payer: Self-pay | Admitting: Internal Medicine

## 2017-11-01 ENCOUNTER — Telehealth: Payer: Self-pay | Admitting: Internal Medicine

## 2017-11-01 DIAGNOSIS — J320 Chronic maxillary sinusitis: Secondary | ICD-10-CM | POA: Diagnosis not present

## 2017-11-01 DIAGNOSIS — J322 Chronic ethmoidal sinusitis: Secondary | ICD-10-CM | POA: Diagnosis not present

## 2017-11-01 DIAGNOSIS — J410 Simple chronic bronchitis: Secondary | ICD-10-CM | POA: Diagnosis not present

## 2017-11-01 DIAGNOSIS — K210 Gastro-esophageal reflux disease with esophagitis: Secondary | ICD-10-CM | POA: Diagnosis not present

## 2017-11-01 DIAGNOSIS — J040 Acute laryngitis: Secondary | ICD-10-CM | POA: Diagnosis not present

## 2017-11-01 NOTE — Telephone Encounter (Signed)
Appt for 11/24/2017. Do you want to see pt sooner in order to refill Norco?

## 2017-11-01 NOTE — Telephone Encounter (Signed)
Norco 10-325 mg tablet refill request  LOV 07/06/17 with Dr. Ronnald Ramp    [facility] [address] [place].

## 2017-11-01 NOTE — Telephone Encounter (Signed)
Copied from [place] [id number]. Topic: Quick Communication - Rx Refill/Question >> Nov 01, 2017 12:50 PM Cleaster Corin, NT wrote: Medication: HYDROcodone-acetaminophen ([place]) 10-325 MG tablet [[id number]]  Has the patient contacted their pharmacy? no (Agent: If no, request that the patient contact the pharmacy for the refill.) Preferred Pharmacy (with phone number or street name):[id number] [address], [place]. [place] [address] Phone: [phone] Fax: [phone]   Agent: Please be advised that RX refills may take up to 3 business days. We ask that you follow-up with your pharmacy.

## 2017-11-03 NOTE — Telephone Encounter (Signed)
Pt called to check on status of hydrocodone refill. Advised pt we are waiting on reply from Dr. Ronnald Ramp. Appt is scheduled for 11/29/17 11:30. Pt said to please let her know if she needs to be seen sooner. She had filled last 10/04/17 and has 6 left and takes 3/day.  [id number] [address], [place] [address] Phone: [phone] Fax: [phone]

## 2017-11-06 ENCOUNTER — Other Ambulatory Visit: Payer: Self-pay | Admitting: Internal Medicine

## 2017-11-06 DIAGNOSIS — M150 Primary generalized (osteo)arthritis: Secondary | ICD-10-CM

## 2017-11-06 DIAGNOSIS — M5030 Other cervical disc degeneration, unspecified cervical region: Secondary | ICD-10-CM

## 2017-11-06 DIAGNOSIS — M1711 Unilateral primary osteoarthritis, right knee: Secondary | ICD-10-CM

## 2017-11-06 DIAGNOSIS — M159 Polyosteoarthritis, unspecified: Secondary | ICD-10-CM

## 2017-11-06 MED ORDER — HYDROCODONE-ACETAMINOPHEN 10-325 MG PO TABS: 1.0000 | ORAL_TABLET | Freq: Four times a day (QID) | ORAL | 0 refills | Status: DC | PRN

## 2017-11-06 NOTE — Telephone Encounter (Signed)
Pt following up on refill request for  HYDROcodone-acetaminophen (NORCO) 10-325 MG tablet  Pt states she is out and has dental surgery today. Pt aware she cannot get from any other provider, so hopes Rx can be called in asap.please.  [id number] [address], [place]. [phone] (Phone) [phone] (Fax)

## 2017-11-06 NOTE — Telephone Encounter (Signed)
Rx has been sent to the pharmacy

## 2017-11-06 NOTE — Telephone Encounter (Signed)
Patient has been informed.

## 2017-11-06 NOTE — Telephone Encounter (Signed)
Patient is calling again about this. Please advise. Thank you.

## 2017-11-08 DIAGNOSIS — K210 Gastro-esophageal reflux disease with esophagitis: Secondary | ICD-10-CM | POA: Diagnosis not present

## 2017-11-08 DIAGNOSIS — J410 Simple chronic bronchitis: Secondary | ICD-10-CM | POA: Diagnosis not present

## 2017-11-08 DIAGNOSIS — J320 Chronic maxillary sinusitis: Secondary | ICD-10-CM | POA: Diagnosis not present

## 2017-11-09 DIAGNOSIS — J449 Chronic obstructive pulmonary disease, unspecified: Secondary | ICD-10-CM | POA: Diagnosis not present

## 2017-11-20 ENCOUNTER — Ambulatory Visit: Payer: Medicare Other | Admitting: Cardiology

## 2017-11-22 DIAGNOSIS — Z01419 Encounter for gynecological examination (general) (routine) without abnormal findings: Secondary | ICD-10-CM | POA: Diagnosis not present

## 2017-11-22 LAB — HM PAP SMEAR

## 2017-11-24 ENCOUNTER — Other Ambulatory Visit: Payer: Self-pay | Admitting: Internal Medicine

## 2017-11-24 DIAGNOSIS — M150 Primary generalized (osteo)arthritis: Principal | ICD-10-CM

## 2017-11-24 DIAGNOSIS — M5030 Other cervical disc degeneration, unspecified cervical region: Secondary | ICD-10-CM

## 2017-11-24 DIAGNOSIS — M159 Polyosteoarthritis, unspecified: Secondary | ICD-10-CM

## 2017-11-29 ENCOUNTER — Other Ambulatory Visit (INDEPENDENT_AMBULATORY_CARE_PROVIDER_SITE_OTHER): Payer: Medicare Other

## 2017-11-29 ENCOUNTER — Encounter: Payer: Self-pay | Admitting: Internal Medicine

## 2017-11-29 ENCOUNTER — Ambulatory Visit (INDEPENDENT_AMBULATORY_CARE_PROVIDER_SITE_OTHER): Payer: Medicare Other | Admitting: Internal Medicine

## 2017-11-29 VITALS — BP 130/86 | HR 80 | Temp 98.2°F | Ht 66.0 in | Wt 215.0 lb

## 2017-11-29 DIAGNOSIS — M159 Polyosteoarthritis, unspecified: Secondary | ICD-10-CM

## 2017-11-29 DIAGNOSIS — M1711 Unilateral primary osteoarthritis, right knee: Secondary | ICD-10-CM

## 2017-11-29 DIAGNOSIS — M5030 Other cervical disc degeneration, unspecified cervical region: Secondary | ICD-10-CM

## 2017-11-29 DIAGNOSIS — M150 Primary generalized (osteo)arthritis: Secondary | ICD-10-CM | POA: Diagnosis not present

## 2017-11-29 DIAGNOSIS — L309 Dermatitis, unspecified: Secondary | ICD-10-CM | POA: Insufficient documentation

## 2017-11-29 DIAGNOSIS — I10 Essential (primary) hypertension: Secondary | ICD-10-CM

## 2017-11-29 DIAGNOSIS — R7303 Prediabetes: Secondary | ICD-10-CM

## 2017-11-29 HISTORY — DX: Dermatitis, unspecified: L30.9

## 2017-11-29 LAB — HEMOGLOBIN A1C: Hgb A1c MFr Bld: 6.4 % (ref 4.6–6.5)

## 2017-11-29 LAB — BASIC METABOLIC PANEL
BUN: 13 mg/dL (ref 6–23)
CO2: 31 mEq/L (ref 19–32)
Calcium: 9 mg/dL (ref 8.4–10.5)
Chloride: 101 mEq/L (ref 96–112)
Creatinine, Ser: 0.72 mg/dL (ref 0.40–1.20)
GFR: 87.49 mL/min (ref >60.00)
Glucose, Bld: 170 mg/dL — ABNORMAL HIGH (ref 70–99)
POTASSIUM: 3.5 meq/L (ref 3.5–5.1)
SODIUM: 138 meq/L (ref 135–145)

## 2017-11-29 MED ORDER — HYDROCODONE-ACETAMINOPHEN 10-325 MG PO TABS: 1.0000 | ORAL_TABLET | ORAL | 0 refills | Status: DC | PRN

## 2017-11-29 MED ORDER — CLOBETASOL PROP EMOLLIENT BASE 0.05 % EX CREA: 1.0000 "application " | TOPICAL_CREAM | Freq: Two times a day (BID) | CUTANEOUS | 1 refills | Status: DC

## 2017-11-29 NOTE — Patient Instructions (Signed)

## 2017-11-29 NOTE — Progress Notes (Signed)
Subjective:  Patient ID: Felicia Odom, female    DOB: [birth year]  Age: 62 y.o. MRN: [id number]  CC: Hypertension; Osteoarthritis; and Rash   HPI Felicia Odom presents for f/up - She complains of a chronic, recurrent itchy rash on both upper extremities.  She has been trying to control it with over-the-counter hydrocortisone 2.5% without much symptom relief.    She also complains of worsening large joint pain.  She tells me that 3 doses a day of hydrocodone are not controlling her symptoms and she wants to increase to 4 times a day.  She tells me her blood pressure has been well controlled.  She has had no recent episodes of chest pain, shortness of breath, or DOE.   Outpatient Medications Prior to Visit  Medication Sig Dispense Refill  . albuterol (PROVENTIL) (2.5 MG/3ML) 0.083% nebulizer solution Take 3 mLs (2.5 mg total) by nebulization every 6 (six) hours. Dx COPD with bronchitis 120 mL 5  . ALPRAZolam (XANAX) 1 MG tablet Take 1 tablet (1 mg total) by mouth every 6 (six) hours as needed. 120 tablet 5  . aspirin EC 81 MG tablet Take 81 mg by mouth daily.    Marland Kitchen esomeprazole (NEXIUM) 40 MG capsule Take 1 capsule (40 mg total) by mouth daily. 90 capsule 1  . ibuprofen (ADVIL,MOTRIN) 800 MG tablet TAKE 1 TABLET BY MOUTH TWICE A DAY WITH FOOD AS NEEDED FOR PAIN 180 tablet 1  . PROAIR HFA 108 (90 Base) MCG/ACT inhaler INHALE 1 TO 2 PUFFS INTO THE LUNGS EVERY 4 HOURS AS NEEDED FOR WHEEZING OR SHORTNESS OF BREATH 8.5 Inhaler 6  . HYDROcodone-acetaminophen (NORCO) 10-325 MG tablet Take 1 tablet by mouth every 6 (six) hours as needed (for pain). 90 tablet 0  . hydrocortisone 2.5 % cream Apply 1 application topically daily as needed (itching).      No facility-administered medications prior to visit.     ROS Review of Systems  Constitutional: Positive for activity change and unexpected weight change (wt gain). Negative for diaphoresis and fatigue.       She complains that she  has been in too much pain recently to be very active  HENT: Negative.   Eyes: Negative for visual disturbance.  Respiratory: Negative for cough, chest tightness, shortness of breath and wheezing.   Cardiovascular: Negative for chest pain, palpitations and leg swelling.  Gastrointestinal: Negative for abdominal pain, constipation, diarrhea, nausea and vomiting.  Endocrine: Negative.   Genitourinary: Negative.  Negative for difficulty urinating.  Musculoskeletal: Positive for arthralgias and neck pain. Negative for myalgias.  Skin: Positive for rash.  Allergic/Immunologic: Negative.   Neurological: Negative.  Negative for dizziness, weakness and light-headedness.  Hematological: Negative for adenopathy. Does not bruise/bleed easily.  Psychiatric/Behavioral: Negative for behavioral problems, decreased concentration, dysphoric mood, self-injury, sleep disturbance and suicidal ideas. The patient is nervous/anxious.     Objective:  BP 130/86 (BP Location: Left Arm, Patient Position: Sitting, Cuff Size: Large)   Pulse 80   Temp 98.2 F (36.8 C) (Oral)   Ht 5\' 6"  (1.676 m)   Wt 215 lb (97.5 kg)   SpO2 97%   BMI 34.70 kg/m   BP Readings from Last 3 Encounters:  11/29/17 130/86  09/29/17 (!) 146/98  08/15/17 125/80    Wt Readings from Last 3 Encounters:  11/29/17 215 lb (97.5 kg)  09/29/17 206 lb 9.6 oz (93.7 kg)  08/15/17 202 lb 6.4 oz (91.8 kg)    Physical Exam  Constitutional: She  is oriented to person, place, and time. No distress.  HENT:  Mouth/Throat: Oropharynx is clear and moist. No oropharyngeal exudate.  Eyes: Conjunctivae are normal. No scleral icterus.  Neck: Normal range of motion. Neck supple. No JVD present.  Cardiovascular: Normal rate, regular rhythm and normal heart sounds. Exam reveals no gallop and no friction rub.  No murmur heard. Pulmonary/Chest: Effort normal and breath sounds normal. No stridor. No respiratory distress. She has no wheezes. She has no  rales.  Abdominal: Soft. Bowel sounds are normal. She exhibits no distension and no mass. There is no tenderness.  Musculoskeletal: Normal range of motion. She exhibits no edema, tenderness or deformity.  Lymphadenopathy:    She has no cervical adenopathy.  Neurological: She is alert and oriented to person, place, and time.  Skin: Skin is warm and dry. Rash noted. No lesion, no petechiae and no purpura noted. Rash is papular. Rash is not nodular, not pustular and not urticarial. She is not diaphoretic. There is erythema.  On the dorsal sides of both upper extremities there are large patches of eczema as evidenced by erythema, scale, and scant excoriation.  Vitals reviewed.   Lab Results  Component Value Date   WBC 8.1 07/06/2017   HGB 14.7 07/06/2017   HCT 44.3 07/06/2017   PLT 284.0 07/06/2017   GLUCOSE 170 (H) 11/29/2017   CHOL 194 07/06/2017   TRIG 170.0 (H) 07/06/2017   HDL 40.40 07/06/2017   LDLDIRECT 142.0 01/21/2015   LDLCALC 120 (H) 07/06/2017   ALT 28 07/06/2017   AST 28 07/06/2017   NA 138 11/29/2017   K 3.5 11/29/2017   CL 101 11/29/2017   CREATININE 0.72 11/29/2017   BUN 13 11/29/2017   CO2 31 11/29/2017   TSH 3.07 07/06/2017   INR 0.89 06/08/2016   HGBA1C 6.4 11/29/2017    Ct Chest Wo Contrast  Result Date: 06/30/2017 CLINICAL DATA:  Followup mediastinal mass EXAM: CT CHEST WITHOUT CONTRAST TECHNIQUE: Multidetector CT imaging of the chest was performed following the standard protocol without IV contrast. COMPARISON:  04/21/2016 FINDINGS: Cardiovascular: The heart size appears normal. No pericardial effusion identified. Mild aortic atherosclerosis. Mediastinum/Nodes: The trachea is patent and midline. Nodule in right lobe of thyroid gland is unchanged at 2 cm. The previously noted anterior mediastinal soft tissue nodule is no longer present. 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 No mediastinal or hilar adenopathy. The esophagus is unremarkable.  Lungs/Pleura: Scarring noted within the anterior right upper lobe. Scar discs shaped nodule along the oblique fissure measures 9 by 0.4 cm and is favored to represent a benign intrapulmonary lymph node. Upper Abdomen: No acute abnormality. Musculoskeletal: No chest wall mass or suspicious bone lesions identified. IMPRESSION: 1. No acute cardiopulmonary abnormalities. 2. No evidence for residual or recurrence of anterior mediastinal soft tissue nodule/mass. 3.  Aortic Atherosclerosis (ICD10-I70.0). Electronically Signed   By: Kerby Moors M.D.   On: 06/30/2017 08:33    Assessment & Plan:   Lezli was seen today for hypertension, osteoarthritis and rash.  Diagnoses and all orders for this visit:  Eczema, unspecified type-I have asked her to try a potent topical steroid. -     Clobetasol Prop Emollient Base (CLOBETASOL PROPIONATE E) 0.05 % emollient cream; Apply 1 application topically 2 (two) times daily.  Essential hypertension- Her blood pressure is adequately well controlled.  Electrolytes and renal function are normal. -     Basic metabolic panel; Future  Prediabetes- Her A1c is at 6.4%.  Medical therapy is  not indicated. -     Hemoglobin A1c; Future  DDD (degenerative disc disease), cervical -     HYDROcodone-acetaminophen (NORCO) 10-325 MG tablet; Take 1 tablet by mouth every 4 (four) hours as needed (for pain).  Primary osteoarthritis involving multiple joints- Will increase the dose of hydrocodone to better control her pain, to allow her to be more active so that she can lose weight and lower her blood sugars. -     HYDROcodone-acetaminophen (NORCO) 10-325 MG tablet; Take 1 tablet by mouth every 4 (four) hours as needed (for pain).  Primary osteoarthritis of right knee -     HYDROcodone-acetaminophen (NORCO) 10-325 MG tablet; Take 1 tablet by mouth every 4 (four) hours as needed (for pain).   I have discontinued Coda Filler. Everton's hydrocortisone. I have also changed her  HYDROcodone-acetaminophen. Additionally, I am having her start on Clobetasol Prop Emollient Base. Lastly, I am having her maintain her aspirin EC, albuterol, ALPRAZolam, esomeprazole, PROAIR HFA, and ibuprofen.  Meds ordered this encounter  Medications  . Clobetasol Prop Emollient Base (CLOBETASOL PROPIONATE E) 0.05 % emollient cream    Sig: Apply 1 application topically 2 (two) times daily.    Dispense:  60 g    Refill:  1  . HYDROcodone-acetaminophen (NORCO) 10-325 MG tablet    Sig: Take 1 tablet by mouth every 4 (four) hours as needed (for pain).    Dispense:  120 tablet    Refill:  0     Follow-up: Return in about 4 months (around 04/01/2018).  Scarlette Calico, MD

## 2017-11-30 ENCOUNTER — Telehealth: Payer: Self-pay

## 2017-11-30 DIAGNOSIS — L820 Inflamed seborrheic keratosis: Secondary | ICD-10-CM | POA: Diagnosis not present

## 2017-11-30 DIAGNOSIS — L564 Polymorphous light eruption: Secondary | ICD-10-CM | POA: Diagnosis not present

## 2017-11-30 NOTE — Telephone Encounter (Signed)
Key: Felicia Odom

## 2017-12-01 ENCOUNTER — Ambulatory Visit: Payer: Medicare Other | Admitting: Internal Medicine

## 2017-12-01 DIAGNOSIS — J449 Chronic obstructive pulmonary disease, unspecified: Secondary | ICD-10-CM | POA: Diagnosis not present

## 2017-12-06 DIAGNOSIS — J410 Simple chronic bronchitis: Secondary | ICD-10-CM | POA: Diagnosis not present

## 2017-12-06 DIAGNOSIS — K210 Gastro-esophageal reflux disease with esophagitis: Secondary | ICD-10-CM | POA: Diagnosis not present

## 2017-12-25 DIAGNOSIS — J449 Chronic obstructive pulmonary disease, unspecified: Secondary | ICD-10-CM | POA: Diagnosis not present

## 2018-01-01 ENCOUNTER — Other Ambulatory Visit: Payer: Self-pay | Admitting: Internal Medicine

## 2018-01-01 DIAGNOSIS — M1711 Unilateral primary osteoarthritis, right knee: Secondary | ICD-10-CM

## 2018-01-01 DIAGNOSIS — M150 Primary generalized (osteo)arthritis: Secondary | ICD-10-CM

## 2018-01-01 DIAGNOSIS — M159 Polyosteoarthritis, unspecified: Secondary | ICD-10-CM

## 2018-01-01 DIAGNOSIS — M5030 Other cervical disc degeneration, unspecified cervical region: Secondary | ICD-10-CM

## 2018-01-01 NOTE — Telephone Encounter (Signed)
Copied from [place] [id number]. Topic: Quick Communication - Rx Refill/Question >> Jan 01, 2018  3:37 PM Percell Belt A wrote: Medication: HYDROcodone-acetaminophen ([place]) 10-325 MG tablet [[id number]]   Has the patient contacted their pharmacy?no  (Agent: If no, request that the patient contact the pharmacy for the refill.) (Agent: If yes, when and what did the pharmacy advise?)  [facility] (with phone number [facility] [address]   Agent: Please be advised that RX refills may take up to 3 business days. We ask that you follow-up with your pharmacy.

## 2018-01-02 ENCOUNTER — Other Ambulatory Visit: Payer: Self-pay | Admitting: Internal Medicine

## 2018-01-02 DIAGNOSIS — Z79891 Long term (current) use of opiate analgesic: Secondary | ICD-10-CM

## 2018-01-02 MED ORDER — NALOXONE HCL 4 MG/0.1ML NA LIQD: 1.0000 | Freq: Once | NASAL | 2 refills | Status: AC

## 2018-01-02 MED ORDER — HYDROCODONE-ACETAMINOPHEN 10-325 MG PO TABS: 1.0000 | ORAL_TABLET | ORAL | 0 refills | Status: DC | PRN

## 2018-01-02 NOTE — Telephone Encounter (Signed)
Refill of Norco  LOV 11/29/17  Dr. Ronnald Ramp  [facility] 11/29/17  #120  0 refills  [id number] [place], [place] - [address]

## 2018-01-09 ENCOUNTER — Telehealth: Payer: Self-pay | Admitting: Emergency Medicine

## 2018-01-09 NOTE — Telephone Encounter (Signed)
Called patient to schedule AWV. Patient declined at this time. 

## 2018-01-17 DIAGNOSIS — J449 Chronic obstructive pulmonary disease, unspecified: Secondary | ICD-10-CM | POA: Diagnosis not present

## 2018-01-26 ENCOUNTER — Telehealth: Payer: Self-pay | Admitting: Internal Medicine

## 2018-01-26 MED ORDER — BENZONATATE 200 MG PO CAPS: 200.0000 mg | ORAL_CAPSULE | Freq: Three times a day (TID) | ORAL | 0 refills | Status: DC | PRN

## 2018-01-26 NOTE — Progress Notes (Signed)
HPI: FU palpitations. Nuclear study October 2017 showed ejection fraction 56%, probable shifting breast attenuation, cannot rule out mild apical ischemia.  Patient had previous resection of thymoma.  Chest CT December 2018 showed no residual mass.  There was note of aortic atherosclerosis.  Monitor 1/19 showed sinus rhythm. Echo 1/19 showed normal LV function, mild AS with mean gradient 14 mmHg. Since last seen she has dyspnea with more vigorous activities.  She denies chest pain.  Palpitations have improved.  No syncope.  Current Outpatient Medications  Medication Sig Dispense Refill  . albuterol (PROVENTIL) (2.5 MG/3ML) 0.083% nebulizer solution Take 3 mLs (2.5 mg total) by nebulization every 6 (six) hours. Dx COPD with bronchitis 120 mL 5  . ALPRAZolam (XANAX) 1 MG tablet Take 1 tablet (1 mg total) by mouth every 6 (six) hours as needed. 120 tablet 5  . aspirin EC 81 MG tablet Take 81 mg by mouth daily.    . benzonatate (TESSALON) 200 MG capsule Take 1 capsule (200 mg total) by mouth every 8 (eight) hours as needed for cough. 40 capsule 0  . esomeprazole (NEXIUM) 40 MG capsule Take 1 capsule (40 mg total) by mouth daily. 90 capsule 1  . HYDROcodone-acetaminophen (NORCO) 10-325 MG tablet Take 1 tablet by mouth every 4 (four) hours as needed (for pain). 90 tablet 0  . ibuprofen (ADVIL,MOTRIN) 800 MG tablet TAKE 1 TABLET BY MOUTH TWICE A DAY WITH FOOD AS NEEDED FOR PAIN 180 tablet 1  . PROAIR HFA 108 (90 Base) MCG/ACT inhaler INHALE 1 TO 2 PUFFS INTO THE LUNGS EVERY 4 HOURS AS NEEDED FOR WHEEZING OR SHORTNESS OF BREATH 8.5 Inhaler 6  . Clobetasol Prop Emollient Base (CLOBETASOL PROPIONATE E) 0.05 % emollient cream Apply 1 application topically 2 (two) times daily. (Patient not taking: Reported on 02/02/2018) 60 g 1   No current facility-administered medications for this visit.      Past Medical History:  Diagnosis Date  . Arthritis    neck  . Asthma   . Chronic back pain   . Chronic  bronchitis ([place])   . Chronic neck pain   . COPD (chronic obstructive pulmonary disease) (H[facility])    Advair and Albuterol as needed as well as neb  . GERD (gastroesophageal reflux disease)    takes Nexium daily  . History of blood transfusion    in the 70's. No abnormal reaction noted  . History of colon polyps    benign  . Hyperlipidemia    not on any meds bc refused to pick up from pharmacy  . Joint pain   . Joint swelling   . Mediastinal tumor   . Nocturia   . OCD (obsessive compulsive disorder)   . Panic disorder    takes Xanax daily as needed  . Pneumonia    2016  . PONV (postoperative nausea and vomiting)   . Seizures ([place])    in the 80's    Past Surgical History:  Procedure Laterality Date  . COLONOSCOPY    . ECTOPIC PREGNANCY SURGERY     x 2   . ESOPHAGOGASTRODUODENOSCOPY    . LAPAROSCOPIC LYSIS INTESTINAL ADHESIONS     x 2  . RESECTION OF MEDIASTINAL MASS N/A 06/10/2016   Procedure: RESECTION OF MEDIASTINAL MASS;  Surgeon: Grace Isaac, MD;  Location: [facility];  Service: Thoracic;  Laterality: N/A;  . VIDEO ASSISTED THORACOSCOPY Right 06/10/2016   Procedure: VIDEO ASSISTED THORACOSCOPY WITH PLACEMENT OF ON Q TUNNELER;  Surgeon: Grace Isaac, MD;  Location: [facility];  Service: Thoracic;  Laterality: Right;  Marland Kitchen VIDEO BRONCHOSCOPY N/A 06/10/2016   Procedure: VIDEO BRONCHOSCOPY;  Surgeon: Grace Isaac, MD;  [facility];  Service: Thoracic;  Laterality: N/A;    Social History   Socioeconomic History  . Marital status: Divorced    Spouse name: Not on file  . Number of children: 1  . Years of education: Not on file  . Highest education level: Not on file  Occupational History  . Occupation: Financial controller at [employer]  . Financial resource strain: Not on file  . Food insecurity:    Worry: Not on file    Inability: Not on file  . Transportation needs:    Medical: Not on file    Non-medical: Not on file  Tobacco Use  . Smoking status:  Former Smoker    Packs/day: 1.00    Years: 20.00    Pack years: 20.00    Types: Cigarettes    Last attempt to quit: 05/18/2002    Years since quitting: 15.7  . Smokeless tobacco: Never Used  . Tobacco comment: quit smoking 54yrs ago  Substance and Sexual Activity  . Alcohol use: No    Alcohol/week: 0.0 oz  . Drug use: No  . Sexual activity: Not on file  Lifestyle  . Physical activity:    Days per week: Not on file    Minutes per session: Not on file  . Stress: Not on file  Relationships  . Social connections:    Talks on phone: Not on file    Gets together: Not on file    Attends religious service: Not on file    Active member of club or organization: Not on file    Attends meetings of clubs or organizations: Not on file    Relationship status: Not on file  . Intimate partner violence:    Fear of current or ex partner: Not on file    Emotionally abused: Not on file    Physically abused: Not on file    Forced sexual activity: Not on file  Other Topics Concern  . Not on file  Social History Narrative  . Not on file    Family History  Problem Relation Age of Onset  . Heart attack Father   . Diabetes Mother   . Clotting disorder Mother        PE  . Breast cancer Sister   . Other Sister        colon mass-benign  . Esophageal cancer Neg Hx   . Colon cancer Neg Hx   . Rectal cancer Neg Hx   . Stomach cancer Neg Hx     ROS: no fevers or chills, productive cough, hemoptysis, dysphasia, odynophagia, melena, hematochezia, dysuria, hematuria, rash, seizure activity, orthopnea, PND, pedal edema, claudication. Remaining systems are negative.  Physical Exam: Well-developed obese in no acute distress.  Skin is warm and dry.  HEENT is normal.  Neck is supple.  Chest is clear to auscultation with normal expansion.  Cardiovascular exam is regular rate and rhythm.  Abdominal exam nontender or distended. No masses palpated. Extremities show no edema. neuro grossly  intact   A/P  1 palpitations-symptoms have improved.  She did not have any symptoms with monitor in place.  We will pursue further if she has more frequent episodes in the future.  Could also consider beta-blocker.  2 mild aortic stenosis-patient will need follow-up echoes  in the future.  3 hyperlipidemia-managed by primary care.  4 obesity-need for weight loss discussed.  Kirk Ruths, MD

## 2018-01-26 NOTE — Telephone Encounter (Signed)
Pt requesting refill of hydromet.  Dr. Annamaria Boots, please advise if it is okay for [place] to refill med for pt.  Thanks!  Allergies  Allergen Reactions  . No Known Allergies     Current Outpatient Medications:  .  albuterol (PROVENTIL) (2.5 MG/3ML) 0.083% nebulizer solution, Take 3 mLs (2.5 mg total) by nebulization every 6 (six) hours. Dx COPD with bronchitis, Disp: 120 mL, Rfl: 5 .  ALPRAZolam (XANAX) 1 MG tablet, Take 1 tablet (1 mg total) by mouth every 6 (six) hours as needed., Disp: 120 tablet, Rfl: 5 .  aspirin EC 81 MG tablet, Take 81 mg by mouth daily., Disp: , Rfl:  .  Clobetasol Prop Emollient Base (CLOBETASOL PROPIONATE E) 0.05 % emollient cream, Apply 1 application topically 2 (two) times daily., Disp: 60 g, Rfl: 1 .  esomeprazole (NEXIUM) 40 MG capsule, Take 1 capsule (40 mg total) by mouth daily., Disp: 90 capsule, Rfl: 1 .  HYDROcodone-acetaminophen (NORCO) 10-325 MG tablet, Take 1 tablet by mouth every 4 (four) hours as needed (for pain)., Disp: 120 tablet, Rfl: 0 .  ibuprofen (ADVIL,MOTRIN) 800 MG tablet, TAKE 1 TABLET BY MOUTH TWICE A DAY WITH FOOD AS NEEDED FOR PAIN, Disp: 180 tablet, Rfl: 1 .  PROAIR HFA 108 (90 Base) MCG/ACT inhaler, INHALE 1 TO 2 PUFFS INTO THE LUNGS EVERY 4 HOURS AS NEEDED FOR WHEEZING OR SHORTNESS OF BREATH, Disp: 8.5 Inhaler, Rfl: 6

## 2018-01-26 NOTE — Telephone Encounter (Signed)
I cant fill this now without seeing her. She is already getting Norco and Xanax. Offer Benzonatate perles 200 mg, # 40, 1 every 8 hours if needed. She can also take Delsym cough syrup.

## 2018-01-26 NOTE — Telephone Encounter (Signed)
Pt is aware of results and voiced her understanding.  Rx for Felicia Odom has been sent to preferred pharmacy. Nothing further is needed.

## 2018-01-29 ENCOUNTER — Telehealth: Payer: Self-pay | Admitting: Internal Medicine

## 2018-01-29 NOTE — Telephone Encounter (Signed)
Copied from [place] [id number]. Topic: Inquiry >> Jan 29, 2018  7:53 AM Pricilla Handler wrote: Reason for CRM: Patient called requesting a refill of HYDROcodone-acetaminophen (NORCO) 10-325 MG tablet. [id number] [address], [place]. [phone] (Phone) [phone] (Fax).         Thank You!!!

## 2018-01-29 NOTE — Telephone Encounter (Signed)
Check [place] registry last filled 01/02/2018.Marland KitchenJohny Chess

## 2018-01-30 ENCOUNTER — Other Ambulatory Visit: Payer: Self-pay | Admitting: Internal Medicine

## 2018-01-30 DIAGNOSIS — M1711 Unilateral primary osteoarthritis, right knee: Secondary | ICD-10-CM

## 2018-01-30 DIAGNOSIS — M5030 Other cervical disc degeneration, unspecified cervical region: Secondary | ICD-10-CM

## 2018-01-30 DIAGNOSIS — M150 Primary generalized (osteo)arthritis: Secondary | ICD-10-CM

## 2018-01-30 DIAGNOSIS — M159 Polyosteoarthritis, unspecified: Secondary | ICD-10-CM

## 2018-01-30 MED ORDER — HYDROCODONE-ACETAMINOPHEN 10-325 MG PO TABS: 1.0000 | ORAL_TABLET | ORAL | 0 refills | Status: DC | PRN

## 2018-02-01 NOTE — Telephone Encounter (Signed)
Patient wants to know why her hydrocodone was reduced from 120 pills to 90.

## 2018-02-01 NOTE — Telephone Encounter (Signed)
I am not comfortable prescribing 120 per month anymore

## 2018-02-02 ENCOUNTER — Encounter

## 2018-02-02 ENCOUNTER — Encounter: Payer: Self-pay | Admitting: Cardiology

## 2018-02-02 ENCOUNTER — Ambulatory Visit (INDEPENDENT_AMBULATORY_CARE_PROVIDER_SITE_OTHER): Payer: Medicare Other | Admitting: Cardiology

## 2018-02-02 VITALS — BP 124/76 | HR 78 | Ht 66.0 in | Wt 203.0 lb

## 2018-02-02 DIAGNOSIS — R002 Palpitations: Secondary | ICD-10-CM

## 2018-02-02 DIAGNOSIS — E7800 Pure hypercholesterolemia, unspecified: Secondary | ICD-10-CM

## 2018-02-02 DIAGNOSIS — I350 Nonrheumatic aortic (valve) stenosis: Secondary | ICD-10-CM | POA: Diagnosis not present

## 2018-02-02 NOTE — Patient Instructions (Signed)
Your physician wants you to follow-up in: ONE YEAR WITH DR CRENSHAW You will receive a reminder letter in the mail two months in advance. If you don't receive a letter, please call our office to schedule the follow-up appointment.   If you need a refill on your cardiac medications before your next appointment, please call your pharmacy.  

## 2018-02-05 DIAGNOSIS — Z1231 Encounter for screening mammogram for malignant neoplasm of breast: Secondary | ICD-10-CM | POA: Diagnosis not present

## 2018-02-05 LAB — HM MAMMOGRAPHY

## 2018-02-08 DIAGNOSIS — J449 Chronic obstructive pulmonary disease, unspecified: Secondary | ICD-10-CM | POA: Diagnosis not present

## 2018-02-26 ENCOUNTER — Other Ambulatory Visit: Payer: Self-pay | Admitting: Internal Medicine

## 2018-02-26 DIAGNOSIS — M150 Primary generalized (osteo)arthritis: Secondary | ICD-10-CM

## 2018-02-26 DIAGNOSIS — M159 Polyosteoarthritis, unspecified: Secondary | ICD-10-CM

## 2018-02-26 DIAGNOSIS — M5030 Other cervical disc degeneration, unspecified cervical region: Secondary | ICD-10-CM

## 2018-02-26 DIAGNOSIS — M1711 Unilateral primary osteoarthritis, right knee: Secondary | ICD-10-CM

## 2018-02-26 NOTE — Telephone Encounter (Signed)
Copied from [place] [id number]. Topic: Quick Communication - Rx Refill/Question >> Feb 26, 2018  2:34 PM Neva Seat wrote: HYDROcodone-acetaminophen [facility]) 10-325 MG tablet  Pt needing a refill  [id number] [address], [place] [place] [address] Phone: [phone] Fax: [phone]

## 2018-02-26 NOTE — Telephone Encounter (Signed)
Hydrocodone refill Last Refill:01/30/18 #90 Last OV: 11/29/17 PCP: Dr. Ronnald Ramp [facility]  [place]

## 2018-02-27 MED ORDER — HYDROCODONE-ACETAMINOPHEN 10-325 MG PO TABS: 1.0000 | ORAL_TABLET | ORAL | 0 refills | Status: DC | PRN

## 2018-03-02 DIAGNOSIS — J449 Chronic obstructive pulmonary disease, unspecified: Secondary | ICD-10-CM | POA: Diagnosis not present

## 2018-03-16 DIAGNOSIS — N951 Menopausal and female climacteric states: Secondary | ICD-10-CM | POA: Diagnosis not present

## 2018-03-16 LAB — BASIC METABOLIC PANEL
BUN: 9 (ref 4–21)
CREATININE: 0.8 (ref 0.5–1.1)
Glucose: 115
Potassium: 4.5 (ref 3.4–5.3)
Sodium: 142 (ref 137–147)

## 2018-03-16 LAB — HEPATIC FUNCTION PANEL
ALK PHOS: 81 (ref 25–125)
ALT: 17 (ref 7–35)
AST: 19 (ref 13–35)
Bilirubin, Total: 0.2

## 2018-03-16 LAB — CBC AND DIFFERENTIAL
HEMATOCRIT: 46 (ref 36–46)
Hemoglobin: 15.3 (ref 12.0–16.0)
PLATELETS: 270 (ref 150–399)
WBC: 5.8

## 2018-03-20 DIAGNOSIS — R232 Flushing: Secondary | ICD-10-CM | POA: Diagnosis not present

## 2018-03-20 DIAGNOSIS — R5383 Other fatigue: Secondary | ICD-10-CM | POA: Diagnosis not present

## 2018-03-20 DIAGNOSIS — G479 Sleep disorder, unspecified: Secondary | ICD-10-CM | POA: Diagnosis not present

## 2018-03-20 DIAGNOSIS — M2550 Pain in unspecified joint: Secondary | ICD-10-CM | POA: Diagnosis not present

## 2018-03-20 DIAGNOSIS — N951 Menopausal and female climacteric states: Secondary | ICD-10-CM | POA: Diagnosis not present

## 2018-03-20 DIAGNOSIS — R7301 Impaired fasting glucose: Secondary | ICD-10-CM | POA: Diagnosis not present

## 2018-03-27 ENCOUNTER — Ambulatory Visit: Payer: Medicare Other | Admitting: Internal Medicine

## 2018-03-27 ENCOUNTER — Encounter: Payer: Self-pay | Admitting: Internal Medicine

## 2018-03-27 ENCOUNTER — Ambulatory Visit (INDEPENDENT_AMBULATORY_CARE_PROVIDER_SITE_OTHER): Payer: Medicare Other | Admitting: Internal Medicine

## 2018-03-27 DIAGNOSIS — F410 Panic disorder [episodic paroxysmal anxiety] without agoraphobia: Secondary | ICD-10-CM | POA: Diagnosis not present

## 2018-03-27 DIAGNOSIS — J449 Chronic obstructive pulmonary disease, unspecified: Secondary | ICD-10-CM | POA: Diagnosis not present

## 2018-03-27 DIAGNOSIS — J4520 Mild intermittent asthma, uncomplicated: Secondary | ICD-10-CM

## 2018-03-27 MED ORDER — ZOSTER VAC RECOMB ADJUVANTED 50 MCG/0.5ML IM SUSR: 0.5000 mL | Freq: Once | INTRAMUSCULAR | 1 refills | Status: AC

## 2018-03-27 MED ORDER — ALPRAZOLAM 1 MG PO TABS: 1.0000 mg | ORAL_TABLET | Freq: Four times a day (QID) | ORAL | 5 refills | Status: DC | PRN

## 2018-03-27 MED ORDER — PROMETHAZINE-CODEINE 6.25-10 MG/5ML PO SYRP: ORAL_SOLUTION | ORAL | 0 refills | Status: DC

## 2018-03-27 MED FILL — SHINGRIX 50 MCG SUS: 50 | 1 days supply | Qty: 1 | Fill #0
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
## 2018-03-27 NOTE — Assessment & Plan Note (Addendum)
Currently good control head of the fall cold and flu season.  She wants to defer flu vaccine until later in the fall-discussed. Plan-use rescue inhaler if needed.  Let us know if there are exacerbations.  She asks to keep cough syrup available mainly for specific occasions such as church.  Educated on use of inhaler as additional help for what may be a cough equivalent asthma at times.

## 2018-03-27 NOTE — Patient Instructions (Signed)
Script refilled for cough syrup and xanax- please use sparingly and make it last  [facility] script for shingles vaccine

## 2018-03-27 NOTE — Assessment & Plan Note (Signed)
Sparing use of alprazolam discussed.  We have been filling it for her for occasional use if needed for a long time without problems.  She needs to discuss this with her PCP as part of her overall care.

## 2018-04-02 ENCOUNTER — Encounter: Payer: Self-pay | Admitting: Internal Medicine

## 2018-04-02 ENCOUNTER — Ambulatory Visit (INDEPENDENT_AMBULATORY_CARE_PROVIDER_SITE_OTHER): Payer: Medicare Other | Admitting: Internal Medicine

## 2018-04-02 VITALS — BP 130/78 | HR 71 | Temp 98.0°F | Resp 16 | Ht 66.0 in | Wt 206.5 lb

## 2018-04-02 DIAGNOSIS — K219 Gastro-esophageal reflux disease without esophagitis: Secondary | ICD-10-CM

## 2018-04-02 DIAGNOSIS — M5030 Other cervical disc degeneration, unspecified cervical region: Secondary | ICD-10-CM | POA: Diagnosis not present

## 2018-04-02 DIAGNOSIS — I10 Essential (primary) hypertension: Secondary | ICD-10-CM | POA: Diagnosis not present

## 2018-04-02 DIAGNOSIS — M1711 Unilateral primary osteoarthritis, right knee: Secondary | ICD-10-CM

## 2018-04-02 DIAGNOSIS — M159 Polyosteoarthritis, unspecified: Secondary | ICD-10-CM

## 2018-04-02 DIAGNOSIS — M150 Primary generalized (osteo)arthritis: Secondary | ICD-10-CM

## 2018-04-02 MED ORDER — HYDROCODONE-ACETAMINOPHEN 10-325 MG PO TABS: 1.0000 | ORAL_TABLET | ORAL | 0 refills | Status: DC | PRN

## 2018-04-02 MED ORDER — IBUPROFEN 800 MG PO TABS: 800.0000 mg | ORAL_TABLET | Freq: Three times a day (TID) | ORAL | 1 refills | Status: DC | PRN

## 2018-04-02 MED ORDER — ESOMEPRAZOLE MAGNESIUM 40 MG PO CPDR: 40.0000 mg | DELAYED_RELEASE_CAPSULE | Freq: Every day | ORAL | 1 refills | Status: DC

## 2018-04-02 NOTE — Progress Notes (Signed)
Subjective:  Patient ID: Felicia Odom, female    DOB: [birth year]  Age: 62 y.o. MRN: [id number]  CC: Hypertension; Osteoarthritis; and Gastroesophageal Reflux   HPI Felicia Odom presents for f/up - She was recently seen at [facility] to start a weight management program and has some labs done (abstracted). She has been successful thus far.  Outpatient Medications Prior to Visit  Medication Sig Dispense Refill  . albuterol (PROVENTIL) (2.5 MG/3ML) 0.083% nebulizer solution Take 3 mLs (2.5 mg total) by nebulization every 6 (six) hours. Dx COPD with bronchitis 120 mL 5  . ALPRAZolam (XANAX) 1 MG tablet Take 1 tablet (1 mg total) by mouth every 6 (six) hours as needed. 120 tablet 5  . aspirin EC 81 MG tablet Take 81 mg by mouth daily.    Marland Kitchen PROAIR HFA 108 (90 Base) MCG/ACT inhaler INHALE 1 TO 2 PUFFS INTO THE LUNGS EVERY 4 HOURS AS NEEDED FOR WHEEZING OR SHORTNESS OF BREATH 8.5 Inhaler 6  . esomeprazole (NEXIUM) 40 MG capsule Take 1 capsule (40 mg total) by mouth daily. 90 capsule 1  . HYDROcodone-acetaminophen (NORCO) 10-325 MG tablet Take 1 tablet by mouth every 4 (four) hours as needed (for pain). 90 tablet 0  . ibuprofen (ADVIL,MOTRIN) 800 MG tablet TAKE 1 TABLET BY MOUTH TWICE A DAY WITH FOOD AS NEEDED FOR PAIN 180 tablet 1  . NARCAN 4 MG/0.1ML LIQD nasal spray kit PLACE 1 SPRAY INTO THE NOSE ONCE FOR 1 DOSE  2  . promethazine-codeine (PHENERGAN WITH CODEINE) 6.25-10 MG/5ML syrup 5 ml every 8 hours if needed for cough 200 mL 0   No facility-administered medications prior to visit.     ROS Review of Systems  Constitutional: Negative for appetite change, diaphoresis, fatigue and unexpected weight change.  HENT: Negative.  Negative for trouble swallowing.   Eyes: Negative for visual disturbance.  Respiratory: Negative for cough, chest tightness, shortness of breath and wheezing.   Cardiovascular: Negative for chest pain, palpitations and leg swelling.  Gastrointestinal:  Negative for abdominal pain, constipation, diarrhea, nausea and vomiting.  Endocrine: Negative.   Genitourinary: Negative.  Negative for difficulty urinating.  Musculoskeletal: Positive for arthralgias and neck pain. Negative for back pain and myalgias.  Allergic/Immunologic: Negative.   Neurological: Negative.  Negative for dizziness, weakness and light-headedness.  Hematological: Negative for adenopathy. Does not bruise/bleed easily.  Psychiatric/Behavioral: Negative for behavioral problems, dysphoric mood, hallucinations, sleep disturbance and suicidal ideas. The patient is nervous/anxious.     Objective:  BP 130/78 (BP Location: Left Arm, Patient Position: Sitting, Cuff Size: Large)   Pulse 71   Temp 98 F (36.7 C) (Oral)   Resp 16   Ht 5' 6"  (1.676 m)   Wt 206 lb 8 oz (93.7 kg)   SpO2 97%   BMI 33.33 kg/m   BP Readings from Last 3 Encounters:  04/02/18 130/78  03/27/18 130/80  02/02/18 124/76    Wt Readings from Last 3 Encounters:  04/02/18 206 lb 8 oz (93.7 kg)  03/27/18 210 lb (95.3 kg)  02/02/18 203 lb (92.1 kg)    Physical Exam  Constitutional: She is oriented to person, place, and time. No distress.  HENT:  Mouth/Throat: Oropharynx is clear and moist. No oropharyngeal exudate.  Eyes: Conjunctivae are normal. No scleral icterus.  Neck: Normal range of motion. Neck supple. No JVD present. No thyromegaly present.  Cardiovascular: Normal rate, regular rhythm and normal heart sounds. Exam reveals no gallop and no friction rub.  No  murmur heard. Pulmonary/Chest: Effort normal and breath sounds normal. No respiratory distress. She has no wheezes. She has no rhonchi. She has no rales.  Abdominal: Soft. Normal appearance and bowel sounds are normal. She exhibits no mass. There is no hepatosplenomegaly. There is no tenderness.  Musculoskeletal: Normal range of motion. She exhibits no edema, tenderness or deformity.  Lymphadenopathy:    She has no cervical adenopathy.    Neurological: She is alert and oriented to person, place, and time.  Skin: Skin is warm and dry. She is not diaphoretic. No pallor.  Psychiatric: She has a normal mood and affect. Her behavior is normal. Judgment and thought content normal.  Vitals reviewed.   Lab Results  Component Value Date   WBC 5.8 03/16/2018   HGB 15.3 03/16/2018   HCT 46 03/16/2018   PLT 270 03/16/2018   GLUCOSE 170 (H) 11/29/2017   CHOL 194 07/06/2017   TRIG 170.0 (H) 07/06/2017   HDL 40.40 07/06/2017   LDLDIRECT 142.0 01/21/2015   LDLCALC 120 (H) 07/06/2017   ALT 17 03/16/2018   AST 19 03/16/2018   NA 142 03/16/2018   K 4.5 03/16/2018   CL 101 11/29/2017   CREATININE 0.8 03/16/2018   BUN 9 03/16/2018   CO2 31 11/29/2017   TSH 3.07 07/06/2017   INR 0.89 06/08/2016   HGBA1C 6.4 11/29/2017    Ct Chest Wo Contrast  Result Date: 06/30/2017 CLINICAL DATA:  Followup mediastinal mass EXAM: CT CHEST WITHOUT CONTRAST TECHNIQUE: Multidetector CT imaging of the chest was performed following the standard protocol without IV contrast. COMPARISON:  04/21/2016 FINDINGS: Cardiovascular: The heart size appears normal. No pericardial effusion identified. Mild aortic atherosclerosis. Mediastinum/Nodes: The trachea is patent and midline. Nodule in right lobe of thyroid gland is unchanged at 2 cm. The previously noted anterior mediastinal soft tissue nodule is no longer present. 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 No mediastinal or hilar adenopathy. The esophagus is unremarkable. Lungs/Pleura: Scarring noted within the anterior right upper lobe. Scar discs shaped nodule along the oblique fissure measures 9 by 0.4 cm and is favored to represent a benign intrapulmonary lymph node. Upper Abdomen: No acute abnormality. Musculoskeletal: No chest wall mass or suspicious bone lesions identified. IMPRESSION: 1. No acute cardiopulmonary abnormalities. 2. No evidence for residual or recurrence of anterior mediastinal soft  tissue nodule/mass. 3.  Aortic Atherosclerosis (ICD10-I70.0). Electronically Signed   By: Kerby Moors M.D.   On: 06/30/2017 08:33    Assessment & Plan:   Jacoria was seen today for hypertension, osteoarthritis and gastroesophageal reflux.  Diagnoses and all orders for this visit:  Essential hypertension- Her BP is well controlled, recent lytes and renal fxn were normal.  Gastroesophageal reflux disease without esophagitis -     esomeprazole (NEXIUM) 40 MG capsule; Take 1 capsule (40 mg total) by mouth daily.  Primary osteoarthritis involving multiple joints -     ibuprofen (ADVIL,MOTRIN) 800 MG tablet; Take 1 tablet (800 mg total) by mouth every 8 (eight) hours as needed. -     HYDROcodone-acetaminophen (NORCO) 10-325 MG tablet; Take 1 tablet by mouth every 4 (four) hours as needed (for pain).  Primary osteoarthritis of right knee  DDD (degenerative disc disease), cervical -     ibuprofen (ADVIL,MOTRIN) 800 MG tablet; Take 1 tablet (800 mg total) by mouth every 8 (eight) hours as needed. -     HYDROcodone-acetaminophen (NORCO) 10-325 MG tablet; Take 1 tablet by mouth every 4 (four) hours as needed (for pain).  I have discontinued Caydence Koenig. Wessman's promethazine-codeine. I have also changed her ibuprofen. Additionally, I am having her maintain her aspirin EC, albuterol, PROAIR HFA, NARCAN, ALPRAZolam, HYDROcodone-acetaminophen, and esomeprazole.  Meds ordered this encounter  Medications  . ibuprofen (ADVIL,MOTRIN) 800 MG tablet    Sig: Take 1 tablet (800 mg total) by mouth every 8 (eight) hours as needed.    Dispense:  180 tablet    Refill:  1  . HYDROcodone-acetaminophen (NORCO) 10-325 MG tablet    Sig: Take 1 tablet by mouth every 4 (four) hours as needed (for pain).    Dispense:  90 tablet    Refill:  0  . esomeprazole (NEXIUM) 40 MG capsule    Sig: Take 1 capsule (40 mg total) by mouth daily.    Dispense:  90 capsule    Refill:  1     Follow-up: No follow-ups  on file.  Scarlette Calico, MD

## 2018-04-02 NOTE — Patient Instructions (Signed)

## 2018-04-09 DIAGNOSIS — M2550 Pain in unspecified joint: Secondary | ICD-10-CM | POA: Diagnosis not present

## 2018-04-16 DIAGNOSIS — M2550 Pain in unspecified joint: Secondary | ICD-10-CM | POA: Diagnosis not present

## 2018-04-16 DIAGNOSIS — R7301 Impaired fasting glucose: Secondary | ICD-10-CM | POA: Diagnosis not present

## 2018-04-16 DIAGNOSIS — Z713 Dietary counseling and surveillance: Secondary | ICD-10-CM | POA: Diagnosis not present

## 2018-04-20 DIAGNOSIS — J449 Chronic obstructive pulmonary disease, unspecified: Secondary | ICD-10-CM | POA: Diagnosis not present

## 2018-04-30 ENCOUNTER — Other Ambulatory Visit: Payer: Self-pay | Admitting: Internal Medicine

## 2018-04-30 DIAGNOSIS — M150 Primary generalized (osteo)arthritis: Secondary | ICD-10-CM

## 2018-04-30 DIAGNOSIS — M5030 Other cervical disc degeneration, unspecified cervical region: Secondary | ICD-10-CM

## 2018-04-30 DIAGNOSIS — M159 Polyosteoarthritis, unspecified: Secondary | ICD-10-CM

## 2018-04-30 NOTE — Telephone Encounter (Signed)
Copied from [place] [phone]. Topic: Quick Communication - Rx Refill/Question >> Apr 30, 2018 10:22 AM Reyne Dumas L wrote: Medication:  HYDROcodone-acetaminophen (NORCO) 10-325 MG tablet  Has the patient contacted their pharmacy? No - controlled substance (Agent: If no, request that the patient contact the pharmacy for the refill.) (Agent: If yes, when and what did the pharmacy advise?)  Preferred Pharmacy (with phone number or street name): [id number] [address], [place]. [phone] (Phone) [phone] (Fax)  Agent: Please be advised that RX refills may take up to 3 business days. We ask that you follow-up with your pharmacy.

## 2018-05-01 MED ORDER — HYDROCODONE-ACETAMINOPHEN 10-325 MG PO TABS: 1.0000 | ORAL_TABLET | ORAL | 0 refills | Status: DC | PRN

## 2018-05-01 NOTE — Telephone Encounter (Signed)
Check [place] registry last filled 04/02/2018.Marland KitchenJohny Chess

## 2018-05-01 NOTE — Telephone Encounter (Signed)
MD approved and sent electronically to pof../lmb  

## 2018-05-01 NOTE — Telephone Encounter (Signed)
Requested medication (s) are due for refill today: yes  Requested medication (s) are on the active medication list: yes  Last refill:  04/02/18  Future visit scheduled: yes  Notes to clinic:  controlled substance   Requested Prescriptions  Pending Prescriptions Disp Refills   HYDROcodone-acetaminophen (NORCO) 10-325 MG tablet 90 tablet 0    Sig: Take 1 tablet by mouth every 4 (four) hours as needed (for pain).     Not Delegated - Analgesics:  Opioid Agonist Combinations Failed - 04/30/2018 10:47 AM      Failed - This refill cannot be delegated      Failed - Urine Drug Screen completed in last 360 days.      Passed - Valid encounter within last 6 months    Recent Outpatient Visits          4 weeks ago Essential hypertension   [place], MD   5 months ago Eczema, unspecified type   [place], Thomas L, MD   9 months ago Essential hypertension   [place], Thomas L, MD   1 year ago Essential hypertension   [place], Thomas L, MD   1 year ago Essential hypertension   [place], MD      Future Appointments            In 3 months Ronnald Ramp Arvid Right, MD [place], [place]   In 4 months Deneise Lever, MD [facility]

## 2018-05-11 DIAGNOSIS — J449 Chronic obstructive pulmonary disease, unspecified: Secondary | ICD-10-CM | POA: Diagnosis not present

## 2018-05-15 DIAGNOSIS — M2550 Pain in unspecified joint: Secondary | ICD-10-CM | POA: Diagnosis not present

## 2018-05-25 ENCOUNTER — Other Ambulatory Visit: Payer: Self-pay | Admitting: Internal Medicine

## 2018-05-25 DIAGNOSIS — M159 Polyosteoarthritis, unspecified: Secondary | ICD-10-CM

## 2018-05-25 DIAGNOSIS — M5030 Other cervical disc degeneration, unspecified cervical region: Secondary | ICD-10-CM

## 2018-05-25 DIAGNOSIS — M150 Primary generalized (osteo)arthritis: Secondary | ICD-10-CM

## 2018-05-25 NOTE — Telephone Encounter (Signed)
Requested medication (s) are due for refill today: yes on 05/31/18  Requested medication (s) are on the active medication list: yes  Last refill:  04/30/18  Future visit scheduled: yes      Requested Prescriptions  Pending Prescriptions Disp Refills   HYDROcodone-acetaminophen (NORCO) 10-325 MG tablet 90 tablet 0    Sig: Take 1 tablet by mouth every 4 (four) hours as needed (for pain).     Not Delegated - Analgesics:  Opioid Agonist Combinations Failed - 05/25/2018  2:16 PM      Failed - This refill cannot be delegated      Failed - Urine Drug Screen completed in last 360 days.      Passed - Valid encounter within last 6 months    Recent Outpatient Visits          1 month ago Essential hypertension   [place], MD   5 months ago Eczema, unspecified type   [place], Thomas L, MD   10 months ago Essential hypertension   [place], Thomas L, MD   1 year ago Essential hypertension   [place], Thomas L, MD   1 year ago Essential hypertension   [place], MD      Future Appointments            In 2 months [name], MD [place], [place]   In 4 months Deneise Lever, MD [facility]

## 2018-05-25 NOTE — Telephone Encounter (Signed)
Copied from [place] [phone]. Topic: Quick Communication - See Telephone Encounter >> May 25, 2018  2:14 PM Conception Chancy, NT wrote: CRM for notification. See Telephone encounter for: 05/25/18.  Patient is calling and requesting a refill on HYDROcodone-acetaminophen (NORCO) 10-325 MG tablet. She states she understands it is early to refill but she wanted to get her request in time. She said it is due November 7th.   [id number] [address], [place] [address] Phone: [phone] Fax: [phone]

## 2018-05-28 DIAGNOSIS — M2550 Pain in unspecified joint: Secondary | ICD-10-CM | POA: Diagnosis not present

## 2018-05-28 MED ORDER — HYDROCODONE-ACETAMINOPHEN 10-325 MG PO TABS: 1.0000 | ORAL_TABLET | ORAL | 0 refills | Status: DC | PRN

## 2018-05-28 NOTE — Telephone Encounter (Signed)
[place] Controlled Substance Database checked. Last filled on  05/01/18. 15 day supply

## 2018-06-01 DIAGNOSIS — J449 Chronic obstructive pulmonary disease, unspecified: Secondary | ICD-10-CM | POA: Diagnosis not present

## 2018-06-08 MED FILL — SHINGRIX 50 MCG SUS: 50 | 1 days supply | Qty: 1 | Fill #1

## 2018-06-18 ENCOUNTER — Other Ambulatory Visit: Payer: Self-pay | Admitting: Acute Care

## 2018-06-18 DIAGNOSIS — Z87891 Personal history of nicotine dependence: Secondary | ICD-10-CM

## 2018-06-18 DIAGNOSIS — Z122 Encounter for screening for malignant neoplasm of respiratory organs: Secondary | ICD-10-CM

## 2018-06-20 ENCOUNTER — Other Ambulatory Visit: Payer: Self-pay

## 2018-06-20 MED ORDER — ALBUTEROL SULFATE HFA 108 (90 BASE) MCG/ACT IN AERS: INHALATION_SPRAY | RESPIRATORY_TRACT | 6 refills | Status: DC

## 2018-06-25 ENCOUNTER — Other Ambulatory Visit: Payer: Self-pay | Admitting: Internal Medicine

## 2018-06-25 DIAGNOSIS — M159 Polyosteoarthritis, unspecified: Secondary | ICD-10-CM

## 2018-06-25 DIAGNOSIS — M5030 Other cervical disc degeneration, unspecified cervical region: Secondary | ICD-10-CM

## 2018-06-25 DIAGNOSIS — M150 Primary generalized (osteo)arthritis: Secondary | ICD-10-CM

## 2018-06-25 NOTE — Telephone Encounter (Signed)
Copied from [place] [phone]. Topic: General - Other >> Jun 25, 2018  7:35 AM Lennox Solders wrote: Reason for CRM: pt is calling and needs a refill on hydrocodone. [facility] [address]. Pt said she calls the office every month to have med refill

## 2018-06-25 NOTE — Telephone Encounter (Signed)
Requested medication (s) are due for refill today: yes  Requested medication (s) are on the active medication list: yes    Last refill: 05/28/18 #90 0 refills  Future visit scheduled yes 07/30/2018   Notes to clinic:not delegated  Requested Prescriptions  Pending Prescriptions Disp Refills   HYDROcodone-acetaminophen (NORCO) 10-325 MG tablet 90 tablet 0    Sig: Take 1 tablet by mouth every 4 (four) hours as needed (for pain).     Not Delegated - Analgesics:  Opioid Agonist Combinations Failed - 06/25/2018  8:23 AM      Failed - This refill cannot be delegated      Failed - Urine Drug Screen completed in last 360 days.      Passed - Valid encounter within last 6 months    Recent Outpatient Visits          2 months ago Essential hypertension   [place], MD   6 months ago Eczema, unspecified type   [place], Thomas L, MD   11 months ago Essential hypertension   [place], Thomas L, MD   1 year ago Essential hypertension   [place], Thomas L, MD   1 year ago Essential hypertension   [place], MD      Future Appointments            In 1 month Ronnald Ramp Arvid Right, MD [place], [place]   In 3 months Deneise Lever, MD [facility]

## 2018-06-26 DIAGNOSIS — J449 Chronic obstructive pulmonary disease, unspecified: Secondary | ICD-10-CM | POA: Diagnosis not present

## 2018-06-26 MED ORDER — HYDROCODONE-ACETAMINOPHEN 10-325 MG PO TABS: 1.0000 | ORAL_TABLET | ORAL | 0 refills | Status: DC | PRN

## 2018-06-26 NOTE — Telephone Encounter (Signed)
Last filled on 05/28/2018.

## 2018-07-11 ENCOUNTER — Ambulatory Visit (INDEPENDENT_AMBULATORY_CARE_PROVIDER_SITE_OTHER): Payer: Medicare Other | Admitting: Acute Care

## 2018-07-11 ENCOUNTER — Encounter: Payer: Self-pay | Admitting: Acute Care

## 2018-07-11 ENCOUNTER — Ambulatory Visit (INDEPENDENT_AMBULATORY_CARE_PROVIDER_SITE_OTHER)
Admission: RE | Admit: 2018-07-11 | Discharge: 2018-07-11 | Disposition: A | Discharge status: Home / Self Care | Payer: Medicare Other | Source: Ambulatory Visit | Attending: Acute Care | Admitting: Acute Care

## 2018-07-11 VITALS — BP 130/82 | HR 77 | Ht 66.25 in | Wt 198.0 lb

## 2018-07-11 DIAGNOSIS — Z87891 Personal history of nicotine dependence: Secondary | ICD-10-CM

## 2018-07-11 DIAGNOSIS — Z122 Encounter for screening for malignant neoplasm of respiratory organs: Secondary | ICD-10-CM

## 2018-07-11 NOTE — Progress Notes (Signed)
Shared Decision Making Visit Lung Cancer Screening Program [phone])   Eligibility:  Age 62 y.o.  Pack Years Smoking History Calculation 33 pack year smoking history (# packs/per year x # years smoked)  Recent History of coughing up blood  no  Unexplained weight loss? no ( >Than 15 pounds within the last 6 months )  Prior History Lung / other cancer no (Diagnosis within the last 5 years already requiring surveillance chest CT Scans).  Smoking Status Former Smoker  Former Smokers: Years since quit: 2 years  Quit Date: 2017   Visit Components:  Discussion included one or more decision making aids. yes  Discussion included risk/benefits of screening. yes  Discussion included potential follow up diagnostic testing for abnormal scans. yes  Discussion included meaning and risk of over diagnosis. yes  Discussion included meaning and risk of False Positives. yes  Discussion included meaning of total radiation exposure. yes  Counseling Included:  Importance of adherence to annual lung cancer LDCT screening. yes  Impact of comorbidities on ability to participate in the program. yes  Ability and willingness to under diagnostic treatment. yes  Smoking Cessation Counseling:  Current Smokers:   Discussed importance of smoking cessation. No: Former smoker   Information about tobacco cessation classes and interventions provided to patient. yes  Patient provided with "ticket" for LDCT Scan. yes  Symptomatic Patient. no  Counseling  Diagnosis Code: Tobacco Use Z72.0  Asymptomatic Patient yes  Counseling (Intermediate counseling: > three minutes counseling) G0174  Former Smokers:   Discussed the importance of maintaining cigarette abstinence. yes  Diagnosis Code: Personal History of Nicotine Dependence. B44.967  Information about tobacco cessation classes and interventions provided to patient. Yes  Patient provided with "ticket" for LDCT Scan. yes  Written Order  for Lung Cancer Screening with LDCT placed in Epic. Yes (CT Chest Lung Cancer Screening Low Dose W/O CM) [id number] Z12.2-Screening of respiratory organs Z87.891-Personal history of nicotine dependence  I have spent 25 minutes of face to face time with  Ms. Goates discussing the risks and benefits of lung cancer screening. We viewed a power point together that explained in detail the above noted topics. We paused at intervals to allow for questions to be asked and answered to ensure understanding.We discussed that the single most powerful action that she can take to decrease her risk of developing lung cancer is to quit smoking. We discussed whether or not she is ready to commit to setting a quit date. We discussed options for tools to aid in quitting smoking including nicotine replacement therapy, non-nicotine medications, support groups, Quit Smart classes, and behavior modification. We discussed that often times setting smaller, more achievable goals, such as eliminating 1 cigarette a day for a week and then 2 cigarettes a day for a week can be helpful in slowly decreasing the number of cigarettes smoked. This allows for a sense of accomplishment as well as providing a clinical benefit. I gave her the " Be Stronger Than Your Excuses" card with contact information for community resources, classes, free nicotine replacement therapy, and access to mobile apps, text messaging, and on-line smoking cessation help. I have also given her my card and contact information in the event she needs to contact me. We discussed the time and location of the scan, and that either Doroteo Glassman RN or I will call with the results within 24-48 hours of receiving them. I have offered her  a copy of the power point we viewed  as a resource in  the event they need reinforcement of the concepts we discussed today in the office. The patient verbalized understanding of all of  the above and had no further questions upon leaving the  office. They have my contact information in the event they have any further questions.  I spent 3 minutes counseling on smoking cessation and the health risks of continued tobacco abuse.  I explained to the patient that there has been a high incidence of coronary artery disease noted on these exams. I explained that this is a non-gated exam therefore degree or severity cannot be determined. This patient is  Not on statin therapy. I have asked the patient to follow-up with their PCP regarding any incidental finding of coronary artery disease and management with diet or medication as their PCP  feels is clinically indicated. The patient verbalized understanding of the above and had no further questions upon completion of the visit.   Magdalen Spatz, NP 07/11/2018 12:08 PM

## 2018-07-13 ENCOUNTER — Other Ambulatory Visit: Payer: Self-pay | Admitting: Acute Care

## 2018-07-13 DIAGNOSIS — Z122 Encounter for screening for malignant neoplasm of respiratory organs: Secondary | ICD-10-CM

## 2018-07-13 DIAGNOSIS — Z87891 Personal history of nicotine dependence: Secondary | ICD-10-CM

## 2018-07-16 DIAGNOSIS — J449 Chronic obstructive pulmonary disease, unspecified: Secondary | ICD-10-CM | POA: Diagnosis not present

## 2018-07-23 ENCOUNTER — Other Ambulatory Visit: Payer: Self-pay | Admitting: Internal Medicine

## 2018-07-23 DIAGNOSIS — L309 Dermatitis, unspecified: Secondary | ICD-10-CM

## 2018-07-27 ENCOUNTER — Other Ambulatory Visit: Payer: Self-pay | Admitting: Internal Medicine

## 2018-07-27 DIAGNOSIS — M150 Primary generalized (osteo)arthritis: Principal | ICD-10-CM

## 2018-07-27 DIAGNOSIS — M159 Polyosteoarthritis, unspecified: Secondary | ICD-10-CM

## 2018-07-27 DIAGNOSIS — M5030 Other cervical disc degeneration, unspecified cervical region: Secondary | ICD-10-CM

## 2018-07-30 ENCOUNTER — Ambulatory Visit (INDEPENDENT_AMBULATORY_CARE_PROVIDER_SITE_OTHER): Payer: Medicare Other | Admitting: Internal Medicine

## 2018-07-30 ENCOUNTER — Other Ambulatory Visit (INDEPENDENT_AMBULATORY_CARE_PROVIDER_SITE_OTHER): Payer: Medicare Other

## 2018-07-30 ENCOUNTER — Encounter: Payer: Self-pay | Admitting: Internal Medicine

## 2018-07-30 VITALS — BP 138/80 | HR 68 | Temp 97.7°F | Resp 16 | Ht 66.25 in | Wt 201.0 lb

## 2018-07-30 DIAGNOSIS — R7303 Prediabetes: Secondary | ICD-10-CM

## 2018-07-30 DIAGNOSIS — Z79891 Long term (current) use of opiate analgesic: Secondary | ICD-10-CM

## 2018-07-30 DIAGNOSIS — Z114 Encounter for screening for human immunodeficiency virus [HIV]: Secondary | ICD-10-CM

## 2018-07-30 DIAGNOSIS — E785 Hyperlipidemia, unspecified: Secondary | ICD-10-CM

## 2018-07-30 DIAGNOSIS — I10 Essential (primary) hypertension: Secondary | ICD-10-CM | POA: Diagnosis not present

## 2018-07-30 DIAGNOSIS — M5030 Other cervical disc degeneration, unspecified cervical region: Secondary | ICD-10-CM | POA: Diagnosis not present

## 2018-07-30 DIAGNOSIS — M150 Primary generalized (osteo)arthritis: Secondary | ICD-10-CM

## 2018-07-30 DIAGNOSIS — M159 Polyosteoarthritis, unspecified: Secondary | ICD-10-CM

## 2018-07-30 LAB — CBC WITH DIFFERENTIAL/PLATELET
Basophils Absolute: 0.1 10*3/uL (ref 0.0–0.1)
Basophils Relative: 1.2 % (ref 0.0–3.0)
EOS ABS: 0.1 10*3/uL (ref 0.0–0.7)
Eosinophils Relative: 2.2 % (ref 0.0–5.0)
HCT: 43.8 % (ref 36.0–46.0)
Hemoglobin: 14.7 g/dL (ref 12.0–15.0)
LYMPHS ABS: 1.6 10*3/uL (ref 0.7–4.0)
Lymphocytes Relative: 30.2 % (ref 12.0–46.0)
MCHC: 33.6 g/dL (ref 30.0–36.0)
MCV: 85.4 fl (ref 78.0–100.0)
MONO ABS: 0.5 10*3/uL (ref 0.1–1.0)
Monocytes Relative: 9.3 % (ref 3.0–12.0)
Neutro Abs: 3.1 10*3/uL (ref 1.4–7.7)
Neutrophils Relative %: 57.1 % (ref 43.0–77.0)
Platelets: 266 10*3/uL (ref 150.0–400.0)
RBC: 5.13 Mil/uL — ABNORMAL HIGH (ref 3.87–5.11)
RDW: 14.3 % (ref 11.5–15.5)
WBC: 5.4 10*3/uL (ref 4.0–10.5)

## 2018-07-30 LAB — COMPREHENSIVE METABOLIC PANEL
ALBUMIN: 3.9 g/dL (ref 3.5–5.2)
ALT: 15 U/L (ref 0–35)
AST: 15 U/L (ref 0–37)
Alkaline Phosphatase: 62 U/L (ref 39–117)
BUN: 10 mg/dL (ref 6–23)
CO2: 27 mEq/L (ref 19–32)
Calcium: 9.3 mg/dL (ref 8.4–10.5)
Chloride: 106 mEq/L (ref 96–112)
Creatinine, Ser: 0.7 mg/dL (ref 0.40–1.20)
GFR: 90.18 mL/min (ref >60.00)
Glucose, Bld: 96 mg/dL (ref 70–99)
Potassium: 4.2 mEq/L (ref 3.5–5.1)
Sodium: 140 mEq/L (ref 135–145)
Total Bilirubin: 0.3 mg/dL (ref 0.2–1.2)
Total Protein: 6.7 g/dL (ref 6.0–8.3)

## 2018-07-30 LAB — URINALYSIS, ROUTINE W REFLEX MICROSCOPIC
BILIRUBIN URINE: NEGATIVE
Hgb urine dipstick: NEGATIVE
Ketones, ur: NEGATIVE
Leukocytes, UA: NEGATIVE
NITRITE: NEGATIVE
RBC / HPF: NONE SEEN (ref 0–?)
Specific Gravity, Urine: <=1.005 — AB (ref 1.000–1.030)
TOTAL PROTEIN, URINE-UPE24: NEGATIVE
Urine Glucose: NEGATIVE
Urobilinogen, UA: 0.2 (ref 0.0–1.0)
WBC, UA: NONE SEEN (ref 0–?)
pH: 6 (ref 5.0–8.0)

## 2018-07-30 LAB — LIPID PANEL
Cholesterol: 180 mg/dL (ref 0–200)
HDL: 46.2 mg/dL (ref >39.00)
LDL Cholesterol: 110 mg/dL — ABNORMAL HIGH (ref 0–99)
NonHDL: 134.02
Total CHOL/HDL Ratio: 4
Triglycerides: 121 mg/dL (ref 0.0–149.0)
VLDL: 24.2 mg/dL (ref 0.0–40.0)

## 2018-07-30 LAB — HEMOGLOBIN A1C: Hgb A1c MFr Bld: 6 % (ref 4.6–6.5)

## 2018-07-30 LAB — TSH: TSH: 2.05 u[IU]/mL (ref 0.35–4.50)

## 2018-07-30 MED ORDER — HYDROCODONE-ACETAMINOPHEN 10-325 MG PO TABS: 1.0000 | ORAL_TABLET | ORAL | 0 refills | Status: DC | PRN

## 2018-07-30 NOTE — Progress Notes (Signed)
Subjective:  Patient ID: Felicia Odom, female    DOB: Feb 18, 1956  Age: 63 y.o. MRN: [id number]  CC: Hypertension and Hyperlipidemia   HPI LYNNEX FULP presents for f/up - She has been working on her lifestyle modifications to control her blood pressure and her blood sugar.  She walks about 3 miles a day and does not experience CP, DOE, palpitations, edema, or fatigue.  She continues to complain of pain and wants a refill of hydrocodone.  Outpatient Medications Prior to Visit  Medication Sig Dispense Refill  . albuterol (PROAIR HFA) 108 (90 Base) MCG/ACT inhaler INHALE 1 TO 2 PUFFS INTO THE LUNGS EVERY 4 HOURS AS NEEDED FOR WHEEZING OR SHORTNESS OF BREATH 8.5 Inhaler 6  . albuterol (PROVENTIL) (2.5 MG/3ML) 0.083% nebulizer solution Take 3 mLs (2.5 mg total) by nebulization every 6 (six) hours. Dx COPD with bronchitis 120 mL 5  . ALPRAZolam (XANAX) 1 MG tablet Take 1 tablet (1 mg total) by mouth every 6 (six) hours as needed. 120 tablet 5  . aspirin EC 81 MG tablet Take 81 mg by mouth daily.    . Clobetasol Prop Emollient Base 0.05 % emollient cream APPLY TO AFFECTED AREA TWICE A DAY 60 g 1  . esomeprazole (NEXIUM) 40 MG capsule Take 1 capsule (40 mg total) by mouth daily. 90 capsule 1  . ibuprofen (ADVIL,MOTRIN) 800 MG tablet TAKE 1 TABLET BY MOUTH EVERY 8 HOURS AS NEEDED 180 tablet 1  . NARCAN 4 MG/0.1ML LIQD nasal spray kit PLACE 1 SPRAY INTO THE NOSE ONCE FOR 1 DOSE  2  . HYDROcodone-acetaminophen (NORCO) 10-325 MG tablet Take 1 tablet by mouth every 4 (four) hours as needed (for pain). 90 tablet 0   No facility-administered medications prior to visit.     ROS Review of Systems  Constitutional: Positive for unexpected weight change (wt gain). Negative for appetite change, diaphoresis and fatigue.  HENT: Negative.   Respiratory: Negative.  Negative for cough, chest tightness, shortness of breath and wheezing.   Cardiovascular: Negative for chest pain, palpitations and  leg swelling.  Gastrointestinal: Negative.  Negative for abdominal pain, constipation, diarrhea, nausea and vomiting.  Endocrine: Negative.   Genitourinary: Negative.  Negative for difficulty urinating, frequency and urgency.  Musculoskeletal: Positive for arthralgias and neck pain. Negative for back pain and myalgias.  Neurological: Negative.  Negative for dizziness, weakness, light-headedness, numbness and headaches.  Hematological: Negative for adenopathy. Does not bruise/bleed easily.  Psychiatric/Behavioral: Negative.     Objective:  BP 138/80 (BP Location: Left Arm, Patient Position: Sitting, Cuff Size: Normal)   Pulse 68   Temp 97.7 F (36.5 C) (Oral)   Resp 16   Ht 5' 6.25" (1.683 m)   Wt 201 lb (91.2 kg)   SpO2 96%   BMI 32.20 kg/m   BP Readings from Last 3 Encounters:  07/30/18 138/80  07/11/18 130/82  04/02/18 130/78    Wt Readings from Last 3 Encounters:  07/30/18 201 lb (91.2 kg)  07/11/18 198 lb (89.8 kg)  04/02/18 206 lb 8 oz (93.7 kg)    Physical Exam Vitals signs reviewed.  Constitutional:      Appearance: She is obese. She is not ill-appearing.  HENT:     Nose: Nose normal. No congestion.     Mouth/Throat:     Pharynx: Oropharynx is clear. No oropharyngeal exudate or posterior oropharyngeal erythema.  Eyes:     General: No scleral icterus.    Conjunctiva/sclera: Conjunctivae normal.  Neck:  Musculoskeletal: Normal range of motion and neck supple. No muscular tenderness.  Cardiovascular:     Rate and Rhythm: Normal rate and regular rhythm.     Heart sounds: No murmur. No gallop.   Pulmonary:     Effort: Pulmonary effort is normal.     Breath sounds: Normal breath sounds. No stridor. No wheezing, rhonchi or rales.  Abdominal:     General: Bowel sounds are normal.     Palpations: There is no mass.     Tenderness: There is no abdominal tenderness. There is no guarding.     Hernia: No hernia is present.  Musculoskeletal: Normal range of  motion.        General: No swelling or tenderness.     Right lower leg: No edema.     Left lower leg: No edema.  Lymphadenopathy:     Cervical: No cervical adenopathy.  Skin:    General: Skin is dry.     Coloration: Skin is not jaundiced.     Findings: No erythema.  Neurological:     General: No focal deficit present.     Mental Status: She is oriented to person, place, and time. Mental status is at baseline.  Psychiatric:        Mood and Affect: Mood normal.        Behavior: Behavior normal.        Thought Content: Thought content normal.        Judgment: Judgment normal.     Lab Results  Component Value Date   WBC 5.4 07/30/2018   HGB 14.7 07/30/2018   HCT 43.8 07/30/2018   PLT 266.0 07/30/2018   GLUCOSE 96 07/30/2018   CHOL 180 07/30/2018   TRIG 121.0 07/30/2018   HDL 46.20 07/30/2018   LDLDIRECT 142.0 01/21/2015   LDLCALC 110 (H) 07/30/2018   ALT 15 07/30/2018   AST 15 07/30/2018   NA 140 07/30/2018   K 4.2 07/30/2018   CL 106 07/30/2018   CREATININE 0.70 07/30/2018   BUN 10 07/30/2018   CO2 27 07/30/2018   TSH 2.05 07/30/2018   INR 0.89 06/08/2016   HGBA1C 6.0 07/30/2018    Ct Chest Lung Ca Screen Low Dose W/o Cm  Result Date: 07/12/2018 CLINICAL DATA:  63 year old female former smoker (quit in 2017) with 33 pack-year history of smoking. Lung cancer screening examination. EXAM: CT CHEST WITHOUT CONTRAST LOW-DOSE FOR LUNG CANCER SCREENING TECHNIQUE: Multidetector CT imaging of the chest was performed following the standard protocol without IV contrast. COMPARISON:  Chest CT 04/21/2016. FINDINGS: Cardiovascular: Heart size is normal. There is no significant pericardial fluid, thickening or pericardial calcification. Aortic atherosclerosis. No definite coronary artery calcification. Calcifications of the aortic valve and mitral annulus. Mediastinum/Nodes: No pathologically enlarged mediastinal or hilar lymph nodes. Please note that accurate exclusion of hilar  adenopathy is limited on noncontrast CT scans. Esophagus is unremarkable in appearance. No axillary lymphadenopathy. Lungs/Pleura: No suspicious appearing pulmonary nodules or masses are noted. No acute consolidative airspace disease. No pleural effusions. Mild diffuse bronchial wall thickening with very mild centrilobular and paraseptal emphysema. Linear areas of scarring are noted in the anterior right upper lobe and in the lung bases. Upper Abdomen: Aortic atherosclerosis. Musculoskeletal: There are no aggressive appearing lytic or blastic lesions noted in the visualized portions of the skeleton. IMPRESSION: 1. Lung-RADS 1, negative. Continue annual screening with low-dose chest CT without contrast in 12 months. 2. Aortic atherosclerosis. 3. Mild diffuse bronchial wall thickening with very mild centrilobular and  paraseptal emphysema; imaging findings suggestive of underlying COPD. 4. There are calcifications of the aortic valve and mitral annulus. Echocardiographic correlation for evaluation of potential valvular dysfunction may be warranted if clinically indicated. Aortic Atherosclerosis (ICD10-I70.0) and Emphysema (ICD10-J43.9). Electronically Signed   By: [name] M.D.   On: 07/12/2018 08:06    Assessment & Plan:   Ryiah was seen today for hypertension and hyperlipidemia.  Diagnoses and all orders for this visit:  Essential hypertension- Her blood pressure is adequately well controlled.  Medical therapy is not indicated. -     CBC with Differential/Platelet; Future -     Comprehensive metabolic panel; Future -     TSH; Future -     Urinalysis, Routine w reflex microscopic; Future  Hyperlipidemia with target LDL less than 100- She has a low ASCVD risk score so I do not recommend a statin for CV risk reduction. -     Lipid panel; Future -     TSH; Future  Encounter for long-term opiate analgesic use- She will undergo UDS to screen for compliance and to rule out substance abuse. -      Pain Mgmt, Profile 8 w/Conf, U; Future  Prediabetes - With lifestyle modifications she has lowered her A1c to 6.0%.  Medical therapy is not indicated. -     Hemoglobin A1c; Future  Encounter for screening for HIV -     HIV Antibody (routine testing w rflx); Future  DDD (degenerative disc disease), cervical -     HYDROcodone-acetaminophen (NORCO) 10-325 MG tablet; Take 1 tablet by mouth every 4 (four) hours as needed (for pain).  Primary osteoarthritis involving multiple joints -     HYDROcodone-acetaminophen (NORCO) 10-325 MG tablet; Take 1 tablet by mouth every 4 (four) hours as needed (for pain).   I am having Zo [place]. [place] maintain her aspirin EC, albuterol, NARCAN, ALPRAZolam, esomeprazole, albuterol, Clobetasol Prop Emollient Base, ibuprofen, and HYDROcodone-acetaminophen.  Meds ordered this encounter  Medications  . HYDROcodone-acetaminophen (NORCO) 10-325 MG tablet    Sig: Take 1 tablet by mouth every 4 (four) hours as needed (for pain).    Dispense:  90 tablet    Refill:  0     Follow-up: Return in about 6 months (around 01/28/2019).  Scarlette Calico, MD

## 2018-07-30 NOTE — Patient Instructions (Signed)

## 2018-07-31 LAB — HIV ANTIBODY (ROUTINE TESTING W REFLEX): HIV 1&2 Ab, 4th Generation: NONREACTIVE

## 2018-08-02 LAB — PAIN MGMT, PROFILE 8 W/CONF, U
6 Acetylmorphine: NEGATIVE ng/mL (ref <10)
AMINOCLONAZEPAM: NEGATIVE ng/mL (ref <25)
Alcohol Metabolites: NEGATIVE ng/mL (ref <500)
Alphahydroxyalprazolam: 80 ng/mL — ABNORMAL HIGH (ref <25)
Alphahydroxymidazolam: NEGATIVE ng/mL (ref <50)
Alphahydroxytriazolam: NEGATIVE ng/mL (ref <50)
Amphetamines: NEGATIVE ng/mL (ref <500)
Benzodiazepines: POSITIVE ng/mL — AB (ref <100)
Buprenorphine, Urine: NEGATIVE ng/mL (ref <5)
Cocaine Metabolite: NEGATIVE ng/mL (ref <150)
Codeine: NEGATIVE ng/mL (ref <50)
Creatinine: 15.9 mg/dL — ABNORMAL LOW
Hydrocodone: NEGATIVE ng/mL (ref <50)
Hydromorphone: NEGATIVE ng/mL (ref <50)
Hydroxyethylflurazepam: NEGATIVE ng/mL (ref <50)
Lorazepam: NEGATIVE ng/mL (ref <50)
MDMA: NEGATIVE ng/mL (ref <500)
Marijuana Metabolite: 137 ng/mL — ABNORMAL HIGH (ref <5)
Marijuana Metabolite: POSITIVE ng/mL — AB (ref <20)
Morphine: NEGATIVE ng/mL (ref <50)
NORDIAZEPAM: NEGATIVE ng/mL (ref <50)
Norhydrocodone: 101 ng/mL — ABNORMAL HIGH (ref <50)
OXIDANT: NEGATIVE ug/mL (ref <200)
Opiates: POSITIVE ng/mL — AB (ref <100)
Oxazepam: NEGATIVE ng/mL (ref <50)
Oxycodone: NEGATIVE ng/mL (ref <100)
SPECIFIC GRAVITY: 1.003 (ref >=1.0)
Temazepam: NEGATIVE ng/mL (ref <50)
pH: 6.45 (ref 4.5–9.0)

## 2018-08-08 DIAGNOSIS — J449 Chronic obstructive pulmonary disease, unspecified: Secondary | ICD-10-CM | POA: Diagnosis not present

## 2018-08-09 ENCOUNTER — Encounter: Payer: Self-pay | Admitting: Internal Medicine

## 2018-08-14 ENCOUNTER — Emergency Department (HOSPITAL_COMMUNITY)
Admission: EM | Admit: 2018-08-14 | Discharge: 2018-08-14 | Disposition: A | Discharge status: Home / Self Care | Payer: Medicare Other | Attending: Emergency Medicine | Admitting: Emergency Medicine

## 2018-08-14 ENCOUNTER — Encounter (HOSPITAL_COMMUNITY): Payer: Self-pay

## 2018-08-14 DIAGNOSIS — J45909 Unspecified asthma, uncomplicated: Secondary | ICD-10-CM | POA: Diagnosis not present

## 2018-08-14 DIAGNOSIS — Z7982 Long term (current) use of aspirin: Secondary | ICD-10-CM | POA: Diagnosis not present

## 2018-08-14 DIAGNOSIS — Y998 Other external cause status: Secondary | ICD-10-CM | POA: Insufficient documentation

## 2018-08-14 DIAGNOSIS — Z79899 Other long term (current) drug therapy: Secondary | ICD-10-CM | POA: Diagnosis not present

## 2018-08-14 DIAGNOSIS — Z87891 Personal history of nicotine dependence: Secondary | ICD-10-CM | POA: Diagnosis not present

## 2018-08-14 DIAGNOSIS — S0101XA Laceration without foreign body of scalp, initial encounter: Secondary | ICD-10-CM | POA: Diagnosis not present

## 2018-08-14 DIAGNOSIS — Y9389 Activity, other specified: Secondary | ICD-10-CM | POA: Insufficient documentation

## 2018-08-14 DIAGNOSIS — J449 Chronic obstructive pulmonary disease, unspecified: Secondary | ICD-10-CM | POA: Diagnosis not present

## 2018-08-14 DIAGNOSIS — Y92093 Driveway of other non-institutional residence as the place of occurrence of the external cause: Secondary | ICD-10-CM | POA: Insufficient documentation

## 2018-08-14 MED ORDER — LIDOCAINE HCL (PF) 1 % IJ SOLN
5.0000 mL | Freq: Once | INTRAMUSCULAR | Status: AC
  Administered 2018-08-14: 5 mL
  Filled 2018-08-14: qty 30

## 2018-08-14 NOTE — ED Notes (Signed)
Bed: WA06 Expected date:  Expected time:  Means of arrival:  Comments: 
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
## 2018-08-14 NOTE — Discharge Instructions (Addendum)
Suture removal in 7-10 days 
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
## 2018-08-14 NOTE — ED Notes (Signed)
Laceration cleaned. Bandage applied. 72mm long.

## 2018-08-21 ENCOUNTER — Telehealth: Payer: Self-pay | Admitting: Internal Medicine

## 2018-08-21 NOTE — Telephone Encounter (Signed)
Pt states she is blowing her nose-yellow in color, cough-non productive, chills/fever, and body aches. Please advise.   Pt does not take flu shot each year.
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
## 2018-08-21 NOTE — Telephone Encounter (Signed)
Patient has not felt good x3 days and has been taking "vicks vapor syrup" and robutussin. Patient stated that she would rather have something call in.

## 2018-08-21 NOTE — Telephone Encounter (Signed)
Per TP: could be the flu, she needs to be seen and tested for the flu.  Recommend she come in to the office tomorrow.  In the meantime, may take Mucinex DM twice daily as needed for cough/congestion, saline nasal spray as needed, plenty of fluids and lots of rest.  Thanks.
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
## 2018-08-22 MED ORDER — AZITHROMYCIN 250 MG PO TABS: ORAL_TABLET | ORAL | 0 refills | Status: DC
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
## 2018-08-22 NOTE — Telephone Encounter (Signed)
Call made to patient, made aware CY said it was okay to send in Moraine. Patient was very thankful. Confirmed pharmacy. Order sent. Nothing further is needed at this time.

## 2018-08-26 ENCOUNTER — Ambulatory Visit (HOSPITAL_COMMUNITY)
Admission: EM | Admit: 2018-08-26 | Discharge: 2018-08-26 | Disposition: A | Discharge status: Home / Self Care | Payer: Medicare Other

## 2018-08-26 DIAGNOSIS — Z4802 Encounter for removal of sutures: Secondary | ICD-10-CM

## 2018-08-26 NOTE — ED Triage Notes (Signed)
Pt here for 15 sutures removed from forehead placed on 1/23. Wound well healed. Sutures removed without issue.

## 2018-08-27 ENCOUNTER — Other Ambulatory Visit: Payer: Self-pay | Admitting: Internal Medicine

## 2018-08-27 ENCOUNTER — Telehealth: Payer: Self-pay | Admitting: Internal Medicine

## 2018-08-27 DIAGNOSIS — M159 Polyosteoarthritis, unspecified: Secondary | ICD-10-CM

## 2018-08-27 DIAGNOSIS — M150 Primary generalized (osteo)arthritis: Secondary | ICD-10-CM

## 2018-08-27 DIAGNOSIS — M5030 Other cervical disc degeneration, unspecified cervical region: Secondary | ICD-10-CM

## 2018-08-27 MED ORDER — HYDROCODONE-ACETAMINOPHEN 10-325 MG PO TABS: 1.0000 | ORAL_TABLET | ORAL | 0 refills | Status: DC | PRN

## 2018-08-27 NOTE — Telephone Encounter (Signed)
Copied from [place] [id number]. Topic: Quick Communication - Rx Refill/Question >> Aug 27, 2018  1:02 PM Margot Ables wrote: Medication: HYDROcodone-acetaminophen (NORCO) 10-325 MG tablet - pt will be out today or tomorrow - she states he reduced her to #90 Last fill #90 07/30/2018 Last OV 07/30/2018  Has the patient contacted their pharmacy? No - controlled Preferred Pharmacy (with phone number or street name): [id number] [address], [place]. [phone] (Phone) [phone] (Fax)

## 2018-08-28 NOTE — Telephone Encounter (Signed)
MD approved and sent to pof../lmb 

## 2018-08-30 DIAGNOSIS — J449 Chronic obstructive pulmonary disease, unspecified: Secondary | ICD-10-CM | POA: Diagnosis not present

## 2018-09-17 ENCOUNTER — Other Ambulatory Visit: Payer: Self-pay | Admitting: Internal Medicine

## 2018-09-17 DIAGNOSIS — M159 Polyosteoarthritis, unspecified: Secondary | ICD-10-CM

## 2018-09-17 DIAGNOSIS — M150 Primary generalized (osteo)arthritis: Secondary | ICD-10-CM

## 2018-09-17 DIAGNOSIS — M5030 Other cervical disc degeneration, unspecified cervical region: Secondary | ICD-10-CM

## 2018-09-17 NOTE — Telephone Encounter (Signed)
Requested medication (s) are due for refill today: yes  Requested medication (s) are on the active medication list: yes    Last refill: 08/27/2018  #90  0 refills  Future visit scheduled yes 01/29/2019  Dr. Ronnald Ramp  Notes to clinic:Not delegated  Requested Prescriptions  Pending Prescriptions Disp Refills   HYDROcodone-acetaminophen (NORCO) 10-325 MG tablet 90 tablet 0    Sig: Take 1 tablet by mouth every 4 (four) hours as needed (for pain).     Not Delegated - Analgesics:  Opioid Agonist Combinations Failed - 09/17/2018  2:47 PM      Failed - This refill cannot be delegated      Passed - Urine Drug Screen completed in last 360 days.      Passed - Valid encounter within last 6 months    Recent Outpatient Visits          1 month ago Essential hypertension   [place], Thomas L, MD   5 months ago Essential hypertension   [place], MD   9 months ago Eczema, unspecified type   [place], Thomas L, MD   1 year ago Essential hypertension   [place], Thomas L, MD   1 year ago Essential hypertension   [place], MD      Future Appointments            In 1 week Deneise Lever, MD [facility]   In 4 months [name], MD [place]

## 2018-09-17 NOTE — Telephone Encounter (Signed)
Copied from [place] [id number]. Topic: Quick Communication - Rx Refill/Question >> Sep 17, 2018  1:57 PM Scherrie Gerlach wrote: Medication: HYDROcodone-acetaminophen (NORCO) 10-325 MG tablet  Pt states she is calling early because the dr advised her to.  She knows it is not due until 02/03,/20 [id number] [place], [place]. [phone] (Phone) [phone] (Fax)

## 2018-09-20 ENCOUNTER — Telehealth: Payer: Self-pay | Admitting: Internal Medicine

## 2018-09-20 NOTE — Telephone Encounter (Signed)
Copied from [place] [id number]. Topic: Quick Communication - Rx Refill/Question >> Sep 20, 2018  5:09 PM Alanda Slim E wrote: Medication: HYDROcodone-acetaminophen (NORCO) 10-325 MG tablet  Has the patient contacted their pharmacy? No   Preferred Pharmacy (with phone number or street name): [id number] [address], [place]. [phone] (Phone) [phone] (Fax)    Agent: Please be advised that RX refills may take up to 3 business days. We ask that you follow-up with your pharmacy.

## 2018-09-21 MED ORDER — HYDROCODONE-ACETAMINOPHEN 10-325 MG PO TABS: 1.0000 | ORAL_TABLET | ORAL | 0 refills | Status: DC | PRN

## 2018-09-21 NOTE — Telephone Encounter (Signed)
There is already a request sent to the provider. I am closing this request.

## 2018-09-24 ENCOUNTER — Ambulatory Visit (INDEPENDENT_AMBULATORY_CARE_PROVIDER_SITE_OTHER): Payer: Medicare Other | Admitting: Internal Medicine

## 2018-09-24 ENCOUNTER — Encounter: Payer: Self-pay | Admitting: Internal Medicine

## 2018-09-24 DIAGNOSIS — J4520 Mild intermittent asthma, uncomplicated: Secondary | ICD-10-CM

## 2018-09-24 DIAGNOSIS — F410 Panic disorder [episodic paroxysmal anxiety] without agoraphobia: Secondary | ICD-10-CM | POA: Diagnosis not present

## 2018-09-24 MED ORDER — ALPRAZOLAM 1 MG PO TABS: 1.0000 mg | ORAL_TABLET | Freq: Four times a day (QID) | ORAL | 5 refills | Status: DC | PRN

## 2018-09-24 NOTE — Assessment & Plan Note (Signed)
Resolved most recent exacerbation which was probably due to viral URI. Plan refill present inhalers if needed. Watch need for maintenance controller.

## 2018-09-24 NOTE — Assessment & Plan Note (Signed)
Discussed use of alprazolam. Originally this helped mitigate asthma-like episodes triggered by anxiety. She has been much more stable in recent years. No concern for diversion or mis-use, but safety talk renewed.

## 2018-09-24 NOTE — Patient Instructions (Signed)
Refill script printed for alprazolam- use sparingly  Please call if we can help
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
## 2018-09-25 DIAGNOSIS — J449 Chronic obstructive pulmonary disease, unspecified: Secondary | ICD-10-CM | POA: Diagnosis not present

## 2018-10-01 ENCOUNTER — Ambulatory Visit (INDEPENDENT_AMBULATORY_CARE_PROVIDER_SITE_OTHER)
Admission: RE | Admit: 2018-10-01 | Discharge: 2018-10-01 | Disposition: A | Discharge status: Home / Self Care | Payer: Medicare Other | Source: Ambulatory Visit | Attending: Pulmonary Disease | Admitting: Pulmonary Disease

## 2018-10-01 ENCOUNTER — Ambulatory Visit (INDEPENDENT_AMBULATORY_CARE_PROVIDER_SITE_OTHER): Payer: Medicare Other | Admitting: Pulmonary Disease

## 2018-10-01 ENCOUNTER — Telehealth: Payer: Self-pay | Admitting: Internal Medicine

## 2018-10-01 ENCOUNTER — Encounter: Payer: Self-pay | Admitting: Pulmonary Disease

## 2018-10-01 VITALS — BP 130/80 | HR 96 | Temp 98.4°F | Ht 66.5 in | Wt 203.8 lb

## 2018-10-01 DIAGNOSIS — F410 Panic disorder [episodic paroxysmal anxiety] without agoraphobia: Secondary | ICD-10-CM

## 2018-10-01 DIAGNOSIS — J4521 Mild intermittent asthma with (acute) exacerbation: Secondary | ICD-10-CM

## 2018-10-01 DIAGNOSIS — Z87891 Personal history of nicotine dependence: Secondary | ICD-10-CM | POA: Diagnosis not present

## 2018-10-01 DIAGNOSIS — R0602 Shortness of breath: Secondary | ICD-10-CM | POA: Diagnosis not present

## 2018-10-01 DIAGNOSIS — R05 Cough: Secondary | ICD-10-CM | POA: Diagnosis not present

## 2018-10-01 DIAGNOSIS — J309 Allergic rhinitis, unspecified: Secondary | ICD-10-CM

## 2018-10-01 HISTORY — DX: Personal history of nicotine dependence: Z87.891

## 2018-10-01 MED ORDER — AZITHROMYCIN 250 MG PO TABS: ORAL_TABLET | ORAL | 0 refills | Status: DC

## 2018-10-01 MED ORDER — PREDNISONE 10 MG PO TABS: ORAL_TABLET | ORAL | 0 refills | Status: DC

## 2018-10-01 NOTE — Assessment & Plan Note (Signed)
Assessment: Typically not maintained on a maintenance inhaler, typically uses her rescue only 1-2 times daily for management of her breathing Patient reports increased wheezing, productive cough with yellow mucus since 09/27/2018 Inspiratory and expiratory wheezes on exam today Patient has had increased [place] use over the last 2 to 3 days  Plan: Prednisone taper today Chest x-ray today Z-Pak today May change antibiotic based off of chest x-ray results Keep scheduled follow-up with Dr. Annamaria Boots Rescue inhaler use as well as neb use as needed

## 2018-10-01 NOTE — Assessment & Plan Note (Signed)
Assessment: Lung RADS 1 on December/2019 lung cancer screening CT 36-pack-year smoker  Plan: Continue forward with December/2020 lung cancer screening CT  continue to not smoke

## 2018-10-01 NOTE — Progress Notes (Signed)
@Patient  ID: Felicia Odom, female    DOB: 06-11-1956, 63 y.o.   MRN: [id number]  Chief Complaint  Patient presents with  . Acute Visit    congested cough / SOB x 4 days - no travel    Referring provider: [name], MD  HPI:  63 year old female former smoker followed in our office for COPD  PMH:  Thymoma/ TSGY, OCD/panic, anxiety, GERD Smoker/ Smoking History: Former smoker.  Quit 2017.  36-pack-year smoking history.  Currently in lung cancer screening program last CT 07/12/18 reading lung RADS 1 Maintenance:  None Pt of: Dr. Annamaria Boots  10/01/2018  - Visit   63 year old female former smoker followed in our office for asthma.  Patient is not currently maintained on any sort of maintenance inhalers and reports that when she is doing well she typically just uses her pro-air rescue inhaler 1-2 times daily and this is been managing her breathing.  Patient recently completed a lung cancer screening CT in December/2019 that was a lung RADS 1.  Patient was previously seen in our office on 09/24/2018 and was stable with no acute symptoms.  Patient reports that she then went to [facility] and on 09/27/2018 she started develop increased shortness of breath, increased fatigue, increased productive cough with yellow mucus, she is had to use her rescue inhaler more so recently.  Patient also endorses increased wheezing.  Patient reports she had to use her rescue inhaler 4 times yesterday and is already had to use it 2 times today.  Patient denies other sick contacts besides going to [place].  MMRC - Breathlessness Score 3 - I stop for breath after walking about 100 yards or after a few minutes on level ground (isle at grocery store is 154f)   Tests:   Office spirometry- 01/29/15 Normal spirometry. FVC 2.74/81%, FEV1 2.11/79%, FEV1/FVC 0.77, FEF 25-75 percent 1.79 Office Spirometry 10/21/2015-slight restriction of exhaled volume, mild obstruction. FVC 2.69/78%, FEV1 2.04/76%, FEV1/FVC 0.76, FEF  25-75 percent 1.68/65% PFT 04/27/2016-minimal obstructive airways disease, minimal diffusion defect, insignificant response to bronchodilator. FVC 2.79/78%, FEV1 2.11/76%, ratio 0.76, FEF 25-75 percent 1.81/72%, TLC 92%, DLCO 76%.  FENO:  No results found for: NITRICOXIDE  PFT: PFT Results Latest Ref Rng & Units 04/27/2016  FVC-Pre L 2.70  FVC-Predicted Pre % 75  FVC-Post L 2.79  FVC-Predicted Post % 78  Pre FEV1/FVC % % 74  Post FEV1/FCV % % 76  FEV1-Pre L 2.00  FEV1-Predicted Pre % 72  FEV1-Post L 2.11  DLCO UNC% % 76  DLCO COR %Predicted % 104  TLC L 4.97  TLC % Predicted % 92  RV % Predicted % 107    Imaging: No results found.    Specialty Problems      Pulmonary Problems   Allergic rhinitis    Qualifier: Diagnosis of  By: YAnnamaria BootsMD, Clinton D       Mild intermittent asthma without complication      Allergies  Allergen Reactions  . No Known Allergies     Immunization History  Administered Date(s) Administered  . Influenza-Unspecified 03/26/2015  . Tdap 09/23/2015  . Zoster Recombinat (Shingrix) 03/27/2018, 06/08/2018    Past Medical History:  Diagnosis Date  . Arthritis    neck  . Asthma   . Chronic back pain   . [place] bronchitis [place]   . Chronic neck pain   . COPD (chronic obstructive pulmonary disease) (H[facility])    Advair and Albuterol as needed as well as neb  .  GERD (gastroesophageal reflux disease)    takes Nexium daily  . History of blood transfusion    in the 70's. No abnormal reaction noted  . History of colon polyps    benign  . Hyperlipidemia    not on any meds bc refused to pick up from pharmacy  . Joint pain   . Joint swelling   . Mediastinal tumor   . Nocturia   . OCD (obsessive compulsive disorder)   . Panic disorder    takes Xanax daily as needed  . Pneumonia    2016  . PONV (postoperative nausea and vomiting)   . Seizures ([place])    in the 80's    Tobacco History: Social History   Tobacco Use  Smoking Status  Former Smoker  . Packs/day: 1.00  . Years: 36.00  . Pack years: 36.00  . Types: Cigarettes  . Last attempt to quit: 2017  . Years since quitting: 3.1  Smokeless Tobacco Never Used  Tobacco Comment   last cigarette 2017   Counseling given: Yes Comment: last cigarette 2017  Continue to not smoke  Continue follow-up with lung cancer screening program  Outpatient Encounter Medications as of 10/01/2018  Medication Sig  . albuterol (PROAIR HFA) 108 (90 Base) MCG/ACT inhaler INHALE 1 TO 2 PUFFS INTO THE LUNGS EVERY 4 HOURS AS NEEDED FOR WHEEZING OR SHORTNESS OF BREATH  . albuterol (PROVENTIL) (2.5 MG/3ML) 0.083% nebulizer solution Take 3 mLs (2.5 mg total) by nebulization every 6 (six) hours. Dx COPD with bronchitis  . ALPRAZolam (XANAX) 1 MG tablet Take 1 tablet (1 mg total) by mouth every 6 (six) hours as needed.  Marland Kitchen aspirin EC 81 MG tablet Take 81 mg by mouth daily.  . Clobetasol Prop Emollient Base 0.05 % emollient cream APPLY TO AFFECTED AREA TWICE A DAY  . esomeprazole (NEXIUM) 40 MG capsule Take 1 capsule (40 mg total) by mouth daily.  Marland Kitchen HYDROcodone-acetaminophen (NORCO) 10-325 MG tablet Take 1 tablet by mouth every 4 (four) hours as needed (for pain).  Marland Kitchen ibuprofen (ADVIL,MOTRIN) 800 MG tablet TAKE 1 TABLET BY MOUTH EVERY 8 HOURS AS NEEDED  . NARCAN 4 MG/0.1ML LIQD nasal spray kit PLACE 1 SPRAY INTO THE NOSE ONCE FOR 1 DOSE  . [DISCONTINUED] azithromycin (ZITHROMAX) 250 MG tablet Take 2 tablets today, then 1 tablet daily until gone.  Marland Kitchen azithromycin (ZITHROMAX) 250 MG tablet 528m (two tablets) today, then 2553m(1 tablet) for the next 4 days  . predniSONE (DELTASONE) 10 MG tablet 4 tabs for 2 days, then 3 tabs for 2 days, 2 tabs for 2 days, then 1 tab for 2 days, then stop  . [DISCONTINUED] progesterone (PROMETRIUM) 100 MG capsule Take 100 mg by mouth daily.     No facility-administered encounter medications on file as of 10/01/2018.      Review of Systems  Review of Systems    Constitutional: Positive for fatigue. Negative for chills, fever and unexpected weight change.  HENT: Positive for congestion and postnasal drip. Negative for ear pain, sinus pressure and sinus pain.   Respiratory: Positive for cough (productive cough - yellow mucous), chest tightness, shortness of breath and wheezing.   Cardiovascular: Negative for chest pain and palpitations.  Gastrointestinal: Negative for diarrhea, nausea and vomiting.  Musculoskeletal: Negative for arthralgias.  Skin: Negative for color change.  Allergic/Immunologic: Negative for environmental allergies and food allergies.  Neurological: Negative for dizziness, light-headedness and headaches.  Psychiatric/Behavioral: Negative for dysphoric mood. The patient is not nervous/anxious.  All other systems reviewed and are negative.    Physical Exam  BP 130/80 (BP Location: Left Arm, Patient Position: Sitting, Cuff Size: Normal)   Pulse 96   Temp 98.4 F (36.9 C)   Ht 5' 6.5" (1.689 m)   Wt 203 lb 12.8 oz (92.4 kg)   SpO2 95%   BMI 32.40 kg/m   Wt Readings from Last 5 Encounters:  10/01/18 203 lb 12.8 oz (92.4 kg)  09/24/18 205 lb 12.8 oz (93.4 kg)  08/14/18 198 lb (89.8 kg)  07/30/18 201 lb (91.2 kg)  07/11/18 198 lb (89.8 kg)     Physical Exam  Constitutional: She is oriented to person, place, and time and well-developed, well-nourished, and in no distress. No distress.  HENT:  Head: Normocephalic and atraumatic.  Right Ear: Hearing, external ear and ear canal normal.  Left Ear: Hearing, external ear and ear canal normal.  Nose: Rhinorrhea present. Right sinus exhibits no maxillary sinus tenderness and no frontal sinus tenderness. Left sinus exhibits no maxillary sinus tenderness and no frontal sinus tenderness.  Mouth/Throat: Uvula is midline and oropharynx is clear and moist. No oropharyngeal exudate.  TMs with effusion without infection bilaterally Postnasal drip  Eyes: Pupils are equal, round, and  reactive to light.  Neck: Normal range of motion. Neck supple.  Cardiovascular: Normal rate, regular rhythm and normal heart sounds.  Pulmonary/Chest: Effort normal. No accessory muscle usage. No respiratory distress. She has no decreased breath sounds. She has wheezes (Inspiratory and expiratory wheezes today). She has no rhonchi. She has no rales.  Abdominal: Soft. Bowel sounds are normal. There is no abdominal tenderness.  Musculoskeletal: Normal range of motion.        General: No edema.  Lymphadenopathy:    She has no cervical adenopathy.  Neurological: She is alert and oriented to person, place, and time. Gait normal.  Skin: Skin is warm and dry. She is not diaphoretic. No erythema.  Psychiatric: Memory, affect and judgment normal. Her mood appears anxious.  Nursing note and vitals reviewed.     Lab Results:  CBC    Component Value Date/Time   WBC 5.4 07/30/2018 0853   RBC 5.13 (H) 07/30/2018 0853   HGB 14.7 07/30/2018 0853   HCT 43.8 07/30/2018 0853   PLT 266.0 07/30/2018 0853   MCV 85.4 07/30/2018 0853   MCH 28.1 06/12/2016 0343   MCHC 33.6 07/30/2018 0853   RDW 14.3 07/30/2018 0853   LYMPHSABS 1.6 07/30/2018 0853   MONOABS 0.5 07/30/2018 0853   EOSABS 0.1 07/30/2018 0853   BASOSABS 0.1 07/30/2018 0853    BMET    Component Value Date/Time   NA 140 07/30/2018 0853   NA 142 03/16/2018   K 4.2 07/30/2018 0853   CL 106 07/30/2018 0853   CO2 27 07/30/2018 0853   GLUCOSE 96 07/30/2018 0853   BUN 10 07/30/2018 0853   BUN 9 03/16/2018   CREATININE 0.70 07/30/2018 0853   CALCIUM 9.3 07/30/2018 0853   GFRNONAA >60 06/12/2016 0343   GFRAA >60 06/12/2016 0343    BNP No results found for: BNP  ProBNP No results found for: PROBNP    Assessment & Plan:     Mild intermittent asthma without complication Assessment: Typically not maintained on a maintenance inhaler, typically uses her rescue only 1-2 times daily for management of her breathing Patient reports  increased wheezing, productive cough with yellow mucus since 09/27/2018 Inspiratory and expiratory wheezes on exam today Patient has had increased [place] use  over the last 2 to 3 days  Plan: Prednisone taper today Chest x-ray today Z-Pak today May change antibiotic based off of chest x-ray results Keep scheduled follow-up with Dr. Annamaria Boots Rescue inhaler use as well as neb use as needed  Allergic rhinitis Assessment: Mild AR flare on exam today  Plan: Prednisone taper today Could consider nasal saline rinses in the future Patient is not interested in nasal spray use or daily antihistamines at this time  PANIC DISORDER Assessment: This is a stable interval for the patient Patient recently had alprazolam refilled by Dr. Annamaria Boots  Plan: Discussed with patient that prednisone could increase her panic as well as anxiety If she notices the symptoms she needs to follow-up with primary care our office as well as decrease the prednisone dose Patient reports she is taken prednisone in the past without issue Patient reports that she will complete this follow-up if needed  Former smoker Assessment: Lung RADS 1 on December/2019 lung cancer screening CT 36-pack-year smoker  Plan: Continue forward with December/2020 lung cancer screening CT  continue to not smoke     Lauraine Rinne, NP 10/01/2018   This appointment was 26 min long with over 50% of the time in direct face-to-face patient care, assessment, plan of care, and follow-up.

## 2018-10-01 NOTE — Assessment & Plan Note (Signed)
Assessment: This is a stable interval for the patient Patient recently had alprazolam refilled by Dr. Annamaria Boots  Plan: Discussed with patient that prednisone could increase her panic as well as anxiety If she notices the symptoms she needs to follow-up with primary care our office as well as decrease the prednisone dose Patient reports she is taken prednisone in the past without issue Patient reports that she will complete this follow-up if needed

## 2018-10-01 NOTE — Progress Notes (Signed)
Your chest x-ray results of come back.  Showing no acute changes.  No plan of care changes at this time.  Keep follow-up appointment.    Follow-up with our office if symptoms worsen or you do not feel like you are improving under her current regimen.  It was a pleasure taking care of you,  Brian Mack, FNP 

## 2018-10-01 NOTE — Patient Instructions (Addendum)
Chest Xray today   Prednisone 10mg  tablet  >>>4 tabs for 2 days, then 3 tabs for 2 days, 2 tabs for 2 days, then 1 tab for 2 days, then stop >>>take with food  >>>take in the morning  >>> This can worsen anxiety as well as your panic attacks, if you notice that you are having those symptoms and you feel your anxiety is worsen please decrease the prednisone and contact our office  Azithromycin 250mg  tablet  >>>Take 2 tablets (500mg  total) today, and then 1 tablet (250mg ) for the next four days  >>>take with food  >>>can also take probiotic and / or yogurt while on antibiotic   We may change his antibiotic based off of your chest x-ray results  Only use your albuterol as a rescue medication to be used if you can't catch your breath by resting or doing a relaxed purse lip breathing pattern.  - The less you use it, the better it will work when you need it. - Ok to use up to 2 puffs  every 4 hours if you must but call for immediate appointment if use goes up over your usual need - Don't leave home without it !!  (think of it like the spare tire for your car)   Can use nebulized treatments every 4-6 hours as needed for shortness of breath and wheezing  Keep planned follow-up in December/2020 for lung cancer screening CT   Keep scheduled follow-up with Dr. Annamaria Boots   It is flu season:   >>> Best ways to protect herself from the flu: Receive the yearly flu vaccine, practice good hand hygiene washing with soap and also using hand sanitizer when available, eat a nutritious meals, get adequate rest, hydrate appropriately   Please contact the office if your symptoms worsen or you have concerns that you are not improving.   Thank you for choosing [facility] for your healthcare, and for allowing [place] to partner with you on your healthcare journey. I am thankful to be able to provide care to you today.   Wyn Quaker FNP-C

## 2018-10-01 NOTE — Telephone Encounter (Signed)
Noted.  Look forward to seeing her.  Sorry she is not feeling well thanks for the FYI.Wyn Quaker, FNP

## 2018-10-01 NOTE — Assessment & Plan Note (Signed)
Assessment: Mild AR flare on exam today  Plan: Prednisone taper today Could consider nasal saline rinses in the future Patient is not interested in nasal spray use or daily antihistamines at this time

## 2018-10-01 NOTE — Telephone Encounter (Signed)
Called and spoke with patient, while on the phone with patient I could hear her wheezing. Patient sounded awful. She stated that she was terrible sick. Fever, runny nose, congestion, body aches, chills, cough. Denies recent travel. Patient has been scheduled with Wyn Quaker today. Patient is aware to wear a mask. Will route to [place] as an [place].

## 2018-10-05 ENCOUNTER — Other Ambulatory Visit: Payer: Self-pay | Admitting: Internal Medicine

## 2018-10-05 DIAGNOSIS — K219 Gastro-esophageal reflux disease without esophagitis: Secondary | ICD-10-CM

## 2018-10-08 ENCOUNTER — Telehealth: Payer: Self-pay | Admitting: Pulmonary Disease

## 2018-10-08 NOTE — Telephone Encounter (Signed)
Called the patient back and she has requested to get cough syrup with codeine in it that was given to her before by Dr. Annamaria Boots. Patient would like to get prescription cough medication since she cannot get rid of the cough she had prior to the appointment.  Patient completed taking the Z-pac and prednisone taper given during her 10/01/18 office visit. However the patient has since that time used two bottles of OTC Robitussin DM and Robaxin, which the patient said Robaxin was supposed to be the generic of Robitussin (it is a muscle relaxant).  Patient stated when she was seen on 10/01/18 she had a chest xray which was negative. Finished the zpac already. Took last prednisone this morning.  Patient does not have: Nasal congestion Fever SOB.  Patient does have: Hacking cough that is not always productive. When productive the chest congestion is clear and yellow. Has been having some wheezing, but patient acknowledged that could be because of her COPD, Emphysema.  Message routed to app of the afternoon for review.  Wyn Quaker, NP: Can you please take a look at this and advise if appropriate to send cough syrup requested. Thank you much.

## 2018-10-08 NOTE — Telephone Encounter (Signed)
Called the patient and advised the request for the narcotic/cough syrup was denied. Advised her of the recommendations made regarding OTC cough medicine. Advised her if it does not continue to improve to use cone E visit or call the office for an appointment.  Patient voiced understanding. Nothing further needed at this time.

## 2018-10-08 NOTE — Telephone Encounter (Signed)
Sorry to here the patient is having the symptoms.  Before prescribing the patient with narcotic/codeine cough syrup, I have checked [place] PMP aware and the patients overdose risk score is 480. Patient has 3 providers prescribing controlled substances. Patient has used 2 pharmacies.   Due to the patient's moderate overdose risk score as well as recent refills of alprazolam as well as hydrocodone-acetaminophen with a 30-day average of MME 44.35 I do not believe that narcotic cough syrup is necessary at this time.  I will not be prescribing this cough medicine at this time.  I would recommend the patient continue to use over-the-counter cough medicine for management of symptoms.  She was just recently treated with antibiotics and prednisone for an exacerbation.  If symptoms worsen she is more than welcome to utilize cones E visit option which is currently free for further evaluation or she can present to be seen at any medical office.  Wyn Quaker FNP

## 2018-10-15 ENCOUNTER — Other Ambulatory Visit: Payer: Self-pay | Admitting: Internal Medicine

## 2018-10-15 DIAGNOSIS — M5030 Other cervical disc degeneration, unspecified cervical region: Secondary | ICD-10-CM

## 2018-10-15 DIAGNOSIS — M150 Primary generalized (osteo)arthritis: Secondary | ICD-10-CM

## 2018-10-15 DIAGNOSIS — M159 Polyosteoarthritis, unspecified: Secondary | ICD-10-CM

## 2018-10-15 NOTE — Telephone Encounter (Signed)
Patient stated that she was told she can receive refill early and to request it.

## 2018-10-16 ENCOUNTER — Other Ambulatory Visit: Payer: Self-pay | Admitting: Internal Medicine

## 2018-10-16 DIAGNOSIS — J449 Chronic obstructive pulmonary disease, unspecified: Secondary | ICD-10-CM | POA: Diagnosis not present

## 2018-10-17 ENCOUNTER — Other Ambulatory Visit: Payer: Self-pay | Admitting: Internal Medicine

## 2018-10-18 ENCOUNTER — Other Ambulatory Visit: Payer: Self-pay | Admitting: Internal Medicine

## 2018-10-18 DIAGNOSIS — M5030 Other cervical disc degeneration, unspecified cervical region: Secondary | ICD-10-CM

## 2018-10-18 DIAGNOSIS — M159 Polyosteoarthritis, unspecified: Secondary | ICD-10-CM

## 2018-10-18 DIAGNOSIS — M150 Primary generalized (osteo)arthritis: Secondary | ICD-10-CM

## 2018-10-18 MED ORDER — HYDROCODONE-ACETAMINOPHEN 10-325 MG PO TABS: 1.0000 | ORAL_TABLET | ORAL | 0 refills | Status: DC | PRN

## 2018-11-07 DIAGNOSIS — J449 Chronic obstructive pulmonary disease, unspecified: Secondary | ICD-10-CM | POA: Diagnosis not present

## 2018-11-12 ENCOUNTER — Other Ambulatory Visit: Payer: Self-pay | Admitting: Internal Medicine

## 2018-11-12 DIAGNOSIS — M159 Polyosteoarthritis, unspecified: Secondary | ICD-10-CM

## 2018-11-12 DIAGNOSIS — M150 Primary generalized (osteo)arthritis: Secondary | ICD-10-CM

## 2018-11-12 DIAGNOSIS — M5030 Other cervical disc degeneration, unspecified cervical region: Secondary | ICD-10-CM

## 2018-11-12 NOTE — Telephone Encounter (Signed)
Per Database; hydrocodone last refilled on 10/18/2018.   LOV with PCP was 07/30/2018

## 2018-11-13 NOTE — Telephone Encounter (Signed)
Patient called to check status.

## 2018-11-15 ENCOUNTER — Other Ambulatory Visit: Payer: Self-pay | Admitting: Internal Medicine

## 2018-11-16 ENCOUNTER — Telehealth: Payer: Self-pay | Admitting: Internal Medicine

## 2018-11-16 NOTE — Telephone Encounter (Signed)
Copied from [place] [id number]. Topic: Quick Communication - Rx Refill/Question >> Nov 16, 2018  8:13 AM Parke Poisson wrote: Medication:HYDROcodone-acetaminophen [facility]) 10-325 MG tablet  Has the patient contacted their pharmacy?No--She states doctor wants her to contact office and she left message on recorder on Monday She will run out this weelend   Preferred Pharmacy (with phone number or street name): [id number] [address], [place]. [phone] (Phone) [phone] (Fax)    Agent: Please be advised that RX refills may take up to 3 business days. We ask that you follow-up with your pharmacy.

## 2018-11-16 NOTE — Telephone Encounter (Signed)
Control database checked last refill: 10/18/2018 LOV: 07/30/2018 NOV:01/29/2019

## 2018-11-17 ENCOUNTER — Other Ambulatory Visit: Payer: Self-pay | Admitting: Internal Medicine

## 2018-11-17 DIAGNOSIS — M150 Primary generalized (osteo)arthritis: Secondary | ICD-10-CM

## 2018-11-17 DIAGNOSIS — M5030 Other cervical disc degeneration, unspecified cervical region: Secondary | ICD-10-CM

## 2018-11-17 DIAGNOSIS — M159 Polyosteoarthritis, unspecified: Secondary | ICD-10-CM

## 2018-11-17 MED ORDER — HYDROCODONE-ACETAMINOPHEN 10-325 MG PO TABS: 1.0000 | ORAL_TABLET | ORAL | 0 refills | Status: DC | PRN

## 2018-11-21 ENCOUNTER — Other Ambulatory Visit: Payer: Self-pay | Admitting: Internal Medicine

## 2018-11-21 DIAGNOSIS — M5030 Other cervical disc degeneration, unspecified cervical region: Secondary | ICD-10-CM

## 2018-11-21 DIAGNOSIS — M159 Polyosteoarthritis, unspecified: Secondary | ICD-10-CM

## 2018-11-21 DIAGNOSIS — M150 Primary generalized (osteo)arthritis: Principal | ICD-10-CM

## 2018-11-29 DIAGNOSIS — J449 Chronic obstructive pulmonary disease, unspecified: Secondary | ICD-10-CM | POA: Diagnosis not present

## 2018-12-18 ENCOUNTER — Telehealth: Payer: Self-pay | Admitting: Internal Medicine

## 2018-12-18 NOTE — Telephone Encounter (Unsigned)
Copied from [place] [id number]. Topic: Quick Communication - Rx Refill/Question >> Dec 18, 2018  9:28 AM Celene Kras A wrote: Medication: HYDROcodone-acetaminophen (NORCO) 10-325 MG tablet  Has the patient contacted their pharmacy? No. (Agent: If no, request that the patient contact the pharmacy for the refill.) (Agent: If yes, when and what did the pharmacy advise?)  Preferred Pharmacy (with phone number or street name): [id number] [address], [place] [address] Phone: [phone] Fax: [phone] Not a 24 hour pharmacy; exact hours not known.    Agent: Please be advised that RX refills may take up to 3 business days. We ask that you follow-up with your pharmacy.

## 2018-12-18 NOTE — Telephone Encounter (Signed)
Per database last filled on 11/22/2018.

## 2018-12-20 NOTE — Telephone Encounter (Signed)
Patient is calling to check status of medication refill.  

## 2018-12-21 ENCOUNTER — Other Ambulatory Visit: Payer: Self-pay | Admitting: Internal Medicine

## 2018-12-21 DIAGNOSIS — J449 Chronic obstructive pulmonary disease, unspecified: Secondary | ICD-10-CM | POA: Diagnosis not present

## 2018-12-21 DIAGNOSIS — M159 Polyosteoarthritis, unspecified: Secondary | ICD-10-CM

## 2018-12-21 DIAGNOSIS — M5030 Other cervical disc degeneration, unspecified cervical region: Secondary | ICD-10-CM

## 2018-12-21 MED ORDER — HYDROCODONE-ACETAMINOPHEN 10-325 MG PO TABS: 1.0000 | ORAL_TABLET | ORAL | 0 refills | Status: DC | PRN

## 2019-01-01 LAB — HM DIABETES EYE EXAM

## 2019-01-14 DIAGNOSIS — J449 Chronic obstructive pulmonary disease, unspecified: Secondary | ICD-10-CM | POA: Diagnosis not present

## 2019-01-16 DIAGNOSIS — N870 Mild cervical dysplasia: Secondary | ICD-10-CM | POA: Diagnosis not present

## 2019-01-17 ENCOUNTER — Other Ambulatory Visit: Payer: Self-pay | Admitting: Obstetrics & Gynecology

## 2019-01-17 DIAGNOSIS — M8580 Other specified disorders of bone density and structure, unspecified site: Secondary | ICD-10-CM

## 2019-01-18 ENCOUNTER — Other Ambulatory Visit: Payer: Self-pay | Admitting: Internal Medicine

## 2019-01-18 ENCOUNTER — Telehealth: Payer: Self-pay | Admitting: Internal Medicine

## 2019-01-18 NOTE — Telephone Encounter (Signed)
Medication: HYDROcodone-acetaminophen (NORCO) 10-325 MG tablet    Patient is requesting refill of this medication.    Pharmacy:  [id number] [address], [place]. [phone] (Phone) [phone] (Fax)

## 2019-01-20 ENCOUNTER — Other Ambulatory Visit: Payer: Self-pay | Admitting: Internal Medicine

## 2019-01-20 DIAGNOSIS — M159 Polyosteoarthritis, unspecified: Secondary | ICD-10-CM

## 2019-01-20 DIAGNOSIS — M5030 Other cervical disc degeneration, unspecified cervical region: Secondary | ICD-10-CM

## 2019-01-20 MED ORDER — HYDROCODONE-ACETAMINOPHEN 10-325 MG PO TABS: 1.0000 | ORAL_TABLET | ORAL | 0 refills | Status: DC | PRN

## 2019-01-29 ENCOUNTER — Ambulatory Visit: Payer: Medicare Other | Admitting: Internal Medicine

## 2019-01-29 LAB — HM PAP SMEAR

## 2019-02-07 DIAGNOSIS — Z1231 Encounter for screening mammogram for malignant neoplasm of breast: Secondary | ICD-10-CM | POA: Diagnosis not present

## 2019-02-07 LAB — HM MAMMOGRAPHY

## 2019-02-18 ENCOUNTER — Ambulatory Visit (INDEPENDENT_AMBULATORY_CARE_PROVIDER_SITE_OTHER): Payer: Medicare Other | Admitting: Internal Medicine

## 2019-02-18 ENCOUNTER — Encounter: Payer: Self-pay | Admitting: Internal Medicine

## 2019-02-18 ENCOUNTER — Telehealth: Payer: Self-pay | Admitting: Internal Medicine

## 2019-02-18 ENCOUNTER — Other Ambulatory Visit: Payer: Self-pay | Admitting: Internal Medicine

## 2019-02-18 ENCOUNTER — Other Ambulatory Visit (INDEPENDENT_AMBULATORY_CARE_PROVIDER_SITE_OTHER): Payer: Medicare Other

## 2019-02-18 ENCOUNTER — Other Ambulatory Visit: Payer: Self-pay

## 2019-02-18 VITALS — BP 138/80 | HR 76 | Temp 98.2°F | Ht 66.5 in | Wt 211.5 lb

## 2019-02-18 DIAGNOSIS — J4520 Mild intermittent asthma, uncomplicated: Secondary | ICD-10-CM

## 2019-02-18 DIAGNOSIS — Z79891 Long term (current) use of opiate analgesic: Secondary | ICD-10-CM

## 2019-02-18 DIAGNOSIS — J4489 Other specified chronic obstructive pulmonary disease: Secondary | ICD-10-CM

## 2019-02-18 DIAGNOSIS — I700 Atherosclerosis of aorta: Secondary | ICD-10-CM

## 2019-02-18 DIAGNOSIS — E118 Type 2 diabetes mellitus with unspecified complications: Secondary | ICD-10-CM

## 2019-02-18 DIAGNOSIS — M159 Polyosteoarthritis, unspecified: Secondary | ICD-10-CM

## 2019-02-18 DIAGNOSIS — F1220 Cannabis dependence, uncomplicated: Secondary | ICD-10-CM

## 2019-02-18 DIAGNOSIS — M150 Primary generalized (osteo)arthritis: Secondary | ICD-10-CM

## 2019-02-18 DIAGNOSIS — M5030 Other cervical disc degeneration, unspecified cervical region: Secondary | ICD-10-CM

## 2019-02-18 DIAGNOSIS — J449 Chronic obstructive pulmonary disease, unspecified: Secondary | ICD-10-CM

## 2019-02-18 DIAGNOSIS — E785 Hyperlipidemia, unspecified: Secondary | ICD-10-CM | POA: Diagnosis not present

## 2019-02-18 DIAGNOSIS — J4521 Mild intermittent asthma with (acute) exacerbation: Secondary | ICD-10-CM

## 2019-02-18 DIAGNOSIS — R7303 Prediabetes: Secondary | ICD-10-CM | POA: Diagnosis not present

## 2019-02-18 HISTORY — DX: Type 2 diabetes mellitus with unspecified complications: E11.8

## 2019-02-18 HISTORY — DX: Chronic obstructive pulmonary disease, unspecified: J44.9

## 2019-02-18 LAB — BASIC METABOLIC PANEL
BUN: 11 mg/dL (ref 6–23)
CO2: 26 mEq/L (ref 19–32)
Calcium: 9.4 mg/dL (ref 8.4–10.5)
Chloride: 103 mEq/L (ref 96–112)
Creatinine, Ser: 0.75 mg/dL (ref 0.40–1.20)
GFR: 78.22 mL/min (ref >60.00)
Glucose, Bld: 123 mg/dL — ABNORMAL HIGH (ref 70–99)
Potassium: 4.3 mEq/L (ref 3.5–5.1)
Sodium: 138 mEq/L (ref 135–145)

## 2019-02-18 LAB — HEMOGLOBIN A1C: Hgb A1c MFr Bld: 6.5 % (ref 4.6–6.5)

## 2019-02-18 MED ORDER — ROSUVASTATIN CALCIUM 10 MG PO TABS: 10.0000 mg | ORAL_TABLET | Freq: Every day | ORAL | 1 refills | Status: DC

## 2019-02-18 MED ORDER — HYDROCODONE-ACETAMINOPHEN 10-325 MG PO TABS: 1.0000 | ORAL_TABLET | ORAL | 0 refills | Status: DC | PRN

## 2019-02-18 NOTE — Progress Notes (Signed)
Subjective:  Patient ID: Felicia Odom, female    DOB: Dec 13, 1955  Age: 63 y.o. MRN: [id number]  CC: Diabetes   HPI Felicia Odom presents for f/up - She complains of weight gain.  She walks several miles a day and denies any recent episodes of CP, DOE, palpitations, edema, or fatigue.  Outpatient Medications Prior to Visit  Medication Sig Dispense Refill  . albuterol (PROAIR HFA) 108 (90 Base) MCG/ACT inhaler INHALE 1 TO 2 PUFFS INTO THE LUNGS EVERY 4 HOURS AS NEEDED FOR WHEEZING OR SHORTNESS OF BREATH 8.5 Inhaler 6  . albuterol (PROVENTIL) (2.5 MG/3ML) 0.083% nebulizer solution Take 3 mLs (2.5 mg total) by nebulization every 6 (six) hours. Dx COPD with bronchitis 120 mL 5  . ALPRAZolam (XANAX) 1 MG tablet Take 1 tablet (1 mg total) by mouth every 6 (six) hours as needed. 120 tablet 5  . aspirin EC 81 MG tablet Take 81 mg by mouth daily.    . Clobetasol Prop Emollient Base 0.05 % emollient cream APPLY TO AFFECTED AREA TWICE A DAY 60 g 1  . esomeprazole (NEXIUM) 40 MG capsule TAKE 1 CAPSULE BY MOUTH EVERY DAY 90 capsule 1  . ibuprofen (ADVIL) 800 MG tablet TAKE 1 TABLET BY MOUTH EVERY 8 HOURS AS NEEDED 180 tablet 1  . NARCAN 4 MG/0.1ML LIQD nasal spray kit PLACE 1 SPRAY INTO THE NOSE ONCE FOR 1 DOSE  2  . HYDROcodone-acetaminophen (NORCO) 10-325 MG tablet Take 1 tablet by mouth every 4 (four) hours as needed (for pain). 90 tablet 0   No facility-administered medications prior to visit.     ROS Review of Systems  Constitutional: Positive for unexpected weight change (wt gain). Negative for appetite change, chills, diaphoresis and fatigue.  HENT: Negative.   Eyes: Negative.   Respiratory: Negative for cough, chest tightness, shortness of breath and wheezing.   Cardiovascular: Negative for chest pain, palpitations and leg swelling.  Gastrointestinal: Negative for abdominal pain, constipation, diarrhea, nausea and vomiting.  Endocrine: Negative.  Negative for cold  intolerance, heat intolerance, polydipsia, polyphagia and polyuria.  Genitourinary: Negative.  Negative for difficulty urinating and dysuria.  Musculoskeletal: Positive for arthralgias and neck pain. Negative for myalgias.       She complains of chronic musculoskeletal pain but is not willing to consider having any surgeries or procedures.  She wants to continue taking hydrocodone and acetaminophen to control the pain.  Skin: Negative.  Negative for color change.  Neurological: Negative.   Hematological: Negative.   Psychiatric/Behavioral: Negative.     Objective:  BP 138/80 (BP Location: Left Arm, Patient Position: Sitting, Cuff Size: Large)   Pulse 76   Temp 98.2 F (36.8 C) (Oral)   Ht 5' 6.5" (1.689 m)   Wt 211 lb 8 oz (95.9 kg)   SpO2 96%   BMI 33.63 kg/m   BP Readings from Last 3 Encounters:  02/18/19 138/80  10/01/18 130/80  09/24/18 140/84    Wt Readings from Last 3 Encounters:  02/18/19 211 lb 8 oz (95.9 kg)  10/01/18 203 lb 12.8 oz (92.4 kg)  09/24/18 205 lb 12.8 oz (93.4 kg)    Physical Exam Constitutional:      Appearance: She is obese. She is not ill-appearing or diaphoretic.  HENT:     Nose: Nose normal. No congestion.     Mouth/Throat:     Pharynx: Oropharynx is clear.  Eyes:     General: No scleral icterus. Neck:     Musculoskeletal:  Normal range of motion and neck supple. No neck rigidity or muscular tenderness.  Cardiovascular:     Rate and Rhythm: Normal rate and regular rhythm.     Heart sounds: No murmur. No gallop.   Pulmonary:     Effort: Pulmonary effort is normal.     Breath sounds: No stridor. No wheezing, rhonchi or rales.  Abdominal:     General: Abdomen is protuberant. Bowel sounds are increased. There is no distension.     Palpations: Abdomen is soft. There is no hepatomegaly, splenomegaly or mass.     Tenderness: There is no abdominal tenderness.  Musculoskeletal: Normal range of motion.     Right lower leg: No edema.     Left  lower leg: No edema.  Lymphadenopathy:     Cervical: No cervical adenopathy.  Skin:    General: Skin is warm and dry.  Neurological:     General: No focal deficit present.     Mental Status: She is alert and oriented to person, place, and time. Mental status is at baseline.  Psychiatric:        Mood and Affect: Mood normal.        Behavior: Behavior normal.        Thought Content: Thought content normal.        Judgment: Judgment normal.     Lab Results  Component Value Date   WBC 5.4 07/30/2018   HGB 14.7 07/30/2018   HCT 43.8 07/30/2018   PLT 266.0 07/30/2018   GLUCOSE 123 (H) 02/18/2019   CHOL 180 07/30/2018   TRIG 121.0 07/30/2018   HDL 46.20 07/30/2018   LDLDIRECT 142.0 01/21/2015   LDLCALC 110 (H) 07/30/2018   ALT 15 07/30/2018   AST 15 07/30/2018   NA 138 02/18/2019   K 4.3 02/18/2019   CL 103 02/18/2019   CREATININE 0.75 02/18/2019   BUN 11 02/18/2019   CO2 26 02/18/2019   TSH 2.05 07/30/2018   INR 0.89 06/08/2016   HGBA1C 6.5 02/18/2019    Dg Chest 2 View  Result Date: 10/01/2018 CLINICAL DATA:  Cough and shortness of breath. EXAM: CHEST - 2 VIEW COMPARISON:  CT chest dated July 11, 2018. FINDINGS: The heart size and mediastinal contours are within normal limits. Atherosclerotic calcification of the aortic arch. Normal pulmonary vascularity. No focal consolidation, pleural effusion, or pneumothorax. No acute osseous abnormality. IMPRESSION: No active cardiopulmonary disease. Electronically Signed   By: Titus Dubin M.D.   On: 10/01/2018 11:41    Assessment & Plan:   Felicia Odom was seen today for diabetes.  Diagnoses and all orders for this visit:  Prediabetes- Her A1c is up to 6.5%. -     Basic metabolic panel; Future -     Hemoglobin A1c; Future  Asthma with COPD ([place])  Moderate tetrahydrocannabinol (THC) dependence ([place])  Atherosclerosis of aorta ([place])- I have asked her to start taking a statin for risk reduction.  Morbid obesity,  unspecified obesity type ([place])  Encounter for long-term opiate analgesic use- I will check a urine drug screen to monitor for substance abuse and to screen for compliance. -     Pain Mgmt, Profile 8 w/Conf, U; Future  Type II diabetes mellitus with manifestations ([place])- Her A1c is at 6.5% now.  She has new onset type 2 diabetes mellitus.  This does not yet need to be treated with a medication.  I encouraged her to continue working on her lifestyle modifications. -     HM Diabetes  Foot Exam  Hyperlipidemia with target LDL less than 100- I have asked her to start taking a statin for cardiovascular risk reduction. -     rosuvastatin (CRESTOR) 10 MG tablet; Take 1 tablet (10 mg total) by mouth daily.  DDD (degenerative disc disease), cervical -     HYDROcodone-acetaminophen (NORCO) 10-325 MG tablet; Take 1 tablet by mouth every 4 (four) hours as needed (for pain).  Primary osteoarthritis involving multiple joints -     HYDROcodone-acetaminophen (NORCO) 10-325 MG tablet; Take 1 tablet by mouth every 4 (four) hours as needed (for pain).   I am having Felicia Odom start on rosuvastatin. I am also having her maintain her aspirin EC, albuterol, Narcan, albuterol, Clobetasol Prop Emollient Base, ALPRAZolam, esomeprazole, ibuprofen, and HYDROcodone-acetaminophen.  Meds ordered this encounter  Medications  . rosuvastatin (CRESTOR) 10 MG tablet    Sig: Take 1 tablet (10 mg total) by mouth daily.    Dispense:  90 tablet    Refill:  1  . HYDROcodone-acetaminophen (NORCO) 10-325 MG tablet    Sig: Take 1 tablet by mouth every 4 (four) hours as needed (for pain).    Dispense:  90 tablet    Refill:  0     Follow-up: Return in about 4 months (around 06/21/2019).  Scarlette Calico, MD

## 2019-02-18 NOTE — Telephone Encounter (Signed)
Copied from [place] [id number]. Topic: General - Other >> Feb 18, 2019 10:52 AM Yvette Rack wrote: Reason for CRM: Pt stated she went to pick up her prescriptions after her appt with Dr. Ronnald Ramp but she was advised by her pharmacy that they have not received a request to refill the HYDROcodone-acetaminophen (NORCO) 10-325 MG tablet. Pt [facility] that [facility] Rx [id number] [place], [place].

## 2019-02-18 NOTE — Patient Instructions (Signed)
Type 2 Diabetes Mellitus, Diagnosis, Adult Type 2 diabetes (type 2 diabetes mellitus) is a long-term (chronic) disease. In type 2 diabetes, one or both of these problems may be present:  The pancreas does not make enough of a hormone called insulin.  Cells in the body do not respond properly to insulin that the body makes (insulin resistance). Normally, insulin allows blood sugar (glucose) to enter cells in the body. The cells use glucose for energy. Insulin resistance or lack of insulin causes excess glucose to build up in the blood instead of going into cells. As a result, high blood glucose (hyperglycemia) develops. What increases the risk? The following factors may make you more likely to develop type 2 diabetes:  Having a family member with type 2 diabetes.  Being overweight or obese.  Having an inactive (sedentary) lifestyle.  Having been diagnosed with insulin resistance.  Having a history of prediabetes, gestational diabetes, or polycystic ovary syndrome (PCOS).  Being of American-Indian, African-American, Hispanic/Latino, or Asian/Pacific Islander descent. What are the signs or symptoms? In the early stage of this condition, you may not have symptoms. Symptoms develop slowly and may include:  Increased thirst (polydipsia).  Increased hunger(polyphagia).  Increased urination (polyuria).  Increased urination during the night (nocturia).  Unexplained weight loss.  Frequent infections that keep coming back (recurring).  Fatigue.  Weakness.  Vision changes, such as blurry vision.  Cuts or bruises that are slow to heal.  Tingling or numbness in the hands or feet.  Dark patches on the skin (acanthosis nigricans). How is this diagnosed? This condition is diagnosed based on your symptoms, your medical history, a physical exam, and your blood glucose level. Your blood glucose may be checked with one or more of the following blood tests:  A fasting blood glucose (FBG)  test. You will not be allowed to eat (you will fast) for 8 hours or longer before a blood sample is taken.  A random blood glucose test. This test checks blood glucose at any time of day regardless of when you ate.  An A1c (hemoglobin A1c) blood test. This test provides information about blood glucose control over the previous 2-3 months.  An oral glucose tolerance test (OGTT). This test measures your blood glucose at two times: ? After fasting. This is your baseline blood glucose level. ? Two hours after drinking a beverage that contains glucose. You may be diagnosed with type 2 diabetes if:  Your FBG level is 126 mg/dL (7.0 mmol/L) or higher.  Your random blood glucose level is 200 mg/dL (11.1 mmol/L) or higher.  Your A1c level is 6.5% or higher.  Your OGTT result is higher than 200 mg/dL (11.1 mmol/L). These blood tests may be repeated to confirm your diagnosis. How is this treated? Your treatment may be managed by a specialist called an endocrinologist. Type 2 diabetes may be treated by following instructions from your health care provider about:  Making diet and lifestyle changes. This may include: ? Following an individualized nutrition plan that is developed by a diet and nutrition specialist (registered dietitian). ? Exercising regularly. ? Finding ways to manage stress.  Checking your blood glucose level as often as told.  Taking diabetes medicines or insulin daily. This helps to keep your blood glucose levels in the healthy range. ? If you use insulin, you may need to adjust the dosage depending on how physically active you are and what foods you eat. Your health care provider will tell you how to adjust your dosage.    Taking medicines to help prevent complications from diabetes, such as: ? Aspirin. ? Medicine to lower cholesterol. ? Medicine to control blood pressure. Your health care provider will set individualized treatment goals for you. Your goals will be based on  your age, other medical conditions you have, and how you respond to diabetes treatment. Generally, the goal of treatment is to maintain the following blood glucose levels:  Before meals (preprandial): 80-130 mg/dL (4.4-7.2 mmol/L).  After meals (postprandial): below 180 mg/dL (10 mmol/L).  A1c level: less than 7%. Follow these instructions at home: Questions to ask your health care provider  Consider asking the following questions: ? Do I need to meet with a diabetes educator? ? Where can I find a support group for people with diabetes? ? What equipment will I need to manage my diabetes at home? ? What diabetes medicines do I need, and when should I take them? ? How often do I need to check my blood glucose? ? What number can I call if I have questions? ? When is my next appointment? General instructions  Take over-the-counter and prescription medicines only as told by your health care provider.  Keep all follow-up visits as told by your health care provider. This is important.  For more information about diabetes, visit: ? American Diabetes Association (ADA): www.diabetes.org ? American Association of Diabetes Educators (AADE): www.diabeteseducator.org Contact a health care provider if:  Your blood glucose is at or above 240 mg/dL (13.3 mmol/L) for 2 days in a row.  You have been sick or have had a fever for 2 days or longer, and you are not getting better.  You have any of the following problems for more than 6 hours: ? You cannot eat or drink. ? You have nausea and vomiting. ? You have diarrhea. Get help right away if:  Your blood glucose is lower than 54 mg/dL (3.0 mmol/L).  You become confused or you have trouble thinking clearly.  You have difficulty breathing.  You have moderate or large ketone levels in your urine. Summary  Type 2 diabetes (type 2 diabetes mellitus) is a long-term (chronic) disease. In type 2 diabetes, the pancreas does not make enough of a  hormone called insulin, or cells in the body do not respond properly to insulin that the body makes (insulin resistance).  This condition is treated by making diet and lifestyle changes and taking diabetes medicines or insulin.  Your health care provider will set individualized treatment goals for you. Your goals will be based on your age, other medical conditions you have, and how you respond to diabetes treatment.  Keep all follow-up visits as told by your health care provider. This is important. This information is not intended to replace advice given to you by your health care provider. Make sure you discuss any questions you have with your health care provider. Document Released: 07/11/2005 Document Revised: 09/08/2017 Document Reviewed: 08/14/2015 Elsevier Patient Education  2020 Elsevier Inc.  

## 2019-02-18 NOTE — Telephone Encounter (Signed)
Informed pt .

## 2019-02-18 NOTE — Telephone Encounter (Signed)
Can you let her know that he has sent it in?

## 2019-02-22 LAB — PAIN MGMT, PROFILE 8 W/CONF, U
6 Acetylmorphine: NEGATIVE ng/mL
Alcohol Metabolites: NEGATIVE ng/mL (ref <500)
Alphahydroxyalprazolam: 103 ng/mL
Alphahydroxymidazolam: NEGATIVE ng/mL
Alphahydroxytriazolam: NEGATIVE ng/mL
Aminoclonazepam: NEGATIVE ng/mL
Amphetamines: NEGATIVE ng/mL
Benzodiazepines: POSITIVE ng/mL
Buprenorphine, Urine: NEGATIVE ng/mL
Cocaine Metabolite: NEGATIVE ng/mL
Codeine: NEGATIVE ng/mL
Creatinine: 16 mg/dL
Hydrocodone: NEGATIVE ng/mL
Hydromorphone: NEGATIVE ng/mL
Hydroxyethylflurazepam: NEGATIVE ng/mL
Lorazepam: NEGATIVE ng/mL
MDMA: NEGATIVE ng/mL
Marijuana Metabolite: 180 ng/mL
Marijuana Metabolite: POSITIVE ng/mL
Morphine: NEGATIVE ng/mL
Nordiazepam: NEGATIVE ng/mL
Norhydrocodone: 53 ng/mL
Opiates: POSITIVE ng/mL
Oxazepam: NEGATIVE ng/mL
Oxidant: NEGATIVE ug/mL
Oxycodone: NEGATIVE ng/mL
Specific Gravity: 1.002 — ABNORMAL LOW (ref >=1.0)
Temazepam: NEGATIVE ng/mL
pH: 7.2 (ref 4.5–9.0)

## 2019-02-27 DIAGNOSIS — L821 Other seborrheic keratosis: Secondary | ICD-10-CM | POA: Diagnosis not present

## 2019-02-27 DIAGNOSIS — L298 Other pruritus: Secondary | ICD-10-CM | POA: Diagnosis not present

## 2019-02-27 DIAGNOSIS — L820 Inflamed seborrheic keratosis: Secondary | ICD-10-CM | POA: Diagnosis not present

## 2019-02-27 DIAGNOSIS — I788 Other diseases of capillaries: Secondary | ICD-10-CM | POA: Diagnosis not present

## 2019-02-27 DIAGNOSIS — L570 Actinic keratosis: Secondary | ICD-10-CM | POA: Diagnosis not present

## 2019-03-11 ENCOUNTER — Telehealth: Payer: Self-pay | Admitting: Internal Medicine

## 2019-03-11 MED ORDER — ALPRAZOLAM 1 MG PO TABS: 1.0000 mg | ORAL_TABLET | Freq: Four times a day (QID) | ORAL | 5 refills | Status: DC | PRN

## 2019-03-11 NOTE — Telephone Encounter (Signed)
Pt called requesting refill of alprazolam. Pt last seen 10/01/2018 by Brian Mack, NP. Alprazolam 1 mg was prescribed 09/24/2018 disp 120 tab x5 refills.   I called and spoke w/ pt. Pt states she has enough to last her until 03/14/2019 and was wondering if she could go ahead and get another refill. I let her know I would put in a message to Dr. Young. Pt expressed understanding.   Current Outpatient Medications on File Prior to Visit  Medication Sig Dispense Refill  . albuterol (PROAIR HFA) 108 (90 Base) MCG/ACT inhaler INHALE 1 TO 2 PUFFS INTO THE LUNGS EVERY 4 HOURS AS NEEDED FOR WHEEZING OR SHORTNESS OF BREATH 8.5 Inhaler 6  . albuterol (PROVENTIL) (2.5 MG/3ML) 0.083% nebulizer solution Use 1 vial every 6 hours and as needed.DX:[id number] 120 mL 11  . ALPRAZolam (XANAX) 1 MG tablet Take 1 tablet (1 mg total) by mouth every 6 (six) hours as needed. 120 tablet 5  . aspirin EC 81 MG tablet Take 81 mg by mouth daily.    . Clobetasol Prop Emollient Base 0.05 % emollient cream APPLY TO AFFECTED AREA TWICE A DAY 60 g 1  . esomeprazole (NEXIUM) 40 MG capsule TAKE 1 CAPSULE BY MOUTH EVERY DAY 90 capsule 1  . HYDROcodone-acetaminophen (NORCO) 10-325 MG tablet Take 1 tablet by mouth every 4 (four) hours as needed (for pain). 90 tablet 0  . ibuprofen (ADVIL) 800 MG tablet TAKE 1 TABLET BY MOUTH EVERY 8 HOURS AS NEEDED 180 tablet 1  . NARCAN 4 MG/0.1ML LIQD nasal spray kit PLACE 1 SPRAY INTO THE NOSE ONCE FOR 1 DOSE  2  . rosuvastatin (CRESTOR) 10 MG tablet Take 1 tablet (10 mg total) by mouth daily. 90 tablet 1  . [DISCONTINUED] progesterone (PROMETRIUM) 100 MG capsule Take 100 mg by mouth daily.       No current facility-administered medications on file prior to visit.    Allergies  Allergen Reactions  . No Known Allergies     CY, please advise on the refill of this medication. Thank you.  

## 2019-03-11 NOTE — Telephone Encounter (Signed)
Called and spoke w/ pt to let her know her alprazolam refill has been sent to her preferred pharmacy. Pt verbalized understanding and stated she received the notification from her pharmacy. Nothing further needed at this time.

## 2019-03-11 NOTE — Progress Notes (Signed)
HPI: FU palpitations. Nuclear study October 2017 showed ejection fraction 56%, probable shifting breast attenuation, cannot rule out mild apical ischemia. Patient had previous resection of thymoma. Chest CT December 2018 showed no residual mass. There was note of aortic atherosclerosis. Monitor 1/19 showed sinus rhythm. Echo 1/19 showed normal LV function, mild AS with mean gradient 14 mmHg. Since last seenshe does have dyspnea on exertion.  No orthopnea, PND, pedal edema or syncope.  Occasional brief palpitations similar to previous.  Occasional chest heaviness with palpitations but no chest heaviness with activities.  Current Outpatient Medications  Medication Sig Dispense Refill  . albuterol (PROAIR HFA) 108 (90 Base) MCG/ACT inhaler INHALE 1 TO 2 PUFFS INTO THE LUNGS EVERY 4 HOURS AS NEEDED FOR WHEEZING OR SHORTNESS OF BREATH 8.5 Inhaler 6  . albuterol (PROVENTIL) (2.5 MG/3ML) 0.083% nebulizer solution Use 1 vial every 6 hours and as needed.DX:[id number] 120 mL 11  . ALPRAZolam (XANAX) 1 MG tablet Take 1 tablet (1 mg total) by mouth every 6 (six) hours as needed. 120 tablet 5  . aspirin EC 81 MG tablet Take 81 mg by mouth daily.    Marland Kitchen esomeprazole (NEXIUM) 40 MG capsule TAKE 1 CAPSULE BY MOUTH EVERY DAY 90 capsule 1  . HYDROcodone-acetaminophen (NORCO) 10-325 MG tablet Take 1 tablet by mouth every 4 (four) hours as needed (for pain). 90 tablet 0  . ibuprofen (ADVIL) 800 MG tablet TAKE 1 TABLET BY MOUTH EVERY 8 HOURS AS NEEDED 180 tablet 1  . NARCAN 4 MG/0.1ML LIQD nasal spray kit PLACE 1 SPRAY INTO THE NOSE ONCE FOR 1 DOSE  2  . rosuvastatin (CRESTOR) 10 MG tablet Take 1 tablet (10 mg total) by mouth daily. (Patient not taking: Reported on 03/18/2019) 90 tablet 1   No current facility-administered medications for this visit.      Past Medical History:  Diagnosis Date  . Arthritis    neck  . Asthma   . Chronic back pain   . Chronic bronchitis ([place])   . Chronic neck pain   . COPD  (chronic obstructive pulmonary disease) ([facility])    Advair and Albuterol as needed as well as neb  . GERD (gastroesophageal reflux disease)    takes Nexium daily  . History of blood transfusion    in the 70's. No abnormal reaction noted  . History of colon polyps    benign  . Hyperlipidemia    not on any meds bc refused to pick up from pharmacy  . Joint pain   . Joint swelling   . Mediastinal tumor   . Nocturia   . OCD (obsessive compulsive disorder)   . Panic disorder    takes Xanax daily as needed  . Pneumonia    2016  . PONV (postoperative nausea and vomiting)   . Seizures ([place])    in the 80's    Past Surgical History:  Procedure Laterality Date  . COLONOSCOPY    . ECTOPIC PREGNANCY SURGERY     x 2   . ESOPHAGOGASTRODUODENOSCOPY    . LAPAROSCOPIC LYSIS INTESTINAL ADHESIONS     x 2  . RESECTION OF MEDIASTINAL MASS N/A 06/10/2016   Procedure: RESECTION OF MEDIASTINAL MASS;  Surgeon: Grace Isaac, MD;  Location: [facility];  Service: Thoracic;  Laterality: N/A;  . VIDEO ASSISTED THORACOSCOPY Right 06/10/2016   Procedure: VIDEO ASSISTED THORACOSCOPY WITH PLACEMENT OF ON Verdie Mosher;  Surgeon: Grace Isaac, MD;  Location: [facility];  Service: Thoracic;  Laterality: Right;  Marland Kitchen VIDEO BRONCHOSCOPY N/A 06/10/2016   Procedure: VIDEO BRONCHOSCOPY;  Surgeon: Grace Isaac, MD;  Location: [facility];  Service: Thoracic;  Laterality: N/A;    Social History   Socioeconomic History  . Marital status: Divorced    Spouse name: Not on file  . Number of children: 1  . Years of education: Not on file  . Highest education level: Not on file  Occupational History  . Occupation: Financial controller at [employer]  . Financial resource strain: Not on file  . Food insecurity    Worry: Not on file    Inability: Not on file  . Transportation needs    Medical: Not on file    Non-medical: Not on file  Tobacco Use  . Smoking status: Former Smoker    Packs/day: 1.00    Years: 36.00     Pack years: 36.00    Types: Cigarettes    Quit date: 2017    Years since quitting: 3.6  . Smokeless tobacco: Never Used  . Tobacco comment: last cigarette 2017  Substance and Sexual Activity  . Alcohol use: No    Alcohol/week: 0.0 standard drinks  . Drug use: No  . Sexual activity: Not on file  Lifestyle  . Physical activity    Days per week: Not on file    Minutes per session: Not on file  . Stress: Not on file  Relationships  . Social Herbalist on phone: Not on file    Gets together: Not on file    Attends religious service: Not on file    Active member of club or organization: Not on file    Attends meetings of clubs or organizations: Not on file    Relationship status: Not on file  . Intimate partner violence    Fear of current or ex partner: Not on file    Emotionally abused: Not on file    Physically abused: Not on file    Forced sexual activity: Not on file  Other Topics Concern  . Not on file  Social History Narrative  . Not on file    Family History  Problem Relation Age of Onset  . Heart attack Father   . Diabetes Mother   . Clotting disorder Mother        PE  . Breast cancer Sister   . Other Sister        colon mass-benign  . Esophageal cancer Neg Hx   . Colon cancer Neg Hx   . Rectal cancer Neg Hx   . Stomach cancer Neg Hx     ROS: no fevers or chills, productive cough, hemoptysis, dysphasia, odynophagia, melena, hematochezia, dysuria, hematuria, rash, seizure activity, orthopnea, PND, pedal edema, claudication. Remaining systems are negative.  Physical Exam: Well-developed well-nourished in no acute distress.  Skin is warm and dry.  HEENT is normal.  Neck is supple.  Chest is clear to auscultation with normal expansion.  Cardiovascular exam is regular rate and rhythm.  2/6 systolic murmur left sternal border. Abdominal exam nontender or distended. No masses palpated. Extremities show no edema. neuro grossly intact  ECG-sinus  rhythm at a rate of 63, no ST changes.  Personally reviewed  A/P  1 palpitations-symptoms essentially unchanged.  We discussed the need for a rhythm strip or electrocardiogram at the time of symptoms to establish diagnosis.  If she has more difficulties in the future could consider addition of beta-blocker.  2  hyperlipidemia-given atherosclerosis noted on prior CT scan I will add Crestor 20 mg daily.  Check lipids and liver in 8 weeks.  3 history of mild aortic stenosis-we will plan to repeat echocardiogram.  4 obesity-we discussed exercise, diet and weight loss.  She is trying to walk at present and also has changed her diet.  5 aortic atherosclerosis-continue aspirin and add statin.  Kirk Ruths, MD

## 2019-03-11 NOTE — Telephone Encounter (Signed)
Alprazolam refill e-sent 

## 2019-03-14 ENCOUNTER — Telehealth: Payer: Self-pay | Admitting: Internal Medicine

## 2019-03-14 NOTE — Telephone Encounter (Signed)
Too early.   Last fill was 02/18/2019

## 2019-03-14 NOTE — Telephone Encounter (Signed)
Medication Refill - Medication: HYDROcodone-acetaminophen (NORCO) 10-325 MG tablet    Preferred Pharmacy (with phone number or street name): [id number] [place], [place] - [address] [phone] (Phone) [phone] (Fax)      

## 2019-03-14 NOTE — Telephone Encounter (Signed)
Controlled substance refill.

## 2019-03-16 ENCOUNTER — Other Ambulatory Visit: Payer: Self-pay | Admitting: Internal Medicine

## 2019-03-16 DIAGNOSIS — K219 Gastro-esophageal reflux disease without esophagitis: Secondary | ICD-10-CM

## 2019-03-17 ENCOUNTER — Other Ambulatory Visit: Payer: Self-pay | Admitting: Internal Medicine

## 2019-03-17 DIAGNOSIS — M159 Polyosteoarthritis, unspecified: Secondary | ICD-10-CM

## 2019-03-17 DIAGNOSIS — M5030 Other cervical disc degeneration, unspecified cervical region: Secondary | ICD-10-CM

## 2019-03-18 ENCOUNTER — Other Ambulatory Visit: Payer: Self-pay | Admitting: Internal Medicine

## 2019-03-18 ENCOUNTER — Encounter: Payer: Self-pay | Admitting: Cardiology

## 2019-03-18 ENCOUNTER — Other Ambulatory Visit: Payer: Self-pay

## 2019-03-18 ENCOUNTER — Ambulatory Visit (INDEPENDENT_AMBULATORY_CARE_PROVIDER_SITE_OTHER): Payer: Medicare Other | Admitting: Cardiology

## 2019-03-18 VITALS — BP 126/70 | HR 63 | Temp 97.1°F | Ht 66.0 in | Wt 207.0 lb

## 2019-03-18 DIAGNOSIS — R002 Palpitations: Secondary | ICD-10-CM | POA: Diagnosis not present

## 2019-03-18 DIAGNOSIS — E7800 Pure hypercholesterolemia, unspecified: Secondary | ICD-10-CM | POA: Diagnosis not present

## 2019-03-18 DIAGNOSIS — L309 Dermatitis, unspecified: Secondary | ICD-10-CM

## 2019-03-18 DIAGNOSIS — I350 Nonrheumatic aortic (valve) stenosis: Secondary | ICD-10-CM

## 2019-03-18 DIAGNOSIS — E785 Hyperlipidemia, unspecified: Secondary | ICD-10-CM

## 2019-03-18 MED ORDER — ROSUVASTATIN CALCIUM 10 MG PO TABS: 10.0000 mg | ORAL_TABLET | Freq: Every day | ORAL | 3 refills | Status: DC

## 2019-03-18 NOTE — Patient Instructions (Signed)
Medication Instructions:  START ROSUVASTATIN 20 MG ONCE DAILY  If you need a refill on your cardiac medications before your next appointment, please call your pharmacy.   Lab work: Your physician recommends that you return for lab work in: [place]  If you have labs (blood work) drawn today and your tests are completely normal, you will receive your results only by: Marland Kitchen MyChart Message (if you have MyChart) OR . A paper copy in the mail If you have any lab test that is abnormal or we need to change your treatment, we will call you to review the results.  Testing/Procedures: Your physician has requested that you have an echocardiogram. Echocardiography is a painless test that uses sound waves to create images of your heart. It provides your doctor with information about the size and shape of your heart and how well your heart's chambers and valves are working. This procedure takes approximately one hour. There are no restrictions for this procedure.[place]    Follow-Up: At [facility], you and your health needs are our priority.  As part of our continuing mission to provide you with exceptional heart care, we have created designated Provider Care Teams.  These Care Teams include your primary Cardiologist (physician) and Advanced Practice Providers (APPs -  Physician Assistants and Nurse Practitioners) who all work together to provide you with the care you need, when you need it. You will need a follow up appointment in 6 months.  Please call our office 2 months in advance to schedule this appointment.  You may see Kirk Ruths MD or one of the following Advanced Practice Providers on your designated Care Team:   Kerin Ransom, PA-C Roby Lofts, [place] . Sande Rives, PA-C

## 2019-03-18 NOTE — Telephone Encounter (Signed)
Pt calling again to request her  HYDROcodone-acetaminophen [facility]) 10-325 MG tablet  [id number] [place], [place]. [phone] (Phone) [phone] (Fax)

## 2019-03-19 ENCOUNTER — Telehealth: Payer: Self-pay | Admitting: Internal Medicine

## 2019-03-19 NOTE — Telephone Encounter (Signed)
Called and spoke w/ pt. Pt states [employer] called her about three times today stating they sent a fax to our office for a refill authorization on her albuterol nebulizer solution. I let her know CY is not currently in the office today, but that I would be happy check incoming fax, CY's mail, and his look-at to see if I could find the request and get an APP to sign for it. Pt states she has plenty of albuterol neb solution to hold her over until her f/u w/ CY on 03/27/2019 and to not worry about it until then. I let her know I would check anyway that way we can follow-up on it once CY returns to the office. Pt verbalized understanding.   I have checked incoming fax, CY's mail, and his look-at and have not found a request from [facility] for this pt's albuterol nebulizer solution. I am routing this message to myself as an FYI. Pt has an appt w/ CY on 03/27/2019 at 9:00 AM. Nothing further needed at this time.

## 2019-03-20 ENCOUNTER — Telehealth: Payer: Self-pay | Admitting: Internal Medicine

## 2019-03-20 ENCOUNTER — Other Ambulatory Visit: Payer: Self-pay | Admitting: Internal Medicine

## 2019-03-20 DIAGNOSIS — M159 Polyosteoarthritis, unspecified: Secondary | ICD-10-CM

## 2019-03-20 DIAGNOSIS — M5030 Other cervical disc degeneration, unspecified cervical region: Secondary | ICD-10-CM

## 2019-03-20 MED ORDER — HYDROCODONE-ACETAMINOPHEN 10-325 MG PO TABS: 1.0000 | ORAL_TABLET | ORAL | 0 refills | Status: DC | PRN

## 2019-03-20 NOTE — Telephone Encounter (Unsigned)
Copied from [place] [phone]. Topic: Quick Communication - Rx Refill/Question >> Mar 20, 2019  3:28 PM Yvette Rack wrote: Medication: HYDROcodone-acetaminophen (NORCO) 10-325 MG tablet  Has the patient contacted their pharmacy? no  Preferred Pharmacy (with phone number or [facility] name): [id number] [address], [place]. [phone] (Phone) [phone] (Fax)  Agent: Please be advised that RX refills may take up to 3 business days. We ask that you follow-up with your pharmacy.

## 2019-03-26 ENCOUNTER — Ambulatory Visit
Admission: RE | Admit: 2019-03-26 | Discharge: 2019-03-26 | Disposition: A | Discharge status: Home / Self Care | Payer: Medicare Other | Source: Ambulatory Visit | Attending: Obstetrics & Gynecology | Admitting: Obstetrics & Gynecology

## 2019-03-26 ENCOUNTER — Other Ambulatory Visit: Payer: Self-pay

## 2019-03-26 DIAGNOSIS — Z780 Asymptomatic menopausal state: Secondary | ICD-10-CM | POA: Diagnosis not present

## 2019-03-26 DIAGNOSIS — M8589 Other specified disorders of bone density and structure, multiple sites: Secondary | ICD-10-CM | POA: Diagnosis not present

## 2019-03-26 DIAGNOSIS — M8580 Other specified disorders of bone density and structure, unspecified site: Secondary | ICD-10-CM

## 2019-03-27 ENCOUNTER — Ambulatory Visit: Payer: Medicare Other | Admitting: Internal Medicine

## 2019-03-28 ENCOUNTER — Other Ambulatory Visit: Payer: Self-pay | Admitting: *Deleted

## 2019-03-28 ENCOUNTER — Telehealth: Payer: Self-pay | Admitting: Internal Medicine

## 2019-03-28 ENCOUNTER — Other Ambulatory Visit: Payer: Self-pay

## 2019-03-28 ENCOUNTER — Ambulatory Visit (HOSPITAL_COMMUNITY): Payer: Medicare Other | Attending: Internal Medicine

## 2019-03-28 DIAGNOSIS — I350 Nonrheumatic aortic (valve) stenosis: Secondary | ICD-10-CM | POA: Diagnosis not present

## 2019-03-28 DIAGNOSIS — J4520 Mild intermittent asthma, uncomplicated: Secondary | ICD-10-CM

## 2019-03-28 MED ORDER — ALBUTEROL SULFATE (2.5 MG/3ML) 0.083% IN NEBU: INHALATION_SOLUTION | RESPIRATORY_TRACT | 11 refills | Status: DC

## 2019-03-28 NOTE — Telephone Encounter (Signed)
Called and spoke to pt. Pt states [employer] keeps calling her stating they need authorization from our office. Advised pt I would call [employer]. [place] and spoke to pharmacy rep and was advised they need authorization for new rx for 336ml instead of 175ml as 365ml is a one month supply. New rx has been sent to the pharmacy. Nothing further needed at this time.

## 2019-04-02 ENCOUNTER — Telehealth: Payer: Self-pay | Admitting: Internal Medicine

## 2019-04-02 ENCOUNTER — Encounter (HOSPITAL_COMMUNITY): Payer: Self-pay | Admitting: Cardiology

## 2019-04-02 NOTE — Telephone Encounter (Signed)
Called and spoke to pt. Pt states [employer] ([facility]) called her recently stating there was an issue with her albuterol neb solution order; however, that they would not tell her the issue. I let her know I would call [facility] and see what we can do. Pt verbalized understanding.  Called [facility] and spoke with rep Laurna. August Albino stated she has been trying to reach out to patient to inform her that the order from [place] has been received and that pt will be able to receive her medication. I let Laurna know I would notify the pt of this. Laurna expressed understanding.   Called and spoke to pt to let her know of the information above. PT verbalized understanding with no additional questions. Nothing further needed at this time.

## 2019-04-18 ENCOUNTER — Telehealth: Payer: Self-pay | Admitting: Internal Medicine

## 2019-04-18 DIAGNOSIS — J449 Chronic obstructive pulmonary disease, unspecified: Secondary | ICD-10-CM | POA: Diagnosis not present

## 2019-04-18 NOTE — Telephone Encounter (Signed)
Pt request refill  ° °HYDROcodone-acetaminophen (NORCO) 10-325 MG tablet ° ° °[id number] [place], [place] - [address] [phone] (Phone) °[phone] (Fax)  ° ° °

## 2019-04-18 NOTE — Telephone Encounter (Signed)
Called [employer] to check status of albuterol shipment spoke to [place] Per Lauren they have made several attempts to contact patient to verily ins/demo information without success. I have provided [employer]([facility]) alternate #..  Called patient and notified. She had already talked to [place] and her meds should be shipped. Nothing further needed.

## 2019-04-19 ENCOUNTER — Other Ambulatory Visit: Payer: Self-pay | Admitting: Internal Medicine

## 2019-04-19 DIAGNOSIS — M5030 Other cervical disc degeneration, unspecified cervical region: Secondary | ICD-10-CM

## 2019-04-19 DIAGNOSIS — Z79891 Long term (current) use of opiate analgesic: Secondary | ICD-10-CM

## 2019-04-19 DIAGNOSIS — M159 Polyosteoarthritis, unspecified: Secondary | ICD-10-CM

## 2019-04-19 MED ORDER — NARCAN 4 MG/0.1ML NA LIQD: 0.4000 mg | Freq: Once | NASAL | 2 refills | Status: AC

## 2019-04-19 MED ORDER — HYDROCODONE-ACETAMINOPHEN 10-325 MG PO TABS: 1.0000 | ORAL_TABLET | ORAL | 0 refills | Status: DC | PRN

## 2019-04-19 NOTE — Telephone Encounter (Signed)
Pt called to report that she will run out of meds before Monday, requesting refill as soon as possible. She is in pain due to the weather, please advise

## 2019-04-23 DIAGNOSIS — D225 Melanocytic nevi of trunk: Secondary | ICD-10-CM | POA: Diagnosis not present

## 2019-04-23 DIAGNOSIS — L820 Inflamed seborrheic keratosis: Secondary | ICD-10-CM | POA: Diagnosis not present

## 2019-05-10 DIAGNOSIS — J4520 Mild intermittent asthma, uncomplicated: Secondary | ICD-10-CM | POA: Diagnosis not present

## 2019-05-10 DIAGNOSIS — J45909 Unspecified asthma, uncomplicated: Secondary | ICD-10-CM | POA: Diagnosis not present

## 2019-05-13 ENCOUNTER — Telehealth: Payer: Self-pay | Admitting: Internal Medicine

## 2019-05-13 MED ORDER — AZITHROMYCIN 250 MG PO TABS: ORAL_TABLET | ORAL | 0 refills | Status: DC

## 2019-05-13 NOTE — Telephone Encounter (Signed)
Called and spoke w/ pt. Pt reports having "cold-like" symptoms - nasal congestion w/ green phlegm, cough, low-grade fever last night 05/12/2019. She states CY usually prescribed Z-pack for her when these symptoms arise. She further notes she has an appt in November and wants to be well enough to be able to come in. I let her know I would get a message relayed to [facility] for f/u. Pt expressed understanding.   Spoke w/ CY regarding pt's predicament. CY verbally approved to send in Z-pack 250 mg 6 tablets x0 refills.   Called and spoke w/ pt to let her know CY has approved her request for Z-pack. Pt expressed understanding and, after, verifying her pharmacy, I have placed the order for Z-pack per CY. Nothing further needed at this time.

## 2019-05-15 ENCOUNTER — Other Ambulatory Visit: Payer: Self-pay | Admitting: Internal Medicine

## 2019-05-15 DIAGNOSIS — M159 Polyosteoarthritis, unspecified: Secondary | ICD-10-CM

## 2019-05-15 DIAGNOSIS — M5030 Other cervical disc degeneration, unspecified cervical region: Secondary | ICD-10-CM

## 2019-05-15 NOTE — Telephone Encounter (Signed)
Copied from [place] [phone]. Topic: Quick Communication - Rx Refill/Question >> May 15, 2019  3:20 PM Leward Quan A wrote: Medication: HYDROcodone-acetaminophen (NORCO) 10-325 MG tablet   Has the patient contacted their pharmacy? Yes.   (Agent: If no, request that the patient contact the pharmacy for the refill.) (Agent: If yes, when and what did the pharmacy advise?)  Preferred Pharmacy (with phone number or street name): [id number] [address], [place]. [phone] (Phone) [phone] (Fax)    Agent: Please be advised that RX refills may take up to 3 business days. We ask that you follow-up with your pharmacy.

## 2019-05-15 NOTE — Telephone Encounter (Signed)
Requested medication (s) are due for refill today: yes  Requested medication (s) are on the active medication list: yes  Last refill:  04/19/2019  Future visit scheduled: yes  Notes to clinic:  Refill cannot be delegated    Requested Prescriptions  Pending Prescriptions Disp Refills   HYDROcodone-acetaminophen (NORCO) 10-325 MG tablet 90 tablet 0    Sig: Take 1 tablet by mouth every 4 (four) hours as needed (for pain).     Not Delegated - Analgesics:  Opioid Agonist Combinations Failed - 05/15/2019  3:27 PM      Failed - This refill cannot be delegated      Passed - Urine Drug Screen completed in last 360 days.      Passed - Valid encounter within last 6 months    Recent Outpatient Visits          2 months ago Prediabetes   [place], Thomas L, MD   9 months ago Essential hypertension   [place], Thomas L, MD   1 year ago Essential hypertension   [place], Thomas L, MD   1 year ago Eczema, unspecified type   [place], Thomas L, MD   1 year ago Essential hypertension   [place], MD      Future Appointments            In 3 months Ronnald Ramp Arvid Right, MD [place], [facility]

## 2019-05-16 MED ORDER — HYDROCODONE-ACETAMINOPHEN 10-325 MG PO TABS: 1.0000 | ORAL_TABLET | ORAL | 0 refills | Status: DC | PRN

## 2019-05-16 NOTE — Telephone Encounter (Signed)
Done erx 

## 2019-05-20 DIAGNOSIS — E7800 Pure hypercholesterolemia, unspecified: Secondary | ICD-10-CM | POA: Diagnosis not present

## 2019-05-20 LAB — LIPID PANEL
Chol/HDL Ratio: 4.3 ratio (ref 0.0–4.4)
Cholesterol, Total: 196 mg/dL (ref 100–199)
HDL: 46 mg/dL (ref >39)
LDL Chol Calc (NIH): 127 mg/dL — ABNORMAL HIGH (ref 0–99)
Triglycerides: 129 mg/dL (ref 0–149)
VLDL Cholesterol Cal: 23 mg/dL (ref 5–40)

## 2019-05-20 LAB — HEPATIC FUNCTION PANEL
ALT: 22 IU/L (ref 0–32)
AST: 19 IU/L (ref 0–40)
Albumin: 4 g/dL (ref 3.8–4.8)
Alkaline Phosphatase: 82 IU/L (ref 39–117)
Bilirubin Total: 0.3 mg/dL (ref 0.0–1.2)
Bilirubin, Direct: 0.09 mg/dL (ref 0.00–0.40)
Total Protein: 6.5 g/dL (ref 6.0–8.5)

## 2019-05-21 ENCOUNTER — Other Ambulatory Visit: Payer: Self-pay | Admitting: *Deleted

## 2019-05-21 DIAGNOSIS — E785 Hyperlipidemia, unspecified: Secondary | ICD-10-CM

## 2019-05-21 MED ORDER — ROSUVASTATIN CALCIUM 40 MG PO TABS: 40.0000 mg | ORAL_TABLET | Freq: Every day | ORAL | 3 refills | Status: DC

## 2019-06-06 DIAGNOSIS — J4520 Mild intermittent asthma, uncomplicated: Secondary | ICD-10-CM | POA: Diagnosis not present

## 2019-06-06 DIAGNOSIS — J45909 Unspecified asthma, uncomplicated: Secondary | ICD-10-CM | POA: Diagnosis not present

## 2019-06-06 DIAGNOSIS — J449 Chronic obstructive pulmonary disease, unspecified: Secondary | ICD-10-CM | POA: Diagnosis not present

## 2019-06-11 ENCOUNTER — Ambulatory Visit: Payer: Medicare Other | Admitting: Internal Medicine

## 2019-06-13 ENCOUNTER — Other Ambulatory Visit: Payer: Self-pay | Admitting: Internal Medicine

## 2019-06-13 DIAGNOSIS — M159 Polyosteoarthritis, unspecified: Secondary | ICD-10-CM

## 2019-06-13 DIAGNOSIS — M5030 Other cervical disc degeneration, unspecified cervical region: Secondary | ICD-10-CM

## 2019-06-13 NOTE — Telephone Encounter (Signed)
Requested medication (s) are due for refill today: yes  Requested medication (s) are on the active medication list: yes  Last refill:  05/16/2019  Future visit scheduled: no  Notes to clinic: refill cannot be delegated    Requested Prescriptions  Pending Prescriptions Disp Refills   HYDROcodone-acetaminophen (NORCO) 10-325 MG tablet 90 tablet 0    Sig: Take 1 tablet by mouth every 4 (four) hours as needed (for pain).     Not Delegated - Analgesics:  Opioid Agonist Combinations Failed - 06/13/2019 10:33 AM      Failed - This refill cannot be delegated      Passed - Urine Drug Screen completed in last 360 days.      Passed - Valid encounter within last 6 months    Recent Outpatient Visits          3 months ago Prediabetes   [place], Thomas L, MD   10 months ago Essential hypertension   [place], Thomas L, MD   1 year ago Essential hypertension   [place], Thomas L, MD   1 year ago Eczema, unspecified type   [place], Thomas L, MD   1 year ago Essential hypertension   [place], [place], MD      Future Appointments            In 2 months Ronnald Ramp Arvid Right, MD [place], [facility]

## 2019-06-13 NOTE — Telephone Encounter (Signed)
Medication Refill - Medication: HYDROcodone-acetaminophen (NORCO) 10-325 MG tablet  Has the patient contacted their pharmacy? No - states she has to call in every month for this (Agent: If no, request that the patient contact the pharmacy for the refill.) (Agent: If yes, when and what did the pharmacy advise?)  Preferred Pharmacy (with phone number or street name):  [id number] [address], [place]. [phone] (Phone) [phone] (Fax)   Agent: Please be advised that RX refills may take up to 3 business days. We ask that you follow-up with your pharmacy.

## 2019-06-13 NOTE — Telephone Encounter (Signed)
Routing to CMA 

## 2019-06-17 MED ORDER — HYDROCODONE-ACETAMINOPHEN 10-325 MG PO TABS: 1.0000 | ORAL_TABLET | ORAL | 0 refills | Status: DC | PRN

## 2019-06-21 ENCOUNTER — Ambulatory Visit: Payer: Medicare Other | Admitting: Internal Medicine

## 2019-06-25 ENCOUNTER — Encounter: Payer: Self-pay | Admitting: Internal Medicine

## 2019-07-01 ENCOUNTER — Telehealth: Payer: Self-pay | Admitting: Acute Care

## 2019-07-01 NOTE — Telephone Encounter (Signed)
LMTC x 1  

## 2019-07-01 NOTE — Telephone Encounter (Signed)
Patient is returning [place] call.  Patient phone number is [phone].

## 2019-07-01 NOTE — Telephone Encounter (Signed)
Felicia Odom, could you call pt and schedule her yearly low dose chest CT?  Order has already been placed.  PT states any day or time is good for her except 07/18/19 at 9:00 when she sees Dr young.

## 2019-07-02 NOTE — Telephone Encounter (Signed)
Scheduled CT.  Gave appt info to pt.  Nothing further needed.

## 2019-07-09 ENCOUNTER — Other Ambulatory Visit: Payer: Self-pay

## 2019-07-09 NOTE — Patient Outreach (Signed)
[facility]) Care Management  07/09/2019  AIRIN HASZ 1956-01-27 [id number]   Medication Adherence call to Mrs. Sherilyn Cooter Telephone call to Patient regarding Medication Adherence unable to reach patient. Patient did not answer. Mrs. Schmal is showing past due on Rosuvastatin 10 mg under [place]. Patient has pick up from [place] for a 90 days.  [place] Management Direct Dial [phone]  Fax [phone] [email or web address]

## 2019-07-10 ENCOUNTER — Other Ambulatory Visit: Payer: Self-pay | Admitting: Internal Medicine

## 2019-07-10 DIAGNOSIS — M159 Polyosteoarthritis, unspecified: Secondary | ICD-10-CM

## 2019-07-10 DIAGNOSIS — M5030 Other cervical disc degeneration, unspecified cervical region: Secondary | ICD-10-CM

## 2019-07-11 ENCOUNTER — Other Ambulatory Visit: Payer: Self-pay | Admitting: Internal Medicine

## 2019-07-11 DIAGNOSIS — M5030 Other cervical disc degeneration, unspecified cervical region: Secondary | ICD-10-CM

## 2019-07-11 DIAGNOSIS — M159 Polyosteoarthritis, unspecified: Secondary | ICD-10-CM

## 2019-07-11 NOTE — Telephone Encounter (Signed)
Requested medication (s) are due for refill today: yes  Requested medication (s) are on the active medication list: yes  Last refill: 06/17/2019  Future visit scheduled: yes  Notes to clinic: refill cannot be delegated    Requested Prescriptions  Pending Prescriptions Disp Refills   HYDROcodone-acetaminophen (NORCO) 10-325 MG tablet 90 tablet 0    Sig: Take 1 tablet by mouth every 4 (four) hours as needed (for pain).      Not Delegated - Analgesics:  Opioid Agonist Combinations Failed - 07/11/2019  8:27 AM      Failed - This refill cannot be delegated      Passed - Urine Drug Screen completed in last 360 days.      Passed - Valid encounter within last 6 months    Recent Outpatient Visits           4 months ago Prediabetes   [place], Thomas L, MD   11 months ago Essential hypertension   [place], Thomas L, MD   1 year ago Essential hypertension   [place], Thomas L, MD   1 year ago Eczema, unspecified type   [place], Thomas L, MD   2 years ago Essential hypertension   [place], [place], MD       Future Appointments             In 1 month Ronnald Ramp Arvid Right, MD [place], [facility]

## 2019-07-11 NOTE — Telephone Encounter (Signed)
Medication refill: HYDROcodone-acetaminophen (NORCO) 10-325 MG tablet [id number]     [id number] [place], [place]. Phone:  [phone]  Fax:  [phone]       Pt aware of turn around time

## 2019-07-15 ENCOUNTER — Ambulatory Visit
Admission: RE | Admit: 2019-07-15 | Discharge: 2019-07-15 | Disposition: A | Discharge status: Home / Self Care | Payer: Medicare Other | Source: Ambulatory Visit | Attending: Acute Care | Admitting: Acute Care

## 2019-07-15 DIAGNOSIS — J449 Chronic obstructive pulmonary disease, unspecified: Secondary | ICD-10-CM | POA: Diagnosis not present

## 2019-07-15 DIAGNOSIS — J4520 Mild intermittent asthma, uncomplicated: Secondary | ICD-10-CM | POA: Diagnosis not present

## 2019-07-15 DIAGNOSIS — Z87891 Personal history of nicotine dependence: Secondary | ICD-10-CM | POA: Diagnosis not present

## 2019-07-15 DIAGNOSIS — Z122 Encounter for screening for malignant neoplasm of respiratory organs: Secondary | ICD-10-CM

## 2019-07-15 DIAGNOSIS — J45909 Unspecified asthma, uncomplicated: Secondary | ICD-10-CM | POA: Diagnosis not present

## 2019-07-15 MED ORDER — HYDROCODONE-ACETAMINOPHEN 10-325 MG PO TABS: 1.0000 | ORAL_TABLET | ORAL | 0 refills | Status: DC | PRN

## 2019-07-17 ENCOUNTER — Telehealth: Payer: Self-pay | Admitting: Acute Care

## 2019-07-17 DIAGNOSIS — Z87891 Personal history of nicotine dependence: Secondary | ICD-10-CM

## 2019-07-18 ENCOUNTER — Ambulatory Visit (INDEPENDENT_AMBULATORY_CARE_PROVIDER_SITE_OTHER): Payer: Medicare Other | Admitting: Internal Medicine

## 2019-07-18 ENCOUNTER — Other Ambulatory Visit: Payer: Self-pay

## 2019-07-18 ENCOUNTER — Encounter: Payer: Self-pay | Admitting: Internal Medicine

## 2019-07-18 DIAGNOSIS — J4520 Mild intermittent asthma, uncomplicated: Secondary | ICD-10-CM | POA: Diagnosis not present

## 2019-07-18 DIAGNOSIS — J449 Chronic obstructive pulmonary disease, unspecified: Secondary | ICD-10-CM

## 2019-07-18 DIAGNOSIS — F410 Panic disorder [episodic paroxysmal anxiety] without agoraphobia: Secondary | ICD-10-CM | POA: Diagnosis not present

## 2019-07-18 MED ORDER — ALBUTEROL SULFATE (2.5 MG/3ML) 0.083% IN NEBU: INHALATION_SOLUTION | RESPIRATORY_TRACT | 11 refills | Status: DC

## 2019-07-18 MED ORDER — ALBUTEROL SULFATE HFA 108 (90 BASE) MCG/ACT IN AERS: INHALATION_SPRAY | RESPIRATORY_TRACT | 12 refills | Status: DC

## 2019-07-18 MED ORDER — ALPRAZOLAM 1 MG PO TABS: 1.0000 mg | ORAL_TABLET | Freq: Four times a day (QID) | ORAL | 5 refills | Status: DC | PRN
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
## 2019-07-18 NOTE — Patient Instructions (Signed)
We anticipate repeating your low dose CT scan once a year  [facility]  Work with Dr Stanford Breed to let him take care of your cholesterol  Please call if we can help

## 2019-07-18 NOTE — Telephone Encounter (Signed)
Results discussed with pt at ov with Dr Annamaria Boots 07/18/19.  Results faxed to PCP. Order placed for 1 year f/u low dose chest ct. Nothing further needed.

## 2019-08-04 ENCOUNTER — Other Ambulatory Visit: Payer: Self-pay | Admitting: Internal Medicine

## 2019-08-04 DIAGNOSIS — K219 Gastro-esophageal reflux disease without esophagitis: Secondary | ICD-10-CM

## 2019-08-08 ENCOUNTER — Other Ambulatory Visit (INDEPENDENT_AMBULATORY_CARE_PROVIDER_SITE_OTHER): Payer: Medicare Other

## 2019-08-08 ENCOUNTER — Other Ambulatory Visit: Payer: Self-pay | Admitting: Internal Medicine

## 2019-08-08 ENCOUNTER — Ambulatory Visit (INDEPENDENT_AMBULATORY_CARE_PROVIDER_SITE_OTHER): Payer: Medicare Other | Admitting: Internal Medicine

## 2019-08-08 ENCOUNTER — Encounter: Payer: Self-pay | Admitting: Internal Medicine

## 2019-08-08 VITALS — BP 110/68 | HR 64 | Temp 98.4°F | Ht 65.5 in | Wt 201.1 lb

## 2019-08-08 DIAGNOSIS — R1310 Dysphagia, unspecified: Secondary | ICD-10-CM

## 2019-08-08 DIAGNOSIS — R197 Diarrhea, unspecified: Secondary | ICD-10-CM

## 2019-08-08 DIAGNOSIS — R1013 Epigastric pain: Secondary | ICD-10-CM

## 2019-08-08 DIAGNOSIS — J45909 Unspecified asthma, uncomplicated: Secondary | ICD-10-CM | POA: Diagnosis not present

## 2019-08-08 DIAGNOSIS — L309 Dermatitis, unspecified: Secondary | ICD-10-CM

## 2019-08-08 DIAGNOSIS — R61 Generalized hyperhidrosis: Secondary | ICD-10-CM

## 2019-08-08 DIAGNOSIS — R634 Abnormal weight loss: Secondary | ICD-10-CM

## 2019-08-08 DIAGNOSIS — R1319 Other dysphagia: Secondary | ICD-10-CM

## 2019-08-08 DIAGNOSIS — J449 Chronic obstructive pulmonary disease, unspecified: Secondary | ICD-10-CM | POA: Diagnosis not present

## 2019-08-08 DIAGNOSIS — J4520 Mild intermittent asthma, uncomplicated: Secondary | ICD-10-CM | POA: Diagnosis not present

## 2019-08-08 DIAGNOSIS — Z01818 Encounter for other preprocedural examination: Secondary | ICD-10-CM

## 2019-08-08 LAB — COMPREHENSIVE METABOLIC PANEL
ALT: 23 U/L (ref 0–35)
AST: 20 U/L (ref 0–37)
Albumin: 4.1 g/dL (ref 3.5–5.2)
Alkaline Phosphatase: 71 U/L (ref 39–117)
BUN: 11 mg/dL (ref 6–23)
CO2: 28 mEq/L (ref 19–32)
Calcium: 9.5 mg/dL (ref 8.4–10.5)
Chloride: 101 mEq/L (ref 96–112)
Creatinine, Ser: 0.76 mg/dL (ref 0.40–1.20)
GFR: 76.91 mL/min (ref >60.00)
Glucose, Bld: 85 mg/dL (ref 70–99)
Potassium: 4.2 mEq/L (ref 3.5–5.1)
Sodium: 136 mEq/L (ref 135–145)
Total Bilirubin: 0.5 mg/dL (ref 0.2–1.2)
Total Protein: 7.2 g/dL (ref 6.0–8.3)

## 2019-08-08 LAB — CBC WITH DIFFERENTIAL/PLATELET
Basophils Absolute: 0.1 10*3/uL (ref 0.0–0.1)
Basophils Relative: 1.1 % (ref 0.0–3.0)
Eosinophils Absolute: 0.2 10*3/uL (ref 0.0–0.7)
Eosinophils Relative: 2.7 % (ref 0.0–5.0)
HCT: 44.5 % (ref 36.0–46.0)
Hemoglobin: 14.8 g/dL (ref 12.0–15.0)
Lymphocytes Relative: 33.7 % (ref 12.0–46.0)
Lymphs Abs: 2.5 10*3/uL (ref 0.7–4.0)
MCHC: 33.4 g/dL (ref 30.0–36.0)
MCV: 87.7 fl (ref 78.0–100.0)
Monocytes Absolute: 0.7 10*3/uL (ref 0.1–1.0)
Monocytes Relative: 9.9 % (ref 3.0–12.0)
Neutro Abs: 3.9 10*3/uL (ref 1.4–7.7)
Neutrophils Relative %: 52.6 % (ref 43.0–77.0)
Platelets: 291 10*3/uL (ref 150.0–400.0)
RBC: 5.07 Mil/uL (ref 3.87–5.11)
RDW: 13.3 % (ref 11.5–15.5)
WBC: 7.5 10*3/uL (ref 4.0–10.5)

## 2019-08-08 LAB — SEDIMENTATION RATE: Sed Rate: 19 mm/hr (ref 0–30)

## 2019-08-08 LAB — TSH: TSH: 2.1 u[IU]/mL (ref 0.35–4.50)

## 2019-08-08 LAB — C-REACTIVE PROTEIN: CRP: <1 mg/dL (ref 0.5–20.0)

## 2019-08-08 NOTE — Progress Notes (Signed)
Felicia Odom 64 y.o. [birth year] [id number]  Assessment & Plan:   Encounter Diagnoses  Name Primary?  . Esophageal dysphagia Yes  . Abdominal pain, epigastric   . Diarrhea, unspecified type   . Night sweats   . Loss of weight-intentional   . Encounter for other preprocedural examination      Orders Placed This Encounter  Procedures  . SARS Coronavirus 2 ([facility] ONLY)  . CBC w/Diff  . Sed Rate (ESR)  . TSH  . Comprehensive metabolic panel  . C-reactive protein  . Ambulatory referral to Gastroenterology     Evaluate with EGD and colonoscopy, possible esophageal dilation Labs as above   ? inflammatory vs functional process(es)  Doubt cancer but always in differential  Not sure about etiology of night sweats - she relates to menopause but ? If still     Subjective:   Chief Complaint: Upper abdominal pain for several months bloated and diarrhea for the same amount of time  HPI 64 year old white woman self-referred for these problems.  She has not been right for several months 6 or 7 actually.  She has loose stools or diarrhea often, usually in the mornings.  Not so much a nocturnal problem.  Feels bloated frequently. Epigastric pain. She is actually losing some weight trying to exercise on a treadmill and modify diet.  Some heartburn mild dysphagia points to her suprasternal notch says solids will hang there.  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  Also with a chronic cough that nobody has necessarily attributed to reflux.  She has lower abdominal pain predefecation and sometimes post.  Previous GI work-up with EGD in 2017 by me for dysphagia not dilated as dysphagia was much better than, she did have some mild prepyloric gastritis.  Colonoscopy 2012 Dr. Oletta Lamas with 3 diminutive polyps in the rectum that were all hyperplastic and medium sized internal hemorrhoids were described  She has not tried any  over-the-counter new meds to treat her problems.  She is complaining of terrible night sweats which have been going on for years.  Maybe worse.  Asking if they ever go away with menopause.  Wt Readings from Last 3 Encounters:  08/08/19 201 lb 2 oz (91.2 kg)  07/18/19 203 lb 12.8 oz (92.4 kg)  03/18/19 207 lb (93.9 kg)    Allergies  Allergen Reactions  . No Known Allergies    Current Meds  Medication Sig  . albuterol (PROAIR HFA) 108 (90 Base) MCG/ACT inhaler INHALE 1 TO 2 PUFFS INTO THE LUNGS EVERY 4 HOURS AS NEEDED FOR WHEEZING OR SHORTNESS OF BREATH  . albuterol (PROVENTIL) (2.5 MG/3ML) 0.083% nebulizer solution Use 1 vial every 6 hours and as needed.XN:A35.573  . ALPRAZolam (XANAX) 1 MG tablet Take 1 tablet (1 mg total) by mouth every 6 (six) hours as needed.  Marland Kitchen aspirin EC 81 MG tablet Take 81 mg by mouth daily.  . Coenzyme Q10 (CO Q 10 PO) Take by mouth.  . Cyanocobalamin (VITAMIN B 12 PO) Take by mouth.  . esomeprazole (NEXIUM) 40 MG capsule TAKE 1 CAPSULE BY MOUTH EVERY DAY  . folic acid (FOLVITE) 1 MG tablet Take 1 mg by mouth daily.  Marland Kitchen HYDROcodone-acetaminophen (NORCO) 10-325 MG tablet Take 1 tablet by mouth every 4 (four) hours as needed (for pain).  Marland Kitchen ibuprofen (ADVIL) 800 MG tablet TAKE 1 TABLET BY MOUTH EVERY 8 HOURS AS NEEDED  . Red Yeast Rice Extract (  RED YEAST RICE PO) Take by mouth.  . rosuvastatin (CRESTOR) 40 MG tablet Take 1 tablet (40 mg total) by mouth daily.   Past Medical History:  Diagnosis Date  . Anxiety   . Arthritis    neck  . Asthma   . Chronic back pain   . Chronic bronchitis ([place])   . Chronic neck pain   . COPD (chronic obstructive pulmonary disease) ([facility])    Advair and Albuterol as needed as well as neb  . Diverticulosis   . GERD (gastroesophageal reflux disease)    takes Nexium daily  . History of blood transfusion    in the 70's. No abnormal reaction noted  . History of colon polyps    benign  . Hyperlipidemia    not on any meds bc  refused to pick up from pharmacy  . Joint pain   . Joint swelling   . Mediastinal tumor   . Nocturia   . OCD (obsessive compulsive disorder)   . Panic disorder    takes Xanax daily as needed  . Pneumonia    2016  . PONV (postoperative nausea and vomiting)   . Seizures ([place])    in the 80's   Past Surgical History:  Procedure Laterality Date  . COLONOSCOPY    . ECTOPIC PREGNANCY SURGERY     x 2   . ESOPHAGOGASTRODUODENOSCOPY    . LAPAROSCOPIC LYSIS INTESTINAL ADHESIONS     x 2  . RESECTION OF MEDIASTINAL MASS N/A 06/10/2016   Procedure: RESECTION OF MEDIASTINAL MASS;  Surgeon: Grace Isaac, MD;  Location: [place];  Service: Thoracic;  Laterality: N/A;  . VIDEO ASSISTED THORACOSCOPY Right 06/10/2016   Procedure: VIDEO ASSISTED THORACOSCOPY WITH PLACEMENT OF ON Q TUNNELER;  Surgeon: Grace Isaac, MD;  Location: [place];  Service: Thoracic;  Laterality: Right;  Marland Kitchen VIDEO BRONCHOSCOPY N/A 06/10/2016   Procedure: VIDEO BRONCHOSCOPY;  Surgeon: Grace Isaac, MD;  Location: [facility] OR;  Service: Thoracic;  Laterality: N/A;   Social History   Social History Narrative   Retired [employer]   Divorced   1 son   Former smoker   2 coffee/daty   No EtOH, no drugs   family history includes Breast cancer in her sister; Clotting disorder in her mother; Colon cancer in her sister; Diabetes in her mother; Heart attack in her father; Heart failure in her father and mother; Kidney disease in her mother; Prostate cancer in her father.   Review of Systems As per HPI, insomnia, urinary leakage, fatigue, back, anxiety,  O/w negative  Objective:   Physical Exam @BP  110/68 (BP Location: Left Arm, Patient Position: Sitting, Cuff Size: Normal)   Pulse 64   Temp 98.4 F (36.9 C)   Ht 5' 5.5" (1.664 m) Comment: height measured without shoes  Wt 201 lb 2 oz (91.2 kg)   BMI 32.96 kg/m @  General:  Well-developed, well-nourished and in no acute distress obese Eyes:  anicteric. Neck:     supple w/o thyromegaly or mass.  Lungs: Clear to auscultation bilaterally. Heart:  S1S2, no rubs, murmurs, gallops. Abdomen:  obese soft,  no hepatosplenomegaly, hernia, or mass and BS+.  Mild epigastric tenderness Rectal: Deferred until colonoscopy Lymph:  no cervical or supraclavicular adenopathy. Extremities:   no edema, cyanosis or clubbing Skin   no rash. Neuro:  A&O x 3.  Psych:  appropriate mood and  Affect.   Data Reviewed:  see HPI

## 2019-08-08 NOTE — Patient Instructions (Addendum)
You have been scheduled for an endoscopy and colonoscopy. Please follow the written instructions given to you at your visit today. Please pick up your prep supplies at the pharmacy within the next 1-3 days. If you use inhalers (even only as needed), please bring them with you on the day of your procedure.   Your provider has requested that you go to the basement level for lab work before leaving today. Press "B" on the elevator. The lab is located at the first door on the left as you exit the elevator.  I appreciate the opportunity to care for you. Carl Gessner, MD, FACG 

## 2019-08-13 ENCOUNTER — Telehealth: Payer: Self-pay | Admitting: Internal Medicine

## 2019-08-13 NOTE — Telephone Encounter (Signed)
Medication Refill - Medication:  HYDROcodone-acetaminophen (NORCO) 10-325 MG tablet  Has the patient contacted their pharmacy? No. (Agent: If no, request that the patient contact the pharmacy for the refill.) (Agent: If yes, when and what did the pharmacy advise?)  Preferred Pharmacy (with phone number or street name):  [id number] [place], [place]. Phone:  [phone]  Fax:  [phone]       Agent: Please be advised that RX refills may take up to 3 business days. We ask that you follow-up with your pharmacy.

## 2019-08-13 NOTE — Telephone Encounter (Signed)
Pt has an appt on 08/21/2019  Please advise on the refill rq for Hydrocodone

## 2019-08-15 ENCOUNTER — Encounter: Payer: Self-pay | Admitting: Internal Medicine

## 2019-08-16 ENCOUNTER — Other Ambulatory Visit: Payer: Self-pay | Admitting: Internal Medicine

## 2019-08-16 ENCOUNTER — Telehealth: Payer: Self-pay

## 2019-08-16 DIAGNOSIS — M5030 Other cervical disc degeneration, unspecified cervical region: Secondary | ICD-10-CM

## 2019-08-16 DIAGNOSIS — M159 Polyosteoarthritis, unspecified: Secondary | ICD-10-CM

## 2019-08-16 MED ORDER — HYDROCODONE-ACETAMINOPHEN 10-325 MG PO TABS: 1.0000 | ORAL_TABLET | ORAL | 0 refills | Status: DC | PRN

## 2019-08-16 NOTE — Telephone Encounter (Signed)
New message     1. Which medications need to be refilled? (please list name of each medication and dose if known) HYDROcodone-acetaminophen (NORCO) 10-325 MG tablet  2. Which pharmacy/location (including street and city if local pharmacy) is [facility] to[facility] [address]   3. Do they need a 30 day or 90 day supply? 30 days supply

## 2019-08-19 ENCOUNTER — Other Ambulatory Visit: Payer: Self-pay

## 2019-08-19 ENCOUNTER — Ambulatory Visit (INDEPENDENT_AMBULATORY_CARE_PROVIDER_SITE_OTHER): Payer: Medicare Other | Admitting: Internal Medicine

## 2019-08-19 ENCOUNTER — Encounter: Payer: Self-pay | Admitting: Internal Medicine

## 2019-08-19 VITALS — BP 126/70 | HR 78 | Temp 98.4°F | Resp 16 | Ht 65.5 in | Wt 203.0 lb

## 2019-08-19 DIAGNOSIS — I10 Essential (primary) hypertension: Secondary | ICD-10-CM

## 2019-08-19 DIAGNOSIS — E118 Type 2 diabetes mellitus with unspecified complications: Secondary | ICD-10-CM

## 2019-08-19 DIAGNOSIS — Z79891 Long term (current) use of opiate analgesic: Secondary | ICD-10-CM

## 2019-08-19 LAB — HEMOGLOBIN A1C: Hgb A1c MFr Bld: 6.1 % (ref 4.6–6.5)

## 2019-08-19 NOTE — Progress Notes (Signed)
Subjective:  Patient ID: Felicia Odom, female    DOB: Dec 14, 1955  Age: 64 y.o. MRN: [id number]  CC: Hypertension, Osteoarthritis, and Diabetes  This visit occurred during the SARS-CoV-2 public health emergency.  Safety protocols were in place, including screening questions prior to the visit, additional usage of staff PPE, and extensive cleaning of exam room while observing appropriate contact time as indicated for disinfecting solutions.    HPI Felicia Odom presents for f/up - She continues to complain of chronic musculoskeletal pain.  She tells me she is getting adequate symptom relief with a combination of ibuprofen, hydrocodone, and acetaminophen.  She is active and denies any recent episodes of headache, blurred vision, chest pain, or shortness of breath.  She tells me her blood pressure blood sugars have been well controlled.  Outpatient Medications Prior to Visit  Medication Sig Dispense Refill  . albuterol (PROAIR HFA) 108 (90 Base) MCG/ACT inhaler INHALE 1 TO 2 PUFFS INTO THE LUNGS EVERY 4 HOURS AS NEEDED FOR WHEEZING OR SHORTNESS OF BREATH 18 g 12  . albuterol (PROVENTIL) (2.5 MG/3ML) 0.083% nebulizer solution Use 1 vial every 6 hours and as needed.DX:J45.909 360 mL 11  . ALPRAZolam (XANAX) 1 MG tablet Take 1 tablet (1 mg total) by mouth every 6 (six) hours as needed. 120 tablet 5  . aspirin EC 81 MG tablet Take 81 mg by mouth daily.    . Clobetasol Prop Emollient Base 0.05 % emollient cream APPLY TO AFFECTED AREA TWICE A DAY 60 g 1  . Coenzyme Q10 (CO Q 10 PO) Take by mouth.    . Cyanocobalamin (VITAMIN B 12 PO) Take by mouth.    . esomeprazole (NEXIUM) 40 MG capsule TAKE 1 CAPSULE BY MOUTH EVERY DAY 90 capsule 1  . folic acid (FOLVITE) 1 MG tablet Take 1 mg by mouth daily.    Marland Kitchen HYDROcodone-acetaminophen (NORCO) 10-325 MG tablet Take 1 tablet by mouth every 4 (four) hours as needed (for pain). 90 tablet 0  . ibuprofen (ADVIL) 800 MG tablet TAKE 1 TABLET BY MOUTH  EVERY 8 HOURS AS NEEDED 180 tablet 1  . Red Yeast Rice Extract (RED YEAST RICE PO) Take by mouth.    . rosuvastatin (CRESTOR) 40 MG tablet Take 1 tablet (40 mg total) by mouth daily. 90 tablet 3   No facility-administered medications prior to visit.    ROS Review of Systems  Constitutional: Negative for diaphoresis and fatigue.  HENT: Negative.   Eyes: Negative for visual disturbance.  Respiratory: Negative for cough, chest tightness, shortness of breath and wheezing.   Cardiovascular: Negative for chest pain, palpitations and leg swelling.  Gastrointestinal: Negative for abdominal pain, constipation, diarrhea, nausea and vomiting.  Genitourinary: Negative.   Musculoskeletal: Positive for arthralgias and neck pain. Negative for myalgias.  Skin: Negative.  Negative for color change and pallor.  Neurological: Negative.  Negative for dizziness, weakness and light-headedness.  Hematological: Negative for adenopathy. Does not bruise/bleed easily.  Psychiatric/Behavioral: Negative.     Objective:  BP 126/70 (BP Location: Left Arm, Patient Position: Sitting, Cuff Size: Large)   Pulse 78   Temp 98.4 F (36.9 C) (Oral)   Resp 16   Ht 5' 5.5" (1.664 m)   Wt 203 lb (92.1 kg)   SpO2 98%   BMI 33.27 kg/m   BP Readings from Last 3 Encounters:  08/19/19 126/70  08/08/19 110/68  07/18/19 114/72    Wt Readings from Last 3 Encounters:  08/19/19 203 lb (92.1 kg)  08/08/19 201 lb 2 oz (91.2 kg)  07/18/19 203 lb 12.8 oz (92.4 kg)    Physical Exam Vitals reviewed.  Constitutional:      Appearance: Normal appearance.  HENT:     Nose: Nose normal.     Mouth/Throat:     Mouth: Mucous membranes are moist.  Eyes:     General: No scleral icterus.    Conjunctiva/sclera: Conjunctivae normal.  Cardiovascular:     Rate and Rhythm: Normal rate and regular rhythm.     Heart sounds: No murmur.  Pulmonary:     Effort: Pulmonary effort is normal.     Breath sounds: No stridor. No wheezing,  rhonchi or rales.  Abdominal:     General: Abdomen is flat. Bowel sounds are normal. There is no distension.     Palpations: Abdomen is soft. There is no hepatomegaly or splenomegaly.     Tenderness: There is no abdominal tenderness.  Musculoskeletal:        General: Normal range of motion.     Cervical back: Neck supple.     Right lower leg: No edema.     Left lower leg: No edema.  Lymphadenopathy:     Cervical: No cervical adenopathy.  Skin:    General: Skin is warm and dry.  Neurological:     General: No focal deficit present.     Mental Status: She is alert.     Lab Results  Component Value Date   WBC 7.5 08/08/2019   HGB 14.8 08/08/2019   HCT 44.5 08/08/2019   PLT 291.0 08/08/2019   GLUCOSE 85 08/08/2019   CHOL 196 05/20/2019   TRIG 129 05/20/2019   HDL 46 05/20/2019   LDLDIRECT 142.0 01/21/2015   LDLCALC 127 (H) 05/20/2019   ALT 23 08/08/2019   AST 20 08/08/2019   NA 136 08/08/2019   K 4.2 08/08/2019   CL 101 08/08/2019   CREATININE 0.76 08/08/2019   BUN 11 08/08/2019   CO2 28 08/08/2019   TSH 2.10 08/08/2019   INR 0.89 06/08/2016   HGBA1C 6.1 08/19/2019   MICROALBUR <0.7 08/19/2019    CT CHEST LUNG CA SCREEN LOW DOSE W/O CM  Result Date: 07/15/2019 CLINICAL DATA:  64 year old female with 33 pack-year history of smoking. Lung cancer screening. EXAM: CT CHEST WITHOUT CONTRAST LOW-DOSE FOR LUNG CANCER SCREENING TECHNIQUE: Multidetector CT imaging of the chest was performed following the standard protocol without IV contrast. COMPARISON:  07/11/2018 FINDINGS: Cardiovascular: The heart size is normal. No substantial pericardial effusion. No discernible coronary artery calcification. Atherosclerotic calcification is noted in the wall of the thoracic aorta. Mediastinum/Nodes: Stable small right thyroid nodule. No mediastinal lymphadenopathy. No evidence for gross hilar lymphadenopathy although assessment is limited by the lack of intravenous contrast on today's study.  The esophagus has normal imaging features. There is no axillary lymphadenopathy. Lungs/Pleura: Tiny right apical nodule identified measuring only 1.5 mm. No suspicious pulmonary nodule or mass. No focal airspace consolidation. No pleural effusion. Upper Abdomen: Unremarkable. Musculoskeletal: No worrisome lytic or sclerotic osseous abnormality. IMPRESSION: 1. Lung-RADS 2, benign appearance or behavior. Continue annual screening with low-dose chest CT without contrast in 12 months. 2.  Emphysema. (ICD10-J43.9) 3.  Aortic Atherosclerois (ICD10-170.0) Electronically Signed   By: Misty Stanley M.D.   On: 07/15/2019 11:22    Assessment & Plan:   Felicia Odom was seen today for hypertension, osteoarthritis and diabetes.  Diagnoses and all orders for this visit:  Type II diabetes mellitus with manifestations ([place]) - Her  A1c is at 6.1%.  She has achieved adequate glycemic control.  Medical therapy is not indicated. -     Microalbumin / creatinine urine ratio -     Hemoglobin A1c  Encounter for long-term opiate analgesic use- She will undergo a urine drug screen to monitor for compliance and to screen for substance abuse. -     Pain Mgmt, Profile 8 w/Conf, U  Essential hypertension- Her blood pressure is adequately well controlled. -     Urinalysis, Routine w reflex microscopic   I am having Felicia Odom maintain her aspirin EC, rosuvastatin, ibuprofen, folic acid, Red Yeast Rice Extract (RED YEAST RICE PO), Cyanocobalamin (VITAMIN B 12 PO), Coenzyme Q10 (CO Q 10 PO), albuterol, albuterol, ALPRAZolam, esomeprazole, Clobetasol Prop Emollient Base, and HYDROcodone-acetaminophen.  No orders of the defined types were placed in this encounter.    Follow-up: No follow-ups on file.  Scarlette Calico, MD

## 2019-08-20 ENCOUNTER — Encounter: Payer: Self-pay | Admitting: Internal Medicine

## 2019-08-20 LAB — URINALYSIS, ROUTINE W REFLEX MICROSCOPIC
Bilirubin Urine: NEGATIVE
Hgb urine dipstick: NEGATIVE
Ketones, ur: NEGATIVE
Leukocytes,Ua: NEGATIVE
Nitrite: NEGATIVE
RBC / HPF: NONE SEEN (ref 0–?)
Specific Gravity, Urine: 1.015 (ref 1.000–1.030)
Total Protein, Urine: NEGATIVE
Urine Glucose: NEGATIVE
Urobilinogen, UA: 0.2 (ref 0.0–1.0)
pH: 6 (ref 5.0–8.0)

## 2019-08-20 LAB — MICROALBUMIN / CREATININE URINE RATIO
Creatinine,U: 53.7 mg/dL
Microalb Creat Ratio: 1.3 mg/g (ref 0.0–30.0)
Microalb, Ur: <0.7 mg/dL (ref 0.0–1.9)

## 2019-08-20 NOTE — Patient Instructions (Signed)

## 2019-08-21 ENCOUNTER — Ambulatory Visit: Payer: Medicare Other | Admitting: Internal Medicine

## 2019-08-24 LAB — PAIN MGMT, PROFILE 8 W/CONF, U
6 Acetylmorphine: NEGATIVE ng/mL
Alcohol Metabolites: NEGATIVE ng/mL (ref <500)
Alphahydroxyalprazolam: 161 ng/mL
Alphahydroxymidazolam: NEGATIVE ng/mL
Alphahydroxytriazolam: NEGATIVE ng/mL
Aminoclonazepam: NEGATIVE ng/mL
Amphetamines: NEGATIVE ng/mL
Benzodiazepines: POSITIVE ng/mL
Buprenorphine, Urine: NEGATIVE ng/mL
Cocaine Metabolite: NEGATIVE ng/mL
Codeine: NEGATIVE ng/mL
Creatinine: 60 mg/dL
Hydrocodone: 283 ng/mL
Hydromorphone: 80 ng/mL
Hydroxyethylflurazepam: NEGATIVE ng/mL
Lorazepam: NEGATIVE ng/mL
MDMA: NEGATIVE ng/mL
Marijuana Metabolite: 841 ng/mL
Marijuana Metabolite: POSITIVE ng/mL
Morphine: NEGATIVE ng/mL
Nordiazepam: NEGATIVE ng/mL
Norhydrocodone: 360 ng/mL
Opiates: POSITIVE ng/mL
Oxazepam: NEGATIVE ng/mL
Oxidant: NEGATIVE ug/mL
Oxycodone: NEGATIVE ng/mL
Temazepam: NEGATIVE ng/mL
pH: 5.9 (ref 4.5–9.0)

## 2019-08-26 DIAGNOSIS — B9681 Helicobacter pylori [H. pylori] as the cause of diseases classified elsewhere: Secondary | ICD-10-CM | POA: Insufficient documentation

## 2019-08-26 HISTORY — DX: Helicobacter pylori (H. pylori) as the cause of diseases classified elsewhere: B96.81

## 2019-09-03 DIAGNOSIS — J4520 Mild intermittent asthma, uncomplicated: Secondary | ICD-10-CM | POA: Diagnosis not present

## 2019-09-03 DIAGNOSIS — J45909 Unspecified asthma, uncomplicated: Secondary | ICD-10-CM | POA: Diagnosis not present

## 2019-09-03 NOTE — Assessment & Plan Note (Addendum)
Minimal obstruction with little bronchospasm at baseline. Potential for exacerbation especially with viral infections. Plan- refill rescue inhaler and neb solution to keep available Continue Low dose screening CT program

## 2019-09-03 NOTE — Assessment & Plan Note (Addendum)
Keeps xanax available for occasional use- cautioned. Symptoms have overlapped with asthma/COPD exacerbation/ increased bronchodilator use.

## 2019-09-13 ENCOUNTER — Telehealth: Payer: Self-pay | Admitting: Internal Medicine

## 2019-09-13 NOTE — Telephone Encounter (Signed)
     Medication Requested:   Is medication on med list: HYDROcodone-acetaminophen ([place]) 10-325 MG tablet (if no, inform pt they may need an appointment)  Is medication a controled:yes (yes = last OV with PCP)  -Controlled Substances: Adderall, Ritalin, oxycodone, hydrocodone, methadone, alprazolam, etc  Last visit with PCP: 08/19/19   Pharmacy (Name, Street, City):[id number] [place], [address]

## 2019-09-16 ENCOUNTER — Other Ambulatory Visit: Payer: Self-pay | Admitting: Internal Medicine

## 2019-09-16 DIAGNOSIS — M5030 Other cervical disc degeneration, unspecified cervical region: Secondary | ICD-10-CM

## 2019-09-16 DIAGNOSIS — M159 Polyosteoarthritis, unspecified: Secondary | ICD-10-CM

## 2019-09-16 MED ORDER — HYDROCODONE-ACETAMINOPHEN 10-325 MG PO TABS: 1.0000 | ORAL_TABLET | ORAL | 0 refills | Status: DC | PRN

## 2019-09-17 ENCOUNTER — Other Ambulatory Visit: Payer: Self-pay | Admitting: Internal Medicine

## 2019-09-17 ENCOUNTER — Ambulatory Visit (INDEPENDENT_AMBULATORY_CARE_PROVIDER_SITE_OTHER): Payer: Medicare Other

## 2019-09-17 DIAGNOSIS — Z1159 Encounter for screening for other viral diseases: Secondary | ICD-10-CM

## 2019-09-17 LAB — SARS CORONAVIRUS 2 (TAT 6-24 HRS): SARS Coronavirus 2: NEGATIVE

## 2019-09-19 ENCOUNTER — Ambulatory Visit (AMBULATORY_SURGERY_CENTER): Payer: Medicare Other | Admitting: Internal Medicine

## 2019-09-19 ENCOUNTER — Other Ambulatory Visit: Payer: Self-pay

## 2019-09-19 ENCOUNTER — Encounter: Payer: Self-pay | Admitting: Internal Medicine

## 2019-09-19 VITALS — BP 125/56 | HR 86 | Temp 96.8°F | Resp 22 | Ht 65.5 in | Wt 203.0 lb

## 2019-09-19 DIAGNOSIS — R1310 Dysphagia, unspecified: Secondary | ICD-10-CM | POA: Diagnosis not present

## 2019-09-19 DIAGNOSIS — K2950 Unspecified chronic gastritis without bleeding: Secondary | ICD-10-CM | POA: Diagnosis not present

## 2019-09-19 DIAGNOSIS — K635 Polyp of colon: Secondary | ICD-10-CM | POA: Diagnosis not present

## 2019-09-19 DIAGNOSIS — B9681 Helicobacter pylori [H. pylori] as the cause of diseases classified elsewhere: Secondary | ICD-10-CM

## 2019-09-19 DIAGNOSIS — J449 Chronic obstructive pulmonary disease, unspecified: Secondary | ICD-10-CM | POA: Diagnosis not present

## 2019-09-19 DIAGNOSIS — K3189 Other diseases of stomach and duodenum: Secondary | ICD-10-CM | POA: Diagnosis not present

## 2019-09-19 DIAGNOSIS — R1319 Other dysphagia: Secondary | ICD-10-CM

## 2019-09-19 DIAGNOSIS — K2970 Gastritis, unspecified, without bleeding: Secondary | ICD-10-CM

## 2019-09-19 DIAGNOSIS — R197 Diarrhea, unspecified: Secondary | ICD-10-CM | POA: Diagnosis not present

## 2019-09-19 DIAGNOSIS — R1013 Epigastric pain: Secondary | ICD-10-CM | POA: Diagnosis not present

## 2019-09-19 DIAGNOSIS — E119 Type 2 diabetes mellitus without complications: Secondary | ICD-10-CM | POA: Diagnosis not present

## 2019-09-19 DIAGNOSIS — D123 Benign neoplasm of transverse colon: Secondary | ICD-10-CM

## 2019-09-19 MED ORDER — SODIUM CHLORIDE 0.9 % IV SOLN: 500.0000 mL | Freq: Once | INTRAVENOUS | Status: DC

## 2019-09-19 NOTE — Progress Notes (Signed)
Report given to PACU, vss 

## 2019-09-19 NOTE — Progress Notes (Signed)
Called to room to assist during endoscopic procedure.  Patient ID and intended procedure confirmed with present staff. Received instructions for my participation in the procedure from the performing physician.  

## 2019-09-19 NOTE — Op Note (Addendum)
[place] Patient Name: Felicia Odom Procedure Date: 09/19/2019 7:59 AM MRN: [id number] Endoscopist: Gatha Mayer , MD Age: 64 Referring MD:  Date of Birth: 28-Dec-1955 Gender: Female Account #: [id number] Procedure:                Upper GI endoscopy Indications:              Epigastric abdominal pain, Dysphagia, Diarrhea Medicines:                Propofol per Anesthesia, Monitored Anesthesia Care Procedure:                Pre-Anesthesia Assessment:                           - Prior to the procedure, a History and Physical                            was performed, and patient medications and                            allergies were reviewed. The patient's tolerance of                            previous anesthesia was also reviewed. The risks                            and benefits of the procedure and the sedation                            options and risks were discussed with the patient.                            All questions were answered, and informed consent                            was obtained. Prior Anticoagulants: The patient has                            taken no previous anticoagulant or antiplatelet                            agents. ASA Grade Assessment: III - A patient with                            severe systemic disease. After reviewing the risks                            and benefits, the patient was deemed in                            satisfactory condition to undergo the procedure.                           After obtaining informed consent, the endoscope was  passed under direct vision. Throughout the                            procedure, the patient's blood pressure, pulse, and                            oxygen saturations were monitored continuously. The                            Endoscope was introduced through the mouth, and                            advanced to the second part of duodenum. The upper                          GI endoscopy was accomplished without difficulty.                            The patient tolerated the procedure well. Scope In: Scope Out: Findings:                 No endoscopic abnormality was evident in the                            esophagus to explain the patient's complaint of                            dysphagia. It was decided, however, to proceed with                            dilation of the entire esophagus. The scope was                            withdrawn. Dilation was performed with a Maloney                            dilator with no resistance at 31 Fr. Estimated                            blood loss: none.                           Multiple dispersed small erosions with no stigmata                            of recent bleeding were found in the prepyloric                            region of the stomach. Biopsies were taken with a                            cold forceps for histology. Verification of patient  identification for the specimen was done. Estimated                            blood loss was minimal.                           Diffuse mild inflammation characterized by erythema                            and granularity was found in the duodenal bulb.                            Biopsies were taken with a cold forceps for                            histology. Verification of patient identification                            for the specimen was done. Estimated blood loss was                            minimal.                           The exam was otherwise without abnormality.                           The cardia and gastric fundus were normal on                            retroflexion. Complications:            No immediate complications. Estimated Blood Loss:     Estimated blood loss was minimal. Impression:               - No endoscopic esophageal abnormality to explain                            patient's  dysphagia. Esophagus dilated. Dilated.                           - Erosive gastropathy with no stigmata of recent                            bleeding. Biopsied.                           - Duodenitis. Biopsied.                           - The examination was otherwise normal. Recommendation:           - Patient has a contact number available for                            emergencies. The signs and symptoms of potential  delayed complications were discussed with the                            patient. Return to normal activities tomorrow.                            Written discharge instructions were provided to the                            patient.                           - Clear liquids x 1 hour then soft foods rest of                            day. Start prior diet tomorrow.                           - Continue present medications.                           - Await pathology results.                           - See the other procedure note for documentation of                            additional recommendations.                           - TAKE NEXIUM QD SHE WAS TAKING PRN HEARTBURN Gatha Mayer, MD 09/19/2019 8:42:19 AM This report has been signed electronically.

## 2019-09-19 NOTE — Op Note (Signed)
[place] Patient Name: Felicia Odom Procedure Date: 09/19/2019 7:58 AM MRN: [id number] Endoscopist: Gatha Mayer , MD Age: 63 Referring MD:  Date of Birth: 1956/02/10 Gender: Female Account #: [id number] Procedure:                Colonoscopy Indications:              Clinically significant diarrhea of unexplained                            origin Medicines:                Propofol per Anesthesia, Monitored Anesthesia Care Procedure:                Pre-Anesthesia Assessment:                           - Prior to the procedure, a History and Physical                            was performed, and patient medications and                            allergies were reviewed. The patient's tolerance of                            previous anesthesia was also reviewed. The risks                            and benefits of the procedure and the sedation                            options and risks were discussed with the patient.                            All questions were answered, and informed consent                            was obtained. Prior Anticoagulants: The patient has                            taken no previous anticoagulant or antiplatelet                            agents. ASA Grade Assessment: III - A patient with                            severe systemic disease. After reviewing the risks                            and benefits, the patient was deemed in                            satisfactory condition to undergo the procedure.  After obtaining informed consent, the colonoscope                            was passed under direct vision. Throughout the                            procedure, the patient's blood pressure, pulse, and                            oxygen saturations were monitored continuously. The                            Colonoscope was introduced through the anus and                            advanced to the the  terminal ileum, with                            identification of the appendiceal orifice and IC                            valve. The colonoscopy was performed without                            difficulty. The patient tolerated the procedure                            well. The quality of the bowel preparation was                            good. The terminal ileum, ileocecal valve,                            appendiceal orifice, and rectum were photographed. Scope In: 8:12:29 AM Scope Out: 8:25:15 AM Scope Withdrawal Time: 0 hours 11 minutes 4 seconds  Total Procedure Duration: 0 hours 12 minutes 46 seconds  Findings:                 The perianal and digital rectal examinations were                            normal.                           A 10 mm polyp was found in the transverse colon.                            The polyp was sessile. The polyp was removed with a                            piecemeal technique using a hot snare. The polyp                            was removed with a cold snare. Resection and  retrieval were complete. Verification of patient                            identification for the specimen was done. Estimated                            blood loss was minimal.                           Multiple diverticula were found in the sigmoid                            colon.                           The terminal ileum appeared normal.                           The exam was otherwise without abnormality on                            direct and retroflexion views.                           Biopsies for histology were taken with a cold                            forceps from the ascending colon, transverse colon,                            descending colon and sigmoid colon for evaluation                            of microscopic colitis. Complications:            No immediate complications. Estimated Blood Loss:     Estimated blood loss  was minimal. Estimated blood                            loss was minimal. Impression:               - One 10 mm polyp in the transverse colon, removed                            with a cold snare and removed piecemeal using a hot                            snare. Resected and retrieved.                           - Diverticulosis in the sigmoid colon.                           - The examined portion of the ileum was normal.                           -  The examination was otherwise normal on direct                            and retroflexion views. Recommendation:           - Patient has a contact number available for                            emergencies. The signs and symptoms of potential                            delayed complications were discussed with the                            patient. Return to normal activities tomorrow.                            Written discharge instructions were provided to the                            patient.                           - Continue present medications.                           - Clear liquids x 1 hour then soft foods rest of                            day. Start prior diet tomorrow.                           - Await pathology results.                           - Repeat colonoscopy is recommended. The                            colonoscopy date will be determined after pathology                            results from today's exam become available for                            review. Gatha Mayer, MD 09/19/2019 8:46:01 AM This report has been signed electronically.

## 2019-09-19 NOTE — Patient Instructions (Addendum)
Findings summary:  Inflamed stomach and upper intestine - biopsied  Normal esophagus - dilated  Colon polyp - removed  Diverticulosis in colon - common issue - not usually a problem.  Colon lining ok - biopsied to check for microscopic inflammation.  I will let you know pathology results and plans in next 1-2 weeks.  Take Nexium daily.  I appreciate the opportunity to care for you.  Nothing by mouth until 9:30 , clear liquids until 10:30, soft foods for the rest of today. Tomorrow you may resume regular diet. Handout on polyps, diverticulosis given.    YOU HAD AN ENDOSCOPIC PROCEDURE TODAY AT [facility]:   Refer to the procedure report that was given to you for any specific questions about what was found during the examination.  If the procedure report does not answer your questions, please call your gastroenterologist to clarify.  If you requested that your care partner not be given the details of your procedure findings, then the procedure report has been included in a sealed envelope for you to review at your convenience later.  YOU SHOULD EXPECT: Some feelings of bloating in the abdomen. Passage of more gas than usual.  Walking can help get rid of the air that was put into your GI tract during the procedure and reduce the bloating. If you had a lower endoscopy (such as a colonoscopy or flexible sigmoidoscopy) you may notice spotting of blood in your stool or on the toilet paper. If you underwent a bowel prep for your procedure, you may not have a normal bowel movement for a few days.  Please Note:  You might notice some irritation and congestion in your nose or some drainage.  This is from the oxygen used during your procedure.  There is no need for concern and it should clear up in a day or so.  SYMPTOMS TO REPORT IMMEDIATELY:   Following lower endoscopy (colonoscopy or flexible sigmoidoscopy):  Excessive amounts of blood in the stool  Significant tenderness or  worsening of abdominal pains  Swelling of the abdomen that is new, acute  Fever of 100F or higher   Following upper endoscopy (EGD)  Vomiting of blood or coffee ground material  New chest pain or pain under the shoulder blades  Painful or persistently difficult swallowing  New shortness of breath  Fever of 100F or higher  Black, tarry-looking stools  For urgent or emergent issues, a gastroenterologist can be reached at any hour by calling [phone].   DIET:  Nothing by mouth until 9:30, clear liquids until 10:30, soft foods for the rest of today. Tomorrow you may proceed to your regular diet.  Drink plenty of fluids but you should avoid alcoholic beverages for 24 hours.  ACTIVITY:  You should plan to take it easy for the rest of today and you should NOT DRIVE or use heavy machinery until tomorrow (because of the sedation medicines used during the test).    FOLLOW UP: Our staff will call the number listed on your records 48-72 hours following your procedure to check on you and address any questions or concerns that you may have regarding the information given to you following your procedure. If we do not reach you, we will leave a message.  We will attempt to reach you two times.  During this call, we will ask if you have developed any symptoms of COVID 19. If you develop any symptoms (ie: fever, flu-like symptoms, shortness of breath, cough etc.) before  then, please call [phone].  If you test positive for Covid 19 in the 2 weeks post procedure, please call and report this information to [place].    If any biopsies were taken you will be contacted by phone or by letter within the next 1-3 weeks.  Please call us at [phone] if you have not heard about the biopsies in 3 weeks.    SIGNATURES/CONFIDENTIALITY: You and/or your care partner have signed paperwork which will be entered into your electronic medical record.  These signatures attest to the fact that that the information  above on your After Visit Summary has been reviewed and is understood.  Full responsibility of the confidentiality of this discharge information lies with you and/or your care-partner.

## 2019-09-20 ENCOUNTER — Other Ambulatory Visit: Payer: Self-pay

## 2019-09-20 DIAGNOSIS — M159 Polyosteoarthritis, unspecified: Secondary | ICD-10-CM

## 2019-09-20 DIAGNOSIS — M5030 Other cervical disc degeneration, unspecified cervical region: Secondary | ICD-10-CM

## 2019-09-20 MED ORDER — IBUPROFEN 800 MG PO TABS: 800.0000 mg | ORAL_TABLET | Freq: Three times a day (TID) | ORAL | 1 refills | Status: DC | PRN

## 2019-09-23 ENCOUNTER — Telehealth: Payer: Self-pay

## 2019-09-23 NOTE — Telephone Encounter (Signed)
  Follow up Call-  Call back number 09/19/2019  Post procedure Call Back phone  # [phone]  Permission to leave phone message Yes  Some recent data might be hidden     Patient questions:  Do you have a fever, pain , or abdominal swelling? No. Pain Score  0 *  Have you tolerated food without any problems? Yes.    Have you been able to return to your normal activities? Yes.    Do you have any questions about your discharge instructions: Diet   No. Medications  No. Follow up visit  No.  Do you have questions or concerns about your Care? No.  Actions: * If pain score is 4 or above: No action needed, pain <4.  1. Have you developed a fever since your procedure? no  2.   Have you had an respiratory symptoms (SOB or cough) since your procedure? no  3.   Have you tested positive for COVID 19 since your procedure no  4.   Have you had any family members/close contacts diagnosed with the COVID 19 since your procedure?  no   If yes to any of these questions please route to Joylene John, RN and Alphonsa Gin, Therapist, sports.

## 2019-09-25 ENCOUNTER — Other Ambulatory Visit: Payer: Self-pay

## 2019-09-25 ENCOUNTER — Telehealth: Payer: Self-pay | Admitting: Internal Medicine

## 2019-09-25 ENCOUNTER — Other Ambulatory Visit: Payer: Self-pay | Admitting: *Deleted

## 2019-09-25 DIAGNOSIS — A048 Other specified bacterial intestinal infections: Secondary | ICD-10-CM

## 2019-09-25 MED ORDER — DOXYCYCLINE HYCLATE 100 MG PO TBEC: 100.0000 mg | DELAYED_RELEASE_TABLET | Freq: Two times a day (BID) | ORAL | 0 refills | Status: DC

## 2019-09-25 MED ORDER — METRONIDAZOLE 250 MG PO TABS: 250.0000 mg | ORAL_TABLET | Freq: Four times a day (QID) | ORAL | 0 refills | Status: DC

## 2019-09-25 MED ORDER — BISMUTH SUBSALICYLATE 262 MG PO CHEW: 524.0000 mg | CHEWABLE_TABLET | Freq: Three times a day (TID) | ORAL | 0 refills | Status: DC

## 2019-09-25 MED ORDER — OMEPRAZOLE 40 MG PO CPDR: 40.0000 mg | DELAYED_RELEASE_CAPSULE | Freq: Two times a day (BID) | ORAL | 0 refills | Status: DC

## 2019-09-25 NOTE — Telephone Encounter (Signed)
    Patient calling for lab results 

## 2019-09-25 NOTE — Telephone Encounter (Signed)
Spoke to the patient and told the patient she could take her h pylori medications and have her COVID vaccine on Saturday.

## 2019-09-25 NOTE — Telephone Encounter (Signed)
See result note.  

## 2019-09-25 NOTE — Telephone Encounter (Signed)
Patient called states she was instructed not to take any medications however she is scheduled for Covid vaccine and would like to know if that would be ok for her to do.

## 2019-09-28 ENCOUNTER — Ambulatory Visit: Payer: Medicare Other | Attending: Internal Medicine

## 2019-09-28 DIAGNOSIS — Z23 Encounter for immunization: Secondary | ICD-10-CM | POA: Insufficient documentation

## 2019-09-28 NOTE — Progress Notes (Signed)
   Covid-19 Vaccination Clinic  Name:  Felicia Odom    MRN: [id number] DOB: 15-Jun-1956  09/28/2019  [name] was observed post Covid-19 immunization for 15 minutes without incident. She was provided with Vaccine Information Sheet and instruction to access the V-Safe system.   [name] was instructed to call 911 with any severe reactions post vaccine: Marland Kitchen Difficulty breathing  . Swelling of face and throat  . A fast heartbeat  . A bad rash all over body  . Dizziness and weakness   Immunizations Administered    Name Date Dose VIS Date Route   Pfizer COVID-19 Vaccine 09/28/2019  3:03 PM 0.3 mL 07/05/2019 Intramuscular   Manufacturer: [place]   Lot: [id number]   [place]: [id number]

## 2019-09-30 DIAGNOSIS — J45909 Unspecified asthma, uncomplicated: Secondary | ICD-10-CM | POA: Diagnosis not present

## 2019-09-30 DIAGNOSIS — J449 Chronic obstructive pulmonary disease, unspecified: Secondary | ICD-10-CM | POA: Diagnosis not present

## 2019-09-30 DIAGNOSIS — J4520 Mild intermittent asthma, uncomplicated: Secondary | ICD-10-CM | POA: Diagnosis not present

## 2019-10-08 ENCOUNTER — Telehealth: Payer: Self-pay | Admitting: Internal Medicine

## 2019-10-08 NOTE — Telephone Encounter (Signed)
Per Database - pt last filled the hydrocodone 10-325mg  was 09/16/2019.   Pt is not due til this coming Friday at the earliest.

## 2019-10-08 NOTE — Telephone Encounter (Signed)
    1.Medication Requested: HYDROcodone-acetaminophen (NORCO) 10-325 MG tablet  2. Pharmacy (Name, Street, City):[id number] [place], [place].  3. On Med List: yes  4. Last Visit with PCP: 08/19/19  5. Next visit date with PCP: 02/18/20   Agent: Please be advised that RX refills may take up to 3 business days. We ask that you follow-up with your pharmacy.

## 2019-10-11 ENCOUNTER — Other Ambulatory Visit: Payer: Self-pay | Admitting: Internal Medicine

## 2019-10-11 ENCOUNTER — Telehealth: Payer: Self-pay | Admitting: Internal Medicine

## 2019-10-11 NOTE — Telephone Encounter (Signed)
Left message for the patient to call back.

## 2019-10-11 NOTE — Telephone Encounter (Signed)
Spoke to the patient and told the patient to come in for her h. Pylori stool test no earlier than 11/07/19. Verbalized understanding.

## 2019-10-14 ENCOUNTER — Other Ambulatory Visit: Payer: Self-pay | Admitting: Internal Medicine

## 2019-10-14 DIAGNOSIS — M159 Polyosteoarthritis, unspecified: Secondary | ICD-10-CM

## 2019-10-14 DIAGNOSIS — M5030 Other cervical disc degeneration, unspecified cervical region: Secondary | ICD-10-CM

## 2019-10-14 MED ORDER — HYDROCODONE-ACETAMINOPHEN 10-325 MG PO TABS: 1.0000 | ORAL_TABLET | ORAL | 0 refills | Status: DC | PRN

## 2019-10-19 ENCOUNTER — Ambulatory Visit: Payer: Medicare Other | Attending: Internal Medicine

## 2019-10-19 DIAGNOSIS — Z23 Encounter for immunization: Secondary | ICD-10-CM

## 2019-10-19 NOTE — Progress Notes (Signed)
   Covid-19 Vaccination Clinic  Name:  Felicia Odom    MRN: [id number] DOB: 31-Mar-1956  10/19/2019  [name] was observed post Covid-19 immunization for 15 minutes without incident. She was provided with Vaccine Information Sheet and instruction to access the V-Safe system.   [name] was instructed to call 911 with any severe reactions post vaccine: Marland Kitchen Difficulty breathing  . Swelling of face and throat  . A fast heartbeat  . A bad rash all over body  . Dizziness and weakness   Immunizations Administered    Name Date Dose VIS Date Route   Pfizer COVID-19 Vaccine 10/19/2019  1:28 PM 0.3 mL 07/05/2019 Intramuscular   Manufacturer: [place]   Lot: [id number]   [place]: [id number]

## 2019-10-29 ENCOUNTER — Ambulatory Visit: Payer: Medicare Other

## 2019-11-05 DIAGNOSIS — J449 Chronic obstructive pulmonary disease, unspecified: Secondary | ICD-10-CM | POA: Diagnosis not present

## 2019-11-05 DIAGNOSIS — J45909 Unspecified asthma, uncomplicated: Secondary | ICD-10-CM | POA: Diagnosis not present

## 2019-11-05 DIAGNOSIS — J4520 Mild intermittent asthma, uncomplicated: Secondary | ICD-10-CM | POA: Diagnosis not present

## 2019-11-07 ENCOUNTER — Other Ambulatory Visit: Payer: Medicare Other

## 2019-11-08 ENCOUNTER — Other Ambulatory Visit: Payer: Medicare Other

## 2019-11-08 DIAGNOSIS — A048 Other specified bacterial intestinal infections: Secondary | ICD-10-CM | POA: Diagnosis not present

## 2019-11-11 ENCOUNTER — Telehealth: Payer: Self-pay | Admitting: Internal Medicine

## 2019-11-11 LAB — HELICOBACTER PYLORI  SPECIAL ANTIGEN
MICRO NUMBER:: 10372938
SPECIMEN QUALITY: ADEQUATE

## 2019-11-11 NOTE — Telephone Encounter (Signed)
° ° °  1.Medication Requested:HYDROcodone-acetaminophen (NORCO) 10-325 MG tablet  2. Pharmacy (Name, Street, [place]): [id number] [place], [address]  3. On Med List: yes  4. Last Visit with PCP: 08/19/19  5. Next visit date with PCP: 02/18/20   Agent: Please be advised that RX refills may take up to 3 business days. We ask that you follow-up with your pharmacy.

## 2019-11-12 ENCOUNTER — Other Ambulatory Visit: Payer: Self-pay | Admitting: Internal Medicine

## 2019-11-12 ENCOUNTER — Ambulatory Visit: Payer: Medicare Other

## 2019-11-12 ENCOUNTER — Encounter: Payer: Self-pay | Admitting: Internal Medicine

## 2019-11-12 ENCOUNTER — Ambulatory Visit: Payer: Medicare Other | Admitting: Cardiology

## 2019-11-12 DIAGNOSIS — M150 Primary generalized (osteo)arthritis: Secondary | ICD-10-CM

## 2019-11-12 DIAGNOSIS — M159 Polyosteoarthritis, unspecified: Secondary | ICD-10-CM

## 2019-11-12 DIAGNOSIS — M5030 Other cervical disc degeneration, unspecified cervical region: Secondary | ICD-10-CM

## 2019-11-12 MED ORDER — HYDROCODONE-ACETAMINOPHEN 10-325 MG PO TABS: 1.0000 | ORAL_TABLET | ORAL | 0 refills | Status: DC | PRN

## 2019-11-21 ENCOUNTER — Other Ambulatory Visit: Payer: Self-pay | Admitting: Internal Medicine

## 2019-11-21 DIAGNOSIS — K219 Gastro-esophageal reflux disease without esophagitis: Secondary | ICD-10-CM

## 2019-12-04 DIAGNOSIS — J449 Chronic obstructive pulmonary disease, unspecified: Secondary | ICD-10-CM | POA: Diagnosis not present

## 2019-12-04 DIAGNOSIS — J45909 Unspecified asthma, uncomplicated: Secondary | ICD-10-CM | POA: Diagnosis not present

## 2019-12-04 DIAGNOSIS — J4520 Mild intermittent asthma, uncomplicated: Secondary | ICD-10-CM | POA: Diagnosis not present

## 2019-12-09 ENCOUNTER — Telehealth: Payer: Self-pay | Admitting: Internal Medicine

## 2019-12-09 NOTE — Telephone Encounter (Signed)
Per database hydrocodone was last filled on 11/12/2019. Last office visit with PCP was 08/19/2019. Nex visit is scheduled for 02/18/2020

## 2019-12-09 NOTE — Telephone Encounter (Signed)
Patient is requesting a refill on the following medication.  °HYDROcodone-acetaminophen (NORCO) 10-325 MG tablet  °[id number] [place], [place] - [address] Phone:  [phone]  °Fax:  [phone]  °  ° ° °

## 2019-12-12 ENCOUNTER — Other Ambulatory Visit: Payer: Self-pay | Admitting: Internal Medicine

## 2019-12-12 DIAGNOSIS — M150 Primary generalized (osteo)arthritis: Secondary | ICD-10-CM

## 2019-12-12 DIAGNOSIS — M159 Polyosteoarthritis, unspecified: Secondary | ICD-10-CM

## 2019-12-12 DIAGNOSIS — M5030 Other cervical disc degeneration, unspecified cervical region: Secondary | ICD-10-CM

## 2019-12-12 MED ORDER — HYDROCODONE-ACETAMINOPHEN 10-325 MG PO TABS: 1.0000 | ORAL_TABLET | ORAL | 0 refills | Status: DC | PRN

## 2019-12-27 DIAGNOSIS — J449 Chronic obstructive pulmonary disease, unspecified: Secondary | ICD-10-CM | POA: Diagnosis not present

## 2019-12-27 DIAGNOSIS — J4520 Mild intermittent asthma, uncomplicated: Secondary | ICD-10-CM | POA: Diagnosis not present

## 2019-12-27 DIAGNOSIS — J45909 Unspecified asthma, uncomplicated: Secondary | ICD-10-CM | POA: Diagnosis not present

## 2019-12-30 ENCOUNTER — Other Ambulatory Visit: Payer: Self-pay | Admitting: Internal Medicine

## 2019-12-30 DIAGNOSIS — M159 Polyosteoarthritis, unspecified: Secondary | ICD-10-CM

## 2019-12-30 DIAGNOSIS — M5030 Other cervical disc degeneration, unspecified cervical region: Secondary | ICD-10-CM

## 2020-01-06 ENCOUNTER — Telehealth: Payer: Self-pay

## 2020-01-06 NOTE — Telephone Encounter (Signed)
1.Medication Requested:HYDROcodone-acetaminophen (NORCO) 10-325 MG tablet  2. Pharmacy (Name, Street, City):[id number] [place], [address]  3. On Med List: Yes   4. Last Visit with PCP: 2.23.21   5. Next visit date with PCP: 7.27.21    Agent: Please be advised that RX refills may take up to 3 business days. We ask that you follow-up with your pharmacy.

## 2020-01-09 ENCOUNTER — Other Ambulatory Visit: Payer: Self-pay | Admitting: Internal Medicine

## 2020-01-10 ENCOUNTER — Encounter: Payer: Self-pay | Admitting: Internal Medicine

## 2020-01-10 NOTE — Telephone Encounter (Signed)
1.Medication Requested:HYDROcodone-acetaminophen (NORCO) 10-325 MG tablet  2. Pharmacy (Name, Street, City):[id number] [place], [place].  3. On Med List: yes  4. Last Visit with PCP: 07/2019  5. Next visit date with PCP:01/2020   Agent: Please be advised that RX refills may take up to 3 business days. We ask that you follow-up with your pharmacy.

## 2020-01-10 NOTE — Telephone Encounter (Signed)
Duplicate message   Original message was sent to Dr. Ronnald Ramp on 01/06/2020

## 2020-01-12 ENCOUNTER — Other Ambulatory Visit: Payer: Self-pay | Admitting: Internal Medicine

## 2020-01-12 DIAGNOSIS — M5030 Other cervical disc degeneration, unspecified cervical region: Secondary | ICD-10-CM

## 2020-01-12 DIAGNOSIS — M159 Polyosteoarthritis, unspecified: Secondary | ICD-10-CM

## 2020-01-12 MED ORDER — HYDROCODONE-ACETAMINOPHEN 10-325 MG PO TABS: 1.0000 | ORAL_TABLET | ORAL | 0 refills | Status: DC | PRN

## 2020-01-16 ENCOUNTER — Ambulatory Visit: Payer: Medicare Other | Admitting: Internal Medicine

## 2020-01-21 ENCOUNTER — Telehealth: Payer: Self-pay | Admitting: Internal Medicine

## 2020-01-21 ENCOUNTER — Other Ambulatory Visit: Payer: Self-pay | Admitting: Internal Medicine

## 2020-01-21 MED ORDER — ALPRAZOLAM 1 MG PO TABS: 1.0000 mg | ORAL_TABLET | Freq: Four times a day (QID) | ORAL | 0 refills | Status: DC | PRN

## 2020-01-21 NOTE — Telephone Encounter (Signed)
Called and spoke with pt letting her know that [facility] sent in #15 tabs of xanax and stated that we would have CY further address refills of med. She verbalized understanding. Nothing further needed.

## 2020-01-21 NOTE — Telephone Encounter (Signed)
Patient requesting refill on Xanax 1 mg, last filled 07/18/2019. Please refill/advise.

## 2020-01-21 NOTE — Telephone Encounter (Signed)
Pt is requesting refill on xanax lov 07/18/19

## 2020-01-21 NOTE — Telephone Encounter (Signed)
You can send in 15 tablets, refill will need to be filled by Dr. Annamaria Boots after that

## 2020-01-21 NOTE — Telephone Encounter (Signed)
You can pend it to me but I will take care of it. Thanks  Sent in 15 tablets, further refills to be done by Dr. Annamaria Boots

## 2020-01-21 NOTE — Addendum Note (Signed)
Addended by: Martyn Ehrich on: 01/21/2020 02:12 PM   Modules accepted: Orders

## 2020-01-21 NOTE — Telephone Encounter (Signed)
Pt  xanax medication is a control can't send in

## 2020-01-22 ENCOUNTER — Telehealth: Payer: Self-pay | Admitting: Internal Medicine

## 2020-01-22 NOTE — Telephone Encounter (Signed)
Patient states Xanax needs PO number for prescribing Doctor. [facility]acy is [facility] [address] Patient phone number is [phone].

## 2020-01-22 NOTE — Telephone Encounter (Signed)
[facility]  There was not anything needed needed and her Rx is ready to be picked up  I called and spoke with the pt and notified her of this  Nothing further needed

## 2020-01-22 NOTE — Telephone Encounter (Signed)
Dr. Annamaria Boots is back Tuesday 7/6. Please send to him when he returns. I am not comfortable refilling this.

## 2020-01-22 NOTE — Telephone Encounter (Signed)
This needs to be addressed on 01/23/2020. We do not want patient to suddenly stop her benzodiazepine. She only has a four day supply because she takes it every six hours. Save in [place].

## 2020-01-22 NOTE — Telephone Encounter (Signed)
This patient got a refill of Xanax yesterday (15 pills) from NP [facility]. She is upset, saying that she takes it every six hours and will not have enough to stay on it until her appointment with Dr. Annamaria Boots on 02/10/2020. Concerned about benzo withdrawal. Please advise. She has enough for four days.

## 2020-01-23 ENCOUNTER — Telehealth: Payer: Self-pay | Admitting: Internal Medicine

## 2020-01-23 DIAGNOSIS — J449 Chronic obstructive pulmonary disease, unspecified: Secondary | ICD-10-CM | POA: Diagnosis not present

## 2020-01-23 DIAGNOSIS — J45909 Unspecified asthma, uncomplicated: Secondary | ICD-10-CM | POA: Diagnosis not present

## 2020-01-23 DIAGNOSIS — J4520 Mild intermittent asthma, uncomplicated: Secondary | ICD-10-CM | POA: Diagnosis not present

## 2020-01-23 NOTE — Telephone Encounter (Signed)
Dr. Elsworth Soho, can you advise on this situation?

## 2020-01-23 NOTE — Telephone Encounter (Signed)
Last seen by Dr. Annamaria Boots December 2020. Xanax refilled for anxiety. Further refills will have to come from PCP where she is also getting prescription for hydrocodone

## 2020-01-23 NOTE — Telephone Encounter (Signed)
Spoke with the pt and notified of response per Dr Elsworth Soho  She would like for Dr Annamaria Boots to address this since he is her physician here and she would like to be worked into see him  She understands that he is out of the office until 01/28/20

## 2020-01-23 NOTE — Telephone Encounter (Signed)
Patient called and notified that we are working on this.

## 2020-01-24 ENCOUNTER — Telehealth: Payer: Self-pay | Admitting: Internal Medicine

## 2020-01-24 NOTE — Telephone Encounter (Signed)
Patient is aware that we are awaiting Dr. Annamaria Boots response for medication to be refilled. She states her refill just expired and this is why she was unable to refill it.  She would like [place] not to contact her pcp about her care anymore.   Will await for Dr. Annamaria Boots responses.

## 2020-01-24 NOTE — Telephone Encounter (Signed)
Closing messages due to multiple message open.

## 2020-01-24 NOTE — Telephone Encounter (Signed)
Closing message due to multiple messages being open.

## 2020-01-27 ENCOUNTER — Other Ambulatory Visit: Payer: Self-pay | Admitting: Internal Medicine

## 2020-01-27 DIAGNOSIS — J4520 Mild intermittent asthma, uncomplicated: Secondary | ICD-10-CM

## 2020-01-28 ENCOUNTER — Ambulatory Visit (INDEPENDENT_AMBULATORY_CARE_PROVIDER_SITE_OTHER): Payer: Medicare Other | Admitting: Internal Medicine

## 2020-01-28 ENCOUNTER — Other Ambulatory Visit: Payer: Self-pay | Admitting: Internal Medicine

## 2020-01-28 ENCOUNTER — Other Ambulatory Visit: Payer: Self-pay

## 2020-01-28 ENCOUNTER — Encounter: Payer: Self-pay | Admitting: Internal Medicine

## 2020-01-28 ENCOUNTER — Ambulatory Visit: Payer: Medicare Other | Admitting: Internal Medicine

## 2020-01-28 VITALS — BP 128/72 | HR 72 | Temp 98.5°F | Resp 16 | Ht 65.5 in | Wt 206.5 lb

## 2020-01-28 DIAGNOSIS — I700 Atherosclerosis of aorta: Secondary | ICD-10-CM | POA: Diagnosis not present

## 2020-01-28 DIAGNOSIS — F331 Major depressive disorder, recurrent, moderate: Secondary | ICD-10-CM

## 2020-01-28 DIAGNOSIS — E118 Type 2 diabetes mellitus with unspecified complications: Secondary | ICD-10-CM | POA: Diagnosis not present

## 2020-01-28 DIAGNOSIS — E785 Hyperlipidemia, unspecified: Secondary | ICD-10-CM

## 2020-01-28 DIAGNOSIS — I10 Essential (primary) hypertension: Secondary | ICD-10-CM | POA: Diagnosis not present

## 2020-01-28 DIAGNOSIS — Z0000 Encounter for general adult medical examination without abnormal findings: Secondary | ICD-10-CM | POA: Diagnosis not present

## 2020-01-28 HISTORY — DX: Encounter for general adult medical examination without abnormal findings: Z00.00

## 2020-01-28 HISTORY — DX: Major depressive disorder, recurrent, moderate: F33.1

## 2020-01-28 LAB — BASIC METABOLIC PANEL
BUN: 11 mg/dL (ref 6–23)
CO2: 30 mEq/L (ref 19–32)
Calcium: 9.1 mg/dL (ref 8.4–10.5)
Chloride: 102 mEq/L (ref 96–112)
Creatinine, Ser: 0.7 mg/dL (ref 0.40–1.20)
GFR: 84.44 mL/min (ref >60.00)
Glucose, Bld: 117 mg/dL — ABNORMAL HIGH (ref 70–99)
Potassium: 4 mEq/L (ref 3.5–5.1)
Sodium: 140 mEq/L (ref 135–145)

## 2020-01-28 LAB — CBC WITH DIFFERENTIAL/PLATELET
Basophils Absolute: 0.1 10*3/uL (ref 0.0–0.1)
Basophils Relative: 1.3 % (ref 0.0–3.0)
Eosinophils Absolute: 0.2 10*3/uL (ref 0.0–0.7)
Eosinophils Relative: 3.3 % (ref 0.0–5.0)
HCT: 43.1 % (ref 36.0–46.0)
Hemoglobin: 14.8 g/dL (ref 12.0–15.0)
Lymphocytes Relative: 28.2 % (ref 12.0–46.0)
Lymphs Abs: 1.5 10*3/uL (ref 0.7–4.0)
MCHC: 34.3 g/dL (ref 30.0–36.0)
MCV: 85.6 fl (ref 78.0–100.0)
Monocytes Absolute: 0.5 10*3/uL (ref 0.1–1.0)
Monocytes Relative: 9.2 % (ref 3.0–12.0)
Neutro Abs: 3.1 10*3/uL (ref 1.4–7.7)
Neutrophils Relative %: 58 % (ref 43.0–77.0)
Platelets: 235 10*3/uL (ref 150.0–400.0)
RBC: 5.04 Mil/uL (ref 3.87–5.11)
RDW: 13.9 % (ref 11.5–15.5)
WBC: 5.3 10*3/uL (ref 4.0–10.5)

## 2020-01-28 LAB — LIPID PANEL
Cholesterol: 186 mg/dL (ref 0–200)
HDL: 42.6 mg/dL (ref >39.00)
LDL Cholesterol: 104 mg/dL — ABNORMAL HIGH (ref 0–99)
NonHDL: 143.52
Total CHOL/HDL Ratio: 4
Triglycerides: 196 mg/dL — ABNORMAL HIGH (ref 0.0–149.0)
VLDL: 39.2 mg/dL (ref 0.0–40.0)

## 2020-01-28 LAB — HEMOGLOBIN A1C: Hgb A1c MFr Bld: 6.2 % (ref 4.6–6.5)

## 2020-01-28 MED ORDER — ALPRAZOLAM 1 MG PO TABS: 1.0000 mg | ORAL_TABLET | Freq: Four times a day (QID) | ORAL | 3 refills | Status: DC | PRN

## 2020-01-28 MED ORDER — ESCITALOPRAM OXALATE 5 MG PO TABS: 5.0000 mg | ORAL_TABLET | Freq: Every day | ORAL | 0 refills | Status: DC

## 2020-01-28 NOTE — Progress Notes (Signed)
Subjective:  Patient ID: Felicia Odom, female    DOB: November 05, 1955  Age: 64 y.o. MRN: [id number]  CC: Annual Exam and Depression  This visit occurred during the SARS-CoV-2 public health emergency.  Safety protocols were in place, including screening questions prior to the visit, additional usage of staff PPE, and extensive cleaning of exam room while observing appropriate contact time as indicated for disinfecting solutions.    HPI Felicia Odom presents for a CPX.  She continues to complain of chronic musculoskeletal pain and wants to stay on the current dose of hydrocodone/acetaminophen.  She complains of chronic, unchanged hot flashes and night sweats.  She also complains of signs and symptoms of depression.  She is not currently taking an antidepressant.  She is receiving a benzodiazepine prescription from another physician.  She is active and denies any recent episodes of chest pain, shortness of breath, palpitations, or edema.  Outpatient Medications Prior to Visit  Medication Sig Dispense Refill  . albuterol (PROAIR HFA) 108 (90 Base) MCG/ACT inhaler INHALE 1 TO 2 PUFFS INTO THE LUNGS EVERY 4 HOURS AS NEEDED FOR WHEEZING OR SHORTNESS OF BREATH 18 g 12  . albuterol (PROVENTIL) (2.5 MG/3ML) 0.083% nebulizer solution Use 1 vial every 6 hours and as needed.DX:J45.909 360 mL 11  . aspirin EC 81 MG tablet Take 81 mg by mouth daily.    . Cyanocobalamin (VITAMIN B 12 PO) Take by mouth.    . esomeprazole (NEXIUM) 40 MG capsule TAKE 1 CAPSULE BY MOUTH EVERY DAY 90 capsule 1  . folic acid (FOLVITE) 1 MG tablet Take 1 mg by mouth daily.    Marland Kitchen HYDROcodone-acetaminophen (NORCO) 10-325 MG tablet Take 1 tablet by mouth every 4 (four) hours as needed (for pain). 90 tablet 0  . ibuprofen (ADVIL) 800 MG tablet TAKE 1 TABLET BY MOUTH EVERY 8 HOURS AS NEEDED 180 tablet 1  . ALPRAZolam (XANAX) 1 MG tablet Take 1 tablet (1 mg total) by mouth every 6 (six) hours as needed. Dr. Annamaria Boots to fill  additional refills 15 tablet 0  . Coenzyme Q10 (CO Q 10 PO) Take by mouth.    . Red Yeast Rice Extract (RED YEAST RICE PO) Take by mouth.    . rosuvastatin (CRESTOR) 40 MG tablet Take 1 tablet (40 mg total) by mouth daily. (Patient not taking: Reported on 09/19/2019) 90 tablet 3   No facility-administered medications prior to visit.    ROS Review of Systems  Constitutional: Positive for unexpected weight change (wt gain). Negative for appetite change, chills, diaphoresis and fatigue.  HENT: Negative.  Negative for sore throat and trouble swallowing.   Eyes: Negative.   Respiratory: Negative for cough, chest tightness, shortness of breath and wheezing.   Cardiovascular: Negative for chest pain, palpitations and leg swelling.  Gastrointestinal: Negative for abdominal pain, constipation, diarrhea, nausea and vomiting.  Endocrine: Negative.   Genitourinary: Negative for decreased urine volume, difficulty urinating, dysuria, frequency and urgency.  Musculoskeletal: Positive for arthralgias and back pain. Negative for neck pain.  Skin: Negative.  Negative for color change, pallor and rash.  Neurological: Negative.  Negative for dizziness, weakness, light-headedness, numbness and headaches.  Hematological: Negative for adenopathy. Does not bruise/bleed easily.  Psychiatric/Behavioral: Positive for dysphoric mood and sleep disturbance. Negative for decreased concentration, self-injury and suicidal ideas. The patient is nervous/anxious. The patient is not hyperactive.     Objective:  BP 128/72 (BP Location: Left Arm, Patient Position: Sitting, Cuff Size: Large)   Pulse 72  Temp 98.5 F (36.9 C) (Oral)   Resp 16   Ht 5' 5.5" (1.664 m)   Wt 206 lb 8 oz (93.7 kg)   SpO2 98%   BMI 33.84 kg/m   BP Readings from Last 3 Encounters:  01/28/20 128/72  09/19/19 (!) 125/56  08/19/19 126/70    Wt Readings from Last 3 Encounters:  01/28/20 206 lb 8 oz (93.7 kg)  09/19/19 203 lb (92.1 kg)    08/19/19 203 lb (92.1 kg)    Physical Exam Vitals reviewed.  Constitutional:      Appearance: Normal appearance. She is obese.  HENT:     Nose: Nose normal.     Mouth/Throat:     Mouth: Mucous membranes are moist.  Eyes:     General: No scleral icterus.    Conjunctiva/sclera: Conjunctivae normal.  Cardiovascular:     Rate and Rhythm: Normal rate and regular rhythm.     Heart sounds: No murmur heard.   Pulmonary:     Effort: Pulmonary effort is normal.     Breath sounds: No stridor. No wheezing, rhonchi or rales.  Abdominal:     General: Abdomen is protuberant. Bowel sounds are normal. There is no distension.     Palpations: Abdomen is soft. There is no hepatomegaly, splenomegaly or mass.     Tenderness: There is no abdominal tenderness.     Hernia: No hernia is present.  Musculoskeletal:        General: Normal range of motion.     Cervical back: Neck supple.     Right lower leg: No edema.     Left lower leg: No edema.  Lymphadenopathy:     Cervical: No cervical adenopathy.  Skin:    General: Skin is warm and dry.     Coloration: Skin is not pale.  Neurological:     General: No focal deficit present.     Mental Status: She is alert.  Psychiatric:        Attention and Perception: She is inattentive.        Mood and Affect: Mood is anxious and depressed. Affect is tearful.        Speech: Speech is tangential. Speech is not rapid and pressured, delayed or slurred.        Behavior: Behavior is not agitated, slowed, aggressive or withdrawn. Behavior is cooperative.        Thought Content: Thought content normal. Thought content is not paranoid or delusional. Thought content does not include homicidal or suicidal ideation.        Cognition and Memory: Cognition normal.        Judgment: Judgment normal.     Lab Results  Component Value Date   WBC 5.3 01/28/2020   HGB 14.8 01/28/2020   HCT 43.1 01/28/2020   PLT 235.0 01/28/2020   GLUCOSE 117 (H) 01/28/2020   CHOL 186  01/28/2020   TRIG 196.0 (H) 01/28/2020   HDL 42.60 01/28/2020   LDLDIRECT 142.0 01/21/2015   LDLCALC 104 (H) 01/28/2020   ALT 23 08/08/2019   AST 20 08/08/2019   NA 140 01/28/2020   K 4.0 01/28/2020   CL 102 01/28/2020   CREATININE 0.70 01/28/2020   BUN 11 01/28/2020   CO2 30 01/28/2020   TSH 2.10 08/08/2019   INR 0.89 06/08/2016   HGBA1C 6.2 01/28/2020   MICROALBUR <0.7 08/19/2019    CT CHEST LUNG CA SCREEN LOW DOSE W/O CM  Result Date: 07/15/2019 CLINICAL DATA:  64 year old female  with 33 pack-year history of smoking. Lung cancer screening. EXAM: CT CHEST WITHOUT CONTRAST LOW-DOSE FOR LUNG CANCER SCREENING TECHNIQUE: Multidetector CT imaging of the chest was performed following the standard protocol without IV contrast. COMPARISON:  07/11/2018 FINDINGS: Cardiovascular: The heart size is normal. No substantial pericardial effusion. No discernible coronary artery calcification. Atherosclerotic calcification is noted in the wall of the thoracic aorta. Mediastinum/Nodes: Stable small right thyroid nodule. No mediastinal lymphadenopathy. No evidence for gross hilar lymphadenopathy although assessment is limited by the lack of intravenous contrast on today's study. The esophagus has normal imaging features. There is no axillary lymphadenopathy. Lungs/Pleura: Tiny right apical nodule identified measuring only 1.5 mm. No suspicious pulmonary nodule or mass. No focal airspace consolidation. No pleural effusion. Upper Abdomen: Unremarkable. Musculoskeletal: No worrisome lytic or sclerotic osseous abnormality. IMPRESSION: 1. Lung-RADS 2, benign appearance or behavior. Continue annual screening with low-dose chest CT without contrast in 12 months. 2.  Emphysema. (ICD10-J43.9) 3.  Aortic Atherosclerois (ICD10-170.0) Electronically Signed   By: Misty Stanley M.D.   On: 07/15/2019 11:22    Assessment & Plan:   Jeneane was seen today for annual exam and depression.  Diagnoses and all orders for this  visit:  Type II diabetes mellitus with manifestations ([place])- Her A1c is at 6.2%.  Her blood sugars are adequately well controlled. -     Basic metabolic panel; Future -     Hemoglobin A1c; Future -     Hemoglobin A1c -     Basic metabolic panel  Essential hypertension- Her blood pressure is adequately well controlled. -     CBC with Differential/Platelet; Future -     CBC with Differential/Platelet -     Basic metabolic panel  Atherosclerosis of aorta ([place])- Will continue to address risk factor modifications. -     Lipid panel; Future -     Lipid panel  Hyperlipidemia with target LDL less than 100- I have asked her to take a statin for cardiovascular risk reduction. -     Lipid panel; Future -     Lipid panel  Moderate episode of recurrent major depressive disorder ([place])- Will start escitalopram at 5 mg a day.  I anticipate increasing the dose over time. -     escitalopram (LEXAPRO) 5 MG tablet; Take 1 tablet (5 mg total) by mouth daily.  Routine general medical examination at a health care facility- Exam completed, labs reviewed, vaccines reviewed and updated-she refuses a pneumonia vaccine, screening for cervical cancer and colon cancer is up-to-date, she is referred for screening mammogram, patient education was given.   I have discontinued Chanita Boden. Teagle's rosuvastatin, Red Yeast Rice Extract (RED YEAST RICE PO), and Coenzyme Q10 (CO Q 10 PO). I am also having her start on escitalopram and rosuvastatin. Additionally, I am having her maintain her aspirin EC, folic acid, Cyanocobalamin (VITAMIN B 12 PO), albuterol, albuterol, esomeprazole, ibuprofen, and HYDROcodone-acetaminophen.  Meds ordered this encounter  Medications  . escitalopram (LEXAPRO) 5 MG tablet    Sig: Take 1 tablet (5 mg total) by mouth daily.    Dispense:  90 tablet    Refill:  0  . rosuvastatin (CRESTOR) 5 MG tablet    Sig: Take 1 tablet (5 mg total) by mouth daily.    Dispense:  90 tablet    Refill:   3   In addition to time spent on CPE, I spent 50 minutes in preparing to see the patient by review of recent labs, imaging and procedures, obtaining  and reviewing separately obtained history, communicating with the patient and family or caregiver, ordering medications, tests or procedures, and documenting clinical information in the EHR including the differential Dx, treatment, and any further evaluation and other management of 1. Type II diabetes mellitus with manifestations ([place]) 2. Essential hypertension 3. Atherosclerosis of aorta ([place]) 4. Hyperlipidemia with target LDL less than 100 5. Moderate episode of recurrent major depressive disorder ([place])    Follow-up: Return in about 3 months (around 04/29/2020).  Scarlette Calico, MD

## 2020-01-28 NOTE — Telephone Encounter (Signed)
Spoke with pt, requesting refill on Xanax.   Last refill: 01/21/2020#15 with 0 refills by Derl Barrow.  Take 1 tab PO every 6 hours PRN.   Had previously been filled by [facility] as #120 with 5 refills, take 1 tab q6h prn.  Last OV: 07/18/2019 Next OV: 02/10/2020.   [facility] please advise on refill.  I have pended a refill based on her 30 day supply previously written.

## 2020-01-28 NOTE — Telephone Encounter (Signed)
Alprazolam refilled as requested

## 2020-01-28 NOTE — Telephone Encounter (Signed)
Pt calling back about this. Pt asking for refill of medication.

## 2020-01-28 NOTE — Patient Instructions (Signed)
Major Depressive Disorder, Adult Major depressive disorder (MDD) is a mental health condition. It may also be called clinical depression or unipolar depression. MDD usually causes feelings of sadness, hopelessness, or helplessness. MDD can also cause physical symptoms. It can interfere with work, school, relationships, and other everyday activities. MDD may be mild, moderate, or severe. It may occur once (single episode major depressive disorder) or it may occur multiple times (recurrent major depressive disorder). What are the causes? The exact cause of this condition is not known. MDD is most likely caused by a combination of things, which may include:  Genetic factors. These are traits that are passed along from parent to child.  Individual factors. Your personality, your behavior, and the way you handle your thoughts and feelings may contribute to MDD. This includes personality traits and behaviors learned from others.  Physical factors, such as: ? Differences in the part of your brain that controls emotion. This part of your brain may be different than it is in people who do not have MDD. ? Long-term (chronic) medical or psychiatric illnesses.  Social factors. Traumatic experiences or major life changes may play a role in the development of MDD. What increases the risk? This condition is more likely to develop in women. The following factors may also make you more likely to develop MDD:  A family history of depression.  Troubled family relationships.  Abnormally low levels of certain brain chemicals.  Traumatic events in childhood, especially abuse or the loss of a parent.  Being under a lot of stress, or long-term stress, especially from upsetting life experiences or losses.  A history of: ? Chronic physical illness. ? Other mental health disorders. ? Substance abuse.  Poor living conditions.  Experiencing social exclusion or discrimination on a regular basis. What are the  signs or symptoms? The main symptoms of MDD typically include:  Constant depressed or irritable mood.  Loss of interest in things and activities. MDD symptoms may also include:  Sleeping or eating too much or too little.  Unexplained weight change.  Fatigue or low energy.  Feelings of worthlessness or guilt.  Difficulty thinking clearly or making decisions.  Thoughts of suicide or of harming others.  Physical agitation or weakness.  Isolation. Severe cases of MDD may also occur with other symptoms, such as:  Delusions or hallucinations, in which you imagine things that are not real (psychotic depression).  Low-level depression that lasts at least a year (chronic depression or persistent depressive disorder).  Extreme sadness and hopelessness (melancholic depression).  Trouble speaking and moving (catatonic depression). How is this diagnosed? This condition may be diagnosed based on:  Your symptoms.  Your medical history, including your mental health history. This may involve tests to evaluate your mental health. You may be asked questions about your lifestyle, including any drug and alcohol use, and how long you have had symptoms of MDD.  A physical exam.  Blood tests to rule out other conditions. You must have a depressed mood and at least four other MDD symptoms most of the day, nearly every day in the same 2-week timeframe before your health care provider can confirm a diagnosis of MDD. How is this treated? This condition is usually treated by mental health professionals, such as psychologists, psychiatrists, and clinical social workers. You may need more than one type of treatment. Treatment may include:  Psychotherapy. This is also called talk therapy or counseling. Types of psychotherapy include: ? Cognitive behavioral therapy (CBT). This type of therapy   teaches you to recognize unhealthy feelings, thoughts, and behaviors, and replace them with positive thoughts  and actions. ? Interpersonal therapy (IPT). This helps you to improve the way you relate to and communicate with others. ? Family therapy. This treatment includes members of your family.  Medicine to treat anxiety and depression, or to help you control certain emotions and behaviors.  Lifestyle changes, such as: ? Limiting alcohol and drug use. ? Exercising regularly. ? Getting plenty of sleep. ? Making healthy eating choices. ? Spending more time outdoors.  Treatments involving stimulation of the brain can be used in situations with extremely severe symptoms, or when medicine or other therapies do not work over time. These treatments include electroconvulsive therapy, transcranial magnetic stimulation, and vagal nerve stimulation. Follow these instructions at home: Activity  Return to your normal activities as told by your health care provider.  Exercise regularly and spend time outdoors as told by your health care provider. General instructions  Take over-the-counter and prescription medicines only as told by your health care provider.  Do not drink alcohol. If you drink alcohol, limit your alcohol intake to no more than 1 drink a day for nonpregnant women and 2 drinks a day for men. One drink equals 12 oz of beer, 5 oz of wine, or 1 oz of hard liquor. Alcohol can affect any antidepressant medicines you are taking. Talk to your health care provider about your alcohol use.  Eat a healthy diet and get plenty of sleep.  Find activities that you enjoy doing, and make time to do them.  Consider joining a support group. Your health care provider may be able to recommend a support group.  Keep all follow-up visits as told by your health care provider. This is important. Where to find more information National Alliance on Mental Illness  www.nami.org [place]. National Institute of Mental Health  www.nimh.nih.gov National Suicide Prevention Lifeline  1-800-273-TALK (8255). This is  free, 24-hour help. Contact a health care provider if:  Your symptoms get worse.  You develop new symptoms. Get help right away if:  You self-harm.  You have serious thoughts about hurting yourself or others.  You see, hear, taste, smell, or feel things that are not present (hallucinate). This information is not intended to replace advice given to you by your health care provider. Make sure you discuss any questions you have with your health care provider. Document Revised: 06/23/2017 Document Reviewed: 01/20/2016 Elsevier Patient Education  2020 Elsevier Inc.  

## 2020-01-28 NOTE — Telephone Encounter (Signed)
error 

## 2020-01-29 DIAGNOSIS — L814 Other melanin hyperpigmentation: Secondary | ICD-10-CM | POA: Diagnosis not present

## 2020-01-29 DIAGNOSIS — L821 Other seborrheic keratosis: Secondary | ICD-10-CM | POA: Diagnosis not present

## 2020-01-29 DIAGNOSIS — I788 Other diseases of capillaries: Secondary | ICD-10-CM | POA: Diagnosis not present

## 2020-01-29 DIAGNOSIS — L820 Inflamed seborrheic keratosis: Secondary | ICD-10-CM | POA: Diagnosis not present

## 2020-01-30 ENCOUNTER — Encounter: Payer: Self-pay | Admitting: Internal Medicine

## 2020-01-30 MED ORDER — ROSUVASTATIN CALCIUM 5 MG PO TABS: 5.0000 mg | ORAL_TABLET | Freq: Every day | ORAL | 3 refills | Status: DC

## 2020-02-04 NOTE — Telephone Encounter (Signed)
Rx was refilled by Dr. Annamaria Boots on 01/28/2020 per our records.

## 2020-02-06 ENCOUNTER — Telehealth: Payer: Self-pay | Admitting: Internal Medicine

## 2020-02-06 LAB — HM MAMMOGRAPHY: HM Mammogram: NORMAL (ref 0–4)

## 2020-02-06 NOTE — Telephone Encounter (Signed)
   1.Medication Requested:HYDROcodone-acetaminophen (NORCO) 10-325 MG tablet  2. Pharmacy (Name, Street, City):[id number] - [place], [place].  3. On Med List: yes  4. Last Visit with PCP: 01/2020  5. Next visit date with PCP: 04/29/20   Agent: Please be advised that RX refills may take up to 3 business days. We ask that you follow-up with your pharmacy.

## 2020-02-08 ENCOUNTER — Other Ambulatory Visit: Payer: Self-pay | Admitting: Internal Medicine

## 2020-02-08 DIAGNOSIS — M5030 Other cervical disc degeneration, unspecified cervical region: Secondary | ICD-10-CM

## 2020-02-08 DIAGNOSIS — M150 Primary generalized (osteo)arthritis: Secondary | ICD-10-CM

## 2020-02-08 DIAGNOSIS — M159 Polyosteoarthritis, unspecified: Secondary | ICD-10-CM

## 2020-02-08 MED ORDER — HYDROCODONE-ACETAMINOPHEN 10-325 MG PO TABS: 1.0000 | ORAL_TABLET | ORAL | 0 refills | Status: DC | PRN

## 2020-02-10 ENCOUNTER — Encounter: Payer: Self-pay | Admitting: Internal Medicine

## 2020-02-10 ENCOUNTER — Other Ambulatory Visit: Payer: Self-pay

## 2020-02-10 ENCOUNTER — Ambulatory Visit (INDEPENDENT_AMBULATORY_CARE_PROVIDER_SITE_OTHER): Payer: Medicare Other | Admitting: Internal Medicine

## 2020-02-10 ENCOUNTER — Ambulatory Visit: Payer: Medicare Other | Admitting: Internal Medicine

## 2020-02-10 ENCOUNTER — Telehealth: Payer: Self-pay

## 2020-02-10 DIAGNOSIS — F410 Panic disorder [episodic paroxysmal anxiety] without agoraphobia: Secondary | ICD-10-CM

## 2020-02-10 DIAGNOSIS — J449 Chronic obstructive pulmonary disease, unspecified: Secondary | ICD-10-CM

## 2020-02-10 MED ORDER — BREZTRI AEROSPHERE 160-9-4.8 MCG/ACT IN AERO
2.0000 | INHALATION_SPRAY | Freq: Two times a day (BID) | RESPIRATORY_TRACT | 0 refills | Status: DC
Start: 2020-02-10 — End: 2020-04-29

## 2020-02-10 NOTE — Progress Notes (Signed)
Patient ID: Felicia Odom, female    DOB: 1955/08/03, 64 y.o.   MRN: [id number]  HPI F former smoker followed for COPD, Allergic rhinitis, complicated by Thymoma/ resected, anxiety Office spirometry- 01/29/15 Normal spirometry. FVC 2.74/81%, FEV1 2.11/79%, FEV1/FVC 0.77, FEF 25-75 percent 1.79 Office Spirometry 10/21/2015-slight restriction of exhaled volume, mild obstruction. FVC 2.69/78%, FEV1 2.04/76%, FEV1/FVC 0.76, FEF 25-75 percent 1.68/65% PFT 04/27/2016-minimal obstructive airways disease, minimal diffusion defect, insignificant response to bronchodilator. FVC 2.79/78%, FEV1 2.11/76%, ratio 0.76, FEF 25-75 percent 1.81/72%, TLC 92%, DLCO 76%. .----------------------------------------------------------------------------------------------------  07/18/2019- 64 year old female former smoker followed for asthma/bronchitis, allergic rhinitis, complicated by resection Thymoma/ TSGY, OCD/panic, anxiety, GERD, DM2, Declines flu vax. Working with cardiology for cholesterol/ ASCVD Still c/o sharp somewhat pleuritic intermittent pain localized medial to R breast since thoracotomy for thymectomy, interpreted as mscwp. Not worse. Still deals with anxiety, some stable DOE and cough w little sputum. No blood or fever.  02/10/20- 64 year old female former smoker followed for asthma/bronchitis, allergic rhinitis, complicated by resection Thymoma/ TSGY, OCD/panic, Anxiety/ Depression, GERD, DM2, HTN, Eczema,  -----Shortness of breath with exertion Neb albuterol , Proair hfa,  Had 2 Phizer Covax Dyspneic mainly with hot humid weather . Little cough, no acute issue. Bothersome residual R parasternal pain after thymectomy surgery has finally resolved. Pleaded with me to let her continue alprazolam for anxiety. Discussed, with emphasis on issues of dependence and over-reliance esp as she gets older.  She denies any sense of oversedation. Discussed screening CT- to continue.  CT chest low dose screen  07/15/2019 IMPRESSION: 1. Lung-RADS 2, benign appearance or behavior. Continue annual screening with low-dose chest CT without contrast in 12 months. 2.  Emphysema. (ICD10-J43.9) 3.  Aortic Atherosclerois (ICD10-170.0)  Review of Systems- see HPI + = positive Constitutional:   No-   weight loss, night sweats, fevers, chills, fatigue, lassitude. HEENT:   No-  headaches, difficulty swallowing, tooth/dental problems, sore throat,       No-  sneezing, itching, ear ache, nasal congestion, post nasal drip,  CV:  + chest pain, No-orthopnea, PND, swelling in lower extremities, anasarca, dizziness, palpitations Resp: + shortness of breath with exertion or at rest.              productive cough,   non-productive cough,  No- coughing up of blood.            change in color of mucus.  No- wheezing.   Skin: No-   rash or lesions. GI:    heartburn, indigestion, No-abdominal pain, nausea, vomiting,  GU:  MS:  + joint pain or swelling.  . Neuro-     nothing unusual Psych: change in mood or affect. +Chronic depression , + anxiety.  No memory loss.   Objective:   Physical Exam General- Alert, Oriented , Distress- none acute, + Obese Skin- rash-none, lesions- none, excoriation- none Lymphadenopathy- none Head- + healed scar forehead            Eyes- Gross vision intact, PERRLA, conjunctivae clear secretions            Ears- Hearing, canals-normal            Nose- No- rhinorrhea, no-Septal dev,, polyps, erosion, perforation             Throat- Mallampati II , mucosa clear , drainage- none, tonsils- atrophic. +Missing teeth Neck- flexible , trachea midline, no stridor , thyroid nl, carotid no bruit Chest - symmetrical excursion , unlabored  Heart/CV- RRR , no murmur , no gallop, no rub, nl s1 s2                           - JVD- none , edema- none, stasis changes- none, varices- none           Lung- wheeze-none, unlabored, cough-none, dullness-none, rub- none,            Chest wall-  +R VATS  scars s/p thymectomy. No rub. Abd-  Br/ Gen/ Rectal- Not done, not indicated Extrem-  Neuro- grossly intact to observation

## 2020-02-10 NOTE — Telephone Encounter (Signed)
1.Medication Requested:HYDROcodone-acetaminophen (NORCO) 10-325 MG tablet  2. Pharmacy (Name, Street, City):[id number] - [place], [address]  3. On Med List: Yes   4. Last Visit with PCP: 7.6.21   5. Next visit date with PCP: 10.6.21    Agent: Please be advised that RX refills may take up to 3 business days. We ask that you follow-up with your pharmacy.

## 2020-02-10 NOTE — Patient Instructions (Signed)
Sample x 2 Breztri inhaler     Inhale 2 puffs then rinse mouth, twice every day .   If this helps your breathing, then let me know.   Please call if we can help

## 2020-02-11 ENCOUNTER — Other Ambulatory Visit: Payer: Self-pay | Admitting: Internal Medicine

## 2020-02-12 NOTE — Assessment & Plan Note (Signed)
Uncomplicated currently. We discussed a trial of Breztri, but not sure there is an active bronchitis component now. She understands to moderate her exposure to heat and humidity outdoors. Plan- samples [employer]

## 2020-02-12 NOTE — Assessment & Plan Note (Signed)
She has used xanax for years to manage this. She understands I am not the right one to manage her mental health issues and she needs to follow advice of her PCP.

## 2020-02-13 DIAGNOSIS — Z1231 Encounter for screening mammogram for malignant neoplasm of breast: Secondary | ICD-10-CM | POA: Diagnosis not present

## 2020-02-13 DIAGNOSIS — Z779 Other contact with and (suspected) exposures hazardous to health: Secondary | ICD-10-CM | POA: Diagnosis not present

## 2020-02-13 DIAGNOSIS — Z118 Encounter for screening for other infectious and parasitic diseases: Secondary | ICD-10-CM | POA: Diagnosis not present

## 2020-02-13 DIAGNOSIS — Z1159 Encounter for screening for other viral diseases: Secondary | ICD-10-CM | POA: Diagnosis not present

## 2020-02-14 ENCOUNTER — Other Ambulatory Visit: Payer: Self-pay | Admitting: Internal Medicine

## 2020-02-14 DIAGNOSIS — F331 Major depressive disorder, recurrent, moderate: Secondary | ICD-10-CM

## 2020-02-17 DIAGNOSIS — J4520 Mild intermittent asthma, uncomplicated: Secondary | ICD-10-CM | POA: Diagnosis not present

## 2020-02-17 DIAGNOSIS — J45909 Unspecified asthma, uncomplicated: Secondary | ICD-10-CM | POA: Diagnosis not present

## 2020-02-17 DIAGNOSIS — J449 Chronic obstructive pulmonary disease, unspecified: Secondary | ICD-10-CM | POA: Diagnosis not present

## 2020-02-18 ENCOUNTER — Ambulatory Visit: Payer: Medicare Other | Admitting: Internal Medicine

## 2020-02-19 ENCOUNTER — Ambulatory Visit: Payer: Medicare Other | Admitting: Cardiology

## 2020-02-21 ENCOUNTER — Other Ambulatory Visit: Payer: Self-pay

## 2020-02-21 MED ORDER — VITAMIN B 12 500 MCG PO TABS
1000.0000 ug | ORAL_TABLET | Freq: Every day | ORAL | 5 refills | Status: DC
Start: 2020-02-21 — End: 2020-12-29

## 2020-03-09 ENCOUNTER — Telehealth: Payer: Self-pay | Admitting: Internal Medicine

## 2020-03-09 ENCOUNTER — Other Ambulatory Visit: Payer: Self-pay | Admitting: Internal Medicine

## 2020-03-09 DIAGNOSIS — M159 Polyosteoarthritis, unspecified: Secondary | ICD-10-CM

## 2020-03-09 DIAGNOSIS — M5030 Other cervical disc degeneration, unspecified cervical region: Secondary | ICD-10-CM

## 2020-03-09 MED ORDER — HYDROCODONE-ACETAMINOPHEN 10-325 MG PO TABS: 1.0000 | ORAL_TABLET | ORAL | 0 refills | Status: DC | PRN

## 2020-03-09 NOTE — Telephone Encounter (Signed)
Pt called and said that she would be out of her pain medicine tomorrow. HYDROcodone-acetaminophen (NORCO) 10-325 MG tablet and was wondering if she could get a refill sent in. [id number] [address], [address] [place] [address]

## 2020-03-16 DIAGNOSIS — J4520 Mild intermittent asthma, uncomplicated: Secondary | ICD-10-CM | POA: Diagnosis not present

## 2020-03-16 DIAGNOSIS — J45909 Unspecified asthma, uncomplicated: Secondary | ICD-10-CM | POA: Diagnosis not present

## 2020-04-06 ENCOUNTER — Telehealth: Payer: Self-pay | Admitting: Internal Medicine

## 2020-04-06 NOTE — Telephone Encounter (Signed)
    Patient requesting refill for HYDROcodone-acetaminophen (NORCO) 10-325 MG tablet   Pharmacy [id number] - [place], [place] - [address] 

## 2020-04-07 ENCOUNTER — Other Ambulatory Visit: Payer: Self-pay | Admitting: Internal Medicine

## 2020-04-07 DIAGNOSIS — M159 Polyosteoarthritis, unspecified: Secondary | ICD-10-CM

## 2020-04-07 DIAGNOSIS — M5030 Other cervical disc degeneration, unspecified cervical region: Secondary | ICD-10-CM

## 2020-04-07 MED ORDER — HYDROCODONE-ACETAMINOPHEN 10-325 MG PO TABS: 1.0000 | ORAL_TABLET | ORAL | 0 refills | Status: DC | PRN

## 2020-04-15 ENCOUNTER — Telehealth: Payer: Self-pay | Admitting: Internal Medicine

## 2020-04-15 DIAGNOSIS — J4520 Mild intermittent asthma, uncomplicated: Secondary | ICD-10-CM

## 2020-04-15 MED ORDER — ALBUTEROL SULFATE (2.5 MG/3ML) 0.083% IN NEBU: INHALATION_SOLUTION | RESPIRATORY_TRACT | 11 refills | Status: DC

## 2020-04-15 NOTE — Telephone Encounter (Signed)
Ok to order replacement compressor nebulizer machine for dx COPD mixed type

## 2020-04-15 NOTE — Telephone Encounter (Signed)
Called and spoke with patient.  Advised her that a prescription for a nebulizer machine has been sent to [facility].  She was inquiring about a new script for Albuterol for her nebulizer machine.  I let her know that had been sent as well.  Nothing further needed.

## 2020-04-15 NOTE — Telephone Encounter (Signed)
Spoke with patient regarding prior message. Patient would like a script sent into [facility] for a new Nebulizer machine. Already sent in Albuterol nebs to [facility] .  Dr.Young can you please advise    Thank you

## 2020-04-16 ENCOUNTER — Other Ambulatory Visit: Payer: Self-pay | Admitting: Internal Medicine

## 2020-04-16 DIAGNOSIS — J4520 Mild intermittent asthma, uncomplicated: Secondary | ICD-10-CM

## 2020-04-20 DIAGNOSIS — J4520 Mild intermittent asthma, uncomplicated: Secondary | ICD-10-CM | POA: Diagnosis not present

## 2020-04-20 DIAGNOSIS — J449 Chronic obstructive pulmonary disease, unspecified: Secondary | ICD-10-CM | POA: Diagnosis not present

## 2020-04-21 ENCOUNTER — Ambulatory Visit: Payer: Medicare Other | Admitting: Cardiology

## 2020-04-29 ENCOUNTER — Encounter: Payer: Self-pay | Admitting: Internal Medicine

## 2020-04-29 ENCOUNTER — Other Ambulatory Visit: Payer: Self-pay

## 2020-04-29 ENCOUNTER — Ambulatory Visit (INDEPENDENT_AMBULATORY_CARE_PROVIDER_SITE_OTHER): Payer: Medicare Other | Admitting: Internal Medicine

## 2020-04-29 VITALS — BP 136/80 | HR 68 | Temp 97.8°F | Ht 66.0 in | Wt 205.0 lb

## 2020-04-29 DIAGNOSIS — I700 Atherosclerosis of aorta: Secondary | ICD-10-CM

## 2020-04-29 DIAGNOSIS — J449 Chronic obstructive pulmonary disease, unspecified: Secondary | ICD-10-CM | POA: Diagnosis not present

## 2020-04-29 DIAGNOSIS — E041 Nontoxic single thyroid nodule: Secondary | ICD-10-CM

## 2020-04-29 DIAGNOSIS — E118 Type 2 diabetes mellitus with unspecified complications: Secondary | ICD-10-CM | POA: Diagnosis not present

## 2020-04-29 DIAGNOSIS — I10 Essential (primary) hypertension: Secondary | ICD-10-CM

## 2020-04-29 NOTE — Progress Notes (Signed)
Subjective:  Patient ID: Felicia Odom, female    DOB: 30-Nov-1955  Age: 64 y.o. MRN: [id number]  CC: Hypertension and COPD  This visit occurred during the SARS-CoV-2 public health emergency.  Safety protocols were in place, including screening questions prior to the visit, additional usage of staff PPE, and extensive cleaning of exam room while observing appropriate contact time as indicated for disinfecting solutions.    HPI Felicia Odom presents for f/up - She continues to complain of cough that is nonproductive with wheezing and shortness of breath.  I had prescribed a combination of a LAMA/LABA/ICS but she is not using it.  She tells me she is only willing to use the albuterol inhaler.  She tells me she is not willing to take a statin.  Outpatient Medications Prior to Visit  Medication Sig Dispense Refill  . albuterol (PROAIR HFA) 108 (90 Base) MCG/ACT inhaler INHALE 1 TO 2 PUFFS INTO THE LUNGS EVERY 4 HOURS AS NEEDED FOR WHEEZING OR SHORTNESS OF BREATH 18 g 12  . albuterol (PROVENTIL) (2.5 MG/3ML) 0.083% nebulizer solution USE 1 VIAL IN NEBULIZER EVERY 6 HOURS - And As Needed 75 mL 11  . ALPRAZolam (XANAX) 1 MG tablet Take 1 tablet (1 mg total) by mouth every 6 (six) hours as needed. 120 tablet 3  . aspirin EC 81 MG tablet Take 81 mg by mouth daily.    . Cyanocobalamin (VITAMIN B 12) 500 MCG TABS Take 1,000 mcg by mouth daily. 60 tablet 5  . esomeprazole (NEXIUM) 40 MG capsule TAKE 1 CAPSULE BY MOUTH EVERY DAY 90 capsule 1  . folic acid (FOLVITE) 1 MG tablet Take 1 mg by mouth daily.    Marland Kitchen HYDROcodone-acetaminophen (NORCO) 10-325 MG tablet Take 1 tablet by mouth every 4 (four) hours as needed (for pain). 90 tablet 0  . ibuprofen (ADVIL) 800 MG tablet TAKE 1 TABLET BY MOUTH EVERY 8 HOURS AS NEEDED 180 tablet 1  . escitalopram (LEXAPRO) 5 MG tablet TAKE 1 TABLET BY MOUTH EVERY DAY (Patient not taking: Reported on 04/29/2020) 90 tablet 0  . Budeson-Glycopyrrol-Formoterol  (BREZTRI AEROSPHERE) 160-9-4.8 MCG/ACT AERO Inhale 2 puffs into the lungs in the morning and at bedtime. (Patient not taking: Reported on 04/29/2020) 10.7 g 0  . rosuvastatin (CRESTOR) 5 MG tablet Take 1 tablet (5 mg total) by mouth daily. (Patient not taking: Reported on 04/29/2020) 90 tablet 3   No facility-administered medications prior to visit.    ROS Review of Systems  Constitutional: Negative.  Negative for chills, diaphoresis, fatigue and fever.  HENT: Negative.   Eyes: Negative for visual disturbance.  Respiratory: Positive for cough, shortness of breath and wheezing. Negative for choking, chest tightness and stridor.   Cardiovascular: Negative for chest pain, palpitations and leg swelling.  Gastrointestinal: Negative for abdominal pain, constipation, diarrhea, nausea and vomiting.  Endocrine: Negative.   Genitourinary: Negative.  Negative for difficulty urinating, dysuria and hematuria.  Musculoskeletal: Positive for arthralgias and myalgias. Negative for back pain and neck pain.  Neurological: Negative.  Negative for dizziness, weakness and light-headedness.  Hematological: Negative for adenopathy. Does not bruise/bleed easily.  Psychiatric/Behavioral: Negative for dysphoric mood, self-injury, sleep disturbance and suicidal ideas. The patient is nervous/anxious.     Objective:  BP 136/80   Pulse 68   Temp 97.8 F (36.6 C) (Oral)   Ht 5\' 6"  (1.676 m)   Wt 205 lb (93 kg)   SpO2 98%   BMI 33.09 kg/m   BP Readings from  Last 3 Encounters:  04/29/20 136/80  02/10/20 122/68  01/28/20 128/72    Wt Readings from Last 3 Encounters:  04/29/20 205 lb (93 kg)  02/10/20 202 lb 12.8 oz (92 kg)  01/28/20 206 lb 8 oz (93.7 kg)    Physical Exam Vitals reviewed.  Constitutional:      Appearance: Normal appearance.  HENT:     Nose: Nose normal.     Mouth/Throat:     Mouth: Mucous membranes are moist.  Eyes:     General: No scleral icterus.    Conjunctiva/sclera:  Conjunctivae normal.  Cardiovascular:     Rate and Rhythm: Normal rate and regular rhythm.     Heart sounds: No murmur heard.   Pulmonary:     Effort: Pulmonary effort is normal.     Breath sounds: No stridor. No wheezing, rhonchi or rales.  Abdominal:     General: Abdomen is protuberant. Bowel sounds are normal. There is no distension.     Palpations: Abdomen is soft. There is no fluid wave, hepatomegaly or mass.     Tenderness: There is no abdominal tenderness.  Musculoskeletal:        General: Normal range of motion.     Cervical back: Neck supple.     Right lower leg: No edema.     Left lower leg: No edema.  Lymphadenopathy:     Cervical: No cervical adenopathy.  Skin:    General: Skin is warm and dry.     Coloration: Skin is not pale.  Neurological:     General: No focal deficit present.     Mental Status: She is alert.  Psychiatric:        Mood and Affect: Mood normal.        Behavior: Behavior normal.     Lab Results  Component Value Date   WBC 5.3 01/28/2020   HGB 14.8 01/28/2020   HCT 43.1 01/28/2020   PLT 235.0 01/28/2020   GLUCOSE 117 (H) 01/28/2020   CHOL 186 01/28/2020   TRIG 196.0 (H) 01/28/2020   HDL 42.60 01/28/2020   LDLDIRECT 142.0 01/21/2015   LDLCALC 104 (H) 01/28/2020   ALT 23 08/08/2019   AST 20 08/08/2019   NA 140 01/28/2020   K 4.0 01/28/2020   CL 102 01/28/2020   CREATININE 0.70 01/28/2020   BUN 11 01/28/2020   CO2 30 01/28/2020   TSH 2.10 08/08/2019   INR 0.89 06/08/2016   HGBA1C 6.2 01/28/2020   MICROALBUR <0.7 08/19/2019    CT CHEST LUNG CA SCREEN LOW DOSE W/O CM  Result Date: 07/15/2019 CLINICAL DATA:  64 year old female with 33 pack-year history of smoking. Lung cancer screening. EXAM: CT CHEST WITHOUT CONTRAST LOW-DOSE FOR LUNG CANCER SCREENING TECHNIQUE: Multidetector CT imaging of the chest was performed following the standard protocol without IV contrast. COMPARISON:  07/11/2018 FINDINGS: Cardiovascular: The heart size is  normal. No substantial pericardial effusion. No discernible coronary artery calcification. Atherosclerotic calcification is noted in the wall of the thoracic aorta. Mediastinum/Nodes: Stable small right thyroid nodule. No mediastinal lymphadenopathy. No evidence for gross hilar lymphadenopathy although assessment is limited by the lack of intravenous contrast on today's study. The esophagus has normal imaging features. There is no axillary lymphadenopathy. Lungs/Pleura: Tiny right apical nodule identified measuring only 1.5 mm. No suspicious pulmonary nodule or mass. No focal airspace consolidation. No pleural effusion. Upper Abdomen: Unremarkable. Musculoskeletal: No worrisome lytic or sclerotic osseous abnormality. IMPRESSION: 1. Lung-RADS 2, benign appearance or behavior. Continue annual screening  with low-dose chest CT without contrast in 12 months. 2.  Emphysema. (ICD10-J43.9) 3.  Aortic Atherosclerois (ICD10-170.0) Electronically Signed   By: Misty Stanley M.D.   On: 07/15/2019 11:22    Assessment & Plan:   Yaslene was seen today for hypertension and copd.  Diagnoses and all orders for this visit:  Type II diabetes mellitus with manifestations ([place])- Her blood sugars have been adequately well controlled.  Asthma with COPD ([place])- She is not willing to use the LAMA/LABA/ICS inhaler.  She refused vaccines against pneumonia and influenza.  Essential hypertension- Her blood pressure is adequately well controlled.  Atherosclerosis of aorta ([place])- She is not willing to take a statin for cardiovascular risk reduction.   I have discontinued Deltha Bernales. Klutz's rosuvastatin and [employer]. I am also having her maintain her aspirin EC, folic acid, albuterol, esomeprazole, ibuprofen, ALPRAZolam, escitalopram, Vitamin B 12, HYDROcodone-acetaminophen, and albuterol.  No orders of the defined types were placed in this encounter.    Follow-up: No follow-ups on file.  Scarlette Calico, MD

## 2020-05-03 ENCOUNTER — Encounter: Payer: Self-pay | Admitting: Internal Medicine

## 2020-05-03 NOTE — Patient Instructions (Signed)

## 2020-05-05 ENCOUNTER — Other Ambulatory Visit: Payer: Self-pay | Admitting: Internal Medicine

## 2020-05-05 ENCOUNTER — Telehealth: Payer: Self-pay | Admitting: Internal Medicine

## 2020-05-05 DIAGNOSIS — M5030 Other cervical disc degeneration, unspecified cervical region: Secondary | ICD-10-CM

## 2020-05-05 DIAGNOSIS — M159 Polyosteoarthritis, unspecified: Secondary | ICD-10-CM

## 2020-05-05 MED ORDER — HYDROCODONE-ACETAMINOPHEN 10-325 MG PO TABS: 1.0000 | ORAL_TABLET | ORAL | 0 refills | Status: DC | PRN

## 2020-05-05 NOTE — Telephone Encounter (Signed)
    Patient requesting refill for HYDROcodone-acetaminophen (NORCO) 10-325 MG tablet   Pharmacy [id number] - [place], [place] - [address] 

## 2020-05-12 DIAGNOSIS — J45909 Unspecified asthma, uncomplicated: Secondary | ICD-10-CM | POA: Diagnosis not present

## 2020-05-15 DIAGNOSIS — L570 Actinic keratosis: Secondary | ICD-10-CM | POA: Diagnosis not present

## 2020-05-15 DIAGNOSIS — I788 Other diseases of capillaries: Secondary | ICD-10-CM | POA: Diagnosis not present

## 2020-05-17 ENCOUNTER — Other Ambulatory Visit: Payer: Self-pay | Admitting: Internal Medicine

## 2020-05-18 ENCOUNTER — Telehealth: Payer: Self-pay | Admitting: Internal Medicine

## 2020-05-18 NOTE — Telephone Encounter (Signed)
Alprazolam refill e-sent

## 2020-05-18 NOTE — Telephone Encounter (Signed)
ATC patient to let her know that prescription has been refilled for her and is ready for pick up per pharmacy, [facility]

## 2020-05-18 NOTE — Telephone Encounter (Signed)
Pt is requesting refill ALPRAZOLAM 1 MG TABLET next ov 08/13/19

## 2020-05-19 NOTE — Telephone Encounter (Signed)
Left detailed message about medication sent to pharmacy. Will sign off.

## 2020-05-20 ENCOUNTER — Telehealth: Payer: Self-pay | Admitting: Internal Medicine

## 2020-05-20 DIAGNOSIS — J4520 Mild intermittent asthma, uncomplicated: Secondary | ICD-10-CM | POA: Diagnosis not present

## 2020-05-20 NOTE — Telephone Encounter (Signed)
Patient is aware of below message and voiced her understanding.  Nothing further needed.   

## 2020-05-20 NOTE — Telephone Encounter (Signed)
Okay then thanks. We can leave off the Advair.

## 2020-05-20 NOTE — Telephone Encounter (Signed)
Spoke to patient and relayed below message/recommendations.  Patient stated that she uses albuterol HFA 1-2 daily and some days she does not have to use it at all.  She stated that [facility]. She stated that she has not been requesting these refills. She does not feel that she needs a maintenance inhaler.   Dr. Annamaria Boots, please advise. Thanks

## 2020-05-20 NOTE — Telephone Encounter (Signed)
[facility] [place] reporting she has been refilling her albuterol rescue inhaler multiple times. I recommend she go back to using Advair routinely as a maintenance inhaler. She can still use the albuterol rescue inhaler up to 2 puffs every 6 hours if needed.  Please  Order  Advair 100, # 1   Inhale 1 puff then rinse mouth, twice daily    Ref x 12

## 2020-05-28 ENCOUNTER — Other Ambulatory Visit: Payer: Self-pay | Admitting: Internal Medicine

## 2020-05-28 DIAGNOSIS — M159 Polyosteoarthritis, unspecified: Secondary | ICD-10-CM

## 2020-05-28 DIAGNOSIS — M5030 Other cervical disc degeneration, unspecified cervical region: Secondary | ICD-10-CM

## 2020-05-28 DIAGNOSIS — M8949 Other hypertrophic osteoarthropathy, multiple sites: Secondary | ICD-10-CM

## 2020-05-29 ENCOUNTER — Encounter: Payer: Self-pay | Admitting: General Practice

## 2020-05-29 ENCOUNTER — Telehealth: Payer: Self-pay | Admitting: Cardiology

## 2020-05-29 NOTE — Telephone Encounter (Signed)
°  Attempted 3 times to schedule recall, recall letter sent, recall closed

## 2020-06-01 ENCOUNTER — Telehealth: Payer: Self-pay | Admitting: Internal Medicine

## 2020-06-01 ENCOUNTER — Other Ambulatory Visit: Payer: Self-pay | Admitting: Internal Medicine

## 2020-06-01 DIAGNOSIS — K219 Gastro-esophageal reflux disease without esophagitis: Secondary | ICD-10-CM

## 2020-06-01 DIAGNOSIS — F331 Major depressive disorder, recurrent, moderate: Secondary | ICD-10-CM

## 2020-06-01 NOTE — Telephone Encounter (Signed)
HYDROcodone-acetaminophen (NORCO) 10-325 MG tablet [id number] - [place], [place]. Phone:  [phone]  Fax:  [phone]     Requesting a refill

## 2020-06-02 ENCOUNTER — Telehealth: Payer: Self-pay | Admitting: Acute Care

## 2020-06-02 ENCOUNTER — Other Ambulatory Visit: Payer: Self-pay | Admitting: Internal Medicine

## 2020-06-02 ENCOUNTER — Telehealth: Payer: Self-pay | Admitting: Internal Medicine

## 2020-06-02 DIAGNOSIS — M5030 Other cervical disc degeneration, unspecified cervical region: Secondary | ICD-10-CM

## 2020-06-02 DIAGNOSIS — M159 Polyosteoarthritis, unspecified: Secondary | ICD-10-CM

## 2020-06-02 DIAGNOSIS — M8949 Other hypertrophic osteoarthropathy, multiple sites: Secondary | ICD-10-CM

## 2020-06-02 MED ORDER — HYDROCODONE-ACETAMINOPHEN 10-325 MG PO TABS: 1.0000 | ORAL_TABLET | ORAL | 0 refills | Status: DC | PRN

## 2020-06-02 NOTE — Telephone Encounter (Signed)
Patient is requesting a refill on the following medication. She states she has not use it, But the one she has is out of date.  NARCAN 4 MG/0.1ML LIQD nasal spray kit  [id number] [place], [place] - [address] Phone:  [phone] Fax:  [phone]

## 2020-06-03 ENCOUNTER — Other Ambulatory Visit: Payer: Self-pay | Admitting: Internal Medicine

## 2020-06-03 DIAGNOSIS — Z79891 Long term (current) use of opiate analgesic: Secondary | ICD-10-CM

## 2020-06-03 MED ORDER — NALOXONE HCL 4 MG/0.1ML NA LIQD: 1.0000 | Freq: Once | NASAL | 5 refills | Status: AC

## 2020-06-03 NOTE — Telephone Encounter (Signed)
We are doing the low dose. If she needs something more we will order it after seeing the results of the low dose. Thanks [employer]

## 2020-06-03 NOTE — Telephone Encounter (Signed)
Spoke with pt. She is wanting to know if she needs to have a contrasted chest CT instead of lung screening CT due to findings listed on previous Ct scans. 07/15/19 Scan: Mediastinum/Nodes: Stable small right thyroid nodule. No mediastinal lymphadenopathy. No evidence for gross hilar lymphadenopathy although assessment is limited by the lack of intravenous contrast on today's study. The esophagus has normal imaging features. There is no axillary lymphadenopathy.  Lungs/Pleura: Tiny right apical nodule identified measuring only 1.5 mm. No suspicious pulmonary nodule or mass. No focal airspace consolidation. No pleural effusion.  Pt had biopsy/ surgery for Thymoma with Dr Jobie Quaker back in 2017. Pt states something still doesn't feel right in her lungs. Please advise.

## 2020-06-03 NOTE — Telephone Encounter (Signed)
Spoke with pt and advised of recommendations per Tish Men. Pt verbalized understanding and is aware that Rodena Piety will be calling her soon to schedule CT. Nothing further needed at this time.

## 2020-06-04 DIAGNOSIS — J449 Chronic obstructive pulmonary disease, unspecified: Secondary | ICD-10-CM | POA: Diagnosis not present

## 2020-06-11 ENCOUNTER — Telehealth: Payer: Self-pay | Admitting: Internal Medicine

## 2020-06-11 MED ORDER — AZITHROMYCIN 250 MG PO TABS: 250.0000 mg | ORAL_TABLET | ORAL | 0 refills | Status: DC

## 2020-06-11 NOTE — Telephone Encounter (Signed)
lmtcb for pt.  

## 2020-06-11 NOTE — Telephone Encounter (Signed)
Patient is returning phone call. Patient phone number is [phone].

## 2020-06-11 NOTE — Telephone Encounter (Signed)
ATC patient.  LMTCB. 

## 2020-06-11 NOTE — Telephone Encounter (Signed)
Ok to send Zpak 250 mg, # 6, 2 today then one daily thanks

## 2020-06-11 NOTE — Telephone Encounter (Signed)
Tried calling the pt and there was no answer, LMTCB.  

## 2020-06-11 NOTE — Telephone Encounter (Signed)
Spoke with the pt and notified of response per Dr Annamaria Boots  Rx was sent to [facility]

## 2020-06-11 NOTE — Telephone Encounter (Signed)
Patient is returning phone call. Patient phone number is [phone]. May leave detailed message on voicemail.

## 2020-06-11 NOTE — Telephone Encounter (Signed)
Spoke with the pt  She is c/o cough with green sputum x 2 days  She states that she feels fatigued  She is not SOB or having any chest tightness, wheezing, f/c/s  She is vaccinated against covid  She is requesting a zpack  Please advise thanks!  Allergies  Allergen Reactions  . No Known Allergies    Current Outpatient Medications on File Prior to Visit  Medication Sig Dispense Refill  . albuterol (PROAIR HFA) 108 (90 Base) MCG/ACT inhaler INHALE 1 TO 2 PUFFS INTO THE LUNGS EVERY 4 HOURS AS NEEDED FOR WHEEZING OR SHORTNESS OF BREATH 18 g 12  . albuterol (PROVENTIL) (2.5 MG/3ML) 0.083% nebulizer solution USE 1 VIAL IN NEBULIZER EVERY 6 HOURS - And As Needed 75 mL 11  . ALPRAZolam (XANAX) 1 MG tablet TAKE 1 TABLET BY MOUTH EVERY 6 HOURS AS NEEDED. 120 tablet 3  . aspirin EC 81 MG tablet Take 81 mg by mouth daily.    . Cyanocobalamin (VITAMIN B 12) 500 MCG TABS Take 1,000 mcg by mouth daily. 60 tablet 5  . escitalopram (LEXAPRO) 5 MG tablet TAKE 1 TABLET BY MOUTH EVERY DAY 90 tablet 0  . esomeprazole (NEXIUM) 40 MG capsule TAKE 1 CAPSULE BY MOUTH EVERY DAY 90 capsule 1  . folic acid (FOLVITE) 1 MG tablet Take 1 mg by mouth daily.    Marland Kitchen HYDROcodone-acetaminophen (NORCO) 10-325 MG tablet Take 1 tablet by mouth every 4 (four) hours as needed (for pain). 90 tablet 0  . ibuprofen (ADVIL) 800 MG tablet TAKE 1 TABLET BY MOUTH EVERY 8 HOURS AS NEEDED 180 tablet 1  . [DISCONTINUED] progesterone (PROMETRIUM) 100 MG capsule Take 100 mg by mouth daily.       No current facility-administered medications on file prior to visit.

## 2020-06-11 NOTE — Telephone Encounter (Signed)
Patient is returning phone call. Patient phone number is [phone].

## 2020-06-20 DIAGNOSIS — J4520 Mild intermittent asthma, uncomplicated: Secondary | ICD-10-CM | POA: Diagnosis not present

## 2020-06-23 NOTE — Progress Notes (Signed)
Virtual Visit via Video Note changed to phone visit at patient request.   This visit type was conducted due to national recommendations for restrictions regarding the COVID-19 Pandemic (e.g. social distancing) in an effort to limit this patient's exposure and mitigate transmission in our community.  Due to her co-morbid illnesses, this patient is at least at moderate risk for complications without adequate follow up.  This format is felt to be most appropriate for this patient at this time.  All issues noted in this document were discussed and addressed.  A limited physical exam was performed with this format.  Please refer to the patient's chart for her consent to telehealth for [facility].      Date:  06/26/2020   ID:  Felicia Odom, DOB May 04, 1956, MRN [id number]  Patient Location:Home Provider Location: Home  PCP:  [name], MD  Cardiologist:  Dr Stanford Breed  Evaluation Performed:  Follow-Up Visit  Chief Complaint:  FU Palpitations  History of Present Illness:    FUpalpitations. Nuclear study October 2017 showed ejection fraction 56%, probable shifting breast attenuation, cannot rule out mild apical ischemia. Patient had previous resection of thymoma. Chest CT December 2018 showed no residual mass. There was note of aortic atherosclerosis. Monitor 1/19 showed sinus rhythm.Last echocardiogram September 2020 showed normal LV function, mild left ventricular hypertrophy, grade 1 diastolic dysfunction, mild mitral valve prolapse with moderate mitral regurgitation, mild to moderate tricuspid regurgitation, mild to moderate aortic stenosis with mean gradient 13 mmHg and calculated aortic valve area 1.23 cm, trace aortic insufficiency.  Since last seenshe denies dyspnea, chest pain or syncope.  Occasional brief palpitations but not particularly bothersome.  The patient does not have symptoms concerning for COVID-19 infection (fever, chills, cough, or new shortness of  breath).    Past Medical History:  Diagnosis Date  . Anxiety   . Arthritis    neck  . Asthma   . Chronic back pain   . Chronic bronchitis ([place])   . Chronic neck pain   . COPD (chronic obstructive pulmonary disease) ([facility])    Advair and Albuterol as needed as well as neb  . Diverticulosis   . GERD (gastroesophageal reflux disease)    takes Nexium daily  . Helicobacter pylori gastritis 08/2019   Tx and eradicated  . History of blood transfusion    in the 70's. No abnormal reaction noted  . History of colon polyps    benign  . Hyperlipidemia    not on any meds bc refused to pick up from pharmacy  . Joint pain   . Joint swelling   . Mediastinal tumor   . Nocturia   . OCD (obsessive compulsive disorder)   . Panic disorder    takes Xanax daily as needed  . Pneumonia    2016  . PONV (postoperative nausea and vomiting)   . Seizures ([place])    in the 80's   Past Surgical History:  Procedure Laterality Date  . COLONOSCOPY    . ECTOPIC PREGNANCY SURGERY     x 2   . ESOPHAGOGASTRODUODENOSCOPY    . LAPAROSCOPIC LYSIS INTESTINAL ADHESIONS     x 2  . RESECTION OF MEDIASTINAL MASS N/A 06/10/2016   Procedure: RESECTION OF MEDIASTINAL MASS;  Surgeon: Grace Isaac, MD;  Location: [facility];  Service: Thoracic;  Laterality: N/A;  . VIDEO ASSISTED THORACOSCOPY Right 06/10/2016   Procedure: VIDEO ASSISTED THORACOSCOPY WITH PLACEMENT OF ON Q TUNNELER;  Surgeon: Grace Isaac, MD;  Location: [facility];  Service: Thoracic;  Laterality: Right;  Marland Kitchen VIDEO BRONCHOSCOPY N/A 06/10/2016   Procedure: VIDEO BRONCHOSCOPY;  Surgeon: Grace Isaac, MD;  Location: [facility];  Service: Thoracic;  Laterality: N/A;     Current Meds  Medication Sig  . albuterol (PROAIR HFA) 108 (90 Base) MCG/ACT inhaler INHALE 1 TO 2 PUFFS INTO THE LUNGS EVERY 4 HOURS AS NEEDED FOR WHEEZING OR SHORTNESS OF BREATH  . albuterol (PROVENTIL) (2.5 MG/3ML) 0.083% nebulizer solution USE 1 VIAL IN NEBULIZER EVERY 6 HOURS - And  As Needed  . ALPRAZolam (XANAX) 1 MG tablet TAKE 1 TABLET BY MOUTH EVERY 6 HOURS AS NEEDED.  Marland Kitchen esomeprazole (NEXIUM) 40 MG capsule TAKE 1 CAPSULE BY MOUTH EVERY DAY  . folic acid (FOLVITE) 1 MG tablet Take 1 mg by mouth daily.  Marland Kitchen HYDROcodone-acetaminophen (NORCO) 10-325 MG tablet Take 1 tablet by mouth every 4 (four) hours as needed (for pain).  Marland Kitchen ibuprofen (ADVIL) 800 MG tablet TAKE 1 TABLET BY MOUTH EVERY 8 HOURS AS NEEDED     Allergies:   No known allergies   Social History   Tobacco Use  . Smoking status: Former Smoker    Packs/day: 1.00    Years: 36.00    Pack years: 36.00    Types: Cigarettes    Quit date: 2017    Years since quitting: 4.9  . Smokeless tobacco: Never Used  . Tobacco comment: last cigarette 2017  Vaping Use  . Vaping Use: Never used  Substance Use Topics  . Alcohol use: No    Alcohol/week: 0.0 standard drinks  . Drug use: No     Family Hx: The patient's family history includes Breast cancer in her sister; Clotting disorder in her mother; Colon cancer in her sister; Diabetes in her mother; Heart attack in her father; Heart failure in her father and mother; Kidney disease in her mother; Prostate cancer in her father. There is no history of Esophageal cancer, Rectal cancer, or Stomach cancer.  ROS:   Please see the history of present illness.    No Fever, chills  or productive cough All other systems reviewed and are negative.  Recent Labs: 08/08/2019: ALT 23; TSH 2.10 01/28/2020: BUN 11; Creatinine, Ser 0.70; Hemoglobin 14.8; Platelets 235.0; Potassium 4.0; Sodium 140   Recent Lipid Panel Lab Results  Component Value Date/Time   CHOL 186 01/28/2020 09:07 AM   CHOL 196 05/20/2019 08:14 AM   TRIG 196.0 (H) 01/28/2020 09:07 AM   HDL 42.60 01/28/2020 09:07 AM   HDL 46 05/20/2019 08:14 AM   CHOLHDL 4 01/28/2020 09:07 AM   LDLCALC 104 (H) 01/28/2020 09:07 AM   LDLCALC 127 (H) 05/20/2019 08:14 AM   LDLDIRECT 142.0 01/21/2015 03:37 PM    Wt Readings  from Last 3 Encounters:  06/26/20 200 lb (90.7 kg)  04/29/20 205 lb (93 kg)  02/10/20 202 lb 12.8 oz (92 kg)     Objective:    Vital Signs:  Ht 5' 6.5" (1.689 m)   Wt 200 lb (90.7 kg)   BMI 31.80 kg/m    VITAL SIGNS:  reviewed NAD Answers questions appropriately Normal affect Remainder of physical examination not performed (telehealth visit; coronavirus pandemic)  ASSESSMENT & PLAN:    1. Palpitations-symptoms are unchanged.  We will consider addition of a beta-blocker in the future if symptoms worsen. 2. Aortic stenosis/moderate mitral regurgitation-we will plan to repeat echocardiogram. 3. Hyperlipidemia-given history of aortic atherosclerosis we will resume statin.  Begin Crestor 20  mg daily.  Check lipids and liver in 12 weeks. 4. Aortic atherosclerosis-resume statin as outlined above. 5. Obesity-patient states she is working on her weight.  We discussed the importance of continued efforts.  COVID-19 Education: The importance of social distancing was discussed today.  Time:   Today, I have spent 15 minutes with the patient with telehealth technology discussing the above problems.     Medication Adjustments/Labs and Tests Ordered: Current medicines are reviewed at length with the patient today.  Concerns regarding medicines are outlined above.   Tests Ordered: No orders of the defined types were placed in this encounter.   Medication Changes: No orders of the defined types were placed in this encounter.   Follow Up:  In Person in 1 year(s)  Signed, Kirk Ruths, MD  06/26/2020 9:17 AM    [place]

## 2020-06-26 ENCOUNTER — Telehealth (INDEPENDENT_AMBULATORY_CARE_PROVIDER_SITE_OTHER): Payer: Medicare Other | Admitting: Cardiology

## 2020-06-26 ENCOUNTER — Encounter: Payer: Self-pay | Admitting: Cardiology

## 2020-06-26 VITALS — Ht 66.5 in | Wt 200.0 lb

## 2020-06-26 DIAGNOSIS — I350 Nonrheumatic aortic (valve) stenosis: Secondary | ICD-10-CM

## 2020-06-26 DIAGNOSIS — E785 Hyperlipidemia, unspecified: Secondary | ICD-10-CM | POA: Diagnosis not present

## 2020-06-26 DIAGNOSIS — R002 Palpitations: Secondary | ICD-10-CM | POA: Diagnosis not present

## 2020-06-26 DIAGNOSIS — I700 Atherosclerosis of aorta: Secondary | ICD-10-CM

## 2020-06-26 MED ORDER — ROSUVASTATIN CALCIUM 20 MG PO TABS: 20.0000 mg | ORAL_TABLET | Freq: Every day | ORAL | 3 refills | Status: DC

## 2020-06-26 NOTE — Patient Instructions (Signed)
Medication Instructions:   START ROSUVASTATIN 20 MG ONCE DAILY  *If you need a refill on your cardiac medications before your next appointment, please call your pharmacy*   Lab Work:  Your physician recommends that you return for lab work in: [place]  If you have labs (blood work) drawn today and your tests are completely normal, you will receive your results only by: Marland Kitchen MyChart Message (if you have MyChart) OR . A paper copy in the mail If you have any lab test that is abnormal or we need to change your treatment, we will call you to review the results.   Testing/Procedures:  Your physician has requested that you have an echocardiogram. Echocardiography is a painless test that uses sound waves to create images of your heart. It provides your doctor with information about the size and shape of your heart and how well your heart's chambers and valves are working. This procedure takes approximately one hour. There are no restrictions for this procedure.[place]     Follow-Up: At [facility], you and your health needs are our priority.  As part of our continuing mission to provide you with exceptional heart care, we have created designated Provider Care Teams.  These Care Teams include your primary Cardiologist (physician) and Advanced Practice Providers (APPs -  Physician Assistants and Nurse Practitioners) who all work together to provide you with the care you need, when you need it.  We recommend signing up for the patient portal called "MyChart".  Sign up information is provided on this After Visit Summary.  MyChart is used to connect with patients for Virtual Visits (Telemedicine).  Patients are able to view lab/test results, encounter notes, upcoming appointments, etc.  Non-urgent messages can be sent to your provider as well.   To learn more about what you can do with MyChart, go to [email or web address].    Your next appointment:   12 month(s)  The  format for your next appointment:   In Person  Provider:   Kirk Ruths, MD

## 2020-06-29 ENCOUNTER — Telehealth: Payer: Self-pay | Admitting: Internal Medicine

## 2020-06-29 DIAGNOSIS — J449 Chronic obstructive pulmonary disease, unspecified: Secondary | ICD-10-CM | POA: Diagnosis not present

## 2020-06-29 NOTE — Telephone Encounter (Signed)
Patient is requesting a med refill for HYDROcodone-acetaminophen (NORCO) 10-325 MG tablet  It can be sent to [id number] - [place], [place] - [address].

## 2020-06-30 ENCOUNTER — Ambulatory Visit (HOSPITAL_COMMUNITY): Payer: Medicare Other | Attending: Internal Medicine

## 2020-06-30 ENCOUNTER — Other Ambulatory Visit: Payer: Self-pay

## 2020-06-30 ENCOUNTER — Other Ambulatory Visit: Payer: Self-pay | Admitting: Internal Medicine

## 2020-06-30 DIAGNOSIS — M159 Polyosteoarthritis, unspecified: Secondary | ICD-10-CM

## 2020-06-30 DIAGNOSIS — M8949 Other hypertrophic osteoarthropathy, multiple sites: Secondary | ICD-10-CM

## 2020-06-30 DIAGNOSIS — M5030 Other cervical disc degeneration, unspecified cervical region: Secondary | ICD-10-CM

## 2020-06-30 DIAGNOSIS — I350 Nonrheumatic aortic (valve) stenosis: Secondary | ICD-10-CM | POA: Diagnosis not present

## 2020-06-30 LAB — ECHOCARDIOGRAM COMPLETE
AR max vel: 1.07 cm2
AV Area VTI: 1.16 cm2
AV Area mean vel: 1.06 cm2
AV Mean grad: 14.2 mmHg
AV Peak grad: 23.1 mmHg
Ao pk vel: 2.4 m/s
Area-P 1/2: 2.22 cm2
P 1/2 time: 413 msec
S' Lateral: 2.6 cm

## 2020-06-30 MED ORDER — HYDROCODONE-ACETAMINOPHEN 10-325 MG PO TABS: 1.0000 | ORAL_TABLET | ORAL | 0 refills | Status: DC | PRN

## 2020-07-03 ENCOUNTER — Other Ambulatory Visit: Payer: Self-pay | Admitting: *Deleted

## 2020-07-03 DIAGNOSIS — I350 Nonrheumatic aortic (valve) stenosis: Secondary | ICD-10-CM

## 2020-07-03 NOTE — Progress Notes (Signed)
echo

## 2020-07-15 ENCOUNTER — Ambulatory Visit
Admission: RE | Admit: 2020-07-15 | Discharge: 2020-07-15 | Disposition: A | Discharge status: Home / Self Care | Payer: Medicare Other | Source: Ambulatory Visit | Attending: Acute Care | Admitting: Acute Care

## 2020-07-15 DIAGNOSIS — Z87891 Personal history of nicotine dependence: Secondary | ICD-10-CM | POA: Diagnosis not present

## 2020-07-15 DIAGNOSIS — J984 Other disorders of lung: Secondary | ICD-10-CM | POA: Diagnosis not present

## 2020-07-15 DIAGNOSIS — J432 Centrilobular emphysema: Secondary | ICD-10-CM | POA: Diagnosis not present

## 2020-07-15 DIAGNOSIS — E041 Nontoxic single thyroid nodule: Secondary | ICD-10-CM | POA: Diagnosis not present

## 2020-07-18 ENCOUNTER — Other Ambulatory Visit: Payer: Self-pay | Admitting: Internal Medicine

## 2020-07-20 DIAGNOSIS — J4520 Mild intermittent asthma, uncomplicated: Secondary | ICD-10-CM | POA: Diagnosis not present

## 2020-07-27 ENCOUNTER — Telehealth: Payer: Self-pay | Admitting: Internal Medicine

## 2020-07-27 NOTE — Telephone Encounter (Signed)
   Patient requesting refill for HYDROcodone-acetaminophen (NORCO) 10-325 MG tablet   Pharmacy [id number] - [place], [place] - [address] 

## 2020-07-28 DIAGNOSIS — J449 Chronic obstructive pulmonary disease, unspecified: Secondary | ICD-10-CM | POA: Diagnosis not present

## 2020-07-28 NOTE — Progress Notes (Signed)
Please call patient and let them  know their  low dose Ct was read as a Lung RADS 2: nodules that are benign in appearance and behavior with a very low likelihood of becoming a clinically active cancer due to size or lack of growth. Recommendation per radiology is for a repeat LDCT in 12 months. .Please let them  know we will order and schedule their  annual screening scan for 06/2022. Please let them  know there was notation of CAD on their  scan.  Please remind the patient  that this is a non-gated exam therefore degree or severity of disease  cannot be determined. Please have them  follow up with their PCP regarding potential risk factor modification, dietary therapy or pharmacologic therapy if clinically indicated. Pt.  is  currently on statin therapy. Please place order for annual  screening scan for  06/2022 and fax results to PCP. Thanks so much.  Pt. Is followed by cards.  

## 2020-07-30 ENCOUNTER — Other Ambulatory Visit: Payer: Self-pay | Admitting: *Deleted

## 2020-07-30 ENCOUNTER — Other Ambulatory Visit: Payer: Self-pay | Admitting: Internal Medicine

## 2020-07-30 DIAGNOSIS — E041 Nontoxic single thyroid nodule: Secondary | ICD-10-CM | POA: Insufficient documentation

## 2020-07-30 DIAGNOSIS — Z87891 Personal history of nicotine dependence: Secondary | ICD-10-CM

## 2020-07-30 HISTORY — DX: Nontoxic single thyroid nodule: E04.1

## 2020-08-03 ENCOUNTER — Other Ambulatory Visit: Payer: Self-pay | Admitting: Internal Medicine

## 2020-08-03 DIAGNOSIS — M8949 Other hypertrophic osteoarthropathy, multiple sites: Secondary | ICD-10-CM

## 2020-08-03 DIAGNOSIS — M5030 Other cervical disc degeneration, unspecified cervical region: Secondary | ICD-10-CM

## 2020-08-03 DIAGNOSIS — M159 Polyosteoarthritis, unspecified: Secondary | ICD-10-CM

## 2020-08-03 MED ORDER — HYDROCODONE-ACETAMINOPHEN 10-325 MG PO TABS: 1.0000 | ORAL_TABLET | ORAL | 0 refills | Status: DC | PRN

## 2020-08-03 NOTE — Telephone Encounter (Signed)
Patient calling for status of refill  

## 2020-08-08 ENCOUNTER — Other Ambulatory Visit: Payer: Self-pay | Admitting: Internal Medicine

## 2020-08-08 DIAGNOSIS — M8949 Other hypertrophic osteoarthropathy, multiple sites: Secondary | ICD-10-CM

## 2020-08-08 DIAGNOSIS — M5030 Other cervical disc degeneration, unspecified cervical region: Secondary | ICD-10-CM

## 2020-08-08 DIAGNOSIS — M159 Polyosteoarthritis, unspecified: Secondary | ICD-10-CM

## 2020-08-11 NOTE — Progress Notes (Unsigned)
Patient ID: Felicia Odom, female    DOB: 21-Feb-1956, 65 y.o.   MRN: [id number]  HPI F former smoker followed for COPD, Allergic rhinitis, complicated by Thymoma/ resected, anxiety Office spirometry- 01/29/15 Normal spirometry. FVC 2.74/81%, FEV1 2.11/79%, FEV1/FVC 0.77, FEF 25-75 percent 1.79 Office Spirometry 10/21/2015-slight restriction of exhaled volume, mild obstruction. FVC 2.69/78%, FEV1 2.04/76%, FEV1/FVC 0.76, FEF 25-75 percent 1.68/65% PFT 04/27/2016-minimal obstructive airways disease, minimal diffusion defect, insignificant response to bronchodilator. FVC 2.79/78%, FEV1 2.11/76%, ratio 0.76, FEF 25-75 percent 1.81/72%, TLC 92%, DLCO 76%. .----------------------------------------------------------------------------------------------------   02/10/20- 65 year old female former smoker followed for asthma/bronchitis, allergic rhinitis, complicated by resection Thymoma/ TSGY, OCD/panic, Anxiety/ Depression, GERD, DM2, HTN, Eczema,  -----Shortness of breath with exertion Neb albuterol , Proair hfa,  Had 2 Phizer Covax Dyspneic mainly with hot humid weather . Little cough, no acute issue. Bothersome residual R parasternal pain after thymectomy surgery has finally resolved. Pleaded with me to let her continue alprazolam for anxiety. Discussed, with emphasis on issues of dependence and over-reliance esp as she gets older.  She denies any sense of oversedation. Discussed screening CT- to continue.  CT chest low dose screen 07/15/2019 IMPRESSION: 1. Lung-RADS 2, benign appearance or behavior. Continue annual screening with low-dose chest CT without contrast in 12 months. 2.  Emphysema. (ICD10-J43.9) 3.  Aortic Atherosclerois (ICD10-170.0)  08/12/20- 65 year old female former smoker followed for asthma/bronchitis, allergic rhinitis, complicated by resection Thymoma/ TSGY, OCD/panic, Anxiety/ Depression, GERD, DM2, HTN, Eczema,  -Neb albuterol , Ventolin hfa, Xanax 1 mg Covid vax- 3  Phizer Flu vax- declines -----Doing well, no issues Using rescue hfa 0-2x/ day. No major flares. Does say last few days cough a little worse, sputum green No fever or blood. Asks Zpak. Discussed CT result thyroid nodule with pending [place], and "atherosclerosis". She had put off trying Crestor for fear of myalgia side-effects, saying she wanted to discuss with me first. Has already discussed with cardiology. Always very anxious, easily becomes tearful. Frequent night sweats and nocturia.  CT low dose screen 07/15/20- IMPRESSION: 1. Lung-RADS 2, benign appearance or behavior. Continue annual screening with low-dose chest CT without contrast in 12 months. 2. 2.0 cm low-attenuation right thyroid nodule. Recommend thyroid US (ref: J Am Coll Radiol. 2015 Feb;12(2): 143-50). 3.  Aortic atherosclerosis (ICD10-I70.0). 4.  Emphysema (ICD10-J43.9).  Review of Systems- see HPI + = positive Constitutional:   No-   weight loss, +night sweats, fevers, chills, fatigue, lassitude. HEENT:   No-  headaches, difficulty swallowing, tooth/dental problems, sore throat,       No-  sneezing, itching, ear ache, nasal congestion, post nasal drip,  CV:  + chest pain, No-orthopnea, PND, swelling in lower extremities, anasarca, dizziness, palpitations Resp: + shortness of breath with exertion or at rest.              productive cough,   non-productive cough,  No- coughing up of blood.            change in color of mucus.  No- wheezing.   Skin: No-   rash or lesions. GI:    heartburn, indigestion, No-abdominal pain, nausea, vomiting,  GU:  MS:  + joint pain or swelling.  . Neuro-     nothing unusual Psych: change in mood or affect. +Chronic depression , + anxiety.  No memory loss.   Objective:   Physical Exam General- Alert, Oriented , Distress- none acute, + Obese Skin- rash-none, lesions- none, excoriation- none Lymphadenopathy- none Head- + healed scar forehead  Eyes- Gross vision intact, PERRLA,  conjunctivae clear secretions            Ears- Hearing, canals-normal            Nose- No- rhinorrhea, no-Septal dev,, polyps, erosion, perforation             Throat- Mallampati II , mucosa clear , drainage- none, tonsils- atrophic. +Missing teeth Neck- flexible , trachea midline, no stridor , thyroid nl, carotid no bruit Chest - symmetrical excursion , unlabored           Heart/CV- RRR , no murmur , no gallop, no rub, nl s1 s2                           - JVD- none , edema- none, stasis changes- none, varices- none           Lung- wheeze-none, unlabored, cough-none, dullness-none, rub- none,            Chest wall-  +R VATS scars s/p thymectomy. No rub. Abd-  Br/ Gen/ Rectal- Not done, not indicated Extrem-  Neuro- grossly intact to observation

## 2020-08-12 ENCOUNTER — Ambulatory Visit (INDEPENDENT_AMBULATORY_CARE_PROVIDER_SITE_OTHER): Payer: Medicare Other | Admitting: Internal Medicine

## 2020-08-12 ENCOUNTER — Encounter: Payer: Self-pay | Admitting: Internal Medicine

## 2020-08-12 ENCOUNTER — Other Ambulatory Visit: Payer: Self-pay

## 2020-08-12 DIAGNOSIS — J449 Chronic obstructive pulmonary disease, unspecified: Secondary | ICD-10-CM | POA: Diagnosis not present

## 2020-08-12 DIAGNOSIS — E785 Hyperlipidemia, unspecified: Secondary | ICD-10-CM

## 2020-08-12 MED ORDER — AZITHROMYCIN 250 MG PO TABS: ORAL_TABLET | ORAL | 0 refills | Status: DC

## 2020-08-12 NOTE — Assessment & Plan Note (Signed)
Variable tightness, now with mild exacerbation reporting green sputum.  Plan- Zpak

## 2020-08-12 NOTE — Patient Instructions (Addendum)
Please do try the crestor medicine. If you have problems with it,  Let your cardiologist know.  Script sent for Zpak for bronchitis  Please call if we canhelp

## 2020-08-12 NOTE — Assessment & Plan Note (Signed)
She should follow cardiology recs. I advised her to go ahead and try Crestor as prescribed and f/u any issues with cardiology.

## 2020-08-17 ENCOUNTER — Ambulatory Visit
Admission: RE | Admit: 2020-08-17 | Discharge: 2020-08-17 | Disposition: A | Discharge status: Home / Self Care | Payer: Medicare Other | Source: Ambulatory Visit | Attending: Internal Medicine | Admitting: Internal Medicine

## 2020-08-17 DIAGNOSIS — E041 Nontoxic single thyroid nodule: Secondary | ICD-10-CM

## 2020-08-20 DIAGNOSIS — J4520 Mild intermittent asthma, uncomplicated: Secondary | ICD-10-CM | POA: Diagnosis not present

## 2020-08-20 DIAGNOSIS — J449 Chronic obstructive pulmonary disease, unspecified: Secondary | ICD-10-CM | POA: Diagnosis not present

## 2020-08-31 ENCOUNTER — Telehealth: Payer: Self-pay | Admitting: Internal Medicine

## 2020-08-31 NOTE — Telephone Encounter (Signed)
° °  Patient requesting refill for   HYDROcodone-acetaminophen (NORCO) 10-325 MG tablet  [id number] [place], [place] - [place].

## 2020-09-02 ENCOUNTER — Other Ambulatory Visit: Payer: Self-pay | Admitting: Internal Medicine

## 2020-09-02 ENCOUNTER — Telehealth: Payer: Self-pay | Admitting: Internal Medicine

## 2020-09-02 DIAGNOSIS — K219 Gastro-esophageal reflux disease without esophagitis: Secondary | ICD-10-CM

## 2020-09-02 MED ORDER — ALPRAZOLAM 1 MG PO TABS: 1.0000 mg | ORAL_TABLET | Freq: Four times a day (QID) | ORAL | 3 refills | Status: DC | PRN

## 2020-09-02 NOTE — Telephone Encounter (Signed)
Received faxed refill request from   Pt called for refill  Medication name/strength/dose: alprazolam 1 mg Medication last rx'd: 05/18/2020 Quantity and number of refills last rx'd: #120 with 3 refills Instructions: take one tablet by mouth every 6 hours as needed  Last OV: 08/12/2020 Next OV: 02/11/2021  CY  please advise on refill request  Allergies  Allergen Reactions  . No Known Allergies    Current Outpatient Medications on File Prior to Visit  Medication Sig Dispense Refill  . albuterol (PROVENTIL) (2.5 MG/3ML) 0.083% nebulizer solution USE 1 VIAL IN NEBULIZER EVERY 6 HOURS - And As Needed 75 mL 11  . albuterol (VENTOLIN HFA) 108 (90 Base) MCG/ACT inhaler INHALE 1 TO 2 PUFFS INTO THE LUNGS EVERY 4 HOURS AS NEEDED FOR WHEEZING OR SHORTNESS OF BREATH 8.5 each 5  . ALPRAZolam (XANAX) 1 MG tablet TAKE 1 TABLET BY MOUTH EVERY 6 HOURS AS NEEDED. 120 tablet 3  . azithromycin (ZITHROMAX) 250 MG tablet 2 today then one daily 6 tablet 0  . Cyanocobalamin (VITAMIN B 12) 500 MCG TABS Take 1,000 mcg by mouth daily. 60 tablet 5  . esomeprazole (NEXIUM) 40 MG capsule TAKE 1 CAPSULE BY MOUTH EVERY DAY 90 capsule 1  . folic acid (FOLVITE) 1 MG tablet Take 1 mg by mouth daily.    Marland Kitchen HYDROcodone-acetaminophen (NORCO) 10-325 MG tablet Take 1 tablet by mouth every 4 (four) hours as needed (for pain). 90 tablet 0  . ibuprofen (ADVIL) 800 MG tablet TAKE 1 TABLET BY MOUTH EVERY 8 HOURS AS NEEDED 180 tablet 1  . NARCAN 4 MG/0.1ML LIQD nasal spray kit SMARTSIG:Both Nares    . rosuvastatin (CRESTOR) 20 MG tablet Take 1 tablet (20 mg total) by mouth daily. (Patient not taking: Reported on 08/12/2020) 90 tablet 3  . [DISCONTINUED] progesterone (PROMETRIUM) 100 MG capsule Take 100 mg by mouth daily.     No current facility-administered medications on file prior to visit.

## 2020-09-02 NOTE — Telephone Encounter (Signed)
Alprazolam refilled.  

## 2020-09-03 ENCOUNTER — Other Ambulatory Visit: Payer: Self-pay | Admitting: Internal Medicine

## 2020-09-03 DIAGNOSIS — M159 Polyosteoarthritis, unspecified: Secondary | ICD-10-CM

## 2020-09-03 DIAGNOSIS — M8949 Other hypertrophic osteoarthropathy, multiple sites: Secondary | ICD-10-CM

## 2020-09-03 DIAGNOSIS — M5030 Other cervical disc degeneration, unspecified cervical region: Secondary | ICD-10-CM

## 2020-09-03 MED ORDER — HYDROCODONE-ACETAMINOPHEN 10-325 MG PO TABS: 1.0000 | ORAL_TABLET | ORAL | 0 refills | Status: DC | PRN

## 2020-09-03 NOTE — Telephone Encounter (Signed)
Patient calling back because medication is out today

## 2020-09-03 NOTE — Telephone Encounter (Signed)
Call made to patient, confirmed DOB. Made are script has been sent in. Voiced understanding. Nothing further needed at this time.

## 2020-09-14 DIAGNOSIS — J449 Chronic obstructive pulmonary disease, unspecified: Secondary | ICD-10-CM | POA: Diagnosis not present

## 2020-09-20 DIAGNOSIS — J4520 Mild intermittent asthma, uncomplicated: Secondary | ICD-10-CM | POA: Diagnosis not present

## 2020-09-28 ENCOUNTER — Telehealth: Payer: Self-pay | Admitting: Internal Medicine

## 2020-09-28 NOTE — Telephone Encounter (Signed)
Patient requesting refill for HYDROcodone-acetaminophen (NORCO) 10-325 MG tablet  Pharmacy [id number] - [place], [place].

## 2020-09-29 ENCOUNTER — Other Ambulatory Visit: Payer: Self-pay | Admitting: Internal Medicine

## 2020-09-29 DIAGNOSIS — M5030 Other cervical disc degeneration, unspecified cervical region: Secondary | ICD-10-CM

## 2020-09-29 DIAGNOSIS — M159 Polyosteoarthritis, unspecified: Secondary | ICD-10-CM

## 2020-09-29 DIAGNOSIS — M8949 Other hypertrophic osteoarthropathy, multiple sites: Secondary | ICD-10-CM

## 2020-09-29 MED ORDER — HYDROCODONE-ACETAMINOPHEN 10-325 MG PO TABS: 1.0000 | ORAL_TABLET | ORAL | 0 refills | Status: DC | PRN

## 2020-10-07 DIAGNOSIS — J449 Chronic obstructive pulmonary disease, unspecified: Secondary | ICD-10-CM | POA: Diagnosis not present

## 2020-10-18 DIAGNOSIS — J4520 Mild intermittent asthma, uncomplicated: Secondary | ICD-10-CM | POA: Diagnosis not present

## 2020-10-20 ENCOUNTER — Encounter: Payer: Self-pay | Admitting: *Deleted

## 2020-10-28 ENCOUNTER — Encounter: Payer: Self-pay | Admitting: Internal Medicine

## 2020-10-28 ENCOUNTER — Other Ambulatory Visit: Payer: Self-pay

## 2020-10-28 ENCOUNTER — Ambulatory Visit (INDEPENDENT_AMBULATORY_CARE_PROVIDER_SITE_OTHER): Payer: Medicare Other | Admitting: Internal Medicine

## 2020-10-28 VITALS — BP 128/84 | HR 77 | Temp 98.2°F | Resp 16 | Ht 66.5 in | Wt 196.0 lb

## 2020-10-28 DIAGNOSIS — M5030 Other cervical disc degeneration, unspecified cervical region: Secondary | ICD-10-CM

## 2020-10-28 DIAGNOSIS — I10 Essential (primary) hypertension: Secondary | ICD-10-CM | POA: Diagnosis not present

## 2020-10-28 DIAGNOSIS — M8949 Other hypertrophic osteoarthropathy, multiple sites: Secondary | ICD-10-CM

## 2020-10-28 DIAGNOSIS — E118 Type 2 diabetes mellitus with unspecified complications: Secondary | ICD-10-CM | POA: Diagnosis not present

## 2020-10-28 DIAGNOSIS — I700 Atherosclerosis of aorta: Secondary | ICD-10-CM | POA: Diagnosis not present

## 2020-10-28 DIAGNOSIS — M159 Polyosteoarthritis, unspecified: Secondary | ICD-10-CM

## 2020-10-28 DIAGNOSIS — E041 Nontoxic single thyroid nodule: Secondary | ICD-10-CM

## 2020-10-28 DIAGNOSIS — E785 Hyperlipidemia, unspecified: Secondary | ICD-10-CM

## 2020-10-28 LAB — URINALYSIS, ROUTINE W REFLEX MICROSCOPIC
Bilirubin Urine: NEGATIVE
Hgb urine dipstick: NEGATIVE
Ketones, ur: NEGATIVE
Leukocytes,Ua: NEGATIVE
Nitrite: NEGATIVE
RBC / HPF: NONE SEEN (ref 0–?)
Specific Gravity, Urine: <=1.005 — AB (ref 1.000–1.030)
Total Protein, Urine: NEGATIVE
Urine Glucose: NEGATIVE
Urobilinogen, UA: 0.2 (ref 0.0–1.0)
pH: 6 (ref 5.0–8.0)

## 2020-10-28 LAB — LIPID PANEL
Cholesterol: 200 mg/dL (ref 0–200)
HDL: 45.1 mg/dL (ref >39.00)
LDL Cholesterol: 125 mg/dL — ABNORMAL HIGH (ref 0–99)
NonHDL: 154.75
Total CHOL/HDL Ratio: 4
Triglycerides: 151 mg/dL — ABNORMAL HIGH (ref 0.0–149.0)
VLDL: 30.2 mg/dL (ref 0.0–40.0)

## 2020-10-28 LAB — BASIC METABOLIC PANEL
BUN: 13 mg/dL (ref 6–23)
CO2: 29 mEq/L (ref 19–32)
Calcium: 9.2 mg/dL (ref 8.4–10.5)
Chloride: 102 mEq/L (ref 96–112)
Creatinine, Ser: 0.71 mg/dL (ref 0.40–1.20)
GFR: 90.1 mL/min (ref >60.00)
Glucose, Bld: 106 mg/dL — ABNORMAL HIGH (ref 70–99)
Potassium: 4.5 mEq/L (ref 3.5–5.1)
Sodium: 138 mEq/L (ref 135–145)

## 2020-10-28 LAB — MICROALBUMIN / CREATININE URINE RATIO
Creatinine,U: 17.5 mg/dL
Microalb Creat Ratio: 4 mg/g (ref 0.0–30.0)
Microalb, Ur: <0.7 mg/dL (ref 0.0–1.9)

## 2020-10-28 LAB — TSH: TSH: 1.98 u[IU]/mL (ref 0.35–4.50)

## 2020-10-28 LAB — HEMOGLOBIN A1C: Hgb A1c MFr Bld: 6.1 % (ref 4.6–6.5)

## 2020-10-28 MED ORDER — HYDROCODONE-ACETAMINOPHEN 10-325 MG PO TABS: 1.0000 | ORAL_TABLET | ORAL | 0 refills | Status: DC | PRN

## 2020-10-28 NOTE — Patient Instructions (Signed)
High Cholesterol  High cholesterol is a condition in which the blood has high levels of a white, waxy substance similar to fat (cholesterol). The liver makes all the cholesterol that the body needs. The human body needs small amounts of cholesterol to help build cells. A person gets extra or excess cholesterol from the food that he or she eats. The blood carries cholesterol from the liver to the rest of the body. If you have high cholesterol, deposits (plaques) may build up on the walls of your arteries. Arteries are the blood vessels that carry blood away from your heart. These plaques make the arteries narrow and stiff. Cholesterol plaques increase your risk for heart attack and stroke. Work with your health care provider to keep your cholesterol levels in a healthy range. What increases the risk? The following factors may make you more likely to develop this condition:  Eating foods that are high in animal fat (saturated fat) or cholesterol.  Being overweight.  Not getting enough exercise.  A family history of high cholesterol (familial hypercholesterolemia).  Use of tobacco products.  Having diabetes. What are the signs or symptoms? There are no symptoms of this condition. How is this diagnosed? This condition may be diagnosed based on the results of a blood test.  If you are older than 64 years of age, your health care provider may check your cholesterol levels every 4-6 years.  You may be checked more often if you have high cholesterol or other risk factors for heart disease. The blood test for cholesterol measures:  "Bad" cholesterol, or LDL cholesterol. This is the main type of cholesterol that causes heart disease. The desired level is less than 100 mg/dL.  "Good" cholesterol, or HDL cholesterol. HDL helps protect against heart disease by cleaning the arteries and carrying the LDL to the liver for processing. The desired level for HDL is 60 mg/dL or higher.  Triglycerides.  These are fats that your body can store or burn for energy. The desired level is less than 150 mg/dL.  Total cholesterol. This measures the total amount of cholesterol in your blood and includes LDL, HDL, and triglycerides. The desired level is less than 200 mg/dL. How is this treated? This condition may be treated with:  Diet changes. You may be asked to eat foods that have more fiber and less saturated fats or added sugar.  Lifestyle changes. These may include regular exercise, maintaining a healthy weight, and quitting use of tobacco products.  Medicines. These are given when diet and lifestyle changes have not worked. You may be prescribed a statin medicine to help lower your cholesterol levels. Follow these instructions at home: Eating and drinking  Eat a healthy, balanced diet. This diet includes: ? Daily servings of a variety of fresh, frozen, or canned fruits and vegetables. ? Daily servings of whole grain foods that are rich in fiber. ? Foods that are low in saturated fats and trans fats. These include poultry and fish without skin, lean cuts of meat, and low-fat dairy products. ? A variety of fish, especially oily fish that contain omega-3 fatty acids. Aim to eat fish at least 2 times a week.  Avoid foods and drinks that have added sugar.  Use healthy cooking methods, such as roasting, grilling, broiling, baking, poaching, steaming, and stir-frying. Do not fry your food except for stir-frying.   Lifestyle  Get regular exercise. Aim to exercise for a total of 150 minutes a week. Increase your activity level by doing activities   such as gardening, walking, and taking the stairs.  Do not use any products that contain nicotine or tobacco, such as cigarettes, e-cigarettes, and chewing tobacco. If you need help quitting, ask your health care provider.   General instructions  Take over-the-counter and prescription medicines only as told by your health care provider.  Keep all  follow-up visits as told by your health care provider. This is important. Where to find more information  American Heart Association: www.heart.org  National Heart, Lung, and Blood Institute: www.nhlbi.nih.gov Contact a health care provider if:  You have trouble achieving or maintaining a healthy diet or weight.  You are starting an exercise program.  You are unable to stop smoking. Get help right away if:  You have chest pain.  You have trouble breathing.  You have any symptoms of a stroke. "BE FAST" is an easy way to remember the main warning signs of a stroke: ? B - Balance. Signs are dizziness, sudden trouble walking, or loss of balance. ? E - Eyes. Signs are trouble seeing or a sudden change in vision. ? F - Face. Signs are sudden weakness or numbness of the face, or the face or eyelid drooping on one side. ? A - Arms. Signs are weakness or numbness in an arm. This happens suddenly and usually on one side of the body. ? S - Speech. Signs are sudden trouble speaking, slurred speech, or trouble understanding what people say. ? T - Time. Time to call emergency services. Write down what time symptoms started.  You have other signs of a stroke, such as: ? A sudden, severe headache with no known cause. ? Nausea or vomiting. ? Seizure. These symptoms may represent a serious problem that is an emergency. Do not wait to see if the symptoms will go away. Get medical help right away. Call your local emergency services (911 in the [place] Do not drive yourself to the hospital. Summary  Cholesterol plaques increase your risk for heart attack and stroke. Work with your health care provider to keep your cholesterol levels in a healthy range.  Eat a healthy, balanced diet, get regular exercise, and maintain a healthy weight.  Do not use any products that contain nicotine or tobacco, such as cigarettes, e-cigarettes, and chewing tobacco.  Get help right away if you have any symptoms of a  stroke. This information is not intended to replace advice given to you by your health care provider. Make sure you discuss any questions you have with your health care provider. Document Revised: 06/10/2019 Document Reviewed: 06/10/2019 Elsevier Patient Education  2021 Elsevier Inc.  

## 2020-10-28 NOTE — Progress Notes (Signed)
Subjective:  Patient ID: Felicia Odom, female    DOB: [birth year]  Age: 65 y.o. MRN: [id number]  CC: Hypertension, Diabetes, and Hyperlipidemia  This visit occurred during the SARS-CoV-2 public health emergency.  Safety protocols were in place, including screening questions prior to the visit, additional usage of staff PPE, and extensive cleaning of exam room while observing appropriate contact time as indicated for disinfecting solutions.    HPI Felicia Odom presents for f/up -   She has lost weight recently with lifestyle modifications.  She works out on a treadmill and does not experience CP, DOE, palpitations, edema, or fatigue.  She continues to complain of chronic, unchanged musculoskeletal pain and requests a refill on her pain meds.  She refuses vaccines against influenza and pneumonia today.  Outpatient Medications Prior to Visit  Medication Sig Dispense Refill  . albuterol (PROVENTIL) (2.5 MG/3ML) 0.083% nebulizer solution USE 1 VIAL IN NEBULIZER EVERY 6 HOURS - And As Needed 75 mL 11  . albuterol (VENTOLIN HFA) 108 (90 Base) MCG/ACT inhaler INHALE 1 TO 2 PUFFS INTO THE LUNGS EVERY 4 HOURS AS NEEDED FOR WHEEZING OR SHORTNESS OF BREATH 8.5 each 5  . ALPRAZolam (XANAX) 1 MG tablet Take 1 tablet (1 mg total) by mouth every 6 (six) hours as needed. 120 tablet 3  . Cyanocobalamin (VITAMIN B 12) 500 MCG TABS Take 1,000 mcg by mouth daily. 60 tablet 5  . esomeprazole (NEXIUM) 40 MG capsule TAKE 1 CAPSULE BY MOUTH EVERY DAY 90 capsule 1  . folic acid (FOLVITE) 1 MG tablet Take 1 mg by mouth daily.    Marland Kitchen ibuprofen (ADVIL) 800 MG tablet TAKE 1 TABLET BY MOUTH EVERY 8 HOURS AS NEEDED 180 tablet 1  . NARCAN 4 MG/0.1ML LIQD nasal spray kit SMARTSIG:Both Nares    . HYDROcodone-acetaminophen (NORCO) 10-325 MG tablet Take 1 tablet by mouth every 4 (four) hours as needed (for pain). 90 tablet 0  . rosuvastatin (CRESTOR) 20 MG tablet Take 1 tablet (20 mg total) by mouth daily.  (Patient not taking: Reported on 08/12/2020) 90 tablet 3   No facility-administered medications prior to visit.    ROS Review of Systems  Constitutional: Negative for chills, diaphoresis, fatigue and fever.  HENT: Negative.   Eyes: Negative.   Respiratory: Negative for cough, chest tightness, shortness of breath and wheezing.   Cardiovascular: Negative for chest pain, palpitations and leg swelling.  Gastrointestinal: Negative for abdominal pain, constipation, diarrhea, nausea and vomiting.  Genitourinary: Negative.  Negative for difficulty urinating.  Musculoskeletal: Positive for arthralgias and neck pain. Negative for back pain, joint swelling and myalgias.  Skin: Negative.   Neurological: Negative.  Negative for dizziness, weakness, light-headedness and numbness.  Hematological: Negative for adenopathy. Does not bruise/bleed easily.  Psychiatric/Behavioral: Negative for decreased concentration, dysphoric mood, sleep disturbance and suicidal ideas. The patient is nervous/anxious.     Objective:  BP 128/84   Pulse 77   Temp 98.2 F (36.8 C) (Oral)   Resp 16   Ht 5' 6.5" (1.689 m)   Wt 196 lb (88.9 kg)   SpO2 98%   BMI 31.16 kg/m   BP Readings from Last 3 Encounters:  10/28/20 128/84  08/12/20 122/80  04/29/20 136/80    Wt Readings from Last 3 Encounters:  10/28/20 196 lb (88.9 kg)  08/12/20 202 lb (91.6 kg)  06/26/20 200 lb (90.7 kg)    Physical Exam Vitals reviewed.  HENT:     Nose: Nose normal.  Mouth/Throat:     Mouth: Mucous membranes are moist.  Eyes:     General: No scleral icterus.    Conjunctiva/sclera: Conjunctivae normal.  Cardiovascular:     Rate and Rhythm: Normal rate and regular rhythm.     Heart sounds: No murmur heard.   Pulmonary:     Effort: Pulmonary effort is normal.     Breath sounds: No stridor. No wheezing, rhonchi or rales.  Abdominal:     General: Abdomen is flat.     Palpations: There is no mass.     Tenderness: There is no  abdominal tenderness. There is no guarding.  Musculoskeletal:        General: Normal range of motion.     Cervical back: Neck supple.     Right lower leg: No edema.     Left lower leg: No edema.  Lymphadenopathy:     Cervical: No cervical adenopathy.  Skin:    General: Skin is warm and dry.  Neurological:     General: No focal deficit present.     Mental Status: She is alert.  Psychiatric:        Mood and Affect: Mood normal.        Behavior: Behavior normal.     Lab Results  Component Value Date   WBC 5.3 01/28/2020   HGB 14.8 01/28/2020   HCT 43.1 01/28/2020   PLT 235.0 01/28/2020   GLUCOSE 106 (H) 10/28/2020   CHOL 200 10/28/2020   TRIG 151.0 (H) 10/28/2020   HDL 45.10 10/28/2020   LDLDIRECT 142.0 01/21/2015   LDLCALC 125 (H) 10/28/2020   ALT 23 08/08/2019   AST 20 08/08/2019   NA 138 10/28/2020   K 4.5 10/28/2020   CL 102 10/28/2020   CREATININE 0.71 10/28/2020   BUN 13 10/28/2020   CO2 29 10/28/2020   TSH 1.98 10/28/2020   INR 0.89 06/08/2016   HGBA1C 6.1 10/28/2020   MICROALBUR <0.7 10/28/2020    US THYROID  Result Date: 08/17/2020 CLINICAL DATA:  Thyroid nodule follow-up EXAM: THYROID ULTRASOUND TECHNIQUE: Ultrasound examination of the thyroid gland and adjacent soft tissues was performed. COMPARISON:  11/02/2015 FINDINGS: Parenchymal Echotexture: Mildly heterogeneous Isthmus: 0.6 cm Right lobe: 5.1 x 2.2 x 1.9 cm Left lobe: 5.1 x 1.5 x 1.8 cm _________________________________________________________ Estimated total number of nodules >/= 1 cm: 1 Number of spongiform nodules >/=  2 cm not described below (TR1): 0 Number of mixed cystic and solid nodules >/= 1.5 cm not described below ([place]): 0 _________________________________________________________ Nodule # 1: 3.0 x 2.1 x 2.0 cm isoechoic solid nodule in the mid right thyroid lobe was previously evaluated with FNA. Please correlate with results. _________________________________________________________ Nodule #  2: 0.8 x 0.7 x 0.7 cm isoechoic solid nodule in the inferior left thyroid lobe is not significant changed in size on the prior examination and does not meet criteria for imaging follow-up or FNA. _________________________________________________________ IMPRESSION: No significant interval change of bilateral thyroid nodules. The above is in keeping with the ACR TI-RADS recommendations - J Am Coll Radiol 2017;14:587-595. Electronically Signed   By: Miachel Roux M.D.   On: 08/17/2020 14:18    Assessment & Plan:   Felicia Odom was seen today for hypertension, diabetes and hyperlipidemia.  Diagnoses and all orders for this visit:  Essential hypertension- Her blood pressure is adequately well controlled. -     Basic metabolic panel; Future -     Urinalysis, Routine w reflex microscopic; Future -  TSH; Future -     TSH -     Urinalysis, Routine w reflex microscopic -     Basic metabolic panel  Type II diabetes mellitus with manifestations ([place])- Her blood sugar is adequately well controlled. -     Basic metabolic panel; Future -     Hemoglobin A1c; Future -     Microalbumin / creatinine urine ratio; Future -     Microalbumin / creatinine urine ratio -     Hemoglobin A1c -     Basic metabolic panel  Hyperlipidemia with target LDL less than 100- She has not achieved her LDL goal.  I have asked her to be more compliant with the statin. -     Lipid panel; Future -     TSH; Future -     TSH -     Lipid panel -     rosuvastatin (CRESTOR) 20 MG tablet; Take 1 tablet (20 mg total) by mouth daily.  Atherosclerosis of aorta ([place])- Risk factor modifications addressed.  Thyroid nodule- She is euthyroid. -     TSH; Future -     TSH  DDD (degenerative disc disease), cervical -     HYDROcodone-acetaminophen (NORCO) 10-325 MG tablet; Take 1 tablet by mouth every 4 (four) hours as needed (for pain).  Primary osteoarthritis involving multiple joints -     HYDROcodone-acetaminophen (NORCO) 10-325 MG  tablet; Take 1 tablet by mouth every 4 (four) hours as needed (for pain).   I am having Felicia Odom. [place] maintain her folic acid, Vitamin B 12, albuterol, albuterol, ibuprofen, Narcan, esomeprazole, ALPRAZolam, HYDROcodone-acetaminophen, and rosuvastatin.  Meds ordered this encounter  Medications  . HYDROcodone-acetaminophen (NORCO) 10-325 MG tablet    Sig: Take 1 tablet by mouth every 4 (four) hours as needed (for pain).    Dispense:  90 tablet    Refill:  0  . rosuvastatin (CRESTOR) 20 MG tablet    Sig: Take 1 tablet (20 mg total) by mouth daily.    Dispense:  90 tablet    Refill:  1     Follow-up: Return in about 6 months (around 04/29/2021).  Scarlette Calico, MD

## 2020-10-29 ENCOUNTER — Encounter: Payer: Self-pay | Admitting: *Deleted

## 2020-10-29 MED ORDER — ROSUVASTATIN CALCIUM 20 MG PO TABS: 20.0000 mg | ORAL_TABLET | Freq: Every day | ORAL | 1 refills | Status: DC

## 2020-11-02 ENCOUNTER — Other Ambulatory Visit: Payer: Self-pay

## 2020-11-18 DIAGNOSIS — J4520 Mild intermittent asthma, uncomplicated: Secondary | ICD-10-CM | POA: Diagnosis not present

## 2020-11-18 DIAGNOSIS — J449 Chronic obstructive pulmonary disease, unspecified: Secondary | ICD-10-CM | POA: Diagnosis not present

## 2020-11-23 ENCOUNTER — Other Ambulatory Visit: Payer: Self-pay | Admitting: Internal Medicine

## 2020-11-23 ENCOUNTER — Telehealth: Payer: Self-pay | Admitting: Internal Medicine

## 2020-11-23 DIAGNOSIS — M159 Polyosteoarthritis, unspecified: Secondary | ICD-10-CM

## 2020-11-23 DIAGNOSIS — M8949 Other hypertrophic osteoarthropathy, multiple sites: Secondary | ICD-10-CM

## 2020-11-23 DIAGNOSIS — M5030 Other cervical disc degeneration, unspecified cervical region: Secondary | ICD-10-CM

## 2020-11-24 ENCOUNTER — Other Ambulatory Visit: Payer: Self-pay | Admitting: Internal Medicine

## 2020-11-24 DIAGNOSIS — M159 Polyosteoarthritis, unspecified: Secondary | ICD-10-CM

## 2020-11-24 DIAGNOSIS — M8949 Other hypertrophic osteoarthropathy, multiple sites: Secondary | ICD-10-CM

## 2020-11-24 DIAGNOSIS — M5030 Other cervical disc degeneration, unspecified cervical region: Secondary | ICD-10-CM

## 2020-11-24 MED ORDER — HYDROCODONE-ACETAMINOPHEN 10-325 MG PO TABS: 1.0000 | ORAL_TABLET | ORAL | 0 refills | Status: DC | PRN

## 2020-12-01 DIAGNOSIS — B078 Other viral warts: Secondary | ICD-10-CM | POA: Diagnosis not present

## 2020-12-08 ENCOUNTER — Telehealth: Payer: Self-pay | Admitting: Internal Medicine

## 2020-12-08 NOTE — Chronic Care Management (AMB) (Signed)
  Chronic Care Management   Note  12/08/2020 Name: [name] MRN: [id number] DOB: March 16, 1956  [name] is a 65 y.o. year old female who is a primary care patient of [name], MD. I reached out to Lesly Rubenstein by phone today in response to a referral sent by Ms. [place] B[place] C[place]ars's PCP, [name], MD.   [name] was given information about Chronic Care Management services today including:  1. CCM service includes personalized support from designated clinical staff supervised by her physician, including individualized plan of care and coordination with other care providers 2. 24/7 contact phone numbers for assistance for urgent and routine care needs. 3. Service will only be billed when office clinical staff spend 20 minutes or more in a month to coordinate care. 4. Only one practitioner may furnish and bill the service in a calendar month. 5. The patient may stop CCM services at any time (effective at the end of the month) by phone call to the office staff.   Patient agreed to services and verbal consent obtained.   Follow up plan:   Lauretta Grill Upstream Scheduler

## 2020-12-14 DIAGNOSIS — J449 Chronic obstructive pulmonary disease, unspecified: Secondary | ICD-10-CM | POA: Diagnosis not present

## 2020-12-18 DIAGNOSIS — J4520 Mild intermittent asthma, uncomplicated: Secondary | ICD-10-CM | POA: Diagnosis not present

## 2020-12-22 ENCOUNTER — Telehealth: Payer: Self-pay | Admitting: Internal Medicine

## 2020-12-22 NOTE — Telephone Encounter (Signed)
Patient calling to request refill for HYDROcodone-acetaminophen (NORCO) 10-325 MG tablet  Pharmacy [id number] [place], [place].

## 2020-12-23 ENCOUNTER — Other Ambulatory Visit: Payer: Self-pay | Admitting: Internal Medicine

## 2020-12-23 DIAGNOSIS — M5030 Other cervical disc degeneration, unspecified cervical region: Secondary | ICD-10-CM

## 2020-12-23 DIAGNOSIS — M159 Polyosteoarthritis, unspecified: Secondary | ICD-10-CM

## 2020-12-23 DIAGNOSIS — M8949 Other hypertrophic osteoarthropathy, multiple sites: Secondary | ICD-10-CM

## 2020-12-23 MED ORDER — HYDROCODONE-ACETAMINOPHEN 10-325 MG PO TABS: 1.0000 | ORAL_TABLET | ORAL | 0 refills | Status: DC | PRN

## 2020-12-24 ENCOUNTER — Other Ambulatory Visit: Payer: Self-pay | Admitting: Internal Medicine

## 2020-12-24 ENCOUNTER — Telehealth: Payer: Self-pay | Admitting: Internal Medicine

## 2020-12-24 MED ORDER — ALPRAZOLAM 1 MG PO TABS
1.0000 mg | ORAL_TABLET | Freq: Four times a day (QID) | ORAL | 3 refills | Status: DC | PRN
Start: 2020-12-24 — End: 2021-04-22

## 2020-12-24 NOTE — Telephone Encounter (Signed)
Called and spoke with Patient.  [facility] stated [facility] would need a new alprazolam prescription sent in.  Patient stated her next OV is scheduled 02/11/21 for follow up with Dr. Annamaria Boots. Patient stated [facility] is [facility] [address]  Alprazolam 65m, every 6 hours as needed, #120, with 3 refills-last prescription sent 09/02/20.   Message routed to Dr. YAnnamaria Boots  Allergies  Allergen Reactions  . No Known Allergies    Current Outpatient Medications on File Prior to Visit  Medication Sig Dispense Refill  . albuterol (PROVENTIL) (2.5 MG/3ML) 0.083% nebulizer solution USE 1 VIAL IN NEBULIZER EVERY 6 HOURS - And As Needed 75 mL 11  . albuterol (VENTOLIN HFA) 108 (90 Base) MCG/ACT inhaler INHALE 1 TO 2 PUFFS INTO THE LUNGS EVERY 4 HOURS AS NEEDED FOR WHEEZING OR SHORTNESS OF BREATH 8.5 each 5  . ALPRAZolam (XANAX) 1 MG tablet Take 1 tablet (1 mg total) by mouth every 6 (six) hours as needed. 120 tablet 3  . Cyanocobalamin (VITAMIN B 12) 500 MCG TABS Take 1,000 mcg by mouth daily. 60 tablet 5  . esomeprazole (NEXIUM) 40 MG capsule TAKE 1 CAPSULE BY MOUTH EVERY DAY 90 capsule 1  . folic acid (FOLVITE) 1 MG tablet Take 1 mg by mouth daily.    .Marland KitchenHYDROcodone-acetaminophen (NORCO) 10-325 MG tablet Take 1 tablet by mouth every 4 (four) hours as needed (for pain). 90 tablet 0  . ibuprofen (ADVIL) 800 MG tablet TAKE 1 TABLET BY MOUTH EVERY 8 HOURS AS NEEDED 180 tablet 1  . NARCAN 4 MG/0.1ML LIQD nasal spray kit SMARTSIG:Both Nares    . rosuvastatin (CRESTOR) 20 MG tablet Take 1 tablet (20 mg total) by mouth daily. 90 tablet 1  . [DISCONTINUED] progesterone (PROMETRIUM) 100 MG capsule Take 100 mg by mouth daily.     No current facility-administered medications on file prior to visit.

## 2020-12-24 NOTE — Telephone Encounter (Signed)
Spoke with pt and advised that Alprazolam refill has been done. Pt verbalized understanding. Nothing further needed.

## 2020-12-24 NOTE — Telephone Encounter (Signed)
Alprazolam refilled Keep scheduled appointment

## 2020-12-28 NOTE — Progress Notes (Signed)
Chronic Care Management Pharmacy Note  12/29/2020 Name:  Felicia Odom MRN:  [id number] DOB:  [birth year]  Subjective: Felicia Odom is an 65 y.o. year old female who is a primary patient of [name], MD.  The CCM team was consulted for assistance with disease management and care coordination needs.    Engaged with patient face to face for initial visit in response to provider referral for pharmacy case management and/or care coordination services.   Consent to Services:  The patient was given the following information about Chronic Care Management services today, agreed to services, and gave verbal consent: 1. CCM service includes personalized support from designated clinical staff supervised by the primary care provider, including individualized plan of care and coordination with other care providers 2. 24/7 contact phone numbers for assistance for urgent and routine care needs. 3. Service will only be billed when office clinical staff spend 20 minutes or more in a month to coordinate care. 4. Only one practitioner may furnish and bill the service in a calendar month. 5.The patient may stop CCM services at any time (effective at the end of the month) by phone call to the office staff. 6. The patient will be responsible for cost sharing (co-pay) of up to 20% of the service fee (after annual deductible is met). Patient agreed to services and consent obtained.  Patient Care Team: [name], MD as PCP - General (Internal Medicine) Tomasa Blase, [facility] as Pharmacist (Pharmacist)  Recent office visits: 10/28/2020 - PCP visit - no changes to current medications  04/29/2020 - PCP visit - COPD and HLD follow up - patient unwilling to use statin or use any inhaler other than albuterol inhaler   Recent consult visits: 08/12/2020 - Dr. Annamaria Boots - Pulmonology  - given z-pak for bronchitis   Hospital visits: None in previous 6 months  Objective:  Lab Results  Component Value  Date   CREATININE 0.71 10/28/2020   BUN 13 10/28/2020   GFR 90.10 10/28/2020   GFRNONAA >60 06/12/2016   GFRAA >60 06/12/2016   NA 138 10/28/2020   K 4.5 10/28/2020   CALCIUM 9.2 10/28/2020   CO2 29 10/28/2020   GLUCOSE 106 (H) 10/28/2020    Lab Results  Component Value Date/Time   HGBA1C 6.1 10/28/2020 08:27 AM   HGBA1C 6.2 01/28/2020 09:07 AM   GFR 90.10 10/28/2020 08:27 AM   GFR 84.44 01/28/2020 09:07 AM   MICROALBUR <0.7 10/28/2020 08:27 AM   MICROALBUR <0.7 08/19/2019 03:59 PM    Last diabetic Eye exam:  Lab Results  Component Value Date/Time   HMDIABEYEEXA Retinopathy (A) 01/01/2019 12:00 AM    Last diabetic Foot exam:  No results found for: HMDIABFOOTEX   Lab Results  Component Value Date   CHOL 200 10/28/2020   HDL 45.10 10/28/2020   LDLCALC 125 (H) 10/28/2020   LDLDIRECT 142.0 01/21/2015   TRIG 151.0 (H) 10/28/2020   CHOLHDL 4 10/28/2020    Hepatic Function Latest Ref Rng & Units 08/08/2019 05/20/2019 07/30/2018  Total Protein 6.0 - 8.3 g/dL 7.2 6.5 6.7  Albumin 3.5 - 5.2 g/dL 4.1 4.0 3.9  AST 0 - 37 U/L 20 19 15   ALT 0 - 35 U/L 23 22 15   Alk Phosphatase 39 - 117 U/L 71 82 62  Total Bilirubin 0.2 - 1.2 mg/dL 0.5 0.3 0.3  Bilirubin, Direct 0.00 - 0.40 mg/dL - 0.09 -    Lab Results  Component Value Date/Time   TSH  1.98 10/28/2020 08:27 AM   TSH 2.10 08/08/2019 10:30 AM    CBC Latest Ref Rng & Units 01/28/2020 08/08/2019 07/30/2018  WBC 4.0 - 10.5 K/uL 5.3 7.5 5.4  Hemoglobin 12.0 - 15.0 g/dL 14.8 14.8 14.7  Hematocrit 36.0 - 46.0 % 43.1 44.5 43.8  Platelets 150.0 - 400.0 K/uL 235.0 291.0 266.0    No results found for: VD25OH  Clinical ASCVD: No  The 10-year ASCVD risk score Mikey Bussing DC Jr., et al., 2013) is: 10.4%   Values used to calculate the score:     Age: 37 years     Sex: Female     Is Non-Hispanic African American: No     Diabetic: Yes     Tobacco smoker: No     Systolic Blood Pressure: 562 mmHg     Is BP treated: No     HDL Cholesterol:  45.1 mg/dL     Total Cholesterol: 200 mg/dL    Depression screen [facility] 2/9 10/28/2020 04/29/2020 01/28/2020  Decreased Interest 0 0 0  Down, Depressed, Hopeless 0 0 0  PHQ - 2 Score 0 0 0  Altered sleeping 0 0 -  Tired, decreased energy 0 0 -  Change in appetite 0 0 -  Feeling bad or failure about yourself  0 0 -  Trouble concentrating 0 0 -  Moving slowly or fidgety/restless 0 0 -  Suicidal thoughts 0 0 -  PHQ-9 Score 0 0 -  Some recent data might be hidden     Social History   Tobacco Use  Smoking Status Former Smoker  . Packs/day: 1.00  . Years: 36.00  . Pack years: 36.00  . Types: Cigarettes  . Quit date: 2017  . Years since quitting: 5.4  Smokeless Tobacco Never Used  Tobacco Comment   last cigarette 2017   BP Readings from Last 3 Encounters:  10/28/20 128/84  08/12/20 122/80  04/29/20 136/80   Pulse Readings from Last 3 Encounters:  10/28/20 77  08/12/20 78  04/29/20 68   Wt Readings from Last 3 Encounters:  10/28/20 196 lb (88.9 kg)  08/12/20 202 lb (91.6 kg)  06/26/20 200 lb (90.7 kg)   BMI Readings from Last 3 Encounters:  10/28/20 31.16 kg/m  08/12/20 32.12 kg/m  06/26/20 31.80 kg/m    Assessment/Interventions: Review of patient past medical history, allergies, medications, health status, including review of consultants reports, laboratory and other test data, was performed as part of comprehensive evaluation and provision of chronic care management services.   SDOH:  (Social Determinants of Health) assessments and interventions performed: Yes  SDOH Screenings   Alcohol Screen: Not on file  Depression (PHQ2-9): Low Risk   . PHQ-2 Score: 0  Financial Resource Strain: Not on file  Food Insecurity: Not on file  Housing: Not on file  Physical Activity: Not on file  Social Connections: Not on file  Stress: Not on file  Tobacco Use: Medium Risk  . Smoking Tobacco Use: Former Smoker  . Smokeless Tobacco Use: Never Used  Transportation Needs: Not  on file    CCM Care Plan  Allergies  Allergen Reactions  . No Known Allergies     Medications Reviewed Today    Reviewed by Tomasa Blase, [facility] (Pharmacist) on 12/29/20 at 1003  Med List Status: <None>  Medication Order Taking? Sig Documenting Provider Last Dose Status Informant  albuterol (PROVENTIL) (2.5 MG/3ML) 0.083% nebulizer solution [id number] Yes USE 1 VIAL IN NEBULIZER EVERY 6 HOURS - And As  Needed Deneise Lever, MD Taking Active   albuterol (VENTOLIN HFA) 108 (90 Base) MCG/ACT inhaler [id number] Yes INHALE 1 TO 2 PUFFS INTO THE LUNGS EVERY 4 HOURS AS NEEDED FOR WHEEZING OR SHORTNESS OF BREATH Young, Kasandra Knudsen, MD Taking Active   ALPRAZolam Duanne Moron) 1 MG tablet [id number] Yes Take 1 tablet (1 mg total) by mouth every 6 (six) hours as needed. Deneise Lever, MD Taking Active   [facility] Liver Oil 1000 MG CAPS [id number] Yes Take 1 capsule by mouth. [provider] Taking Active   Cyanocobalamin (VITAMIN B 12) 500 MCG TABS [id number] Yes Take 1,000 mcg by mouth daily. [name], MD Taking Active   esomeprazole (NEXIUM) 40 MG capsule [id number] Yes TAKE 1 CAPSULE BY MOUTH EVERY DAY [name], MD Taking Active   folic acid (FOLVITE) 1 MG tablet [id number] Yes Take 1 mg by mouth daily. [provider] Taking Active   HYDROcodone-acetaminophen (NORCO) 10-325 MG tablet [id number] Yes Take 1 tablet by mouth every 4 (four) hours as needed (for pain). [name], MD Taking Active   ibuprofen (ADVIL) 800 MG tablet [id number] Yes TAKE 1 TABLET BY MOUTH EVERY 8 HOURS AS NEEDED [name], MD Taking Active   magnesium oxide (MAG-OX) 400 MG tablet [id number] Yes Take 400 mg by mouth daily. [provider] Taking Active   Multiple Vitamin (MULTIVITAMIN) tablet [id number] Yes Take 1 tablet by mouth daily. [provider] Taking Active   NARCAN 4 MG/0.1ML LIQD nasal spray kit [id number] Yes SMARTSIG:Both Nares [provider] Taking Active          Discontinued 10/06/11 1336 (Discontinued by provider)   rosuvastatin (CRESTOR) 20 MG tablet [id number] No Take 1 tablet (20 mg total) by mouth daily.  Patient not taking: Reported on 12/29/2020   [name], MD Not Taking Active           Patient Active Problem List   Diagnosis Date Noted  . Thyroid nodule 07/30/2020  . Moderate episode of recurrent major depressive disorder ([place]) 01/28/2020  . Routine general medical examination at a health care facility 01/28/2020  . COPD mixed type ([place]) 02/18/2019  . Type II diabetes mellitus with manifestations ([place]) 02/18/2019  . Eczema 11/29/2017  . Encounter for long-term opiate analgesic use 07/06/2017  . Moderate tetrahydrocannabinol (THC) dependence ([place]) 07/26/2016  . Essential hypertension 09/25/2015  . DDD (degenerative disc disease), cervical 06/15/2015  . Seborrheic dermatitis of scalp 02/11/2015  . Primary osteoarthritis involving multiple joints 01/21/2015  . Hyperlipidemia with target LDL less than 100 01/21/2015  . GERD (gastroesophageal reflux disease) 05/08/2013  . Atherosclerosis of aorta ([place]) 05/08/2013  . Allergic rhinitis 10/01/2010  . PANIC DISORDER 06/08/2007  . Obsessive-compulsive disorder 06/08/2007    Immunization History  Administered Date(s) Administered  . Influenza-Unspecified 03/26/2015  . PFIZER(Purple Top)SARS-COV-2 Vaccination 09/28/2019, 10/19/2019, 05/19/2020  . Tdap 09/23/2015  . Zoster Recombinat (Shingrix) 03/27/2018, 06/08/2018    Conditions to be addressed/monitored:  Hypertension, Hyperlipidemia, Diabetes, Coronary Artery Disease, COPD and Osteoarthritis  Care Plan : CCM Care Plan  Updates made by Tomasa Blase, RPH since 12/29/2020 12:00 AM    Problem: HLD, Prediabetes, GERD, COPD, Chronic Pain, Anxiety   Priority: High  Onset Date: 12/29/2020    Long-Range Goal: Disease Management   Start Date: 12/29/2020  Expected End Date: 06/30/2021  This Visit's Progress: On track  Priority:  High  Note:   Current Barriers:  . Unable to independently monitor therapeutic efficacy .  Unable to achieve control of LDL    Pharmacist Clinical Goal(s):  Marland Kitchen Patient will achieve adherence to monitoring guidelines and medication adherence to achieve therapeutic efficacy . achieve control of LDL  as evidenced by next lipid panel  . adhere to prescribed medication regimen as evidenced by improvement in LDL - adherence to statin medication  through collaboration with PharmD and provider.   Interventions: . 1:1 collaboration with [name], MD regarding development and update of comprehensive plan of care as evidenced by provider attestation and co-signature . Inter-disciplinary care team collaboration (see longitudinal plan of care) . Comprehensive medication review performed; medication list updated in electronic medical record  Hyperlipidemia: (LDL goal < 100) -Uncontrolled  -Last LDL 125 mg/dL (10/24/2020) -Current treatment: . Rosuvastatin 15m daily - not currently taking  -Medications previously tried: atorvastatin   -Current dietary patterns: patient has been eating more salads, has stopped eating pork, recently has only been eating chicken  -Current exercise habits: walking every other day  -Educated on Cholesterol goals;  Benefits of statin for ASCVD risk reduction; Importance of limiting foods high in cholesterol; Strategies to manage statin-induced myalgias; -Recommended for patient to start use of rosuvastatin 223mdaily  Educated on benefits of statin medication, reviewed possible side effects that could occur from taking statin and way that side effects can be minimized / eliminated   Prediabetes (A1c goal <7%) -Controlled (diet) - Last A1c 6.1% (10/28/2020) -Current medications: . n/a -Medications previously tried: n/a  -Current meal patterns:  . breakfast: blueberries, boiled egg, oatmeal, cereal, yogurt, coffee, water   . lunch: salad, bowl of soup  . dinner:  spinach salad, protein . snacks: apple / banana if snacking  . drinks: water, coffee -Current exercise: walking every other day  -Educated on A1c and blood sugar goals; Complications of diabetes including kidney damage, retinal damage, and cardiovascular disease; Benefits of weight loss; -Counseled to check feet daily and get yearly eye exams -Counseled on diet and exercise extensively Recommended to continue current medication  COPD (Goal: control symptoms and prevent exacerbations) -Controlled -Current treatment  . Albuterol 0.083% nebulizer solution - 1 vial every 6 hours as needed  . Albuterol 10875m- inhale 1-2 puffs every 4 hours as needed - using 0-3 times daily  -Medications previously tried: advair   -MMRC score: 3 -Pulmonary function testing:  - FEV1/FVC = 0.77 -Exacerbations requiring treatment in last 6 months: n/a -Patient declines consistent use of maintenance inhaler, per previous PCP notes, will only use albuterol inhaler  -Frequency of rescue inhaler use: some days does not have to use at all - some days can use 3 times daily  -Counseled on treatment of exacerbations and when to reach out to clinic, feels that COPD is controlled on current regimen  -Recommended to continue current medication   Anxiety/ OCD (Goal: Prevention of anxiety attack/ mood control ) -Not ideally controlled -Current treatment: . Alprazolam 1mg39mery 6 hours as needed - effective when taken   -Medications previously tried/failed: fluvoxamine, escitalopram, clonazepam  -GAD7: 18 -Discussed referral to psych for counseling / prefers to continue current regimen at this time - Feels that her anxiety has improved since she has started reading the bible and praying every AM (started in 2019) -Educated on Benefits of medication for symptom control Benefits of cognitive-behavioral therapy with or without medication -Recommended to continue current medication Reviewed with patient to reach out  should she feel that she would like to be referred to pscyh  Chronic Pain -  DDD and OA (Goal: Pain control / prevention of flares ) -Not ideally controlled -Current treatment  . Hydrocodone/APAP 10-355m - 1 tablet every 4 hours as needed for pain - taking 3 tablets daily  . Ibuprofen 8051m- 1 tablet every 8 hours as needed  - only uses 1 tablet on occasion  . Narcan 68m868m.1mL - as needed for respiratory depression  -Medications previously tried: tramadol   -Recommended for patient to use ibuprofen up to every 8 hours as needed as she does find benefit from medication when she take, but only taking on occasion at this time   - educated patient on safety concern with use of hydrocodone and alprazolam in combination - reviewed with patient appropriate use of narcan and to ensure that her son (who lives with her) is aware of proper administration in case of emergency   GERD (Goal: Acid Control / Prevention of reflux) -Controlled -Current treatment  . Esomeprazole 8m71mily  -Medications previously tried: omeprazole   -Recommended to continue current medication   Health Maintenance -Current therapy:  . ViMarland Kitchenamin B12 - 1000 mcg daily as needed  . Folic Acid 1 mg daily as needed  . Magnesium Oxide 400mg59mly as needed  . Cod Liver Oil 1000mg 9my as needed  . Multivitamin - 1 tablet daily  - as needed  -Educated on Herbal supplement research is limited and benefits usually cannot be proven Cost vs benefit of each product must be carefully weighed by individual consumer Supplements may interfere with prescription drugs -Patient is not satisfied with therapy and will discontinue folic acid, mangesium oxide, and cod liver - plan at this time will be for patient to take her multivitamin daily rather than taking different supplements on certain days  -Recommended for patient to take multivitamin once daily - will stop other supplements   Patient Goals/Self-Care Activities . Patient will:  -  take medications as prescribed focus on medication adherence by taking rosuvastatin 20mg d29m as prescribed   Follow Up Plan: Telephone follow up appointment with care management team member scheduled for: The patient has been provided with contact information for the care management team and has been advised to call with any health related questions or concerns.         Medication Assistance: None required.  Patient affirms current coverage meets needs.  Patient's preferred pharmacy is:  [id number] [id number] [place] [phone] (MAIL ORDER) [phone] box? Yes Pt endorses 90% compliance  Care Plan and Follow Up Patient Decision:  Patient agrees to Care Plan and Follow-up.  Plan: Telephone follow up appointment with care management team member scheduled for:  2 months  and The patient has been provided with contact information for the care management team and has been advised to call with any health related questions or concerns.   Bertram Haddix CTomasa Blase Clinical Pharmacist, [place]22

## 2020-12-29 ENCOUNTER — Other Ambulatory Visit: Payer: Self-pay

## 2020-12-29 ENCOUNTER — Ambulatory Visit (INDEPENDENT_AMBULATORY_CARE_PROVIDER_SITE_OTHER): Payer: Medicare Other

## 2020-12-29 DIAGNOSIS — I700 Atherosclerosis of aorta: Secondary | ICD-10-CM

## 2020-12-29 DIAGNOSIS — M8949 Other hypertrophic osteoarthropathy, multiple sites: Secondary | ICD-10-CM

## 2020-12-29 DIAGNOSIS — E785 Hyperlipidemia, unspecified: Secondary | ICD-10-CM | POA: Diagnosis not present

## 2020-12-29 DIAGNOSIS — J449 Chronic obstructive pulmonary disease, unspecified: Secondary | ICD-10-CM

## 2020-12-29 DIAGNOSIS — M5030 Other cervical disc degeneration, unspecified cervical region: Secondary | ICD-10-CM

## 2020-12-29 DIAGNOSIS — M159 Polyosteoarthritis, unspecified: Secondary | ICD-10-CM

## 2020-12-29 DIAGNOSIS — M150 Primary generalized (osteo)arthritis: Secondary | ICD-10-CM

## 2020-12-29 DIAGNOSIS — K219 Gastro-esophageal reflux disease without esophagitis: Secondary | ICD-10-CM

## 2020-12-29 NOTE — Patient Instructions (Addendum)
Visit Information   PATIENT GOALS:  Goals Addressed            This Visit's Progress   . Manage My Medicine       Timeframe:  Long-Range Goal Priority:  High Start Date:   12/29/2020                         Expected End Date: 06/30/2021                   Follow Up Date 02/28/2021   - keep a list of all the medicines I take; vitamins and herbals too - use a pillbox to sort medicine    Why is this important?   . These steps will help you keep on track with your medicines. . Taking Statin medication will help prevent heart attack / stroke    Notes: Patient to start taking rosuvastatin 43m daily - will reach out with any issues or concerns - has lab work that is to be scheduled 01/2021 - following up with Dr. CStanford Breed(Cardiology) after that         Consent to CCM Services: Ms. RIsazawas given information about Chronic Care Management services today including:  1. CCM service includes personalized support from designated clinical staff supervised by her physician, including individualized plan of care and coordination with other care providers 2. 24/7 contact phone numbers for assistance for urgent and routine care needs. 3. Service will only be billed when office clinical staff spend 20 minutes or more in a month to coordinate care. 4. Only one practitioner may furnish and bill the service in a calendar month. 5. The patient may stop CCM services at any time (effective at the end of the month) by phone call to the office staff. 6. The patient will be responsible for cost sharing (co-pay) of up to 20% of the service fee (after annual deductible is met).  Patient agreed to services and verbal consent obtained.   Patient verbalizes understanding of instructions provided today and agrees to view in [place]   Telephone follow up appointment with care management team member scheduled for: The patient has been provided with contact information for the care management team and has  been advised to call with any health related questions or concerns.   DTomasa Blase PharmD Clinical Pharmacist, [place]      CLINICAL CARE PLAN: Patient Care Plan: CCM Care Plan    Problem Identified: HLD, Prediabetes, GERD, COPD, Chronic Pain, Anxiety   Priority: High  Onset Date: 12/29/2020    Long-Range Goal: Disease Management   Start Date: 12/29/2020  Expected End Date: 06/30/2021  This Visit's Progress: On track  Priority: High  Note:   Current Barriers:  . Unable to independently monitor therapeutic efficacy . Unable to achieve control of LDL    Pharmacist Clinical Goal(s):  .Marland KitchenPatient will achieve adherence to monitoring guidelines and medication adherence to achieve therapeutic efficacy . achieve control of LDL  as evidenced by next lipid panel  . adhere to prescribed medication regimen as evidenced by improvement in LDL - adherence to statin medication  through collaboration with PharmD and provider.   Interventions: . 1:1 collaboration with JJanith Lima MD regarding development and update of comprehensive plan of care as evidenced by provider attestation and co-signature . Inter-disciplinary care team collaboration (see longitudinal plan of care) . Comprehensive medication review performed; medication list updated in electronic medical  record  Hyperlipidemia: (LDL goal < 100) -Uncontrolled  -Last LDL 125 mg/dL (10/24/2020) -Current treatment: . Rosuvastatin 67m daily - not currently taking  -Medications previously tried: atorvastatin   -Current dietary patterns: patient has been eating more salads, has stopped eating pork, recently has only been eating chicken  -Current exercise habits: walking every other day  -Educated on Cholesterol goals;  Benefits of statin for ASCVD risk reduction; Importance of limiting foods high in cholesterol; Strategies to manage statin-induced myalgias; -Recommended for patient to start use of rosuvastatin 266m daily  Educated on benefits of statin medication, reviewed possible side effects that could occur from taking statin and way that side effects can be minimized / eliminated   Prediabetes (A1c goal <7%) -Controlled (diet) - Last A1c 6.1% (10/28/2020) -Current medications: . n/a -Medications previously tried: n/a  -Current meal patterns:  . breakfast: blueberries, boiled egg, oatmeal, cereal, yogurt, coffee, water   . lunch: salad, bowl of soup  . dinner: spinach salad, protein . snacks: apple / banana if snacking  . drinks: water, coffee -Current exercise: walking every other day  -Educated on A1c and blood sugar goals; Complications of diabetes including kidney damage, retinal damage, and cardiovascular disease; Benefits of weight loss; -Counseled to check feet daily and get yearly eye exams -Counseled on diet and exercise extensively Recommended to continue current medication  COPD (Goal: control symptoms and prevent exacerbations) -Controlled -Current treatment  . Albuterol 0.083% nebulizer solution - 1 vial every 6 hours as needed  . Albuterol 10831m- inhale 1-2 puffs every 4 hours as needed - using 0-3 times daily  -Medications previously tried: advair   -MMRC score: 3 -Pulmonary function testing:  - FEV1/FVC = 0.77 -Exacerbations requiring treatment in last 6 months: n/a -Patient declines consistent use of maintenance inhaler, per previous PCP notes, will only use albuterol inhaler  -Frequency of rescue inhaler use: some days does not have to use at all - some days can use 3 times daily  -Counseled on treatment of exacerbations and when to reach out to clinic, feels that COPD is controlled on current regimen  -Recommended to continue current medication   Anxiety/ OCD (Goal: Prevention of anxiety attack/ mood control ) -Not ideally controlled -Current treatment: . Alprazolam 1mg55mery 6 hours as needed - effective when taken   -Medications previously tried/failed:  fluvoxamine, escitalopram, clonazepam  -GAD7: 18 -Discussed referral to psych for counseling / prefers to continue current regimen at this time - Feels that her anxiety has improved since she has started reading the bible and praying every AM (started in 2019) -Educated on Benefits of medication for symptom control Benefits of cognitive-behavioral therapy with or without medication -Recommended to continue current medication Reviewed with patient to reach out should she feel that she would like to be referred to pscyh  Chronic Pain - DDD and OA (Goal: Pain control / prevention of flares ) -Not ideally controlled -Current treatment  . Hydrocodone/APAP 10-325mg69m tablet every 4 hours as needed for pain - taking 3 tablets daily  . Ibuprofen 800mg 20mtablet every 8 hours as needed  - only uses 1 tablet on occasion  . Narcan 4mg/0.54m - as needed for respiratory depression  -Medications previously tried: tramadol   -Recommended for patient to use ibuprofen up to every 8 hours as needed as she does find benefit from medication when she take, but only taking on occasion at this time   - educated patient on safety concern with  use of hydrocodone and alprazolam in combination - reviewed with patient appropriate use of narcan and to ensure that her son (who lives with her) is aware of proper administration in case of emergency   GERD (Goal: Acid Control / Prevention of reflux) -Controlled -Current treatment  . Esomeprazole 7m daily  -Medications previously tried: omeprazole   -Recommended to continue current medication   Health Maintenance -Current therapy:  .Marland KitchenVitamin B12 - 1000 mcg daily as needed  . Folic Acid 1 mg daily as needed  . Magnesium Oxide 4055mdaily as needed  . Cod Liver Oil 100066maily as needed  . Multivitamin - 1 tablet daily  - as needed  -Educated on Herbal supplement research is limited and benefits usually cannot be proven Cost vs benefit of each product must be  carefully weighed by individual consumer Supplements may interfere with prescription drugs -Patient is not satisfied with therapy and will discontinue folic acid, mangesium oxide, and cod liver - plan at this time will be for patient to take her multivitamin daily rather than taking different supplements on certain days  -Recommended for patient to take multivitamin once daily   Patient Goals/Self-Care Activities . Patient will:  - take medications as prescribed focus on medication adherence by taking rosuvastatin 89m38mily as prescribed   Follow Up Plan: Telephone follow up appointment with care management team member scheduled for: The patient has been provided with contact information for the care management team and has been advised to call with any health related questions or concerns.        Atherosclerosis  Atherosclerosis is narrowing and hardening of the arteries. Arteries are blood vessels that carry blood from the heart to all parts of the body. This blood contains oxygen. Arteries can become narrow or blocked from inflammation or from a buildup of fat, cholesterol, calcium, and other substances (plaque). Plaque decreases the amount of blood that can flow through the artery. Atherosclerosis can affect any artery in your body, including:  Heart arteries. Damage to these arteries may lead to coronary artery disease, which can cause a heart attack.  Brain arteries. Damage to these arteries may cause a stroke.  Leg, arm, and pelvis arteries. Peripheral artery disease (PAD) may result from damage to these arteries.  Kidney arteries. Kidney (renal) failure may result from damage to kidney arteries. Treatment may slow the disease and prevent further damage to your heart, brain, peripheral arteries, and kidneys. What are the causes? This condition develops slowly over many years. The inner layers of your arteries become damaged and allow the gradual buildup of plaque. The exact  cause of atherosclerosis is not fully understood. Symptoms of atherosclerosis do not occur until an artery becomes narrow or blocked. What increases the risk? The following factors may make you more likely to develop this condition:  Being middle-aged or older.  Certain medical conditions, including: ? High blood pressure. ? High cholesterol. ? High blood fats (triglycerides). ? Diabetes. ? Sleep apnea.  A family history of atherosclerosis.  Being overweight.  Using products that contain tobacco or nicotine.  Not exercising enough (sedentary lifestyle).  Having a substance in your blood called C-reactive protein (CRP). This is a sign of increased levels of inflammation in your body.  Being stressed.  Drinking too much alcohol or using drugs, such as cocaine or methamphetamine. What are the signs or symptoms? Symptoms of atherosclerosis may not be obvious until there is damage to an area of your body that is not  getting enough blood. Sometimes, atherosclerosis does not cause symptoms. Symptoms of this condition include:  Coronary artery disease. This may cause chest pain and shortness of breath.  Decreased blood supply to your brain, which may cause a stroke. Signs of a stroke may include sudden: ? Weakness or numbness in your face, arm, or leg, especially on one side of your body. ? Trouble walking or difficulty moving your arms or legs. ? Loss of balance or coordination. ? Confusion. ? Slurred speech (dysarthria). ? Trouble speaking, or trouble understanding speech, or both (aphasia). ? Vision changes in one or both eyes. This may be double vision, blurred vision, or loss of vision. ? Severe headache with no known cause. The headache is often described as the worst headache ever experienced.  PAD, which may cause pain and numbness, often in your legs and hips.  Renal failure. This may cause tiredness, problems with urination, swelling, and itchy skin. How is this  diagnosed? This condition is diagnosed based on your medical history and a physical exam. During the exam, your health care provider will:  Check your pulse in different places.  Listen for a "whooshing" sound over your arteries (bruit). You may also have tests, such as:  Blood tests to check your levels of cholesterol, triglycerides, and CRP.  Electrocardiogram (ECG) to check for heart damage.  Chest X-ray to see if you have an enlarged heart, which is a sign of heart failure.  Stress test to see how your heart reacts to exercise.  Echocardiogram to get images of the inside of your heart.  Ankle-brachial index to compare blood pressure in your arms to blood pressure in your ankles.  Ultrasound of your peripheral arteries to check blood flow.  CT scan to check for damage to your heart or brain.  X-rays of blood vessels after dye has been injected (angiogram) to check blood flow. How is this treated? This condition is treated with lifestyle changes as the first step. These may include:  Changing your diet.  Losing weight.  Reducing stress.  Exercising and being physically active more regularly.  Quitting smoking. You may also need medicine to:  Lower triglycerides and cholesterol.  Control blood pressure.  Prevent blood clots.  Lower inflammation in your body.  Control your blood sugar. Sometimes, surgery is needed to:  Remove plaque from an artery (endarterectomy).  Open or widen a narrowed heart artery (angioplasty).  Create a new path for your blood with one of these procedures: ? Heart (coronary) artery bypass graft surgery. ? Peripheral artery bypass graft surgery. Follow these instructions at home: Eating and drinking  Eat a heart-healthy diet. Talk with your health care provider or a diet and nutrition specialist (dietitian) if you need help. A heart-healthy diet involves: ? Limiting unhealthy fats and increasing healthy fats. Some examples of  healthy fats are olive oil and canola oil. ? Eating plant-based foods, such as fruits, vegetables, nuts, whole grains, and legumes (such as peas and lentils).  If you drink alcohol: ? Limit how much you use to:  0-1 drink a day for nonpregnant women.  0-2 drinks a day for men. ? Be aware of how much alcohol is in your drink. In the [place] one drink equals one 12 oz bottle of beer (355 mL), one 5 oz glass of wine (148 mL), or one 1 oz glass of hard liquor (44 mL).   Lifestyle  Maintain a healthy weight. Lose weight if your health care provider says that you need  to do that.  Follow an exercise program as told by your health care provider.  Do not use any products that contain nicotine or tobacco, such as cigarettes, e-cigarettes, and chewing tobacco. If you need help quitting, ask your health care provider.  Do not use drugs.   General instructions  Take over-the-counter and prescription medicines only as told by your health care provider.  Manage other health conditions as told.  Keep all follow-up visits as told by your health care provider. This is important. Contact a health care provider if you have:  An irregular heartbeat.  Unexplained fatigue.  Trouble urinating, or you are producing less urine or foamy urine.  Swelling of your hands or feet, or itchy skin.  Unexplained pain or numbness in your legs or hips. Get help right away if:  You have any symptoms of a heart attack. These may be: ? Chest pain. This includes squeezing chest pain that may feel like indigestion (angina). ? Shortness of breath. ? Pain in your neck, jaw, arms, back, or stomach. ? Cold sweat. ? Nausea. ? Light-headedness.  You have any symptoms of a stroke. "BE FAST" is an easy way to remember the main warning signs of a stroke: ? B - Balance. Signs are dizziness, sudden trouble walking, or loss of balance. ? E - Eyes. Signs are trouble seeing or a sudden change in vision. ? F - Face. Signs  are sudden weakness or numbness of the face, or the face or eyelid drooping on one side. ? A - Arms. Signs are weakness or numbness in an arm. This happens suddenly and usually on one side of the body. ? S - Speech. Signs are sudden trouble speaking, slurred speech, or trouble understanding what people say. ? T - Time. Time to call emergency services. Write down what time symptoms started.  You have other signs of a stroke, such as: ? A sudden, severe headache with no known cause. ? Nausea or vomiting. ? Seizure. These symptoms may represent a serious problem that is an emergency. Do not wait to see if the symptoms will go away. Get medical help right away. Call your local emergency services (911 in the [place] Do not drive yourself to the hospital. Summary  Atherosclerosis is narrowing and hardening of the arteries.  Arteries can become narrow from inflammation or from a buildup of fat, cholesterol, calcium, and other substances (plaque).  This condition may not cause any symptoms. If you do have symptoms, they are caused by damage to an area of your body that is not getting enough blood.  Treatment starts with lifestyle changes and may include medicines. In some cases, surgery is needed.  Get help right away if you have any symptoms of a heart attack or stroke. This information is not intended to replace advice given to you by your health care provider. Make sure you discuss any questions you have with your health care provider. Document Revised: 05/20/2019 Document Reviewed: 05/20/2019 Elsevier Patient Education  [place].

## 2021-01-06 DIAGNOSIS — J449 Chronic obstructive pulmonary disease, unspecified: Secondary | ICD-10-CM | POA: Diagnosis not present

## 2021-01-15 ENCOUNTER — Other Ambulatory Visit: Payer: Self-pay | Admitting: Internal Medicine

## 2021-01-18 ENCOUNTER — Telehealth: Payer: Self-pay | Admitting: Internal Medicine

## 2021-01-18 DIAGNOSIS — J4520 Mild intermittent asthma, uncomplicated: Secondary | ICD-10-CM | POA: Diagnosis not present

## 2021-01-18 NOTE — Telephone Encounter (Signed)
Patient requesting refill for HYDROcodone-acetaminophen (NORCO) 10-325 MG tablet  Pharmacy [id number] - [place], [place].

## 2021-01-19 DIAGNOSIS — H5213 Myopia, bilateral: Secondary | ICD-10-CM | POA: Diagnosis not present

## 2021-01-20 ENCOUNTER — Other Ambulatory Visit: Payer: Self-pay | Admitting: Internal Medicine

## 2021-01-20 DIAGNOSIS — M5030 Other cervical disc degeneration, unspecified cervical region: Secondary | ICD-10-CM

## 2021-01-20 DIAGNOSIS — M159 Polyosteoarthritis, unspecified: Secondary | ICD-10-CM

## 2021-01-20 MED ORDER — HYDROCODONE-ACETAMINOPHEN 10-325 MG PO TABS: 1.0000 | ORAL_TABLET | ORAL | 0 refills | Status: DC | PRN

## 2021-01-21 NOTE — Telephone Encounter (Signed)
[facility] does not have  Hydrocodone in stock, pleas send new order to [place], [place] 

## 2021-01-22 ENCOUNTER — Other Ambulatory Visit: Payer: Self-pay | Admitting: Internal Medicine

## 2021-01-22 DIAGNOSIS — M159 Polyosteoarthritis, unspecified: Secondary | ICD-10-CM

## 2021-01-22 DIAGNOSIS — M5030 Other cervical disc degeneration, unspecified cervical region: Secondary | ICD-10-CM

## 2021-01-22 MED ORDER — HYDROCODONE-ACETAMINOPHEN 10-325 MG PO TABS: 1.0000 | ORAL_TABLET | ORAL | 0 refills | Status: DC | PRN

## 2021-02-01 DIAGNOSIS — J449 Chronic obstructive pulmonary disease, unspecified: Secondary | ICD-10-CM | POA: Diagnosis not present

## 2021-02-11 ENCOUNTER — Ambulatory Visit: Payer: Medicare Other | Admitting: Internal Medicine

## 2021-02-15 ENCOUNTER — Telehealth: Payer: Self-pay | Admitting: Internal Medicine

## 2021-02-15 NOTE — Telephone Encounter (Signed)
1.Medication Requested: HYDROcodone-acetaminophen (NORCO) 10-325 MG tablet 2. Pharmacy (Name, [address], [place]): [id number] [place] [place] Phone:  [phone] Fax:  [phone]    3. On Med List: Y  4. Last Visit with PCP: 10/28/2020  5. Next visit date with PCP: 04/26/2021   Agent: Please be advised that RX refills may take up to 3 business days. We ask that you follow-up with your pharmacy.

## 2021-02-19 ENCOUNTER — Encounter: Payer: Self-pay | Admitting: *Deleted

## 2021-02-19 ENCOUNTER — Other Ambulatory Visit: Payer: Self-pay | Admitting: Internal Medicine

## 2021-02-19 DIAGNOSIS — K219 Gastro-esophageal reflux disease without esophagitis: Secondary | ICD-10-CM

## 2021-02-21 ENCOUNTER — Other Ambulatory Visit: Payer: Self-pay | Admitting: Internal Medicine

## 2021-02-21 DIAGNOSIS — M5030 Other cervical disc degeneration, unspecified cervical region: Secondary | ICD-10-CM

## 2021-02-21 DIAGNOSIS — M159 Polyosteoarthritis, unspecified: Secondary | ICD-10-CM

## 2021-02-21 DIAGNOSIS — M8949 Other hypertrophic osteoarthropathy, multiple sites: Secondary | ICD-10-CM

## 2021-02-21 MED ORDER — HYDROCODONE-ACETAMINOPHEN 10-325 MG PO TABS: 1.0000 | ORAL_TABLET | ORAL | 0 refills | Status: DC | PRN

## 2021-02-24 DIAGNOSIS — J449 Chronic obstructive pulmonary disease, unspecified: Secondary | ICD-10-CM | POA: Diagnosis not present

## 2021-02-27 DIAGNOSIS — H5203 Hypermetropia, bilateral: Secondary | ICD-10-CM | POA: Diagnosis not present

## 2021-03-05 ENCOUNTER — Other Ambulatory Visit: Payer: Self-pay | Admitting: Internal Medicine

## 2021-03-05 DIAGNOSIS — J4520 Mild intermittent asthma, uncomplicated: Secondary | ICD-10-CM

## 2021-03-09 ENCOUNTER — Other Ambulatory Visit: Payer: Self-pay

## 2021-03-09 ENCOUNTER — Ambulatory Visit (INDEPENDENT_AMBULATORY_CARE_PROVIDER_SITE_OTHER): Payer: Medicare Other

## 2021-03-09 DIAGNOSIS — J449 Chronic obstructive pulmonary disease, unspecified: Secondary | ICD-10-CM | POA: Diagnosis not present

## 2021-03-09 DIAGNOSIS — E785 Hyperlipidemia, unspecified: Secondary | ICD-10-CM | POA: Diagnosis not present

## 2021-03-09 DIAGNOSIS — I700 Atherosclerosis of aorta: Secondary | ICD-10-CM

## 2021-03-09 DIAGNOSIS — K219 Gastro-esophageal reflux disease without esophagitis: Secondary | ICD-10-CM

## 2021-03-09 NOTE — Progress Notes (Signed)
Chronic Care Management Pharmacy Note  03/09/2021 Name:  Felicia Odom MRN:  [id number] DOB:  [birth year]   Summary: - Patient reports that since last visit she has made major changes in diet and exercise, has stopped eating sweets and snacking on foods high in carbohydrates, instead is eating fresh vegetables and fruits - reports that weight loss, is now down to 189lbs (form 220lbs) -reports to increased activity and exercise since last visit  - Notes that breathing has been improved with recent weight loss, feels that she has more energy, feeling better -Due to changes in diet and weight, has not yet started rosuvastatin, would like to have lipid panel rechecked is agreeable to start rosuvastatin if she remains elevated at that time  -Has stopped all other supplements, is now only taking multivitamin daily  Recommendations/Changes made from today's visit: - Patient to continue with positive changes in diet and exercise, patient will have lipid panel rechecked in November - has follow up with cardiology - is agreeable to start rosuvastatin should LDL remain elevated, will follow up with patient after cardiology appointment to review any changes that are made at that appointment  Subjective: Felicia Odom is an 65 y.o. year old female who is a primary patient of [name], MD.  The CCM team was consulted for assistance with disease management and care coordination needs.    Engaged with patient by telephone for follow up visit in response to provider referral for pharmacy case management and/or care coordination services.   Consent to Services:  The patient was given the following information about Chronic Care Management services today, agreed to services, and gave verbal consent: 1. CCM service includes personalized support from designated clinical staff supervised by the primary care provider, including individualized plan of care and coordination with other care  providers 2. 24/7 contact phone numbers for assistance for urgent and routine care needs. 3. Service will only be billed when office clinical staff spend 20 minutes or more in a month to coordinate care. 4. Only one practitioner may furnish and bill the service in a calendar month. 5.The patient may stop CCM services at any time (effective at the end of the month) by phone call to the office staff. 6. The patient will be responsible for cost sharing (co-pay) of up to 20% of the service fee (after annual deductible is met). Patient agreed to services and consent obtained.  Patient Care Team: [name], MD as PCP - General (Internal Medicine) Delice Bison Darnelle Maffucci, [facility] as Pharmacist (Pharmacist)  Recent office visits: No change since last visit   Recent consult visits: No change since last visit   Hospital visits: None in previous 6 months  Objective:  Lab Results  Component Value Date   CREATININE 0.71 10/28/2020   BUN 13 10/28/2020   GFR 90.10 10/28/2020   GFRNONAA >60 06/12/2016   GFRAA >60 06/12/2016   NA 138 10/28/2020   K 4.5 10/28/2020   CALCIUM 9.2 10/28/2020   CO2 29 10/28/2020   GLUCOSE 106 (H) 10/28/2020    Lab Results  Component Value Date/Time   HGBA1C 6.1 10/28/2020 08:27 AM   HGBA1C 6.2 01/28/2020 09:07 AM   GFR 90.10 10/28/2020 08:27 AM   GFR 84.44 01/28/2020 09:07 AM   MICROALBUR <0.7 10/28/2020 08:27 AM   MICROALBUR <0.7 08/19/2019 03:59 PM    Last diabetic Eye exam:  Lab Results  Component Value Date/Time   HMDIABEYEEXA Retinopathy (A) 01/01/2019 12:00 AM  Last diabetic Foot exam:  No results found for: HMDIABFOOTEX   Lab Results  Component Value Date   CHOL 200 10/28/2020   HDL 45.10 10/28/2020   LDLCALC 125 (H) 10/28/2020   LDLDIRECT 142.0 01/21/2015   TRIG 151.0 (H) 10/28/2020   CHOLHDL 4 10/28/2020    Hepatic Function Latest Ref Rng & Units 08/08/2019 05/20/2019 07/30/2018  Total Protein 6.0 - 8.3 g/dL 7.2 6.5 6.7  Albumin 3.5 - 5.2  g/dL 4.1 4.0 3.9  AST 0 - 37 U/L _0 ALT 0 - 35 U/L _1 Alk Phosphatase 39 - 117 U/L 71 82 62  Total Bilirubin 0.2 - 1.2 mg/dL 0.5 0.3 0.3  Bilirubin, Direct 0.00 - 0.40 mg/dL - 0.09 -    Lab Results  Component Value Date/Time   TSH 1.98 10/28/2020 08:27 AM   TSH 2.10 08/08/2019 10:30 AM    CBC Latest Ref Rng & Units 01/28/2020 08/08/2019 07/30/2018  WBC 4.0 - 10.5 K/uL 5.3 7.5 5.4  Hemoglobin 12.0 - 15.0 g/dL 14.8 14.8 14.7  Hematocrit 36.0 - 46.0 % 43.1 44.5 43.8  Platelets 150.0 - 400.0 K/uL 235.0 291.0 266.0    No results found for: VD25OH  Clinical ASCVD: No  The 10-year ASCVD risk score Mikey Bussing DC Jr., et al., 2013) is: 10.4%   Values used to calculate the score:     Age: 32 years     Sex: Female     Is Non-Hispanic African American: No     Diabetic: Yes     Tobacco smoker: No     Systolic Blood Pressure: 623 mmHg     Is BP treated: No     HDL Cholesterol: 45.1 mg/dL     Total Cholesterol: 200 mg/dL    Depression screen [facility] 2/9 10/28/2020 04/29/2020 01/28/2020  Decreased Interest 0 0 0  Down, Depressed, Hopeless 0 0 0  PHQ - 2 Score 0 0 0  Altered sleeping 0 0 -  Tired, decreased energy 0 0 -  Change in appetite 0 0 -  Feeling bad or failure about yourself  0 0 -  Trouble concentrating 0 0 -  Moving slowly or fidgety/restless 0 0 -  Suicidal thoughts 0 0 -  PHQ-9 Score 0 0 -  Some recent data might be hidden     Social History   Tobacco Use  Smoking Status Former   Packs/day: 1.00   Years: 36.00   Pack years: 36.00   Types: Cigarettes   Quit date: 2017   Years since quitting: 5.6  Smokeless Tobacco Never  Tobacco Comments   last cigarette 2017   BP Readings from Last 3 Encounters:  10/28/20 128/84  08/12/20 122/80  04/29/20 136/80   Pulse Readings from Last 3 Encounters:  10/28/20 77  08/12/20 78  04/29/20 68   Wt Readings from Last 3 Encounters:  10/28/20 196 lb (88.9 kg)  08/12/20 202 lb (91.6 kg)  06/26/20 200 lb (90.7 kg)   BMI  Readings from Last 3 Encounters:  10/28/20 31.16 kg/m  08/12/20 32.12 kg/m  06/26/20 31.80 kg/m    Assessment/Interventions: Review of patient past medical history, allergies, medications, health status, including review of consultants reports, laboratory and other test data, was performed as part of comprehensive evaluation and provision of chronic care management services.   SDOH:  (Social Determinants of Health) assessments and interventions performed: Yes  SDOH Screenings   Alcohol Screen: Not on file  Depression (JSE8-3): Low Risk  PHQ-2 Score: 0  Financial Resource Strain: Not on file  Food Insecurity: Not on file  Housing: Not on file  Physical Activity: Not on file  Social Connections: Not on file  Stress: Not on file  Tobacco Use: Medium Risk   Smoking Tobacco Use: Former   Smokeless Tobacco Use: Never  Transportation Needs: Not on file    [place]  Allergies  Allergen Reactions   No Known Allergies     Medications Reviewed Today     Reviewed by Tomasa Blase, [facility] (Pharmacist) on 03/09/21 at [place] List Status: <None>   Medication Order Taking? Sig Documenting Provider Last Dose Status Informant  albuterol (PROVENTIL) (2.5 MG/3ML) 0.083% nebulizer solution [id number] Yes USE 1 VIAL IN NEBULIZER EVERY 6 HOURS - And As Needed Baird Lyons D, MD Taking Active   albuterol (VENTOLIN HFA) 108 (90 Base) MCG/ACT inhaler [id number] Yes INHALE 1 TO 2 PUFFS INTO THE LUNGS EVERY 4 HOURS AS NEEDED FOR WHEEZING OR SHORTNESS OF BREATH Young, Kasandra Knudsen, MD Taking Active   ALPRAZolam Duanne Moron) 1 MG tablet [id number] Yes Take 1 tablet (1 mg total) by mouth every 6 (six) hours as needed. Baird Lyons D, MD Taking Active   esomeprazole (NEXIUM) 40 MG capsule [id number] Yes TAKE 1 CAPSULE BY MOUTH EVERY DAY [name], MD Taking Active   HYDROcodone-acetaminophen [facility]) 10-325 MG tablet [id number] Yes Take 1 tablet by mouth every 4 (four) hours as needed (for pain).  [name], MD Taking Active   ibuprofen (ADVIL) 800 MG tablet [id number] Yes TAKE 1 TABLET BY MOUTH EVERY 8 HOURS AS NEEDED [name], MD Taking Active   Multiple Vitamin (MULTIVITAMIN) tablet [id number] Yes Take 1 tablet by mouth daily. [provider] Taking Active   NARCAN 4 MG/0.1ML LIQD nasal spray kit [id number] Yes SMARTSIG:Both Nares [provider] Taking Active     Discontinued 10/06/11 1336 (Discontinued by provider)   rosuvastatin (CRESTOR) 20 MG tablet [id number] No Take 1 tablet (20 mg total) by mouth daily.  Patient not taking: No sig reported   [name], MD Not Taking Active             Patient Active Problem List   Diagnosis Date Noted   Thyroid nodule 07/30/2020   Moderate episode of recurrent major depressive disorder ([place]) 01/28/2020   Routine general medical examination at a health care facility 01/28/2020   COPD mixed type ([place]) 02/18/2019   Type II diabetes mellitus with manifestations ([place]) 02/18/2019   Eczema 11/29/2017   Encounter for long-term opiate analgesic use 07/06/2017   Moderate tetrahydrocannabinol (THC) dependence ([place]) 07/26/2016   Essential hypertension 09/25/2015   DDD (degenerative disc disease), cervical 06/15/2015   Seborrheic dermatitis of scalp 02/11/2015   Primary osteoarthritis involving multiple joints 01/21/2015   Hyperlipidemia with target LDL less than 100 01/21/2015   GERD (gastroesophageal reflux disease) 05/08/2013   Atherosclerosis of aorta ([place]) 05/08/2013   Allergic rhinitis 10/01/2010   PANIC DISORDER 06/08/2007   Obsessive-compulsive disorder 06/08/2007    Immunization History  Administered Date(s) Administered   Influenza-Unspecified 03/26/2015   PFIZER(Purple Top)SARS-COV-2 Vaccination 09/28/2019, 10/19/2019, 05/19/2020   Tdap 09/23/2015   Zoster Recombinat (Shingrix) 03/27/2018, 06/08/2018    Conditions to be addressed/monitored:  Hypertension, Hyperlipidemia, Diabetes, Coronary  Artery Disease, COPD and Osteoarthritis  Care Plan : CCM Care Plan  Updates made by Tomasa Blase, RPH since 12/29/2020 12:00 AM     Problem: HLD, Prediabetes, GERD, COPD, Chronic  Pain, Anxiety   Priority: High  Onset Date: 12/29/2020     Long-Range Goal: Disease Management   Start Date: 12/29/2020  Expected End Date: 06/30/2021  This Visit's Progress: On track  Priority: High  Note:   Current Barriers:  Unable to independently monitor therapeutic efficacy Unable to achieve control of LDL    Pharmacist Clinical Goal(s):  Patient will achieve adherence to monitoring guidelines and medication adherence to achieve therapeutic efficacy achieve control of LDL  as evidenced by next lipid panel  adhere to prescribed medication regimen as evidenced by improvement in LDL - adherence to statin medication  through collaboration with PharmD and provider.   Interventions: 1:1 collaboration with [name], MD regarding development and update of comprehensive plan of care as evidenced by provider attestation and co-signature Inter-disciplinary care team collaboration (see longitudinal plan of care) Comprehensive medication review performed; medication list updated in electronic medical record  Hyperlipidemia: (LDL goal < 100) -Uncontrolled  -Last LDL 125 mg/dL (10/24/2020) -Current treatment: Rosuvastatin 20mg  daily - not currently taking  -Medications previously tried: atorvastatin   -Current dietary patterns: patient has been eating more salads, has stopped eating pork, recently has only been eating chicken  -Current exercise habits: walking every other day  -Educated on Cholesterol goals;  Benefits of statin for ASCVD risk reduction; Importance of limiting foods high in cholesterol; Strategies to manage statin-induced myalgias; -Recommended for patient to start use of rosuvastatin 20mg  daily  Educated on benefits of statin medication, reviewed possible side effects that could occur  from taking statin and way that side effects can be minimized / eliminated   Prediabetes (A1c goal <7%) -Controlled (diet) - Last A1c 6.1% (10/28/2020) -Current medications: n/a -Medications previously tried: n/a  -Current meal patterns:  breakfast: blueberries, boiled egg, oatmeal, cereal, yogurt, coffee, water   lunch: salad, bowl of soup  dinner: spinach salad, protein snacks: apple / banana if snacking  drinks: water, coffee -Current exercise: walking every other day  -Educated on A1c and blood sugar goals; Complications of diabetes including kidney damage, retinal damage, and cardiovascular disease; Benefits of weight loss; -Counseled to check feet daily and get yearly eye exams -Counseled on diet and exercise extensively Recommended to continue current medication  COPD (Goal: control symptoms and prevent exacerbations) -Controlled -Current treatment  Albuterol 0.083% nebulizer solution - 1 vial every 6 hours as needed  Albuterol 170mcg - inhale 1-2 puffs every 4 hours as needed - using 0-3 times daily  -Medications previously tried: advair   -MMRC score: 3 -Pulmonary function testing:  - FEV1/FVC = 0.77 -Exacerbations requiring treatment in last 6 months: n/a -Patient declines consistent use of maintenance inhaler, per previous PCP notes, will only use albuterol inhaler  -Frequency of rescue inhaler use: some days does not have to use at all - some days can use 3 times daily  -Counseled on treatment of exacerbations and when to reach out to clinic, feels that COPD is controlled on current regimen  -Recommended to continue current medication   Anxiety/ OCD (Goal: Prevention of anxiety attack/ mood control ) -Not ideally controlled -Current treatment: Alprazolam 1mg  every 6 hours as needed - effective when taken   -Medications previously tried/failed: fluvoxamine, escitalopram, clonazepam  -GAD7: 18 -Discussed referral to psych for counseling / prefers to continue current  regimen at this time - Feels that her anxiety has improved since she has started reading the bible and praying every AM (started in 2019) -Educated on Benefits of medication for symptom control  Benefits of cognitive-behavioral therapy with or without medication -Recommended to continue current medication Reviewed with patient to reach out should she feel that she would like to be referred to pscyh  Chronic Pain - DDD and OA (Goal: Pain control / prevention of flares ) -Not ideally controlled -Current treatment  Hydrocodone/APAP 10-338m - 1 tablet every 4 hours as needed for pain - taking 3 tablets daily  Ibuprofen 8028m- 1 tablet every 8 hours as needed  - only uses 1 tablet on occasion  Narcan 16m116m.1mL - as needed for respiratory depression  -Medications previously tried: tramadol   -Recommended for patient to use ibuprofen up to every 8 hours as needed as she does find benefit from medication when she take, but only taking on occasion at this time   - educated patient on safety concern with use of hydrocodone and alprazolam in combination - reviewed with patient appropriate use of narcan and to ensure that her son (who lives with her) is aware of proper administration in case of emergency   GERD (Goal: Acid Control / Prevention of reflux) -Controlled -Current treatment  Esomeprazole 35m78mily  -Medications previously tried: omeprazole   -Recommended to continue current medication   Health Maintenance -Current therapy:  Multivitamin - 1 tablet daily  -Educated on Herbal supplement research is limited and benefits usually cannot be proven Cost vs benefit of each product must be carefully weighed by individual consumer Supplements may interfere with prescription drugs -Patient is not satisfied with therapy and will discontinue folic acid, mangesium oxide, and cod liver - plan at this time will be for patient to take her multivitamin daily rather than taking different supplements on  certain days  -Recommended for patient to take multivitamin once daily - will stop other supplements   Patient Goals/Self-Care Activities Patient will:  - take medications as prescribed focus on medication adherence by taking rosuvastatin 20mg62mly as prescribed   Follow Up Plan: Telephone follow up appointment with care management team member scheduled for: The patient has been provided with contact information for the care management team and has been advised to call with any health related questions or concerns.         Medication Assistance: None required.  Patient affirms current coverage meets needs.  Patient's preferred pharmacy is:  [id number] [place] [address] [address]: [phone] ([place] [address] [place] [id number]: [phone] [address] [place]) [id number]: [phone] [address] [address] [address]: [phone] pill box? Yes Pt endorses 90% compliance  Care Plan and Follow Up Patient Decision:  Patient agrees to Care Plan and Follow-up.  Plan: Telephone follow up appointment with care management team member scheduled for:  3 months  and The patient has been provided with contact information for the care management team and has been advised to call with any health related questions or concerns.   DanielTomasa BlasemD Clinical Pharmacist, [place]6/22 4:01 PM

## 2021-03-09 NOTE — Patient Instructions (Signed)
Visit Information  PATIENT GOALS:  Goals Addressed             This Visit's Progress    Manage My Medicine   On track    Timeframe:  Long-Range Goal Priority:  High Start Date:   12/29/2020                         Expected End Date: 06/30/2021                   Follow Up Date 02/28/2021   - keep a list of all the medicines I take; vitamins and herbals too - use a pillbox to sort medicine    Why is this important?   These steps will help you keep on track with your medicines. Taking Statin medication will help prevent heart attack / stroke    Notes: Patient to start taking rosuvastatin '20mg'$  daily - will reach out with any issues or concerns - has lab work that is to be scheduled 01/2021 - following up with Dr. Stanford Breed (Cardiology) after that          Patient verbalizes understanding of instructions provided today and agrees to view in [place].   Telephone follow up appointment with care management team member scheduled for: 3 months The patient has been provided with contact information for the care management team and has been advised to call with any health related questions or concerns.   Tomasa Blase, PharmD Clinical Pharmacist, [place]

## 2021-03-19 DIAGNOSIS — J449 Chronic obstructive pulmonary disease, unspecified: Secondary | ICD-10-CM | POA: Diagnosis not present

## 2021-03-22 ENCOUNTER — Telehealth: Payer: Self-pay | Admitting: Internal Medicine

## 2021-03-22 NOTE — Telephone Encounter (Signed)
1.Medication Requested: HYDROcodone-acetaminophen (NORCO) 10-325 MG tablet  2. Pharmacy (Name, Street, [place]): [id number] [place] [place]  Phone:  [phone]:  [phone]  3. On Med List: yes  4. Last Visit with PCP: 04.06.22  5. Next visit date with PCP: 10.03.22   Agent: Please be advised that RX refills may take up to 3 business days. We ask that you follow-up with your pharmacy.

## 2021-03-22 NOTE — Telephone Encounter (Signed)
Pt scheduled for 8/31 @ 9.20am

## 2021-03-24 ENCOUNTER — Encounter: Payer: Self-pay | Admitting: Internal Medicine

## 2021-03-24 ENCOUNTER — Ambulatory Visit (INDEPENDENT_AMBULATORY_CARE_PROVIDER_SITE_OTHER): Payer: Medicare Other

## 2021-03-24 ENCOUNTER — Other Ambulatory Visit: Payer: Self-pay

## 2021-03-24 ENCOUNTER — Ambulatory Visit (INDEPENDENT_AMBULATORY_CARE_PROVIDER_SITE_OTHER): Payer: Medicare Other | Admitting: Internal Medicine

## 2021-03-24 ENCOUNTER — Ambulatory Visit: Payer: Medicare Other | Admitting: Internal Medicine

## 2021-03-24 VITALS — BP 124/76 | HR 62 | Temp 97.6°F | Resp 16 | Ht 66.5 in | Wt 187.0 lb

## 2021-03-24 DIAGNOSIS — M159 Polyosteoarthritis, unspecified: Secondary | ICD-10-CM

## 2021-03-24 DIAGNOSIS — M5030 Other cervical disc degeneration, unspecified cervical region: Secondary | ICD-10-CM

## 2021-03-24 DIAGNOSIS — Z79891 Long term (current) use of opiate analgesic: Secondary | ICD-10-CM | POA: Diagnosis not present

## 2021-03-24 DIAGNOSIS — M8949 Other hypertrophic osteoarthropathy, multiple sites: Secondary | ICD-10-CM

## 2021-03-24 DIAGNOSIS — I10 Essential (primary) hypertension: Secondary | ICD-10-CM

## 2021-03-24 DIAGNOSIS — E118 Type 2 diabetes mellitus with unspecified complications: Secondary | ICD-10-CM

## 2021-03-24 DIAGNOSIS — F1220 Cannabis dependence, uncomplicated: Secondary | ICD-10-CM

## 2021-03-24 DIAGNOSIS — F1410 Cocaine abuse, uncomplicated: Secondary | ICD-10-CM

## 2021-03-24 DIAGNOSIS — M542 Cervicalgia: Secondary | ICD-10-CM | POA: Diagnosis not present

## 2021-03-24 LAB — HEMOGLOBIN A1C: Hgb A1c MFr Bld: 5.8 % (ref 4.6–6.5)

## 2021-03-24 LAB — BASIC METABOLIC PANEL
BUN: 16 mg/dL (ref 6–23)
CO2: 29 mEq/L (ref 19–32)
Calcium: 9.5 mg/dL (ref 8.4–10.5)
Chloride: 103 mEq/L (ref 96–112)
Creatinine, Ser: 0.81 mg/dL (ref 0.40–1.20)
GFR: 76.71 mL/min (ref >60.00)
Glucose, Bld: 96 mg/dL (ref 70–99)
Potassium: 3.9 mEq/L (ref 3.5–5.1)
Sodium: 139 mEq/L (ref 135–145)

## 2021-03-24 LAB — HEPATIC FUNCTION PANEL
ALT: 18 U/L (ref 0–35)
AST: 20 U/L (ref 0–37)
Albumin: 4.2 g/dL (ref 3.5–5.2)
Alkaline Phosphatase: 65 U/L (ref 39–117)
Bilirubin, Direct: 0.1 mg/dL (ref 0.0–0.3)
Total Bilirubin: 0.7 mg/dL (ref 0.2–1.2)
Total Protein: 7.5 g/dL (ref 6.0–8.3)

## 2021-03-24 MED ORDER — HYDROCODONE-ACETAMINOPHEN 10-325 MG PO TABS: 1.0000 | ORAL_TABLET | ORAL | 0 refills | Status: DC | PRN

## 2021-03-24 NOTE — Progress Notes (Signed)
Subjective:  Patient ID: Felicia Odom, female    DOB: [birth year]  Age: 65 y.o. MRN: [id number]  CC: Osteoarthritis, Hypertension, and Diabetes  This visit occurred during the SARS-CoV-2 public health emergency.  Safety protocols were in place, including screening questions prior to the visit, additional usage of staff PPE, and extensive cleaning of exam room while observing appropriate contact time as indicated for disinfecting solutions.    HPI Felicia Odom presents for f/up -  She informs me that she has lost weight with lifestyle modifications.  She is active and denies chest pain, shortness of breath, palpitations, diaphoresis, dizziness, or lightheadedness.  She complains of chronic and somewhat worsening neck pain.  The neck pain does not radiate towards her extremities and she denies paresthesias.  She continues to complain of musculoskeletal pain and requests a refill of hydrocodone.  Outpatient Medications Prior to Visit  Medication Sig Dispense Refill   albuterol (PROVENTIL) (2.5 MG/3ML) 0.083% nebulizer solution USE 1 VIAL IN NEBULIZER EVERY 6 HOURS - And As Needed 120 mL 11   albuterol (VENTOLIN HFA) 108 (90 Base) MCG/ACT inhaler INHALE 1 TO 2 PUFFS INTO THE LUNGS EVERY 4 HOURS AS NEEDED FOR WHEEZING OR SHORTNESS OF BREATH 8.5 each 5   ALPRAZolam (XANAX) 1 MG tablet Take 1 tablet (1 mg total) by mouth every 6 (six) hours as needed. 120 tablet 3   esomeprazole (NEXIUM) 40 MG capsule TAKE 1 CAPSULE BY MOUTH EVERY DAY 90 capsule 1   ibuprofen (ADVIL) 800 MG tablet TAKE 1 TABLET BY MOUTH EVERY 8 HOURS AS NEEDED 180 tablet 1   Multiple Vitamin (MULTIVITAMIN) tablet Take 1 tablet by mouth daily.     NARCAN 4 MG/0.1ML LIQD nasal spray kit SMARTSIG:Both Nares     HYDROcodone-acetaminophen (NORCO) 10-325 MG tablet Take 1 tablet by mouth every 4 (four) hours as needed (for pain). 90 tablet 0   rosuvastatin (CRESTOR) 20 MG tablet Take 1 tablet (20 mg total) by mouth daily.  (Patient not taking: Reported on 03/24/2021) 90 tablet 1   No facility-administered medications prior to visit.    ROS Review of Systems  Constitutional:  Negative for diaphoresis, fatigue and unexpected weight change.  HENT: Negative.    Eyes: Negative.   Respiratory:  Negative for cough, chest tightness, shortness of breath and wheezing.   Cardiovascular:  Negative for chest pain, palpitations and leg swelling.  Gastrointestinal:  Negative for abdominal pain, constipation, diarrhea, nausea and vomiting.  Endocrine: Negative.   Genitourinary: Negative.  Negative for difficulty urinating.  Musculoskeletal:  Positive for arthralgias and neck pain. Negative for back pain and myalgias.  Skin: Negative.   Neurological: Negative.  Negative for dizziness, weakness, light-headedness and numbness.  Hematological:  Negative for adenopathy. Does not bruise/bleed easily.  Psychiatric/Behavioral: Negative.     Objective:  BP 124/76 (BP Location: Left Arm, Patient Position: Sitting, Cuff Size: Large)   Pulse 62   Temp 97.6 F (36.4 C) (Oral)   Resp 16   Ht 5' 6.5" (1.689 m)   Wt 187 lb (84.8 kg)   SpO2 94%   BMI 29.73 kg/m   BP Readings from Last 3 Encounters:  03/24/21 124/76  10/28/20 128/84  08/12/20 122/80    Wt Readings from Last 3 Encounters:  03/24/21 187 lb (84.8 kg)  10/28/20 196 lb (88.9 kg)  08/12/20 202 lb (91.6 kg)    Physical Exam Vitals reviewed.  HENT:     Nose: Nose normal.     Mouth/Throat:  Mouth: Mucous membranes are moist.  Eyes:     Conjunctiva/sclera: Conjunctivae normal.  Cardiovascular:     Rate and Rhythm: Normal rate and regular rhythm.     Heart sounds: No murmur heard. Pulmonary:     Effort: Pulmonary effort is normal.     Breath sounds: No stridor. No wheezing, rhonchi or rales.  Abdominal:     General: Abdomen is flat.     Palpations: There is no mass.     Tenderness: There is no abdominal tenderness. There is no guarding.     Hernia:  No hernia is present.  Musculoskeletal:        General: Normal range of motion.     Cervical back: Neck supple.     Right lower leg: No edema.     Left lower leg: No edema.  Lymphadenopathy:     Cervical: No cervical adenopathy.  Skin:    General: Skin is warm and dry.  Neurological:     General: No focal deficit present.     Mental Status: She is alert and oriented to person, place, and time. Mental status is at baseline.  Psychiatric:        Mood and Affect: Mood normal.        Behavior: Behavior normal.    Lab Results  Component Value Date   WBC 5.3 01/28/2020   HGB 14.8 01/28/2020   HCT 43.1 01/28/2020   PLT 235.0 01/28/2020   GLUCOSE 96 03/24/2021   CHOL 200 10/28/2020   TRIG 151.0 (H) 10/28/2020   HDL 45.10 10/28/2020   LDLDIRECT 142.0 01/21/2015   LDLCALC 125 (H) 10/28/2020   ALT 18 03/24/2021   AST 20 03/24/2021   NA 139 03/24/2021   K 3.9 03/24/2021   CL 103 03/24/2021   CREATININE 0.81 03/24/2021   BUN 16 03/24/2021   CO2 29 03/24/2021   TSH 1.98 10/28/2020   INR 0.89 06/08/2016   HGBA1C 5.8 03/24/2021   MICROALBUR <0.7 10/28/2020    US THYROID  Result Date: 08/17/2020 CLINICAL DATA:  Thyroid nodule follow-up EXAM: THYROID ULTRASOUND TECHNIQUE: Ultrasound examination of the thyroid gland and adjacent soft tissues was performed. COMPARISON:  11/02/2015 FINDINGS: Parenchymal Echotexture: Mildly heterogeneous Isthmus: 0.6 cm Right lobe: 5.1 x 2.2 x 1.9 cm Left lobe: 5.1 x 1.5 x 1.8 cm _________________________________________________________ Estimated total number of nodules >/= 1 cm: 1 Number of spongiform nodules >/=  2 cm not described below (TR1): 0 Number of mixed cystic and solid nodules >/= 1.5 cm not described below ([place]): 0 _________________________________________________________ Nodule # 1: 3.0 x 2.1 x 2.0 cm isoechoic solid nodule in the mid right thyroid lobe was previously evaluated with FNA. Please correlate with results.  _________________________________________________________ Nodule # 2: 0.8 x 0.7 x 0.7 cm isoechoic solid nodule in the inferior left thyroid lobe is not significant changed in size on the prior examination and does not meet criteria for imaging follow-up or FNA. _________________________________________________________ IMPRESSION: No significant interval change of bilateral thyroid nodules. The above is in keeping with the ACR TI-RADS recommendations - J Am Coll Radiol 2017;14:587-595. Electronically Signed   By: Miachel Roux M.D.   On: 08/17/2020 14:18     Component Ref Range & Units 12 d ago   Cannabinoid GC/MS, Ur Cutoff=50 Positive Abnormal    CARBOXY THC GC/MS CONF Cutoff=10 ng/mL 130   Resulting Agency  [facility]       Narrative Performed by: Maryan Puls Performed at:  01 - [facility]  [address] [place], [place]  [id number]  Lab Director: Avis Epley PhD, Phone:  [phone]    Specimen Collected: 03/24/21 10:05 Last Resulted: 03/31/21 13:36      Lab Flowsheet    Order Details    View Encounter    Lab and Collection Details    Routing    Result History    View Encounter Conversation        Result Care Coordination   Patient Communication   Add Comments   Seen Back to Top        Contains abnormal data Cocaine Con, Ur Order: [id number] - Reflex for Order [id number] Status: Final result   Visible to patient: Yes (seen)   Next appt: 04/22/2021 at 02:30 PM in Pulmonology Baird Lyons, MD)   0 Result Notes Component Ref Range & Units 12 d ago   Cocaine Metab Quant, Ur Cutoff=300 Positive Abnormal    Benzoylecgonine GC/MS Conf Cutoff=150 ng/mL 290   Resulting Agency  [facility]       Narrative Performed by: Maryan Puls Performed at:  [place], [place], [place]  [id number]  Lab Director: Avis Epley PhD, Phone:  [phone]    Specimen Collected: 03/24/21 10:05 Last Resulted: 03/31/21 13:36      Lab Flowsheet    Order Details    View  Encounter    Lab and Collection Details    Routing    Result History    View Encounter Conversation        Result Care Coordination   Patient Communication   Add Comments   Seen Back to Top       Opiates Confirmation, Urine Order: [id number] - Reflex for Order [id number] Status: Final result   Visible to patient: Yes (seen)   Next appt: 04/22/2021 at 02:30 PM in Pulmonology Baird Lyons, MD)   0 Result Notes Component Ref Range & Units 12 d ago  (03/24/21) 1 yr ago  (08/19/19) 2 yr ago  (02/18/19) 2 yr ago  (07/30/18) 3 yr ago  (07/06/17)  OPIATES Cutoff=300 Negative  POSITIVE R  POSITIVE R  POSITIVE Abnormal  R  POSITIVE Abnormal  R   Comment: Opiate test includes Codeine and Morphine only.  Resulting Agency  [facility]       Narrative Performed by: Maryan Puls      Assessment & Plan:   Elizabeth was seen today for osteoarthritis, hypertension and diabetes.  Diagnoses and all orders for this visit:  Essential hypertension- Her blood pressure is adequately well controlled. -     Hepatic function panel; Future -     Basic metabolic panel; Future -     Basic metabolic panel -     Hepatic function panel  DDD (degenerative disc disease), cervical -     Discontinue: HYDROcodone-acetaminophen (NORCO) 10-325 MG tablet; Take 1 tablet by mouth every 4 (four) hours as needed (for pain). -     DG Cervical Spine Complete; Future -     Ambulatory referral to Pain Clinic  Primary osteoarthritis involving multiple joints- This prescription was sent prior to her the results of the urine drug screen.  I have discontinued the hydrocodone and recommended that she follow-up with pain management regarding the cervical disc disease and osteoarthritis. -     Discontinue: HYDROcodone-acetaminophen (NORCO) 10-325 MG tablet; Take 1 tablet by mouth every 4 (four) hours as needed (for pain).  Type II diabetes mellitus with manifestations ([place])- Her  blood sugar is adequately  well controlled. -     Hepatic function panel; Future -     Hemoglobin A1c; Future -     Basic metabolic panel; Future -     Basic metabolic panel -     Hemoglobin A1c -     Hepatic function panel  Long-term current use of opiate analgesic- Her urine drug screen is negative for opiates and benzos.  It is positive for cocaine and THC.  I will discontinue the prescription for hydrocodone. -     Urine drugs of abuse scrn w alc, routine (Ref Lab); Future -     Urine drugs of abuse scrn w alc, routine (Ref Lab)  Cocaine abuse ([place])  Cannabis dependence with current use [facility])  Other orders -     Drug Profile [phone] -     Panel [phone] -     Cocaine Con, Ur -     Opiates Confirmation, Urine  I have discontinued Felicia Odom. Felicia Odom's HYDROcodone-acetaminophen and HYDROcodone-acetaminophen. I am also having her maintain her ibuprofen, Narcan, rosuvastatin, ALPRAZolam, multivitamin, albuterol, esomeprazole, and albuterol.  Meds ordered this encounter  Medications   DISCONTD: HYDROcodone-acetaminophen (NORCO) 10-325 MG tablet    Sig: Take 1 tablet by mouth every 4 (four) hours as needed (for pain).    Dispense:  90 tablet    Refill:  0     Follow-up: Return in about 3 months (around 06/23/2021).  Scarlette Calico, MD

## 2021-03-24 NOTE — Patient Instructions (Signed)
Degenerative Disk Disease °Degenerative disk disease is a condition caused by changes that occur in the spinal disks as a person ages. Spinal disks are soft and compressible disks located between the bones of your spine (vertebrae). These disks act like shock absorbers. °Degenerative disk disease can affect the whole spine. However, the neck and lower back are most often affected. Many changes can occur in the spinal disks with aging, such as: °The spinal disks may dry and shrink. °Small tears may occur in the tough, outer covering of the disk (annulus). °The disk space may become smaller due to loss of water. °Abnormal growths in the bone (spurs) may occur. This can put pressure on the nerve roots exiting the spinal canal, causing pain. °The spinal canal may become narrowed. °What are the causes? °This condition may be caused by: °Normal degeneration with age. °Injuries. °Certain activities and sports that cause damage. °What increases the risk? °The following factors may make you more likely to develop this condition: °Being overweight. °Having a family history of degenerative disk disease. °Smoking and use of products that contain nicotine and tobacco. °Sudden injury. °Doing work that requires heavy lifting. °What are the signs or symptoms? °Symptoms of this condition include: °Pain that varies in intensity. Some people have no pain, while others have severe pain. The location of the pain depends on the part of your backbone that is affected. You may have: °Pain in your neck or arm if a disk in your neck area is affected. °Pain in your back, buttocks, or legs if a disk in your lower back is affected. °Pain that becomes worse while bending or reaching up, or with twisting movements. °Pain that may start gradually and worsen as time passes. It may also start after a major or minor injury. °Numbness or tingling in the arms or legs. °How is this diagnosed? °This condition may be diagnosed based on: °Your symptoms and  medical history. °A physical exam. °Imaging tests, including: °X-ray of the spine. °CT scan. °MRI. °How is this treated? °This condition may be treated with: °Medicines. °Injection of steroids into the back. °Rehabilitation exercises. These activities aim to strengthen muscles in your back and abdomen to better support your spine. °If treatments do not help to relieve your symptoms or you have severe pain, you may need surgery. °Follow these instructions at home: °Medicines °Take over-the-counter and prescription medicines only as told by your health care provider. °Ask your health care provider if the medicine prescribed to you: °Requires you to avoid driving or using machinery. °Can cause constipation. You may need to take these actions to prevent or treat constipation: °Drink enough fluid to keep your urine pale yellow. °Take over-the-counter or prescription medicines. °Eat foods that are high in fiber, such as beans, whole grains, and fresh fruits and vegetables. °Limit foods that are high in fat and processed sugars, such as fried or sweet foods. °Activity °Rest as told by your health care provider. °Avoid sitting for a long time without moving. Get up to take short walks every 1-2 hours. This is important to improve blood flow and breathing. Ask for help if you feel weak or unsteady. °Return to your normal activities as told by your health care provider. Ask your health care provider what activities are safe for you. °Perform relaxation exercises as told by your health care provider. °Maintain good posture. °Do not lift anything that is heavier than 10 lb (4.5 kg), or the limit that you are told, until your health care   provider says that it is safe. °Follow proper lifting and walking techniques as told by your health care provider. °Managing pain, stiffness, and swelling °  °If directed, put ice on the painful area. Icing can help to relieve pain. To do this: °Put ice in a plastic bag. °Place a towel between  your skin and the bag. °Leave the ice on for 20 minutes, 2-3 times a day. °Remove the ice if your skin turns bright red. This is very important. If you cannot feel pain, heat, or cold, you have a greater risk of damage to the area. °If directed, apply heat to the painful area as often as told by your health care provider. Heat can reduce the stiffness of your muscles. Use the heat source that your health care provider recommends, such as a moist heat pack or a heating pad. °Place a towel between your skin and the heat source. °Leave the heat on for 20-30 minutes. °Remove the heat if your skin turns bright red. This is especially important if you are unable to feel pain, heat, or cold. You may have a greater risk of getting burned. °General instructions °Change your sitting, standing, and sleeping habits as told by your health care provider. °Avoid sitting in the same position for long periods of time. Change positions frequently. °Lose weight or maintain a healthy weight as told by your health care provider. °Do not use any products that contain nicotine or tobacco, such as cigarettes, e-cigarettes, and chewing tobacco. If you need help quitting, ask your health care provider. °Wear supportive footwear. °Keep all follow-up visits. This is important. This may include visits for physical therapy. °Contact a health care provider if you: °Have pain that does not go away within 1-4 weeks. °Lose your appetite. °Lose weight without trying. °Get help right away if you: °Have severe pain. °Notice weakness in your arms, hands, or legs. °Begin to lose control of your bladder or bowel movements. °Have fevers or night sweats. °Summary °Degenerative disk disease is a condition caused by changes that occur in the spinal disks as a person ages. °This condition can affect the whole spine. However, the neck and lower back are most often affected. °Take over-the-counter and prescription medicines only as told by your health care  provider. °This information is not intended to replace advice given to you by your health care provider. Make sure you discuss any questions you have with your health care provider. °Document Revised: 10/24/2019 Document Reviewed: 10/24/2019 °Elsevier Patient Education © 2022 [employer] ° °

## 2021-03-31 LAB — URINE DRUGS OF ABUSE SCREEN W ALC, ROUTINE (REF LAB)
Amphetamines, Urine: NEGATIVE ng/mL
Barbiturate Quant, Ur: NEGATIVE ng/mL
Ethanol, Urine: NEGATIVE %
Methadone Screen, Urine: NEGATIVE ng/mL
PCP Quant, Ur: NEGATIVE ng/mL
Propoxyphene: NEGATIVE ng/mL

## 2021-03-31 LAB — PANEL 799049
CARBOXY THC GC/MS CONF: 130 ng/mL
Cannabinoid GC/MS, Ur: POSITIVE — AB

## 2021-03-31 LAB — COCAINE CONF, UR
Benzoylecgonine GC/MS Conf: 290 ng/mL
Cocaine Metab Quant, Ur: POSITIVE — AB

## 2021-03-31 LAB — OPIATES CONFIRMATION, URINE: OPIATES: NEGATIVE

## 2021-03-31 LAB — DRUG PROFILE 799031: BENZODIAZEPINES: NEGATIVE

## 2021-04-05 DIAGNOSIS — F1410 Cocaine abuse, uncomplicated: Secondary | ICD-10-CM | POA: Insufficient documentation

## 2021-04-05 DIAGNOSIS — Z79891 Long term (current) use of opiate analgesic: Secondary | ICD-10-CM

## 2021-04-05 HISTORY — DX: Long term (current) use of opiate analgesic: Z79.891

## 2021-04-05 HISTORY — DX: Cocaine abuse, uncomplicated: F14.10

## 2021-04-12 DIAGNOSIS — J449 Chronic obstructive pulmonary disease, unspecified: Secondary | ICD-10-CM | POA: Diagnosis not present

## 2021-04-13 ENCOUNTER — Encounter: Payer: Self-pay | Admitting: Physical Medicine & Rehabilitation

## 2021-04-15 DIAGNOSIS — Z1231 Encounter for screening mammogram for malignant neoplasm of breast: Secondary | ICD-10-CM | POA: Diagnosis not present

## 2021-04-21 NOTE — Progress Notes (Signed)
Patient ID: Felicia Odom, female    DOB: 12-12-1955, 65 y.o.   MRN: [id number]  HPI F former smoker followed for COPD, Allergic rhinitis, complicated by Thymoma/ resected, anxiety Office spirometry- 01/29/15 Normal spirometry. FVC 2.74/81%, FEV1 2.11/79%, FEV1/FVC 0.77, FEF 25-75 percent 1.79 Office Spirometry 10/21/2015-slight restriction of exhaled volume, mild obstruction. FVC 2.69/78%, FEV1 2.04/76%, FEV1/FVC 0.76, FEF 25-75 percent 1.68/65% PFT 04/27/2016-minimal obstructive airways disease, minimal diffusion defect, insignificant response to bronchodilator. FVC 2.79/78%, FEV1 2.11/76%, ratio 0.76, FEF 25-75 percent 1.81/72%, TLC 92%, DLCO 76%. .----------------------------------------------------------------------------------------------------   08/12/20- 65 year old female former smoker followed for asthma/bronchitis, allergic rhinitis, complicated by resection Thymoma/ TSGY, OCD/panic, Anxiety/ Depression, GERD, DM2, HTN, Eczema,  -Neb albuterol , Ventolin hfa, Xanax 1 mg Covid vax- 3 Phizer Flu vax- declines -----Doing well, no issues Using rescue hfa 0-2x/ day. No major flares. Does say last few days cough a little worse, sputum green No fever or blood. Asks Zpak. Discussed CT result thyroid nodule with pending [place], and "atherosclerosis". She had put off trying Crestor for fear of myalgia side-effects, saying she wanted to discuss with me first. Has already discussed with cardiology. Always very anxious, easily becomes tearful. Frequent night sweats and nocturia.  CT low dose screen 07/15/20- IMPRESSION: 1. Lung-RADS 2, benign appearance or behavior. Continue annual screening with low-dose chest CT without contrast in 12 months. 2. 2.0 cm low-attenuation right thyroid nodule. Recommend thyroid US (ref: J Am Coll Radiol. 2015 Feb;12(2): 143-50). 3.  Aortic atherosclerosis (ICD10-I70.0). 4.  Emphysema (ICD10-J43.9).  04/22/21-  65 year old female former smoker followed for  Asthma/Bronchitis, allergic Rhinitis, complicated by Resection Thymoma/ TSGY, OCD/panic, Anxiety/ Depression, GERD, DM2, HTN, Eczema, Osteoarthritis,  -Neb albuterol , Ventolin hfa, Xanax 1 mg Covid vax- 4 Phizer Flu vax-declines Dr Ronnald Ramp noted Pos drug screen opiates, cocaine, and also cannabis use. He allowed continuation of xanax. She tearfully denies anything but  occasional cannabis use. Says [facility] told her that "someone must have laced her pot". CT low dose due in December.  She is changing PCP and GYN- insurance Rarely needs her nebulizer machine or albuterol hfa. No exacerbation. Does ask refill xanax, which we have given for years for anxiety/ panic associated with asthma exacerbations in past. No excess sedation or misuse identified.  Chronic localized pain/ tenderness R inner breast border, considered nerve irritation, unchanged since onset at time of R thoracotomy for her thymectomy.  Review of Systems- see HPI + = positive Constitutional:   No-   weight loss, +night sweats, fevers, chills, fatigue, lassitude. HEENT:   No-  headaches, difficulty swallowing, tooth/dental problems, sore throat,       No-  sneezing, itching, ear ache, nasal congestion, post nasal drip,  CV:  + chest pain, No-orthopnea, PND, swelling in lower extremities, anasarca, dizziness, palpitations Resp: + shortness of breath with exertion or at rest.              productive cough,   non-productive cough,  No- coughing up of blood.            change in color of mucus.  No- wheezing.   Skin: No-   rash or lesions. GI:    heartburn, indigestion, No-abdominal pain, nausea, vomiting,  GU:  MS:  + joint pain or swelling.  . Neuro-     nothing unusual Psych: change in mood or affect. +Chronic depression , + anxiety.  No memory loss.   Objective:   Physical Exam General- Alert, Oriented , Distress- none acute, +  Obese. + easily tearful, Skin- rash-none, lesions- none, excoriation- none Lymphadenopathy-  none Head- + healed scar forehead            Eyes- Gross vision intact, PERRLA, conjunctivae clear secretions            Ears- Hearing, canals-normal            Nose- No- rhinorrhea, no-Septal dev, polyps, erosion, perforation             Throat- Mallampati II , mucosa clear , drainage- none, tonsils- atrophic. +Missing teeth Neck- flexible , trachea midline, no stridor , thyroid nl, carotid no bruit Chest - symmetrical excursion , unlabored           Heart/CV- RRR , no murmur , no gallop, no rub, nl s1 s2                           - JVD- none , edema- none, stasis changes- none, varices- none           Lung- wheeze-none, unlabored, cough-none, dullness-none, rub- none,            Chest wall-  +R VATS scars s/p thymectomy. No rub. Abd-  Br/ Gen/ Rectal- Not done, not indicated Extrem-  Neuro- grossly intact to observation

## 2021-04-22 ENCOUNTER — Other Ambulatory Visit: Payer: Self-pay

## 2021-04-22 ENCOUNTER — Ambulatory Visit (INDEPENDENT_AMBULATORY_CARE_PROVIDER_SITE_OTHER): Payer: Medicare Other | Admitting: Internal Medicine

## 2021-04-22 DIAGNOSIS — R0789 Other chest pain: Secondary | ICD-10-CM

## 2021-04-22 DIAGNOSIS — J449 Chronic obstructive pulmonary disease, unspecified: Secondary | ICD-10-CM | POA: Diagnosis not present

## 2021-04-22 DIAGNOSIS — G8929 Other chronic pain: Secondary | ICD-10-CM

## 2021-04-22 DIAGNOSIS — F410 Panic disorder [episodic paroxysmal anxiety] without agoraphobia: Secondary | ICD-10-CM

## 2021-04-22 MED ORDER — ALPRAZOLAM 1 MG PO TABS: 1.0000 mg | ORAL_TABLET | Freq: Four times a day (QID) | ORAL | 3 refills | Status: DC | PRN

## 2021-04-22 NOTE — Patient Instructions (Signed)
Alprazolam refilled  Please call if we can help

## 2021-04-23 ENCOUNTER — Encounter: Payer: Self-pay | Admitting: Internal Medicine

## 2021-04-23 NOTE — Assessment & Plan Note (Signed)
Uncomplicated. Discussed her occasional recreational pot use as adverse to her respiratory status.She is mostly upset about accusation of cocaine use, which she denies.  She has her meds. Plan- continue albuterol, Ventolin

## 2021-04-23 NOTE — Assessment & Plan Note (Signed)
What she describes is small area of persistent mild pain/ tenderness along medial border of left breast that has been present since Thymoma surgery w/o change or palpable mass and appears to be nerve branch irritation perhaps from scar.  Plan- she will discuss w gyn

## 2021-04-23 NOTE — Assessment & Plan Note (Signed)
Alprazolam refilled with discussion

## 2021-04-26 ENCOUNTER — Ambulatory Visit: Payer: Medicare Other | Admitting: Internal Medicine

## 2021-04-29 ENCOUNTER — Ambulatory Visit: Payer: Medicare Other | Admitting: Internal Medicine

## 2021-05-12 ENCOUNTER — Other Ambulatory Visit: Payer: Self-pay | Admitting: Obstetrics and Gynecology

## 2021-05-12 DIAGNOSIS — L918 Other hypertrophic disorders of the skin: Secondary | ICD-10-CM | POA: Diagnosis not present

## 2021-05-12 DIAGNOSIS — L820 Inflamed seborrheic keratosis: Secondary | ICD-10-CM | POA: Diagnosis not present

## 2021-05-27 ENCOUNTER — Telehealth: Payer: Self-pay

## 2021-05-27 NOTE — Progress Notes (Signed)
    Chronic Care Management Pharmacy Assistant   Name: Felicia Odom  MRN: [id number] DOB: 14-Jan-1956   Reason for Encounter: Disease State   Conditions to be addressed/monitored: General   Recent office visits:  03/24/21 [name], MD-PCP (Hypertension) orders: labs Medication changes:discontinued Hydrocodone  Recent consult visits:  04/22/21 Baird Lyons D, MD-Pulmonary Disease (COPD mixed type) no orders or med changes  Hospital visits:  None in previous 6 months  Medications: Outpatient Encounter Medications as of 05/27/2021  Medication Sig   albuterol (PROVENTIL) (2.5 MG/3ML) 0.083% nebulizer solution USE 1 VIAL IN NEBULIZER EVERY 6 HOURS - And As Needed   albuterol (VENTOLIN HFA) 108 (90 Base) MCG/ACT inhaler INHALE 1 TO 2 PUFFS INTO THE LUNGS EVERY 4 HOURS AS NEEDED FOR WHEEZING OR SHORTNESS OF BREATH   ALPRAZolam (XANAX) 1 MG tablet Take 1 tablet (1 mg total) by mouth every 6 (six) hours as needed.   esomeprazole (NEXIUM) 40 MG capsule TAKE 1 CAPSULE BY MOUTH EVERY DAY   ibuprofen (ADVIL) 800 MG tablet TAKE 1 TABLET BY MOUTH EVERY 8 HOURS AS NEEDED   Multiple Vitamin (MULTIVITAMIN) tablet Take 1 tablet by mouth daily.   rosuvastatin (CRESTOR) 20 MG tablet Take 1 tablet (20 mg total) by mouth daily. (Patient not taking: Reported on 04/22/2021)   [DISCONTINUED] progesterone (PROMETRIUM) 100 MG capsule Take 100 mg by mouth daily.   No facility-administered encounter medications on file as of 05/27/2021.   Have you had any problems recently with your health? Patient states that she is not having any new health issues.   Have you had any problems with your pharmacy? Patient states she does not have any problems getting her medications or the cost of medications from the pharmacy  What issues or side effects are you having with your medications?Patient is not having any side effects to any medications  What would you like me to pass along to [facility] for  them to help you with? Patient stated that she will no longer have Dr. Ronnald Ramp as her PCP. Patient states that she has not had any problems with being off of the hydrocodone  What can we do to take care of you better? Not at this time  Care Gaps: Colonoscopy-NA Diabetic Foot Exam-01/30/20 Mammogram-02/06/20 Ophthalmology-01/01/19 Dexa Scan - NA Annual Well Visit - NA Micro albumin-10/28/20 Hemoglobin A1c- 03/24/21  Star Rating Drugs: Rosuvastatin 20 mg-last fill 03/18/21 90 ds  Ethelene Hal Clinical Pharmacist Assistant [phone]

## 2021-05-31 DIAGNOSIS — J449 Chronic obstructive pulmonary disease, unspecified: Secondary | ICD-10-CM | POA: Diagnosis not present

## 2021-06-04 DIAGNOSIS — R350 Frequency of micturition: Secondary | ICD-10-CM | POA: Diagnosis not present

## 2021-06-06 ENCOUNTER — Other Ambulatory Visit: Payer: Self-pay | Admitting: Internal Medicine

## 2021-06-06 DIAGNOSIS — E785 Hyperlipidemia, unspecified: Secondary | ICD-10-CM

## 2021-06-08 ENCOUNTER — Ambulatory Visit: Payer: Medicare Other | Admitting: Physical Medicine & Rehabilitation

## 2021-06-09 ENCOUNTER — Ambulatory Visit: Payer: Medicare Other | Admitting: Nurse Practitioner

## 2021-06-14 DIAGNOSIS — E785 Hyperlipidemia, unspecified: Secondary | ICD-10-CM | POA: Diagnosis not present

## 2021-06-15 DIAGNOSIS — Z8719 Personal history of other diseases of the digestive system: Secondary | ICD-10-CM | POA: Diagnosis not present

## 2021-06-15 DIAGNOSIS — I38 Endocarditis, valve unspecified: Secondary | ICD-10-CM | POA: Diagnosis not present

## 2021-06-15 DIAGNOSIS — E559 Vitamin D deficiency, unspecified: Secondary | ICD-10-CM | POA: Diagnosis not present

## 2021-06-15 DIAGNOSIS — Z87891 Personal history of nicotine dependence: Secondary | ICD-10-CM | POA: Diagnosis not present

## 2021-06-15 DIAGNOSIS — G894 Chronic pain syndrome: Secondary | ICD-10-CM | POA: Diagnosis not present

## 2021-06-15 DIAGNOSIS — K219 Gastro-esophageal reflux disease without esophagitis: Secondary | ICD-10-CM | POA: Diagnosis not present

## 2021-06-15 DIAGNOSIS — Z87898 Personal history of other specified conditions: Secondary | ICD-10-CM | POA: Diagnosis not present

## 2021-06-15 DIAGNOSIS — Z79899 Other long term (current) drug therapy: Secondary | ICD-10-CM | POA: Diagnosis not present

## 2021-06-15 DIAGNOSIS — J449 Chronic obstructive pulmonary disease, unspecified: Secondary | ICD-10-CM | POA: Diagnosis not present

## 2021-06-15 LAB — HEPATIC FUNCTION PANEL
ALT: 17 IU/L (ref 0–32)
AST: 14 IU/L (ref 0–40)
Albumin: 4.2 g/dL (ref 3.8–4.8)
Alkaline Phosphatase: 75 IU/L (ref 44–121)
Bilirubin Total: 0.4 mg/dL (ref 0.0–1.2)
Bilirubin, Direct: 0.11 mg/dL (ref 0.00–0.40)
Total Protein: 6.9 g/dL (ref 6.0–8.5)

## 2021-06-15 LAB — LIPID PANEL
Chol/HDL Ratio: 4.5 ratio — ABNORMAL HIGH (ref 0.0–4.4)
Cholesterol, Total: 206 mg/dL — ABNORMAL HIGH (ref 100–199)
HDL: 46 mg/dL (ref >39)
LDL Chol Calc (NIH): 124 mg/dL — ABNORMAL HIGH (ref 0–99)
Triglycerides: 206 mg/dL — ABNORMAL HIGH (ref 0–149)
VLDL Cholesterol Cal: 36 mg/dL (ref 5–40)
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
## 2021-06-21 ENCOUNTER — Other Ambulatory Visit: Payer: Self-pay

## 2021-06-21 ENCOUNTER — Ambulatory Visit (INDEPENDENT_AMBULATORY_CARE_PROVIDER_SITE_OTHER): Payer: Medicare Other | Admitting: Physician Assistant

## 2021-06-21 ENCOUNTER — Encounter: Payer: Self-pay | Admitting: Physician Assistant

## 2021-06-21 VITALS — BP 124/72 | HR 63 | Ht 66.5 in | Wt 190.6 lb

## 2021-06-21 DIAGNOSIS — E559 Vitamin D deficiency, unspecified: Secondary | ICD-10-CM | POA: Diagnosis not present

## 2021-06-21 DIAGNOSIS — Z87891 Personal history of nicotine dependence: Secondary | ICD-10-CM | POA: Diagnosis not present

## 2021-06-21 DIAGNOSIS — E785 Hyperlipidemia, unspecified: Secondary | ICD-10-CM

## 2021-06-21 DIAGNOSIS — Z87898 Personal history of other specified conditions: Secondary | ICD-10-CM | POA: Diagnosis not present

## 2021-06-21 DIAGNOSIS — I351 Nonrheumatic aortic (valve) insufficiency: Secondary | ICD-10-CM

## 2021-06-21 DIAGNOSIS — I700 Atherosclerosis of aorta: Secondary | ICD-10-CM | POA: Diagnosis not present

## 2021-06-21 DIAGNOSIS — G894 Chronic pain syndrome: Secondary | ICD-10-CM | POA: Diagnosis not present

## 2021-06-21 DIAGNOSIS — Z8719 Personal history of other diseases of the digestive system: Secondary | ICD-10-CM | POA: Diagnosis not present

## 2021-06-21 DIAGNOSIS — R002 Palpitations: Secondary | ICD-10-CM | POA: Diagnosis not present

## 2021-06-21 DIAGNOSIS — K219 Gastro-esophageal reflux disease without esophagitis: Secondary | ICD-10-CM | POA: Diagnosis not present

## 2021-06-21 DIAGNOSIS — I38 Endocarditis, valve unspecified: Secondary | ICD-10-CM | POA: Diagnosis not present

## 2021-06-21 DIAGNOSIS — J449 Chronic obstructive pulmonary disease, unspecified: Secondary | ICD-10-CM | POA: Diagnosis not present

## 2021-06-21 DIAGNOSIS — Z79899 Other long term (current) drug therapy: Secondary | ICD-10-CM | POA: Diagnosis not present

## 2021-06-21 MED ORDER — ROSUVASTATIN CALCIUM 10 MG PO TABS: 10.0000 mg | ORAL_TABLET | Freq: Every day | ORAL | 3 refills | Status: DC

## 2021-06-21 NOTE — Patient Instructions (Signed)
Medication Instructions:  Start Crestor 10 mg ( 1 Tablet Daily). *If you need a refill on your cardiac medications before your next appointment, please call your pharmacy*   Lab Work: Lipid Panel, Hepatic Panel (6 months). If you have labs (blood work) drawn today and your tests are completely normal, you will receive your results only by: [place] (if you have MyChart) OR A paper copy in the mail If you have any lab test that is abnormal or we need to change your treatment, we will call you to review the results.   Testing/Procedures: No Testing   Follow-Up: At [facility], you and your health needs are our priority.  As part of our continuing mission to provide you with exceptional heart care, we have created designated Provider Care Teams.  These Care Teams include your primary Cardiologist (physician) and Advanced Practice Providers (APPs -  Physician Assistants and Nurse Practitioners) who all work together to provide you with the care you need, when you need it.  We recommend signing up for the patient portal called "MyChart".  Sign up information is provided on this After Visit Summary.  MyChart is used to connect with patients for Virtual Visits (Telemedicine).  Patients are able to view lab/test results, encounter notes, upcoming appointments, etc.  Non-urgent messages can be sent to your provider as well.   To learn more about what you can do with MyChart, go to [email or web address].    Your next appointment:   6 month(s)  The format for your next appointment:   In Person  Provider:   Caron Presume, PA-C    Then, Kirk Ruths, MD will plan to see you again in 1 year(s).

## 2021-06-23 ENCOUNTER — Encounter: Payer: Self-pay | Admitting: Nurse Practitioner

## 2021-06-23 ENCOUNTER — Ambulatory Visit: Payer: Medicare Other | Admitting: Internal Medicine

## 2021-06-23 ENCOUNTER — Other Ambulatory Visit (INDEPENDENT_AMBULATORY_CARE_PROVIDER_SITE_OTHER): Payer: Medicare Other

## 2021-06-23 ENCOUNTER — Ambulatory Visit (INDEPENDENT_AMBULATORY_CARE_PROVIDER_SITE_OTHER): Payer: Medicare Other | Admitting: Nurse Practitioner

## 2021-06-23 VITALS — BP 114/62 | HR 67 | Ht 66.5 in | Wt 190.0 lb

## 2021-06-23 DIAGNOSIS — R1031 Right lower quadrant pain: Secondary | ICD-10-CM

## 2021-06-23 DIAGNOSIS — K219 Gastro-esophageal reflux disease without esophagitis: Secondary | ICD-10-CM

## 2021-06-23 LAB — CBC WITH DIFFERENTIAL/PLATELET
Basophils Absolute: 0.1 10*3/uL (ref 0.0–0.1)
Basophils Relative: 1.2 % (ref 0.0–3.0)
Eosinophils Absolute: 0.2 10*3/uL (ref 0.0–0.7)
Eosinophils Relative: 3.1 % (ref 0.0–5.0)
HCT: 42.1 % (ref 36.0–46.0)
Hemoglobin: 14.1 g/dL (ref 12.0–15.0)
Lymphocytes Relative: 35.4 % (ref 12.0–46.0)
Lymphs Abs: 2.2 10*3/uL (ref 0.7–4.0)
MCHC: 33.5 g/dL (ref 30.0–36.0)
MCV: 87.5 fl (ref 78.0–100.0)
Monocytes Absolute: 0.6 10*3/uL (ref 0.1–1.0)
Monocytes Relative: 10.1 % (ref 3.0–12.0)
Neutro Abs: 3.1 10*3/uL (ref 1.4–7.7)
Neutrophils Relative %: 50.2 % (ref 43.0–77.0)
Platelets: 254 10*3/uL (ref 150.0–400.0)
RBC: 4.81 Mil/uL (ref 3.87–5.11)
RDW: 14 % (ref 11.5–15.5)
WBC: 6.2 10*3/uL (ref 4.0–10.5)

## 2021-06-23 LAB — BASIC METABOLIC PANEL
BUN: 8 mg/dL (ref 6–23)
CO2: 30 mEq/L (ref 19–32)
Calcium: 9 mg/dL (ref 8.4–10.5)
Chloride: 103 mEq/L (ref 96–112)
Creatinine, Ser: 0.73 mg/dL (ref 0.40–1.20)
GFR: 86.75 mL/min (ref >60.00)
Glucose, Bld: 86 mg/dL (ref 70–99)
Potassium: 4 mEq/L (ref 3.5–5.1)
Sodium: 139 mEq/L (ref 135–145)

## 2021-06-23 LAB — C-REACTIVE PROTEIN: CRP: <1 mg/dL (ref 0.5–20.0)

## 2021-06-23 MED ORDER — ESOMEPRAZOLE MAGNESIUM 40 MG PO CPDR: DELAYED_RELEASE_CAPSULE | ORAL | 3 refills | Status: DC

## 2021-06-23 NOTE — Progress Notes (Signed)
06/23/2021 Felicia Odom [id number] Oct 19, 1955   Chief Complaint: RLQ pain   History of Present Illness: Felicia Edling. Odom is a 65 year old female with a past medical history of anxiety, asthma, COPD, GERD, H. Pylori 08/2019 and colon polyps. She was last seen in office by Dr. Carlean Purl on 08/08/2019 due to having upper abdominal pain, bloat, diarrhea and night sweats. She underwent an EGD and colonoscopy were done 09/19/2019. The EGD identified H. Pylori gastritis which was treated with Omeprazole/Pepto bismol, Metronidazole and Doxycycline x 14 days. Post treatment H. Pylori stool antigen test 11/08/2019 was negative. The colonoscopy identified a 103mm sessile serrated polyp removed from the transverse colon and diverticulosis to the sigmoid colon. She was advised to schedule a colonoscopy in 3 years.   She presents to our office today for further evaluation regarding RLQ pain which started 2 months ago which has progressively worsened and occurs daily. No fever. She previously passed loose stools but after she took an antibiotic after her gynecologist removed some perineal tags she started passing normal solid stools daily. She denies feeling constipated, no rectal bleeding or black stools. She has occasional night sweats. No fever. No weight loss. No recent GERD symptoms or dysphagia.   EGD 09/19/2019: - No endoscopic esophageal abnormality to explain patient's dysphagia. Esophagus dilated. Dilated. - Erosive gastropathy with no stigmata of recent bleeding. Biopsied. - Duodenitis. Biopsied. - The examination was otherwise normal.  Colonoscopy 09/19/2019: - One 10 mm polyp in the transverse colon, removed with a cold snare and removed piecemeal using a hot snare. Resected and retrieved. - Diverticulosis in the sigmoid colon. - The examined portion of the ileum was normal. - The examination was otherwise normal on direct and retroflexion views. - 3 year colonoscopy  recall Pathology report: 1. Surgical [P], gastric antrum - ANTRAL MUCOSA WITH MILDLY ACTIVE CHRONIC HELICOBACTER PYLORI GASTRITIS AND FOCAL INTESTINAL METAPLASIA. - WARTHIN-STARRY POSITIVE FOR HELICOBACTER PYLORI. - NO DYSPLASIA OR CARCINOMA. 2. Surgical [P], duodenal bulb - DUODENAL MUCOSA WITH BRUNNER GLAND HYPERPLASIA AND CHANGES CONSISTENT WITH PEPTIC INJURY. - NO FEATURES OF SPRUE, DYSPLASIA OR CARCINOMA. P 3. Surgical [P], random sites - COLONIC MUCOSA WITH NO SIGNIFICANT HISTOPATHOLOGIC CHANGES. - NO MICROSCOPIC COLITIS, ACTIVE INFLAMMATION OR CHRONIC CHANGES. 4. Surgical [P], colon, transverse, polyp - SESSILE SERRATED POLYP(S) WITHOUT CYTOLOGIC DYSPLASIA. - NO EVIDENCE OF CARCINOMA.   ECHO 06/30/2020:LV EF 60 - 65%.   Past Medical History:  Diagnosis Date   Anxiety    Arthritis    neck   Asthma    Chronic back pain    Chronic bronchitis (HCC)    Chronic neck pain    COPD (chronic obstructive pulmonary disease) (HCC)    Advair and Albuterol as needed as well as neb   Diverticulosis    GERD (gastroesophageal reflux disease)    takes Nexium daily   Helicobacter pylori gastritis 08/2019   Tx and eradicated   History of blood transfusion    in the 70's. No abnormal reaction noted   History of colon polyps    benign   Hyperlipidemia    not on any meds bc refused to pick up from pharmacy   Joint pain    Joint swelling    Mediastinal tumor    Nocturia    OCD (obsessive compulsive disorder)    Panic disorder    takes Xanax daily as needed   Pneumonia    2016   PONV (postoperative nausea and vomiting)  Seizures ([place])    in the 80's   Past Surgical History:  Procedure Laterality Date   COLONOSCOPY     ECTOPIC PREGNANCY SURGERY     x 2    ESOPHAGOGASTRODUODENOSCOPY     LAPAROSCOPIC LYSIS INTESTINAL ADHESIONS     x 2   RESECTION OF MEDIASTINAL MASS N/A 06/10/2016   Procedure: RESECTION OF MEDIASTINAL MASS;  Surgeon: Grace Isaac, MD;  Location: [facility];   Service: Thoracic;  Laterality: N/A;   VIDEO ASSISTED THORACOSCOPY Right 06/10/2016   Procedure: VIDEO ASSISTED THORACOSCOPY WITH PLACEMENT OF ON Q TUNNELER;  Surgeon: Grace Isaac, MD;  Location: [facility];  Service: Thoracic;  Laterality: Right;   VIDEO BRONCHOSCOPY N/A 06/10/2016   Procedure: VIDEO BRONCHOSCOPY;  Surgeon: Grace Isaac, MD;  Location[facility];  Service: Thoracic;  Laterality: N/A;   Current Outpatient Medications on File Prior to Visit  Medication Sig Dispense Refill   acetaminophen (TYLENOL) 160 MG/5ML liquid Take 500 mg by mouth every 6 (six) hours as needed for pain. Take Every 6 Hours as Needed for Pain     albuterol (PROVENTIL) (2.5 MG/3ML) 0.083% nebulizer solution USE 1 VIAL IN NEBULIZER EVERY 6 HOURS - And As Needed 120 mL 11   albuterol (VENTOLIN HFA) 108 (90 Base) MCG/ACT inhaler INHALE 1 TO 2 PUFFS INTO THE LUNGS EVERY 4 HOURS AS NEEDED FOR WHEEZING OR SHORTNESS OF BREATH 8.5 each 5   ALPRAZolam (XANAX) 1 MG tablet Take 1 tablet (1 mg total) by mouth every 6 (six) hours as needed. 120 tablet 3   Multiple Vitamin (MULTIVITAMIN) tablet Take 1 tablet by mouth daily.     rosuvastatin (CRESTOR) 10 MG tablet Take 1 tablet (10 mg total) by mouth daily. 90 tablet 3   [DISCONTINUED] progesterone (PROMETRIUM) 100 MG capsule Take 100 mg by mouth daily.     No current facility-administered medications on file prior to visit.   Allergies  Allergen Reactions   No Known Allergies     Current Medications, Allergies, Past Medical History, Past Surgical History, Family History and Social History were reviewed in [employer] record.  Review of Systems:   Constitutional: + Night sweats. Negative for fever, chills or weight loss.  Respiratory: Negative for shortness of breath.   Cardiovascular: Negative for chest pain, palpitations and leg swelling.  Gastrointestinal: See HPI.  Musculoskeletal: Negative for back pain or muscle aches.  Neurological:  Negative for dizziness, headaches or paresthesias.   Physical Exam: BP 114/62   Pulse 67   Ht 5' 6.5" (1.689 m)   Wt 190 lb (86.2 kg)   BMI 30.21 kg/m   General: 65 year old female in NAD.  Head: Normocephalic and atraumatic. Eyes: No scleral icterus. Conjunctiva pink . Ears: Normal auditory acuity. Mouth: Upper and lower partial plates.  No ulcers or lesions.  Lungs: Clear throughout to auscultation. Heart: Regular rate and rhythm, no murmur. Abdomen: Soft, nondistended. RLQ and central lower abdominal tenderness. No masses or hepatomegaly. Normal bowel sounds x 4 quadrants.  Rectal: Deferred.  Musculoskeletal: Symmetrical with no gross deformities. Extremities: No edema. Neurological: Alert oriented x 4. No focal deficits.  Psychological: Alert and cooperative. Normal mood and affect  Assessment and Recommendations:  72) 65 year old female with RLQ pain x 2 months  -BMP, CBC, CRP -CTAP with oral and IV contrast  -Ibgard 1 po bid  -Gax X one tab po bid -Further recommendations to be determined after the above results reviewed  2) GERD, history of H. Pylori -Continue Esomeprazole 40mg  QD  3) History of a 72mm sessile serrated colon polyp per colonoscopy 08/2019 -Next colonoscopy due 08/2022

## 2021-06-23 NOTE — Progress Notes (Signed)
RADIOLOGY SCHEDULING REQUEST FOR CT Abd pelvic with contrast [facility] Scheduling via secure staff message.

## 2021-06-23 NOTE — Patient Instructions (Signed)
IMAGING: You will be contacted by [facility] Scheduling (Your caller ID will indicate phone # [phone]) in the next 7 days to schedule your Abdominal/Pelvic CT Scan. If you have not heard from them within 7 business days, please call [place] at [phone] to follow up on the status of your appointment.    LABS:  Lab work has been ordered for you today. Our lab is located in the basement. Press "B" on the elevator. The lab is located at the first door on the left as you exit the elevator.  HEALTHCARE LAWS AND MY CHART RESULTS: Due to recent changes in healthcare laws, you may see the results of your imaging and laboratory studies on MyChart before your provider has had a chance to review them.   We understand that in some cases there may be results that are confusing or concerning to you. Not all laboratory results come back in the same time frame and the provider may be waiting for multiple results in order to interpret others.  Please give [place] 48 hours in order for your provider to thoroughly review all the results before contacting the office for clarification of your results.   RECOMMENDATIONS: IBgard 1 capsule twice a day as needed for abdominal pain. Gas-X 1 tablet twice a day. Further follow up to be determined after CT scan results are reviewed.  It was great seeing you today! Thank you for entrusting me with your care and choosing [facility]  Noralyn Pick, CRNP  The [facility] providers would like to encourage you to use [facility] to communicate with providers for non-urgent requests or questions.  Due to long hold times on the telephone, sending your provider a message by [facility] may be faster and more efficient way to get a response. Please allow 48 business hours for a response.  Please remember that this is for non-urgent requests/questions.  If you are age 64 or older, your body mass index should be between 23-30. Your  Body mass index is 30.21 kg/m. If this is out of the aforementioned range listed, please consider follow up with your Primary Care Provider.  If you are age 64 or younger, your body mass index should be between 19-25. Your Body mass index is 30.21 kg/m. If this is out of the aformentioned range listed, please consider follow up with your Primary Care Provider.

## 2021-06-24 DIAGNOSIS — J449 Chronic obstructive pulmonary disease, unspecified: Secondary | ICD-10-CM | POA: Diagnosis not present

## 2021-07-01 ENCOUNTER — Other Ambulatory Visit: Payer: Self-pay

## 2021-07-01 ENCOUNTER — Ambulatory Visit (HOSPITAL_COMMUNITY): Payer: Medicare Other | Attending: Cardiology

## 2021-07-01 DIAGNOSIS — I350 Nonrheumatic aortic (valve) stenosis: Secondary | ICD-10-CM | POA: Insufficient documentation

## 2021-07-01 LAB — ECHOCARDIOGRAM COMPLETE
AR max vel: 1.23 cm2
AV Area VTI: 1.13 cm2
AV Area mean vel: 1.19 cm2
AV Mean grad: 10 mmHg
AV Peak grad: 17.3 mmHg
Ao pk vel: 2.08 m/s
Area-P 1/2: 2.31 cm2
P 1/2 time: 605 msec
S' Lateral: 3 cm

## 2021-07-06 ENCOUNTER — Other Ambulatory Visit: Payer: Self-pay

## 2021-07-06 ENCOUNTER — Other Ambulatory Visit: Payer: Self-pay | Admitting: Internal Medicine

## 2021-07-06 ENCOUNTER — Ambulatory Visit (HOSPITAL_COMMUNITY)
Admission: RE | Admit: 2021-07-06 | Discharge: 2021-07-06 | Disposition: A | Discharge status: Home / Self Care | Payer: Medicare Other | Source: Ambulatory Visit | Attending: Nurse Practitioner | Admitting: Nurse Practitioner

## 2021-07-06 DIAGNOSIS — R1031 Right lower quadrant pain: Secondary | ICD-10-CM | POA: Diagnosis not present

## 2021-07-06 MED ORDER — IOHEXOL 350 MG/ML SOLN
80.0000 mL | Freq: Once | INTRAVENOUS | Status: AC | PRN
  Administered 2021-07-06: 80 mL via INTRAVENOUS

## 2021-07-06 MED ORDER — SODIUM CHLORIDE (PF) 0.9 % IJ SOLN
INTRAMUSCULAR | Status: AC
  Filled 2021-07-06: qty 50

## 2021-07-08 ENCOUNTER — Other Ambulatory Visit: Payer: Self-pay

## 2021-07-08 ENCOUNTER — Telehealth (INDEPENDENT_AMBULATORY_CARE_PROVIDER_SITE_OTHER): Payer: Medicare Other | Admitting: Nurse Practitioner

## 2021-07-08 ENCOUNTER — Encounter: Payer: Self-pay | Admitting: Nurse Practitioner

## 2021-07-08 DIAGNOSIS — R918 Other nonspecific abnormal finding of lung field: Secondary | ICD-10-CM

## 2021-07-08 DIAGNOSIS — J449 Chronic obstructive pulmonary disease, unspecified: Secondary | ICD-10-CM

## 2021-07-08 DIAGNOSIS — J069 Acute upper respiratory infection, unspecified: Secondary | ICD-10-CM

## 2021-07-08 DIAGNOSIS — R911 Solitary pulmonary nodule: Secondary | ICD-10-CM | POA: Insufficient documentation

## 2021-07-08 DIAGNOSIS — Z87891 Personal history of nicotine dependence: Secondary | ICD-10-CM

## 2021-07-08 HISTORY — DX: Solitary pulmonary nodule: R91.1

## 2021-07-08 HISTORY — DX: Acute upper respiratory infection, unspecified: J06.9

## 2021-07-08 MED ORDER — FLUTICASONE PROPIONATE 50 MCG/ACT NA SUSP: 1.0000 | Freq: Every day | NASAL | 2 refills | Status: DC

## 2021-07-08 MED ORDER — PREDNISONE 20 MG PO TABS: 20.0000 mg | ORAL_TABLET | Freq: Every day | ORAL | 0 refills | Status: AC

## 2021-07-08 NOTE — Assessment & Plan Note (Signed)
Improving. Supportive care measures. No abx therapy indicated at this time. Advised COVID and flu testing. Notify of results.   Patient Instructions  -Continue albuterol inhaler 1-2 puffs or 3 mL neb every 4-6 hours as needed for shortness of breath or wheezing -Flonase nasal spray 1-2 sprays each nostril daily for nasal congestion and rhinorrhea -Prednisone 20 mg daily for 5 days. Take in AM with food.   -Saline nasal spray 2-3 times a day as needed for nasal congestion/postnasal drip until symptoms improve -Mucinex 600 mg twice daily as needed until symptoms improve -Chlortab 4mg  over the counter at night as needed for cough   Low dose CT scan for yearly lung cancer screening.Schedule for December or early January 2023.   Follow up with Dr. Annamaria Boots as scheduled. If symptoms do not improve or worsen, please contact office for sooner follow up or seek emergency care.

## 2021-07-08 NOTE — Assessment & Plan Note (Signed)
Breathing stable. Mild flare with occasional wheezing - prednisone 20 mg for 5 days. Notify if SOB or worsening cough/symptoms occurs. See above plan.

## 2021-07-08 NOTE — Assessment & Plan Note (Signed)
Yearly LDCT for lung cancer screening. See above.

## 2021-07-08 NOTE — Progress Notes (Signed)
Patient ID: Felicia Odom, female     DOB: Sep 25, 1955, 65 y.o.      MRN: [id number]  Chief Complaint  Patient presents with   Acute Visit    Virtual Visit via Video Note  I connected with Felicia Odom on 07/08/21 at 11:00 AM EST by a video enabled telemedicine application and verified that I am speaking with the correct person using two identifiers.  Location: Patient: Home Provider: Office   I discussed the limitations of evaluation and management by telemedicine and the availability of in person appointments. The patient expressed understanding and agreed to proceed.  History of Present Illness: 65 year old female, former smoker (36 pack years) followed for COPD with asthma, pulmonary nodules, and allergic rhinitis.  She is a patient of Dr. Janee Morn and was last seen in office on 04/23/2019.  Past medical history significant for hypertension, GERD, DM2, anxiety/depression, OCD, panic disorder, eczema, substance abuse, s/p thymectomy.   04/22/2021: OV with Dr. Annamaria Boots. Pulm status stable. POC drug screen by Dr. Ronnald Ramp positive for cannabis, cocaine, and opiates. No maintenance inhaler therapy. Continued PRN albuterol. Refilled alprazolam. LDCT chest for follow up ordered.  07/08/2021: Today - acute sick visit Patient presents today for congestion and cough. Her symptoms started approx 3-4 days ago. Her symptoms have started to improve but she reports continued rhinorrhea that is now clear. It was previously green in color. She was experiencing occasional chills but never developed any fevers. Her cough is non-productive and unchanged from baseline. She reports intermittent episodes of wheezing primarily in the AM, relieved by rescue inhaler. She denies shortness of breath, PND, orthopnea, chest pain, or lower extremity swelling. She has been taking mucinex over the counter with moderate relief. She does have flonase at home but has not used it at this time. Overall, she's starting  to feel better and offers no further complaints.   Allergies  Allergen Reactions   No Known Allergies    Immunization History  Administered Date(s) Administered   Influenza-Unspecified 03/26/2015   PFIZER(Purple Top)SARS-COV-2 Vaccination 09/28/2019, 10/19/2019, 05/19/2020   Tdap 09/23/2015   Zoster Recombinat (Shingrix) 03/27/2018, 06/08/2018   Past Medical History:  Diagnosis Date   Anxiety    Arthritis    neck   Asthma    Chronic back pain    Chronic bronchitis (HCC)    Chronic neck pain    COPD (chronic obstructive pulmonary disease) (HCC)    Advair and Albuterol as needed as well as neb   Diverticulosis    GERD (gastroesophageal reflux disease)    takes Nexium daily   Helicobacter pylori gastritis 08/2019   Tx and eradicated   History of blood transfusion    in the 70's. No abnormal reaction noted   History of colon polyps    benign   Hyperlipidemia    not on any meds bc refused to pick up from pharmacy   Joint pain    Joint swelling    Mediastinal tumor    Nocturia    OCD (obsessive compulsive disorder)    Panic disorder    takes Xanax daily as needed   Pneumonia    2016   PONV (postoperative nausea and vomiting)    Seizures ([place])    in the 80's    Tobacco History: Social History   Tobacco Use  Smoking Status Former   Packs/day: 1.00   Years: 36.00   Pack years: 36.00   Types: Cigarettes   Quit date: 2017   Years  since quitting: 5.9  Smokeless Tobacco Never  Tobacco Comments   last cigarette 2017   Counseling given: Not Answered Tobacco comments: last cigarette 2017   Outpatient Medications Prior to Visit  Medication Sig Dispense Refill   acetaminophen (TYLENOL) 160 MG/5ML liquid Take 500 mg by mouth every 6 (six) hours as needed for pain. Take Every 6 Hours as Needed for Pain     albuterol (PROVENTIL) (2.5 MG/3ML) 0.083% nebulizer solution USE 1 VIAL IN NEBULIZER EVERY 6 HOURS - And As Needed 120 mL 11   albuterol (VENTOLIN HFA) 108 (90  Base) MCG/ACT inhaler INHALE 1 TO 2 PUFFS INTO THE LUNGS EVERY 4 HOURS AS NEEDED FOR WHEEZING OR SHORTNESS OF BREATH 8.5 each 5   ALPRAZolam (XANAX) 1 MG tablet Take 1 tablet (1 mg total) by mouth every 6 (six) hours as needed. 120 tablet 3   esomeprazole (NEXIUM) 40 MG capsule TAKE 1 CAPSULE BY MOUTH EVERY DAY 90 capsule 3   Multiple Vitamin (MULTIVITAMIN) tablet Take 1 tablet by mouth daily.     rosuvastatin (CRESTOR) 10 MG tablet Take 1 tablet (10 mg total) by mouth daily. 90 tablet 3   Vitamin D, Ergocalciferol, (DRISDOL) 1.25 MG (50000 UNIT) CAPS capsule Take 50,000 Units by mouth once a week.     fluocinonide (LIDEX) 0.05 % external solution  (Patient not taking: Reported on 07/08/2021)     No facility-administered medications prior to visit.     Review of Systems:   Constitutional: No weight loss or gain, night sweats, fevers, fatigue. +chills (resolved) HEENT: No headaches, difficulty swallowing, tooth/dental problems, or sore throat. No sneezing, itching, ear ache. +nasal congestion, rhinorrhea (now clear) CV:  No chest pain, orthopnea, PND, swelling in lower extremities, anasarca, dizziness, palpitations, syncope Resp: +non-productive cough; intermittent wheezing relieved by rescue inhaler. No shortness of breath with exertion or at rest. No excess mucus or change in color of mucus.  No hemoptysis. No chest wall deformity GI:  No heartburn, indigestion, abdominal pain, nausea, vomiting, diarrhea, change in bowel habits, loss of appetite, bloody stools.  GU: No dysuria, change in color of urine, urgency or frequency.  No flank pain, no hematuria  Skin: No rash, lesions, ulcerations MSK:  No joint pain or swelling.  No decreased range of motion.  No back pain. Neuro: No dizziness or lightheadedness.  Psych: No depression or anxiety. Mood stable.   Observations/Objective: Interactive, pleasant. A&Ox3. Well-appearing. In no acute distress. Breathing unlabored.   07/15/2020 LDCT Chest  Lung ca Screening:  Atherosclerosis.  2 cm low-attenuation lesion of the right lobe of the thyroid.  No lymphadenopathy.  Mild centrilobular emphysema.  Scattered mild pulmonary parenchymal scarring.  Stable peribronchovascular nodularity in the left lower lobe, likely postinfectious in etiology.  1 mm apical left upper lobe nodule.  No new pulmonary nodules.  Airways unremarkable.  Lung RADS 2, benign appearance of behavior.  Repeat CT chest in 12 months. Thyroid US recommended.  08/17/2020 Thyroid US: No significant interval change of bilateral thyroid nodules.  07/01/2021 echocardiogram:  Normal LV function with EF 60 to 65%.  G1 DD.  Mild AS and MR.  No change compared to previous.  Assessment and Plan: URI (upper respiratory infection) Improving. Supportive care measures. No abx therapy indicated at this time. Advised COVID and flu testing. Notify of results.   Patient Instructions  -Continue albuterol inhaler 1-2 puffs or 3 mL neb every 4-6 hours as needed for shortness of breath or wheezing -Flonase nasal spray 1-2 sprays  each nostril daily for nasal congestion and rhinorrhea -Prednisone 20 mg daily for 5 days. Take in AM with food.   -Saline nasal spray 2-3 times a day as needed for nasal congestion/postnasal drip until symptoms improve -Mucinex 600 mg twice daily as needed until symptoms improve -Chlortab 4mg  over the counter at night as needed for cough   Low dose CT scan for yearly lung cancer screening.Schedule for December or early January 2023.   Follow up with Dr. Annamaria Boots as scheduled. If symptoms do not improve or worsen, please contact office for sooner follow up or seek emergency care.    COPD mixed type ([place]) Breathing stable. Mild flare with occasional wheezing - prednisone 20 mg for 5 days. Notify if SOB or worsening cough/symptoms occurs. See above plan.  Former tobacco use Yearly LDCT for lung cancer screening. See above.    Follow Up Instructions: Follow up as  scheduled with Dr. Annamaria Boots. If symptoms do not improve or worsen, please contact office for sooner follow up or seek emergency care.    I discussed the assessment and treatment plan with the patient. The patient was provided an opportunity to ask questions and all were answered. The patient agreed with the plan and demonstrated an understanding of the instructions.   The patient was advised to call back or seek an in-person evaluation if the symptoms worsen or if the condition fails to improve as anticipated.  I provided 20 minutes of non-face-to-face time during this encounter.   Clayton Bibles, NP

## 2021-07-08 NOTE — Patient Instructions (Addendum)
-  Continue albuterol inhaler 1-2 puffs or 3 mL neb every 4-6 hours as needed for shortness of breath or wheezing -Flonase nasal spray 1-2 sprays each nostril daily for nasal congestion and rhinorrhea -Prednisone 20 mg daily for 5 days. Take in AM with food.   -Saline nasal spray 2-3 times a day as needed for nasal congestion/postnasal drip until symptoms improve -Mucinex 600 mg twice daily as needed until symptoms improve -Chlortab 4mg  over the counter at night as needed for cough   Low dose CT scan for yearly lung cancer screening.Schedule for December or early January 2023.   Follow up with Dr. Annamaria Boots as scheduled. If symptoms do not improve or worsen, please contact office for sooner follow up or seek emergency care.

## 2021-07-09 ENCOUNTER — Telehealth: Payer: Medicare Other

## 2021-07-09 DIAGNOSIS — E785 Hyperlipidemia, unspecified: Secondary | ICD-10-CM | POA: Diagnosis not present

## 2021-07-09 DIAGNOSIS — Z9181 History of falling: Secondary | ICD-10-CM | POA: Diagnosis not present

## 2021-07-09 DIAGNOSIS — Z0000 Encounter for general adult medical examination without abnormal findings: Secondary | ICD-10-CM | POA: Diagnosis not present

## 2021-07-15 DIAGNOSIS — M545 Low back pain, unspecified: Secondary | ICD-10-CM | POA: Diagnosis not present

## 2021-07-15 DIAGNOSIS — R300 Dysuria: Secondary | ICD-10-CM | POA: Diagnosis not present

## 2021-07-23 ENCOUNTER — Other Ambulatory Visit: Payer: Self-pay | Admitting: Acute Care

## 2021-07-23 DIAGNOSIS — Z87891 Personal history of nicotine dependence: Secondary | ICD-10-CM

## 2021-07-29 DIAGNOSIS — L03115 Cellulitis of right lower limb: Secondary | ICD-10-CM | POA: Diagnosis not present

## 2021-07-29 DIAGNOSIS — L0889 Other specified local infections of the skin and subcutaneous tissue: Secondary | ICD-10-CM | POA: Diagnosis not present

## 2021-08-01 ENCOUNTER — Encounter (HOSPITAL_COMMUNITY): Payer: Self-pay

## 2021-08-01 ENCOUNTER — Other Ambulatory Visit: Payer: Self-pay

## 2021-08-01 ENCOUNTER — Emergency Department (HOSPITAL_COMMUNITY)
Admission: EM | Admit: 2021-08-01 | Discharge: 2021-08-01 | Disposition: A | Discharge status: Home / Self Care | Payer: Medicare Other | Attending: Emergency Medicine | Admitting: Emergency Medicine

## 2021-08-01 DIAGNOSIS — I499 Cardiac arrhythmia, unspecified: Secondary | ICD-10-CM | POA: Diagnosis not present

## 2021-08-01 DIAGNOSIS — M549 Dorsalgia, unspecified: Secondary | ICD-10-CM | POA: Diagnosis not present

## 2021-08-01 DIAGNOSIS — R002 Palpitations: Secondary | ICD-10-CM | POA: Diagnosis not present

## 2021-08-01 DIAGNOSIS — R0789 Other chest pain: Secondary | ICD-10-CM | POA: Diagnosis not present

## 2021-08-01 DIAGNOSIS — R6889 Other general symptoms and signs: Secondary | ICD-10-CM | POA: Diagnosis not present

## 2021-08-01 DIAGNOSIS — R000 Tachycardia, unspecified: Secondary | ICD-10-CM | POA: Insufficient documentation

## 2021-08-01 DIAGNOSIS — Z743 Need for continuous supervision: Secondary | ICD-10-CM | POA: Diagnosis not present

## 2021-08-01 DIAGNOSIS — R079 Chest pain, unspecified: Secondary | ICD-10-CM | POA: Diagnosis not present

## 2021-08-01 LAB — BASIC METABOLIC PANEL
Anion gap: 7 (ref 5–15)
BUN: 12 mg/dL (ref 8–23)
CO2: 25 mmol/L (ref 22–32)
Calcium: 8.9 mg/dL (ref 8.9–10.3)
Chloride: 106 mmol/L (ref 98–111)
Creatinine, Ser: 0.87 mg/dL (ref 0.44–1.00)
GFR, Estimated: >60 mL/min (ref >60)
Glucose, Bld: 131 mg/dL — ABNORMAL HIGH (ref 70–99)
Potassium: 3.5 mmol/L (ref 3.5–5.1)
Sodium: 138 mmol/L (ref 135–145)

## 2021-08-01 LAB — CBC
HCT: 46.1 % — ABNORMAL HIGH (ref 36.0–46.0)
Hemoglobin: 15.8 g/dL — ABNORMAL HIGH (ref 12.0–15.0)
MCH: 30 pg (ref 26.0–34.0)
MCHC: 34.3 g/dL (ref 30.0–36.0)
MCV: 87.5 fL (ref 80.0–100.0)
Platelets: 280 10*3/uL (ref 150–400)
RBC: 5.27 MIL/uL — ABNORMAL HIGH (ref 3.87–5.11)
RDW: 13.2 % (ref 11.5–15.5)
WBC: 7.3 10*3/uL (ref 4.0–10.5)
nRBC: 0 % (ref 0.0–0.2)

## 2021-08-01 LAB — MAGNESIUM: Magnesium: 1.9 mg/dL (ref 1.7–2.4)

## 2021-08-01 NOTE — ED Provider Notes (Signed)
[facility] EMERGENCY DEPARTMENT Provider Note   CSN: [id number] Arrival date & time: 08/01/21  1705     History  Chief Complaint  Patient presents with   Palpitations    Felicia Odom is a 66 y.o. female.  HPI Patient presents with EMS.  History is obtained by those individuals and the patient.  She presents due to an episode of chest pain that improved prior to my evaluation.  She suspects a history of A. fib, though has no confirmed diagnosis of this.  Just prior to calling EMS she noticed tachycardia, tightness in her chest.  Primis the patient received Cardizem, 10 mg, heart rate went from 160s to 140s and improved again prior to evaluation. No confusion, disorientation, the patient denies dyspnea.  She does have ongoing back issues, and is scheduled for evaluation next week.     Home Medications Prior to Admission medications   Medication Sig Start Date End Date Taking? Authorizing Provider  albuterol (PROVENTIL) (2.5 MG/3ML) 0.083% nebulizer solution USE 1 VIAL IN NEBULIZER EVERY 6 HOURS - And As Needed Patient taking differently: Take 2.5 mg by nebulization every 4 (four) hours as needed for shortness of breath. 03/06/21  Yes Young, Tarri Fuller D, MD  albuterol (VENTOLIN HFA) 108 (90 Base) MCG/ACT inhaler INHALE 1 TO 2 PUFFS INTO THE LUNGS EVERY 4 HOURS AS NEEDED FOR WHEEZING OR SHORTNESS OF BREATH Patient taking differently: 1 puff every 6 (six) hours as needed for shortness of breath. 07/07/21  Yes Young, Tarri Fuller D, MD  ALPRAZolam Duanne Moron) 1 MG tablet Take 1 tablet (1 mg total) by mouth every 6 (six) hours as needed. Patient taking differently: Take 1 mg by mouth every 6 (six) hours as needed for anxiety. 04/22/21  Yes Baird Lyons D, MD  aspirin 81 MG chewable tablet Chew 81 mg by mouth daily as needed for moderate pain.   Yes [provider]  doxycycline (VIBRAMYCIN) 100 MG capsule Take 100 mg by mouth 2 (two) times daily. 07/29/21  Yes [provider]  esomeprazole (NEXIUM) 40 MG capsule TAKE 1 CAPSULE BY MOUTH EVERY DAY Patient taking differently: 40 mg daily at 12 noon. 06/23/21  Yes Noralyn Pick, NP  fluticasone (FLONASE) 50 MCG/ACT nasal spray Place 1 spray into both nostrils daily. 07/08/21  Yes Cobb, Karie Schwalbe, NP  Multiple Vitamin (MULTIVITAMIN) tablet Take 1 tablet by mouth daily.   Yes [provider]  mupirocin ointment (BACTROBAN) 2 % Apply 1 application topically 2 (two) times daily. 07/29/21  Yes [provider]  rosuvastatin (CRESTOR) 10 MG tablet Take 1 tablet (10 mg total) by mouth daily. Patient not taking: Reported on 08/01/2021 06/21/21 09/19/21  Warren Lacy, PA-C  Vitamin D, Ergocalciferol, (DRISDOL) 1.25 MG (50000 UNIT) CAPS capsule Take 50,000 Units by mouth once a week. 06/21/21   [provider]  progesterone (PROMETRIUM) 100 MG capsule Take 100 mg by mouth daily.  10/06/11  [provider]      Allergies    No known allergies    Review of Systems   Review of Systems  Constitutional:        Per HPI, otherwise negative  HENT:         Per HPI, otherwise negative  Respiratory:         Per HPI, otherwise negative  Cardiovascular:        Per HPI, otherwise negative  Gastrointestinal:  Negative for vomiting.  Endocrine:  Negative aside from HPI  Genitourinary:        Neg aside from HPI   Musculoskeletal:        Per HPI, otherwise negative  Skin: Negative.   Neurological:  Negative for syncope.   Physical Exam Updated Vital Signs BP 115/68    Pulse 62    Temp 98.4 F (36.9 C) (Oral)    Resp 19    Ht 5\' 2"  (1.575 m)    Wt 83.5 kg    SpO2 98%    BMI 33.65 kg/m  Physical Exam Vitals and nursing note reviewed.  Constitutional:      General: She is not in acute distress.    Appearance: She is well-developed.  HENT:     Head: Normocephalic and atraumatic.  Eyes:     Conjunctiva/sclera: Conjunctivae normal.  Cardiovascular:     Rate  and Rhythm: Regular rhythm. Tachycardia present.  Pulmonary:     Effort: Pulmonary effort is normal. No respiratory distress.     Breath sounds: Normal breath sounds. No stridor.  Abdominal:     General: There is no distension.  Skin:    General: Skin is warm and dry.  Neurological:     Mental Status: She is alert and oriented to person, place, and time.     Cranial Nerves: No cranial nerve deficit.    ED Results / Procedures / Treatments   Labs (all labs ordered are listed, but only abnormal results are displayed) Labs Reviewed  BASIC METABOLIC PANEL - Abnormal; Notable for the following components:      Result Value   Glucose, Bld 131 (*)    All other components within normal limits  CBC - Abnormal; Notable for the following components:   RBC 5.27 (*)    Hemoglobin 15.8 (*)    HCT 46.1 (*)    All other components within normal limits  MAGNESIUM    EKG EKG Interpretation  Date/Time:  Sunday August 01 2021 17:21:31 EST Ventricular Rate:  68 PR Interval:  129 QRS Duration: 99 QT Interval:  409 QTC Calculation: 435 R Axis:   -15 Text Interpretation: Sinus rhythm Borderline left axis deviation Low voltage, precordial leads Anteroseptal infarct, old Abnormal ECG Confirmed by Carmin Muskrat [phone]) on 08/01/2021 5:47:58 PM  Radiology No results found.  Procedures Procedures    Medications Ordered in ED Medications - No data to display  ED Course/ Medical Decision Making/ A&P Heart rate monitor 70 sinus normal Pulse ox 100% room air normal     I reviewed the patient's EMS rhythm strips, notable for inconsistent sinus rhythm, possible A. fib atrial rhythm, with a rate 130s, wander, artifact.  8:02 PM Patient in sinus rhythm, rate 70s on monitor, no distress, no ongoing complaints.  She, her husband and I discussed all findings.  Findings generally reassuring, no substantial electrolyte abnormalities and absent chest pain, ischemic changes on EKG, low suspicion  for atypical ACS. Suspicion for prior dysrhythmia persists.  CHA2DS2-VASc score is 1, however, and absent conclusive evidence, initiation of anticoagulant considered, but will be delayed until she speaks with her cardiologist in the coming days.  No other evidence for concurrent disease such as pneumonia with clear lungs, no increased work of breathing, no fever.                         Medical Decision Making Problems Addressed: Palpitations: acute illness or injury that poses a threat to life or bodily functions  Details: consideration of afib or other dysrhythmia  Amount and/or Complexity of Data Reviewed Independent Historian: spouse and EMS External Data Reviewed: ECG. Labs: ordered. Decision-making details documented in ED Course. ECG/medicine tests: ordered and independent interpretation performed. Decision-making details documented in ED Course.  Risk Decision regarding hospitalization.  Critical Care Total time providing critical care: < 30 minutes        Final Clinical Impression(s) / ED Diagnoses Final diagnoses:  Palpitations    Rx / DC Orders ED Discharge Orders          Ordered    Amb referral to AFIB Clinic        08/01/21 1725              Carmin Muskrat, MD 08/01/21 2006

## 2021-08-01 NOTE — ED Notes (Signed)
ED Provider at bedside. 

## 2021-08-01 NOTE — Discharge Instructions (Signed)
Today's evaluation has been generally reassuring.  In the emergency department there is no evidence for atrial fibrillation but there is some suspicion that this occurred earlier in the day on reviewing the EMS report.  Given this consideration it is very important that he follow-up with our A. fib clinic or with Dr. Stanford Breed for further discussion.  Return here for concerning changes in your condition.

## 2021-08-01 NOTE — ED Triage Notes (Signed)
Pt c/o chest pain for past hour , states unofficial diagnosis of a-fib, accompanied by weakness, diaphoresis, upon arrival chest pain resolved, has chronic back pain/issues.  PIV 18 g left AC placed by ems, given aspirin 324mg , cardizem 10mg  IV, IV fluid 3100ml.

## 2021-08-02 ENCOUNTER — Encounter: Payer: Self-pay | Admitting: Nurse Practitioner

## 2021-08-02 ENCOUNTER — Ambulatory Visit (INDEPENDENT_AMBULATORY_CARE_PROVIDER_SITE_OTHER): Payer: Commercial Managed Care - HMO | Admitting: Nurse Practitioner

## 2021-08-02 ENCOUNTER — Ambulatory Visit (INDEPENDENT_AMBULATORY_CARE_PROVIDER_SITE_OTHER): Payer: Medicare Other

## 2021-08-02 ENCOUNTER — Telehealth: Payer: Self-pay | Admitting: *Deleted

## 2021-08-02 VITALS — BP 130/80 | HR 79 | Temp 98.0°F | Ht 66.5 in | Wt 183.0 lb

## 2021-08-02 DIAGNOSIS — R0789 Other chest pain: Secondary | ICD-10-CM

## 2021-08-02 DIAGNOSIS — R911 Solitary pulmonary nodule: Secondary | ICD-10-CM | POA: Diagnosis not present

## 2021-08-02 DIAGNOSIS — J449 Chronic obstructive pulmonary disease, unspecified: Secondary | ICD-10-CM

## 2021-08-02 DIAGNOSIS — R0602 Shortness of breath: Secondary | ICD-10-CM | POA: Diagnosis not present

## 2021-08-02 DIAGNOSIS — N644 Mastodynia: Secondary | ICD-10-CM | POA: Insufficient documentation

## 2021-08-02 DIAGNOSIS — R0781 Pleurodynia: Secondary | ICD-10-CM

## 2021-08-02 DIAGNOSIS — M62838 Other muscle spasm: Secondary | ICD-10-CM | POA: Diagnosis not present

## 2021-08-02 HISTORY — DX: Other muscle spasm: M62.838

## 2021-08-02 MED ORDER — PREDNISONE 10 MG PO TABS: 20.0000 mg | ORAL_TABLET | Freq: Every day | ORAL | 0 refills | Status: AC

## 2021-08-02 MED ORDER — METHOCARBAMOL 500 MG PO TABS: 500.0000 mg | ORAL_TABLET | Freq: Three times a day (TID) | ORAL | 0 refills | Status: DC | PRN

## 2021-08-02 MED ORDER — ALPRAZOLAM 1 MG PO TABS: 1.0000 mg | ORAL_TABLET | Freq: Four times a day (QID) | ORAL | 5 refills | Status: DC | PRN

## 2021-08-02 MED ORDER — BUDESONIDE-FORMOTEROL FUMARATE 80-4.5 MCG/ACT IN AERO: 2.0000 | INHALATION_SPRAY | Freq: Two times a day (BID) | RESPIRATORY_TRACT | 3 refills | Status: DC

## 2021-08-02 NOTE — Assessment & Plan Note (Signed)
Breast vs pleuritic pain vs atypical chest pain. R/o ACS at ED yesterday - EKG with SR. Referred back to cardiology outpatient. Given pain in nipple, advised to follow up with GYN for further evaluation. CXR today to r/o underlying pulmonary etiology. Could be inflammation from coughing spells r/t recent URI. Prednisone 20 mg for 5 days. Robaxin. Tx with OTC analgesics.

## 2021-08-02 NOTE — Telephone Encounter (Signed)
Dr. Annamaria Boots, Patient is requesting a refill of her Alprazolam, she has enough to last until the middle of the month.  After that she has no refills.  Please advise.  Thank you.

## 2021-08-02 NOTE — Patient Instructions (Addendum)
-  Continue albuterol inhaler 1-2 puffs or 3 mL neb every 4-6 hours as needed for shortness of breath or wheezing -Continue flonase nasal spray 1-2 sprays each nostril daily for nasal congestion and rhinorrhea  -Start Symbicort 2 puffs Twice daily. Brush tongue and rinse mouth afterwards. This is your maintenance inhaler. You are going to use this twice a day, every day regardless of your symptoms -Prednisone 20 mg for 5 days. Take in AM with food. -Robaxin 500 mg every 8 hours as needed for muscle spasms  -Tylenol 650 mg every 6 hours over the counter as needed for pain -Can use ibuprofen 400 mg every 6 hours over the counter as needed for pain after you finish the prednisone.   Chest x ray today.  Pulmonary function testing scheduled today.   Follow up with your GYN for right breast/nipple pain.  Follow up with cardiology, as scheduled.    Low dose CT scan for yearly lung cancer screening. Scheduled for February 2nd.   Asthma Action Plan in place Rinse mouth after inhaled corticosteroid use.  Avoid triggers, when able.  Notify if worsening breathlessness, cough, mucus production, fatigue, or wheezing occurs.  Maintain up to date vaccinations, including influenza, COVID, and pneumococcal.  Wash your hands often and avoid sick exposures.  Encouraged masking in crowds.  Exercise, as tolerated. Notify if worsening symptoms upon exertion occur.     Follow up with Dr. Annamaria Boots as scheduled. If symptoms do not improve or worsen, please contact office for sooner follow up or seek emergency care.

## 2021-08-02 NOTE — Assessment & Plan Note (Deleted)
Breast vs pleuritic pain. Given pain in nipple, advised to follow up with GYN for further evaluation. CXR today to r/o underlying pulmonary etiology. Tx with OTC analgesics. Follow up previously scheduled with cardiology.

## 2021-08-02 NOTE — Progress Notes (Signed)
Please forward to normal results pool. CXR with clear lungs and no acute cardiopulmonary process identified. Thanks.

## 2021-08-02 NOTE — Assessment & Plan Note (Addendum)
Progressive, slow worsening of SOB over the past few months. Unrelated, per pt, to chest discomfort. Initiate maintenance therapy with Symbicort 2 puffs Twice daily. Continue albuterol PRN. Notify if having to use multiple times a day. PFTs ordered.   Patient Instructions  -Continue albuterol inhaler 1-2 puffs or 3 mL neb every 4-6 hours as needed for shortness of breath or wheezing -Continue flonase nasal spray 1-2 sprays each nostril daily for nasal congestion and rhinorrhea  -Start Symbicort 2 puffs Twice daily. Brush tongue and rinse mouth afterwards. This is your maintenance inhaler. You are going to use this twice a day, every day regardless of your symptoms -Prednisone 20 mg for 5 days. Take in AM with food. -Robaxin 500 mg every 8 hours as needed for muscle spasms  -Tylenol 650 mg every 6 hours over the counter as needed for pain -Can use ibuprofen 400 mg every 6 hours over the counter as needed for pain after you finish the prednisone.   Chest x ray today.  Pulmonary function testing scheduled today.   Follow up with your GYN for right breast/nipple pain.  Follow up with cardiology, as scheduled.    Low dose CT scan for yearly lung cancer screening. Scheduled for February 2nd.   Asthma Action Plan in place Rinse mouth after inhaled corticosteroid use.  Avoid triggers, when able.  Notify if worsening breathlessness, cough, mucus production, fatigue, or wheezing occurs.  Maintain up to date vaccinations, including influenza, COVID, and pneumococcal.  Wash your hands often and avoid sick exposures.  Encouraged masking in crowds.  Exercise, as tolerated. Notify if worsening symptoms upon exertion occur.     Follow up with Dr. Annamaria Boots as scheduled. If symptoms do not improve or worsen, please contact office for sooner follow up or seek emergency care.

## 2021-08-02 NOTE — Assessment & Plan Note (Signed)
Follow up annual surveillance CT chest scheduled 08/26/2021.

## 2021-08-02 NOTE — Telephone Encounter (Signed)
Alprazolam refilled.  

## 2021-08-02 NOTE — Assessment & Plan Note (Signed)
Suspect likely related to coughing spells with recent URI. Robaxin 500 mg every 8 hours. OTC pain control with tylenol. Prednisone 20 mg for 5 days. Can use ibuprofen OTC once prednisone completed. Follow up with PCP if problems persist.

## 2021-08-02 NOTE — Progress Notes (Signed)
@Patient  ID: Felicia Odom, female    DOB: 09-Feb-1956, 66 y.o.   MRN: [id number]  Chief Complaint  Patient presents with   Follow-up    Lung pain, pain in right back at shoulder blade area, burning pain inside right chest and nipple.  Has been this way for a month and worse for the past weeks.  Seen in Ed for A-fib.    Referring provider: Nicholos Johns, MD  HPI: 66 year old female, former smoker (36 pack years) followed for COPD with asthma, pulmonary nodules and allergic rhinitis.  She is a patient of Dr. Janee Morn and was last seen via virtual visit on 07/08/2021 by [facility] NP.  Past medical history significant for hypertension, GERD, DM2, anxiety/depression, OCD, panic disorder, eczema, substance abuse, status post thymectomy.  TEST/EVENTS:  07/15/2020 LDCT chest lung cancer screening: Arthrosclerosis.  2 cm low-attenuation lesion of the right lobe of the thyroid.  No lymphadenopathy.  Mild centrilobular emphysema.  Scattered mild pulmonary parenchymal scarring.  Stable peribronchovascular nodularity in the left lower lobe.  1 mm apical left upper lobe nodule.  No new pulmonary nodules.  Lung RADS 2, benign appearance of behavior. 08/17/2020 thyroid ultrasound: No significant interval change of bilateral thyroid nodules 07/01/2021 echocardiogram: LVEF 60 to 65%.  G1 DD.  Mild AS and MR.  No change compared to previous  04/22/2021: OV with Dr. Annamaria Boots.  Pulm status stable.  POC drug screen by Dr. Ronnald Ramp positive for cannabis, cocaine and opiates.  No maintenance inhaler therapy.  Continue as needed albuterol.  Refilled alprazolam.  LDCT chest for follow-up ordered  07/08/2021: Virtual visit with Mihailo Sage NP.  URI and mild COPD flare.  Treated with prednisone 20 mg for 5 days and supportive care. LDCT planned for late December '22 or January 2023.   08/02/2021: Today - acute visit Patient presents today for reported pain in right breast, nipple and right mid back. She was seen yesterday in the ED for  this pain and ACS r/o. She was scheduled for follow up with cardiology. She reports this began about a month ago in her breat, but the pain in her mid back began a few weeks ago and worsened after her URI, which she reports frequent coughing spells during. Over the past few days, she has noticed an increase in the pain. She describes the breast/nipple pain as burning and the back pain as occasional, sharp/squeezing pains that come and go and make her jump. She denies any recent injuries to the areas. She denies nipple discharge, redness or tenderness. She denies any numbness, tingling or decreased sensation to her extremities. She has been taking tylenol OTC with minimal relief.  She reports unrelated, progressive shortness of breath upon exertion. This has been ongoing for a few months and she has been using her albuterol inhaler a few times a day. Her cough has subsided since having a URI mid December. She denies wheezing, orthopnea, PND, or lower extremity swelling. She is not currently on any maintenance inhalers.   Allergies  Allergen Reactions   No Known Allergies     Immunization History  Administered Date(s) Administered   Influenza-Unspecified 03/26/2015   PFIZER(Purple Top)SARS-COV-2 Vaccination 09/28/2019, 10/19/2019, 05/19/2020   Tdap 09/23/2015   Zoster Recombinat (Shingrix) 03/27/2018, 06/08/2018    Past Medical History:  Diagnosis Date   Anxiety    Arthritis    neck   Asthma    Chronic back pain    Chronic bronchitis (HCC)    Chronic neck pain  COPD (chronic obstructive pulmonary disease) (HCC)    Advair and Albuterol as needed as well as neb   Diverticulosis    GERD (gastroesophageal reflux disease)    takes Nexium daily   Helicobacter pylori gastritis 08/2019   Tx and eradicated   History of blood transfusion    in the 70's. No abnormal reaction noted   History of colon polyps    benign   Hyperlipidemia    not on any meds bc refused to pick up from pharmacy    Joint pain    Joint swelling    Mediastinal tumor    Nocturia    OCD (obsessive compulsive disorder)    Panic disorder    takes Xanax daily as needed   Pneumonia    2016   PONV (postoperative nausea and vomiting)    Seizures ([place])    in the 80's    Tobacco History: Social History   Tobacco Use  Smoking Status Former   Packs/day: 1.00   Years: 36.00   Pack years: 36.00   Types: Cigarettes   Quit date: 2017   Years since quitting: 6.0  Smokeless Tobacco Never  Tobacco Comments   last cigarette 2017   Counseling given: Not Answered Tobacco comments: last cigarette 2017   Outpatient Medications Prior to Visit  Medication Sig Dispense Refill   albuterol (PROVENTIL) (2.5 MG/3ML) 0.083% nebulizer solution USE 1 VIAL IN NEBULIZER EVERY 6 HOURS - And As Needed (Patient taking differently: Take 2.5 mg by nebulization every 4 (four) hours as needed for shortness of breath.) 120 mL 11   albuterol (VENTOLIN HFA) 108 (90 Base) MCG/ACT inhaler INHALE 1 TO 2 PUFFS INTO THE LUNGS EVERY 4 HOURS AS NEEDED FOR WHEEZING OR SHORTNESS OF BREATH (Patient taking differently: 1 puff every 6 (six) hours as needed for shortness of breath.) 8.5 each 5   ALPRAZolam (XANAX) 1 MG tablet Take 1 tablet (1 mg total) by mouth every 6 (six) hours as needed. (Patient taking differently: Take 1 mg by mouth every 6 (six) hours as needed for anxiety.) 120 tablet 3   aspirin 81 MG chewable tablet Chew 81 mg by mouth daily as needed for moderate pain.     doxycycline (VIBRAMYCIN) 100 MG capsule Take 100 mg by mouth 2 (two) times daily.     esomeprazole (NEXIUM) 40 MG capsule TAKE 1 CAPSULE BY MOUTH EVERY DAY (Patient taking differently: 40 mg daily at 12 noon.) 90 capsule 3   Multiple Vitamin (MULTIVITAMIN) tablet Take 1 tablet by mouth daily.     mupirocin ointment (BACTROBAN) 2 % Apply 1 application topically 2 (two) times daily.     rosuvastatin (CRESTOR) 10 MG tablet Take 1 tablet (10 mg total) by mouth daily.  90 tablet 3   Vitamin D, Ergocalciferol, (DRISDOL) 1.25 MG (50000 UNIT) CAPS capsule Take 50,000 Units by mouth once a week.     fluticasone (FLONASE) 50 MCG/ACT nasal spray Place 1 spray into both nostrils daily. (Patient not taking: Reported on 08/02/2021) 16 g 2   No facility-administered medications prior to visit.     Review of Systems:   Constitutional: No weight loss or gain, night sweats, fevers, chills, fatigue, or lassitude. HEENT: No headaches, difficulty swallowing, tooth/dental problems, or sore throat. No sneezing, itching, ear ache, nasal congestion, or post nasal drip CV:  +palpitations, atypical chest pain (following up with cardiology). No orthopnea, PND, swelling in lower extremities, anasarca, dizziness, syncope Resp: +shortness of breath with exertion. No excess mucus  or change in color of mucus. No productive or non-productive. No hemoptysis. No wheezing.  No chest wall deformity GI:  No heartburn, indigestion, abdominal pain, nausea, vomiting, diarrhea, change in bowel habits, loss of appetite, bloody stools.  GU: No dysuria, change in color of urine, urgency or frequency.  No flank pain, no hematuria  Skin: No rash, lesions, ulcerations MSK:  No joint pain or swelling.  No decreased range of motion.  +mid right back pain; burning pain right breast/nipple Neuro: No dizziness or lightheadedness.  Psych: No depression or anxiety. Mood stable.     Physical Exam:  BP 130/80 (BP Location: Left Arm, Patient Position: Sitting, Cuff Size: Normal)    Pulse 79    Temp 98 F (36.7 C) (Oral)    Ht 5' 6.5" (1.689 m)    Wt 183 lb (83 kg)    SpO2 97%    BMI 29.09 kg/m   GEN: Pleasant, interactive, well-nourished; in no acute distress. HEENT:  Normocephalic and atraumatic. EACs patent bilaterally. TM pearly gray with present light reflex bilaterally. PERRLA. Sclera white. Nasal turbinates pink, moist and patent bilaterally. No rhinorrhea present. Oropharynx pink and moist, without  exudate or edema. No lesions, ulcerations, or postnasal drip.  NECK:  Supple w/ fair ROM. No JVD present. Normal carotid impulses w/o bruits. Thyroid symmetrical with no goiter or nodules palpated. No lymphadenopathy.   CV: RRR, no m/r/g, no peripheral edema. Pulses intact, +2 bilaterally. No cyanosis, pallor or clubbing. PULMONARY:  Unlabored, regular breathing. Clear bilaterally A&P w/o wheezes/rales/rhonchi. No accessory muscle use. No dullness to percussion. No tenderness upon palpation to chest wall. GI: BS present and normoactive. Soft, non-tender to palpation. No organomegaly or masses detected. No CVA tenderness. MSK: No erythema, warmth or tenderness. Cap refil <2 sec all extrem. No deformities or joint swelling noted.  Neuro: A/Ox3. No focal deficits noted.   Skin: Warm, no lesions or rashes. No redness or discharge from right breast. Psych: Normal affect and behavior. Judgement and thought content appropriate.     Lab Results:  CBC    Component Value Date/Time   WBC 7.3 08/01/2021 1725   RBC 5.27 (H) 08/01/2021 1725   HGB 15.8 (H) 08/01/2021 1725   HCT 46.1 (H) 08/01/2021 1725   PLT 280 08/01/2021 1725   MCV 87.5 08/01/2021 1725   MCH 30.0 08/01/2021 1725   MCHC 34.3 08/01/2021 1725   RDW 13.2 08/01/2021 1725   LYMPHSABS 2.2 06/23/2021 0906   MONOABS 0.6 06/23/2021 0906   EOSABS 0.2 06/23/2021 0906   BASOSABS 0.1 06/23/2021 0906    BMET    Component Value Date/Time   NA 138 08/01/2021 1725   NA 142 03/16/2018 0000   K 3.5 08/01/2021 1725   CL 106 08/01/2021 1725   CO2 25 08/01/2021 1725   GLUCOSE 131 (H) 08/01/2021 1725   BUN 12 08/01/2021 1725   BUN 9 03/16/2018 0000   CREATININE 0.87 08/01/2021 1725   CALCIUM 8.9 08/01/2021 1725   GFRNONAA >60 08/01/2021 1725   GFRAA >60 06/12/2016 0343    BNP No results found for: BNP   Imaging:  CT ABDOMEN PELVIS W CONTRAST  Result Date: 07/06/2021 CLINICAL DATA:  Right lower quadrant pain for 2 months. EXAM:  CT ABDOMEN AND PELVIS WITH CONTRAST TECHNIQUE: Multidetector CT imaging of the abdomen and pelvis was performed using the standard protocol following bolus administration of intravenous contrast. CONTRAST:  16mL OMNIPAQUE IOHEXOL 350 MG/ML SOLN COMPARISON:  None. FINDINGS: Lower Chest: No  acute findings. Hepatobiliary: No hepatic masses identified. Gallbladder is unremarkable. No evidence of biliary ductal dilatation. Pancreas:  No mass or inflammatory changes. Spleen: Within normal limits in size and appearance. Adrenals/Urinary Tract: No masses identified. No evidence of ureteral calculi or hydronephrosis. Stomach/Bowel: No evidence of obstruction, inflammatory process or abnormal fluid collections. Normal appendix visualized. Vascular/Lymphatic: No pathologically enlarged lymph nodes. No acute vascular findings. Aortic atherosclerotic calcification noted. Reproductive:  No mass or other significant abnormality. Other:  None. Musculoskeletal:  No suspicious bone lesions identified. IMPRESSION: No evidence of appendicitis or other acute findings. Aortic Atherosclerosis (ICD10-I70.0). Electronically Signed   By: Marlaine Hind M.D.   On: 07/06/2021 16:35      PFT Results Latest Ref Rng & Units 04/27/2016  FVC-Pre L 2.70  FVC-Predicted Pre % 75  FVC-Post L 2.79  FVC-Predicted Post % 78  Pre FEV1/FVC % % 74  Post FEV1/FCV % % 76  FEV1-Pre L 2.00  FEV1-Predicted Pre % 72  FEV1-Post L 2.11  DLCO uncorrected ml/min/mmHg 20.70  DLCO UNC% % 76  DLVA Predicted % 104  TLC L 4.97  TLC % Predicted % 92  RV % Predicted % 107    No results found for: NITRICOXIDE      Assessment & Plan:   COPD mixed type (HCC) Progressive, slow worsening of SOB over the past few months. Unrelated, per pt, to chest discomfort. Initiate maintenance therapy with Symbicort 2 puffs Twice daily. Continue albuterol PRN. Notify if having to use multiple times a day. PFTs ordered.   Patient Instructions  -Continue albuterol  inhaler 1-2 puffs or 3 mL neb every 4-6 hours as needed for shortness of breath or wheezing -Continue flonase nasal spray 1-2 sprays each nostril daily for nasal congestion and rhinorrhea  -Start Symbicort 2 puffs Twice daily. Brush tongue and rinse mouth afterwards. This is your maintenance inhaler. You are going to use this twice a day, every day regardless of your symptoms -Prednisone 20 mg for 5 days. Take in AM with food. -Robaxin 500 mg every 8 hours as needed for muscle spasms  -Tylenol 650 mg every 6 hours over the counter as needed for pain -Can use ibuprofen 400 mg every 6 hours over the counter as needed for pain after you finish the prednisone.   Chest x ray today.  Pulmonary function testing scheduled today.   Follow up with your GYN for right breast/nipple pain.  Follow up with cardiology, as scheduled.    Low dose CT scan for yearly lung cancer screening. Scheduled for February 2nd.   Asthma Action Plan in place Rinse mouth after inhaled corticosteroid use.  Avoid triggers, when able.  Notify if worsening breathlessness, cough, mucus production, fatigue, or wheezing occurs.  Maintain up to date vaccinations, including influenza, COVID, and pneumococcal.  Wash your hands often and avoid sick exposures.  Encouraged masking in crowds.  Exercise, as tolerated. Notify if worsening symptoms upon exertion occur.     Follow up with Dr. Annamaria Boots as scheduled. If symptoms do not improve or worsen, please contact office for sooner follow up or seek emergency care.    Muscle spasm Suspect likely related to coughing spells with recent URI. Robaxin 500 mg every 8 hours. OTC pain control with tylenol. Prednisone 20 mg for 5 days. Can use ibuprofen OTC once prednisone completed. Follow up with PCP if problems persist.   Atypical chest pain Breast vs pleuritic pain vs atypical chest pain. R/o ACS at ED yesterday - EKG with SR.  Referred back to cardiology outpatient. Given pain in  nipple, advised to follow up with GYN for further evaluation. CXR today to r/o underlying pulmonary etiology. Could be inflammation from coughing spells r/t recent URI. Prednisone 20 mg for 5 days. Robaxin. Tx with OTC analgesics.   Lung nodule Follow up annual surveillance CT chest scheduled 08/26/2021.    Clayton Bibles, NP 08/02/2021  Pt aware and understands NP's role.

## 2021-08-03 ENCOUNTER — Encounter: Payer: Self-pay | Admitting: Cardiology

## 2021-08-03 ENCOUNTER — Other Ambulatory Visit: Payer: Self-pay

## 2021-08-03 ENCOUNTER — Ambulatory Visit (INDEPENDENT_AMBULATORY_CARE_PROVIDER_SITE_OTHER): Payer: Commercial Managed Care - HMO | Admitting: Cardiology

## 2021-08-03 VITALS — BP 148/84 | HR 90 | Ht 66.5 in | Wt 186.0 lb

## 2021-08-03 DIAGNOSIS — R002 Palpitations: Secondary | ICD-10-CM

## 2021-08-03 DIAGNOSIS — E785 Hyperlipidemia, unspecified: Secondary | ICD-10-CM

## 2021-08-03 DIAGNOSIS — N644 Mastodynia: Secondary | ICD-10-CM | POA: Diagnosis not present

## 2021-08-03 DIAGNOSIS — I480 Paroxysmal atrial fibrillation: Secondary | ICD-10-CM | POA: Diagnosis not present

## 2021-08-03 DIAGNOSIS — I351 Nonrheumatic aortic (valve) insufficiency: Secondary | ICD-10-CM | POA: Diagnosis not present

## 2021-08-03 MED ORDER — APIXABAN 5 MG PO TABS: 5.0000 mg | ORAL_TABLET | Freq: Two times a day (BID) | ORAL | 6 refills | Status: DC

## 2021-08-03 MED ORDER — DILTIAZEM HCL ER COATED BEADS 180 MG PO CP24: 180.0000 mg | ORAL_CAPSULE | Freq: Every day | ORAL | 3 refills | Status: DC

## 2021-08-03 NOTE — Patient Instructions (Addendum)
Medication Instructions:   START DILTIAZEM CD 180 MG ONCE DAILY  STOP ASPIRIN  START ELIQUIS 5 MG ONE TABLET TWICE DAILY  *If you need a refill on your cardiac medications before your next appointment, please call your pharmacy*   Lab Work:  Your physician recommends that you HAVE LAB WORK TODAY  If you have labs (blood work) drawn today and your tests are completely normal, you will receive your results only by: [place] (if you have MyChart) OR A paper copy in the mail If you have any lab test that is abnormal or we need to change your treatment, we will call you to review the results.   Follow-Up: At [facility], you and your health needs are our priority.  As part of our continuing mission to provide you with exceptional heart care, we have created designated Provider Care Teams.  These Care Teams include your primary Cardiologist (physician) and Advanced Practice Providers (APPs -  Physician Assistants and Nurse Practitioners) who all work together to provide you with the care you need, when you need it.  We recommend signing up for the patient portal called "MyChart".  Sign up information is provided on this After Visit Summary.  MyChart is used to connect with patients for Virtual Visits (Telemedicine).  Patients are able to view lab/test results, encounter notes, upcoming appointments, etc.  Non-urgent messages can be sent to your provider as well.   To learn more about what you can do with MyChart, go to [email or web address].    Your next appointment:   3 month(s)  The format for your next appointment:   In Person  Provider:   Coletta Memos NP [place] PA-C  Your physician recommends that you schedule a follow-up appointment in: [place]

## 2021-08-03 NOTE — Progress Notes (Signed)
HPI: FU atrial fibrillation. Nuclear study October 2017 showed ejection fraction 56%, probable shifting breast attenuation, cannot rule out mild apical ischemia. Patient had previous resection of thymoma.  Chest CT December 2018 showed no residual mass. There was note of aortic atherosclerosis. Monitor 1/19 showed sinus rhythm. Most recent echocardiogram December 2022 showed normal LV function, grade 1 diastolic dysfunction, mild mitral regurgitation, mild aortic stenosis with mean gradient 10 mmHg and trivial aortic insufficiency.  Patient seen in the emergency room January 8 with palpitations.  EMS tracings reveal atrial fibrillation.  She converted to sinus rhythm with Cardizem.  Since last seen she did have palpitations at the time of her atrial fibrillation.  She has some dyspnea on exertion but no orthopnea, PND or pedal edema.  She has not had syncope.  She has had a burning in her right breast but no other chest pain.  Current Outpatient Medications  Medication Sig Dispense Refill   albuterol (PROVENTIL) (2.5 MG/3ML) 0.083% nebulizer solution USE 1 VIAL IN NEBULIZER EVERY 6 HOURS - And As Needed (Patient taking differently: Take 2.5 mg by nebulization every 4 (four) hours as needed for shortness of breath.) 120 mL 11   albuterol (VENTOLIN HFA) 108 (90 Base) MCG/ACT inhaler INHALE 1 TO 2 PUFFS INTO THE LUNGS EVERY 4 HOURS AS NEEDED FOR WHEEZING OR SHORTNESS OF BREATH (Patient taking differently: 1 puff every 6 (six) hours as needed for shortness of breath.) 8.5 each 5   ALPRAZolam (XANAX) 1 MG tablet Take 1 tablet (1 mg total) by mouth every 6 (six) hours as needed for anxiety. 120 tablet 5   aspirin 81 MG chewable tablet Chew 81 mg by mouth daily as needed for moderate pain.     budesonide-formoterol (SYMBICORT) 80-4.5 MCG/ACT inhaler Inhale 2 puffs into the lungs 2 (two) times daily. 1 each 3   doxycycline (VIBRAMYCIN) 100 MG capsule Take 100 mg by mouth 2 (two) times daily.      esomeprazole (NEXIUM) 40 MG capsule TAKE 1 CAPSULE BY MOUTH EVERY DAY (Patient taking differently: 40 mg daily at 12 noon.) 90 capsule 3   methocarbamol (ROBAXIN) 500 MG tablet Take 1 tablet (500 mg total) by mouth every 8 (eight) hours as needed for muscle spasms. 40 tablet 0   Multiple Vitamin (MULTIVITAMIN) tablet Take 1 tablet by mouth daily.     mupirocin ointment (BACTROBAN) 2 % Apply 1 application topically 2 (two) times daily.     predniSONE (DELTASONE) 10 MG tablet Take 2 tablets (20 mg total) by mouth daily with breakfast for 5 days. 10 tablet 0   rosuvastatin (CRESTOR) 10 MG tablet Take 1 tablet (10 mg total) by mouth daily. 90 tablet 3   Vitamin D, Ergocalciferol, (DRISDOL) 1.25 MG (50000 UNIT) CAPS capsule Take 50,000 Units by mouth once a week.     No current facility-administered medications for this visit.     Past Medical History:  Diagnosis Date   Anxiety    Arthritis    neck   Asthma    Chronic back pain    Chronic bronchitis (HCC)    Chronic neck pain    COPD (chronic obstructive pulmonary disease) (HCC)    Advair and Albuterol as needed as well as neb   Diverticulosis    GERD (gastroesophageal reflux disease)    takes Nexium daily   Helicobacter pylori gastritis 08/2019   Tx and eradicated   History of blood transfusion    in the 70's. No abnormal  reaction noted   History of colon polyps    benign   Hyperlipidemia    not on any meds bc refused to pick up from pharmacy   Joint pain    Joint swelling    Mediastinal tumor    Nocturia    OCD (obsessive compulsive disorder)    Panic disorder    takes Xanax daily as needed   Pneumonia    2016   PONV (postoperative nausea and vomiting)    Seizures ([place])    in the 80's    Past Surgical History:  Procedure Laterality Date   COLONOSCOPY     ECTOPIC PREGNANCY SURGERY     x 2    ESOPHAGOGASTRODUODENOSCOPY     LAPAROSCOPIC LYSIS INTESTINAL ADHESIONS     x 2   RESECTION OF MEDIASTINAL MASS N/A 06/10/2016    Procedure: RESECTION OF MEDIASTINAL MASS;  Surgeon: Grace Isaac, MD;  Location: [facility];  Service: Thoracic;  Laterality: N/A;   VIDEO ASSISTED THORACOSCOPY Right 06/10/2016   Procedure: VIDEO ASSISTED THORACOSCOPY WITH PLACEMENT OF ON Q TUNNELER;  Surgeon: Grace Isaac, MD;  Location: [facility];  Service: Thoracic;  Laterality: Right;   VIDEO BRONCHOSCOPY N/A 06/10/2016   Procedure: VIDEO BRONCHOSCOPY;  Surgeon: Grace Isaac, MD;  Location: [facility];  Service: Thoracic;  Laterality: N/A;    Social History   Socioeconomic History   Marital status: Divorced    Spouse name: Not on file   Number of children: 1   Years of education: Not on file   Highest education level: Not on file  Occupational History   Occupation: retired  Tobacco Use   Smoking status: Former    Packs/day: 1.00    Years: 36.00    Pack years: 36.00    Types: Cigarettes    Quit date: 2017    Years since quitting: 6.0   Smokeless tobacco: Never   Tobacco comments:    last cigarette 2017  Vaping Use   Vaping Use: Never used  Substance and Sexual Activity   Alcohol use: No    Alcohol/week: 0.0 standard drinks   Drug use: No   Sexual activity: Not on file  Other Topics Concern   Not on file  Social History Narrative   Retired [employer]   Divorced   1 son   Former smoker   2 coffee/daty   No EtOH, no drugs   Social Determinants of Radio broadcast assistant Strain: Not on file  Food Insecurity: Not on file  Transportation Needs: Not on file  Physical Activity: Not on file  Stress: Not on file  Social Connections: Not on file  Intimate Partner Violence: Not on file    Family History  Problem Relation Age of Onset   Heart attack Father    Prostate cancer Father    Heart failure Father    Diabetes Mother    Clotting disorder Mother        PE   Heart failure Mother    Kidney disease Mother    Breast cancer Sister    Colon cancer Sister    Esophageal cancer Neg Hx    Rectal  cancer Neg Hx    Stomach cancer Neg Hx     ROS: no fevers or chills, productive cough, hemoptysis, dysphasia, odynophagia, melena, hematochezia, dysuria, hematuria, rash, seizure activity, orthopnea, PND, pedal edema, claudication. Remaining systems are negative.  Physical Exam: Well-developed well-nourished anxious appearing in no acute distress.  Skin is warm and dry.  HEENT is normal.  Neck is supple.  Chest is clear to auscultation with normal expansion.  Cardiovascular exam is irregular Abdominal exam nontender or distended. No masses palpated. Extremities show no edema.  Right lower extremity wrapped from recent infection. neuro grossly intact  Electrocardiogram today shows sinus tachycardia with frequent PACs and short run of PAT, left axis deviation, nonspecific ST changes.  A/P  1 recently diagnosed atrial fibrillation-we will check TSH.  Recent echocardiogram showed normal LV function.  I will add Cardizem CD 180 mg daily for rate control if atrial fibrillation recurs.  She has embolic risk factors female sex and aortic atherosclerosis; CHA2DS2-VASc 2.  She will also be 66 in March.  I therefore feel that we should discontinue aspirin and instead treat with apixaban 5 mg twice daily.  2 aortic stenosis-patient will need follow-up echoes in the future.  3 hyperlipidemia-given history of aortic atherosclerosis we will continue statin.    4 aortic atherosclerosis-continue statin.  5 obesity-needs continued efforts at weight loss and she is trying to do this on her treadmill.  Kirk Ruths, MD

## 2021-08-04 LAB — TSH: TSH: 1.89 u[IU]/mL (ref 0.450–4.500)

## 2021-08-11 DIAGNOSIS — E559 Vitamin D deficiency, unspecified: Secondary | ICD-10-CM | POA: Diagnosis not present

## 2021-08-11 DIAGNOSIS — Z09 Encounter for follow-up examination after completed treatment for conditions other than malignant neoplasm: Secondary | ICD-10-CM | POA: Diagnosis not present

## 2021-08-11 DIAGNOSIS — L03119 Cellulitis of unspecified part of limb: Secondary | ICD-10-CM | POA: Diagnosis not present

## 2021-08-11 DIAGNOSIS — I4891 Unspecified atrial fibrillation: Secondary | ICD-10-CM | POA: Diagnosis not present

## 2021-08-11 NOTE — Telephone Encounter (Signed)
Called and spoke with patient, verified that she did receive her alprazolam.   She also followed up with Cardiology and GYN.  She is also seeing pcp about her blood pressure.  Nothing further needed.

## 2021-08-16 DIAGNOSIS — J449 Chronic obstructive pulmonary disease, unspecified: Secondary | ICD-10-CM | POA: Diagnosis not present

## 2021-08-19 DIAGNOSIS — L02415 Cutaneous abscess of right lower limb: Secondary | ICD-10-CM | POA: Diagnosis not present

## 2021-08-26 ENCOUNTER — Ambulatory Visit
Admission: RE | Admit: 2021-08-26 | Discharge: 2021-08-26 | Disposition: A | Discharge status: Home / Self Care | Payer: Medicare Other | Source: Ambulatory Visit | Attending: Acute Care | Admitting: Acute Care

## 2021-08-26 DIAGNOSIS — Z87891 Personal history of nicotine dependence: Secondary | ICD-10-CM

## 2021-08-26 DIAGNOSIS — I700 Atherosclerosis of aorta: Secondary | ICD-10-CM | POA: Diagnosis not present

## 2021-08-26 DIAGNOSIS — J432 Centrilobular emphysema: Secondary | ICD-10-CM | POA: Diagnosis not present

## 2021-08-30 ENCOUNTER — Other Ambulatory Visit: Payer: Self-pay

## 2021-08-30 DIAGNOSIS — Z87891 Personal history of nicotine dependence: Secondary | ICD-10-CM

## 2021-08-30 NOTE — Progress Notes (Signed)
Please notify patient there were no suspicious lung nodules identified on her CT scan, which is good news. She had mild centrilobular emphysema and scarring in anterior base of RLL. Atherosclerosis, which is plaque buildup, was an incidental finding, which is common. She is already on statin therapy for this but would f/u with PCP. We will continue with annual lung cancer screening. Thanks!

## 2021-09-04 ENCOUNTER — Other Ambulatory Visit: Payer: Self-pay | Admitting: Nurse Practitioner

## 2021-09-04 DIAGNOSIS — M62838 Other muscle spasm: Secondary | ICD-10-CM

## 2021-09-08 DIAGNOSIS — J449 Chronic obstructive pulmonary disease, unspecified: Secondary | ICD-10-CM | POA: Diagnosis not present

## 2021-09-14 DIAGNOSIS — E785 Hyperlipidemia, unspecified: Secondary | ICD-10-CM | POA: Diagnosis not present

## 2021-09-14 DIAGNOSIS — I38 Endocarditis, valve unspecified: Secondary | ICD-10-CM | POA: Diagnosis not present

## 2021-09-14 DIAGNOSIS — Z87898 Personal history of other specified conditions: Secondary | ICD-10-CM | POA: Diagnosis not present

## 2021-09-14 DIAGNOSIS — G894 Chronic pain syndrome: Secondary | ICD-10-CM | POA: Diagnosis not present

## 2021-09-14 DIAGNOSIS — K219 Gastro-esophageal reflux disease without esophagitis: Secondary | ICD-10-CM | POA: Diagnosis not present

## 2021-09-14 DIAGNOSIS — Z8679 Personal history of other diseases of the circulatory system: Secondary | ICD-10-CM | POA: Diagnosis not present

## 2021-09-14 DIAGNOSIS — Z79899 Other long term (current) drug therapy: Secondary | ICD-10-CM | POA: Diagnosis not present

## 2021-09-14 DIAGNOSIS — J449 Chronic obstructive pulmonary disease, unspecified: Secondary | ICD-10-CM | POA: Diagnosis not present

## 2021-09-14 DIAGNOSIS — Z87891 Personal history of nicotine dependence: Secondary | ICD-10-CM | POA: Diagnosis not present

## 2021-09-14 DIAGNOSIS — E559 Vitamin D deficiency, unspecified: Secondary | ICD-10-CM | POA: Diagnosis not present

## 2021-09-15 ENCOUNTER — Telehealth: Payer: Self-pay | Admitting: Cardiology

## 2021-09-15 MED ORDER — METOPROLOL SUCCINATE ER 25 MG PO TB24: 25.0000 mg | ORAL_TABLET | Freq: Every day | ORAL | 3 refills | Status: DC

## 2021-09-15 NOTE — Telephone Encounter (Signed)
Pt c/o medication issue:  1. Name of Medication: apixaban (ELIQUIS) 5 MG TABS tablet  diltiazem (CARDIZEM CD) 180 MG 24 hr capsule  2. How are you currently taking this medication (dosage and times per day)? Take 1 tablet (5 mg total) by mouth 2 (two) times daily.  Take 1 capsule (180 mg total) by mouth daily.  3. Are you having a reaction (difficulty breathing--STAT)?  no  4. What is your medication issue? Patient stop taking the medication bout 3 days ago. Because she can 11 lbs in a week and the bruises that she was getting. Once she stop taking she begin to feel better. But her pcp said her HR was little on the high side

## 2021-09-15 NOTE — Telephone Encounter (Signed)
Spoke with pt, Aware of dr crenshaw's recommendations. New script sent to the pharmacy  

## 2021-09-15 NOTE — Telephone Encounter (Signed)
Called pt and states that she has stopped the Eliquis and diltiazem because she was having deep calf pain and swelling "all over" she has gained 11# she stopped these medications 3 days ago. She states that it "hurt so bad" she had to use a heating pad at night. She states that since she has stopped the calf pain has went away. Her weight is coming down as well. Informed her that these are not listed as known side effects. She was wanting to have another medication. Also, she states that "a long time ago" she stopped the Crestor because of the calf pain as well. She did not think to call us and let us know about this. Do you want start another cholesterol medication? She would like to know if there is another medication she can take for the Eliquis as well. She does not think that she needs to restart the diltiazem as her BP is normal running 110/70 at PCP and 122/74. She will restart the Eliquis and see how it goes. She will monitor for the side effects again but they have went away for now. Please advise

## 2021-09-23 ENCOUNTER — Telehealth: Payer: Self-pay

## 2021-09-23 NOTE — Chronic Care Management (AMB) (Unsigned)
° ° °  Chronic Care Management Pharmacy Assistant   Name: Felicia Odom  MRN: [id number] DOB: Jan 01, 1956  Felicia Odom is an 66 y.o. year old female who presents for his follow-up CCM visit with the clinical pharmacist.  Reason for Encounter: Disease State-General     Recent office visits:  None ID  Recent consult visits:  08/03/21 Lelon Perla, MD-Cardiology (Palpitations) Blood work ordered, Medication Changes:START DILTIAZEM CD 180 MG ONCE DAILY, START ELIQUIS 5 MG ONE TABLET TWICE DAILY, STOP ASPIRIN   08/02/21 Cobb, Karie Schwalbe, NP (Asthma with COPD) Orders: chest xray, PFT test, Medication changes: -Start Symbicort 2 puffs Twice daily, -Prednisone 20 mg for 5 days, Robaxin 500 mg every 8 hours as needed for muscle spasms   06/23/21 Noralyn Pick, NP-Gastroenterology (Abdominal pain) Labs ordered, Medication Changes: Esomeprazole Magnesium 40 mg  06/21/21 Warren Lacy, PA-C-Internal Medicine (Aortic atherosclerosis) Labs:Lipid Panel, Hepatic Panel, Medication changes: Start Crestor 10 mg ( 1 Tablet Daily).  Hospital visits:  {Hospital DC Yes/No:[id number]}  Medications: Outpatient Encounter Medications as of 09/23/2021  Medication Sig Note   albuterol (PROVENTIL) (2.5 MG/3ML) 0.083% nebulizer solution USE 1 VIAL IN NEBULIZER EVERY 6 HOURS - And As Needed (Patient taking differently: Take 2.5 mg by nebulization every 4 (four) hours as needed for shortness of breath.)    albuterol (VENTOLIN HFA) 108 (90 Base) MCG/ACT inhaler INHALE 1 TO 2 PUFFS INTO THE LUNGS EVERY 4 HOURS AS NEEDED FOR WHEEZING OR SHORTNESS OF BREATH (Patient taking differently: 1 puff every 6 (six) hours as needed for shortness of breath.)    ALPRAZolam (XANAX) 1 MG tablet Take 1 tablet (1 mg total) by mouth every 6 (six) hours as needed for anxiety.    apixaban (ELIQUIS) 5 MG TABS tablet Take 1 tablet (5 mg total) by mouth 2 (two) times daily.    budesonide-formoterol (SYMBICORT)  80-4.5 MCG/ACT inhaler Inhale 2 puffs into the lungs 2 (two) times daily.    doxycycline (VIBRAMYCIN) 100 MG capsule Take 100 mg by mouth 2 (two) times daily. 08/01/2021: Started on 07-30-21   esomeprazole (NEXIUM) 40 MG capsule TAKE 1 CAPSULE BY MOUTH EVERY DAY (Patient taking differently: 40 mg daily at 12 noon.)    methocarbamol (ROBAXIN) 500 MG tablet Take 1 tablet (500 mg total) by mouth every 8 (eight) hours as needed for muscle spasms.    metoprolol succinate (TOPROL XL) 25 MG 24 hr tablet Take 1 tablet (25 mg total) by mouth at bedtime.    Multiple Vitamin (MULTIVITAMIN) tablet Take 1 tablet by mouth daily.    mupirocin ointment (BACTROBAN) 2 % Apply 1 application topically 2 (two) times daily.    Vitamin D, Ergocalciferol, (DRISDOL) 1.25 MG (50000 UNIT) CAPS capsule Take 50,000 Units by mouth once a week. 08/01/2021: Take on Fridays    [DISCONTINUED] progesterone (PROMETRIUM) 100 MG capsule Take 100 mg by mouth daily.    No facility-administered encounter medications on file as of 09/23/2021.    Care Gaps:  Star Rating Drugs:  SIG***

## 2021-09-24 ENCOUNTER — Telehealth: Payer: Self-pay | Admitting: Cardiology

## 2021-09-24 ENCOUNTER — Other Ambulatory Visit: Payer: Self-pay

## 2021-09-24 ENCOUNTER — Observation Stay (HOSPITAL_COMMUNITY)
Admission: EM | Admit: 2021-09-24 | Discharge: 2021-09-26 | Disposition: A | Discharge status: Home / Self Care | Payer: Medicare Other | Attending: Internal Medicine | Admitting: Internal Medicine

## 2021-09-24 ENCOUNTER — Encounter (HOSPITAL_COMMUNITY): Payer: Self-pay

## 2021-09-24 DIAGNOSIS — Z7901 Long term (current) use of anticoagulants: Secondary | ICD-10-CM | POA: Insufficient documentation

## 2021-09-24 DIAGNOSIS — Z20822 Contact with and (suspected) exposure to covid-19: Secondary | ICD-10-CM | POA: Insufficient documentation

## 2021-09-24 DIAGNOSIS — I700 Atherosclerosis of aorta: Secondary | ICD-10-CM | POA: Insufficient documentation

## 2021-09-24 DIAGNOSIS — R1110 Vomiting, unspecified: Secondary | ICD-10-CM | POA: Diagnosis not present

## 2021-09-24 DIAGNOSIS — R6889 Other general symptoms and signs: Secondary | ICD-10-CM | POA: Diagnosis not present

## 2021-09-24 DIAGNOSIS — I480 Paroxysmal atrial fibrillation: Principal | ICD-10-CM | POA: Insufficient documentation

## 2021-09-24 DIAGNOSIS — Z79899 Other long term (current) drug therapy: Secondary | ICD-10-CM | POA: Diagnosis not present

## 2021-09-24 DIAGNOSIS — R079 Chest pain, unspecified: Secondary | ICD-10-CM | POA: Diagnosis not present

## 2021-09-24 DIAGNOSIS — Z743 Need for continuous supervision: Secondary | ICD-10-CM | POA: Diagnosis not present

## 2021-09-24 DIAGNOSIS — Z87891 Personal history of nicotine dependence: Secondary | ICD-10-CM | POA: Insufficient documentation

## 2021-09-24 DIAGNOSIS — Z8529 Personal history of malignant neoplasm of other respiratory and intrathoracic organs: Secondary | ICD-10-CM | POA: Insufficient documentation

## 2021-09-24 DIAGNOSIS — I110 Hypertensive heart disease with heart failure: Secondary | ICD-10-CM | POA: Diagnosis not present

## 2021-09-24 DIAGNOSIS — M79661 Pain in right lower leg: Secondary | ICD-10-CM | POA: Insufficient documentation

## 2021-09-24 DIAGNOSIS — F331 Major depressive disorder, recurrent, moderate: Secondary | ICD-10-CM | POA: Diagnosis present

## 2021-09-24 DIAGNOSIS — J449 Chronic obstructive pulmonary disease, unspecified: Secondary | ICD-10-CM | POA: Diagnosis not present

## 2021-09-24 DIAGNOSIS — R002 Palpitations: Secondary | ICD-10-CM | POA: Diagnosis present

## 2021-09-24 DIAGNOSIS — I5032 Chronic diastolic (congestive) heart failure: Secondary | ICD-10-CM | POA: Diagnosis not present

## 2021-09-24 DIAGNOSIS — K219 Gastro-esophageal reflux disease without esophagitis: Secondary | ICD-10-CM | POA: Diagnosis present

## 2021-09-24 DIAGNOSIS — I4891 Unspecified atrial fibrillation: Secondary | ICD-10-CM

## 2021-09-24 DIAGNOSIS — F419 Anxiety disorder, unspecified: Secondary | ICD-10-CM

## 2021-09-24 DIAGNOSIS — I499 Cardiac arrhythmia, unspecified: Secondary | ICD-10-CM | POA: Diagnosis not present

## 2021-09-24 HISTORY — DX: Unspecified atrial fibrillation: I48.91

## 2021-09-24 LAB — BASIC METABOLIC PANEL
Anion gap: 9 (ref 5–15)
BUN: 15 mg/dL (ref 8–23)
CO2: 23 mmol/L (ref 22–32)
Calcium: 8.8 mg/dL — ABNORMAL LOW (ref 8.9–10.3)
Chloride: 110 mmol/L (ref 98–111)
Creatinine, Ser: 0.66 mg/dL (ref 0.44–1.00)
GFR, Estimated: >60 mL/min (ref >60)
Glucose, Bld: 106 mg/dL — ABNORMAL HIGH (ref 70–99)
Potassium: 3.5 mmol/L (ref 3.5–5.1)
Sodium: 142 mmol/L (ref 135–145)

## 2021-09-24 LAB — CBC
HCT: 46 % (ref 36.0–46.0)
Hemoglobin: 15.3 g/dL — ABNORMAL HIGH (ref 12.0–15.0)
MCH: 29.4 pg (ref 26.0–34.0)
MCHC: 33.3 g/dL (ref 30.0–36.0)
MCV: 88.3 fL (ref 80.0–100.0)
Platelets: 298 10*3/uL (ref 150–400)
RBC: 5.21 MIL/uL — ABNORMAL HIGH (ref 3.87–5.11)
RDW: 12.7 % (ref 11.5–15.5)
WBC: 8.2 10*3/uL (ref 4.0–10.5)
nRBC: 0 % (ref 0.0–0.2)

## 2021-09-24 LAB — RESP PANEL BY RT-PCR (FLU A&B, COVID) ARPGX2
Influenza A by PCR: NEGATIVE
Influenza B by PCR: NEGATIVE
SARS Coronavirus 2 by RT PCR: NEGATIVE

## 2021-09-24 LAB — MAGNESIUM: Magnesium: 2 mg/dL (ref 1.7–2.4)

## 2021-09-24 LAB — RAPID URINE DRUG SCREEN, HOSP PERFORMED
Amphetamines: NOT DETECTED
Barbiturates: NOT DETECTED
Benzodiazepines: POSITIVE — AB
Cocaine: NOT DETECTED
Opiates: NOT DETECTED
Tetrahydrocannabinol: POSITIVE — AB

## 2021-09-24 MED ORDER — SODIUM CHLORIDE 0.9 % IV BOLUS
1000.0000 mL | Freq: Once | INTRAVENOUS | Status: AC
Start: 2021-09-24 — End: 2021-09-24
  Administered 2021-09-24: 1000 mL via INTRAVENOUS

## 2021-09-24 MED ORDER — DILTIAZEM HCL-DEXTROSE 125-5 MG/125ML-% IV SOLN (PREMIX)
INTRAVENOUS | Status: AC
  Administered 2021-09-24: 5 mg/h via INTRAVENOUS
  Filled 2021-09-24: qty 125

## 2021-09-24 MED ORDER — ADULT MULTIVITAMIN W/MINERALS CH
1.0000 | ORAL_TABLET | Freq: Every day | ORAL | Status: DC
  Administered 2021-09-24 – 2021-09-26 (×3): 1 via ORAL
  Filled 2021-09-24 (×3): qty 1

## 2021-09-24 MED ORDER — ONDANSETRON HCL 4 MG/2ML IJ SOLN: 4.0000 mg | Freq: Four times a day (QID) | INTRAMUSCULAR | Status: DC | PRN

## 2021-09-24 MED ORDER — MELATONIN 3 MG PO TABS
3.0000 mg | ORAL_TABLET | Freq: Every day | ORAL | Status: DC
  Administered 2021-09-24: 3 mg via ORAL
  Filled 2021-09-24: qty 1

## 2021-09-24 MED ORDER — APIXABAN 5 MG PO TABS
5.0000 mg | ORAL_TABLET | Freq: Two times a day (BID) | ORAL | Status: DC
Start: 2021-09-24 — End: 2021-09-27
  Administered 2021-09-24 – 2021-09-26 (×5): 5 mg via ORAL
  Filled 2021-09-24 (×5): qty 1

## 2021-09-24 MED ORDER — LORAZEPAM 2 MG/ML IJ SOLN
1.0000 mg | Freq: Once | INTRAMUSCULAR | Status: AC
  Administered 2021-09-24: 1 mg via INTRAVENOUS
  Filled 2021-09-24: qty 1

## 2021-09-24 MED ORDER — DILTIAZEM HCL-DEXTROSE 125-5 MG/125ML-% IV SOLN (PREMIX)
5.0000 mg/h | INTRAVENOUS | Status: DC
  Administered 2021-09-24: 5 mg/h via INTRAVENOUS
  Administered 2021-09-24: 15 mg/h via INTRAVENOUS

## 2021-09-24 MED ORDER — ALPRAZOLAM 0.5 MG PO TABS
1.0000 mg | ORAL_TABLET | Freq: Four times a day (QID) | ORAL | Status: DC | PRN
  Administered 2021-09-25 – 2021-09-26 (×4): 1 mg via ORAL
  Filled 2021-09-24 (×4): qty 2

## 2021-09-24 MED ORDER — DILTIAZEM HCL 25 MG/5ML IV SOLN: 10.0000 mg | Freq: Once | INTRAVENOUS | Status: DC

## 2021-09-24 MED ORDER — OXYCODONE HCL 5 MG PO TABS: 5.0000 mg | ORAL_TABLET | Freq: Four times a day (QID) | ORAL | Status: DC | PRN

## 2021-09-24 MED ORDER — MOMETASONE FURO-FORMOTEROL FUM 100-5 MCG/ACT IN AERO
2.0000 | INHALATION_SPRAY | Freq: Two times a day (BID) | RESPIRATORY_TRACT | Status: DC
  Administered 2021-09-24 – 2021-09-26 (×3): 2 via RESPIRATORY_TRACT
  Filled 2021-09-24: qty 8.8

## 2021-09-24 MED ORDER — POTASSIUM CHLORIDE CRYS ER 20 MEQ PO TBCR
40.0000 meq | EXTENDED_RELEASE_TABLET | Freq: Once | ORAL | Status: AC
  Administered 2021-09-24: 40 meq via ORAL
  Filled 2021-09-24: qty 2

## 2021-09-24 MED ORDER — DILTIAZEM HCL 25 MG/5ML IV SOLN
INTRAVENOUS | Status: AC
  Filled 2021-09-24: qty 5

## 2021-09-24 MED ORDER — DILTIAZEM HCL 60 MG PO TABS
30.0000 mg | ORAL_TABLET | Freq: Four times a day (QID) | ORAL | Status: DC
  Administered 2021-09-24 – 2021-09-26 (×6): 30 mg via ORAL
  Filled 2021-09-24 (×7): qty 1

## 2021-09-24 MED ORDER — POLYETHYLENE GLYCOL 3350 17 G PO PACK: 17.0000 g | PACK | Freq: Every day | ORAL | Status: DC | PRN

## 2021-09-24 MED ORDER — MORPHINE SULFATE (PF) 2 MG/ML IV SOLN
2.0000 mg | INTRAVENOUS | Status: DC | PRN
  Administered 2021-09-26: 2 mg via INTRAVENOUS
  Filled 2021-09-24: qty 1

## 2021-09-24 MED ORDER — ACETAMINOPHEN 325 MG PO TABS
650.0000 mg | ORAL_TABLET | Freq: Four times a day (QID) | ORAL | Status: DC | PRN
  Administered 2021-09-25 – 2021-09-26 (×4): 650 mg via ORAL
  Filled 2021-09-24 (×4): qty 2

## 2021-09-24 MED ORDER — DILTIAZEM LOAD VIA INFUSION
10.0000 mg | Freq: Once | INTRAVENOUS | Status: AC
  Administered 2021-09-24: 10 mg via INTRAVENOUS
  Filled 2021-09-24: qty 10

## 2021-09-24 NOTE — Telephone Encounter (Signed)
Called to check on patient who went to ED at [facility] for chest pain. Nurse caring for patient stated patient is in rapid afib and he is giving her Cardizem. ?

## 2021-09-24 NOTE — ED Notes (Signed)
Pt ambulated to restroom without assistance.

## 2021-09-24 NOTE — Telephone Encounter (Signed)
? ?  Pt c/o of Chest Pain: STAT if CP now or developed within 24 hours ? ?1. Are you having CP right now? Yes  ? ?2. Are you experiencing any other symptoms (ex. SOB, nausea, vomiting, sweating)?  ? ?3. How long have you been experiencing CP?  ? ?4. Is your CP continuous or coming and going?  ? ?5. Have you taken Nitroglycerin?  ? ?Pt called via call back queue, she was calling because she is having active CP, when I get to talk to her she said EMS is there now and they are bringing her to [facility] ED, she just wanted to let Dr. Stanford Breed know ??  ?

## 2021-09-24 NOTE — ED Triage Notes (Signed)
Presents to ER via EMS with c/o palpitations, EMS found her in A-fib with RVR and gave Cardizem 10 mg with no effect. States she stopped taking Diltiazem because of leg pain, and started on Metoprolol that she did not take. ?

## 2021-09-24 NOTE — ED Notes (Signed)
Provider at bedside

## 2021-09-24 NOTE — H&P (Addendum)
History and Physical  Felicia Odom IRC:[id number] DOB: 1956-02-12 DOA: 09/24/2021  Referring physician: Debe Coder, [place], [place]  PCP: Nicholos Johns, MD  Outpatient Specialists: General surgery, cardiology. Patient coming from: Home via EMS.  Chief Complaint: Palpitations.  HPI: Felicia Odom is a 66 y.o. female with medical history significant for recently diagnosed paroxysmal atrial fibrillation on Eliquis, followed by Dr. Stanford Breed, essential hypertension, chronic anxiety, cocaine use (March 24, 2021), medication noncompliance, recently diagnosed right lower extremity cellulitis on doxycycline, who presented from home via EMS with complaints of palpitations, onset a few hours prior to presentation.  Patient reports she was diagnosed with atrial fibrillation in January 2023.  She was prescribed diltiazem and Eliquis.  3 weeks ago she fell off her treadmill and had a right lower extremity wounds after her fall, she was treated with doxycycline and Keflex.  Started having vague symptoms, bilateral lower extremity pain, headaches which she attributed to all of her medications.  2 weeks ago she stopped taking all of her medications.  She decided to start exercising, hoping that her atrial fibrillation and hypertension will resolve.  Last night she reports symptoms of diaphoresis and headache.  She thought her symptoms were heart related and took 2 baby aspirin.  Checked her blood pressure at that time, it was systolically greater than 510 and diastolically 258 with a heart rate in 150s.  She was having a hard time going to sleep last night.  This morning her symptoms persisted.  This afternoon around 1 PM prior to her presentation, she called EMS.  She was brought into the ED for further evaluation.  While in the ED work-up revealed A-fib with RVR.  She was started on diltiazem drip.  She denies any chest pain or dyspnea.  Admits to intermittent palpitations.  [facility], hospitalist service, was called for  admission.  ED Course: Temperature 98.4.  BP 127/63, pulse 65, O2 saturation 96% on room air.  Lab studies essentially unremarkable.  Last TSH 1.890 on 08/03/2021.  Serum magnesium 2.0, potassium 3.5.  Review of Systems: Review of systems as noted in the HPI. All other systems reviewed and are negative.   Past Medical History:  Diagnosis Date   Anxiety    Arthritis    neck   Asthma    Chronic back pain    Chronic bronchitis (HCC)    Chronic neck pain    COPD (chronic obstructive pulmonary disease) (HCC)    Advair and Albuterol as needed as well as neb   Diverticulosis    GERD (gastroesophageal reflux disease)    takes Nexium daily   Helicobacter pylori gastritis 08/2019   Tx and eradicated   History of blood transfusion    in the 70's. No abnormal reaction noted   History of colon polyps    benign   Hyperlipidemia    not on any meds bc refused to pick up from pharmacy   Joint pain    Joint swelling    Mediastinal tumor    Nocturia    OCD (obsessive compulsive disorder)    Panic disorder    takes Xanax daily as needed   Pneumonia    2016   PONV (postoperative nausea and vomiting)    Seizures ([place])    in the 80's   Past Surgical History:  Procedure Laterality Date   COLONOSCOPY     ECTOPIC PREGNANCY SURGERY     x 2    ESOPHAGOGASTRODUODENOSCOPY     LAPAROSCOPIC LYSIS INTESTINAL ADHESIONS  x 2   RESECTION OF MEDIASTINAL MASS N/A 06/10/2016   Procedure: RESECTION OF MEDIASTINAL MASS;  Surgeon: Grace Isaac, MD;  Location: [facility];  Service: Thoracic;  Laterality: N/A;   VIDEO ASSISTED THORACOSCOPY Right 06/10/2016   Procedure: VIDEO ASSISTED THORACOSCOPY WITH PLACEMENT OF ON Q TUNNELER;  Surgeon: Grace Isaac, MD;  Location: [facility];  Service: Thoracic;  Laterality: Right;   VIDEO BRONCHOSCOPY N/A 06/10/2016   Procedure: VIDEO BRONCHOSCOPY;  Surgeon: Grace Isaac, MD;  Location: [facility];  Service: Thoracic;  Laterality: N/A;    Social History:   reports that she quit smoking about 6 years ago. Her smoking use included cigarettes. She has a 36.00 pack-year smoking history. She has never used smokeless tobacco. She reports that she does not drink alcohol and does not use drugs.   Allergies  Allergen Reactions   No Known Allergies     Family History  Problem Relation Age of Onset   Heart attack Father    Prostate cancer Father    Heart failure Father    Diabetes Mother    Clotting disorder Mother        PE   Heart failure Mother    Kidney disease Mother    Breast cancer Sister    Colon cancer Sister    Esophageal cancer Neg Hx    Rectal cancer Neg Hx    Stomach cancer Neg Hx       Prior to Admission medications   Medication Sig Start Date End Date Taking? Authorizing Provider  apixaban (ELIQUIS) 5 MG TABS tablet Take 1 tablet (5 mg total) by mouth 2 (two) times daily. 08/03/21  Yes Lelon Perla, MD  budesonide-formoterol (SYMBICORT) 80-4.5 MCG/ACT inhaler Inhale 2 puffs into the lungs 2 (two) times daily. 08/02/21  Yes Cobb, Karie Schwalbe, NP  doxycycline (VIBRAMYCIN) 100 MG capsule Take 100 mg by mouth 2 (two) times daily. 07/29/21  Yes [provider]  metoprolol succinate (TOPROL XL) 25 MG 24 hr tablet Take 1 tablet (25 mg total) by mouth at bedtime. 09/15/21  Yes Lelon Perla, MD  albuterol (PROVENTIL) (2.5 MG/3ML) 0.083% nebulizer solution USE 1 VIAL IN NEBULIZER EVERY 6 HOURS - And As Needed Patient taking differently: Take 2.5 mg by nebulization every 4 (four) hours as needed for shortness of breath. 03/06/21   Baird Lyons D, MD  albuterol (VENTOLIN HFA) 108 (90 Base) MCG/ACT inhaler INHALE 1 TO 2 PUFFS INTO THE LUNGS EVERY 4 HOURS AS NEEDED FOR WHEEZING OR SHORTNESS OF BREATH Patient taking differently: 1 puff every 6 (six) hours as needed for shortness of breath. 07/07/21   Deneise Lever, MD  ALPRAZolam Duanne Moron) 1 MG tablet Take 1 tablet (1 mg total) by mouth every 6 (six) hours as needed for anxiety.  08/02/21   Deneise Lever, MD  esomeprazole (NEXIUM) 40 MG capsule TAKE 1 CAPSULE BY MOUTH EVERY DAY Patient taking differently: 40 mg daily at 12 noon. 06/23/21   Noralyn Pick, NP  methocarbamol (ROBAXIN) 500 MG tablet Take 1 tablet (500 mg total) by mouth every 8 (eight) hours as needed for muscle spasms. 08/02/21   Cobb, Karie Schwalbe, NP  Multiple Vitamin (MULTIVITAMIN) tablet Take 1 tablet by mouth daily.    [provider]  mupirocin ointment (BACTROBAN) 2 % Apply 1 application topically 2 (two) times daily. 07/29/21   [provider]  Vitamin D, Ergocalciferol, (DRISDOL) 1.25 MG (50000 UNIT) CAPS capsule Take 50,000 Units by mouth  once a week. 06/21/21   [provider]  progesterone (PROMETRIUM) 100 MG capsule Take 100 mg by mouth daily.  10/06/11  [provider]    Physical Exam: BP 127/63    Pulse (!) 58    Resp (!) 22    Ht 5\' 6"  (1.676 m)    Wt 88 kg    SpO2 95%    BMI 31.31 kg/m   General: 66 y.o. year-old female well developed well nourished in no acute distress.  Alert and oriented x3. Cardiovascular: Irregular rate and rhythm with no rubs or gallops.  No thyromegaly or JVD noted.  No lower extremity edema. 2/4 pulses in all 4 extremities. Respiratory: Clear to auscultation with no wheezes or rales. Good inspiratory effort. Abdomen: Soft nontender nondistended with normal bowel sounds x4 quadrants. Muskuloskeletal: No cyanosis, clubbing or edema noted bilaterally Neuro: CN II-XII intact, strength, sensation, reflexes Skin: No ulcerative lesions noted or rashes Psychiatry: Judgement and insight appear normal. Mood is appropriate for condition and setting          Labs on Admission:  Basic Metabolic Panel: Recent Labs  Lab 09/24/21 1730  NA 142  K 3.5  CL 110  CO2 23  GLUCOSE 106*  BUN 15  CREATININE 0.66  CALCIUM 8.8*  MG 2.0   Liver Function Tests: No results for input(s): AST, ALT, ALKPHOS, BILITOT, PROT, ALBUMIN in  the last 168 hours. No results for input(s): LIPASE, AMYLASE in the last 168 hours. No results for input(s): AMMONIA in the last 168 hours. CBC: Recent Labs  Lab 09/24/21 1730  WBC 8.2  HGB 15.3*  HCT 46.0  MCV 88.3  PLT 298   Cardiac Enzymes: No results for input(s): CKTOTAL, CKMB, CKMBINDEX, TROPONINI in the last 168 hours.  BNP (last 3 results) No results for input(s): BNP in the last 8760 hours.  ProBNP (last 3 results) No results for input(s): PROBNP in the last 8760 hours.  CBG: No results for input(s): GLUCAP in the last 168 hours.  Radiological Exams on Admission: No results found.  EKG: I independently viewed the EKG done and my findings are as followed: A-fib with RVR rate of 124.  Nonspecific ST-T changes.  QTc 464.  Assessment/Plan Present on Admission:  Atrial fibrillation with RVR (HCC)  Principal Problem:   Atrial fibrillation with RVR (HCC)  Paroxysmal atrial fibrillation with RVR likely secondary to noncompliance with medications. Patient stopped taking her cardiac medications which included p.o. diltiazem and Eliquis for the past 2 weeks. Presented with A-fib with RVR, symptomatic while in RVR. Currently on diltiazem drip, started in the ED, rate is better controlled on the drip.  Start p.o. diltiazem 30 mg every 6 hours and wean off diltiazem drip. Continue to monitor on telemetry Resume home Eliquis Optimize magnesium and potassium levels Co. magnesium greater than 2.0 Goal potassium greater than 4.0.  Give a dose of p.o. KCl 40 mg x 1. Last TSH was normal, 1.890 on 08/03/21. Follows with Dr. Stanford Breed, cardiology, outpatient. Patient counseled on the importance of medication compliance. Consult cardiology in the morning since the patient is symptomatic from her A-fib with RVR. She denies any anginal symptoms.  Chronic anxiety Resume home Xanax PRN  History of cocaine and THC use UDS done on 03/14/21 positive for cocaine and THC Obtain UDS,  hold off on beta-blockers for now.  GERD Resume home PPI  Right lower extremity cellulitis, resolved She stopped taking home doxycycline  COPD Stable Resume home bronchodilators  Chronic diastolic CHF Last 2D echo done on 07/01/2021 showed LVEF 60 to 65% with grade 1 diastolic dysfunction. She is euvolemic on exam Start strict I's and O's and daily weight    DVT prophylaxis: Eliquis  Code Status: Full code.  Family Communication: Husband at bedside  Disposition Plan: Likely will discharge to home on 09/25/2021 once rate is controlled on p.o. medications.  Consults called: None  Admission status: Observation status.   Status is: Observation          Kayleen Memos MD [facility] Pager [phone]  If 7PM-7AM, please contact night-coverage [email or web address] Password [facility]  09/24/2021, 7:54 PM

## 2021-09-24 NOTE — ED Notes (Signed)
Pharmacy tech at bedside at this time ?

## 2021-09-24 NOTE — ED Notes (Signed)
Received verbal report from Danelle Berry RN at this time ?

## 2021-09-24 NOTE — ED Provider Notes (Signed)
[facility] EMERGENCY DEPARTMENT Provider Note   CSN: [id number] Arrival date & time: 09/24/21  1533     History  Chief Complaint  Patient presents with   Palpitations    Felicia Odom is a 66 y.o. female with a past medical history of anxiety, hyperlipidemia, depression, substance use disorder presenting today with complaint of palpitations and overall weakness.  In mid January patient was diagnosed with atrial fibrillation for the first time.  She was started on diltiazem and Eliquis.  On 09/15/2021 she called her cardiologist and told them that she did not want to continue to take diltiazem because she gained a lot of weight.  At that time he prescribed her metoprolol however she reports she never started taking it because she felt better.  She also stopped taking her blood thinner at that time.  Last night she began to feel diaphoretic and then this morning she started to feel as though her heart was racing.  Denies any difficulty breathing, dizziness or lightheadedness.  Does not have any active chest pain.  She called her cardiologist, Dr. Stanford Breed, who sent her to the emergency department.  Home Medications Prior to Admission medications   Medication Sig Start Date End Date Taking? Authorizing Provider  albuterol (PROVENTIL) (2.5 MG/3ML) 0.083% nebulizer solution USE 1 VIAL IN NEBULIZER EVERY 6 HOURS - And As Needed Patient taking differently: Take 2.5 mg by nebulization every 4 (four) hours as needed for shortness of breath. 03/06/21   Baird Lyons D, MD  albuterol (VENTOLIN HFA) 108 (90 Base) MCG/ACT inhaler INHALE 1 TO 2 PUFFS INTO THE LUNGS EVERY 4 HOURS AS NEEDED FOR WHEEZING OR SHORTNESS OF BREATH Patient taking differently: 1 puff every 6 (six) hours as needed for shortness of breath. 07/07/21   Deneise Lever, MD  ALPRAZolam Duanne Moron) 1 MG tablet Take 1 tablet (1 mg total) by mouth every 6 (six) hours as needed for anxiety. 08/02/21   Deneise Lever, MD   apixaban (ELIQUIS) 5 MG TABS tablet Take 1 tablet (5 mg total) by mouth 2 (two) times daily. 08/03/21   Lelon Perla, MD  budesonide-formoterol (SYMBICORT) 80-4.5 MCG/ACT inhaler Inhale 2 puffs into the lungs 2 (two) times daily. 08/02/21   Cobb, Karie Schwalbe, NP  doxycycline (VIBRAMYCIN) 100 MG capsule Take 100 mg by mouth 2 (two) times daily. 07/29/21   [provider]  esomeprazole (NEXIUM) 40 MG capsule TAKE 1 CAPSULE BY MOUTH EVERY DAY Patient taking differently: 40 mg daily at 12 noon. 06/23/21   Noralyn Pick, NP  methocarbamol (ROBAXIN) 500 MG tablet Take 1 tablet (500 mg total) by mouth every 8 (eight) hours as needed for muscle spasms. 08/02/21   Cobb, Karie Schwalbe, NP  metoprolol succinate (TOPROL XL) 25 MG 24 hr tablet Take 1 tablet (25 mg total) by mouth at bedtime. 09/15/21   Lelon Perla, MD  Multiple Vitamin (MULTIVITAMIN) tablet Take 1 tablet by mouth daily.    [provider]  mupirocin ointment (BACTROBAN) 2 % Apply 1 application topically 2 (two) times daily. 07/29/21   [provider]  Vitamin D, Ergocalciferol, (DRISDOL) 1.25 MG (50000 UNIT) CAPS capsule Take 50,000 Units by mouth once a week. 06/21/21   [provider]  progesterone (PROMETRIUM) 100 MG capsule Take 100 mg by mouth daily.  10/06/11  [provider]      Allergies    No known allergies    Review of Systems   Review of Systems  Cardiovascular:  Positive for palpitations.  See HPI  Physical Exam Updated Vital Signs BP 127/87    Resp (!) 34    Ht 5\' 6"  (1.676 m)    Wt 87.5 kg    BMI 31.15 kg/m  Physical Exam Vitals and nursing note reviewed.  Constitutional:      Appearance: Normal appearance.  HENT:     Head: Normocephalic and atraumatic.  Eyes:     General: No scleral icterus.    Conjunctiva/sclera: Conjunctivae normal.  Cardiovascular:     Rate and Rhythm: Tachycardia present. Rhythm irregular.     Heart sounds: No murmur  heard. Pulmonary:     Effort: Pulmonary effort is normal. No respiratory distress.     Breath sounds: No wheezing.  Skin:    Findings: No rash.  Neurological:     Mental Status: She is alert.  Psychiatric:        Mood and Affect: Mood normal.    ED Results / Procedures / Treatments   Labs (all labs ordered are listed, but only abnormal results are displayed) Labs Reviewed  BASIC METABOLIC PANEL - Abnormal; Notable for the following components:      Result Value   Glucose, Bld 106 (*)    Calcium 8.8 (*)    All other components within normal limits  CBC - Abnormal; Notable for the following components:   RBC 5.21 (*)    Hemoglobin 15.3 (*)    All other components within normal limits  MAGNESIUM    EKG EKG Interpretation  Date/Time:  Friday September 24 2021 16:31:22 EST Ventricular Rate:  124 PR Interval:    QRS Duration: 88 QT Interval:  323 QTC Calculation: 464 R Axis:   -28 Text Interpretation: Atrial fibrillation Borderline left axis deviation Low voltage, precordial leads Probable anteroseptal infarct, old Minimal ST depression, anterolateral leads Confirmed by Madalyn Rob [phone]) on 09/24/2021 6:03:05 PM  Radiology No results found.  Procedures .Critical Care Performed by: Rhae Hammock, PA-C Authorized by: Rhae Hammock, PA-C   Critical care provider statement:    Critical care time (minutes):  30   Critical care was necessary to treat or prevent imminent or life-threatening deterioration of the following conditions:  Cardiac failure and circulatory failure   Critical care was time spent personally by me on the following activities:  Development of treatment plan with patient or surrogate, evaluation of patient's response to treatment, examination of patient, ordering and review of laboratory studies, ordering and performing treatments and interventions, pulse oximetry, re-evaluation of patient's condition, review of old charts and obtaining history  from patient or surrogate   I assumed direction of critical care for this patient from another provider in my specialty: no     Care discussed with: admitting provider      Medications Ordered in ED Medications  apixaban (ELIQUIS) tablet 5 mg (5 mg Oral Given 09/24/21 1601)  sodium chloride 0.9 % bolus 1,000 mL (has no administration in time range)  diltiazem (CARDIZEM) 1 mg/mL load via infusion 10 mg (has no administration in time range)    And  diltiazem (CARDIZEM) 125 mg in dextrose 5% 125 mL (1 mg/mL) infusion (10 mg/hr Intravenous Rate/Dose Change 09/24/21 1604)  dilTIAZem HCl-Dextrose 125-5 MG/125ML-% infusion (has no administration in time range)  diltiazem (CARDIZEM) 25 MG/5ML injection (has no administration in time range)    ED Course/ Medical Decision Making/ A&P Clinical Course as of 09/24/21 1905  Fri Sep 24, 2021  1611 Patient complaining  of anxiety and her son is requesting she has something for her nerves.  Reports that she has a history of panic disorder.  Ativan ordered [MR]  1637 Heart rate 102 at this time [MR]  1904 Patient continues to be tachycardic between 105 and 120, to be admitted [MR]    Clinical Course User Index [MR] Maddisen Vought, Cecilio Asper, PA-C                           Medical Decision Making Amount and/or Complexity of Data Reviewed Labs: ordered.  Risk Prescription drug management.   Patient with known A-fib off her medications.  I personally reviewed patient's previous charts with cardiology as well as her admission in January and when she was diagnosed with atrial fibrillation.  This confirmed the patient's report of requesting cessation of diltiazem and instructions to initiate metoprolol.  Her CHA2DS2-VASc was calculated at 3.  On initial evaluation, patient's heart rate was in the 140s with her A-fib.  She was promptly started on diltiazem and load and infusion.  Heart rate came down to 92 shortly after.  She reported that she stopped feeling her  heart race.  Continues to deny dizziness, shortness of breath or chest pain.  Cardioversion was considered however patient has been off of her blood thinners for a few weeks.  Eliquis and diltiazem were ordered.   Physical exam. Patient very anxious, heart rate elevated however coming down with Dilt.  Lung sounds clear.  Work-up: Patient's lab work without abnormalities.  Disposition: Patient remains tachycardic on diltiazem drip.  We will continue to treat with diltiazem however patient will likely need admission for further management as well as initiation of her previous home medications.  She and her husband are agreeable to this plan.   Final Clinical Impression(s) / ED Diagnoses Final diagnoses:  Atrial fibrillation with RVR ([place])    Rx / DC Orders Admit to Unassigned.   Darliss Ridgel 09/24/21 1919    Truddie Hidden, MD 09/26/21 [id number]

## 2021-09-24 NOTE — ED Notes (Signed)
Admit provider at bedside at this time 

## 2021-09-24 NOTE — ED Notes (Signed)
Verbal report given to Ivette Loyal RN at this  time ?

## 2021-09-25 DIAGNOSIS — I4891 Unspecified atrial fibrillation: Secondary | ICD-10-CM | POA: Diagnosis not present

## 2021-09-25 DIAGNOSIS — J449 Chronic obstructive pulmonary disease, unspecified: Secondary | ICD-10-CM | POA: Diagnosis not present

## 2021-09-25 DIAGNOSIS — K219 Gastro-esophageal reflux disease without esophagitis: Secondary | ICD-10-CM | POA: Diagnosis not present

## 2021-09-25 DIAGNOSIS — I5032 Chronic diastolic (congestive) heart failure: Secondary | ICD-10-CM | POA: Diagnosis not present

## 2021-09-25 DIAGNOSIS — M79661 Pain in right lower leg: Secondary | ICD-10-CM | POA: Diagnosis not present

## 2021-09-25 DIAGNOSIS — I480 Paroxysmal atrial fibrillation: Secondary | ICD-10-CM | POA: Diagnosis not present

## 2021-09-25 DIAGNOSIS — I700 Atherosclerosis of aorta: Secondary | ICD-10-CM

## 2021-09-25 LAB — CBC
HCT: 41.2 % (ref 36.0–46.0)
Hemoglobin: 13.6 g/dL (ref 12.0–15.0)
MCH: 29 pg (ref 26.0–34.0)
MCHC: 33 g/dL (ref 30.0–36.0)
MCV: 87.8 fL (ref 80.0–100.0)
Platelets: 264 10*3/uL (ref 150–400)
RBC: 4.69 MIL/uL (ref 3.87–5.11)
RDW: 13.1 % (ref 11.5–15.5)
WBC: 6.5 10*3/uL (ref 4.0–10.5)
nRBC: 0 % (ref 0.0–0.2)

## 2021-09-25 LAB — BASIC METABOLIC PANEL
Anion gap: 7 (ref 5–15)
BUN: 11 mg/dL (ref 8–23)
CO2: 23 mmol/L (ref 22–32)
Calcium: 8.6 mg/dL — ABNORMAL LOW (ref 8.9–10.3)
Chloride: 109 mmol/L (ref 98–111)
Creatinine, Ser: 0.6 mg/dL (ref 0.44–1.00)
GFR, Estimated: >60 mL/min (ref >60)
Glucose, Bld: 133 mg/dL — ABNORMAL HIGH (ref 70–99)
Potassium: 3.7 mmol/L (ref 3.5–5.1)
Sodium: 139 mmol/L (ref 135–145)

## 2021-09-25 LAB — HIV ANTIBODY (ROUTINE TESTING W REFLEX): HIV Screen 4th Generation wRfx: NONREACTIVE

## 2021-09-25 LAB — MAGNESIUM: Magnesium: 2 mg/dL (ref 1.7–2.4)

## 2021-09-25 MED ORDER — PANTOPRAZOLE SODIUM 40 MG PO TBEC
40.0000 mg | DELAYED_RELEASE_TABLET | Freq: Every day | ORAL | Status: DC
  Administered 2021-09-25 – 2021-09-26 (×2): 40 mg via ORAL
  Filled 2021-09-25 (×2): qty 1

## 2021-09-25 MED ORDER — MELATONIN 3 MG PO TABS: 3.0000 mg | ORAL_TABLET | Freq: Every evening | ORAL | Status: DC | PRN

## 2021-09-25 MED ORDER — METHOCARBAMOL 500 MG PO TABS
500.0000 mg | ORAL_TABLET | Freq: Three times a day (TID) | ORAL | Status: DC | PRN
  Administered 2021-09-25 – 2021-09-26 (×3): 500 mg via ORAL
  Filled 2021-09-25 (×3): qty 1

## 2021-09-25 MED ORDER — LACTATED RINGERS IV SOLN
INTRAVENOUS | Status: AC
Start: 2021-09-25 — End: 2021-09-25

## 2021-09-25 MED ORDER — POTASSIUM CHLORIDE CRYS ER 20 MEQ PO TBCR
40.0000 meq | EXTENDED_RELEASE_TABLET | Freq: Once | ORAL | Status: AC
  Administered 2021-09-25: 40 meq via ORAL
  Filled 2021-09-25: qty 2

## 2021-09-25 MED ORDER — ROSUVASTATIN CALCIUM 5 MG PO TABS
5.0000 mg | ORAL_TABLET | Freq: Every day | ORAL | Status: DC
Start: 2021-09-25 — End: 2021-09-27
  Administered 2021-09-25 – 2021-09-26 (×2): 5 mg via ORAL
  Filled 2021-09-25 (×2): qty 1

## 2021-09-25 NOTE — Progress Notes (Signed)
PROGRESS NOTE    CAMBER NINH  NLZ:[id number] DOB: 1956-05-15 DOA: 09/24/2021 PCP: Nicholos Johns, MD    Chief Complaint  Patient presents with   Palpitations    Brief Narrative:  Patient 66 year old female history of recently diagnosed paroxysmal A-fib on anticoagulation with Eliquis, hypertension, anxiety, prior history of polysubstance abuse last use March 24, 2021, medication noncompliance, recently diagnosed with right lower extremity cellulitis on doxycycline presented to ED via EMS from home complaints of palpitations.  Patient noted to have been on diltiazem and Eliquis in the past.  Patient noted to have fallen off a treadmill 3 weeks ago right lower extremity wounds treated with Doxy and Keflex.  Patient noted to have some vague symptoms of bilateral lower extremity pain, headache that she felt was secondary to all her medications and as such discontinued all of her medication 2 weeks prior to admission.  Patient stated was initially feeling well with exercising.  Patient presented with 6 recent symptoms of diaphoresis, headache, palpitations.  Seen in the ED noted to be in A-fib with RVR placed on a Cardizem drip.   Assessment & Plan:   Principal Problem:   Atrial fibrillation with RVR (HCC) Active Problems:   GERD (gastroesophageal reflux disease)   Atherosclerosis of aorta (HCC)   COPD mixed type (HCC)   Moderate episode of recurrent major depressive disorder (HCC)  #1 paroxysmal atrial fibrillation with RVR ( cha2ds2vasc SCORE 2) -Likely secondary to medication noncompliance as patient discontinued her cardiac medications of oral diltiazem and Eliquis over the past 2 weeks. -Patient had presented with A-fib with RVR symptomatic while in the RVR and placed on a Cardizem drip in the ED. -Rate better controlled on a Cardizem drip and was subsequently weaned off Cardizem drip and placed on diltiazem 30 mg p.o. every 6 hours. -Noted to have converted and currently normal  sinus rhythm on telemetry. -Potassium at 3.7, magnesium at 2.0. -Keep potassium approximately at 4. -Last TSH within normal limits. -K-Dur 40 mEq p.o. x1. -Patient noted to have recently been changed from aspirin to Eliquis per her primary cardiologist which has been resumed which we will continue. -Patient counseled on importance of medication compliance. -If heart rate remained stable on diltiazem 30 p.o. every 6 hours for the next 24 hours we will consolidate diltiazem back to home regimen. -We will need outpatient follow-up with cardiology.  2.  Chronic anxiety -Continue home regimen Xanax as needed.  3.  GERD PPI.  4.  COPD Stable. Continue bronchodilators.  5.  Chronic diastolic CHF -2D echo from 07/11/2021 with EF of 60 to 90%, grade 1 diastolic dysfunction. -Currently euvolemic. -Strict I's and O's, daily weights.  6.  History of cocaine and THC use -Patient noted to have had a positive UDS for cocaine and THC 03/14/2021. -UDS done positive for benzodiazepines and THC. -Continue to hold beta-blocker.  7.  Atherosclerosis of the aorta -Per cardiologist recent note patient was to be started on a statin which we will start.    DVT prophylaxis: Eliquis Code Status: Full Family Communication: Updated patient and husband at bedside. Disposition: Home when clinically improved hopefully in the next 24 hours.  Status is: Observation The patient remains OBS appropriate and will d/c before 2 midnights.         Consultants:  None  Procedures:  None  Antimicrobials:  None   Subjective: Patient laying in bed.  Denies any chest pain.  No shortness of breath.  No headache.  Overall feels significantly  better than she did on admission.  States she feels she might be ready to go home.  Husband at bedside.  Patient noted on telemetry to have converted to normal sinus rhythm  Objective: Vitals:   09/25/21 0133 09/25/21 0632 09/25/21 1000 09/25/21 1038  BP: (!)  115/57 103/68    Pulse: 62 69 66 66  Resp: (!) 22 20    Temp: 97.8 F (36.6 C) 97.8 F (36.6 C)    TempSrc: Oral Oral    SpO2: 94% 94% 99% 98%  Weight: 86.4 kg     Height: '5\' 6"'$  (1.676 m)       Intake/Output Summary (Last 24 hours) at 09/25/2021 1640 Last data filed at 09/25/2021 0315 Gross per 24 hour  Intake 1394.23 ml  Output --  Net 1394.23 ml   Filed Weights   09/24/21 1540 09/24/21 1854 09/25/21 0133  Weight: 87.5 kg 88 kg 86.4 kg    Examination:  General exam: Appears calm and comfortable  Respiratory system: Clear to auscultation. Respiratory effort normal. Cardiovascular system: S1 & S2 heard, RRR. No JVD, murmurs, rubs, gallops or clicks. No pedal edema. Gastrointestinal system: Abdomen is nondistended, soft and nontender. No organomegaly or masses felt. Normal bowel sounds heard. Central nervous system: Alert and oriented. No focal neurological deficits. Extremities: Symmetric 5 x 5 power. Skin: No rashes, lesions or ulcers Psychiatry: Judgement and insight appear normal. Mood & affect appropriate.     Data Reviewed: I have personally reviewed following labs and imaging studies  CBC: Recent Labs  Lab 09/24/21 1730 09/25/21 0246  WBC 8.2 6.5  HGB 15.3* 13.6  HCT 46.0 41.2  MCV 88.3 87.8  PLT 298 509    Basic Metabolic Panel: Recent Labs  Lab 09/24/21 1730 09/25/21 0246  NA 142 139  K 3.5 3.7  CL 110 109  CO2 23 23  GLUCOSE 106* 133*  BUN 15 11  CREATININE 0.66 0.60  CALCIUM 8.8* 8.6*  MG 2.0 2.0    GFR: Estimated Creatinine Clearance: 78.6 mL/min (by C-G formula based on SCr of 0.6 mg/dL).  Liver Function Tests: No results for input(s): AST, ALT, ALKPHOS, BILITOT, PROT, ALBUMIN in the last 168 hours.  CBG: No results for input(s): GLUCAP in the last 168 hours.   Recent Results (from the past 240 hour(s))  Resp Panel by RT-PCR (Flu A&B, Covid) Nasopharyngeal Swab     Status: None   Collection Time: 09/24/21  8:09 PM   Specimen:  Nasopharyngeal Swab; Nasopharyngeal(NP) swabs in vial transport medium  Result Value Ref Range Status   SARS Coronavirus 2 by RT PCR NEGATIVE NEGATIVE Final    Comment: (NOTE) SARS-CoV-2 target nucleic acids are NOT DETECTED.  The SARS-CoV-2 RNA is generally detectable in upper respiratory specimens during the acute phase of infection. The lowest concentration of SARS-CoV-2 viral copies this assay can detect is 138 copies/mL. A negative result does not preclude SARS-Cov-2 infection and should not be used as the sole basis for treatment or other patient management decisions. A negative result may occur with  improper specimen collection/handling, submission of specimen other than nasopharyngeal swab, presence of viral mutation(s) within the areas targeted by this assay, and inadequate number of viral copies(<138 copies/mL). A negative result must be combined with clinical observations, patient history, and epidemiological information. The expected result is Negative.  Fact Sheet for Patients:  EntrepreneurPulse.com.au  Fact Sheet for Healthcare Providers:  IncredibleEmployment.be  This test is no t yet approved or cleared by the [place]  States FDA and  has been authorized for detection and/or diagnosis of SARS-CoV-2 by FDA under an Emergency Use Authorization (EUA). This EUA will remain  in effect (meaning this test can be used) for the duration of the COVID-19 declaration under Section 564(b)(1) of the Act, 21 U.S.C.section 360bbb-3(b)(1), unless the authorization is terminated  or revoked sooner.       Influenza A by PCR NEGATIVE NEGATIVE Final   Influenza B by PCR NEGATIVE NEGATIVE Final    Comment: (NOTE) The Xpert Xpress SARS-CoV-2/FLU/RSV plus assay is intended as an aid in the diagnosis of influenza from Nasopharyngeal swab specimens and should not be used as a sole basis for treatment. Nasal washings and aspirates are unacceptable for  Xpert Xpress SARS-CoV-2/FLU/RSV testing.  Fact Sheet for Patients: EntrepreneurPulse.com.au  Fact Sheet for Healthcare Providers: IncredibleEmployment.be  This test is not yet approved or cleared by the [place] FDA and has been authorized for detection and/or diagnosis of SARS-CoV-2 by FDA under an Emergency Use Authorization (EUA). This EUA will remain in effect (meaning this test can be used) for the duration of the COVID-19 declaration under Section 564(b)(1) of the Act, 21 U.S.C. section 360bbb-3(b)(1), unless the authorization is terminated or revoked.  Performed at [facility], [place] [address], [place], [place] [address]          Radiology Studies: No results found.      Scheduled Meds:  apixaban  5 mg Oral BID   diltiazem  30 mg Oral Q6H   mometasone-formoterol  2 puff Inhalation BID   multivitamin with minerals  1 tablet Oral Daily   pantoprazole  40 mg Oral Daily   rosuvastatin  5 mg Oral Daily   Continuous Infusions:     LOS: 0 days    Time spent: 40 minutes    Irine Seal, MD [facility]   To contact the attending provider between 7A-7P or the covering provider during after hours 7P-7A, please log into the web site [email or web address] and access using universal [place] password for that web site. If you do not have the password, please call the hospital operator.  09/25/2021, 4:40 PM

## 2021-09-25 NOTE — Care Management Obs Status (Signed)
MEDICARE OBSERVATION STATUS NOTIFICATION ? ? ?Patient Details  ?Name: Felicia Odom ?MRN: [id number] ?Date of Birth: 1955/12/15 ? ? ?Medicare Observation Status Notification Given:  Yes ? ? ? ?Carles Collet, RN ?09/25/2021, 4:01 PM ?

## 2021-09-26 ENCOUNTER — Observation Stay (HOSPITAL_BASED_OUTPATIENT_CLINIC_OR_DEPARTMENT_OTHER): Payer: Medicare Other

## 2021-09-26 DIAGNOSIS — I4891 Unspecified atrial fibrillation: Secondary | ICD-10-CM | POA: Diagnosis not present

## 2021-09-26 DIAGNOSIS — M79604 Pain in right leg: Secondary | ICD-10-CM | POA: Diagnosis not present

## 2021-09-26 DIAGNOSIS — M79661 Pain in right lower leg: Secondary | ICD-10-CM

## 2021-09-26 DIAGNOSIS — M79605 Pain in left leg: Secondary | ICD-10-CM

## 2021-09-26 DIAGNOSIS — I5032 Chronic diastolic (congestive) heart failure: Secondary | ICD-10-CM

## 2021-09-26 DIAGNOSIS — I480 Paroxysmal atrial fibrillation: Secondary | ICD-10-CM | POA: Diagnosis not present

## 2021-09-26 DIAGNOSIS — F419 Anxiety disorder, unspecified: Secondary | ICD-10-CM

## 2021-09-26 DIAGNOSIS — K219 Gastro-esophageal reflux disease without esophagitis: Secondary | ICD-10-CM | POA: Diagnosis not present

## 2021-09-26 DIAGNOSIS — I700 Atherosclerosis of aorta: Secondary | ICD-10-CM | POA: Diagnosis not present

## 2021-09-26 LAB — BASIC METABOLIC PANEL
Anion gap: 6 (ref 5–15)
BUN: 13 mg/dL (ref 8–23)
CO2: 25 mmol/L (ref 22–32)
Calcium: 8.7 mg/dL — ABNORMAL LOW (ref 8.9–10.3)
Chloride: 107 mmol/L (ref 98–111)
Creatinine, Ser: 0.7 mg/dL (ref 0.44–1.00)
GFR, Estimated: >60 mL/min (ref >60)
Glucose, Bld: 119 mg/dL — ABNORMAL HIGH (ref 70–99)
Potassium: 4 mmol/L (ref 3.5–5.1)
Sodium: 138 mmol/L (ref 135–145)

## 2021-09-26 LAB — CBC
HCT: 41.5 % (ref 36.0–46.0)
Hemoglobin: 13.9 g/dL (ref 12.0–15.0)
MCH: 29.7 pg (ref 26.0–34.0)
MCHC: 33.5 g/dL (ref 30.0–36.0)
MCV: 88.7 fL (ref 80.0–100.0)
Platelets: 271 10*3/uL (ref 150–400)
RBC: 4.68 MIL/uL (ref 3.87–5.11)
RDW: 12.8 % (ref 11.5–15.5)
WBC: 5.7 10*3/uL (ref 4.0–10.5)
nRBC: 0 % (ref 0.0–0.2)

## 2021-09-26 LAB — MAGNESIUM: Magnesium: 1.9 mg/dL (ref 1.7–2.4)

## 2021-09-26 MED ORDER — DILTIAZEM HCL ER COATED BEADS 120 MG PO CP24: 120.0000 mg | ORAL_CAPSULE | Freq: Every day | ORAL | 1 refills | Status: DC

## 2021-09-26 MED ORDER — DILTIAZEM HCL ER COATED BEADS 120 MG PO CP24
120.0000 mg | ORAL_CAPSULE | Freq: Every day | ORAL | Status: DC
Start: 2021-09-26 — End: 2021-09-26

## 2021-09-26 MED ORDER — ROSUVASTATIN CALCIUM 5 MG PO TABS: 5.0000 mg | ORAL_TABLET | Freq: Every day | ORAL | 1 refills | Status: DC

## 2021-09-26 MED ORDER — DILTIAZEM HCL ER COATED BEADS 120 MG PO CP24
120.0000 mg | ORAL_CAPSULE | Freq: Every day | ORAL | Status: DC
  Administered 2021-09-26: 120 mg via ORAL
  Filled 2021-09-26: qty 1

## 2021-09-26 NOTE — Discharge Summary (Signed)
Physician Discharge Summary  Felicia Odom:[id number] DOB: Sep 20, 1955 DOA: 09/24/2021  PCP: Nicholos Johns, MD  Admit date: 09/24/2021 Discharge date: 09/26/2021  Time spent: 50 minutes  Recommendations for Outpatient Follow-up:  Follow-up with Dr. Stanford Breed, cardiology in 2 weeks for further management of A-fib Follow-up with Nicholos Johns, MD in 2 weeks.  On follow-up patient will need a basic metabolic profile, magnesium level done to follow-up on electrolytes and renal function.   Discharge Diagnoses:  Principal Problem:   Atrial fibrillation with RVR (HCC) Active Problems:   GERD (gastroesophageal reflux disease)   Atherosclerosis of aorta (HCC)   COPD mixed type (HCC)   Moderate episode of recurrent major depressive disorder (HCC)   Right calf pain   Anxiety   Chronic diastolic CHF (congestive heart failure) ([place])   Discharge Condition: Stable and improved.  Diet recommendation: Heart healthy  Filed Weights   09/24/21 1540 09/24/21 1854 09/25/21 0133  Weight: 87.5 kg 88 kg 86.4 kg    History of present illness:  HPI per Dr. Dois Davenport Felicia Odom is a 66 y.o. female with medical history significant for recently diagnosed paroxysmal atrial fibrillation on Eliquis, followed by Dr. Stanford Breed, essential hypertension, chronic anxiety, cocaine use (March 24, 2021), medication noncompliance, recently diagnosed right lower extremity cellulitis on doxycycline, who presented from home via EMS with complaints of palpitations, onset a few hours prior to presentation.  Patient reports she was diagnosed with atrial fibrillation in January 2023.  She was prescribed diltiazem and Eliquis.  3 weeks ago she fell off her treadmill and had a right lower extremity wounds after her fall, she was treated with doxycycline and Keflex.  Started having vague symptoms, bilateral lower extremity pain, headaches which she attributed to all of her medications.  2 weeks ago she stopped taking all of her  medications.  She decided to start exercising, hoping that her atrial fibrillation and hypertension will resolve.  Last night she reports symptoms of diaphoresis and headache.  She thought her symptoms were heart related and took 2 baby aspirin.  Checked her blood pressure at that time, it was systolically greater than 315 and diastolically 176 with a heart rate in 150s.  She was having a hard time going to sleep last night.  This morning her symptoms persisted.  This afternoon around 1 PM prior to her presentation, she called EMS.  She was brought into the ED for further evaluation.  While in the ED work-up revealed A-fib with RVR.  She was started on diltiazem drip.  She denies any chest pain or dyspnea.  Admits to intermittent palpitations.  [facility], hospitalist service, was called for admission.   ED Course: Temperature 98.4.  BP 127/63, pulse 65, O2 saturation 96% on room air.  Lab studies essentially unremarkable.  Last TSH 1.890 on 08/03/2021.  Serum magnesium 2.0, potassium 3.5.  Hospital Course:  #1 paroxysmal atrial fibrillation with RVR ( cha2ds2vasc SCORE 2) -Likely secondary to medication noncompliance as patient discontinued her cardiac medications of oral diltiazem and Eliquis over the past 2 weeks. -Patient had presented with A-fib with RVR symptomatic while in the RVR and placed on a Cardizem drip in the ED. -Rate better controlled on a Cardizem drip and was subsequently weaned off Cardizem drip and placed on diltiazem 30 mg p.o. every 6 hours. -Oral Cardizem consolidated to Cardizem CD 120 mg daily. -Noted to have converted and currently normal sinus rhythm on telemetry. -Electrolytes repleted.   -TSH within normal limits. -Patient noted to  have recently been changed from aspirin to Eliquis per her primary cardiologist which was resumed in house.   -Patient counseled on importance of medication compliance. -Patient is rate remained controlled and was in sinus rhythm and will be discharged  home in stable and improved condition. -Outpatient follow-up with cardiology 2 weeks post discharge.  2.  Chronic anxiety -Patient maintained on home regimen Xanax as needed.  3.  GERD Patient maintained on PPI.  4.  COPD Stable. Continue bronchodilators.  5.  Chronic diastolic CHF -2D echo from 07/11/2021 with EF of 60 to 64%, grade 1 diastolic dysfunction. -Remained euvolemic.  6.  History of cocaine and THC use -Patient noted to have had a positive UDS for cocaine and THC 03/14/2021. -UDS done positive for benzodiazepines and THC. -Beta-blocker discontinued, as patient noted not to be taking at home prior to admission.  7.  Atherosclerosis of the aorta -Per cardiologist recent note patient was to be started on a statin which was started during this hospitalization and patient be discharged home on.  8.  Right calf pain -On day of discharge patient with complaints of right calf pain which she states happens intermittently and was one of the reason she discontinued her medications. -Lower extremity Dopplers done were negative for DVT. -Symptomatic treatment was recommended to patient. -Outpatient follow-up with PCP.    Procedures: Lower extremity Dopplers 09/26/2021  Consultations: None  Discharge Exam: Vitals:   09/26/21 0600 09/26/21 0741  BP: 112/60   Pulse: 60   Resp: 18   Temp: 97.8 F (36.6 C)   SpO2:  100%    General: NAD Cardiovascular: RRR no murmurs rubs or gallops.  No JVD.  No lower extremity edema Respiratory: Clear to auscultation bilaterally.  No wheezes, no crackles, no rhonchi.  Fair air movement.  Speaking in full sentences.  Discharge Instructions   Discharge Instructions     Diet - low sodium heart healthy   Complete by: As directed    Increase activity slowly   Complete by: As directed    Increase activity slowly   Complete by: As directed       Allergies as of 09/26/2021       Reactions   No Known Allergies          Medication List     STOP taking these medications    doxycycline 100 MG capsule Commonly known as: VIBRAMYCIN   metoprolol succinate 25 MG 24 hr tablet Commonly known as: Toprol XL       TAKE these medications    acetaminophen 500 MG tablet Commonly known as: TYLENOL Take 1,000 mg by mouth every 6 (six) hours as needed.   albuterol (2.5 MG/3ML) 0.083% nebulizer solution Commonly known as: PROVENTIL USE 1 VIAL IN NEBULIZER EVERY 6 HOURS - And As Needed What changed: See the new instructions.   albuterol 108 (90 Base) MCG/ACT inhaler Commonly known as: VENTOLIN HFA INHALE 1 TO 2 PUFFS INTO THE LUNGS EVERY 4 HOURS AS NEEDED FOR WHEEZING OR SHORTNESS OF BREATH What changed: See the new instructions.   ALPRAZolam 1 MG tablet Commonly known as: XANAX Take 1 tablet (1 mg total) by mouth every 6 (six) hours as needed for anxiety.   apixaban 5 MG Tabs tablet Commonly known as: ELIQUIS Take 1 tablet (5 mg total) by mouth 2 (two) times daily.   budesonide-formoterol 80-4.5 MCG/ACT inhaler Commonly known as: Symbicort Inhale 2 puffs into the lungs 2 (two) times daily.   diltiazem 120 MG  24 hr capsule Commonly known as: CARDIZEM CD Take 1 capsule (120 mg total) by mouth daily. Start taking on: September 27, 2021   esomeprazole 40 MG capsule Commonly known as: NEXIUM TAKE 1 CAPSULE BY MOUTH EVERY DAY What changed:  how much to take when to take this reasons to take this additional instructions   methocarbamol 500 MG tablet Commonly known as: Robaxin Take 1 tablet (500 mg total) by mouth every 8 (eight) hours as needed for muscle spasms.   multivitamin tablet Take 1 tablet by mouth daily.   rosuvastatin 5 MG tablet Commonly known as: CRESTOR Take 1 tablet (5 mg total) by mouth daily. Start taking on: September 27, 2021   Vitamin D (Ergocalciferol) 1.25 MG (50000 UNIT) Caps capsule Commonly known as: DRISDOL Take 50,000 Units by mouth once a week. Fridays        Allergies  Allergen Reactions   No Known Allergies     Follow-up Information     Nicholos Johns, MD. Schedule an appointment as soon as possible for a visit in 2 week(s).   Specialty: Internal Medicine Contact information: [address] [place] [address] [place], Brian S, MD. Schedule an appointment as soon as possible for a visit in 2 week(s).   Specialty: Cardiology Contact information: [address] [place] [address] [phone]                  The results of significant diagnostics from this hospitalization (including imaging, microbiology, ancillary and laboratory) are listed below for reference.    Significant Diagnostic Studies: VAS [place] LOWER EXTREMITY VENOUS (DVT)  Result Date: 09/26/2021  Lower Venous DVT Study Patient Name:  ADELIA BAPTISTA  Date of Exam:   09/26/2021 Medical Rec #: [id number]             Accession #:    [id number] Date of Birth: 1956/04/07             Patient Gender: F Patient Age:   65 years Exam Location:  [facility] Procedure:      VAS [place] LOWER EXTREMITY VENOUS (DVT) Referring Phys: Irine Seal --------------------------------------------------------------------------------  Indications: Pain.  Comparison Study: Prior negative bilateral lower extremity venous duplex done                   06/23/2016 Performing Technologist: Sharion Dove RVS  Examination Guidelines: A complete evaluation includes B-mode imaging, spectral Doppler, color Doppler, and power Doppler as needed of all accessible portions of each vessel. Bilateral testing is considered an integral part of a complete examination. Limited examinations for reoccurring indications may be performed as noted. The reflux portion of the exam is performed with the patient in reverse Trendelenburg.  +---------+---------------+---------+-----------+----------+--------------+  RIGHT      Compressibility Phasicity Spontaneity Properties Thrombus Aging  +---------+---------------+---------+-----------+----------+--------------+  CFV       Full            Yes       Yes                                    +---------+---------------+---------+-----------+----------+--------------+  SFJ       Full                                                             +---------+---------------+---------+-----------+----------+--------------+  FV Prox   Full                                                             +---------+---------------+---------+-----------+----------+--------------+  FV Mid    Full                                                             +---------+---------------+---------+-----------+----------+--------------+  FV Distal Full                                                             +---------+---------------+---------+-----------+----------+--------------+  PFV       Full                                                             +---------+---------------+---------+-----------+----------+--------------+  POP       Full            Yes       Yes                                    +---------+---------------+---------+-----------+----------+--------------+  PTV       Full                                                             +---------+---------------+---------+-----------+----------+--------------+  PERO      Full                                                             +---------+---------------+---------+-----------+----------+--------------+   +---------+---------------+---------+-----------+----------+--------------+  LEFT      Compressibility Phasicity Spontaneity Properties Thrombus Aging  +---------+---------------+---------+-----------+----------+--------------+  CFV       Full            Yes       Yes                                    +---------+---------------+---------+-----------+----------+--------------+  SFJ       Full                                                              +---------+---------------+---------+-----------+----------+--------------+  FV Prox   Full                                                             +---------+---------------+---------+-----------+----------+--------------+  FV Mid    Full                                                             +---------+---------------+---------+-----------+----------+--------------+  FV Distal Full                                                             +---------+---------------+---------+-----------+----------+--------------+  PFV       Full                                                             +---------+---------------+---------+-----------+----------+--------------+  POP       Full            Yes       Yes                                    +---------+---------------+---------+-----------+----------+--------------+  PTV       Full                                                             +---------+---------------+---------+-----------+----------+--------------+  PERO      Full                                                             +---------+---------------+---------+-----------+----------+--------------+     Summary: BILATERAL: - No evidence of deep vein thrombosis seen in the lower extremities, bilaterally. - RIGHT: - No cystic structure found in the popliteal fossa.  LEFT: - No cystic structure found in the popliteal fossa.  *See table(s) above for measurements and observations.    Preliminary     Microbiology: Recent Results (from the past 240 hour(s))  Resp Panel by RT-PCR (Flu A&B, Covid) Nasopharyngeal Swab     Status: None   Collection Time: 09/24/21  8:09 PM   Specimen: Nasopharyngeal Swab; Nasopharyngeal(NP) swabs in vial transport medium  Result Value Ref Range Status   SARS Coronavirus 2 by RT PCR NEGATIVE NEGATIVE Final    Comment: (NOTE) SARS-CoV-2 target nucleic acids are NOT  DETECTED.  The SARS-CoV-2 RNA is generally detectable in upper  respiratory specimens during the acute phase of infection. The lowest concentration of SARS-CoV-2 viral copies this assay can detect is 138 copies/mL. A negative result does not preclude SARS-Cov-2 infection and should not be used as the sole basis for treatment or other patient management decisions. A negative result may occur with  improper specimen collection/handling, submission of specimen other than nasopharyngeal swab, presence of viral mutation(s) within the areas targeted by this assay, and inadequate number of viral copies(<138 copies/mL). A negative result must be combined with clinical observations, patient history, and epidemiological information. The expected result is Negative.  Fact Sheet for Patients:  EntrepreneurPulse.com.au  Fact Sheet for Healthcare Providers:  IncredibleEmployment.be  This test is no t yet approved or cleared by the [place] FDA and  has been authorized for detection and/or diagnosis of SARS-CoV-2 by FDA under an Emergency Use Authorization (EUA). This EUA will remain  in effect (meaning this test can be used) for the duration of the COVID-19 declaration under Section 564(b)(1) of the Act, 21 U.S.C.section 360bbb-3(b)(1), unless the authorization is terminated  or revoked sooner.       Influenza A by PCR NEGATIVE NEGATIVE Final   Influenza B by PCR NEGATIVE NEGATIVE Final    Comment: (NOTE) The Xpert Xpress SARS-CoV-2/FLU/RSV plus assay is intended as an aid in the diagnosis of influenza from Nasopharyngeal swab specimens and should not be used as a sole basis for treatment. Nasal washings and aspirates are unacceptable for Xpert Xpress SARS-CoV-2/FLU/RSV testing.  Fact Sheet for Patients: EntrepreneurPulse.com.au  Fact Sheet for Healthcare Providers: IncredibleEmployment.be  This test is not yet approved or cleared by the [place] FDA and has been  authorized for detection and/or diagnosis of SARS-CoV-2 by FDA under an Emergency Use Authorization (EUA). This EUA will remain in effect (meaning this test can be used) for the duration of the COVID-19 declaration under Section 564(b)(1) of the Act, 21 U.S.C. section 360bbb-3(b)(1), unless the authorization is terminated or revoked.  Performed at [facility], [place] [address] [place], [place] [address]      Labs: Basic Metabolic Panel: Recent Labs  Lab 09/24/21 1730 09/25/21 0246 09/26/21 0130  NA 142 139 138  K 3.5 3.7 4.0  CL 110 109 107  CO2 '23 23 25  '$ GLUCOSE 106* 133* 119*  BUN '15 11 13  '$ CREATININE 0.66 0.60 0.70  CALCIUM 8.8* 8.6* 8.7*  MG 2.0 2.0 1.9   Liver Function Tests: No results for input(s): AST, ALT, ALKPHOS, BILITOT, PROT, ALBUMIN in the last 168 hours. No results for input(s): LIPASE, AMYLASE in the last 168 hours. No results for input(s): AMMONIA in the last 168 hours. CBC: Recent Labs  Lab 09/24/21 1730 09/25/21 0246 09/26/21 0130  WBC 8.2 6.5 5.7  HGB 15.3* 13.6 13.9  HCT 46.0 41.2 41.5  MCV 88.3 87.8 88.7  PLT 298 264 271   Cardiac Enzymes: No results for input(s): CKTOTAL, CKMB, CKMBINDEX, TROPONINI in the last 168 hours. BNP: BNP (last 3 results) No results for input(s): BNP in the last 8760 hours.  ProBNP (last 3 results) No results for input(s): PROBNP in the last 8760 hours.  CBG: No results for input(s): GLUCAP in the last 168 hours.     Signed:  Irine Seal MD.  [facility] 09/26/2021, 5:42 PM

## 2021-09-26 NOTE — Progress Notes (Signed)
VASCULAR LAB ? ? ? ?Bilateral lower extremity venous duplex has been performed. ? ?See CV proc for preliminary results. ? ? ?Tayron Hunnell, RVT ?09/26/2021, 3:55 PM ? ?

## 2021-09-29 ENCOUNTER — Other Ambulatory Visit: Payer: Self-pay | Admitting: Nurse Practitioner

## 2021-09-29 DIAGNOSIS — M62838 Other muscle spasm: Secondary | ICD-10-CM

## 2021-09-30 DIAGNOSIS — J449 Chronic obstructive pulmonary disease, unspecified: Secondary | ICD-10-CM | POA: Diagnosis not present

## 2021-10-04 DIAGNOSIS — R000 Tachycardia, unspecified: Secondary | ICD-10-CM | POA: Diagnosis not present

## 2021-10-05 ENCOUNTER — Other Ambulatory Visit: Payer: Self-pay

## 2021-10-05 DIAGNOSIS — Z87898 Personal history of other specified conditions: Secondary | ICD-10-CM | POA: Diagnosis not present

## 2021-10-05 DIAGNOSIS — J42 Unspecified chronic bronchitis: Secondary | ICD-10-CM | POA: Insufficient documentation

## 2021-10-05 DIAGNOSIS — J189 Pneumonia, unspecified organism: Secondary | ICD-10-CM | POA: Insufficient documentation

## 2021-10-05 DIAGNOSIS — Z9289 Personal history of other medical treatment: Secondary | ICD-10-CM | POA: Insufficient documentation

## 2021-10-05 DIAGNOSIS — R569 Unspecified convulsions: Secondary | ICD-10-CM | POA: Insufficient documentation

## 2021-10-05 DIAGNOSIS — E785 Hyperlipidemia, unspecified: Secondary | ICD-10-CM | POA: Insufficient documentation

## 2021-10-05 DIAGNOSIS — M1990 Unspecified osteoarthritis, unspecified site: Secondary | ICD-10-CM | POA: Insufficient documentation

## 2021-10-05 DIAGNOSIS — Z8679 Personal history of other diseases of the circulatory system: Secondary | ICD-10-CM | POA: Diagnosis not present

## 2021-10-05 DIAGNOSIS — Z79899 Other long term (current) drug therapy: Secondary | ICD-10-CM | POA: Diagnosis not present

## 2021-10-05 DIAGNOSIS — M2550 Pain in unspecified joint: Secondary | ICD-10-CM | POA: Insufficient documentation

## 2021-10-05 DIAGNOSIS — I38 Endocarditis, valve unspecified: Secondary | ICD-10-CM | POA: Diagnosis not present

## 2021-10-05 DIAGNOSIS — G8929 Other chronic pain: Secondary | ICD-10-CM | POA: Insufficient documentation

## 2021-10-05 DIAGNOSIS — J019 Acute sinusitis, unspecified: Secondary | ICD-10-CM | POA: Diagnosis not present

## 2021-10-05 DIAGNOSIS — D4989 Neoplasm of unspecified behavior of other specified sites: Secondary | ICD-10-CM | POA: Insufficient documentation

## 2021-10-05 DIAGNOSIS — M2540 Effusion, unspecified joint: Secondary | ICD-10-CM | POA: Insufficient documentation

## 2021-10-05 DIAGNOSIS — R351 Nocturia: Secondary | ICD-10-CM | POA: Insufficient documentation

## 2021-10-05 DIAGNOSIS — R112 Nausea with vomiting, unspecified: Secondary | ICD-10-CM | POA: Insufficient documentation

## 2021-10-05 DIAGNOSIS — Z87891 Personal history of nicotine dependence: Secondary | ICD-10-CM | POA: Diagnosis not present

## 2021-10-05 DIAGNOSIS — K5790 Diverticulosis of intestine, part unspecified, without perforation or abscess without bleeding: Secondary | ICD-10-CM | POA: Insufficient documentation

## 2021-10-05 DIAGNOSIS — Z09 Encounter for follow-up examination after completed treatment for conditions other than malignant neoplasm: Secondary | ICD-10-CM | POA: Diagnosis not present

## 2021-10-06 ENCOUNTER — Ambulatory Visit (INDEPENDENT_AMBULATORY_CARE_PROVIDER_SITE_OTHER): Payer: Medicare Other | Admitting: Cardiology

## 2021-10-06 ENCOUNTER — Other Ambulatory Visit: Payer: Self-pay

## 2021-10-06 ENCOUNTER — Encounter: Payer: Self-pay | Admitting: Cardiology

## 2021-10-06 ENCOUNTER — Telehealth: Payer: Self-pay | Admitting: Cardiology

## 2021-10-06 VITALS — BP 118/70 | HR 65 | Ht 66.0 in | Wt 200.6 lb

## 2021-10-06 DIAGNOSIS — R079 Chest pain, unspecified: Secondary | ICD-10-CM | POA: Diagnosis not present

## 2021-10-06 DIAGNOSIS — I10 Essential (primary) hypertension: Secondary | ICD-10-CM | POA: Diagnosis not present

## 2021-10-06 DIAGNOSIS — R072 Precordial pain: Secondary | ICD-10-CM

## 2021-10-06 DIAGNOSIS — F1410 Cocaine abuse, uncomplicated: Secondary | ICD-10-CM

## 2021-10-06 DIAGNOSIS — I700 Atherosclerosis of aorta: Secondary | ICD-10-CM

## 2021-10-06 DIAGNOSIS — E785 Hyperlipidemia, unspecified: Secondary | ICD-10-CM

## 2021-10-06 HISTORY — DX: Atherosclerosis of aorta: I70.0

## 2021-10-06 HISTORY — DX: Chest pain, unspecified: R07.9

## 2021-10-06 NOTE — Telephone Encounter (Signed)
Patient states that on her AVS it has Cocaine abuse.  She is very upset that is on there, because she has never used drugs.  She would like removed from her record.  Patient would like to speak to the doctor about this.  ?

## 2021-10-06 NOTE — Addendum Note (Signed)
Addended by: Jyl Heinz R on: 10/06/2021 12:04 PM ? ? Modules accepted: Orders ? ?

## 2021-10-06 NOTE — Progress Notes (Addendum)
Cardiology Office Note:    Date:  10/06/2021   ID:  Felicia Odom, DOB 17-Aug-1955, MRN [id number]  PCP:  Felicia Lei, MD  Cardiologist:  Felicia Brothers, MD   Referring MD: Felicia Lei, MD    ASSESSMENT:    1. Hyperlipidemia with target LDL less than 100   2. Chest pain of uncertain etiology   3. Aortic atherosclerosis (HCC)   4. Essential hypertension   5. Cocaine abuse (HCC)    PLAN:    In order of problems listed above:  Aortic atherosclerosis: Secondary prevention stressed with the patient.  Importance of compliance with diet medication stressed and she vocalized understanding.  She was advised to walk at least half an hour a day 5 days a week and she promises to do so. Chest pain: Atypical in nature and does not occur on exertion.  Stabbing-like sensation which happens very rarely.  I would like to evaluate her with exercise stress Cardiolite.  She tells me that she walks 20 minutes or so on a regular basis.  This information will help me to guide antiarrhythmic therapy also. Paroxysmal atrial fibrillation:I discussed with the patient atrial fibrillation, disease process. Management and therapy including rate and rhythm control, anticoagulation benefits and potential risks were discussed extensively with the patient. Patient had multiple questions which were answered to patient's satisfaction Mixed dyslipidemia: Diet was emphasized.  She will have blood work today and Chem-7 and magnesium level based on hospital discharge summary which I reviewed.  Also her lipids are not at goal and diet and exercise stressed and will recheck it in follow-up appointment. Obesity: Weight reduction stressed diet emphasized and she promises to do better. Marijuana and cocaine use: I discussed risks and she understands and promises never to do again. Patient will be seen in follow-up appointment in 6 months or earlier if the patient has any concerns Addendum: 10/07/2021: Patient called this  morning and was not happy with the fact that I put my impression #6 about marijuana and cocaine use.  She mentions to me that she really has never used cocaine intentionally.  I made the mention in the assessment and plan because the urine test was positive in the past.  I discussed this with her at length.  She tells me that that use was not intentional it was used by her by air and that she has never done cocaine use before or after that event and I told her that I will document this. Signed Dr. Glean Hess Jeanclaude Odom 10/07/2021 8:18 AM    Medication Adjustments/Labs and Tests Ordered: Current medicines are reviewed at length with the patient today.  Concerns regarding medicines are outlined above.  No orders of the defined types were placed in this encounter.  No orders of the defined types were placed in this encounter.    No chief complaint on file.    History of Present Illness:    Felicia Odom is a 66 y.o. female.  Patient has requested for her care more.  When she followed from another provider of her practice.  She obesity.  She was admitted to the hospital and was found to be in atrial fibrillation with elevated ventricular rate.  She has history of smoking in the remote past.  Her blood test revealed cocaine in her system last year.  But she denies using it anymore.  No orthopnea or PND.  At the time of my evaluation, the patient is alert awake oriented and in no distress.  She wants to be established for paroxysmal atrial fibrillation.  CT scan has revealed aortic atherosclerosis.  Past Medical History:  Diagnosis Date   Allergic rhinitis 10/01/2010   Qualifier: Diagnosis of  By: Felicia Hudson MD, Clinton D    Anxiety    Arthritis    neck   Atherosclerosis of aorta (HCC) 05/08/2013   CXR 10/115/14   Atrial fibrillation with RVR (HCC) 09/24/2021   Atypical chest pain 05/08/2013   Angina vs esophagitis   Cannabis dependence with current use (HCC) 07/26/2016   Chronic back pain    Chronic  bronchitis (HCC)    Chronic diastolic CHF (congestive heart failure) (HCC)    Chronic neck pain    Cocaine abuse (HCC) 04/05/2021   COPD mixed type (HCC) 02/18/2019   Office Spirometry 10/21/2015-slight restriction of exhaled volume, mild obstruction. FVC 2.69/78%, FEV1 2.04/76%, FEV1/FVC 0.76, FEF 25-75 percent 1.68/65% PFT 04/27/2016-minimal obstructive airways disease, minimal diffusion defect, insignificant response to bronchodilator. FVC 2.79/78%, FEV1 2.11/76%, ratio 0.76, FEF 25-75 percent 1.81/72%, TLC 92%, DLCO 76%.   DDD (degenerative disc disease), cervical 06/15/2015   Diverticulosis    Eczema 11/29/2017   Encounter for long-term opiate analgesic use 07/06/2017   Essential hypertension 09/25/2015   Former tobacco use 10/01/2018   Former smoker Quit 2017 36-pack-year smoking history  07/12/2018-lung cancer screening CT-lung RADS 1   GERD (gastroesophageal reflux disease)    takes Nexium daily   Helicobacter pylori gastritis 08/2019   Tx and eradicated   History of blood transfusion    in the 70's. No abnormal reaction noted   Hyperlipidemia    not on any meds bc refused to pick up from pharmacy   Hyperlipidemia with target LDL less than 100 01/21/2015   The 10-year ASCVD risk score Denman George DC Jr., et al., 2013) is: 10.4%   Values used to calculate the score:     Age: 45 years     Sex: Female     Is Non-Hispanic African American: No     Diabetic: Yes     Tobacco smoker: No     Systolic Blood Pressure: 128 mmHg     Is BP treated: No     HDL Cholesterol: 45.1 mg/dL     Total Cholesterol: 200 mg/dL   Joint pain    Joint swelling    Long-term current use of opiate analgesic 04/05/2021   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/08/2021   Mediastinal tumor    Moderate episode of recurrent major depressive disorder (HCC) 01/28/2020   Muscle spasm 08/02/2021   Nocturia    Obsessive-compulsive disorder 06/08/2007   Qualifier: Diagnosis of  By: Jerolyn Shin     PANIC DISORDER 06/08/2007   Qualifier: Diagnosis of  By:  Jerolyn Shin     Pneumonia    2016   PONV (postoperative nausea and vomiting)    Primary osteoarthritis involving multiple joints 01/21/2015   Right calf pain    Routine general medical examination at a health care facility 01/28/2020   Seborrheic dermatitis of scalp 02/11/2015   Seizures (HCC)    in the 80's   Thyroid nodule 07/30/2020   IMPRESSION: No significant interval change of bilateral thyroid nodules.   The above is in keeping with the ACR TI-RADS recommendations - J Am Coll Radiol 2017;14:587-595.     Electronically Signed   By: Acquanetta Belling M.D.   On: 08/17/2020 14:18   Type II diabetes mellitus with manifestations (HCC) 02/18/2019   The 10-year ASCVD risk score Denman George DC Montez Hageman.,  et al., 2013) is: 9.1%   Values used to calculate the score:     Age: 45 years     Sex: Female     Is Non-Hispanic African American: No     Diabetic: Yes     Tobacco smoker: No     Systolic Blood Pressure: 138 mmHg     Is BP treated: No     HDL Cholesterol: 46.2 mg/dL     Total Cholesterol: 180 mg/dL   URI (upper respiratory infection) 07/08/2021    Past Surgical History:  Procedure Laterality Date   COLONOSCOPY     ECTOPIC PREGNANCY SURGERY     x 2    ESOPHAGOGASTRODUODENOSCOPY     LAPAROSCOPIC LYSIS INTESTINAL ADHESIONS     x 2   RESECTION OF MEDIASTINAL MASS N/A 06/10/2016   Procedure: RESECTION OF MEDIASTINAL MASS;  Surgeon: Delight Ovens, MD;  Location: [facility] OR;  Service: Thoracic;  Laterality: N/A;   VIDEO ASSISTED THORACOSCOPY Right 06/10/2016   Procedure: VIDEO ASSISTED THORACOSCOPY WITH PLACEMENT OF ON Q TUNNELER;  Surgeon: Delight Ovens, MD;  Location: [facility] OR;  Service: Thoracic;  Laterality: Right;   VIDEO BRONCHOSCOPY N/A 06/10/2016   Procedure: VIDEO BRONCHOSCOPY;  Surgeon: Delight Ovens, MD;  Location: [facility] OR;  Service: Thoracic;  Laterality: N/A;    Current Medications: Current Meds  Medication Sig   acetaminophen (TYLENOL) 500 MG tablet Take 1,000 mg by mouth every 6 (six) hours  as needed for fever or headache.   albuterol (PROVENTIL) (2.5 MG/3ML) 0.083% nebulizer solution USE 1 VIAL IN NEBULIZER EVERY 6 HOURS - And As Needed   albuterol (VENTOLIN HFA) 108 (90 Base) MCG/ACT inhaler INHALE 1 TO 2 PUFFS INTO THE LUNGS EVERY 4 HOURS AS NEEDED FOR WHEEZING OR SHORTNESS OF BREATH   ALPRAZolam (XANAX) 1 MG tablet Take 1 tablet (1 mg total) by mouth every 6 (six) hours as needed for anxiety.   apixaban (ELIQUIS) 5 MG TABS tablet Take 1 tablet (5 mg total) by mouth 2 (two) times daily.   budesonide-formoterol (SYMBICORT) 80-4.5 MCG/ACT inhaler Inhale 2 puffs into the lungs 2 (two) times daily.   diltiazem (CARDIZEM CD) 120 MG 24 hr capsule Take 1 capsule (120 mg total) by mouth daily.   escitalopram (LEXAPRO) 20 MG tablet Take 20 mg by mouth at bedtime.   esomeprazole (NEXIUM) 40 MG capsule TAKE 1 CAPSULE BY MOUTH EVERY DAY   methocarbamol (ROBAXIN) 500 MG tablet TAKE 1 TABLET BY MOUTH EVERY 8 HOURS AS NEEDED FOR MUSCLE SPASMS.   Multiple Vitamin (MULTIVITAMIN) tablet Take 1 tablet by mouth daily.   rosuvastatin (CRESTOR) 5 MG tablet Take 1 tablet (5 mg total) by mouth daily.   Vitamin D, Ergocalciferol, (DRISDOL) 1.25 MG (50000 UNIT) CAPS capsule Take 50,000 Units by mouth once a week. Fridays     Allergies:   No known allergies   Social History   Socioeconomic History   Marital status: Divorced    Spouse name: Not on file   Number of children: 1   Years of education: Not on file   Highest education level: Not on file  Occupational History   Occupation: retired  Tobacco Use   Smoking status: Former    Packs/day: 1.00    Years: 36.00    Pack years: 36.00    Types: Cigarettes    Quit date: 2017    Years since quitting: 6.2   Smokeless tobacco: Never   Tobacco comments:    last cigarette  2017  Vaping Use   Vaping Use: Never used  Substance and Sexual Activity   Alcohol use: No    Alcohol/week: 0.0 standard drinks   Drug use: No   Sexual activity: Not on  file  Other Topics Concern   Not on file  Social History Narrative   Retired [employer]   Divorced   1 son   Former smoker   2 coffee/daty   No EtOH, no drugs   Social Determinants of Corporate investment banker Strain: Not on file  Food Insecurity: Not on file  Transportation Needs: Not on file  Physical Activity: Not on file  Stress: Not on file  Social Connections: Not on file     Family History: The patient's family history includes Breast cancer in her sister; Clotting disorder in her mother; Colon cancer in her sister; Diabetes in her mother; Heart attack in her father; Heart failure in her father and mother; Kidney disease in her mother; Prostate cancer in her father. There is no history of Esophageal cancer, Rectal cancer, or Stomach cancer.  ROS:   Please see the history of present illness.    All other systems reviewed and are negative.  EKGs/Labs/Other Studies Reviewed:    The following studies were reviewed today: EKG reveals sinus rhythm and nonspecific ST-T changes   Recent Labs: 06/14/2021: ALT 17 08/03/2021: TSH 1.890 09/26/2021: BUN 13; Creatinine, Ser 0.70; Hemoglobin 13.9; Magnesium 1.9; Platelets 271; Potassium 4.0; Sodium 138  Recent Lipid Panel    Component Value Date/Time   CHOL 206 (H) 06/14/2021 1122   TRIG 206 (H) 06/14/2021 1122   HDL 46 06/14/2021 1122   CHOLHDL 4.5 (H) 06/14/2021 1122   CHOLHDL 4 10/28/2020 0827   VLDL 30.2 10/28/2020 0827   LDLCALC 124 (H) 06/14/2021 1122   LDLDIRECT 142.0 01/21/2015 1537    Physical Exam:    VS:  BP 118/70   Pulse 65   Ht 5\' 6"  (1.676 m)   Wt 200 lb 9.6 oz (91 kg)   SpO2 99%   BMI 32.38 kg/m     Wt Readings from Last 3 Encounters:  10/06/21 200 lb 9.6 oz (91 kg)  09/25/21 190 lb 8 oz (86.4 kg)  08/03/21 186 lb (84.4 kg)     GEN: Patient is in no acute distress HEENT: Normal NECK: No JVD; No carotid bruits LYMPHATICS: No lymphadenopathy CARDIAC: Hear sounds regular, 2/6 systolic  murmur at the apex. RESPIRATORY:  Clear to auscultation without rales, wheezing or rhonchi  ABDOMEN: Soft, non-tender, non-distended MUSCULOSKELETAL:  No edema; No deformity  SKIN: Warm and dry NEUROLOGIC:  Alert and oriented x 3 PSYCHIATRIC:  Normal affect   Signed, Felicia Brothers, MD  10/06/2021 9:49 AM    [facility]

## 2021-10-06 NOTE — Addendum Note (Signed)
Addended by: Jerl Santos R on: 10/06/2021 11:19 AM ? ? Modules accepted: Orders ? ?

## 2021-10-06 NOTE — Patient Instructions (Addendum)
Medication Instructions:  ?Your physician recommends that you continue on your current medications as directed. Please refer to the Current Medication list given to you today. ? ?*If you need a refill on your cardiac medications before your next appointment, please call your pharmacy* ? ? ? ?Lab Work: ?Your physician recommends that you return for lab work in: today for Bmet and CBC ?Come back before next visit with Revankar for BMP, Liver Function and Lipid profile ? ?If you have labs (blood work) drawn today and your tests are completely normal, you will receive your results only by: ?MyChart Message (if you have MyChart) OR ?A paper copy in the mail ?If you have any lab test that is abnormal or we need to change your treatment, we will call you to review the results. ? ? ?Testing/Procedures: ?Your physician has requested that you have a lexiscan myoview. For further information please visit [email or web address]. Please follow instruction sheet, as given. ? ? ?The test will take approximately 3 to 4 hours to complete; you may bring reading material.  If someone comes with you to your appointment, they will need to remain in the main lobby due to limited space in the testing area. **If you are pregnant or breastfeeding, please notify the nuclear lab prior to your appointment** ?Hold your Cardizem the morning of the stess test ? ?How to prepare for your Myocardial Perfusion Test: ?Do not eat or drink 3 hours prior to your test, except you may have water. ?Do not consume products containing caffeine (regular or decaffeinated) 12 hours prior to your test. (ex: coffee, chocolate, sodas, tea). ?Do bring a list of your current medications with you.  If not listed below, you may take your medications as normal. ?Do wear comfortable clothes (no dresses or overalls) and walking shoes, tennis shoes preferred (No heels or open toe shoes are allowed). ?Do NOT wear cologne, perfume, aftershave, or lotions (deodorant is  allowed). ?If these instructions are not followed, your test will have to be rescheduled. ? ? ? ?Follow-Up: ?At [facility], you and your health needs are our priority.  As part of our continuing mission to provide you with exceptional heart care, we have created designated Provider Care Teams.  These Care Teams include your primary Cardiologist (physician) and Advanced Practice Providers (APPs -  Physician Assistants and Nurse Practitioners) who all work together to provide you with the care you need, when you need it. ? ?We recommend signing up for the patient portal called "MyChart".  Sign up information is provided on this After Visit Summary.  MyChart is used to connect with patients for Virtual Visits (Telemedicine).  Patients are able to view lab/test results, encounter notes, upcoming appointments, etc.  Non-urgent messages can be sent to your provider as well.   ?To learn more about what you can do with MyChart, go to [email or web address].   ? ?Your next appointment:   ?2 month(s) ? ?The format for your next appointment:   ?In Person ? ?Provider:   ?Jyl Heinz, MD  ? ? ?Other Instructions ?  ?

## 2021-10-07 LAB — BASIC METABOLIC PANEL
BUN/Creatinine Ratio: 13 (ref 12–28)
BUN: 10 mg/dL (ref 8–27)
CO2: 27 mmol/L (ref 20–29)
Calcium: 8.9 mg/dL (ref 8.7–10.3)
Chloride: 101 mmol/L (ref 96–106)
Creatinine, Ser: 0.75 mg/dL (ref 0.57–1.00)
Glucose: 110 mg/dL — ABNORMAL HIGH (ref 70–99)
Potassium: 4.3 mmol/L (ref 3.5–5.2)
Sodium: 137 mmol/L (ref 134–144)
eGFR: 89 mL/min/{1.73_m2} (ref >59)

## 2021-10-07 LAB — MAGNESIUM: Magnesium: 2 mg/dL (ref 1.6–2.3)

## 2021-10-07 NOTE — Telephone Encounter (Signed)
Dr. Geraldo Pitter and myself spoke with pt regarding cocaine abuse being on her record. I explained that Dr. Ronnald Ramp added it to her record 9/22 after a positive urine. Pt states that she smoked marijuana with family in August but that she never done cocaine. I advised her to call Dr. Ronnald Ramp and discuss with his office. Pt verbalized understanding and had no additional questions.  ?

## 2021-10-11 ENCOUNTER — Other Ambulatory Visit: Payer: Self-pay | Admitting: Nurse Practitioner

## 2021-10-11 DIAGNOSIS — M62838 Other muscle spasm: Secondary | ICD-10-CM

## 2021-10-12 ENCOUNTER — Telehealth: Payer: Self-pay | Admitting: *Deleted

## 2021-10-12 NOTE — Telephone Encounter (Signed)
Patient given detailed instructions per Myocardial Perfusion Study Information Sheet for the test on 09/21/21 at 0800. Patient notified to arrive 15 minutes early and that it is imperative to arrive on time for appointment to keep from having the test rescheduled. ? If you need to cancel or reschedule your appointment, please call the office within 24 hours of your appointment. . Patient verbalized understanding.Gregg Winchell, Ranae Palms ? ? ?

## 2021-10-19 ENCOUNTER — Ambulatory Visit (INDEPENDENT_AMBULATORY_CARE_PROVIDER_SITE_OTHER): Payer: Medicare Other

## 2021-10-19 ENCOUNTER — Other Ambulatory Visit: Payer: Self-pay

## 2021-10-19 DIAGNOSIS — R072 Precordial pain: Secondary | ICD-10-CM | POA: Diagnosis not present

## 2021-10-19 DIAGNOSIS — R110 Nausea: Secondary | ICD-10-CM

## 2021-10-19 DIAGNOSIS — R519 Headache, unspecified: Secondary | ICD-10-CM

## 2021-10-19 DIAGNOSIS — M79606 Pain in leg, unspecified: Secondary | ICD-10-CM

## 2021-10-19 DIAGNOSIS — R079 Chest pain, unspecified: Secondary | ICD-10-CM

## 2021-10-19 DIAGNOSIS — R0600 Dyspnea, unspecified: Secondary | ICD-10-CM

## 2021-10-19 LAB — MYOCARDIAL PERFUSION IMAGING
LV dias vol: 74 mL (ref 46–106)
LV sys vol: 18 mL
Nuc Stress EF: 76 %
Rest Nuclear Isotope Dose: 10.8 mCi
SDS: 0
SRS: 2
SSS: 2
Stress Nuclear Isotope Dose: 31.9 mCi
TID: 0.93

## 2021-10-19 MED ORDER — TECHNETIUM TC 99M TETROFOSMIN IV KIT
31.9000 | PACK | Freq: Once | INTRAVENOUS | Status: AC | PRN
Start: 2021-10-19 — End: 2021-10-19
  Administered 2021-10-19: 31.9 via INTRAVENOUS

## 2021-10-19 MED ORDER — AMINOPHYLLINE 25 MG/ML IV SOLN
75.0000 mg | Freq: Once | INTRAVENOUS | Status: AC
  Administered 2021-10-19: 75 mg via INTRAVENOUS

## 2021-10-19 MED ORDER — TECHNETIUM TC 99M TETROFOSMIN IV KIT
10.8000 | PACK | Freq: Once | INTRAVENOUS | Status: AC | PRN
  Administered 2021-10-19: 10.8 via INTRAVENOUS

## 2021-10-19 MED ORDER — REGADENOSON 0.4 MG/5ML IV SOLN
0.4000 mg | Freq: Once | INTRAVENOUS | Status: AC
  Administered 2021-10-19: 0.4 mg via INTRAVENOUS

## 2021-10-19 MED ORDER — TECHNETIUM TC 99M TETROFOSMIN IV KIT: 31.9000 | PACK | Freq: Once | INTRAVENOUS | Status: DC | PRN

## 2021-10-19 MED ORDER — TECHNETIUM TC 99M TETROFOSMIN IV KIT
10.8000 | PACK | Freq: Once | INTRAVENOUS | Status: DC | PRN
Start: 2021-10-19 — End: 2021-10-19

## 2021-10-20 ENCOUNTER — Ambulatory Visit: Payer: Medicare Other | Admitting: Internal Medicine

## 2021-10-20 NOTE — Progress Notes (Signed)
? ? Patient ID: Felicia Odom, female    DOB: 1956-04-01, 66 y.o.   MRN: [id number] ? ?HPI ?F former smoker followed for COPD, Allergic rhinitis, complicated by Thymoma/ resected, anxiety ?Office spirometry- 01/29/15 ?Normal spirometry. FVC 2.74/81%, FEV1 2.11/79%, FEV1/FVC 0.77, FEF 25-75 percent 1.79 ?Office Spirometry 10/21/2015-slight restriction of exhaled volume, mild obstruction. FVC 2.69/78%, FEV1 2.04/76%, FEV1/FVC 0.76, FEF 25-75 percent 1.68/65% ?PFT 04/27/2016-minimal obstructive airways disease, minimal diffusion defect, insignificant response to bronchodilator. FVC 2.79/78%, FEV1 2.11/76%, ratio 0.76, FEF 25-75 percent 1.81/72%, TLC 92%, DLCO 76%. ?.---------------------------------------------------------------------------------------------------- ? ? ? ? ?04/22/21-  66 year old female former smoker followed for Asthma/Bronchitis, allergic Rhinitis, complicated by Resection Thymoma/ TSGY, OCD/panic, Anxiety/ Depression, GERD, DM2, HTN, Eczema, Osteoarthritis,  ?-Neb albuterol , Ventolin hfa, Xanax 1 mg ?Covid vax- 4 Phizer ?Flu vax-declines ?Dr Ronnald Ramp noted Pos drug screen opiates, cocaine, and also cannabis use. He allowed continuation of xanax. She tearfully denies anything but  occasional cannabis use. Says [facility] told her that "someone must have laced her pot". ?CT low dose due in December.  ?She is changing PCP and GYN- insurance ?Rarely needs her nebulizer machine or albuterol hfa. No exacerbation. ?Does ask refill xanax, which we have given for years for anxiety/ panic associated with asthma exacerbations in past. No excess sedation or misuse identified.  ?Chronic localized pain/ tenderness R inner breast border, considered nerve irritation, unchanged since onset at time of R thoracotomy for her thymectomy. ? ?10/20/21- 66 year old female former smoker (36 pk yrs) followed for Asthma/Bronchitis/ COPD,, allergic Rhinitis, complicated by Resection Thymoma/ TSGY, OCD/panic, Anxiety/ Depression,  GERD, DM2, HTN, Eczema, Osteoarthritis, Hyperlipidemia, AFib/ Eliquis, dCHF, Aortic Atherosclerosis, ?-Neb albuterol , Ventolin hfa, Xanax 1 mg,  ?Covid vax- 4 Phizer ?Flu vax-had ?Hosp 3/3- AFib,  ?PFT 10/21/21-mid obstruction, no resp to BD, airtrapping, normal DLCO ?Using rescue inhaler 1-2x/ day. Quit Symbicort because thought it might bother heart. ?Taking Xanax 1-4x/ day for anxiety- discussed.  ?CT chest low dose screen 08/26/21 ?IMPRESSION: ?1. Lung-RADS 1, negative. Continue annual screening with low-dose ?chest CT without contrast in 12 months. ?2. Mitral valve calcifications. ?3. Aortic Atherosclerosis (ICD10-I70.0) and Emphysema (ICD10-J43.9). ? ? ?Review of Systems- see HPI + = positive ?Constitutional:   No-   weight loss, +night sweats, fevers, chills, fatigue, lassitude. ?HEENT:   No-  headaches, difficulty swallowing, tooth/dental problems, sore throat,  ?     No-  sneezing, itching, ear ache, nasal congestion, post nasal drip,  ?CV:  + chest pain, No-orthopnea, PND, swelling in lower extremities, anasarca, dizziness, palpitations ?Resp: + shortness of breath with exertion or at rest.   ?           productive cough,   non-productive cough,  No- coughing up of blood.   ?         change in color of mucus.  No- wheezing.   ?Skin: No-   rash or lesions. ?GI:    heartburn, indigestion, No-abdominal pain, nausea, vomiting,  ?GU:  ?MS:  + joint pain or swelling.  Marland Kitchen ?Neuro-     nothing unusual ?Psych: change in mood or affect. +Chronic depression , + anxiety.  No memory loss. ?  ?Objective:  ? Physical Exam ?General- Alert, Oriented , Distress- none acute, + Obese. + easily tearful, ?Skin- rash-none, lesions- none, excoriation- none ?Lymphadenopathy- none ?Head- + healed scar forehead ?           Eyes- Gross vision intact, PERRLA, conjunctivae clear secretions ?  Ears- Hearing, canals-normal ?           Nose- No- rhinorrhea, no-Septal dev, polyps, erosion, perforation  ?           Throat- Mallampati  II , mucosa clear , drainage- none, tonsils- atrophic. +Missing teeth ?Neck- flexible , trachea midline, no stridor , thyroid nl, carotid no bruit ?Chest - symmetrical excursion , unlabored ?          Heart/CV- RRR , no murmur , no gallop, no rub, nl s1 s2 ?                          - JVD- none , edema- none, stasis changes- none, varices- none ?          Lung- wheeze-none, unlabored, cough-none, dullness-none, rub- none,  ?          Chest wall-  +R VATS scars s/p thymectomy. No rub. ?Abd-  ?Br/ Gen/ Rectal- Not done, not indicated ?Extrem-  ?Neuro- grossly intact to observation ? ? ?

## 2021-10-21 ENCOUNTER — Ambulatory Visit (INDEPENDENT_AMBULATORY_CARE_PROVIDER_SITE_OTHER): Payer: Medicare Other | Admitting: Internal Medicine

## 2021-10-21 ENCOUNTER — Ambulatory Visit: Payer: Medicare Other | Admitting: Internal Medicine

## 2021-10-21 ENCOUNTER — Encounter: Payer: Self-pay | Admitting: Internal Medicine

## 2021-10-21 DIAGNOSIS — R911 Solitary pulmonary nodule: Secondary | ICD-10-CM

## 2021-10-21 DIAGNOSIS — J449 Chronic obstructive pulmonary disease, unspecified: Secondary | ICD-10-CM

## 2021-10-21 LAB — PULMONARY FUNCTION TEST
DL/VA % pred: 112 %
DL/VA: 4.65 ml/min/mmHg/L
DLCO cor % pred: 94 %
DLCO cor: 19.78 ml/min/mmHg
DLCO unc % pred: 96 %
DLCO unc: 20.07 ml/min/mmHg
FEF 25-75 Post: 1.8 L/sec
FEF 25-75 Pre: 1.36 L/sec
FEF2575-%Change-Post: 32 %
FEF2575-%Pred-Post: 81 %
FEF2575-%Pred-Pre: 61 %
FEV1-%Change-Post: 9 %
FEV1-%Pred-Post: 75 %
FEV1-%Pred-Pre: 69 %
FEV1-Post: 1.94 L
FEV1-Pre: 1.77 L
FEV1FVC-%Change-Post: 12 %
FEV1FVC-%Pred-Pre: 97 %
FEV6-%Change-Post: -1 %
FEV6-%Pred-Post: 71 %
FEV6-%Pred-Pre: 72 %
FEV6-Post: 2.29 L
FEV6-Pre: 2.33 L
FEV6FVC-%Change-Post: 1 %
FEV6FVC-%Pred-Post: 104 %
FEV6FVC-%Pred-Pre: 102 %
FVC-%Change-Post: -2 %
FVC-%Pred-Post: 68 %
FVC-%Pred-Pre: 70 %
FVC-Post: 2.29 L
FVC-Pre: 2.36 L
Post FEV1/FVC ratio: 84 %
Post FEV6/FVC ratio: 100 %
Pre FEV1/FVC ratio: 75 %
Pre FEV6/FVC Ratio: 99 %
RV % pred: 99 %
RV: 2.15 L
TLC % pred: 91 %
TLC: 4.81 L

## 2021-10-21 MED ORDER — SPIRIVA RESPIMAT 1.25 MCG/ACT IN AERS: 2.0000 | INHALATION_SPRAY | Freq: Every day | RESPIRATORY_TRACT | 0 refills | Status: DC

## 2021-10-21 NOTE — Patient Instructions (Signed)
Order- sample Spiriva 1.25     Inhale 2 puffs, once daily ?If you like this, let us know and we will send prescription ? ?Ok to use your albuterol inhaler or nebulizer machine as before, when needed ? ?Please call if we can help ?

## 2021-10-21 NOTE — Progress Notes (Signed)
PFT done today. 

## 2021-10-27 ENCOUNTER — Telehealth: Payer: Self-pay | Admitting: Internal Medicine

## 2021-10-27 MED ORDER — SPIRIVA RESPIMAT 1.25 MCG/ACT IN AERS: 2.0000 | INHALATION_SPRAY | Freq: Every day | RESPIRATORY_TRACT | 5 refills | Status: DC

## 2021-10-27 NOTE — Telephone Encounter (Signed)
Rx for Spiriva has been sent to preferred pharmacy for pt. Attempted to call pt to let her know this had been done but unable to reach. Left detailed message for pt letting her know that the inhaler was sent in.  Nothing further needed. ?

## 2021-10-28 DIAGNOSIS — J449 Chronic obstructive pulmonary disease, unspecified: Secondary | ICD-10-CM | POA: Diagnosis not present

## 2021-10-29 ENCOUNTER — Other Ambulatory Visit: Payer: Self-pay | Admitting: Nurse Practitioner

## 2021-10-29 DIAGNOSIS — M62838 Other muscle spasm: Secondary | ICD-10-CM

## 2021-11-01 ENCOUNTER — Telehealth: Payer: Self-pay | Admitting: Cardiology

## 2021-11-01 DIAGNOSIS — I480 Paroxysmal atrial fibrillation: Secondary | ICD-10-CM

## 2021-11-01 MED ORDER — APIXABAN 5 MG PO TABS: 5.0000 mg | ORAL_TABLET | Freq: Two times a day (BID) | ORAL | 3 refills | Status: DC

## 2021-11-01 MED ORDER — DILTIAZEM HCL ER COATED BEADS 120 MG PO CP24: 120.0000 mg | ORAL_CAPSULE | Freq: Every day | ORAL | 3 refills | Status: DC

## 2021-11-01 MED ORDER — ROSUVASTATIN CALCIUM 5 MG PO TABS: 5.0000 mg | ORAL_TABLET | Freq: Every day | ORAL | 3 refills | Status: DC

## 2021-11-01 NOTE — Telephone Encounter (Signed)
Refill sent as requested. Pt is aware. ?

## 2021-11-01 NOTE — Telephone Encounter (Signed)
Pt c/o medication issue: ? ?1. Name of Medication: Diltiazem '120mg'$  ? ?2. How are you currently taking this medication (dosage and times per day)? N/A ? ?3. Are you having a reaction (difficulty breathing--STAT)? No  ? ?4. What is your medication issue? Pt states that while in the hospital the attending physician discontinued the above medication. Pt states the she needs this medication and is unable to get a refill from pharmacy due to it being discontinued. Pt also states that she needs refill on rosuvastatin (CRESTOR) 5 MG tablet. ? ?Please advise ?

## 2021-11-04 ENCOUNTER — Ambulatory Visit: Payer: Commercial Managed Care - HMO | Admitting: Student

## 2021-11-06 ENCOUNTER — Other Ambulatory Visit: Payer: Self-pay | Admitting: Nurse Practitioner

## 2021-11-06 DIAGNOSIS — M62838 Other muscle spasm: Secondary | ICD-10-CM

## 2021-11-08 ENCOUNTER — Other Ambulatory Visit: Payer: Self-pay | Admitting: Internal Medicine

## 2021-11-08 DIAGNOSIS — E785 Hyperlipidemia, unspecified: Secondary | ICD-10-CM

## 2021-11-11 ENCOUNTER — Telehealth: Payer: Self-pay | Admitting: Internal Medicine

## 2021-11-11 MED ORDER — AZITHROMYCIN 250 MG PO TABS: ORAL_TABLET | ORAL | 0 refills | Status: DC

## 2021-11-11 NOTE — Telephone Encounter (Signed)
Zpak 250 mg, # 6, 2 today then one daily 

## 2021-11-11 NOTE — Telephone Encounter (Signed)
Notified pt of Dr. Janee Morn recommendations. Pt stated understanding. Medication order placed. Nothing further needed at this time.  ?

## 2021-11-11 NOTE — Telephone Encounter (Signed)
Spoke with pt who states increased productive cough (green thick) and chest tightness. Pt states using Spiriva as directed and albuterol HFA and neb as needed which did help a little bit. Pt denies fever/ chills or GI upset. Pt states she feels like it is a URI. Dr. Annamaria Boots please advise.  ?

## 2021-11-16 ENCOUNTER — Encounter: Payer: Self-pay | Admitting: Internal Medicine

## 2021-11-16 NOTE — Assessment & Plan Note (Signed)
Mild obstruction, but would like to reduce use of LABA if she is dealing with AFib. ?Plan- sample Spiriva. Medication talk. ?

## 2021-11-16 NOTE — Assessment & Plan Note (Signed)
Plan to continue screening low dose CT ?

## 2021-11-19 ENCOUNTER — Telehealth: Payer: Self-pay | Admitting: Internal Medicine

## 2021-11-19 NOTE — Telephone Encounter (Signed)
Forms have been located and placed in Dr. Janee Morn sign folder ?

## 2021-11-19 NOTE — Telephone Encounter (Signed)
Will route message to Dr Janee Morn nurse to confirm that [facility] Duty paperwork was received and we will have Dr Annamaria Boots fill out paperwork.  ? ?Felicia Odom can you confirm if paperwork was received or not for this patient!  ?

## 2021-11-22 ENCOUNTER — Encounter: Payer: Self-pay | Admitting: Internal Medicine

## 2021-11-22 DIAGNOSIS — J449 Chronic obstructive pulmonary disease, unspecified: Secondary | ICD-10-CM | POA: Diagnosis not present

## 2021-11-22 NOTE — Telephone Encounter (Signed)
[employer] duty letter is with her summons in my out-tray on C pod ?

## 2021-11-23 ENCOUNTER — Other Ambulatory Visit: Payer: Self-pay | Admitting: Nurse Practitioner

## 2021-11-23 ENCOUNTER — Other Ambulatory Visit: Payer: Self-pay | Admitting: Internal Medicine

## 2021-11-23 DIAGNOSIS — J449 Chronic obstructive pulmonary disease, unspecified: Secondary | ICD-10-CM

## 2021-11-23 DIAGNOSIS — E785 Hyperlipidemia, unspecified: Secondary | ICD-10-CM

## 2021-11-23 NOTE — Telephone Encounter (Signed)
Patient came and picked up everything. Nothing further needed at this time. ?

## 2021-12-01 ENCOUNTER — Other Ambulatory Visit: Payer: Self-pay | Admitting: Internal Medicine

## 2021-12-01 ENCOUNTER — Other Ambulatory Visit: Payer: Self-pay | Admitting: Nurse Practitioner

## 2021-12-01 DIAGNOSIS — M62838 Other muscle spasm: Secondary | ICD-10-CM

## 2021-12-01 NOTE — Telephone Encounter (Signed)
Patient is requesting a refill on her Robaxin. It was prescribed by Katie on 09/29/21 for 60 tablets, no refills.  ? ?Joellen Jersey, are you ok with this refill or would you like for her to contact her PCP for the refill?  ?

## 2021-12-01 NOTE — Telephone Encounter (Signed)
Needs to contact PCP if she is still requiring. I sent as one time fill for muscle spasms months back. Thanks.

## 2021-12-02 ENCOUNTER — Telehealth: Payer: Self-pay | Admitting: Cardiology

## 2021-12-02 DIAGNOSIS — J209 Acute bronchitis, unspecified: Secondary | ICD-10-CM | POA: Diagnosis not present

## 2021-12-02 DIAGNOSIS — M7989 Other specified soft tissue disorders: Secondary | ICD-10-CM | POA: Diagnosis not present

## 2021-12-02 DIAGNOSIS — J440 Chronic obstructive pulmonary disease with acute lower respiratory infection: Secondary | ICD-10-CM | POA: Diagnosis not present

## 2021-12-02 NOTE — Telephone Encounter (Signed)
Pt c/o swelling: STAT is pt has developed SOB within 24 hours ? ?If swelling, where is the swelling located? Feet and ankles ? ?How much weight have you gained and in what time span? 14 lbs in about two months ? ?Have you gained 3 pounds in a day or 5 pounds in a week?  ? ?Do you have a log of your daily weights (if so, list)?  ? ?Are you currently taking a fluid pill? no ? ?Are you currently SOB? Yes but patient also has COPD and Emphysema  ? ?Have you traveled recently? No ? ?Patient has a telemedicine visit with her PCP at 3:30 today ?

## 2021-12-02 NOTE — Telephone Encounter (Signed)
Recommendations reviewed with pt as per Dr. Revankar's note.  Pt verbalized understanding and had no additional questions.   

## 2021-12-02 NOTE — Telephone Encounter (Signed)
Pt states that she has swelling in both lower legs. Pt states "I can't see my ankles." Pt states she only noticed the swelling yesterday. Pt weighted 203 at her 3/23 appointment and today she weighted 216.8 this am. Pt has been advised to keep a daily log of her weight, BP and HR. ?

## 2021-12-07 DIAGNOSIS — I10 Essential (primary) hypertension: Secondary | ICD-10-CM | POA: Diagnosis not present

## 2021-12-07 DIAGNOSIS — Z87891 Personal history of nicotine dependence: Secondary | ICD-10-CM | POA: Diagnosis not present

## 2021-12-07 DIAGNOSIS — Z87898 Personal history of other specified conditions: Secondary | ICD-10-CM | POA: Diagnosis not present

## 2021-12-07 DIAGNOSIS — N343 Urethral syndrome, unspecified: Secondary | ICD-10-CM | POA: Diagnosis not present

## 2021-12-07 DIAGNOSIS — Z8679 Personal history of other diseases of the circulatory system: Secondary | ICD-10-CM | POA: Diagnosis not present

## 2021-12-07 DIAGNOSIS — Z79899 Other long term (current) drug therapy: Secondary | ICD-10-CM | POA: Diagnosis not present

## 2021-12-07 DIAGNOSIS — E559 Vitamin D deficiency, unspecified: Secondary | ICD-10-CM | POA: Diagnosis not present

## 2021-12-07 DIAGNOSIS — K219 Gastro-esophageal reflux disease without esophagitis: Secondary | ICD-10-CM | POA: Diagnosis not present

## 2021-12-07 DIAGNOSIS — E785 Hyperlipidemia, unspecified: Secondary | ICD-10-CM | POA: Diagnosis not present

## 2021-12-07 DIAGNOSIS — I38 Endocarditis, valve unspecified: Secondary | ICD-10-CM | POA: Diagnosis not present

## 2021-12-07 DIAGNOSIS — J449 Chronic obstructive pulmonary disease, unspecified: Secondary | ICD-10-CM | POA: Diagnosis not present

## 2021-12-10 ENCOUNTER — Other Ambulatory Visit: Payer: Self-pay | Admitting: Internal Medicine

## 2021-12-10 DIAGNOSIS — E785 Hyperlipidemia, unspecified: Secondary | ICD-10-CM

## 2021-12-14 ENCOUNTER — Other Ambulatory Visit: Payer: Self-pay

## 2021-12-16 ENCOUNTER — Ambulatory Visit (INDEPENDENT_AMBULATORY_CARE_PROVIDER_SITE_OTHER): Payer: Medicare Other | Admitting: Cardiology

## 2021-12-16 ENCOUNTER — Encounter: Payer: Self-pay | Admitting: Cardiology

## 2021-12-16 VITALS — BP 108/60 | HR 76 | Ht 65.6 in | Wt 203.4 lb

## 2021-12-16 DIAGNOSIS — E669 Obesity, unspecified: Secondary | ICD-10-CM | POA: Diagnosis not present

## 2021-12-16 DIAGNOSIS — E785 Hyperlipidemia, unspecified: Secondary | ICD-10-CM | POA: Diagnosis not present

## 2021-12-16 DIAGNOSIS — J449 Chronic obstructive pulmonary disease, unspecified: Secondary | ICD-10-CM | POA: Diagnosis not present

## 2021-12-16 DIAGNOSIS — E66811 Obesity, class 1: Secondary | ICD-10-CM

## 2021-12-16 DIAGNOSIS — I480 Paroxysmal atrial fibrillation: Secondary | ICD-10-CM | POA: Diagnosis not present

## 2021-12-16 DIAGNOSIS — I10 Essential (primary) hypertension: Secondary | ICD-10-CM | POA: Diagnosis not present

## 2021-12-16 DIAGNOSIS — I700 Atherosclerosis of aorta: Secondary | ICD-10-CM

## 2021-12-16 HISTORY — DX: Obesity, class 1: E66.811

## 2021-12-16 HISTORY — DX: Paroxysmal atrial fibrillation: I48.0

## 2021-12-16 NOTE — Progress Notes (Signed)
Cardiology Office Note:    Date:  12/16/2021   ID:  Felicia Odom, DOB 1955/09/21, MRN [id number]  PCP:  Nicholos Johns, MD  Cardiologist:  Jenean Lindau, MD   Referring MD: Nicholos Johns, MD    ASSESSMENT:    1. Aortic atherosclerosis ([place])   2. Essential hypertension   3. Hyperlipidemia with target LDL less than 100   4. Obesity (BMI 30.0-34.9)   5. PAF (paroxysmal atrial fibrillation) (HCC)    PLAN:    In order of problems listed above:  Primary prevention stressed with the patient.  Importance of compliance with diet medication stressed and she vocalized understanding.  She is walking half an hour a day at least 5 times a day and she promises to continue. Mixed dyslipidemia: Diet was emphasized.  Lifestyle modification urged.  Lipids reviewed and found to be fine. Paroxysmal atrial fibrillation:I discussed with the patient atrial fibrillation, disease process. Management and therapy including rate and rhythm control, anticoagulation benefits and potential risks were discussed extensively with the patient. Patient had multiple questions which were answered to patient's satisfaction Obesity: Weight reduction was stressed and diet was emphasized.  Lifestyle modification urged. Patient will be seen in follow-up appointment in 6 months or earlier if the patient has any concerns    Medication Adjustments/Labs and Tests Ordered: Current medicines are reviewed at length with the patient today.  Concerns regarding medicines are outlined above.  No orders of the defined types were placed in this encounter.  No orders of the defined types were placed in this encounter.    No chief complaint on file.    History of Present Illness:    Felicia Odom is a 66 y.o. female.  Patient has past medical history of paroxysmal atrial fibrillation,, dyslipidemia and obesity.  She denies any problems at this time and takes care of activities of daily living.  No chest pain orthopnea  or PND.  At the time of my evaluation, the patient is alert awake oriented and in no distress.  She is walking on a regular basis.  Past Medical History:  Diagnosis Date   Allergic rhinitis 10/01/2010   Qualifier: Diagnosis of  By: Annamaria Boots MD, Clinton D    Anxiety    Aortic atherosclerosis ([place]) 10/06/2021   Arthritis    neck   Atherosclerosis of aorta ([place]) 05/08/2013   CXR 10/115/14   Atrial fibrillation with RVR ([place]) 09/24/2021   Atypical chest pain 05/08/2013   Angina vs esophagitis   Cannabis dependence with current use ([place]) 07/26/2016   Chest pain of uncertain etiology 6/50/3546   Chronic back pain    Chronic bronchitis (HCC)    Chronic diastolic CHF (congestive heart failure) ([place])    Chronic neck pain    Cocaine abuse ([place]) 04/05/2021   COPD mixed type ([place]) 02/18/2019   Office Spirometry 10/21/2015-slight restriction of exhaled volume, mild obstruction. FVC 2.69/78%, FEV1 2.04/76%, FEV1/FVC 0.76, FEF 25-75 percent 1.68/65% PFT 04/27/2016-minimal obstructive airways disease, minimal diffusion defect, insignificant response to bronchodilator. FVC 2.79/78%, FEV1 2.11/76%, ratio 0.76, FEF 25-75 percent 1.81/72%, TLC 92%, DLCO 76%.   DDD (degenerative disc disease), cervical 06/15/2015   Diverticulosis    Eczema 11/29/2017   Encounter for long-term opiate analgesic use 07/06/2017   Essential hypertension 09/25/2015   Former tobacco use 10/01/2018   Former smoker Quit 2017 36-pack-year smoking history  07/12/2018-lung cancer screening CT-lung RADS 1   GERD (gastroesophageal reflux disease)    takes Nexium daily   Helicobacter  pylori gastritis 08/2019   Tx and eradicated   History of blood transfusion    in the 70's. No abnormal reaction noted   Hyperlipidemia    not on any meds bc refused to pick up from pharmacy   Hyperlipidemia with target LDL less than 100 01/21/2015   The 10-year ASCVD risk score Mikey Bussing DC Jr., et al., 2013) is: 10.4%   Values used to calculate the score:     Age: 57  years     Sex: Female     Is Non-Hispanic African American: No     Diabetic: Yes     Tobacco smoker: No     Systolic Blood Pressure: 833 mmHg     Is BP treated: No     HDL Cholesterol: 45.1 mg/dL     Total Cholesterol: 200 mg/dL   Joint pain    Joint swelling    Long-term current use of opiate analgesic 04/05/2021   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/08/2021   Mediastinal tumor    Moderate episode of recurrent major depressive disorder ([place]) 01/28/2020   Muscle spasm 08/02/2021   Nocturia    Obsessive-compulsive disorder 06/08/2007   Qualifier: Diagnosis of  By: June Leap     PANIC DISORDER 06/08/2007   Qualifier: Diagnosis of  By: June Leap     Pneumonia    2016   PONV (postoperative nausea and vomiting)    Primary osteoarthritis involving multiple joints 01/21/2015   Right calf pain    Routine general medical examination at a health care facility 01/28/2020   Seborrheic dermatitis of scalp 02/11/2015   Seizures ([place])    in the 80's   Thyroid nodule 07/30/2020   IMPRESSION: No significant interval change of bilateral thyroid nodules.   The above is in keeping with the ACR TI-RADS recommendations - J Am Coll Radiol 2017;14:587-595.     Electronically Signed   By: Miachel Roux M.D.   On: 08/17/2020 14:18   Type II diabetes mellitus with manifestations ([place]) 02/18/2019   The 10-year ASCVD risk score Mikey Bussing DC Jr., et al., 2013) is: 9.1%   Values used to calculate the score:     Age: 23 years     Sex: Female     Is Non-Hispanic African American: No     Diabetic: Yes     Tobacco smoker: No     Systolic Blood Pressure: 825 mmHg     Is BP treated: No     HDL Cholesterol: 46.2 mg/dL     Total Cholesterol: 180 mg/dL   URI (upper respiratory infection) 07/08/2021    Past Surgical History:  Procedure Laterality Date   COLONOSCOPY     ECTOPIC PREGNANCY SURGERY     x 2    ESOPHAGOGASTRODUODENOSCOPY     LAPAROSCOPIC LYSIS INTESTINAL ADHESIONS     x 2   RESECTION OF MEDIASTINAL MASS N/A 06/10/2016   Procedure:  RESECTION OF MEDIASTINAL MASS;  Surgeon: Grace Isaac, MD;  Location: [facility];  Service: Thoracic;  Laterality: N/A;   VIDEO ASSISTED THORACOSCOPY Right 06/10/2016   Procedure: VIDEO ASSISTED THORACOSCOPY WITH PLACEMENT OF ON Q TUNNELER;  Surgeon: Grace Isaac, MD;  Location: [facility];  Service: Thoracic;  Laterality: Right;   VIDEO BRONCHOSCOPY N/A 06/10/2016   Procedure: VIDEO BRONCHOSCOPY;  Surgeon: Grace Isaac, MD;  Location: [facility];  Service: Thoracic;  Laterality: N/A;    Current Medications: Current Meds  Medication Sig   acetaminophen (TYLENOL) 500 MG tablet Take 1,000 mg  by mouth every 6 (six) hours as needed for fever or headache.   albuterol (PROVENTIL) (2.5 MG/3ML) 0.083% nebulizer solution USE 1 VIAL IN NEBULIZER EVERY 6 HOURS - And As Needed   albuterol (VENTOLIN HFA) 108 (90 Base) MCG/ACT inhaler INHALE 1 TO 2 PUFFS INTO THE LUNGS EVERY 4 HOURS AS NEEDED FOR WHEEZING OR SHORTNESS OF BREATH   ALPRAZolam (XANAX) 1 MG tablet Take 1 tablet (1 mg total) by mouth every 6 (six) hours as needed for anxiety.   apixaban (ELIQUIS) 5 MG TABS tablet Take 1 tablet (5 mg total) by mouth 2 (two) times daily.   diltiazem (CARDIZEM CD) 120 MG 24 hr capsule Take 1 capsule (120 mg total) by mouth daily.   esomeprazole (NEXIUM) 40 MG capsule Take 40 mg by mouth daily.   furosemide (LASIX) 40 MG tablet Take 40 mg by mouth 2 (two) times daily.   Multiple Vitamin (MULTIVITAMIN) tablet Take 1 tablet by mouth daily.   potassium chloride (MICRO-K) 10 MEQ CR capsule Take 10 mEq by mouth 2 (two) times daily.   rosuvastatin (CRESTOR) 5 MG tablet Take 1 tablet (5 mg total) by mouth daily.   Tiotropium Bromide Monohydrate (SPIRIVA RESPIMAT) 1.25 MCG/ACT AERS Inhale 2 puffs into the lungs daily.   Vitamin D, Ergocalciferol, (DRISDOL) 1.25 MG (50000 UNIT) CAPS capsule Take 50,000 Units by mouth once a week. Fridays     Allergies:   No known allergies   Social History   Socioeconomic History    Marital status: Divorced    Spouse name: Not on file   Number of children: 1   Years of education: Not on file   Highest education level: Not on file  Occupational History   Occupation: retired  Tobacco Use   Smoking status: Former    Packs/day: 1.00    Years: 36.00    Pack years: 36.00    Types: Cigarettes    Quit date: 2017    Years since quitting: 6.3   Smokeless tobacco: Never   Tobacco comments:    last cigarette 2017  Vaping Use   Vaping Use: Never used  Substance and Sexual Activity   Alcohol use: No    Alcohol/week: 0.0 standard drinks   Drug use: No   Sexual activity: Not on file  Other Topics Concern   Not on file  Social History Narrative   Retired [employer]   Divorced   1 son   Former smoker   2 coffee/daty   No EtOH, no drugs   Social Determinants of Radio broadcast assistant Strain: Not on file  Food Insecurity: Not on file  Transportation Needs: Not on file  Physical Activity: Not on file  Stress: Not on file  Social Connections: Not on file     Family History: The patient's family history includes Breast cancer in her sister; Clotting disorder in her mother; Colon cancer in her sister; Diabetes in her mother; Heart attack in her father; Heart failure in her father and mother; Kidney disease in her mother; Prostate cancer in her father. There is no history of Esophageal cancer, Rectal cancer, or Stomach cancer.  ROS:   Please see the history of present illness.    All other systems reviewed and are negative.  EKGs/Labs/Other Studies Reviewed:    The following studies were reviewed today: I discussed my findings with the patient at length.   Recent Labs: 06/14/2021: ALT 17 08/03/2021: TSH 1.890 09/26/2021: Hemoglobin 13.9; Platelets 271 10/06/2021: BUN  10; Creatinine, Ser 0.75; Magnesium 2.0; Potassium 4.3; Sodium 137  Recent Lipid Panel    Component Value Date/Time   CHOL 206 (H) 06/14/2021 1122   TRIG 206 (H) 06/14/2021 1122    HDL 46 06/14/2021 1122   CHOLHDL 4.5 (H) 06/14/2021 1122   CHOLHDL 4 10/28/2020 0827   VLDL 30.2 10/28/2020 0827   LDLCALC 124 (H) 06/14/2021 1122   LDLDIRECT 142.0 01/21/2015 1537    Physical Exam:    VS:  BP 108/60   Pulse 76   Ht 5' 5.6" (1.666 m)   Wt 203 lb 6.4 oz (92.3 kg)   SpO2 97%   BMI 33.23 kg/m     Wt Readings from Last 3 Encounters:  12/16/21 203 lb 6.4 oz (92.3 kg)  10/21/21 203 lb 9.6 oz (92.4 kg)  10/19/21 200 lb (90.7 kg)     GEN: Patient is in no acute distress HEENT: Normal NECK: No JVD; No carotid bruits LYMPHATICS: No lymphadenopathy CARDIAC: Hear sounds regular, 2/6 systolic murmur at the apex. RESPIRATORY:  Clear to auscultation without rales, wheezing or rhonchi  ABDOMEN: Soft, non-tender, non-distended MUSCULOSKELETAL:  No edema; No deformity  SKIN: Warm and dry NEUROLOGIC:  Alert and oriented x 3 PSYCHIATRIC:  Normal affect   Signed, Jenean Lindau, MD  12/16/2021 8:19 AM    [place]

## 2021-12-16 NOTE — Patient Instructions (Signed)

## 2021-12-19 ENCOUNTER — Other Ambulatory Visit: Payer: Self-pay | Admitting: Nurse Practitioner

## 2021-12-19 DIAGNOSIS — M62838 Other muscle spasm: Secondary | ICD-10-CM

## 2021-12-27 ENCOUNTER — Other Ambulatory Visit: Payer: Self-pay | Admitting: Internal Medicine

## 2021-12-27 DIAGNOSIS — E785 Hyperlipidemia, unspecified: Secondary | ICD-10-CM | POA: Diagnosis not present

## 2021-12-27 DIAGNOSIS — E1169 Type 2 diabetes mellitus with other specified complication: Secondary | ICD-10-CM | POA: Diagnosis not present

## 2021-12-27 DIAGNOSIS — I38 Endocarditis, valve unspecified: Secondary | ICD-10-CM | POA: Diagnosis not present

## 2021-12-27 DIAGNOSIS — R252 Cramp and spasm: Secondary | ICD-10-CM | POA: Diagnosis not present

## 2021-12-27 DIAGNOSIS — E559 Vitamin D deficiency, unspecified: Secondary | ICD-10-CM | POA: Diagnosis not present

## 2021-12-27 DIAGNOSIS — Z8679 Personal history of other diseases of the circulatory system: Secondary | ICD-10-CM | POA: Diagnosis not present

## 2022-01-10 DIAGNOSIS — J449 Chronic obstructive pulmonary disease, unspecified: Secondary | ICD-10-CM | POA: Diagnosis not present

## 2022-01-16 ENCOUNTER — Other Ambulatory Visit: Payer: Self-pay | Admitting: Nurse Practitioner

## 2022-01-16 DIAGNOSIS — M62838 Other muscle spasm: Secondary | ICD-10-CM

## 2022-01-17 ENCOUNTER — Other Ambulatory Visit: Payer: Self-pay | Admitting: Internal Medicine

## 2022-01-18 DIAGNOSIS — J449 Chronic obstructive pulmonary disease, unspecified: Secondary | ICD-10-CM | POA: Diagnosis not present

## 2022-01-18 NOTE — Telephone Encounter (Signed)
Dr. Maple Hudson, please advise on refill request.   Allergies  Allergen Reactions   No Known Allergies      Current Outpatient Medications:    acetaminophen (TYLENOL) 500 MG tablet, Take 1,000 mg by mouth every 6 (six) hours as needed for fever or headache., Disp: , Rfl:    albuterol (PROVENTIL) (2.5 MG/3ML) 0.083% nebulizer solution, USE 1 VIAL IN NEBULIZER EVERY 6 HOURS - And As Needed, Disp: 120 mL, Rfl: 11   albuterol (VENTOLIN HFA) 108 (90 Base) MCG/ACT inhaler, INHALE 1 TO 2 PUFFS INTO THE LUNGS EVERY 4 HOURS AS NEEDED FOR WHEEZING OR SHORTNESS OF BREATH, Disp: 8.5 each, Rfl: 5   ALPRAZolam (XANAX) 1 MG tablet, Take 1 tablet (1 mg total) by mouth every 6 (six) hours as needed for anxiety., Disp: 120 tablet, Rfl: 5   apixaban (ELIQUIS) 5 MG TABS tablet, Take 1 tablet (5 mg total) by mouth 2 (two) times daily., Disp: 180 tablet, Rfl: 3   diltiazem (CARDIZEM CD) 120 MG 24 hr capsule, Take 1 capsule (120 mg total) by mouth daily., Disp: 90 capsule, Rfl: 3   esomeprazole (NEXIUM) 40 MG capsule, Take 40 mg by mouth daily., Disp: , Rfl:    furosemide (LASIX) 40 MG tablet, Take 40 mg by mouth 2 (two) times daily., Disp: , Rfl:    metoprolol succinate (TOPROL-XL) 25 MG 24 hr tablet, Take 25 mg by mouth daily. (Patient not taking: Reported on 12/16/2021), Disp: , Rfl:    Multiple Vitamin (MULTIVITAMIN) tablet, Take 1 tablet by mouth daily., Disp: , Rfl:    potassium chloride (MICRO-K) 10 MEQ CR capsule, Take 10 mEq by mouth 2 (two) times daily., Disp: , Rfl:    rosuvastatin (CRESTOR) 5 MG tablet, Take 1 tablet (5 mg total) by mouth daily., Disp: 90 tablet, Rfl: 3   Tiotropium Bromide Monohydrate (SPIRIVA RESPIMAT) 1.25 MCG/ACT AERS, Inhale 2 puffs into the lungs daily., Disp: 4 g, Rfl: 5   Vitamin D, Ergocalciferol, (DRISDOL) 1.25 MG (50000 UNIT) CAPS capsule, Take 50,000 Units by mouth once a week. Fridays, Disp: , Rfl:

## 2022-01-18 NOTE — Telephone Encounter (Signed)
Alprazolam refilled.  

## 2022-01-24 ENCOUNTER — Ambulatory Visit: Payer: Medicare Other | Admitting: Nurse Practitioner

## 2022-01-27 ENCOUNTER — Telehealth: Payer: Self-pay | Admitting: Physician Assistant

## 2022-01-27 DIAGNOSIS — J069 Acute upper respiratory infection, unspecified: Secondary | ICD-10-CM | POA: Diagnosis not present

## 2022-01-27 DIAGNOSIS — Z79899 Other long term (current) drug therapy: Secondary | ICD-10-CM | POA: Diagnosis not present

## 2022-01-27 DIAGNOSIS — N343 Urethral syndrome, unspecified: Secondary | ICD-10-CM | POA: Diagnosis not present

## 2022-01-27 DIAGNOSIS — M545 Low back pain, unspecified: Secondary | ICD-10-CM | POA: Diagnosis not present

## 2022-01-27 DIAGNOSIS — I4891 Unspecified atrial fibrillation: Secondary | ICD-10-CM | POA: Diagnosis not present

## 2022-01-27 DIAGNOSIS — E1169 Type 2 diabetes mellitus with other specified complication: Secondary | ICD-10-CM | POA: Diagnosis not present

## 2022-01-27 NOTE — Telephone Encounter (Signed)
Pt called because she was at her PCP office and had an episode of rapid atrial fibrillation.  Her heart rate was significantly elevated in the doctor's office.  She had gone to see her physician because of an upper respiratory infection.  She was prescribed antibiotics, which she has.  She was also given a piece of paper with flecainide 100 mg written on it, however the pharmacy says they did not get that prescription and the person on call for her PCP says they did not feel comfortable calling it in.  She is concerned because she does not have that medication.  I spoke with the patient about her symptoms, her heart rate has slowed down from the significant elevation that she had in her PCP office.  She says the range is between 70 and 90.  She says she can sort of feel it beating in her throat, but she is not having chest pain, no shortness of breath other than from her acute illness, and she is not lightheaded or dizzy.  On reviewing her medications, she at one point was prescribed Toprol-XL 25 mg to be taken at bedtime.  However, her symptoms improved and she never started this.  She does however, have the bottle.  I reviewed her symptoms and advised that she is likely to have breakthrough atrial fibrillation because of her acute illness.  We discussed the fact that her heart rate is controlled when she is at rest.  Because of her acute illness, I recommended that she take it easy and stay around the house.  She has the bottle of Toprol-XL 25 mg that she had previously been prescribed, she can take it as needed if her heart rate is poorly controlled.  Tomorrow, she can contact her PCP office and talk to them about the flecainide.  She can also contact Dr. Julien Nordmann office if she is still having problems.  She was in agreement with this plan of care.  Rosaria Ferries, PA-C 01/27/2022 7:58 PM

## 2022-01-31 ENCOUNTER — Ambulatory Visit (INDEPENDENT_AMBULATORY_CARE_PROVIDER_SITE_OTHER): Payer: Medicare Other

## 2022-01-31 ENCOUNTER — Telehealth: Payer: Self-pay | Admitting: Cardiology

## 2022-01-31 VITALS — BP 104/58 | HR 61 | Ht 66.0 in | Wt 208.0 lb

## 2022-01-31 DIAGNOSIS — I480 Paroxysmal atrial fibrillation: Secondary | ICD-10-CM

## 2022-01-31 NOTE — Telephone Encounter (Signed)
Pt c/o medication issue:  1. Name of Medication:   metoprolol succinate (TOPROL-XL) 25 MG 24 hr tablet    2. How are you currently taking this medication (dosage and times per day)? Take 25 mg by mouth daily  3. Are you having a reaction (difficulty breathing--STAT)? NO  4. What is your medication issue? Pt states that she has never taken medication since being prescribed. Pt would like to know if she needs to be taking. Please advise

## 2022-01-31 NOTE — Telephone Encounter (Signed)
Spoke with the patient who was recently started on flecainide. She had previously been prescribed Toprol but never started it. She called on-call service last week and spoke with Rosaria Ferries, PA-C. See note below:  "She has the bottle of Toprol-XL 25 mg that she had previously been prescribed, she can take it as needed if her heart rate is poorly controlled."  Advised patient that she can take Toprol as needed.  Patient verbalized understanding.  Reports that HR has been 60-80. She states that it doesn't "stay still" and this concerns her. She reports that when she checks it using a pulse ox it jumps from 72, down to 61 then back up to 75. Patient concerned that medicines aren't working. Advised patient that it is reassuring that her HR is controlled and not elevated.

## 2022-01-31 NOTE — Progress Notes (Signed)
   Nurse Visit   Date of Encounter: 01/31/2022 ID: Felicia Odom, DOB [birth year], MRN [id number]  PCP:  Nicholos Johns, MD   [facility] Providers Cardiologist:  Kirk Ruths, MD Click to update primary MD,subspecialty MD or APP then REFRESH:1}     Visit Details   VS:  BP (!) 104/58   Pulse 61   Ht '5\' 6"'$  (1.676 m)   Wt 208 lb (94.3 kg)   SpO2 97%   BMI 33.57 kg/m  , BMI Body mass index is 33.57 kg/m.  Wt Readings from Last 3 Encounters:  01/31/22 208 lb (94.3 kg)  12/16/21 203 lb 6.4 oz (92.3 kg)  10/21/21 203 lb 9.6 oz (92.4 kg)     Reason for visit: EKG due to being started on Flecanide Performed today: Vitals, EKG, Provider consulted and Education Changes (medications, testing, etc.) : Schedule nurse visit in 2 weeks for EKG. Schedule an office visit appointment with Dr. Geraldo Pitter for 1 month Length of Visit: 25 minutes    Medications Adjustments/Labs and Tests Ordered: No orders of the defined types were placed in this encounter.  No orders of the defined types were placed in this encounter.    Signed, Louie Casa, RN  01/31/2022 1:04 PM

## 2022-02-01 DIAGNOSIS — L270 Generalized skin eruption due to drugs and medicaments taken internally: Secondary | ICD-10-CM | POA: Diagnosis not present

## 2022-02-02 DIAGNOSIS — J449 Chronic obstructive pulmonary disease, unspecified: Secondary | ICD-10-CM | POA: Diagnosis not present

## 2022-02-05 ENCOUNTER — Telehealth: Payer: Self-pay | Admitting: Student

## 2022-02-05 NOTE — Telephone Encounter (Signed)
   The patient called the after hours line reporting her HR has been in the 40's to 60's and she was concerned about the low readings. Denies any lightheadedness, dizziness or presyncope. Says she was started on Prednisone for a rash by her PCP and questions if this is contributing. She is on Flecainide and Cardizem CD '120mg'$  daily (listed as taking Toprol-XL as needed but has never taken this). She did take Cardizem this morning and I recommended she check her HR tomorrow before taking this. If HR < 60, recommended holding Cardizem CD. This may ultimately need to be discontinued. Encouraged her to make [place] aware if she develops any associated symptoms. She voiced understanding of this and was appreciative of the return call. Will send to Dr. Geraldo Pitter as an Juluis Rainier.   Signed, Erma Heritage, PA-C 02/05/2022, 10:06 AM

## 2022-02-07 NOTE — Telephone Encounter (Signed)
Pt states that her heart rate has been 70's since and does not want to change at this time.

## 2022-02-14 ENCOUNTER — Ambulatory Visit (INDEPENDENT_AMBULATORY_CARE_PROVIDER_SITE_OTHER): Payer: Medicare Other

## 2022-02-14 ENCOUNTER — Other Ambulatory Visit: Payer: Self-pay

## 2022-02-14 VITALS — BP 114/72 | HR 81 | Ht 66.0 in | Wt 207.6 lb

## 2022-02-14 DIAGNOSIS — I480 Paroxysmal atrial fibrillation: Secondary | ICD-10-CM | POA: Diagnosis not present

## 2022-02-14 DIAGNOSIS — I5032 Chronic diastolic (congestive) heart failure: Secondary | ICD-10-CM | POA: Diagnosis not present

## 2022-02-14 DIAGNOSIS — I10 Essential (primary) hypertension: Secondary | ICD-10-CM | POA: Diagnosis not present

## 2022-02-14 MED ORDER — FLECAINIDE ACETATE 50 MG PO TABS: 50.0000 mg | ORAL_TABLET | Freq: Two times a day (BID) | ORAL | 3 refills | Status: DC

## 2022-02-14 MED ORDER — APIXABAN 5 MG PO TABS: 5.0000 mg | ORAL_TABLET | Freq: Two times a day (BID) | ORAL | 3 refills | Status: DC

## 2022-02-14 MED ORDER — DILTIAZEM HCL ER COATED BEADS 120 MG PO CP24: 120.0000 mg | ORAL_CAPSULE | Freq: Every day | ORAL | 3 refills | Status: DC

## 2022-02-14 NOTE — Progress Notes (Signed)
   Nurse Visit   Date of Encounter: 02/14/2022 ID: Felicia Odom, DOB May 21, 1956, MRN [id number]  PCP:  Nicholos Johns, MD   [facility] Providers Cardiologist:  Kirk Ruths, MD      Visit Details   VS:  BP 114/72   Pulse 81   Ht '5\' 6"'$  (1.676 m)   Wt 207 lb 9.6 oz (94.2 kg)   SpO2 95%   BMI 33.51 kg/m  , BMI Body mass index is 33.51 kg/m.  Wt Readings from Last 3 Encounters:  02/14/22 207 lb 9.6 oz (94.2 kg)  01/31/22 208 lb (94.3 kg)  12/16/21 203 lb 6.4 oz (92.3 kg)     Reason for visit: 2 week follow up on Flecainide Performed today:   , Vitals, EKG, Provider consulted:Dr. Bettina Gavia, and Education Changes (medications, testing, etc.) : Continue medications. Chart pt not taking Metoprolol. Zio x 7 days. Refill Flecainide, Cardizem and Eliquis Length of Visit: 20 minutes    Medications Adjustments/Labs and Tests Ordered: Orders Placed This Encounter  Procedures   LONG TERM MONITOR (3-14 DAYS)   EKG 12-Lead   No orders of the defined types were placed in this encounter.    Signed, Tyler Pita, RN  02/14/2022 9:50 AM

## 2022-02-23 ENCOUNTER — Emergency Department (HOSPITAL_COMMUNITY)
Admission: EM | Admit: 2022-02-23 | Discharge: 2022-02-23 | Disposition: A | Discharge status: Home / Self Care | Payer: Medicare Other | Attending: Emergency Medicine | Admitting: Emergency Medicine

## 2022-02-23 ENCOUNTER — Other Ambulatory Visit: Payer: Self-pay

## 2022-02-23 ENCOUNTER — Telehealth: Payer: Self-pay | Admitting: Cardiology

## 2022-02-23 ENCOUNTER — Emergency Department (HOSPITAL_COMMUNITY): Payer: Medicare Other

## 2022-02-23 DIAGNOSIS — R072 Precordial pain: Secondary | ICD-10-CM | POA: Diagnosis present

## 2022-02-23 DIAGNOSIS — R0789 Other chest pain: Secondary | ICD-10-CM | POA: Diagnosis not present

## 2022-02-23 DIAGNOSIS — Z7901 Long term (current) use of anticoagulants: Secondary | ICD-10-CM | POA: Insufficient documentation

## 2022-02-23 DIAGNOSIS — R079 Chest pain, unspecified: Secondary | ICD-10-CM

## 2022-02-23 DIAGNOSIS — I4891 Unspecified atrial fibrillation: Secondary | ICD-10-CM | POA: Insufficient documentation

## 2022-02-23 DIAGNOSIS — R404 Transient alteration of awareness: Secondary | ICD-10-CM | POA: Diagnosis not present

## 2022-02-23 DIAGNOSIS — R600 Localized edema: Secondary | ICD-10-CM | POA: Insufficient documentation

## 2022-02-23 DIAGNOSIS — I499 Cardiac arrhythmia, unspecified: Secondary | ICD-10-CM | POA: Diagnosis not present

## 2022-02-23 DIAGNOSIS — Z743 Need for continuous supervision: Secondary | ICD-10-CM | POA: Diagnosis not present

## 2022-02-23 DIAGNOSIS — R0602 Shortness of breath: Secondary | ICD-10-CM | POA: Diagnosis not present

## 2022-02-23 DIAGNOSIS — R1110 Vomiting, unspecified: Secondary | ICD-10-CM | POA: Diagnosis not present

## 2022-02-23 LAB — BASIC METABOLIC PANEL
Anion gap: 7 (ref 5–15)
BUN: 16 mg/dL (ref 8–23)
CO2: 26 mmol/L (ref 22–32)
Calcium: 8.9 mg/dL (ref 8.9–10.3)
Chloride: 106 mmol/L (ref 98–111)
Creatinine, Ser: 0.77 mg/dL (ref 0.44–1.00)
GFR, Estimated: >60 mL/min (ref >60)
Glucose, Bld: 136 mg/dL — ABNORMAL HIGH (ref 70–99)
Potassium: 3.4 mmol/L — ABNORMAL LOW (ref 3.5–5.1)
Sodium: 139 mmol/L (ref 135–145)

## 2022-02-23 LAB — PROTIME-INR
INR: 1 (ref 0.8–1.2)
Prothrombin Time: 12.8 seconds (ref 11.4–15.2)

## 2022-02-23 LAB — TROPONIN I (HIGH SENSITIVITY)
Troponin I (High Sensitivity): 6 ng/L (ref <18)
Troponin I (High Sensitivity): 7 ng/L (ref <18)

## 2022-02-23 LAB — MAGNESIUM: Magnesium: 2.1 mg/dL (ref 1.7–2.4)

## 2022-02-23 LAB — BRAIN NATRIURETIC PEPTIDE: B Natriuretic Peptide: 31.9 pg/mL (ref 0.0–100.0)

## 2022-02-23 MED ORDER — DILTIAZEM HCL 60 MG PO TABS: 60.0000 mg | ORAL_TABLET | ORAL | 0 refills | Status: DC | PRN

## 2022-02-23 NOTE — Telephone Encounter (Signed)
Spoke with pt who states that she is at [facility] ED and is going to be admitted. Advised pt that I would let Dr. Geraldo Pitter know and that we will check on her.

## 2022-02-23 NOTE — ED Notes (Signed)
Pt took a walk around the room and stated she felt fine and not as dizzy as she did when she called 911. Pts heart rate was steady and consistent in the mid 80's. O2 never dropped below 97% while she was walking around the room. The patient wished to be left sitting up in the bed.

## 2022-02-23 NOTE — Discharge Instructions (Addendum)
You were seen in the emergency department for chest pain radiating to arm and neck and rapid heart rate.  Your atrial fibrillation was in a rapid rate and you received some medications.  Your heart rate is back in sinus rhythm currently.  Dr. Harl Bowie cardiology is recommending you continue your regular medications and we are sending a prescription to the pharmacy for an extra dose of Cardizem if you have another episode of elevated heart rate.  Please follow-up with your cardiologist as scheduled.  Return to the emergency department if any worsening or concerning symptoms

## 2022-02-23 NOTE — ED Triage Notes (Signed)
Pt BIB EMS from home. Pt felt like she was going to pass out. EMS noted pt otp be in afib rvr with a HR of 200's with pauses. Pt is axox4. VSS.

## 2022-02-23 NOTE — ED Notes (Signed)
Received verbal report from Victoria G RN at this time 

## 2022-02-23 NOTE — Consult Note (Signed)
Cardiology Admission History and Physical:   Patient ID: Felicia Odom MRN: [id number]; DOB: November 10, 1955   Admission date: 02/23/2022  PCP:  Felicia Johns, Odom   [facility] Providers Cardiologist:  Felicia Lindau, Odom   {  Chief Complaint:  chest pain  Patient Profile:   Felicia Odom is a 66 y.o. female with PMH of aortic atherosclerosis, paroxysmal atrial fibrillation, dyslipidemia, obesity, anxiety, chronic back pain, asthma/COPD, former tobacco abuse, history of marijuana and cocaine abuse, who is being seen 02/23/2022 for Felicia evaluation of A-fib RVR.   History of Present Illness:   Felicia Odom above past medical history follows Dr. Geraldo Odom for paroxysmal atrial fibrillation, historically managed on flecainide 50 mg twice daily, diltiazem 120 mg daily, and Eliquis 5 mg twice daily.  She had history of atypical chest pain in Felicia past.  CT in Felicia past revealed aortic atherosclerosis.  Last stress Myoview was done 10/19/2021 which was low risk , LVEF 76%, no abnormal finding.  Echo from 07/01/21 showed LVEF 60-65%, grade I DD, normal RV, rheumatic mitral valve, mild MR, trivial AR, mild aortic stenosis with mean gradient 10 mmHg. She was last seen by Dr. Geraldo Odom in Felicia office 12/16/2021, was doing well without any complaints.  She notes prior to her symptoms today she was doing well. She denies significant DOE, no chest pressure, no LE edema. Her weights are stable around 202-208.  Patient presented to [place] ER today complaining rapid heart rate, associated with chest pain, shortness of breath, and dizziness.  Symptoms started around 1 PM today.  She recalls having 10/10 chest pain, radiating to her neck and left shoulder.  She recalls feeling significantly dizzy that she almost passed out.  Admission diagnostic so far revealed hypokalemia 3.4, otherwise unremarkable BMP.  High sensitive troponin 6.  INR 1.  Chest x-ray showed no acute finding. EKG revealed A-fib with RVR with  ventricular rate 112 bmp. She was given IV Cardizem by EMS, rate controlled improved but had 2-3 second pauses noted on telemetry. Cardiology is consulted on this matter.   She converted to NSR around 5:06 PM.  Past Medical History:  Diagnosis Date   Allergic rhinitis 10/01/2010   Qualifier: Diagnosis of  By: Felicia Odom, Clinton D    Anxiety    Aortic atherosclerosis ([place]) 10/06/2021   Arthritis    neck   Atherosclerosis of aorta ([place]) 05/08/2013   CXR 10/115/14   Atrial fibrillation with RVR ([place]) 09/24/2021   Atypical chest pain 05/08/2013   Angina vs esophagitis   Cannabis dependence with current use ([place]) 07/26/2016   Chest pain of uncertain etiology 8/52/7782   Chronic back pain    Chronic bronchitis (HCC)    Chronic diastolic CHF (congestive heart failure) ([place])    Chronic neck pain    Cocaine abuse ([place]) 04/05/2021   COPD mixed type ([place]) 02/18/2019   Office Spirometry 10/21/2015-slight restriction of exhaled volume, mild obstruction. FVC 2.69/78%, FEV1 2.04/76%, FEV1/FVC 0.76, FEF 25-75 percent 1.68/65% PFT 04/27/2016-minimal obstructive airways disease, minimal diffusion defect, insignificant response to bronchodilator. FVC 2.79/78%, FEV1 2.11/76%, ratio 0.76, FEF 25-75 percent 1.81/72%, TLC 92%, DLCO 76%.   DDD (degenerative disc disease), cervical 06/15/2015   Diverticulosis    Eczema 11/29/2017   Encounter for long-term opiate analgesic use 07/06/2017   Essential hypertension 09/25/2015   Former tobacco use 10/01/2018   Former smoker Quit 2017 36-pack-year smoking history  07/12/2018-lung cancer screening CT-lung RADS 1   GERD (gastroesophageal reflux disease)  takes Nexium daily   Helicobacter pylori gastritis 08/2019   Tx and eradicated   History of blood transfusion    in Felicia 70's. No abnormal reaction noted   Hyperlipidemia    not on any meds bc refused to pick up from pharmacy   Hyperlipidemia with target LDL less than 100 01/21/2015   Felicia 10-year ASCVD risk score Felicia Bussing DC  Jr., et al., 2013) is: 10.4%   Values used to calculate Felicia score:     Age: 61 years     Sex: Female     Is Non-Hispanic African American: No     Diabetic: Yes     Tobacco smoker: No     Systolic Blood Pressure: 540 mmHg     Is BP treated: No     HDL Cholesterol: 45.1 mg/dL     Total Cholesterol: 200 mg/dL   Joint pain    Joint swelling    Long-term current use of opiate analgesic 04/05/2021   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/08/2021   Mediastinal tumor    Moderate episode of recurrent major depressive disorder ([place]) 01/28/2020   Muscle spasm 08/02/2021   Nocturia    Obsessive-compulsive disorder 06/08/2007   Qualifier: Diagnosis of  By: June Leap     PANIC DISORDER 06/08/2007   Qualifier: Diagnosis of  By: June Leap     Pneumonia    2016   PONV (postoperative nausea and vomiting)    Primary osteoarthritis involving multiple joints 01/21/2015   Right calf pain    Routine general medical examination at a health care facility 01/28/2020   Seborrheic dermatitis of scalp 02/11/2015   Seizures ([place])    in Felicia 80's   Thyroid nodule 07/30/2020   IMPRESSION: No significant interval change of bilateral thyroid nodules.   Felicia above is in keeping with Felicia ACR TI-RADS recommendations - Felicia Odom 2017;[id number].     Electronically Signed   By: Felicia Roux M.D.   On: 08/17/2020 14:18   Type II diabetes mellitus with manifestations ([place]) 02/18/2019   Felicia 10-year ASCVD risk score Felicia Bussing DC Jr., et al., 2013) is: 9.1%   Values used to calculate Felicia score:     Age: 31 years     Sex: Female     Is Non-Hispanic African American: No     Diabetic: Yes     Tobacco smoker: No     Systolic Blood Pressure: 086 mmHg     Is BP treated: No     HDL Cholesterol: 46.2 mg/dL     Total Cholesterol: 180 mg/dL   URI (upper respiratory infection) 07/08/2021    Past Surgical History:  Procedure Laterality Date   COLONOSCOPY     ECTOPIC PREGNANCY SURGERY     x 2    ESOPHAGOGASTRODUODENOSCOPY     LAPAROSCOPIC LYSIS INTESTINAL  ADHESIONS     x 2   RESECTION OF MEDIASTINAL MASS N/A 06/10/2016   Procedure: RESECTION OF MEDIASTINAL MASS;  Surgeon: Grace Isaac, Odom;  Location: [facility];  Service: Thoracic;  Laterality: N/A;   VIDEO ASSISTED THORACOSCOPY Right 06/10/2016   Procedure: VIDEO ASSISTED THORACOSCOPY WITH PLACEMENT OF ON Verdie Mosher;  Surgeon: Grace Isaac, Odom;  Location: [facility];  Service: Thoracic;  Laterality: Right;   VIDEO BRONCHOSCOPY N/A 06/10/2016   Procedure: VIDEO BRONCHOSCOPY;  Surgeon: Grace Isaac, Odom;  Location: [facility];  Service: Thoracic;  Laterality: N/A;     Medications Prior to Admission: Prior to Admission medications  Medication Sig Start Date End Date Taking? Authorizing Provider  acetaminophen (TYLENOL) 500 MG tablet Take 1,000 mg by mouth every 6 (six) hours as needed for fever or headache.    Provider, Historical, Odom  albuterol (PROVENTIL) (2.5 MG/3ML) 0.083% nebulizer solution USE 1 VIAL IN NEBULIZER EVERY 6 HOURS - And As Needed Patient taking differently: Take 2.5 mg by nebulization every 6 (six) hours as needed for shortness of breath or wheezing. USE 1 VIAL IN NEBULIZER EVERY 6 HOURS - And As Needed 03/06/21   Baird Lyons D, Odom  albuterol (VENTOLIN HFA) 108 (90 Base) MCG/ACT inhaler INHALE 1 TO 2 PUFFS INTO Felicia LUNGS EVERY 4 HOURS AS NEEDED FOR WHEEZING OR SHORTNESS OF BREATH Patient taking differently: Inhale 2 puffs into Felicia lungs every 6 (six) hours as needed for wheezing or shortness of breath. INHALE 1 TO 2 PUFFS INTO Felicia LUNGS EVERY 4 HOURS AS NEEDED FOR WHEEZING OR SHORTNESS OF BREATH 12/02/21   Young, Kasandra Knudsen, Odom  ALPRAZolam (XANAX) 1 MG tablet TAKE 1 TABLET BY MOUTH EVERY 6 HOURS AS NEEDED FOR ANXIETY. 01/18/22   Young, Kasandra Knudsen, Odom  apixaban (ELIQUIS) 5 MG TABS tablet Take 1 tablet (5 mg total) by mouth 2 (two) times daily. 02/14/22   Revankar, Reita Cliche, Odom  diltiazem (CARDIZEM CD) 120 MG 24 hr capsule Take 1 capsule (120 mg total) by mouth daily. 02/14/22    Revankar, Reita Cliche, Odom  esomeprazole (NEXIUM) 40 MG capsule Take 40 mg by mouth daily as needed (Acid reflux). 11/01/21   Provider, Historical, Odom  flecainide (TAMBOCOR) 50 MG tablet Take 1 tablet (50 mg total) by mouth every 12 (twelve) hours. 02/14/22   Revankar, Reita Cliche, Odom  furosemide (LASIX) 40 MG tablet Take 40 mg by mouth 2 (two) times daily. 12/02/21   Provider, Historical, Odom  Multiple Vitamin (MULTIVITAMIN) tablet Take 1 tablet by mouth daily.    Provider, Historical, Odom  potassium chloride (MICRO-K) 10 MEQ CR capsule Take 10 mEq by mouth 2 (two) times daily. 12/02/21   Provider, Historical, Odom  rosuvastatin (CRESTOR) 5 MG tablet Take 1 tablet (5 mg total) by mouth daily. 11/01/21   Revankar, Reita Cliche, Odom  Tiotropium Bromide Monohydrate (SPIRIVA RESPIMAT) 1.25 MCG/ACT AERS Inhale 2 puffs into Felicia lungs daily. Patient taking differently: Inhale 2 puffs into Felicia lungs daily as needed (SOB or wheezing). 10/27/21   Deneise Lever, Odom  valACYclovir (VALTREX) 1000 MG tablet Take 4,000 mg by mouth once. 02/21/22   Provider, Historical, Odom  Vitamin D, Ergocalciferol, (DRISDOL) 1.25 MG (50000 UNIT) CAPS capsule Take 50,000 Units by mouth once a week. Fridays 06/21/21   Provider, Historical, Odom  progesterone (PROMETRIUM) 100 MG capsule Take 100 mg by mouth daily.  10/06/11  Provider, Historical, Odom     Allergies:    Allergies  Allergen Reactions   No Known Allergies     Social History:   Social History   Socioeconomic History   Marital status: Divorced    Spouse name: Not on file   Number of children: 1   Years of education: Not on file   Highest education level: Not on file  Occupational History   Occupation: retired  Tobacco Use   Smoking status: Former    Packs/day: 1.00    Years: 36.00    Total pack years: 36.00    Types: Cigarettes    Quit date: 2017    Years since quitting: 6.5   Smokeless tobacco: Never  Tobacco comments:    last cigarette 2017  Vaping Use   Vaping Use:  Never used  Substance and Sexual Activity   Alcohol use: No    Alcohol/week: 0.0 standard drinks of alcohol   Drug use: No   Sexual activity: Not on file  Other Topics Concern   Not on file  Social History Narrative   Retired [employer]   Divorced   1 son   Former smoker   2 coffee/daty   No EtOH, no drugs   Social Determinants of Radio broadcast assistant Strain: Not on file  Food Insecurity: Not on file  Transportation Needs: Not on file  Physical Activity: Not on file  Stress: Not on file  Social Connections: Not on file  Intimate Partner Violence: Not on file    Odom History:   Felicia Odom history includes Breast cancer in her sister; Clotting disorder in her mother; Colon cancer in her sister; Diabetes in her mother; Heart attack in her father; Heart failure in her father and mother; Kidney disease in her mother; Prostate cancer in her father. There is no history of Esophageal cancer, Rectal cancer, or Stomach cancer.    ROS:  Please see Felicia history of present illness.  All other ROS reviewed and negative.     Physical Exam/Data:   Vitals:   02/23/22 1630 02/23/22 1645 02/23/22 1700 02/23/22 1745  BP: (!) 135/94 (!) 131/99 119/73 114/74  Pulse: (!) 107 (!) 112 66 76  Resp: (!) 25 (!) 25 (!) 21 19  Temp:      TempSrc:      SpO2: 98% 97% 98% 100%   No intake or output data in Felicia 24 hours ending 02/23/22 1812    02/14/2022    9:10 AM 01/31/2022   11:15 AM 12/16/2021    8:04 AM  Last 3 Weights  Weight (lbs) 207 lb 9.6 oz 208 lb 203 lb 6.4 oz  Weight (kg) 94.167 kg 94.348 kg 92.262 kg     There is no height or weight on file to calculate BMI.  General:  Well nourished, well developed, in no acute distress. Can lie flat without symptoms.  HEENT: normal Neck: no JVD Vascular: No carotid bruits; Distal pulses 2+ bilaterally   Cardiac:  normal S1, S2; RRR; no murmur  Lungs: Removed [place], > 90% on RA,  clear to auscultation bilaterally, no  wheezing, rhonchi or rales  Abd: soft, nontender, no hepatomegaly  Ext: no edema Musculoskeletal:  No deformities, BUE and BLE strength normal and equal Skin: warm and dry  Neuro:  CNs 2-12 intact, no focal abnormalities noted Psych:  Normal affect    EKG:    EKG 02/23/22 1630 with A fib RVR 112 bpm EKG 02/23/22 1714 with SR 80 bpm  Relevant CV Studies:    Gated Myoview Lexiscan 10/19/2021:    Felicia study is normal. Felicia study is low risk.   Left ventricular function is normal. Nuclear stress EF: 76 %. Felicia left ventricular ejection fraction is hyperdynamic (>65%). End diastolic cavity size is normal.   Good exercise capacity.  Patient had leg pain which is why Felicia test was stopped and changed to [place].  Echocardiogram 07/01/2021:   1. Left ventricular ejection fraction, by estimation, is 60 to 65%. Felicia  left ventricle has normal function. Felicia left ventricle has no regional  wall motion abnormalities. Left ventricular diastolic parameters are  consistent with Grade I diastolic  dysfunction (impaired relaxation). Elevated  left atrial pressure.   2. Right ventricular systolic function is normal. Felicia right ventricular  size is normal.   3. 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 Mild mitral valve regurgitation. No  evidence of mitral stenosis. Felicia mean mitral valve gradient is 3.1 mmHg.   4. Felicia aortic valve has an indeterminant number of cusps. There is mild  calcification of Felicia aortic valve. There is mild thickening of Felicia aortic  valve. Aortic valve regurgitation is trivial. Mild aortic valve stenosis.  Aortic valve mean gradient  measures 10.0 mmHg. Aortic valve Vmax measures 2.08 m/s.   5. Felicia inferior vena cava is normal in size with greater than 50%  respiratory variability, suggesting right atrial pressure of 3 mmHg.   Comparison(s): No significant change from prior study. Prior images  reviewed side by side.   Laboratory Data:  High Sensitivity Troponin:   Recent Labs  Lab  02/23/22 1602  TROPONINIHS 6      Chemistry Recent Labs  Lab 02/23/22 1602  NA 139  K 3.4*  CL 106  CO2 26  GLUCOSE 136*  BUN 16  CREATININE 0.77  CALCIUM 8.9  MG 2.1  GFRNONAA >60  ANIONGAP 7    No results for input(s): "PROT", "ALBUMIN", "AST", "ALT", "ALKPHOS", "BILITOT" in Felicia last 168 hours. Lipids No results for input(s): "CHOL", "TRIG", "HDL", "LABVLDL", "LDLCALC", "CHOLHDL" in Felicia last 168 hours. HematologyNo results for input(s): "WBC", "RBC", "HGB", "HCT", "MCV", "MCH", "MCHC", "RDW", "PLT" in Felicia last 168 hours. Thyroid No results for input(s): "TSH", "FREET4" in Felicia last 168 hours. BNP Recent Labs  Lab 02/23/22 1617  BNP 31.9    DDimer No results for input(s): "DDIMER" in Felicia last 168 hours.   Radiology/Studies:  DG Chest Port 1 View  Result Date: 02/23/2022 CLINICAL DATA:  Chest pain and shortness of breath. EXAM: PORTABLE CHEST 1 VIEW COMPARISON:  Chest radiograph 08/02/2021 FINDINGS: Cardiac pads overlying Felicia left chest. Slightly prominent lung markings and similar to Felicia previous examination. Mild blunting at Felicia left costophrenic angle appears unchanged. Heart size is normal. No overt pulmonary edema. Negative for a pneumothorax. No acute bone abnormality. IMPRESSION: No acute chest findings. Electronically Signed   By: Markus Daft M.D.   On: 02/23/2022 16:18     Assessment and Plan:   Paroxysmal A-fib with RVR -Historically maintained on flecainide 50 mg twice daily and diltiazem 120 mg daily -Presented with chest pain, shortness of breath, irregular heartbeat, dizziness starting 1 PM on 02/23/2022 - Received IV diltiazem bolus by EMS, has converted back to sinus rhythm now - Recommend continue home diltiazem 120 mg daily and flecainide 50 mg BID. - Continue anticoagulation with Eliquis - recommend adding diltiazem 60 mg q4H PRN for palpitations  Sinus Pause: up to 4 seconds. This is not persistent. No indication for PPM or further monitoring at this  time.  Chest discomfort -Hs troponin negative x1, will trend -Negative stress Myoview 09/2021 -Suspecting discomfort is from A-fib RVR   With restoration of sinus rhythm and no signs of CHF or ACS, recommend she can be discharge home. She sees Dr. Geraldo Odom 03/10/2022.   Risk Assessment/Risk Scores:   CHA2DS2-VASc Score = 2  This indicates a 2.2% annual risk of stroke. Felicia patient's score is based upon: CHF History: 0 HTN History: 0 Diabetes History: 0 Stroke History: 0 Vascular Disease History: 0 Age Score: 1 Gender Score: 1        For questions or updates, please contact [place] Please consult  [email or web address] for contact info under     Signed, Margie Billet, NP  02/23/2022 6:12 PM

## 2022-02-23 NOTE — ED Provider Notes (Signed)
[facility] EMERGENCY DEPARTMENT Provider Note   CSN: [id number] Arrival date & time: 02/23/22  1548     History {Add pertinent medical, surgical, social history, OB history to HPI:1} No chief complaint on file.   Felicia Odom is a 66 y.o. female.  She has a history of A-fib diabetes and is on anticoagulation and flecainide.  Complaining of cute onset of rapid heart rate chest pain shortness of breath started around 1 PM today at rest.  Pain radiates into her neck and her left shoulder.  Associated with shortness of breath and feeling dizzy.  EMS gave some diltiazem.  Patient still endorses 10 out of 10 pain.  She said she has had this before and needed to be admitted for it.  Follows with Dr. Geraldo Pitter cardiology.  She said she has been eating and drinking well denies any abdominal pain nausea vomiting diarrhea or urinary symptoms.  The history is provided by the patient and the EMS personnel.  Chest Pain Pain location:  Substernal area Pain quality: throbbing   Pain radiates to:  Neck and L shoulder Pain severity:  Severe Onset quality:  Sudden Duration:  3 hours Timing:  Constant Progression:  Unchanged Chronicity:  Recurrent Context: at rest   Relieved by:  Nothing Worsened by:  Nothing Ineffective treatments:  None tried Associated symptoms: dizziness, palpitations and shortness of breath   Associated symptoms: no abdominal pain, no cough, no diaphoresis, no fever, no nausea and no vomiting        Home Medications Prior to Admission medications   Medication Sig Start Date End Date Taking? Authorizing Provider  acetaminophen (TYLENOL) 500 MG tablet Take 1,000 mg by mouth every 6 (six) hours as needed for fever or headache.    [provider]  albuterol (PROVENTIL) (2.5 MG/3ML) 0.083% nebulizer solution USE 1 VIAL IN NEBULIZER EVERY 6 HOURS - And As Needed Patient taking differently: Take 2.5 mg by nebulization every 6 (six) hours as needed  for shortness of breath or wheezing. USE 1 VIAL IN NEBULIZER EVERY 6 HOURS - And As Needed 03/06/21   Baird Lyons D, MD  albuterol (VENTOLIN HFA) 108 (90 Base) MCG/ACT inhaler INHALE 1 TO 2 PUFFS INTO THE LUNGS EVERY 4 HOURS AS NEEDED FOR WHEEZING OR SHORTNESS OF BREATH Patient taking differently: Inhale 2 puffs into the lungs every 6 (six) hours as needed for wheezing or shortness of breath. INHALE 1 TO 2 PUFFS INTO THE LUNGS EVERY 4 HOURS AS NEEDED FOR WHEEZING OR SHORTNESS OF BREATH 12/02/21   Young, Kasandra Knudsen, MD  ALPRAZolam (XANAX) 1 MG tablet TAKE 1 TABLET BY MOUTH EVERY 6 HOURS AS NEEDED FOR ANXIETY. 01/18/22   Young, Kasandra Knudsen, MD  apixaban (ELIQUIS) 5 MG TABS tablet Take 1 tablet (5 mg total) by mouth 2 (two) times daily. 02/14/22   Revankar, Reita Cliche, MD  diltiazem (CARDIZEM CD) 120 MG 24 hr capsule Take 1 capsule (120 mg total) by mouth daily. 02/14/22   Revankar, Reita Cliche, MD  esomeprazole (NEXIUM) 40 MG capsule Take 40 mg by mouth daily as needed (Acid reflux). 11/01/21   [provider]  flecainide (TAMBOCOR) 50 MG tablet Take 1 tablet (50 mg total) by mouth every 12 (twelve) hours. 02/14/22   Revankar, Reita Cliche, MD  furosemide (LASIX) 40 MG tablet Take 40 mg by mouth 2 (two) times daily. 12/02/21   [provider]  Multiple Vitamin (MULTIVITAMIN) tablet Take 1 tablet by mouth daily.  [provider]  potassium chloride (MICRO-K) 10 MEQ CR capsule Take 10 mEq by mouth 2 (two) times daily. 12/02/21   [provider]  rosuvastatin (CRESTOR) 5 MG tablet Take 1 tablet (5 mg total) by mouth daily. 11/01/21   Revankar, Reita Cliche, MD  Tiotropium Bromide Monohydrate (SPIRIVA RESPIMAT) 1.25 MCG/ACT AERS Inhale 2 puffs into the lungs daily. Patient taking differently: Inhale 2 puffs into the lungs daily as needed (SOB or wheezing). 10/27/21   Deneise Lever, MD  Vitamin D, Ergocalciferol, (DRISDOL) 1.25 MG (50000 UNIT) CAPS capsule Take 50,000 Units by mouth once a week.  Fridays 06/21/21   [provider]  progesterone (PROMETRIUM) 100 MG capsule Take 100 mg by mouth daily.  10/06/11  [provider]      Allergies    No known allergies    Review of Systems   Review of Systems  Constitutional:  Negative for diaphoresis and fever.  HENT:  Negative for sore throat.   Eyes:  Negative for visual disturbance.  Respiratory:  Positive for shortness of breath. Negative for cough.   Cardiovascular:  Positive for chest pain and palpitations.  Gastrointestinal:  Negative for abdominal pain, nausea and vomiting.  Genitourinary:  Negative for dysuria.  Skin:  Negative for rash.  Neurological:  Positive for dizziness.    Physical Exam Updated Vital Signs BP 133/76   Pulse (!) 128   Temp (!) 97.3 F (36.3 C) (Oral)   Resp (!) 24   SpO2 98%  Physical Exam Vitals and nursing note reviewed.  Constitutional:      General: She is not in acute distress.    Appearance: Normal appearance. She is well-developed.  HENT:     Head: Normocephalic and atraumatic.  Eyes:     Conjunctiva/sclera: Conjunctivae normal.  Cardiovascular:     Rate and Rhythm: Tachycardia present. Rhythm irregular.     Pulses: Normal pulses.     Heart sounds: No murmur heard. Pulmonary:     Effort: Pulmonary effort is normal. No respiratory distress.     Breath sounds: Normal breath sounds.  Abdominal:     Palpations: Abdomen is soft.     Tenderness: There is no abdominal tenderness. There is no guarding or rebound.  Musculoskeletal:        General: Normal range of motion.     Cervical back: Neck supple.     Right lower leg: Edema present.     Left lower leg: Edema present.  Skin:    General: Skin is warm and dry.     Capillary Refill: Capillary refill takes less than 2 seconds.  Neurological:     General: No focal deficit present.     Mental Status: She is alert.     ED Results / Procedures / Treatments   Labs (all labs ordered are listed, but only abnormal  results are displayed) Labs Reviewed - No data to display  EKG None  Radiology No results found.  Procedures Procedures  {Document cardiac monitor, telemetry assessment procedure when appropriate:1}  Medications Ordered in ED Medications - No data to display  ED Course/ Medical Decision Making/ A&P                           Medical Decision Making Amount and/or Complexity of Data Reviewed Labs: ordered. Radiology: ordered.  This patient complains of ***; this involves an extensive number of treatment Options and is a complaint that carries with  it a high risk of complications and morbidity. The differential includes ***  I ordered, reviewed and interpreted labs, which included *** I ordered medication *** and reviewed PMP when indicated. I ordered imaging studies which included *** and I independently    visualized and interpreted imaging which showed *** Additional history obtained from *** Previous records obtained and reviewed *** I consulted *** and discussed lab and imaging findings and discussed disposition.  Cardiac monitoring reviewed, *** Social determinants considered, *** Critical Interventions: ***  After the interventions stated above, I reevaluated the patient and found *** Admission and further testing considered, ***    {Document critical care time when appropriate:1} {Document review of labs and clinical decision tools ie heart score, Chads2Vasc2 etc:1}  {Document your independent review of radiology images, and any outside records:1} {Document your discussion with family members, caretakers, and with consultants:1} {Document social determinants of health affecting pt's care:1} {Document your decision making why or why not admission, treatments were needed:1} Final Clinical Impression(s) / ED Diagnoses Final diagnoses:  None    Rx / DC Orders ED Discharge Orders     None

## 2022-02-23 NOTE — Telephone Encounter (Signed)
Patient c/o Palpitations:  High priority if patient c/o lightheadedness, shortness of breath, or chest pain  How long have you had palpitations/irregular HR/ Afib? Are you having the symptoms now? Not having symptoms Are you currently experiencing lightheadedness, SOB or CP? No  Do you have a history of afib (atrial fibrillation) or irregular heart rhythm? Yes  Have you checked your BP or HR? (document readings if available): Pt states that pulse went from 134 to 60 then 60 to 69 and now it's at 70. States it isn't staying the same more than 20 seconds   Are you experiencing any other symptoms? No   Pt also states that she just turned her Zio Heart monitor on 07/31, but only had to push button 3 times.

## 2022-02-23 NOTE — ED Notes (Signed)
Pt converted to NSR

## 2022-02-24 DIAGNOSIS — I480 Paroxysmal atrial fibrillation: Secondary | ICD-10-CM | POA: Diagnosis not present

## 2022-02-25 DIAGNOSIS — L0292 Furuncle, unspecified: Secondary | ICD-10-CM | POA: Diagnosis not present

## 2022-03-08 DIAGNOSIS — J449 Chronic obstructive pulmonary disease, unspecified: Secondary | ICD-10-CM | POA: Diagnosis not present

## 2022-03-10 ENCOUNTER — Encounter: Payer: Self-pay | Admitting: Cardiology

## 2022-03-10 ENCOUNTER — Ambulatory Visit (INDEPENDENT_AMBULATORY_CARE_PROVIDER_SITE_OTHER): Payer: Medicare Other | Admitting: Cardiology

## 2022-03-10 VITALS — BP 124/68 | HR 66 | Ht 66.6 in | Wt 200.2 lb

## 2022-03-10 DIAGNOSIS — I700 Atherosclerosis of aorta: Secondary | ICD-10-CM

## 2022-03-10 DIAGNOSIS — I480 Paroxysmal atrial fibrillation: Secondary | ICD-10-CM

## 2022-03-10 DIAGNOSIS — I10 Essential (primary) hypertension: Secondary | ICD-10-CM | POA: Diagnosis not present

## 2022-03-10 DIAGNOSIS — E785 Hyperlipidemia, unspecified: Secondary | ICD-10-CM

## 2022-03-10 DIAGNOSIS — I350 Nonrheumatic aortic (valve) stenosis: Secondary | ICD-10-CM | POA: Diagnosis not present

## 2022-03-10 NOTE — Patient Instructions (Signed)
Medication Instructions:  °Your physician recommends that you continue on your current medications as directed. Please refer to the Current Medication list given to you today. ° °*If you need a refill on your cardiac medications before your next appointment, please call your pharmacy* ° ° °Lab Work: °None ordered °If you have labs (blood work) drawn today and your tests are completely normal, you will receive your results only by: °MyChart Message (if you have MyChart) OR °A paper copy in the mail °If you have any lab test that is abnormal or we need to change your treatment, we will call you to review the results. ° ° °Testing/Procedures: °Your physician has requested that you have an echocardiogram. Echocardiography is a painless test that uses sound waves to create images of your heart. It provides your doctor with information about the size and shape of your heart and how well your heart’s chambers and valves are working. This procedure takes approximately one hour. There are no restrictions for this procedure. ° ° ° °Follow-Up: °At [facility], you and your health needs are our priority.  As part of our continuing mission to provide you with exceptional heart care, we have created designated Provider Care Teams.  These Care Teams include your primary Cardiologist (physician) and Advanced Practice Providers (APPs -  Physician Assistants and Nurse Practitioners) who all work together to provide you with the care you need, when you need it. ° °We recommend signing up for the patient portal called "MyChart".  Sign up information is provided on this After Visit Summary.  MyChart is used to connect with patients for Virtual Visits (Telemedicine).  Patients are able to view lab/test results, encounter notes, upcoming appointments, etc.  Non-urgent messages can be sent to your provider as well.   °To learn more about what you can do with MyChart, go to [email or web address].   ° °Your next appointment:   °6  month(s) ° °The format for your next appointment:   °In Person ° °Provider:   °Rajan Revankar, MD ° ° °Other Instructions °Echocardiogram °An echocardiogram is a test that uses sound waves (ultrasound) to produce images of the heart. °Images from an echocardiogram can provide important information about: °Heart size and shape. °The size and thickness and movement of your heart's walls. °Heart muscle function and strength. °Heart valve function or if you have stenosis. Stenosis is when the heart valves are too narrow. °If blood is flowing backward through the heart valves (regurgitation). °A tumor or infectious growth around the heart valves. °Areas of heart muscle that are not working well because of poor blood flow or injury from a heart attack. °Aneurysm detection. An aneurysm is a weak or damaged part of an artery wall. The wall bulges out from the normal force of blood pumping through the body. °Tell a health care provider about: °Any allergies you have. °All medicines you are taking, including vitamins, herbs, eye drops, creams, and over-the-counter medicines. °Any blood disorders you have. °Any surgeries you have had. °Any medical conditions you have. °Whether you are pregnant or may be pregnant. °What are the risks? °Generally, this is a safe test. However, problems may occur, including an allergic reaction to dye (contrast) that may be used during the test. °What happens before the test? °No specific preparation is needed. You may eat and drink normally. °What happens during the test? °You will take off your clothes from the waist up and put on a hospital gown. °Electrodes or electrocardiogram (ECG)patches may be placed on   your chest. The electrodes or patches are then connected to a device that monitors your heart rate and rhythm. °You will lie down on a table for an ultrasound exam. A gel will be applied to your chest to help sound waves pass through your skin. °A handheld device, called a transducer, will  be pressed against your chest and moved over your heart. The transducer produces sound waves that travel to your heart and bounce back (or "echo" back) to the transducer. These sound waves will be captured in real-time and changed into images of your heart that can be viewed on a video monitor. The images will be recorded on a computer and reviewed by your health care provider. °You may be asked to change positions or hold your breath for a short time. This makes it easier to get different views or better views of your heart. °In some cases, you may receive contrast through an IV in one of your veins. This can improve the quality of the pictures from your heart. °The procedure may vary among health care providers and hospitals.   °What can I expect after the test? °You may return to your normal, everyday life, including diet, activities, and medicines, unless your health care provider tells you not to do that. °Follow these instructions at home: °It is up to you to get the results of your test. Ask your health care provider, or the department that is doing the test, when your results will be ready. °Keep all follow-up visits. This is important. °Summary °An echocardiogram is a test that uses sound waves (ultrasound) to produce images of the heart. °Images from an echocardiogram can provide important information about the size and shape of your heart, heart muscle function, heart valve function, and other possible heart problems. °You do not need to do anything to prepare before this test. You may eat and drink normally. °After the echocardiogram is completed, you may return to your normal, everyday life, unless your health care provider tells you not to do that. °This information is not intended to replace advice given to you by your health care provider. Make sure you discuss any questions you have with your health care provider. °Document Revised: 03/03/2020 Document Reviewed: 03/03/2020 °Elsevier Patient  Education © 2021 [employer] ° ° °

## 2022-03-10 NOTE — Progress Notes (Signed)
Cardiology Office Note:    Date:  03/10/2022   ID:  Felicia Odom, DOB 1956-05-29, MRN [id number]  PCP:  Nicholos Johns, MD  Cardiologist:  Jenean Lindau, MD   Referring MD: Nicholos Johns, MD    ASSESSMENT:    1. Aortic atherosclerosis ([place])   2. Essential hypertension   3. PAF (paroxysmal atrial fibrillation) ([place])   4. Hyperlipidemia with target LDL less than 100   5. Mild aortic stenosis    PLAN:    In order of problems listed above:  Aortic atherosclerosis: Secondary prevention stressed with the patient.  Importance of compliance with diet medication stressed and she vocalized understanding. Paroxysmal atrial fibrillation:I discussed with the patient atrial fibrillation, disease process. Management and therapy including rate and rhythm control, anticoagulation benefits and potential risks were discussed extensively with the patient. Patient had multiple questions which were answered to patient's satisfaction.  She is very keen on getting an electrophysiology evaluation.  She has mild aortic stenosis.  Left atrial size by echo was unremarkable in 2022.  We will get a repeat echocardiogram. Mixed dyslipidemia: On statin therapy.  Diet emphasized.  Weight reduction stressed.  She promises to do better with this.  Importance of exercise stressed. Mild arctic stenosis: Echocardiogram will be helpful in assessing progress. Patient will be seen in follow-up appointment in 6 months or earlier if the patient has any concerns.    Medication Adjustments/Labs and Tests Ordered: Current medicines are reviewed at length with the patient today.  Concerns regarding medicines are outlined above.  Orders Placed This Encounter  Procedures   Ambulatory referral to Cardiac Electrophysiology   EKG 12-Lead   ECHOCARDIOGRAM COMPLETE   No orders of the defined types were placed in this encounter.    No chief complaint on file.    History of Present Illness:    Felicia Odom is  a 66 y.o. female.  Patient has past medical history of paroxysmal atrial fibrillation, aortic atherosclerosis and mixed dyslipidemia.  She denies any problems at this time and takes care of activities of daily living.  No chest pain orthopnea or PND.  Patient mentions to me that she has paroxysms of atrial fibrillation.  These are better with flecainide now however she wants to be evaluated by electrophysiology colleagues for possible atrial fibrillation.  No chest pain orthopnea or PND.  At the time of my evaluation, the patient is alert awake oriented and in no distress.  Past Medical History:  Diagnosis Date   Allergic rhinitis 10/01/2010   Qualifier: Diagnosis of  By: Annamaria Boots MD, [place] D    Anxiety    Aortic atherosclerosis ([place]) 10/06/2021   Arthritis    neck   Atherosclerosis of aorta ([place]) 05/08/2013   CXR 10/115/14   Atrial fibrillation with RVR ([place]) 09/24/2021   Atypical chest pain 05/08/2013   Angina vs esophagitis   Cannabis dependence with current use ([place]) 07/26/2016   Chest pain of uncertain etiology 3/57/0177   Chronic back pain    Chronic bronchitis ([facility])    Chronic diastolic CHF (congestive heart failure) ([place])    Chronic neck pain    Cocaine abuse ([place]) 04/05/2021   COPD mixed type ([place]) 02/18/2019   Office Spirometry 10/21/2015-slight restriction of exhaled volume, mild obstruction. FVC 2.69/78%, FEV1 2.04/76%, FEV1/FVC 0.76, FEF 25-75 percent 1.68/65% PFT 04/27/2016-minimal obstructive airways disease, minimal diffusion defect, insignificant response to bronchodilator. FVC 2.79/78%, FEV1 2.11/76%, ratio 0.76, FEF 25-75 percent 1.81/72%, TLC 92%, DLCO 76%.   DDD (  degenerative disc disease), cervical 06/15/2015   Diverticulosis    Eczema 11/29/2017   Encounter for long-term opiate analgesic use 07/06/2017   Essential hypertension 09/25/2015   Former tobacco use 10/01/2018   Former smoker Quit 2017 36-pack-year smoking history  07/12/2018-lung cancer screening CT-lung RADS 1   GERD  (gastroesophageal reflux disease)    takes Nexium daily   Helicobacter pylori gastritis 08/2019   Tx and eradicated   History of blood transfusion    in the 70's. No abnormal reaction noted   Hyperlipidemia    not on any meds bc refused to pick up from pharmacy   Hyperlipidemia with target LDL less than 100 01/21/2015   The 10-year ASCVD risk score Mikey Bussing DC Jr., et al., 2013) is: 10.4%   Values used to calculate the score:     Age: 62 years     Sex: Female     Is Non-Hispanic African American: No     Diabetic: Yes     Tobacco smoker: No     Systolic Blood Pressure: 852 mmHg     Is BP treated: No     HDL Cholesterol: 45.1 mg/dL     Total Cholesterol: 200 mg/dL   Joint pain    Joint swelling    Long-term current use of opiate analgesic 04/05/2021   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/08/2021   Mediastinal tumor    Moderate episode of recurrent major depressive disorder ([place]) 01/28/2020   Muscle spasm 08/02/2021   Nocturia    Obesity (BMI 30.0-34.9) 12/16/2021   Obsessive-compulsive disorder 06/08/2007   Qualifier: Diagnosis of  By: June Leap     PAF (paroxysmal atrial fibrillation) ([place]) 12/16/2021   PANIC DISORDER 06/08/2007   Qualifier: Diagnosis of  By: June Leap     Pneumonia    2016   PONV (postoperative nausea and vomiting)    Primary osteoarthritis involving multiple joints 01/21/2015   Right calf pain    Routine general medical examination at a health care facility 01/28/2020   Seborrheic dermatitis of scalp 02/11/2015   Seizures ([place])    in the 80's   Thyroid nodule 07/30/2020   IMPRESSION: No significant interval change of bilateral thyroid nodules.   The above is in keeping with the ACR TI-RADS recommendations - J Am Coll Radiol 2017;14:587-595.     Electronically Signed   By: Miachel Roux M.D.   On: 08/17/2020 14:18   Type II diabetes mellitus with manifestations ([place]) 02/18/2019   The 10-year ASCVD risk score Mikey Bussing DC Jr., et al., 2013) is: 9.1%   Values used to calculate the score:     Age: 32  years     Sex: Female     Is Non-Hispanic African American: No     Diabetic: Yes     Tobacco smoker: No     Systolic Blood Pressure: 778 mmHg     Is BP treated: No     HDL Cholesterol: 46.2 mg/dL     Total Cholesterol: 180 mg/dL   URI (upper respiratory infection) 07/08/2021    Past Surgical History:  Procedure Laterality Date   COLONOSCOPY     ECTOPIC PREGNANCY SURGERY     x 2    ESOPHAGOGASTRODUODENOSCOPY     LAPAROSCOPIC LYSIS INTESTINAL ADHESIONS     x 2   RESECTION OF MEDIASTINAL MASS N/A 06/10/2016   Procedure: RESECTION OF MEDIASTINAL MASS;  Surgeon: Grace Isaac, MD;  Location: [facility];  Service: Thoracic;  Laterality: N/A;   VIDEO ASSISTED  THORACOSCOPY Right 06/10/2016   Procedure: VIDEO ASSISTED THORACOSCOPY WITH PLACEMENT OF ON Q TUNNELER;  Surgeon: Grace Isaac, MD;  Location: [facility];  Service: Thoracic;  Laterality: Right;   VIDEO BRONCHOSCOPY N/A 06/10/2016   Procedure: VIDEO BRONCHOSCOPY;  Surgeon: Grace Isaac, MD;  Location: [facility];  Service: Thoracic;  Laterality: N/A;    Current Medications: Current Meds  Medication Sig   acetaminophen (TYLENOL) 500 MG tablet Take 1,000 mg by mouth every 6 (six) hours as needed for fever or headache.   albuterol (PROVENTIL) (2.5 MG/3ML) 0.083% nebulizer solution USE 1 VIAL IN NEBULIZER EVERY 6 HOURS - And As Needed   albuterol (VENTOLIN HFA) 108 (90 Base) MCG/ACT inhaler INHALE 1 TO 2 PUFFS INTO THE LUNGS EVERY 4 HOURS AS NEEDED FOR WHEEZING OR SHORTNESS OF BREATH   ALPRAZolam (XANAX) 1 MG tablet TAKE 1 TABLET BY MOUTH EVERY 6 HOURS AS NEEDED FOR ANXIETY.   apixaban (ELIQUIS) 5 MG TABS tablet Take 1 tablet (5 mg total) by mouth 2 (two) times daily.   diltiazem (CARDIZEM CD) 120 MG 24 hr capsule Take 1 capsule (120 mg total) by mouth daily.   diltiazem (CARDIZEM) 60 MG tablet Take 1 tablet (60 mg total) by mouth every 4 (four) hours as needed (elevated heart rate).   esomeprazole (NEXIUM) 40 MG capsule Take 40 mg by mouth  daily as needed (Acid reflux).   flecainide (TAMBOCOR) 50 MG tablet Take 1 tablet (50 mg total) by mouth every 12 (twelve) hours.   furosemide (LASIX) 40 MG tablet Take 40 mg by mouth 2 (two) times daily.   Multiple Vitamin (MULTIVITAMIN) tablet Take 1 tablet by mouth daily.   potassium chloride (MICRO-K) 10 MEQ CR capsule Take 10 mEq by mouth 2 (two) times daily.   rosuvastatin (CRESTOR) 5 MG tablet Take 1 tablet (5 mg total) by mouth daily.   Tiotropium Bromide Monohydrate (SPIRIVA RESPIMAT) 1.25 MCG/ACT AERS Inhale 2 puffs into the lungs daily.   valACYclovir (VALTREX) 1000 MG tablet Take 4,000 mg by mouth once.   Vitamin D, Ergocalciferol, (DRISDOL) 1.25 MG (50000 UNIT) CAPS capsule Take 50,000 Units by mouth once a week. Fridays     Allergies:   No known allergies   Social History   Socioeconomic History   Marital status: Divorced    Spouse name: Not on file   Number of children: 1   Years of education: Not on file   Highest education level: Not on file  Occupational History   Occupation: retired  Tobacco Use   Smoking status: Former    Packs/day: 1.00    Years: 36.00    Total pack years: 36.00    Types: Cigarettes    Quit date: 2017    Years since quitting: 6.6   Smokeless tobacco: Never   Tobacco comments:    last cigarette 2017  Vaping Use   Vaping Use: Never used  Substance and Sexual Activity   Alcohol use: No    Alcohol/week: 0.0 standard drinks of alcohol   Drug use: No   Sexual activity: Not on file  Other Topics Concern   Not on file  Social History Narrative   Retired [employer]   Divorced   1 son   Former smoker   2 coffee/daty   No EtOH, no drugs   Social Determinants of Health   Financial Resource Strain: Not on file  Food Insecurity: Not on file  Transportation Needs: Not on file  Physical Activity: Not  on file  Stress: Not on file  Social Connections: Not on file     Family History: The patient's family history includes Breast  cancer in her sister; Clotting disorder in her mother; Colon cancer in her sister; Diabetes in her mother; Heart attack in her father; Heart failure in her father and mother; Kidney disease in her mother; Prostate cancer in her father. There is no history of Esophageal cancer, Rectal cancer, or Stomach cancer.  ROS:   Please see the history of present illness.    All other systems reviewed and are negative.  EKGs/Labs/Other Studies Reviewed:    The following studies were reviewed today: EKG reveals sinus rhythm septal infarction nonspecific ST changes   Recent Labs: 06/14/2021: ALT 17 08/03/2021: TSH 1.890 09/26/2021: Hemoglobin 13.9; Platelets 271 02/23/2022: B Natriuretic Peptide 31.9; BUN 16; Creatinine, Ser 0.77; Magnesium 2.1; Potassium 3.4; Sodium 139  Recent Lipid Panel    Component Value Date/Time   CHOL 206 (H) 06/14/2021 1122   TRIG 206 (H) 06/14/2021 1122   HDL 46 06/14/2021 1122   CHOLHDL 4.5 (H) 06/14/2021 1122   CHOLHDL 4 10/28/2020 0827   VLDL 30.2 10/28/2020 0827   LDLCALC 124 (H) 06/14/2021 1122   LDLDIRECT 142.0 01/21/2015 1537    Physical Exam:    VS:  BP 124/68   Pulse 66   Ht 5' 6.6" (1.692 m)   Wt 200 lb 3.2 oz (90.8 kg)   SpO2 96%   BMI 31.73 kg/m     Wt Readings from Last 3 Encounters:  03/10/22 200 lb 3.2 oz (90.8 kg)  02/14/22 207 lb 9.6 oz (94.2 kg)  01/31/22 208 lb (94.3 kg)     GEN: Patient is in no acute distress HEENT: Normal NECK: No JVD; No carotid bruits LYMPHATICS: No lymphadenopathy CARDIAC: Hear sounds regular, 2/6 systolic murmur at the apex. RESPIRATORY:  Clear to auscultation without rales, wheezing or rhonchi  ABDOMEN: Soft, non-tender, non-distended MUSCULOSKELETAL:  No edema; No deformity  SKIN: Warm and dry NEUROLOGIC:  Alert and oriented x 3 PSYCHIATRIC:  Normal affect   Signed, Jenean Lindau, MD  03/10/2022 9:51 AM    [place]

## 2022-03-15 DIAGNOSIS — E1169 Type 2 diabetes mellitus with other specified complication: Secondary | ICD-10-CM | POA: Diagnosis not present

## 2022-03-15 DIAGNOSIS — Z79899 Other long term (current) drug therapy: Secondary | ICD-10-CM | POA: Diagnosis not present

## 2022-03-15 DIAGNOSIS — Z8679 Personal history of other diseases of the circulatory system: Secondary | ICD-10-CM | POA: Diagnosis not present

## 2022-03-15 DIAGNOSIS — K219 Gastro-esophageal reflux disease without esophagitis: Secondary | ICD-10-CM | POA: Diagnosis not present

## 2022-03-15 DIAGNOSIS — E559 Vitamin D deficiency, unspecified: Secondary | ICD-10-CM | POA: Diagnosis not present

## 2022-03-15 DIAGNOSIS — G894 Chronic pain syndrome: Secondary | ICD-10-CM | POA: Diagnosis not present

## 2022-03-15 DIAGNOSIS — I38 Endocarditis, valve unspecified: Secondary | ICD-10-CM | POA: Diagnosis not present

## 2022-03-15 DIAGNOSIS — J449 Chronic obstructive pulmonary disease, unspecified: Secondary | ICD-10-CM | POA: Diagnosis not present

## 2022-03-15 DIAGNOSIS — E785 Hyperlipidemia, unspecified: Secondary | ICD-10-CM | POA: Diagnosis not present

## 2022-03-18 ENCOUNTER — Telehealth: Payer: Self-pay | Admitting: Cardiology

## 2022-03-18 MED ORDER — DILTIAZEM HCL 60 MG PO TABS: 60.0000 mg | ORAL_TABLET | ORAL | 6 refills | Status: DC | PRN

## 2022-03-18 NOTE — Telephone Encounter (Signed)
*  STAT* If patient is at the pharmacy, call can be transferred to refill team.   1. Which medications need to be refilled? (please list name of each medication and dose if known) diltiazem (CARDIZEM) 60 MG tablet  2. Which pharmacy/location (including street and city if local pharmacy) is medication to be sent to? [id number]- [place], [place] - [address]  3. Do they need a 30 day or 90 day supply? [address]

## 2022-03-22 ENCOUNTER — Ambulatory Visit: Payer: Medicare Other | Attending: Cardiology

## 2022-03-22 DIAGNOSIS — I480 Paroxysmal atrial fibrillation: Secondary | ICD-10-CM | POA: Diagnosis not present

## 2022-03-22 DIAGNOSIS — I700 Atherosclerosis of aorta: Secondary | ICD-10-CM | POA: Diagnosis not present

## 2022-03-22 LAB — ECHOCARDIOGRAM COMPLETE
AR max vel: 1.26 cm2
AV Area VTI: 1.17 cm2
AV Area mean vel: 1.35 cm2
AV Mean grad: 17.8 mmHg
AV Peak grad: 31.1 mmHg
Ao pk vel: 2.79 m/s
Area-P 1/2: 2 cm2
P 1/2 time: 366 msec
S' Lateral: 3.2 cm

## 2022-03-23 ENCOUNTER — Other Ambulatory Visit: Payer: Self-pay | Admitting: Internal Medicine

## 2022-03-23 DIAGNOSIS — J4520 Mild intermittent asthma, uncomplicated: Secondary | ICD-10-CM

## 2022-03-25 ENCOUNTER — Other Ambulatory Visit: Payer: Self-pay

## 2022-03-25 ENCOUNTER — Encounter (HOSPITAL_COMMUNITY): Payer: Self-pay

## 2022-03-25 ENCOUNTER — Telehealth: Payer: Self-pay | Admitting: Cardiology

## 2022-03-25 ENCOUNTER — Emergency Department (HOSPITAL_COMMUNITY)
Admission: EM | Admit: 2022-03-25 | Discharge: 2022-03-25 | Disposition: A | Discharge status: Home / Self Care | Payer: Medicare Other | Attending: Emergency Medicine | Admitting: Emergency Medicine

## 2022-03-25 ENCOUNTER — Emergency Department (HOSPITAL_COMMUNITY): Payer: Medicare Other

## 2022-03-25 ENCOUNTER — Telehealth: Payer: Self-pay

## 2022-03-25 DIAGNOSIS — M542 Cervicalgia: Secondary | ICD-10-CM | POA: Diagnosis not present

## 2022-03-25 DIAGNOSIS — I480 Paroxysmal atrial fibrillation: Secondary | ICD-10-CM | POA: Diagnosis not present

## 2022-03-25 DIAGNOSIS — I5032 Chronic diastolic (congestive) heart failure: Secondary | ICD-10-CM | POA: Diagnosis not present

## 2022-03-25 DIAGNOSIS — Z79899 Other long term (current) drug therapy: Secondary | ICD-10-CM | POA: Diagnosis not present

## 2022-03-25 DIAGNOSIS — R519 Headache, unspecified: Secondary | ICD-10-CM | POA: Diagnosis not present

## 2022-03-25 DIAGNOSIS — I4891 Unspecified atrial fibrillation: Secondary | ICD-10-CM | POA: Diagnosis not present

## 2022-03-25 DIAGNOSIS — R6889 Other general symptoms and signs: Secondary | ICD-10-CM | POA: Diagnosis not present

## 2022-03-25 DIAGNOSIS — I110 Hypertensive heart disease with heart failure: Secondary | ICD-10-CM | POA: Diagnosis not present

## 2022-03-25 DIAGNOSIS — I10 Essential (primary) hypertension: Secondary | ICD-10-CM

## 2022-03-25 DIAGNOSIS — R002 Palpitations: Secondary | ICD-10-CM | POA: Diagnosis present

## 2022-03-25 DIAGNOSIS — Z743 Need for continuous supervision: Secondary | ICD-10-CM | POA: Diagnosis not present

## 2022-03-25 DIAGNOSIS — I499 Cardiac arrhythmia, unspecified: Secondary | ICD-10-CM | POA: Diagnosis not present

## 2022-03-25 DIAGNOSIS — Z7901 Long term (current) use of anticoagulants: Secondary | ICD-10-CM | POA: Diagnosis not present

## 2022-03-25 DIAGNOSIS — R079 Chest pain, unspecified: Secondary | ICD-10-CM | POA: Diagnosis not present

## 2022-03-25 LAB — URINALYSIS, ROUTINE W REFLEX MICROSCOPIC
Bacteria, UA: NONE SEEN
Bilirubin Urine: NEGATIVE
Glucose, UA: NEGATIVE mg/dL
Hgb urine dipstick: NEGATIVE
Ketones, ur: NEGATIVE mg/dL
Nitrite: NEGATIVE
Protein, ur: NEGATIVE mg/dL
Specific Gravity, Urine: 1.002 — ABNORMAL LOW (ref 1.005–1.030)
pH: 6 (ref 5.0–8.0)

## 2022-03-25 LAB — CBC WITH DIFFERENTIAL/PLATELET
Abs Immature Granulocytes: 0.04 10*3/uL (ref 0.00–0.07)
Basophils Absolute: 0.1 10*3/uL (ref 0.0–0.1)
Basophils Relative: 1 %
Eosinophils Absolute: 0.2 10*3/uL (ref 0.0–0.5)
Eosinophils Relative: 2 %
HCT: 42 % (ref 36.0–46.0)
Hemoglobin: 14.3 g/dL (ref 12.0–15.0)
Immature Granulocytes: 0 %
Lymphocytes Relative: 22 %
Lymphs Abs: 2 10*3/uL (ref 0.7–4.0)
MCH: 29.1 pg (ref 26.0–34.0)
MCHC: 34 g/dL (ref 30.0–36.0)
MCV: 85.4 fL (ref 80.0–100.0)
Monocytes Absolute: 0.7 10*3/uL (ref 0.1–1.0)
Monocytes Relative: 8 %
Neutro Abs: 6.3 10*3/uL (ref 1.7–7.7)
Neutrophils Relative %: 67 %
Platelets: 262 10*3/uL (ref 150–400)
RBC: 4.92 MIL/uL (ref 3.87–5.11)
RDW: 13 % (ref 11.5–15.5)
WBC: 9.3 10*3/uL (ref 4.0–10.5)
nRBC: 0 % (ref 0.0–0.2)

## 2022-03-25 LAB — RAPID URINE DRUG SCREEN, HOSP PERFORMED
Amphetamines: NOT DETECTED
Barbiturates: NOT DETECTED
Benzodiazepines: POSITIVE — AB
Cocaine: NOT DETECTED
Opiates: NOT DETECTED
Tetrahydrocannabinol: NOT DETECTED

## 2022-03-25 LAB — BASIC METABOLIC PANEL
Anion gap: 10 (ref 5–15)
BUN: 15 mg/dL (ref 8–23)
CO2: 26 mmol/L (ref 22–32)
Calcium: 8.9 mg/dL (ref 8.9–10.3)
Chloride: 104 mmol/L (ref 98–111)
Creatinine, Ser: 0.87 mg/dL (ref 0.44–1.00)
GFR, Estimated: >60 mL/min (ref >60)
Glucose, Bld: 116 mg/dL — ABNORMAL HIGH (ref 70–99)
Potassium: 3.5 mmol/L (ref 3.5–5.1)
Sodium: 140 mmol/L (ref 135–145)

## 2022-03-25 LAB — MAGNESIUM: Magnesium: 2 mg/dL (ref 1.7–2.4)

## 2022-03-25 LAB — TROPONIN I (HIGH SENSITIVITY)
Troponin I (High Sensitivity): 10 ng/L (ref <18)
Troponin I (High Sensitivity): 10 ng/L (ref <18)

## 2022-03-25 LAB — PHOSPHORUS: Phosphorus: 3.8 mg/dL (ref 2.5–4.6)

## 2022-03-25 LAB — BRAIN NATRIURETIC PEPTIDE: B Natriuretic Peptide: 28.8 pg/mL (ref 0.0–100.0)

## 2022-03-25 MED ORDER — MAGNESIUM SULFATE IN D5W 1-5 GM/100ML-% IV SOLN
1.0000 g | Freq: Once | INTRAVENOUS | Status: DC
  Filled 2022-03-25: qty 100

## 2022-03-25 MED ORDER — POTASSIUM CHLORIDE CRYS ER 20 MEQ PO TBCR: 20.0000 meq | EXTENDED_RELEASE_TABLET | Freq: Two times a day (BID) | ORAL | 0 refills | Status: DC

## 2022-03-25 MED ORDER — POTASSIUM CHLORIDE 10 MEQ/100ML IV SOLN
10.0000 meq | INTRAVENOUS | Status: DC
  Administered 2022-03-25: 10 meq via INTRAVENOUS
  Filled 2022-03-25 (×2): qty 100

## 2022-03-25 MED ORDER — FLECAINIDE ACETATE 100 MG PO TABS: 100.0000 mg | ORAL_TABLET | Freq: Two times a day (BID) | ORAL | 11 refills | Status: DC

## 2022-03-25 MED ORDER — MAGNESIUM SULFATE 2 GM/50ML IV SOLN
2.0000 g | Freq: Once | INTRAVENOUS | Status: AC
  Administered 2022-03-25: 2 g via INTRAVENOUS
  Filled 2022-03-25: qty 50

## 2022-03-25 MED ORDER — FLECAINIDE ACETATE 100 MG PO TABS
100.0000 mg | ORAL_TABLET | Freq: Once | ORAL | Status: AC
Start: 2022-03-25 — End: 2022-03-25
  Administered 2022-03-25: 100 mg via ORAL
  Filled 2022-03-25: qty 1

## 2022-03-25 MED ORDER — POTASSIUM CHLORIDE CRYS ER 20 MEQ PO TBCR
40.0000 meq | EXTENDED_RELEASE_TABLET | Freq: Once | ORAL | Status: AC
  Administered 2022-03-25: 40 meq via ORAL
  Filled 2022-03-25: qty 2

## 2022-03-25 NOTE — Discharge Instructions (Signed)
1.  Your flecainide dose has been increased by cardiology to 100 mg twice a day.  You are to continue your Cardizem as previously prescribed. 2.  Your potassium has been increased to 20 mEq twice a day.  Your previous prescription is for 10 mEq twice a day.  If you still have leftover potassium of 10 mEq, you may take 2 twice a day and then fill the prescription for 20 mEq twice a day.  Discussed this with your doctor so they can monitor your potassium levels closely.  Ideally with atrial fibrillation, your potassium level should be to the mid to high normal range.  However, high potassium is more dangerous than low potassium so this needs to be regularly monitored by your physicians. 3.  Follow-up with a cardiologist as recommended.  Return to the emergency department immediately if you have new worsening or concerning symptoms.

## 2022-03-25 NOTE — Telephone Encounter (Signed)
Patient c/o Palpitations:  High priority if patient c/o lightheadedness, shortness of breath, or chest pain  How long have you had palpitations/irregular HR/ Afib? Are you having the symptoms now? A few minutes, yes  Are you currently experiencing lightheadedness, SOB or CP? no  Do you have a history of afib (atrial fibrillation) or irregular heart rhythm? yes  Have you checked your BP or HR? (document readings if available): HR 154-124, BP is okay   Are you experiencing any other symptoms? Just sweating  Patient states she is in afib and says she is panicking. She says took her regular morning Cardizem 120 mg at 6 am. She says she was given 60 mg to take when her HR is elevated. She states she is not sure if she needs to take the 60 mg tablet or call the ambulance. She says she is sweating a lot and took her clothes off and turned the air conditioner on. She says her afib usually happens on a Friday.

## 2022-03-25 NOTE — ED Notes (Signed)
Patient verbalizes understanding of discharge instructions. Opportunity for questioning and answers were provided. Armband removed by staff, pt discharged from ED. Pt taken to ED waiting room via wheel chair.  

## 2022-03-25 NOTE — Consult Note (Addendum)
Cardiology Consultation   Patient ID: KALEB LINQUIST MRN: [id number]; DOB: [birth year]  Admit date: 03/25/2022 Date of Consult: 03/25/2022  PCP:  Nicholos Johns, [facility] Cardiologist:  Jenean Lindau, MD        Patient Profile:   NARYAH CLENNEY is a 66 y.o. female with a hx of A-fib on eliquis, HTN, HLD, anxiety who is being seen 03/25/2022 for the evaluation of A-fib with RVR at the request of Dr. Charlesetta Shanks.  History of Present Illness:   Ms. Tarleton s a 66 y.o. female with a hx of paroxysmal A-fib on eliquis, HTN, HLD, anxiety presents with palpitations since mid-morning today.  She was running errands this morning when she felt palpitations and pauses.  She became diaphoretic and anxious.  She reached out to Dr. Julien Nordmann office and was informed to take an extra 60 mg of the cardizem. She reports it dropped her HR drastically but continued to have palpitations which prompted her to seek evaluation. She takes Cardizem 120 mg daily, flecainide 50 mg BID and Eliquis 5 mg BID which she took this morning. Echo on 03/22/22 showed EF 60-65% with no wall motion abnormalities, grade 1 diastolic dysfunction, mildly dilated left atrium, mild AR and AS. Has a band like headache since the palpitations started with some left side shoulder pain. Denies chest pain, shortness of breath, dizziness, nausea or vomiting.   History of multiple ED visits for similar problem, last one on 8/2 where she converted back to sinus after IV diltiazem bolus by EMS. She was advised to take extra diltizaem 60 mg q4h PRN. She saw Dr. Geraldo Pitter on 8/17, EKG showed NSR, referral made to electrophysiology evaluation.   Initial labs have been unremarkable. UDS positive for benzodiazepines which she takes Xanax. CXR showed blunting of left costophrenic angle seen in prior CXR.   Past Medical History:  Diagnosis Date   Allergic rhinitis 10/01/2010   Qualifier: Diagnosis of  By: Annamaria Boots  MD, Clinton D    Anxiety    Aortic atherosclerosis ([place]) 10/06/2021   Arthritis    neck   Atherosclerosis of aorta ([place]) 05/08/2013   CXR 10/115/14   Atrial fibrillation with RVR ([place]) 09/24/2021   Atypical chest pain 05/08/2013   Angina vs esophagitis   Cannabis dependence with current use ([place]) 07/26/2016   Chest pain of uncertain etiology 6/78/9381   Chronic back pain    Chronic bronchitis (H[facility])    Chronic diastolic CHF (congestive heart failure) ([place])    Chronic neck pain    Cocaine abuse ([place]) 04/05/2021   COPD mixed type ([place]) 02/18/2019   Office Spirometry 10/21/2015-slight restriction of exhaled volume, mild obstruction. FVC 2.69/78%, FEV1 2.04/76%, FEV1/FVC 0.76, FEF 25-75 percent 1.68/65% PFT 04/27/2016-minimal obstructive airways disease, minimal diffusion defect, insignificant response to bronchodilator. FVC 2.79/78%, FEV1 2.11/76%, ratio 0.76, FEF 25-75 percent 1.81/72%, TLC 92%, DLCO 76%.   DDD (degenerative disc disease), cervical 06/15/2015   Diverticulosis    Eczema 11/29/2017   Encounter for long-term opiate analgesic use 07/06/2017   Essential hypertension 09/25/2015   Former tobacco use 10/01/2018   Former smoker Quit 2017 36-pack-year smoking history  07/12/2018-lung cancer screening CT-lung RADS 1   GERD (gastroesophageal reflux disease)    takes Nexium daily   Helicobacter pylori gastritis 08/2019   Tx and eradicated   History of blood transfusion    in the 70's. No abnormal reaction noted   Hyperlipidemia    not on any meds  bc refused to pick up from pharmacy   Hyperlipidemia with target LDL less than 100 01/21/2015   The 10-year ASCVD risk score Mikey Bussing DC Jr., et al., 2013) is: 10.4%   Values used to calculate the score:     Age: 18 years     Sex: Female     Is Non-Hispanic African American: No     Diabetic: Yes     Tobacco smoker: No     Systolic Blood Pressure: 694 mmHg     Is BP treated: No     HDL Cholesterol: 45.1 mg/dL     Total Cholesterol: 200 mg/dL   Joint pain     Joint swelling    Long-term current use of opiate analgesic 04/05/2021   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/08/2021   Mediastinal tumor    Moderate episode of recurrent major depressive disorder ([place]) 01/28/2020   Muscle spasm 08/02/2021   Nocturia    Obesity (BMI 30.0-34.9) 12/16/2021   Obsessive-compulsive disorder 06/08/2007   Qualifier: Diagnosis of  By: June Leap     PAF (paroxysmal atrial fibrillation) ([place]) 12/16/2021   PANIC DISORDER 06/08/2007   Qualifier: Diagnosis of  By: June Leap     Pneumonia    2016   PONV (postoperative nausea and vomiting)    Primary osteoarthritis involving multiple joints 01/21/2015   Right calf pain    Routine general medical examination at a health care facility 01/28/2020   Seborrheic dermatitis of scalp 02/11/2015   Seizures ([place])    in the 80's   Thyroid nodule 07/30/2020   IMPRESSION: No significant interval change of bilateral thyroid nodules.   The above is in keeping with the ACR TI-RADS recommendations - J Am Coll Radiol 2017;14:587-595.     Electronically Signed   By: Miachel Roux M.D.   On: 08/17/2020 14:18   Type II diabetes mellitus with manifestations ([place]) 02/18/2019   The 10-year ASCVD risk score Mikey Bussing DC Jr., et al., 2013) is: 9.1%   Values used to calculate the score:     Age: 74 years     Sex: Female     Is Non-Hispanic African American: No     Diabetic: Yes     Tobacco smoker: No     Systolic Blood Pressure: 854 mmHg     Is BP treated: No     HDL Cholesterol: 46.2 mg/dL     Total Cholesterol: 180 mg/dL   URI (upper respiratory infection) 07/08/2021    Past Surgical History:  Procedure Laterality Date   COLONOSCOPY     ECTOPIC PREGNANCY SURGERY     x 2    ESOPHAGOGASTRODUODENOSCOPY     LAPAROSCOPIC LYSIS INTESTINAL ADHESIONS     x 2   RESECTION OF MEDIASTINAL MASS N/A 06/10/2016   Procedure: RESECTION OF MEDIASTINAL MASS;  Surgeon: Grace Isaac, MD;  Location: [facility];  Service: Thoracic;  Laterality: N/A;   VIDEO ASSISTED THORACOSCOPY Right  06/10/2016   Procedure: VIDEO ASSISTED THORACOSCOPY WITH PLACEMENT OF ON Q TUNNELER;  Surgeon: Grace Isaac, MD;  Location: [facility];  Service: Thoracic;  Laterality: Right;   VIDEO BRONCHOSCOPY N/A 06/10/2016   Procedure: VIDEO BRONCHOSCOPY;  Surgeon: Grace Isaac, MD;  Location: [facility];  Service: Thoracic;  Laterality: N/A;     Home Medications:  Prior to Admission medications   Medication Sig Start Date End Date Taking? Authorizing Provider  acetaminophen (TYLENOL) 500 MG tablet Take 1,000 mg by mouth every 6 (six) hours as needed for  fever or headache.    [provider]  albuterol (PROVENTIL) (2.5 MG/3ML) 0.083% nebulizer solution USE 1 VIAL IN NEBULIZER EVERY 6 HOURS - And As Needed 03/23/22   Baird Lyons D, MD  albuterol (VENTOLIN HFA) 108 (90 Base) MCG/ACT inhaler INHALE 1 TO 2 PUFFS INTO THE LUNGS EVERY 4 HOURS AS NEEDED FOR WHEEZING OR SHORTNESS OF BREATH 12/02/21   Young, Kasandra Knudsen, MD  ALPRAZolam (XANAX) 1 MG tablet TAKE 1 TABLET BY MOUTH EVERY 6 HOURS AS NEEDED FOR ANXIETY. 01/18/22   Deneise Lever, MD  apixaban (ELIQUIS) 5 MG TABS tablet Take 1 tablet (5 mg total) by mouth 2 (two) times daily. 02/14/22   Revankar, Reita Cliche, MD  diltiazem (CARDIZEM CD) 120 MG 24 hr capsule Take 1 capsule (120 mg total) by mouth daily. 02/14/22   Revankar, Reita Cliche, MD  diltiazem (CARDIZEM) 60 MG tablet Take 1 tablet (60 mg total) by mouth every 4 (four) hours as needed (elevated heart rate). 03/18/22   Revankar, Reita Cliche, MD  esomeprazole (NEXIUM) 40 MG capsule Take 40 mg by mouth daily as needed (Acid reflux). 11/01/21   [provider]  flecainide (TAMBOCOR) 50 MG tablet Take 1 tablet (50 mg total) by mouth every 12 (twelve) hours. 02/14/22   Revankar, Reita Cliche, MD  furosemide (LASIX) 40 MG tablet Take 40 mg by mouth 2 (two) times daily. 12/02/21   [provider]  Multiple Vitamin (MULTIVITAMIN) tablet Take 1 tablet by mouth daily.    [provider]  potassium  chloride (MICRO-K) 10 MEQ CR capsule Take 10 mEq by mouth 2 (two) times daily. 12/02/21   [provider]  rosuvastatin (CRESTOR) 5 MG tablet Take 1 tablet (5 mg total) by mouth daily. 11/01/21   Revankar, Reita Cliche, MD  Tiotropium Bromide Monohydrate (SPIRIVA RESPIMAT) 1.25 MCG/ACT AERS Inhale 2 puffs into the lungs daily. 10/27/21   Deneise Lever, MD  valACYclovir (VALTREX) 1000 MG tablet Take 4,000 mg by mouth once. 02/21/22   [provider]  Vitamin D, Ergocalciferol, (DRISDOL) 1.25 MG (50000 UNIT) CAPS capsule Take 50,000 Units by mouth once a week. Fridays 06/21/21   [provider]  progesterone (PROMETRIUM) 100 MG capsule Take 100 mg by mouth daily.  10/06/11  [provider]    Inpatient Medications: Scheduled Meds:  flecainide  100 mg Oral Once   Continuous Infusions:  magnesium sulfate bolus IVPB 2 g (03/25/22 1551)   potassium chloride 10 mEq (03/25/22 1552)   PRN Meds:   Allergies:    Allergies  Allergen Reactions   No Known Allergies     Social History:   Social History   Socioeconomic History   Marital status: Divorced    Spouse name: Not on file   Number of children: 1   Years of education: Not on file   Highest education level: Not on file  Occupational History   Occupation: retired  Tobacco Use   Smoking status: Former    Packs/day: 1.00    Years: 36.00    Total pack years: 36.00    Types: Cigarettes    Quit date: 2017    Years since quitting: 6.6   Smokeless tobacco: Never   Tobacco comments:    last cigarette 2017  Vaping Use   Vaping Use: Never used  Substance and Sexual Activity   Alcohol use: No    Alcohol/week: 0.0 standard drinks of alcohol   Drug use: No   Sexual activity: Not on  file  Other Topics Concern   Not on file  Social History Narrative   Retired [employer]   Divorced   1 son   Former smoker   2 coffee/daty   No EtOH, no drugs   Social Determinants of Radio broadcast assistant  Strain: Not on file  Food Insecurity: Not on file  Transportation Needs: Not on file  Physical Activity: Not on file  Stress: Not on file  Social Connections: Not on file  Intimate Partner Violence: Not on file    Family History:    Family History  Problem Relation Age of Onset   Heart attack Father    Prostate cancer Father    Heart failure Father    Diabetes Mother    Clotting disorder Mother        PE   Heart failure Mother    Kidney disease Mother    Breast cancer Sister    Colon cancer Sister    Esophageal cancer Neg Hx    Rectal cancer Neg Hx    Stomach cancer Neg Hx      ROS:  Please see the history of present illness.  All other ROS reviewed and negative.     Physical Exam/Data:   Vitals:   03/25/22 1358 03/25/22 1400 03/25/22 1430 03/25/22 1600  BP:  (!) 119/94 108/71 (!) 112/54  Pulse:  (!) 131 (!) 113 (!) 37  Resp:  17 (!) 24 20  Temp:  97.9 F (36.6 C)    TempSrc:  Oral    SpO2: 99% 99% 98% 98%  Weight:      Height:       No intake or output data in the 24 hours ending 03/25/22 1628    03/25/2022    1:55 PM 03/10/2022    9:25 AM 02/14/2022    9:10 AM  Last 3 Weights  Weight (lbs) 200 lb 2.8 oz 200 lb 3.2 oz 207 lb 9.6 oz  Weight (kg) 90.8 kg 90.81 kg 94.167 kg     Body mass index is 31.73 kg/m.  General:  Well nourished, well developed, in mild distress HEENT: normal Neck: supple Vascular: Distal pulses 2+ bilaterally Cardiac:  irregular rhythm, no murmur Lungs:  normal work of breathing, clear to auscultation bilaterally Abd: soft, nontender, some distension  Ext: no edema Musculoskeletal:  normal bulk and tone Skin: warm and dry  Neuro:  alert and oriented Psych:  anxious mood, normal behavior  EKG:  The EKG was personally reviewed and demonstrates:  atrial fibrillation, rate 115, sinus pauses Telemetry:  Telemetry was personally reviewed and demonstrates:  atrial fibrillation with pauses, rate 80-90 and at times >100  Relevant CV  Studies: 03/22/22 Echo IMPRESSIONS   1. Left ventricular ejection fraction, by estimation, is 60 to 65%. The  left ventricle has normal function. The left ventricle has no regional  wall motion abnormalities. Left ventricular diastolic parameters are  consistent with Grade I diastolic  dysfunction (impaired relaxation).   2. Left atrial size was mildly dilated.   3. Aortic valve regurgitation is mild. Mild aortic valve stenosis.   Laboratory Data:  High Sensitivity Troponin:   Recent Labs  Lab 02/23/22 1806 03/25/22 1417  TROPONINIHS 7 10     Chemistry Recent Labs  Lab 03/25/22 1417  NA 140  K 3.5  CL 104  CO2 26  GLUCOSE 116*  BUN 15  CREATININE 0.87  CALCIUM 8.9  MG 2.0  GFRNONAA >60  ANIONGAP 10  No results for input(s): "PROT", "ALBUMIN", "AST", "ALT", "ALKPHOS", "BILITOT" in the last 168 hours. Lipids No results for input(s): "CHOL", "TRIG", "HDL", "LABVLDL", "LDLCALC", "CHOLHDL" in the last 168 hours.  Hematology Recent Labs  Lab 03/25/22 1417  WBC 9.3  RBC 4.92  HGB 14.3  HCT 42.0  MCV 85.4  MCH 29.1  MCHC 34.0  RDW 13.0  PLT 262   Thyroid No results for input(s): "TSH", "FREET4" in the last 168 hours.  BNP Recent Labs  Lab 03/25/22 1418  BNP 28.8    DDimer No results for input(s): "DDIMER" in the last 168 hours.   Radiology/Studies:  DG Chest Port 1 View  Result Date: 03/25/2022 CLINICAL DATA:  Headache and neck pain. EXAM: PORTABLE CHEST 1 VIEW COMPARISON:  02/23/2022 FINDINGS: 1425 hours. Similar appearance of chronic atelectasis at the left base versus scarring. Right lung clear. Cardiopericardial silhouette is at upper limits of normal for size. The visualized bony structures of the thorax are unremarkable. Telemetry leads overlie the chest. IMPRESSION: Chronic atelectasis or scarring at the left base. No acute cardiopulmonary findings. Electronically Signed   By: Misty Stanley M.D.   On: 03/25/2022 14:41   ECHOCARDIOGRAM  COMPLETE  Result Date: 03/22/2022    ECHOCARDIOGRAM REPORT   Patient Name:   CLOA BUSHONG Date of Exam: 03/22/2022 Medical Rec #:  [id number]            Height:       66.6 in Accession #:    [id number]           Weight:       200.2 lb Date of Birth:  [birth year]            BSA:          2.014 m Patient Age:    29 years             BP:           124/68 mmHg Patient Gender: F                    HR:           70 bpm. Exam Location:  [facility] Procedure: 2D Echo, Cardiac Doppler, Color Doppler and Strain Analysis Indications:    Atrial Fibrillation I48.91  History:        Patient has prior history of Echocardiogram examinations, most                 recent 07/01/2021. COPD and Aortic atherosclerosis,                 Signs/Symptoms:Chest Pain; Risk Factors:Hypertension, Diabetes,                 Former Smoker and Dyslipidemia.  Sonographer:    Luane School RDCS Referring Phys: [facility] IMPRESSIONS  1. Left ventricular ejection fraction, by estimation, is 60 to 65%. The left ventricle has normal function. The left ventricle has no regional wall motion abnormalities. Left ventricular diastolic parameters are consistent with Grade I diastolic dysfunction (impaired relaxation).  2. Left atrial size was mildly dilated.  3. Aortic valve regurgitation is mild. Mild aortic valve stenosis. FINDINGS  Left Ventricle: Left ventricular ejection fraction, by estimation, is 60 to 65%. The left ventricle has normal function. The left ventricle has no regional wall motion abnormalities. The left ventricular internal cavity size was normal in size. There is  no left ventricular hypertrophy. Left ventricular diastolic parameters are consistent  with Grade I diastolic dysfunction (impaired relaxation). Right Ventricle: The right ventricular size is normal. No increase in right ventricular wall thickness. Right ventricular systolic function is normal. There is normal pulmonary artery systolic pressure. The tricuspid  regurgitant velocity is 2.63 m/s, and  with an assumed right atrial pressure of 3 mmHg, the estimated right ventricular systolic pressure is 29.5 mmHg. Left Atrium: Left atrial size was mildly dilated. Right Atrium: Right atrial size was normal in size. Pericardium: There is no evidence of pericardial effusion. Mitral Valve: The mitral valve is normal in structure. Trivial mitral valve regurgitation. No evidence of mitral valve stenosis. Tricuspid Valve: The tricuspid valve is normal in structure. Tricuspid valve regurgitation is not demonstrated. No evidence of tricuspid stenosis. Aortic Valve: The aortic valve was not well visualized. Aortic valve regurgitation is mild. Aortic regurgitation PHT measures 366 msec. Mild aortic stenosis is present. Aortic valve mean gradient measures 17.8 mmHg. Aortic valve peak gradient measures 31.1 mmHg. Aortic valve area, by VTI measures 1.17 cm. Pulmonic Valve: The pulmonic valve was normal in structure. Pulmonic valve regurgitation is not visualized. No evidence of pulmonic stenosis. Aorta: The aortic root is normal in size and structure. Venous: The inferior vena cava is normal in size with greater than 50% respiratory variability, suggesting right atrial pressure of 3 mmHg. IAS/Shunts: No atrial level shunt detected by color flow Doppler.  LEFT VENTRICLE PLAX 2D LVIDd:         4.90 cm   Diastology LVIDs:         3.20 cm   LV e' medial:    5.19 cm/s LV PW:         1.10 cm   LV E/e' medial:  26.6 LV IVS:        1.10 cm   LV e' lateral:   5.68 cm/s LVOT diam:     2.00 cm   LV E/e' lateral: 24.3 LV SV:         72 LV SV Index:   36 LVOT Area:     3.14 cm  RIGHT VENTRICLE             IVC RV S prime:     10.50 cm/s  IVC diam: 1.20 cm TAPSE (M-mode): 1.8 cm LEFT ATRIUM             Index        RIGHT ATRIUM           Index LA diam:        3.90 cm 1.94 cm/m   RA Area:     13.50 cm LA Vol (A2C):   48.9 ml 24.27 ml/m  RA Volume:   33.40 ml  16.58 ml/m LA Vol (A4C):   47.7 ml 23.68  ml/m LA Biplane Vol: 52.2 ml 25.91 ml/m  AORTIC VALVE AV Area (Vmax):    1.26 cm AV Area (Vmean):   1.35 cm AV Area (VTI):     1.17 cm AV Vmax:           278.75 cm/s AV Vmean:          198.000 cm/s AV VTI:            0.614 m AV Peak Grad:      31.1 mmHg AV Mean Grad:      17.8 mmHg LVOT Vmax:         112.00 cm/s LVOT Vmean:        85.100 cm/s LVOT VTI:  0.229 m LVOT/AV VTI ratio: 0.37 AI PHT:            366 msec  AORTA Ao Root diam: 2.80 cm Ao Asc diam:  3.20 cm Ao Desc diam: 1.90 cm MITRAL VALVE                TRICUSPID VALVE MV Area (PHT): 2.00 cm     TR Peak grad:   27.7 mmHg MV Decel Time: 380 msec     TR Vmax:        263.00 cm/s MV E velocity: 138.00 cm/s MV A velocity: 168.00 cm/s  SHUNTS MV E/A ratio:  0.82         Systemic VTI:  0.23 m                             Systemic Diam: 2.00 cm Jyl Heinz MD Electronically signed by Jyl Heinz MD Signature Date/Time: 03/22/2022/1:15:46 PM    Final      Assessment and Plan:   Atrial Fibrillation She is on flecainide 50 mg BID, diltiazem 120 mg daily, Eliquis 5 mg BID. Patient still in A-fib after taking extra 60 mg diltiazem dose at 12:00.  She is anxious given multiple ED visits for similar issue. She converted back to NSR while in ED.  - will increase flecainide to 100 mg BID - schedule 2-4 week follow up with cardiology    For questions or updates, please contact [facility] Please consult [email or web address] for contact info under    Signed, Angelique Blonder, DO  03/25/2022 4:28 PM   ATTENDING ATTESTATION:  After conducting a review of all available clinical information with the care team, interviewing the patient, and performing a physical exam, I agree with the findings and plan described in this note.   The patient is a 67 year old female with a history of paroxysmal atrial fibrillation on Eliquis, hypertension, and hyperlipidemia who presents with symptomatic atrial fibrillation with rapid ventricular rate.  The  patient had been started on flecainide sometime ago by Dr. Julien Nordmann office.  She was also referred to electrophysiology for recommendations regarding potential atrial fibrillation ablation.  She was in her normal state of health up until earlier today.  She did develop symptomatic palpitations as well as a headache and shortness of breath.  She also felt lightheaded at times.  She reached out to Dr. Julien Nordmann office and was instructed to take an extra dose of diltiazem which she did.  She still felt rather unwell.  Because of this she came to the emergency department here in the emergency department her EKG demonstrated atrial fibrillation with occasional pauses of about 1 to 2 seconds.  During my interview it was noted that the patient was in normal sinus rhythm on the monitor.  She feels much better.  GEN: No acute distress.   HEENT:  MMM, no JVD, no scleral icterus Cardiac: RRR, no murmurs, rubs, or gallops.  Respiratory: Clear to auscultation bilaterally. GI: Soft, nontender, non-distended  MS: No edema; No deformity. Neuro:  Nonfocal  Vasc:  +2 radial pulses  Plan: 1.  Paroxysmal atrial fibrillation: We will increase flecainide to 100 mg twice daily with first dose now.  Patient's cardiac biomarkers and BNP are not elevated.  I think it is safe for the patient to be discharged home on his increased dose of flecainide with cardiology follow-up.  Her previous scheduled cardiology follow-up was with Dr. Geraldo Pitter in about a  month.  We will arrange for her to be seen prior to this.  She has a appointment with electrophysiology division later this year.  Continue Eliquis and diltiazem. 2.  Hypertension: Continue diltiazem: Patient's blood pressure is well controlled on her current regimen 3.  Hyperlipidemia: Continue Crestor.  Lenna Sciara, MD Pager [phone]

## 2022-03-25 NOTE — Telephone Encounter (Signed)
Pt called to say she was having an increased HR and feeling panicky. She had taken her Cardizem this morning and has extra Cardizem for increased HR. She was unsure if she should take it. Encouraged her to take the extra Cardizem as prescribed and see if that helps if not encouraged her to go to the ER. She verbalized understanding and had no further questions.

## 2022-03-25 NOTE — ED Provider Notes (Signed)
[facility] EMERGENCY DEPARTMENT Provider Note   CSN: [id number] Arrival date & time: 03/25/22  1344     History {Add pertinent medical, surgical, social history, OB history to HPI:1} Chief Complaint  Patient presents with   Atrial Fibrillation    Felicia Odom is a 66 y.o. female.  HPI Patient has history of paroxysmal atrial fibrillation.  She takes Eliquis twice daily.  She has taken her a.m. dose of Eliquis.  Reports she was out doing shopping today and quite abruptly getting palpitations.  Heart rate will be fast and then slow.  She reports she felt very lightheaded and bad.  Is nauseated.  She reports later she also then got a headache.    Home Medications Prior to Admission medications   Medication Sig Start Date End Date Taking? Authorizing Provider  flecainide (TAMBOCOR) 100 MG tablet Take 1 tablet (100 mg total) by mouth 2 (two) times daily. 03/25/22  Yes Isaiah Serge, NP  potassium chloride SA (KLOR-CON M) 20 MEQ tablet Take 1 tablet (20 mEq total) by mouth 2 (two) times daily. 03/25/22  Yes Charlesetta Shanks, MD  acetaminophen (TYLENOL) 500 MG tablet Take 1,000 mg by mouth every 6 (six) hours as needed for fever or headache.    [provider]  albuterol (PROVENTIL) (2.5 MG/3ML) 0.083% nebulizer solution USE 1 VIAL IN NEBULIZER EVERY 6 HOURS - And As Needed 03/23/22   Baird Lyons D, MD  albuterol (VENTOLIN HFA) 108 (90 Base) MCG/ACT inhaler INHALE 1 TO 2 PUFFS INTO THE LUNGS EVERY 4 HOURS AS NEEDED FOR WHEEZING OR SHORTNESS OF BREATH 12/02/21   Young, Kasandra Knudsen, MD  ALPRAZolam (XANAX) 1 MG tablet TAKE 1 TABLET BY MOUTH EVERY 6 HOURS AS NEEDED FOR ANXIETY. 01/18/22   Deneise Lever, MD  apixaban (ELIQUIS) 5 MG TABS tablet Take 1 tablet (5 mg total) by mouth 2 (two) times daily. 02/14/22   Revankar, Reita Cliche, MD  diltiazem (CARDIZEM CD) 120 MG 24 hr capsule Take 1 capsule (120 mg total) by mouth daily. 02/14/22   Revankar, Reita Cliche, MD  diltiazem  (CARDIZEM) 60 MG tablet Take 1 tablet (60 mg total) by mouth every 4 (four) hours as needed (elevated heart rate). 03/18/22   Revankar, Reita Cliche, MD  esomeprazole (NEXIUM) 40 MG capsule Take 40 mg by mouth daily as needed (Acid reflux). 11/01/21   [provider]  furosemide (LASIX) 40 MG tablet Take 40 mg by mouth 2 (two) times daily. 12/02/21   [provider]  Multiple Vitamin (MULTIVITAMIN) tablet Take 1 tablet by mouth daily.    [provider]  potassium chloride (MICRO-K) 10 MEQ CR capsule Take 10 mEq by mouth 2 (two) times daily. 12/02/21   [provider]  rosuvastatin (CRESTOR) 5 MG tablet Take 1 tablet (5 mg total) by mouth daily. 11/01/21   Revankar, Reita Cliche, MD  Tiotropium Bromide Monohydrate (SPIRIVA RESPIMAT) 1.25 MCG/ACT AERS Inhale 2 puffs into the lungs daily. 10/27/21   Deneise Lever, MD  valACYclovir (VALTREX) 1000 MG tablet Take 4,000 mg by mouth once. 02/21/22   [provider]  Vitamin D, Ergocalciferol, (DRISDOL) 1.25 MG (50000 UNIT) CAPS capsule Take 50,000 Units by mouth once a week. Fridays 06/21/21   [provider]  progesterone (PROMETRIUM) 100 MG capsule Take 100 mg by mouth daily.  10/06/11  [provider]      Allergies    No known allergies    Review of Systems  Review of Systems 10 systems reviewed negative except as per HPI Physical Exam Updated Vital Signs BP 109/64   Pulse 69   Temp 97.9 F (36.6 C) (Oral)   Resp (!) 22   Ht 5' 6.6" (1.692 m)   Wt 90.8 kg   SpO2 100%   BMI 31.73 kg/m  Physical Exam Constitutional:      Comments: Alert.  Clear mental status.  Patient is uncomfortable in appearance.  Anxious and not feeling well.  HENT:     Head: Normocephalic and atraumatic.     Mouth/Throat:     Pharynx: Oropharynx is clear.  Eyes:     Extraocular Movements: Extraocular movements intact.  Cardiovascular:     Comments: Irregularly irregular with tachycardia and  bradycardia. Pulmonary:     Effort: Pulmonary effort is normal.     Breath sounds: Normal breath sounds.  Abdominal:     General: There is no distension.     Palpations: Abdomen is soft.     Tenderness: There is no abdominal tenderness. There is no guarding.  Musculoskeletal:        General: Normal range of motion.     Right lower leg: No edema.     Left lower leg: No edema.  Skin:    General: Skin is warm and dry.  Neurological:     General: No focal deficit present.     Mental Status: She is oriented to person, place, and time.     Motor: No weakness.     Coordination: Coordination normal.  Psychiatric:        Mood and Affect: Mood normal.     Comments: Patient is somewhat anxious.  She is appropriate and cooperative.     ED Results / Procedures / Treatments   Labs (all labs ordered are listed, but only abnormal results are displayed) Labs Reviewed  BASIC METABOLIC PANEL - Abnormal; Notable for the following components:      Result Value   Glucose, Bld 116 (*)    All other components within normal limits  URINALYSIS, ROUTINE W REFLEX MICROSCOPIC - Abnormal; Notable for the following components:   Color, Urine COLORLESS (*)    Specific Gravity, Urine 1.002 (*)    Leukocytes,Ua TRACE (*)    All other components within normal limits  RAPID URINE DRUG SCREEN, HOSP PERFORMED - Abnormal; Notable for the following components:   Benzodiazepines POSITIVE (*)    All other components within normal limits  CBC WITH DIFFERENTIAL/PLATELET  BRAIN NATRIURETIC PEPTIDE  MAGNESIUM  PHOSPHORUS  TROPONIN I (HIGH SENSITIVITY)  TROPONIN I (HIGH SENSITIVITY)    EKG EKG Interpretation  Date/Time:  Friday March 25 2022 14:14:52 EDT Ventricular Rate:  115 PR Interval:    QRS Duration: 107 QT Interval:  360 QTC Calculation: 498 R Axis:   225 Text Interpretation: Right and left arm electrode reversal, interpretation assumes no reversal Atrial fibrillation Paired ventricular  premature complexes Agree. QRS morphology similar to previous but paired PVCs and pauses Confirmed by Charlesetta Shanks [phone]) on 03/25/2022 2:40:54 PM  Radiology DG Chest Port 1 View  Result Date: 03/25/2022 CLINICAL DATA:  Headache and neck pain. EXAM: PORTABLE CHEST 1 VIEW COMPARISON:  02/23/2022 FINDINGS: 1425 hours. Similar appearance of chronic atelectasis at the left base versus scarring. Right lung clear. Cardiopericardial silhouette is at upper limits of normal for size. The visualized bony structures of the thorax are unremarkable. Telemetry leads overlie the chest. IMPRESSION: Chronic atelectasis or scarring at the left base. No  acute cardiopulmonary findings. Electronically Signed   By: Misty Stanley M.D.   On: 03/25/2022 14:41    Procedures Procedures  {Document cardiac monitor, telemetry assessment procedure when appropriate:1}  Medications Ordered in ED Medications  magnesium sulfate IVPB 2 g 50 mL (0 g Intravenous Stopped 03/25/22 1651)  flecainide (TAMBOCOR) tablet 100 mg (100 mg Oral Given 03/25/22 1651)  potassium chloride SA (KLOR-CON M) CR tablet 40 mEq (40 mEq Oral Given 03/25/22 1709)    ED Course/ Medical Decision Making/ A&P                           Medical Decision Making Amount and/or Complexity of Data Reviewed Labs: ordered. Radiology: ordered.  Risk Prescription drug management.   Patient spontaneously converted just at the time of cardiology consult.  At this time she has been cleared by cardiology for discharge with increasing her flecainide dose to 100 mg twice daily, continuing her baseline Cardizem.  {Document critical care time when appropriate:1} {Document review of labs and clinical decision tools ie heart score, Chads2Vasc2 etc:1}  {Document your independent review of radiology images, and any outside records:1} {Document your discussion with family members, caretakers, and with consultants:1} {Document social determinants of health affecting pt's  care:1} {Document your decision making why or why not admission, treatments were needed:1} Final Clinical Impression(s) / ED Diagnoses Final diagnoses:  Atrial fibrillation with RVR ([place])    Rx / DC Orders ED Discharge Orders          Ordered    flecainide (TAMBOCOR) 100 MG tablet  2 times daily        03/25/22 1635    potassium chloride SA (KLOR-CON M) 20 MEQ tablet  2 times daily        03/25/22 1709

## 2022-03-25 NOTE — ED Triage Notes (Signed)
Patient coming from PCP office, was feeling like her heart was racing, found to be in 170's a PCP office.

## 2022-03-31 DIAGNOSIS — J449 Chronic obstructive pulmonary disease, unspecified: Secondary | ICD-10-CM | POA: Diagnosis not present

## 2022-04-01 ENCOUNTER — Ambulatory Visit: Payer: Medicare Other | Attending: Cardiology | Admitting: Cardiology

## 2022-04-01 ENCOUNTER — Encounter: Payer: Self-pay | Admitting: Cardiology

## 2022-04-01 VITALS — BP 150/78 | HR 69 | Ht 66.0 in | Wt 211.0 lb

## 2022-04-01 DIAGNOSIS — I480 Paroxysmal atrial fibrillation: Secondary | ICD-10-CM

## 2022-04-01 DIAGNOSIS — I10 Essential (primary) hypertension: Secondary | ICD-10-CM | POA: Diagnosis not present

## 2022-04-01 DIAGNOSIS — I700 Atherosclerosis of aorta: Secondary | ICD-10-CM

## 2022-04-01 NOTE — Patient Instructions (Signed)
Medication Instructions:  Your physician recommends that you continue on your current medications as directed. Please refer to the Current Medication list given to you today.  *If you need a refill on your cardiac medications before your next appointment, please call your pharmacy*   Lab Work: None ordered If you have labs (blood work) drawn today and your tests are completely normal, you will receive your results only by: [place] (if you have MyChart) OR A paper copy in the mail If you have any lab test that is abnormal or we need to change your treatment, we will call you to review the results.   Testing/Procedures: None ordered   Follow-Up: At [facility], you and your health needs are our priority.  As part of our continuing mission to provide you with exceptional heart care, we have created designated Provider Care Teams.  These Care Teams include your primary Cardiologist (physician) and Advanced Practice Providers (APPs -  Physician Assistants and Nurse Practitioners) who all work together to provide you with the care you need, when you need it.  We recommend signing up for the patient portal called "MyChart".  Sign up information is provided on this After Visit Summary.  MyChart is used to connect with patients for Virtual Visits (Telemedicine).  Patients are able to view lab/test results, encounter notes, upcoming appointments, etc.  Non-urgent messages can be sent to your provider as well.   To learn more about what you can do with MyChart, go to [email or web address].    Your next appointment:   As directed  The format for your next appointment:   In Person  Provider:   Jyl Heinz, MD   Other Instructions NA

## 2022-04-01 NOTE — Progress Notes (Signed)
Cardiology Office Note:    Date:  04/01/2022   ID:  Felicia Odom, DOB September 02, 1955, MRN [id number]  PCP:  Nicholos Johns, MD  Cardiologist:  Jenean Lindau, MD   Referring MD: Nicholos Johns, MD    ASSESSMENT:    1. Aortic atherosclerosis ([place])   2. PAF (paroxysmal atrial fibrillation) (HCC)    PLAN:    In order of problems listed above:  Primary prevention stressed with the patient.  Importance of compliance with diet medication stressed and she vocalized understanding. Paroxysmal atrial fibrillation:I discussed with the patient atrial fibrillation, disease process. Management and therapy including rate and rhythm control, anticoagulation benefits and potential risks were discussed extensively with the patient. Patient had multiple questions which were answered to patient's satisfaction.  She has an appointment with her electrophysiologist. Essential hypertension: Blood pressure stable and diet was emphasized. Patient has a headache and also issues with the upper neck.  I told her to go to the emergency room for further evaluation.  She is not keen on it.  I explained to her the possible risks and she understands.  She says she is going to go home and take medicine for pain and if it does not help she will go to the emergency room.  She left the office in no distress.  Medication Adjustments/Labs and Tests Ordered: Current medicines are reviewed at length with the patient today.  Concerns regarding medicines are outlined above.  No orders of the defined types were placed in this encounter.  No orders of the defined types were placed in this encounter.    No chief complaint on file.    History of Present Illness:    Felicia Odom is a 66 y.o. female.  Patient has past medical history of paroxysmal atrial fibrillation, essential hypertension and aortic atherosclerosis.  She mentions to me that her paroxysmal atrial fibrillation is better.  She is in sinus rhythm.  She  gives history of headache with also pain in the upper neck for the past several days.  She has had a couple of multiple emergency room visits for this.  Past Medical History:  Diagnosis Date   Allergic rhinitis 10/01/2010   Qualifier: Diagnosis of  By: Annamaria Boots MD, [place] D    Anxiety    Aortic atherosclerosis ([place]) 10/06/2021   Arthritis    neck   Atherosclerosis of aorta ([place]) 05/08/2013   CXR 10/115/14   Atrial fibrillation with RVR ([place]) 09/24/2021   Atypical chest pain 05/08/2013   Angina vs esophagitis   Cannabis dependence with current use ([place]) 07/26/2016   Chest pain of uncertain etiology 03/25/5175   Chronic back pain    Chronic bronchitis (HCC)    Chronic diastolic CHF (congestive heart failure) ([place])    Chronic neck pain    Cocaine abuse ([place]) 04/05/2021   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 FVC 2.69/78%, FEV1 2.04/76%, FEV1/FVC 0.76, FEF 25-75 percent 1.68/65% PFT 04/27/2016-minimal obstructive airways disease, minimal diffusion defect, insignificant response to bronchodilator. FVC 2.79/78%, FEV1 2.11/76%, ratio 0.76, FEF 25-75 percent 1.81/72%, TLC 92%, DLCO 76%.   DDD (degenerative disc disease), cervical 06/15/2015   Diverticulosis    Eczema 11/29/2017   Encounter for long-term opiate analgesic use 07/06/2017   Essential hypertension 09/25/2015   Former tobacco use 10/01/2018   Former smoker Quit 2017 36-pack-year smoking history  07/12/2018-lung cancer screening CT-lung RADS 1   GERD (gastroesophageal reflux disease)  takes Nexium daily   Helicobacter pylori gastritis 08/2019   Tx and eradicated   History of blood transfusion    in the 70's. No abnormal reaction noted   Hyperlipidemia    not on any meds bc refused to pick up from pharmacy   Hyperlipidemia with target LDL less than 100 01/21/2015   The 10-year ASCVD risk score Mikey Bussing DC Jr., et al., 2013) is: 10.4%   Values used to calculate the  score:     Age: 25 years     Sex: Female     Is Non-Hispanic African American: No     Diabetic: Yes     Tobacco smoker: No     Systolic Blood Pressure: 536 mmHg     Is BP treated: No     HDL Cholesterol: 45.1 mg/dL     Total Cholesterol: 200 mg/dL   Joint pain    Joint swelling    Long-term current use of opiate analgesic 04/05/2021   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/08/2021   Mediastinal tumor    Moderate episode of recurrent major depressive disorder ([place]) 01/28/2020   Muscle spasm 08/02/2021   Nocturia    Obesity (BMI 30.0-34.9) 12/16/2021   Obsessive-compulsive disorder 06/08/2007   Qualifier: Diagnosis of  By: June Leap     PAF (paroxysmal atrial fibrillation) ([place]) 12/16/2021   PANIC DISORDER 06/08/2007   Qualifier: Diagnosis of  By: June Leap     Pneumonia    2016   PONV (postoperative nausea and vomiting)    Primary osteoarthritis involving multiple joints 01/21/2015   Right calf pain    Routine general medical examination at a health care facility 01/28/2020   Seborrheic dermatitis of scalp 02/11/2015   Seizures ([place])    in the 80's   Thyroid nodule 07/30/2020   IMPRESSION: No significant interval change of bilateral thyroid nodules.   The above is in keeping with the ACR TI-RADS recommendations - J Am Coll Radiol 2017;14:587-595.     Electronically Signed   By: Miachel Roux M.D.   On: 08/17/2020 14:18   Type II diabetes mellitus with manifestations ([place]) 02/18/2019   The 10-year ASCVD risk score Mikey Bussing DC Jr., et al., 2013) is: 9.1%   Values used to calculate the score:     Age: 66 years     Sex: Female     Is Non-Hispanic African American: No     Diabetic: Yes     Tobacco smoker: No     Systolic Blood Pressure: 144 mmHg     Is BP treated: No     HDL Cholesterol: 46.2 mg/dL     Total Cholesterol: 180 mg/dL   URI (upper respiratory infection) 07/08/2021    Past Surgical History:  Procedure Laterality Date   COLONOSCOPY     ECTOPIC PREGNANCY SURGERY     x 2    ESOPHAGOGASTRODUODENOSCOPY      LAPAROSCOPIC LYSIS INTESTINAL ADHESIONS     x 2   RESECTION OF MEDIASTINAL MASS N/A 06/10/2016   Procedure: RESECTION OF MEDIASTINAL MASS;  Surgeon: Grace Isaac, MD;  Location: [facility];  Service: Thoracic;  Laterality: N/A;   VIDEO ASSISTED THORACOSCOPY Right 06/10/2016   Procedure: VIDEO ASSISTED THORACOSCOPY WITH PLACEMENT OF ON Verdie Mosher;  Surgeon: Grace Isaac, MD;  Location: [facility];  Service: Thoracic;  Laterality: Right;   VIDEO BRONCHOSCOPY N/A 06/10/2016   Procedure: VIDEO BRONCHOSCOPY;  Surgeon: Grace Isaac, MD;  Location: [facility];  Service: Thoracic;  Laterality: N/A;  Current Medications: Current Meds  Medication Sig   acetaminophen (TYLENOL) 500 MG tablet Take 1,000 mg by mouth every 6 (six) hours as needed for fever or headache.   albuterol (PROVENTIL) (2.5 MG/3ML) 0.083% nebulizer solution USE 1 VIAL IN NEBULIZER EVERY 6 HOURS - And As Needed   albuterol (VENTOLIN HFA) 108 (90 Base) MCG/ACT inhaler INHALE 1 TO 2 PUFFS INTO THE LUNGS EVERY 4 HOURS AS NEEDED FOR WHEEZING OR SHORTNESS OF BREATH   ALPRAZolam (XANAX) 1 MG tablet TAKE 1 TABLET BY MOUTH EVERY 6 HOURS AS NEEDED FOR ANXIETY.   apixaban (ELIQUIS) 5 MG TABS tablet Take 1 tablet (5 mg total) by mouth 2 (two) times daily.   diltiazem (CARDIZEM CD) 120 MG 24 hr capsule Take 1 capsule (120 mg total) by mouth daily.   diltiazem (CARDIZEM) 60 MG tablet Take 1 tablet (60 mg total) by mouth every 4 (four) hours as needed (elevated heart rate).   esomeprazole (NEXIUM) 40 MG capsule Take 40 mg by mouth daily as needed (Acid reflux).   flecainide (TAMBOCOR) 100 MG tablet Take 1 tablet (100 mg total) by mouth 2 (two) times daily.   fluocinonide (LIDEX) 0.05 % external solution Apply topically as needed. Dry itchy scalp   furosemide (LASIX) 40 MG tablet Take 40 mg by mouth 2 (two) times daily.   Multiple Vitamin (MULTIVITAMIN) tablet Take 1 tablet by mouth daily.   potassium chloride SA (KLOR-CON M) 20 MEQ tablet Take 1  tablet (20 mEq total) by mouth 2 (two) times daily.   rosuvastatin (CRESTOR) 5 MG tablet Take 1 tablet (5 mg total) by mouth daily.   Tiotropium Bromide Monohydrate (SPIRIVA RESPIMAT) 1.25 MCG/ACT AERS Inhale 2 puffs into the lungs daily.   valACYclovir (VALTREX) 1000 MG tablet Take 4,000 mg by mouth once.   Vitamin D, Ergocalciferol, (DRISDOL) 1.25 MG (50000 UNIT) CAPS capsule Take 50,000 Units by mouth once a week. Fridays     Allergies:   No known allergies   Social History   Socioeconomic History   Marital status: Divorced    Spouse name: Not on file   Number of children: 1   Years of education: Not on file   Highest education level: Not on file  Occupational History   Occupation: retired  Tobacco Use   Smoking status: Former    Packs/day: 1.00    Years: 36.00    Total pack years: 36.00    Types: Cigarettes    Quit date: 2017    Years since quitting: 6.6   Smokeless tobacco: Never   Tobacco comments:    last cigarette 2017  Vaping Use   Vaping Use: Never used  Substance and Sexual Activity   Alcohol use: No    Alcohol/week: 0.0 standard drinks of alcohol   Drug use: No   Sexual activity: Not on file  Other Topics Concern   Not on file  Social History Narrative   Retired [employer]   Divorced   1 son   Former smoker   2 coffee/daty   No EtOH, no drugs   Social Determinants of Radio broadcast assistant Strain: Not on file  Food Insecurity: Not on file  Transportation Needs: Not on file  Physical Activity: Not on file  Stress: Not on file  Social Connections: Not on file     Family History: The patient's family history includes Breast cancer in her sister; Clotting disorder in her mother; Colon cancer in her sister; Diabetes in her  mother; Heart attack in her father; Heart failure in her father and mother; Kidney disease in her mother; Prostate cancer in her father. There is no history of Esophageal cancer, Rectal cancer, or Stomach cancer.  ROS:    Please see the history of present illness.    All other systems reviewed and are negative.  EKGs/Labs/Other Studies Reviewed:    The following studies were reviewed today: EKG reveals sinus rhythm and nonspecific ST-T changes   Recent Labs: 06/14/2021: ALT 17 08/03/2021: TSH 1.890 03/25/2022: B Natriuretic Peptide 28.8; BUN 15; Creatinine, Ser 0.87; Hemoglobin 14.3; Magnesium 2.0; Platelets 262; Potassium 3.5; Sodium 140  Recent Lipid Panel    Component Value Date/Time   CHOL 206 (H) 06/14/2021 1122   TRIG 206 (H) 06/14/2021 1122   HDL 46 06/14/2021 1122   CHOLHDL 4.5 (H) 06/14/2021 1122   CHOLHDL 4 10/28/2020 0827   VLDL 30.2 10/28/2020 0827   LDLCALC 124 (H) 06/14/2021 1122   LDLDIRECT 142.0 01/21/2015 1537    Physical Exam:    VS:  BP (!) 150/78   Pulse 69   Ht '5\' 6"'$  (1.676 m)   Wt 211 lb (95.7 kg)   SpO2 96%   BMI 34.06 kg/m     Wt Readings from Last 3 Encounters:  04/01/22 211 lb (95.7 kg)  03/25/22 200 lb 2.8 oz (90.8 kg)  03/10/22 200 lb 3.2 oz (90.8 kg)     GEN: Patient is in no acute distress HEENT: Normal NECK: No JVD; No carotid bruits LYMPHATICS: No lymphadenopathy CARDIAC: Hear sounds regular, 2/6 systolic murmur at the apex. RESPIRATORY:  Clear to auscultation without rales, wheezing or rhonchi  ABDOMEN: Soft, non-tender, non-distended MUSCULOSKELETAL:  No edema; No deformity  SKIN: Warm and dry NEUROLOGIC:  Alert and oriented x 3 PSYCHIATRIC:  Normal affect   Signed, Jenean Lindau, MD  04/01/2022 3:54 PM    [place]

## 2022-04-04 ENCOUNTER — Emergency Department (HOSPITAL_COMMUNITY): Payer: Medicare Other

## 2022-04-04 ENCOUNTER — Other Ambulatory Visit: Payer: Self-pay

## 2022-04-04 ENCOUNTER — Emergency Department (HOSPITAL_COMMUNITY)
Admission: EM | Admit: 2022-04-04 | Discharge: 2022-04-04 | Disposition: A | Discharge status: Home / Self Care | Payer: Medicare Other | Attending: Emergency Medicine | Admitting: Emergency Medicine

## 2022-04-04 ENCOUNTER — Telehealth: Payer: Self-pay | Admitting: Cardiology

## 2022-04-04 ENCOUNTER — Encounter (HOSPITAL_COMMUNITY): Payer: Self-pay

## 2022-04-04 DIAGNOSIS — E876 Hypokalemia: Secondary | ICD-10-CM | POA: Diagnosis not present

## 2022-04-04 DIAGNOSIS — I4892 Unspecified atrial flutter: Secondary | ICD-10-CM

## 2022-04-04 DIAGNOSIS — Z09 Encounter for follow-up examination after completed treatment for conditions other than malignant neoplasm: Secondary | ICD-10-CM | POA: Diagnosis not present

## 2022-04-04 DIAGNOSIS — M7989 Other specified soft tissue disorders: Secondary | ICD-10-CM | POA: Diagnosis not present

## 2022-04-04 DIAGNOSIS — I4891 Unspecified atrial fibrillation: Secondary | ICD-10-CM | POA: Diagnosis not present

## 2022-04-04 DIAGNOSIS — I10 Essential (primary) hypertension: Secondary | ICD-10-CM | POA: Diagnosis not present

## 2022-04-04 DIAGNOSIS — I480 Paroxysmal atrial fibrillation: Secondary | ICD-10-CM | POA: Diagnosis not present

## 2022-04-04 DIAGNOSIS — I499 Cardiac arrhythmia, unspecified: Secondary | ICD-10-CM | POA: Diagnosis not present

## 2022-04-04 DIAGNOSIS — R079 Chest pain, unspecified: Secondary | ICD-10-CM | POA: Diagnosis not present

## 2022-04-04 DIAGNOSIS — R600 Localized edema: Secondary | ICD-10-CM | POA: Insufficient documentation

## 2022-04-04 DIAGNOSIS — R6889 Other general symptoms and signs: Secondary | ICD-10-CM | POA: Diagnosis not present

## 2022-04-04 DIAGNOSIS — R1110 Vomiting, unspecified: Secondary | ICD-10-CM | POA: Diagnosis not present

## 2022-04-04 DIAGNOSIS — Z743 Need for continuous supervision: Secondary | ICD-10-CM | POA: Diagnosis not present

## 2022-04-04 LAB — COMPREHENSIVE METABOLIC PANEL
ALT: 17 U/L (ref 0–44)
AST: 19 U/L (ref 15–41)
Albumin: 3.5 g/dL (ref 3.5–5.0)
Alkaline Phosphatase: 72 U/L (ref 38–126)
Anion gap: 10 (ref 5–15)
BUN: 17 mg/dL (ref 8–23)
CO2: 25 mmol/L (ref 22–32)
Calcium: 8.8 mg/dL — ABNORMAL LOW (ref 8.9–10.3)
Chloride: 103 mmol/L (ref 98–111)
Creatinine, Ser: 0.83 mg/dL (ref 0.44–1.00)
GFR, Estimated: >60 mL/min (ref >60)
Glucose, Bld: 200 mg/dL — ABNORMAL HIGH (ref 70–99)
Potassium: 3.4 mmol/L — ABNORMAL LOW (ref 3.5–5.1)
Sodium: 138 mmol/L (ref 135–145)
Total Bilirubin: 0.7 mg/dL (ref 0.3–1.2)
Total Protein: 6.7 g/dL (ref 6.5–8.1)

## 2022-04-04 LAB — CBC
HCT: 43 % (ref 36.0–46.0)
Hemoglobin: 14.3 g/dL (ref 12.0–15.0)
MCH: 28.7 pg (ref 26.0–34.0)
MCHC: 33.3 g/dL (ref 30.0–36.0)
MCV: 86.2 fL (ref 80.0–100.0)
Platelets: 279 10*3/uL (ref 150–400)
RBC: 4.99 MIL/uL (ref 3.87–5.11)
RDW: 13 % (ref 11.5–15.5)
WBC: 8.1 10*3/uL (ref 4.0–10.5)
nRBC: 0 % (ref 0.0–0.2)

## 2022-04-04 LAB — BRAIN NATRIURETIC PEPTIDE: B Natriuretic Peptide: 59.9 pg/mL (ref 0.0–100.0)

## 2022-04-04 LAB — TROPONIN I (HIGH SENSITIVITY)
Troponin I (High Sensitivity): 8 ng/L (ref <18)
Troponin I (High Sensitivity): 11 ng/L (ref <18)

## 2022-04-04 LAB — MAGNESIUM: Magnesium: 1.9 mg/dL (ref 1.7–2.4)

## 2022-04-04 MED ORDER — DILTIAZEM HCL-DEXTROSE 125-5 MG/125ML-% IV SOLN (PREMIX)
5.0000 mg/h | INTRAVENOUS | Status: DC
  Administered 2022-04-04: 5 mg/h via INTRAVENOUS
  Filled 2022-04-04: qty 125

## 2022-04-04 MED ORDER — DILTIAZEM LOAD VIA INFUSION
10.0000 mg | Freq: Once | INTRAVENOUS | Status: AC
  Administered 2022-04-04: 10 mg via INTRAVENOUS
  Filled 2022-04-04: qty 10

## 2022-04-04 MED ORDER — SPIRONOLACTONE 25 MG PO TABS: 12.5000 mg | ORAL_TABLET | Freq: Every day | ORAL | 0 refills | Status: DC

## 2022-04-04 MED ORDER — POTASSIUM CHLORIDE 10 MEQ/100ML IV SOLN
10.0000 meq | Freq: Once | INTRAVENOUS | Status: AC
  Administered 2022-04-04: 10 meq via INTRAVENOUS
  Filled 2022-04-04: qty 100

## 2022-04-04 MED ORDER — FLECAINIDE ACETATE 50 MG PO TABS
100.0000 mg | ORAL_TABLET | Freq: Once | ORAL | Status: AC
  Administered 2022-04-04: 100 mg via ORAL
  Filled 2022-04-04: qty 2

## 2022-04-04 MED ORDER — SPIRONOLACTONE 12.5 MG HALF TABLET
12.5000 mg | ORAL_TABLET | Freq: Once | ORAL | Status: AC
  Administered 2022-04-04: 12.5 mg via ORAL
  Filled 2022-04-04: qty 1

## 2022-04-04 NOTE — ED Notes (Signed)
Cardiology at bedside.

## 2022-04-04 NOTE — Consult Note (Signed)
Cardiology Consultation   Patient ID: [name] MRN: [id number]; DOB: February 11, 1956  Admit date: 04/04/2022 Date of Consult: 04/05/2022  PCP:  Felicia Odom, [facility] Prov[facility] Cardiologist:  Felicia Lindau, MD        Patient Profile:   Felicia Odom is a 66 y.o. female with a hx of A-fib on eliquis, HTN, HLD, anxiety who is being seen 04/05/2022 for the evaluation of af rvr at the request of ED.  History of Present Illness:   Felicia Odom is a very pleasant 66 year old occasion female with known history of paroxysmal A-fib on anticoagulation with CHA2DS2-VASc of 3 on Eliquis, diltiazem and flecainide for rate and rhythm control.  Otherwise past medical history of hypertension hyperlipidemia and anxiety as well as prior smoking complicated by COPD.  Multiple admissions (at least 3 over the last months 5 since the beginning of the year) to the emergency room with A-fib RVR most likely in the setting of hypokalemia.  Patient reports difficulties tolerating p.o. potassium supplement.  Patient was previously referred for EP evaluation-schedule outpatient appointment. Following presentation to emergency room patient was found to be in A-fib RVR she was started on IV Cardizem with rapid conversion to normal sinus rhythm.  Initial labs significant for hypokalemia.  In addition physical exam significant for audible wheezing   Past Medical History:  Diagnosis Date   Allergic rhinitis 10/01/2010   Qualifier: Diagnosis of  By: Felicia Boots MD, [place] D    Anxiety    Aortic atherosclerosis ([place]) 10/06/2021   Arthritis    neck   Atherosclerosis of aorta ([place]) 05/08/2013   CXR 10/115/14   Atrial fibrillation with RVR ([place]) 09/24/2021   Atypical chest pain 05/08/2013   Angina vs esophagitis   Cannabis dependence with current use ([place]) 07/26/2016   Chest pain of uncertain etiology 01/18/349   Chronic back pain    Chronic bronchitis (H[facility])    Chronic diastolic CHF  (congestive heart failure) ([place])    Chronic neck pain    Cocaine abuse ([place]) 04/05/2021   COPD mixed type ([place]) 02/18/2019   Office Spirometry 10/21/2015-slight restriction of exhaled volume, mild obstruction. FVC 2.69/78%, FEV1 2.04/76%, FEV1/FVC 0.76, FEF 25-75 percent 1.68/65% PFT 04/27/2016-minimal obstructive airways disease, minimal diffusion defect, insignificant response to bronchodilator. FVC 2.79/78%, FEV1 2.11/76%, ratio 0.76, FEF 25-75 percent 1.81/72%, TLC 92%, DLCO 76%.   DDD (degenerative disc disease), cervical 06/15/2015   Diverticulosis    Eczema 11/29/2017   Encounter for long-term opiate analgesic use 07/06/2017   Essential hypertension 09/25/2015   Former tobacco use 10/01/2018   Former smoker Quit 2017 36-pack-year smoking history  07/12/2018-lung cancer screening CT-lung RADS 1   GERD (gastroesophageal reflux disease)    takes Nexium daily   Helicobacter pylori gastritis 08/2019   Tx and eradicated   History of blood transfusion    in the 70's. No abnormal reaction noted   Hyperlipidemia    not on any meds bc refused to pick up from pharmacy   Hyperlipidemia with target LDL less than 100 01/21/2015   The 10-year ASCVD risk score Felicia Bussing DC Jr., et al., 2013) is: 10.4%   Values used to calculate the score:     Age: 67 years     Sex: Female     Is Non-Hispanic African American: No     Diabetic: Yes     Tobacco smoker: No     Systolic Blood Pressure: 093 mmHg     Is BP  treated: No     HDL Cholesterol: 45.1 mg/dL     Total Cholesterol: 200 mg/dL   Joint pain    Joint swelling    Long-term current use of opiate analgesic 04/05/2021   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/08/2021   Mediastinal tumor    Moderate episode of recurrent major depressive disorder ([place]) 01/28/2020   Muscle spasm 08/02/2021   Nocturia    Obesity (BMI 30.0-34.9) 12/16/2021   Obsessive-compulsive disorder 06/08/2007   Qualifier: Diagnosis of  By: June Leap     PAF (paroxysmal atrial fibrillation) ([place]) 12/16/2021   PANIC  DISORDER 06/08/2007   Qualifier: Diagnosis of  By: June Leap     Pneumonia    2016   PONV (postoperative nausea and vomiting)    Primary osteoarthritis involving multiple joints 01/21/2015   Right calf pain    Routine general medical examination at a health care facility 01/28/2020   Seborrheic dermatitis of scalp 02/11/2015   Seizures ([place])    in the 80's   Thyroid nodule 07/30/2020   IMPRESSION: No significant interval change of bilateral thyroid nodules.   The above is in keeping with the ACR TI-RADS recommendations - J Am Coll Radiol 2017;14:587-595.     Electronically Signed   By: Felicia Odom M.D.   On: 08/17/2020 14:18   Type II diabetes mellitus with manifestations ([place]) 02/18/2019   The 10-year ASCVD risk score Felicia Bussing DC Jr., et al., 2013) is: 9.1%   Values used to calculate the score:     Age: 69 years     Sex: Female     Is Non-Hispanic African American: No     Diabetic: Yes     Tobacco smoker: No     Systolic Blood Pressure: 503 mmHg     Is BP treated: No     HDL Cholesterol: 46.2 mg/dL     Total Cholesterol: 180 mg/dL   URI (upper respiratory infection) 07/08/2021    Past Surgical History:  Procedure Laterality Date   COLONOSCOPY     ECTOPIC PREGNANCY SURGERY     x 2    ESOPHAGOGASTRODUODENOSCOPY     LAPAROSCOPIC LYSIS INTESTINAL ADHESIONS     x 2   RESECTION OF MEDIASTINAL MASS N/A 06/10/2016   Procedure: RESECTION OF MEDIASTINAL MASS;  Surgeon: Grace Isaac, MD;  Location: [facility];  Service: Thoracic;  Laterality: N/A;   VIDEO ASSISTED THORACOSCOPY Right 06/10/2016   Procedure: VIDEO ASSISTED THORACOSCOPY WITH PLACEMENT OF ON Q TUNNELER;  Surgeon: Grace Isaac, MD;  Location: [facility];  Service: Thoracic;  Laterality: Right;   VIDEO BRONCHOSCOPY N/A 06/10/2016   Procedure: VIDEO BRONCHOSCOPY;  Surgeon: Grace Isaac, MD;  Location: [facility];  Service: Thoracic;  Laterality: N/A;     Home Medications:  Prior to Admission medications   Medication Sig Start Date End  Date Taking? Authorizing Provider  spironolactone (ALDACTONE) 25 MG tablet Take 0.5 tablets (12.5 mg total) by mouth daily. 04/04/22  Yes Felicia Belling, MD  acetaminophen (TYLENOL) 500 MG tablet Take 1,000 mg by mouth every 6 (six) hours as needed for fever or headache.    [provider]  albuterol (PROVENTIL) (2.5 MG/3ML) 0.083% nebulizer solution USE 1 VIAL IN NEBULIZER EVERY 6 HOURS - And As Needed 03/23/22   Baird Lyons D, MD  albuterol (VENTOLIN HFA) 108 (90 Base) MCG/ACT inhaler INHALE 1 TO 2 PUFFS INTO THE LUNGS EVERY 4 HOURS AS NEEDED FOR WHEEZING OR SHORTNESS OF BREATH 12/02/21   Felicia Odom, [place]  D, MD  ALPRAZolam (XANAX) 1 MG tablet TAKE 1 TABLET BY MOUTH EVERY 6 HOURS AS NEEDED FOR ANXIETY. 01/18/22   Young, Kasandra Knudsen, MD  apixaban (ELIQUIS) 5 MG TABS tablet Take 1 tablet (5 mg total) by mouth 2 (two) times daily. 02/14/22   Revankar, Reita Cliche, MD  diltiazem (CARDIZEM CD) 120 MG 24 hr capsule Take 1 capsule (120 mg total) by mouth daily. 02/14/22   Revankar, Reita Cliche, MD  diltiazem (CARDIZEM) 60 MG tablet Take 1 tablet (60 mg total) by mouth every 4 (four) hours as needed (elevated heart rate). 03/18/22   Revankar, Reita Cliche, MD  esomeprazole (NEXIUM) 40 MG capsule Take 40 mg by mouth daily as needed (Acid reflux). 11/01/21   [provider]  flecainide (TAMBOCOR) 100 MG tablet Take 1 tablet (100 mg total) by mouth 2 (two) times daily. 03/25/22   Isaiah Serge, NP  fluocinonide (LIDEX) 0.05 % external solution Apply topically as needed. Dry itchy scalp 03/31/22   [provider]  furosemide (LASIX) 40 MG tablet Take 40 mg by mouth 2 (two) times daily. 12/02/21   [provider]  Multiple Vitamin (MULTIVITAMIN) tablet Take 1 tablet by mouth daily.    [provider]  rosuvastatin (CRESTOR) 5 MG tablet Take 1 tablet (5 mg total) by mouth daily. 11/01/21   Revankar, Reita Cliche, MD  Tiotropium Bromide Monohydrate (SPIRIVA RESPIMAT) 1.25 MCG/ACT AERS Inhale 2  puffs into the lungs daily. 10/27/21   Deneise Lever, MD  valACYclovir (VALTREX) 1000 MG tablet Take 4,000 mg by mouth once. 02/21/22   [provider]  Vitamin D, Ergocalciferol, (DRISDOL) 1.25 MG (50000 UNIT) CAPS capsule Take 50,000 Units by mouth once a week. Fridays 06/21/21   [provider]    Inpatient Medications: Scheduled Meds:  Continuous Infusions:   PRN Meds:   Allergies:    Allergies  Allergen Reactions   No Known Allergies    Sulfa Antibiotics Hives    Social History:   Social History   Socioeconomic History   Marital status: Divorced    Spouse name: Not on file   Number of children: 1   Years of education: Not on file   Highest education level: Not on file  Occupational History   Occupation: retired  Tobacco Use   Smoking status: Former    Packs/day: 1.00    Years: 36.00    Total pack years: 36.00    Types: Cigarettes    Quit date: 2017    Years since quitting: 6.6   Smokeless tobacco: Never   Tobacco comments:    last cigarette 2017  Vaping Use   Vaping Use: Never used  Substance and Sexual Activity   Alcohol use: No    Alcohol/week: 0.0 standard drinks of alcohol   Drug use: No   Sexual activity: Not on file  Other Topics Concern   Not on file  Social History Narrative   Retired [employer]   Divorced   1 son   Former smoker   2 coffee/daty   No EtOH, no drugs   Social Determinants of Radio broadcast assistant Strain: Not on file  Food Insecurity: Not on file  Transportation Needs: Not on file  Physical Activity: Not on file  Stress: Not on file  Social Connections: Not on file  Intimate Partner Violence: Not on file    Family History:    Family History  Problem Relation Age of Onset   Heart  attack Father    Prostate cancer Father    Heart failure Father    Diabetes Mother    Clotting disorder Mother        PE   Heart failure Mother    Kidney disease Mother    Breast cancer Sister    Colon  cancer Sister    Esophageal cancer Neg Hx    Rectal cancer Neg Hx    Stomach cancer Neg Hx      ROS:  Please see the history of present illness.   All other ROS reviewed and negative.     Physical Exam/Data:   Vitals:   04/04/22 2230 04/04/22 2245 04/04/22 2300 04/04/22 2322  BP: 137/81  131/76   Pulse: 71 70 76   Resp: (!) 27 (!) 25 18   Temp:    97.9 F (36.6 C)  TempSrc:    Oral  SpO2: 98% 97% 98%   Weight:      Height:        Intake/Output Summary (Last 24 hours) at 04/05/2022 0055 Last data filed at 04/04/2022 2100 Gross per 24 hour  Intake 119.15 ml  Output --  Net 119.15 ml      04/04/2022    6:14 PM 04/01/2022    3:28 PM 03/25/2022    1:55 PM  Last 3 Weights  Weight (lbs) 200 lb 211 lb 200 lb 2.8 oz  Weight (kg) 90.719 kg 95.709 kg 90.8 kg     Body mass index is 32.28 kg/m.  General:  Well nourished, well developed, in no acute distress HEENT: normal Neck: no JVD Vascular: No carotid bruits; Distal pulses 2+ bilaterally Cardiac:  normal S1, S2; RRR; no murmur  Lungs:  clear to auscultation bilaterally, no wheezing, rhonchi or rales  Abd: soft, nontender, no hepatomegaly  Ext: no edema Musculoskeletal:  No deformities, BUE and BLE strength normal and equal Skin: warm and dry  Neuro:  CNs 2-12 intact, no focal abnormalities noted Psych:  Normal affect   EKG:  The EKG was personally reviewed and demonstrates: A-fib RVR on presentation.  Subsequent EKG normal sinus rhythm Telemetry:  Telemetry was personally reviewed and demonstrates: A-fib by RVR converted to normal sinus  Relevant CV Studies: Relevant CV Studies: 03/22/22 Echo IMPRESSIONS   1. Left ventricular ejection fraction, by estimation, is 60 to 65%. The  left ventricle has normal function. The left ventricle has no regional  wall motion abnormalities. Left ventricular diastolic parameters are  consistent with Grade I diastolic  dysfunction (impaired relaxation).   2. Left atrial size was  mildly dilated.   3. Aortic valve regurgitation is mild. Mild aortic valve stenosis.  Patch Wear Time:  6 days and 21 hours (2023-07-24T09:37:51-0400 to 2023-07-31T06:58:49-0400)   Patient had a min HR of 52 bpm, max HR of 190 bpm, and avg HR of 78 bpm. Predominant underlying rhythm was Sinus Rhythm.    There were no episodes of sustained bradycardia sinus pauses of 3 seconds or greater or second or third-degree AV block.   5 Ventricular Tachycardia runs occurred, the run with the fastest interval lasting 7 beats with a max rate of 190 bpm (avg 174 bpm);  the run with the fastest interval was also the longest.    Atrial Fibrillation/Flutter 1 event lasting 5 hours and 24 minutes occurred (3% burden), ranging from 87-178 bpm (avg of 135 bpm), the longest lasting 5 hours 24 mins with an avg rate of 135 bpm. Heart rate during atrial fibrillation  exceeded 110 bpm 87% of the time.   Atrial Fibrillation/Flutter was detected within +/- 45 seconds of symptomatic patient event(s). Isolated SVEs were rare (<1.0%), SVE Couplets were rare (<1.0%), and SVE Triplets were rare (<1.0%).    Isolated VEs were rare (<1.0%, 37), VE Couplets were rare (<1.0%, 72), and VE Triplets were rare (<1.0%, 41).   There were 3 triggered and 3 diary events associated with atrial fibrillation with rapid rates of 95 to 168 bpm.  The study is normal. The study is low risk.   Left ventricular function is normal. Nuclear stress EF: 76 %. The left ventricular ejection fraction is hyperdynamic (>65%). End diastolic cavity size is normal.   Good exercise capacity.  Patient had leg pain which is why the test was stopped and changed to [facility]   Laboratory Data:  High Sensitivity Troponin:   Recent Labs  Lab 03/25/22 1417 03/25/22 1534 04/04/22 1816 04/04/22 2027  TROPONINIHS '10 10 8 11     '$ Chemistry Recent Labs  Lab 04/04/22 1816  NA 138  K 3.4*  CL 103  CO2 25  GLUCOSE 200*  BUN 17  CREATININE 0.83  CALCIUM  8.8*  MG 1.9  GFRNONAA >60  ANIONGAP 10    Recent Labs  Lab 04/04/22 1816  PROT 6.7  ALBUMIN 3.5  AST 19  ALT 17  ALKPHOS 72  BILITOT 0.7   Lipids No results for input(s): "CHOL", "TRIG", "HDL", "LABVLDL", "LDLCALC", "CHOLHDL" in the last 168 hours.  Hematology Recent Labs  Lab 04/04/22 1816  WBC 8.1  RBC 4.99  HGB 14.3  HCT 43.0  MCV 86.2  MCH 28.7  MCHC 33.3  RDW 13.0  PLT 279   Thyroid No results for input(s): "TSH", "FREET4" in the last 168 hours.  BNP Recent Labs  Lab 04/04/22 1816  BNP 59.9    DDimer No results for input(s): "DDIMER" in the last 168 hours.   Radiology/Studies:  DG Chest Portable 1 View  Result Date: 04/04/2022 CLINICAL DATA:  Chest pain EXAM: PORTABLE CHEST 1 VIEW COMPARISON:  March 25, 2022, August 26, 2021 FINDINGS: The cardiomediastinal contours are stable. Unchanged calcified aortic atherosclerosis. Bibasilar reticulonodular pulmonary opacities. No large pleural effusion or pneumothorax. The visualized upper abdomen is unremarkable. No acute osseous abnormality. IMPRESSION: Bibasilar pulmonary opacities. Differential considerations include atelectasis, aspiration, or infection. Electronically Signed   By: Beryle Flock M.D.   On: 04/04/2022 18:58     Assessment and Plan:   Paroxysmal atrial fibrillation: CHA2DS2-VASc score of 3, has bled of 1 currently on flecainide 100 mg twice daily and Eliquis and diltiazem.  Current episode is probably related to hyperkalemia.  In addition patient is noted to be wheezing on exam.  Probably in the setting of acute on chronic COPD?  She is euvolemic on my exam.  She is warm.  Given that multiple prior admissions related to electrolyte derangements patient is unable to tolerate p.o. K.  We discussed to start patient on spironolactone.  Dose of spironolactone 12.5 mg p.o. now and then BMP in 1 week.  Patient TTE reviewed with E over E prime suggestive of markedly elevated filling pressures Continue  with Eliquis. Follow-up with EP.  Patient is a good candidate for A-fib ablations.  She is very interested. HF2PEF score above 7 corresponds to more than 95% probability of HFpEF Patient probably will benefit from Jardiance initiation as an outpatient. Daily weight    Hypertension: Continue diltiazem: Start patient spironolactone 12.5 mg p.o. daily.  Patient was educated as to contact the office if she starts feeling dizzy lightheaded or her blood pressure starts running consistently below 100. Patient was recommended to continue with BP log  Hyperlipidemia: Continue Crestor.  4. Lower leg edema.  Obesity. Chronic venous insufficiency? Patient reports multiple occasions of below-knee leg swelling, currently on Lasix. Patient was educated about raising her legs.  Compression stockings. Patient should be referred to primary care doctor and discussed possibility of weight loss medication such as GLP-1 agonist Patient requested outpatient referral to PCP  Acute on chronic COPD exacerbation?Marland Kitchen Audible wheeze. BNP 60 Management as per primary team  Risk Assessment/Risk Scores:          CHA2DS2-VASc Score = 23  This indicates a 2.2% annual risk of stroke. The patient's score is based upon: CHF History: 0 HTN History: 0 Diabetes History: 0 Stroke History: 0 Vascular Disease History: 0 Age Score: 1 Gender Score: 1          For questions or updates, please contact [place] Please consult [email or web address] for contact info under    Signed, Warren Danes, MD  04/05/2022 12:55 AM

## 2022-04-04 NOTE — ED Provider Notes (Signed)
[facility] EMERGENCY DEPARTMENT Provider Note   CSN: [id number] Arrival date & time: 04/04/22  1806     History {Add pertinent medical, surgical, social history, OB history to HPI:1} Chief Complaint  Patient presents with   Chest Pain   Shortness of Breath    Felicia Odom is a 66 y.o. female.   Chest Pain Associated symptoms: shortness of breath   Shortness of Breath Associated symptoms: chest pain   Patient presented chest pain shortness of breath.  Began on Friday with today being Monday.  Has a history of atrial fibrillation and feels that she is in.  Feels lightheaded.  Is on blood thinners.  States she has not been on her potassium or Lasix as much.  States her potassium gave her rash on her legs.  States she just saw cardiology on Friday but she is switching cardiologists.  Has an appointment to see electrophysiology but that is not for a couple months.     Home Medications Prior to Admission medications   Medication Sig Start Date End Date Taking? Authorizing Provider  acetaminophen (TYLENOL) 500 MG tablet Take 1,000 mg by mouth every 6 (six) hours as needed for fever or headache.    [provider]  albuterol (PROVENTIL) (2.5 MG/3ML) 0.083% nebulizer solution USE 1 VIAL IN NEBULIZER EVERY 6 HOURS - And As Needed 03/23/22   Baird Lyons D, MD  albuterol (VENTOLIN HFA) 108 (90 Base) MCG/ACT inhaler INHALE 1 TO 2 PUFFS INTO THE LUNGS EVERY 4 HOURS AS NEEDED FOR WHEEZING OR SHORTNESS OF BREATH 12/02/21   Young, Kasandra Knudsen, MD  ALPRAZolam (XANAX) 1 MG tablet TAKE 1 TABLET BY MOUTH EVERY 6 HOURS AS NEEDED FOR ANXIETY. 01/18/22   Deneise Lever, MD  apixaban (ELIQUIS) 5 MG TABS tablet Take 1 tablet (5 mg total) by mouth 2 (two) times daily. 02/14/22   Revankar, Reita Cliche, MD  diltiazem (CARDIZEM CD) 120 MG 24 hr capsule Take 1 capsule (120 mg total) by mouth daily. 02/14/22   Revankar, Reita Cliche, MD  diltiazem (CARDIZEM) 60 MG tablet Take 1 tablet  (60 mg total) by mouth every 4 (four) hours as needed (elevated heart rate). 03/18/22   Revankar, Reita Cliche, MD  esomeprazole (NEXIUM) 40 MG capsule Take 40 mg by mouth daily as needed (Acid reflux). 11/01/21   [provider]  flecainide (TAMBOCOR) 100 MG tablet Take 1 tablet (100 mg total) by mouth 2 (two) times daily. 03/25/22   Isaiah Serge, NP  fluocinonide (LIDEX) 0.05 % external solution Apply topically as needed. Dry itchy scalp 03/31/22   [provider]  furosemide (LASIX) 40 MG tablet Take 40 mg by mouth 2 (two) times daily. 12/02/21   [provider]  Multiple Vitamin (MULTIVITAMIN) tablet Take 1 tablet by mouth daily.    [provider]  potassium chloride SA (KLOR-CON M) 20 MEQ tablet Take 1 tablet (20 mEq total) by mouth 2 (two) times daily. 03/25/22   Charlesetta Shanks, MD  rosuvastatin (CRESTOR) 5 MG tablet Take 1 tablet (5 mg total) by mouth daily. 11/01/21   Revankar, Reita Cliche, MD  Tiotropium Bromide Monohydrate (SPIRIVA RESPIMAT) 1.25 MCG/ACT AERS Inhale 2 puffs into the lungs daily. 10/27/21   Deneise Lever, MD  valACYclovir (VALTREX) 1000 MG tablet Take 4,000 mg by mouth once. 02/21/22   [provider]  Vitamin D, Ergocalciferol, (DRISDOL) 1.25 MG (50000 UNIT) CAPS capsule Take 50,000 Units by mouth once a week. Fridays 06/21/21  [provider]      Allergies    No known allergies and Sulfa antibiotics    Review of Systems   Review of Systems  Respiratory:  Positive for shortness of breath.   Cardiovascular:  Positive for chest pain.    Physical Exam Updated Vital Signs BP 139/85   Pulse 73   Temp 97.6 F (36.4 C) (Oral)   Resp (!) 25   Ht '5\' 6"'$  (1.676 m)   Wt 90.7 kg   SpO2 98%   BMI 32.28 kg/m  Physical Exam Vitals and nursing note reviewed.  Cardiovascular:     Rate and Rhythm: Tachycardia present. Rhythm irregular.     Heart sounds: No murmur heard. Pulmonary:     Effort: No tachypnea.  Chest:     Chest  wall: No tenderness.  Abdominal:     Tenderness: There is no abdominal tenderness.  Musculoskeletal:     Right lower leg: Edema present.     Left lower leg: Edema present.     Comments: Mild edema bilateral lower extremities.  Neurological:     Mental Status: She is alert.     ED Results / Procedures / Treatments   Labs (all labs ordered are listed, but only abnormal results are displayed) Labs Reviewed  COMPREHENSIVE METABOLIC PANEL - Abnormal; Notable for the following components:      Result Value   Potassium 3.4 (*)    Glucose, Bld 200 (*)    Calcium 8.8 (*)    All other components within normal limits  CBC  MAGNESIUM  BRAIN NATRIURETIC PEPTIDE  TROPONIN I (HIGH SENSITIVITY)  TROPONIN I (HIGH SENSITIVITY)    EKG None  Radiology DG Chest Portable 1 View  Result Date: 04/04/2022 CLINICAL DATA:  Chest pain EXAM: PORTABLE CHEST 1 VIEW COMPARISON:  March 25, 2022, August 26, 2021 FINDINGS: The cardiomediastinal contours are stable. Unchanged calcified aortic atherosclerosis. Bibasilar reticulonodular pulmonary opacities. No large pleural effusion or pneumothorax. The visualized upper abdomen is unremarkable. No acute osseous abnormality. IMPRESSION: Bibasilar pulmonary opacities. Differential considerations include atelectasis, aspiration, or infection. Electronically Signed   By: Beryle Flock M.D.   On: 04/04/2022 18:58    Procedures Procedures  {Document cardiac monitor, telemetry assessment procedure when appropriate:1}  Medications Ordered in ED Medications  diltiazem (CARDIZEM) 1 mg/mL load via infusion 10 mg (10 mg Intravenous Bolus from Bag 04/04/22 1837)  potassium chloride 10 mEq in 100 mL IVPB (10 mEq Intravenous New Bag/Given 04/04/22 1937)  flecainide (TAMBOCOR) tablet 100 mg (100 mg Oral Given 04/04/22 2035)    ED Course/ Medical Decision Making/ A&P                           Medical Decision Making Amount and/or Complexity of Data Reviewed Labs:  ordered. Radiology: ordered.  Risk Prescription drug management.   ***  {Document critical care time when appropriate:1} {Document review of labs and clinical decision tools ie heart score, Chads2Vasc2 etc:1}  {Document your independent review of radiology images, and any outside records:1} {Document your discussion with family members, caretakers, and with consultants:1} {Document social determinants of health affecting pt's care:1} {Document your decision making why or why not admission, treatments were needed:1} Final Clinical Impression(s) / ED Diagnoses Final diagnoses:  None    Rx / DC Orders ED Discharge Orders     None

## 2022-04-04 NOTE — ED Triage Notes (Signed)
Pt arrives via EMS from home. Pt reports Chest pain, sob, and lightheadedness since Friday.

## 2022-04-04 NOTE — Discharge Instructions (Addendum)
Stop taking the potassium for right now.  Take the new medicine.  Have the potassium checked in the next few days and your doctors can help supplement after that if needed.

## 2022-04-04 NOTE — Telephone Encounter (Signed)
Patient is requesting to switch providers from Dr. Geraldo Pitter to Dr. Percival Spanish.

## 2022-04-05 ENCOUNTER — Encounter: Payer: Self-pay | Admitting: Physician Assistant

## 2022-04-05 ENCOUNTER — Ambulatory Visit: Payer: Medicare Other | Admitting: Cardiology

## 2022-04-05 ENCOUNTER — Ambulatory Visit: Payer: Medicare Other | Attending: Cardiology | Admitting: Cardiology

## 2022-04-05 ENCOUNTER — Encounter: Payer: Self-pay | Admitting: Cardiology

## 2022-04-05 VITALS — BP 130/72 | HR 80 | Ht 66.0 in | Wt 211.0 lb

## 2022-04-05 DIAGNOSIS — I480 Paroxysmal atrial fibrillation: Secondary | ICD-10-CM | POA: Diagnosis not present

## 2022-04-05 DIAGNOSIS — I700 Atherosclerosis of aorta: Secondary | ICD-10-CM

## 2022-04-05 MED ORDER — DILTIAZEM HCL 30 MG PO TABS: 30.0000 mg | ORAL_TABLET | ORAL | 3 refills | Status: DC | PRN

## 2022-04-05 MED ORDER — SPIRONOLACTONE 25 MG PO TABS: 12.5000 mg | ORAL_TABLET | Freq: Every day | ORAL | 0 refills | Status: DC

## 2022-04-05 NOTE — Progress Notes (Signed)
Received s/o from cardiology fellow about 1 week BMET and f/u. Staff msg sent to [place]/[facility] team to assist. If no availability in that location, would be appropriate to see afib clinic in the interim and f/u with EP as scheduled next month.

## 2022-04-05 NOTE — Progress Notes (Signed)
Cardiology Office Note   Date:  04/05/2022   ID:  Felicia, Odom [birth year], MRN [id number]  PCP:  Roselee Nova, MD  Cardiologist:   Minus Breeding, MD Referring:  Roselee Nova, MD  Chief Complaint  Patient presents with   Palpitations      History of Present Illness: Felicia Odom is a 66 y.o. female who presents for   evaluation of atrial fib.    She was in the ED yesterday and 9/1 for this.  I reviewed these records for this visit.   Yesterday she converted back to NSR after IV Cardizem drip.  She did not appear to be volume overloaded.  She was started on spironolactone to help with volume and hypokalemia.   On 9/1 she also converted to NSR from atrial fib with tachybrady rates mentioned.  She was treated with IV mag and IV potassium.  On that date her flecainde d dose was increased to 100 mg PO bid.    In the ED troponins have been negative and BNP yesterday was 60.     Note, echo on 8/29 demonstrated normal LV function with mild AS.  Of note her event monitor which she wore in August demonstrated paroxysms of A-fib.  She has sinus bradycardia with pauses of 3 seconds as well.  She had nonsustained ventricular tachycardia up to 5 beats.  Her atrial rate with fibrillation averaged at 135.  She gets quite symptomatic with this.  She gets arm pain and headache.  She feels lightheaded and panic.  She might take 60 mg of her Cardizem as instructed but when she did that last time it made her very bradycardic with heart rates into the 30s.  She does not think there is a trigger to this.   Past Medical History:  Diagnosis Date   Anxiety    Aortic atherosclerosis ([place]) 10/06/2021   Arthritis    neck   Cannabis dependence with current use ([place]) 07/26/2016   Chronic back pain    Chronic bronchitis ([facility])    Chronic diastolic CHF (congestive heart failure) (HCC)    Chronic neck pain    Cocaine abuse ([place]) 04/05/2021   COPD mixed type ([place]) 02/18/2019    Office Spirometry 10/21/2015-slight restriction of exhaled volume, mild obstruction. FVC 2.69/78%, FEV1 2.04/76%, FEV1/FVC 0.76, FEF 25-75 percent 1.68/65% PFT 04/27/2016-minimal obstructive airways disease, minimal diffusion defect, insignificant response to bronchodilator. FVC 2.79/78%, FEV1 2.11/76%, ratio 0.76, FEF 25-75 percent 1.81/72%, TLC 92%, DLCO 76%.   DDD (degenerative disc disease), cervical 06/15/2015   Diverticulosis    Eczema 11/29/2017   Encounter for long-term opiate analgesic use 07/06/2017   Essential hypertension 09/25/2015   GERD (gastroesophageal reflux disease)    takes Nexium daily   Helicobacter pylori gastritis 08/2019   Tx and eradicated   Hyperlipidemia    not on any meds bc refused to pick up from pharmacy   Long-term current use of opiate analgesic 04/05/2021   Moderate episode of recurrent major depressive disorder ([place]) 01/28/2020   Obesity (BMI 30.0-34.9) 12/16/2021   Obsessive-compulsive disorder 06/08/2007   Qualifier: Diagnosis of  By: June Leap     PAF (paroxysmal atrial fibrillation) ([place]) 12/16/2021   PANIC DISORDER 06/08/2007   Qualifier: Diagnosis of  By: June Leap     Pneumonia    2016   PONV (postoperative nausea and vomiting)    Primary osteoarthritis involving multiple joints 01/21/2015   Seborrheic dermatitis of scalp 02/11/2015  Seizures ([place])    in the 80's   Thyroid nodule 07/30/2020   IMPRESSION: No significant interval change of bilateral thyroid nodules.   The above is in keeping with the ACR TI-RADS recommendations - J Am Coll Radiol 2017;14:587-595.     Electronically Signed   By: Miachel Roux M.D.   On: 08/17/2020 14:18   Type II diabetes mellitus with manifestations ([place]) 02/18/2019   The 10-year ASCVD risk score Mikey Bussing DC Jr., et al., 2013) is: 9.1%   Values used to calculate the score:     Age: 77 years     Sex: Female     Is Non-Hispanic African American: No     Diabetic: Yes     Tobacco smoker: No     Systolic Blood  Pressure: 138 mmHg     Is BP treated: No     HDL Cholesterol: 46.2 mg/dL     Total Cholesterol: 180 mg/dL    Past Surgical History:  Procedure Laterality Date   COLONOSCOPY     ECTOPIC PREGNANCY SURGERY     x 2    ESOPHAGOGASTRODUODENOSCOPY     LAPAROSCOPIC LYSIS INTESTINAL ADHESIONS     x 2   RESECTION OF MEDIASTINAL MASS N/A 06/10/2016   Procedure: RESECTION OF MEDIASTINAL MASS;  Surgeon: Grace Isaac, MD;  Location: [facility];  Service: Thoracic;  Laterality: N/A;   VIDEO ASSISTED THORACOSCOPY Right 06/10/2016   Procedure: VIDEO ASSISTED THORACOSCOPY WITH PLACEMENT OF ON Q TUNNELER;  Surgeon: Grace Isaac, MD;  Location: [facility];  Service: Thoracic;  Laterality: Right;   VIDEO BRONCHOSCOPY N/A 06/10/2016   Procedure: VIDEO BRONCHOSCOPY;  Surgeon: Grace Isaac, MD;  Location: [facility];  Service: Thoracic;  Laterality: N/A;     Current Outpatient Medications  Medication Sig Dispense Refill   acetaminophen (TYLENOL) 500 MG tablet Take 1,000 mg by mouth every 6 (six) hours as needed for fever or headache.     albuterol (PROVENTIL) (2.5 MG/3ML) 0.083% nebulizer solution USE 1 VIAL IN NEBULIZER EVERY 6 HOURS - And As Needed 2.5 mL 11   albuterol (VENTOLIN HFA) 108 (90 Base) MCG/ACT inhaler INHALE 1 TO 2 PUFFS INTO THE LUNGS EVERY 4 HOURS AS NEEDED FOR WHEEZING OR SHORTNESS OF BREATH 8.5 each 5   ALPRAZolam (XANAX) 1 MG tablet TAKE 1 TABLET BY MOUTH EVERY 6 HOURS AS NEEDED FOR ANXIETY. 120 tablet 5   apixaban (ELIQUIS) 5 MG TABS tablet Take 1 tablet (5 mg total) by mouth 2 (two) times daily. 180 tablet 3   diltiazem (CARDIZEM CD) 120 MG 24 hr capsule Take 1 capsule (120 mg total) by mouth daily. 90 capsule 3   esomeprazole (NEXIUM) 40 MG capsule Take 40 mg by mouth daily as needed (Acid reflux).     flecainide (TAMBOCOR) 100 MG tablet Take 1 tablet (100 mg total) by mouth 2 (two) times daily. 60 tablet 11   fluocinonide (LIDEX) 0.05 % external solution Apply topically as needed. Dry  itchy scalp     furosemide (LASIX) 40 MG tablet Take 40 mg by mouth 2 (two) times daily.     Multiple Vitamin (MULTIVITAMIN) tablet Take 1 tablet by mouth daily.     rosuvastatin (CRESTOR) 5 MG tablet Take 1 tablet (5 mg total) by mouth daily. 90 tablet 3   valACYclovir (VALTREX) 1000 MG tablet Take 4,000 mg by mouth once.     Vitamin D, Ergocalciferol, (DRISDOL) 1.25 MG (50000 UNIT) CAPS capsule Take 50,000 Units by mouth  once a week. Fridays     diltiazem (CARDIZEM) 30 MG tablet Take 1 tablet (30 mg total) by mouth every 4 (four) hours as needed (elevated heart rate). 30 tablet 3   spironolactone (ALDACTONE) 25 MG tablet Take 0.5 tablets (12.5 mg total) by mouth daily. 14 tablet 0   Tiotropium Bromide Monohydrate (SPIRIVA RESPIMAT) 1.25 MCG/ACT AERS Inhale 2 puffs into the lungs daily. (Patient not taking: Reported on 04/05/2022) 4 g 5   No current facility-administered medications for this visit.    Allergies:   Sulfa antibiotics    ROS:  Please see the history of present illness.   Otherwise, review of systems are positive for none.   All other systems are reviewed and negative.    PHYSICAL EXAM: VS:  BP 130/72   Pulse 80   Ht '5\' 6"'$  (1.676 m)   Wt 211 lb (95.7 kg)   BMI 34.06 kg/m  , BMI Body mass index is 34.06 kg/m. GENERAL:  Well appearing HEENT:  Pupils equal round and reactive, fundi not visualized, oral mucosa unremarkable NECK:  No jugular venous distention, waveform within normal limits, carotid upstroke brisk and symmetric, no bruits, no thyromegaly LYMPHATICS:  No cervical, inguinal adenopathy LUNGS:  Clear to auscultation bilaterally BACK:  No CVA tenderness CHEST:  Unremarkable HEART:  PMI not displaced or sustained,S1 and S2 within normal limits, no S3, no S4, no clicks, no rubs, 2 out of 6 apical systolic murmur radiating slightly at the aortic outflow tract, no diastolic murmurs ABD:  Flat, positive bowel sounds normal in frequency in pitch, no bruits, no rebound,  no guarding, no midline pulsatile mass, no hepatomegaly, no splenomegaly EXT:  2 plus pulses throughout, mild leg edema, no cyanosis no clubbing   EKG:  EKG is not ordered today. The ekg ordered today demonstrates sinus rhythm, rate 81, left axis deviation, left anterior fascicular block, poor anterior R wave progression, no acute ST-T wave changes.  04/04/2022   Recent Labs: 08/03/2021: TSH 1.890 04/04/2022: ALT 17; B Natriuretic Peptide 59.9; BUN 17; Creatinine, Ser 0.83; Hemoglobin 14.3; Magnesium 1.9; Platelets 279; Potassium 3.4; Sodium 138    Lipid Panel    Component Value Date/Time   CHOL 206 (H) 06/14/2021 1122   TRIG 206 (H) 06/14/2021 1122   HDL 46 06/14/2021 1122   CHOLHDL 4.5 (H) 06/14/2021 1122   CHOLHDL 4 10/28/2020 0827   VLDL 30.2 10/28/2020 0827   LDLCALC 124 (H) 06/14/2021 1122   LDLDIRECT 142.0 01/21/2015 1537      Wt Readings from Last 3 Encounters:  04/05/22 211 lb (95.7 kg)  04/04/22 200 lb (90.7 kg)  04/01/22 211 lb (95.7 kg)      Other studies Reviewed: Additional studies/ records that were reviewed today include: Extensive review of multiple ED records.   (Greater than 40 minutes reviewing all data with greater than 50% face to face with the patient). Review of the above records demonstrates:  Please see elsewhere in the note.     ASSESSMENT AND PLAN:  Atrial fib with RVR: The patient is in need of ablation.  We talked about trying to get her weight down in anticipation of this.  I sent a note that she does have an appointment with Dr. Curt Bears.  In the meantime she will continue the meds as listed including the increased dose of flecainide from the other night.  She is only had to take 30 mg of her Cardizem as needed if she is having tachypalpitations.  She  will remain on her anticoagulation.  Hypokalemia: I will check a basic metabolic profile and magnesium in about a week.  She will continue her spironolactone.  Aortic atherosclerosis: She will  continue with risk reduction.  Murmur: She has some mild aortic stenosis.  No change in therapy.   Current medicines are reviewed at length with the patient today.  The patient does not have concerns regarding medicines.  The following changes have been made:  no change  Labs/ tests ordered today include:   Orders Placed This Encounter  Procedures   Basic Metabolic Panel (BMET)   Magnesium     Disposition:   FU with me after PT evaluation   Signed, Minus Breeding, MD  04/05/2022 5:15 PM    [facility]

## 2022-04-05 NOTE — Patient Instructions (Signed)
Medication Instructions:   DILTIAZEM 30 MG AS NEEDED FOR ELEVATED HEART RATE  *If you need a refill on your cardiac medications before your next appointment, please call your pharmacy*   Lab Work:  Your physician recommends that you return for lab work in: ONE WEEK-DO NOT HAVE TO FAST  If you have labs (blood work) drawn today and your tests are completely normal, you will receive your results only by: [place] (if you have MyChart) OR A paper copy in the mail If you have any lab test that is abnormal or we need to change your treatment, we will call you to review the results.   Follow-Up: At [facility], you and your health needs are our priority.  As part of our continuing mission to provide you with exceptional heart care, we have created designated Provider Care Teams.  These Care Teams include your primary Cardiologist (physician) and Advanced Practice Providers (APPs -  Physician Assistants and Nurse Practitioners) who all work together to provide you with the care you need, when you need it.  We recommend signing up for the patient portal called "MyChart".  Sign up information is provided on this After Visit Summary.  MyChart is used to connect with patients for Virtual Visits (Telemedicine).  Patients are able to view lab/test results, encounter notes, upcoming appointments, etc.  Non-urgent messages can be sent to your provider as well.   To learn more about what you can do with MyChart, go to [email or web address].    Your next appointment:   2 month(s)  The format for your next appointment:   In Person  Provider:   Minus Breeding, MD

## 2022-04-06 ENCOUNTER — Telehealth: Payer: Self-pay | Admitting: Cardiology

## 2022-04-06 NOTE — Telephone Encounter (Signed)
Pt c/o medication issue:  1. Name of Medication:   spironolactone (ALDACTONE) 25 MG tablet    2. How are you currently taking this medication (dosage and times per day)? Take 0.5 tablets (12.5 mg total) by mouth daily.  3. Are you having a reaction (difficulty breathing--STAT)? No  4. What is your medication issue? Pt states that since starting medication she had been dizzy, sweating, nausea and has to have help getting to the bathroom. Pt would like a callback as soon as possible. Please advise

## 2022-04-06 NOTE — Telephone Encounter (Signed)
Returned call to pt she states that she is feeling dizzy since starting Spironolactone. Her BP is good (110/68) Informed pt that she will get used to taking this and be careful when changing positions. Be careful when walking when she is feeling dizzy. Informed pt to keep an eye on this and continue to take her BP and call if her BP gets any lower.

## 2022-04-07 ENCOUNTER — Telehealth: Payer: Self-pay

## 2022-04-07 NOTE — Telephone Encounter (Signed)
     Patient  visit on 9/11  at [facility] was for Afib  Have you been able to follow up with your primary care physician? yes  The patient was or was not able to obtain any needed medicine or equipment.yes  Are there diet recommendations that you are having difficulty following? yes  Patient expresses understanding of discharge instructions and education provided has no other needs at this time. yes     [place], Care Management  [phone] [address], [place], [place] [address] Phone: [phone] Email: [email or web address]

## 2022-04-11 DIAGNOSIS — L820 Inflamed seborrheic keratosis: Secondary | ICD-10-CM | POA: Diagnosis not present

## 2022-04-11 DIAGNOSIS — L821 Other seborrheic keratosis: Secondary | ICD-10-CM | POA: Diagnosis not present

## 2022-04-11 DIAGNOSIS — I8311 Varicose veins of right lower extremity with inflammation: Secondary | ICD-10-CM | POA: Diagnosis not present

## 2022-04-11 DIAGNOSIS — I8312 Varicose veins of left lower extremity with inflammation: Secondary | ICD-10-CM | POA: Diagnosis not present

## 2022-04-11 DIAGNOSIS — I872 Venous insufficiency (chronic) (peripheral): Secondary | ICD-10-CM | POA: Diagnosis not present

## 2022-04-11 DIAGNOSIS — L738 Other specified follicular disorders: Secondary | ICD-10-CM | POA: Diagnosis not present

## 2022-04-12 DIAGNOSIS — I480 Paroxysmal atrial fibrillation: Secondary | ICD-10-CM | POA: Diagnosis not present

## 2022-04-12 LAB — BASIC METABOLIC PANEL
BUN/Creatinine Ratio: 17 (ref 12–28)
BUN: 13 mg/dL (ref 8–27)
CO2: 28 mmol/L (ref 20–29)
Calcium: 9.3 mg/dL (ref 8.7–10.3)
Chloride: 98 mmol/L (ref 96–106)
Creatinine, Ser: 0.78 mg/dL (ref 0.57–1.00)
Glucose: 141 mg/dL — ABNORMAL HIGH (ref 70–99)
Potassium: 4.3 mmol/L (ref 3.5–5.2)
Sodium: 138 mmol/L (ref 134–144)
eGFR: 84 mL/min/{1.73_m2} (ref >59)

## 2022-04-12 LAB — MAGNESIUM: Magnesium: 2 mg/dL (ref 1.6–2.3)

## 2022-04-13 ENCOUNTER — Telehealth: Payer: Self-pay | Admitting: Cardiology

## 2022-04-13 NOTE — Telephone Encounter (Signed)
Patient calling in to see if she can get her potassium check on tomorrow. Please advise

## 2022-04-13 NOTE — Telephone Encounter (Signed)
Spoke with patient of Dr. Percival Spanish. She reports her HR was up to 124bpm but now in the 90s. She is asking about her K+ level. Advised labs were done yesterday and K+ was WNL. Advised she can use diltiazem '30mg'$  PRN for tachycardia. She verbalized understanding.

## 2022-04-15 ENCOUNTER — Ambulatory Visit (HOSPITAL_COMMUNITY)
Admission: RE | Admit: 2022-04-15 | Discharge: 2022-04-15 | Disposition: A | Discharge status: Home / Self Care | Payer: Medicare Other | Source: Ambulatory Visit | Attending: Physician Assistant | Admitting: Physician Assistant

## 2022-04-15 ENCOUNTER — Encounter (HOSPITAL_COMMUNITY): Payer: Self-pay | Admitting: Physician Assistant

## 2022-04-15 VITALS — BP 122/92 | HR 70 | Ht 66.0 in | Wt 210.2 lb

## 2022-04-15 DIAGNOSIS — E669 Obesity, unspecified: Secondary | ICD-10-CM | POA: Insufficient documentation

## 2022-04-15 DIAGNOSIS — Z6833 Body mass index (BMI) 33.0-33.9, adult: Secondary | ICD-10-CM | POA: Insufficient documentation

## 2022-04-15 DIAGNOSIS — E785 Hyperlipidemia, unspecified: Secondary | ICD-10-CM | POA: Diagnosis not present

## 2022-04-15 DIAGNOSIS — D6869 Other thrombophilia: Secondary | ICD-10-CM | POA: Insufficient documentation

## 2022-04-15 DIAGNOSIS — Z7901 Long term (current) use of anticoagulants: Secondary | ICD-10-CM | POA: Insufficient documentation

## 2022-04-15 DIAGNOSIS — I480 Paroxysmal atrial fibrillation: Secondary | ICD-10-CM | POA: Insufficient documentation

## 2022-04-15 DIAGNOSIS — I700 Atherosclerosis of aorta: Secondary | ICD-10-CM | POA: Diagnosis not present

## 2022-04-15 DIAGNOSIS — J449 Chronic obstructive pulmonary disease, unspecified: Secondary | ICD-10-CM | POA: Insufficient documentation

## 2022-04-15 DIAGNOSIS — I10 Essential (primary) hypertension: Secondary | ICD-10-CM | POA: Diagnosis not present

## 2022-04-15 NOTE — Progress Notes (Signed)
Primary Care Physician: Roselee Nova, MD Primary Cardiologist: Dr Percival Spanish  Primary Electrophysiologist: Dr Curt Bears (new) Referring Physician: Dr Philis Pique is a 66 y.o. female with a history of aortic atherosclerosis, hypokalemia, HTN, HLD, COPD, atrial fibrillation who presents for consultation in the [facility]. Patient has had multiple ED visits for symptomatic afib with RVR. Seen at the ED 04/04/22 with afib which converted to SR with diltiazem drip. Her flecainide dose was increased. Patient is on Eliquis for a CHADS2VASC score of 4. Today, patient reports that she has had only one episode of heart racing which resolved with rest, no prolonged episodes. She has noted a headache and dizziness since increasing the flecainide but this has improved over the last 2 days. No bleeding issues on anticoagulation.   Today, she denies symptoms of palpitations, chest pain, shortness of breath, orthopnea, PND, lower extremity edema, dizziness, presyncope, syncope, snoring, daytime somnolence, bleeding, or neurologic sequela. The patient is tolerating medications without difficulties and is otherwise without complaint today.    Atrial Fibrillation Risk Factors:  she does not have symptoms or diagnosis of sleep apnea. she does not have a history of rheumatic fever.   she has a BMI of Body mass index is 33.93 kg/m.Marland Kitchen Filed Weights   04/15/22 0825  Weight: 95.3 kg    Family History  Problem Relation Age of Onset   Heart attack Father    Prostate cancer Father    Heart failure Father    Diabetes Mother    Clotting disorder Mother        PE   Heart failure Mother    Kidney disease Mother    Breast cancer Sister    Colon cancer Sister    Esophageal cancer Neg Hx    Rectal cancer Neg Hx    Stomach cancer Neg Hx      Atrial Fibrillation Management history:  Previous antiarrhythmic drugs: flecainide  Previous cardioversions:  none Previous ablations: none CHADS2VASC score: 4 Anticoagulation history: Eliquis   Past Medical History:  Diagnosis Date   Anxiety    Aortic atherosclerosis ([place]) 10/06/2021   Arthritis    neck   Cannabis dependence with current use ([place]) 07/26/2016   Chronic back pain    Chronic bronchitis ([facility])    Chronic diastolic CHF (congestive heart failure) ([place])    Chronic neck pain    Cocaine abuse ([place]) 04/05/2021   COPD mixed type ([place]) 02/18/2019   Office Spirometry 10/21/2015-slight restriction of exhaled volume, mild obstruction. FVC 2.69/78%, FEV1 2.04/76%, FEV1/FVC 0.76, FEF 25-75 percent 1.68/65% PFT 04/27/2016-minimal obstructive airways disease, minimal diffusion defect, insignificant response to bronchodilator. FVC 2.79/78%, FEV1 2.11/76%, ratio 0.76, FEF 25-75 percent 1.81/72%, TLC 92%, DLCO 76%.   DDD (degenerative disc disease), cervical 06/15/2015   Diverticulosis    Eczema 11/29/2017   Encounter for long-term opiate analgesic use 07/06/2017   Essential hypertension 09/25/2015   GERD (gastroesophageal reflux disease)    takes Nexium daily   Helicobacter pylori gastritis 08/2019   Tx and eradicated   Hyperlipidemia    not on any meds bc refused to pick up from pharmacy   Long-term current use of opiate analgesic 04/05/2021   Moderate episode of recurrent major depressive disorder ([place]) 01/28/2020   Obesity (BMI 30.0-34.9) 12/16/2021   Obsessive-compulsive disorder 06/08/2007   Qualifier: Diagnosis of  By: June Leap     PAF (paroxysmal atrial fibrillation) ([place]) 12/16/2021   PANIC DISORDER 06/08/2007  Qualifier: Diagnosis of  By: June Leap     Pneumonia    2016   PONV (postoperative nausea and vomiting)    Primary osteoarthritis involving multiple joints 01/21/2015   Seborrheic dermatitis of scalp 02/11/2015   Seizures ([place])    in the 80's   Thyroid nodule 07/30/2020   IMPRESSION: No significant interval change of bilateral thyroid nodules.   The above  is in keeping with the ACR TI-RADS recommendations - J Am Coll Radiol 2017;14:587-595.     Electronically Signed   By: Miachel Roux M.D.   On: 08/17/2020 14:18   Type II diabetes mellitus with manifestations ([place]) 02/18/2019   The 10-year ASCVD risk score Mikey Bussing DC Jr., et al., 2013) is: 9.1%   Values used to calculate the score:     Age: 50 years     Sex: Female     Is Non-Hispanic African American: No     Diabetic: Yes     Tobacco smoker: No     Systolic Blood Pressure: 371 mmHg     Is BP treated: No     HDL Cholesterol: 46.2 mg/dL     Total Cholesterol: 180 mg/dL   Past Surgical History:  Procedure Laterality Date   COLONOSCOPY     ECTOPIC PREGNANCY SURGERY     x 2    ESOPHAGOGASTRODUODENOSCOPY     LAPAROSCOPIC LYSIS INTESTINAL ADHESIONS     x 2   RESECTION OF MEDIASTINAL MASS N/A 06/10/2016   Procedure: RESECTION OF MEDIASTINAL MASS;  Surgeon: Grace Isaac, MD;  Location: [facility];  Service: Thoracic;  Laterality: N/A;   VIDEO ASSISTED THORACOSCOPY Right 06/10/2016   Procedure: VIDEO ASSISTED THORACOSCOPY WITH PLACEMENT OF ON Q TUNNELER;  Surgeon: Grace Isaac, MD;  Location: [facility];  Service: Thoracic;  Laterality: Right;   VIDEO BRONCHOSCOPY N/A 06/10/2016   Procedure: VIDEO BRONCHOSCOPY;  Surgeon: Grace Isaac, MD;  Location: [facility];  Service: Thoracic;  Laterality: N/A;    Current Outpatient Medications  Medication Sig Dispense Refill   acetaminophen (TYLENOL) 500 MG tablet Take 1,000 mg by mouth every 6 (six) hours as needed for fever or headache.     albuterol (PROVENTIL) (2.5 MG/3ML) 0.083% nebulizer solution USE 1 VIAL IN NEBULIZER EVERY 6 HOURS - And As Needed 2.5 mL 11   albuterol (VENTOLIN HFA) 108 (90 Base) MCG/ACT inhaler INHALE 1 TO 2 PUFFS INTO THE LUNGS EVERY 4 HOURS AS NEEDED FOR WHEEZING OR SHORTNESS OF BREATH 8.5 each 5   ALPRAZolam (XANAX) 1 MG tablet TAKE 1 TABLET BY MOUTH EVERY 6 HOURS AS NEEDED FOR ANXIETY. 120 tablet 5   apixaban (ELIQUIS) 5 MG TABS  tablet Take 1 tablet (5 mg total) by mouth 2 (two) times daily. 180 tablet 3   clindamycin (CLEOCIN T) 1 % external solution SMARTSIG:1 sparingly Topical Daily     diltiazem (CARDIZEM CD) 120 MG 24 hr capsule Take 1 capsule (120 mg total) by mouth daily. 90 capsule 3   diltiazem (CARDIZEM) 30 MG tablet Take 1 tablet (30 mg total) by mouth every 4 (four) hours as needed (elevated heart rate). 30 tablet 3   esomeprazole (NEXIUM) 40 MG capsule Take 40 mg by mouth daily as needed (Acid reflux).     flecainide (TAMBOCOR) 100 MG tablet Take 1 tablet (100 mg total) by mouth 2 (two) times daily. 60 tablet 11   fluocinonide (LIDEX) 0.05 % external solution Apply topically as needed. Dry itchy scalp  furosemide (LASIX) 40 MG tablet Take 40 mg by mouth 2 (two) times daily.     Multiple Vitamin (MULTIVITAMIN) tablet Take 1 tablet by mouth daily.     rosuvastatin (CRESTOR) 5 MG tablet Take 1 tablet (5 mg total) by mouth daily. 90 tablet 3   spironolactone (ALDACTONE) 25 MG tablet Take 0.5 tablets (12.5 mg total) by mouth daily. 14 tablet 0   Tiotropium Bromide Monohydrate (SPIRIVA RESPIMAT) 1.25 MCG/ACT AERS Inhale 2 puffs into the lungs daily. 4 g 5   triamcinolone cream (KENALOG) 0.1 % Apply topically 2 (two) times daily.     valACYclovir (VALTREX) 1000 MG tablet Take 4,000 mg by mouth once.     Vitamin D, Ergocalciferol, (DRISDOL) 1.25 MG (50000 UNIT) CAPS capsule Take 50,000 Units by mouth once a week. Fridays     No current facility-administered medications for this encounter.    Allergies  Allergen Reactions   Sulfa Antibiotics Hives    Social History   Socioeconomic History   Marital status: Divorced    Spouse name: Not on file   Number of children: 1   Years of education: Not on file   Highest education level: Not on file  Occupational History   Occupation: retired  Tobacco Use   Smoking status: Former    Packs/day: 1.00    Years: 36.00    Total pack years: 36.00    Types:  Cigarettes    Quit date: 2017    Years since quitting: 6.7   Smokeless tobacco: Never   Tobacco comments:    Former smoker last cigarette 2017 04/15/22  Vaping Use   Vaping Use: Never used  Substance and Sexual Activity   Alcohol use: No    Alcohol/week: 0.0 standard drinks of alcohol   Drug use: No   Sexual activity: Not on file  Other Topics Concern   Not on file  Social History Narrative   Retired [employer]   Divorced   1 son   Former smoker   2 coffee/daty   No EtOH, no drugs   Social Determinants of Radio broadcast assistant Strain: Not on file  Food Insecurity: Not on file  Transportation Needs: Not on file  Physical Activity: Not on file  Stress: Not on file  Social Connections: Not on file  Intimate Partner Violence: Not on file     ROS- All systems are reviewed and negative except as per the HPI above.  Physical Exam: Vitals:   04/15/22 0825  BP: (!) 122/92  Pulse: 70  Weight: 95.3 kg  Height: '5\' 6"'$  (1.676 m)    GEN- The patient is a well appearing female, alert and oriented x 3 today.   Head- normocephalic, atraumatic Eyes-  Sclera clear, conjunctiva pink Ears- hearing intact Oropharynx- clear Neck- supple  Lungs- Clear to ausculation bilaterally, normal work of breathing Heart- Regular rate and rhythm, no murmurs, rubs or gallops  GI- soft, NT, ND, + BS Extremities- no clubbing, cyanosis, or edema MS- no significant deformity or atrophy Skin- no rash or lesion Psych- euthymic mood, full affect Neuro- strength and sensation are intact  Wt Readings from Last 3 Encounters:  04/15/22 95.3 kg  04/05/22 95.7 kg  04/04/22 90.7 kg    EKG today demonstrates  SR Vent. rate 70 BPM PR interval 158 ms QRS duration 108 ms QT/QTcB 434/468 ms  Echo 03/22/22 demonstrated  1. Left ventricular ejection fraction, by estimation, is 60 to 65%. The  left ventricle has  normal function. The left ventricle has no regional  wall motion  abnormalities. Left ventricular diastolic parameters are  consistent with Grade I diastolic dysfunction (impaired relaxation).   2. Left atrial size was mildly dilated.   3. Aortic valve regurgitation is mild. Mild aortic valve stenosis.   Epic records are reviewed at length today  CHA2DS2-VASc Score = 4  The patient's score is based upon: CHF History: 0 HTN History: 1 Diabetes History: 0 Stroke History: 0 Vascular Disease History: 1 (aortic atherosclerosis) Age Score: 1 Gender Score: 1       ASSESSMENT AND PLAN: 1. Paroxysmal Atrial Fibrillation (ICD10:  I48.0) The patient's CHA2DS2-VASc score is 4, indicating a 4.8% annual risk of stroke.   Patient in [place] today.  Continue flecainide 100 mg BID. We discussed decreasing to 75 mg BID to try to minimize side effects. Given her recent improvement, will continue 100 mg for now.  She has an appointment with Dr Curt Bears to discuss ablation.  Continue diltiazem 120 mg daily with 30 mg PRN q 4 hours for heart racing.  Continue Eliquis 5 mg BID  2. Secondary Hypercoagulable State (ICD10:  D68.69) The patient is at significant risk for stroke/thromboembolism based upon her CHA2DS2-VASc Score of 4.  Continue Apixaban (Eliquis).   3. Obesity Body mass index is 33.93 kg/m. Lifestyle modification was discussed at length including regular exercise and weight reduction.  4. HTN Stable, no changes today.  5. Aortic atherosclerosis  On statin   Follow up with Dr Curt Bears as scheduled.    [facility] [address] [place], [place] [address] [phone] 04/15/2022 9:04 AM

## 2022-04-18 ENCOUNTER — Encounter: Payer: Self-pay | Admitting: *Deleted

## 2022-04-22 NOTE — Progress Notes (Unsigned)
Patient ID: Felicia Odom, female    DOB: 02/19/1956, 66 y.o.   MRN: [id number]  HPI F former smoker followed for COPD, Allergic rhinitis, complicated by Thymoma/ resected, anxiety Office spirometry- 01/29/15 Normal spirometry. FVC 2.74/81%, FEV1 2.11/79%, FEV1/FVC 0.77, FEF 25-75 percent 1.79 Office Spirometry 10/21/2015-slight restriction of exhaled volume, mild obstruction. FVC 2.69/78%, FEV1 2.04/76%, FEV1/FVC 0.76, FEF 25-75 percent 1.68/65% PFT 04/27/2016-minimal obstructive airways disease, minimal diffusion defect, insignificant response to bronchodilator. FVC 2.79/78%, FEV1 2.11/76%, ratio 0.76, FEF 25-75 percent 1.81/72%, TLC 92%, DLCO 76%. .----------------------------------------------------------------------------------------------------   10/20/21- 66 year old female former smoker (36 pk yrs) followed for Asthma/Bronchitis/ COPD,, allergic Rhinitis, complicated by Resection Thymoma/ TSGY, OCD/panic, Anxiety/ Depression, GERD, DM2, HTN, Eczema, Osteoarthritis, Hyperlipidemia, AFib/ Eliquis, dCHF, Aortic Atherosclerosis, -Neb albuterol , Ventolin hfa, Xanax 1 mg,  Covid vax- 4 Phizer Flu vax-had Hosp 3/3- AFib,  PFT 10/21/21-mid obstruction, no resp to BD, airtrapping, normal DLCO Using rescue inhaler 1-2x/ day. Quit Symbicort because thought it might bother heart. Taking Xanax 1-4x/ day for anxiety- discussed.  CT chest low dose screen 08/26/21 IMPRESSION: 1. Lung-RADS 1, negative. Continue annual screening with low-dose chest CT without contrast in 12 months. 2. Mitral valve calcifications. 3. Aortic Atherosclerosis (ICD10-I70.0) and Emphysema (ICD10-J43.9).  04/25/22-  66 year old female former smoker (36 pk yrs) followed for Asthma/Bronchitis/ COPD,, allergic Rhinitis, complicated by Resection Thymoma/ TSGY, OCD/panic, Anxiety/ Depression, GERD, DM2, HTN, Eczema, Osteoarthritis, Hyperlipidemia, AFib/ Eliquis, dCHF, Aortic Atherosclerosis, -Neb albuterol , Ventolin hfa,  Xanax 1 mg,  Covid vax- 4 Phizer Flu vax-had 3 ED visits in last 2 months for AFib  CXR 04/04/22 1V IMPRESSION: Bibasilar pulmonary opacities. Differential considerations include atelectasis, aspiration, or infection.   Review of Systems- see HPI + = positive Constitutional:   No-   weight loss, +night sweats, fevers, chills, fatigue, lassitude. HEENT:   No-  headaches, difficulty swallowing, tooth/dental problems, sore throat,       No-  sneezing, itching, ear ache, nasal congestion, post nasal drip,  CV:  + chest pain, No-orthopnea, PND, swelling in lower extremities, anasarca, dizziness, palpitations Resp: + shortness of breath with exertion or at rest.              productive cough,   non-productive cough,  No- coughing up of blood.            change in color of mucus.  No- wheezing.   Skin: No-   rash or lesions. GI:    heartburn, indigestion, No-abdominal pain, nausea, vomiting,  GU:  MS:  + joint pain or swelling.  . Neuro-     nothing unusual Psych: change in mood or affect. +Chronic depression , + anxiety.  No memory loss.   Objective:   Physical Exam General- Alert, Oriented , Distress- none acute, + Obese. + easily tearful, Skin- rash-none, lesions- none, excoriation- none Lymphadenopathy- none Head- + healed scar forehead            Eyes- Gross vision intact, PERRLA, conjunctivae clear secretions            Ears- Hearing, canals-normal            Nose- No- rhinorrhea, no-Septal dev, polyps, erosion, perforation             Throat- Mallampati II , mucosa clear , drainage- none, tonsils- atrophic. +Missing teeth Neck- flexible , trachea midline, no stridor , thyroid nl, carotid no bruit Chest - symmetrical excursion , unlabored  Heart/CV- RRR , no murmur , no gallop, no rub, nl s1 s2                           - JVD- none , edema- none, stasis changes- none, varices- none           Lung- wheeze-none, unlabored, cough-none, dullness-none, rub- none,             Chest wall-  +R VATS scars s/p thymectomy. No rub. Abd-  Br/ Gen/ Rectal- Not done, not indicated Extrem-  Neuro- grossly intact to observation

## 2022-04-25 ENCOUNTER — Ambulatory Visit (INDEPENDENT_AMBULATORY_CARE_PROVIDER_SITE_OTHER): Payer: Medicare Other

## 2022-04-25 ENCOUNTER — Encounter: Payer: Self-pay | Admitting: Internal Medicine

## 2022-04-25 ENCOUNTER — Ambulatory Visit (INDEPENDENT_AMBULATORY_CARE_PROVIDER_SITE_OTHER): Payer: Medicare Other | Admitting: Internal Medicine

## 2022-04-25 VITALS — BP 124/72 | HR 72 | Temp 98.0°F | Ht 66.0 in | Wt 208.4 lb

## 2022-04-25 DIAGNOSIS — J449 Chronic obstructive pulmonary disease, unspecified: Secondary | ICD-10-CM

## 2022-04-25 DIAGNOSIS — I4891 Unspecified atrial fibrillation: Secondary | ICD-10-CM | POA: Diagnosis not present

## 2022-04-25 DIAGNOSIS — R911 Solitary pulmonary nodule: Secondary | ICD-10-CM

## 2022-04-25 MED ORDER — AZITHROMYCIN 250 MG PO TABS: ORAL_TABLET | ORAL | 0 refills | Status: DC

## 2022-04-25 NOTE — Assessment & Plan Note (Signed)
Pending f/u low dose screening CT in Feb, 24. Only nonspecific basilar densities noted on 1-V CXR in September.

## 2022-04-25 NOTE — Assessment & Plan Note (Signed)
Pending cardiology/ electrophysiology visits

## 2022-04-25 NOTE — Patient Instructions (Signed)
Script sent for Zpak antibiotic for bronchitis  Order- CXR   dx COPD exacerbation  I think your cardiology and primary care visits will make you feel better

## 2022-04-25 NOTE — Assessment & Plan Note (Signed)
Mild bronchitic exacerbation Plan- CXR, Zpak

## 2022-04-29 ENCOUNTER — Institutional Professional Consult (permissible substitution): Payer: Medicare Other | Admitting: Cardiology

## 2022-05-02 ENCOUNTER — Other Ambulatory Visit: Payer: Self-pay | Admitting: Cardiology

## 2022-05-02 DIAGNOSIS — I480 Paroxysmal atrial fibrillation: Secondary | ICD-10-CM

## 2022-05-02 MED ORDER — DILTIAZEM HCL 30 MG PO TABS: 30.0000 mg | ORAL_TABLET | ORAL | 3 refills | Status: DC | PRN

## 2022-05-02 MED ORDER — SPIRONOLACTONE 25 MG PO TABS: 12.5000 mg | ORAL_TABLET | Freq: Every day | ORAL | 0 refills | Status: DC

## 2022-05-02 MED ORDER — DILTIAZEM HCL ER COATED BEADS 120 MG PO CP24: 120.0000 mg | ORAL_CAPSULE | Freq: Every day | ORAL | 3 refills | Status: DC

## 2022-05-02 MED ORDER — VITAMIN D (ERGOCALCIFEROL) 1.25 MG (50000 UNIT) PO CAPS: 50000.0000 [IU] | ORAL_CAPSULE | ORAL | 3 refills | Status: DC

## 2022-05-02 MED ORDER — ROSUVASTATIN CALCIUM 5 MG PO TABS: 5.0000 mg | ORAL_TABLET | Freq: Every day | ORAL | 3 refills | Status: DC

## 2022-05-02 MED ORDER — FUROSEMIDE 40 MG PO TABS: 40.0000 mg | ORAL_TABLET | Freq: Two times a day (BID) | ORAL | 3 refills | Status: DC

## 2022-05-02 NOTE — Telephone Encounter (Signed)
*  STAT* If patient is at the pharmacy, call can be transferred to refill team.   1. Which medications need to be refilled? (please list name of each medication and dose if known)  diltiazem (CARDIZEM CD) 120 MG 24 hr capsule diltiazem (CARDIZEM) 30 MG tablet apixaban (ELIQUIS) 5 MG TABS tablet furosemide (LASIX) 40 MG tablet Vitamin D, Ergocalciferol, (DRISDOL) 1.25 MG (50000 UNIT) CAPS capsule rosuvastatin (CRESTOR) 5 MG tablet spironolactone (ALDACTONE) 25 MG tablet  2. Which pharmacy/location (including street and city if local pharmacy) is medication to be sent to? [id number] [place] [address]. Do they need a 30 day or 90 day supply?  90 day supply

## 2022-05-06 ENCOUNTER — Ambulatory Visit: Payer: Medicare Other | Attending: Cardiology | Admitting: Cardiology

## 2022-05-06 ENCOUNTER — Encounter: Payer: Self-pay | Admitting: Cardiology

## 2022-05-06 VITALS — BP 122/72 | HR 71 | Ht 66.0 in | Wt 209.0 lb

## 2022-05-06 DIAGNOSIS — I480 Paroxysmal atrial fibrillation: Secondary | ICD-10-CM

## 2022-05-06 DIAGNOSIS — D6869 Other thrombophilia: Secondary | ICD-10-CM

## 2022-05-06 MED ORDER — AMIODARONE HCL 200 MG PO TABS: ORAL_TABLET | ORAL | 11 refills | Status: DC

## 2022-05-06 NOTE — Patient Instructions (Addendum)
Medication Instructions:  Your physician has recommended you make the following change in your medication:   STOP: flecainide START: amiodarone 200 mg tablet: Take 2 tablets (400 mg total) twice a day for 14 days, then take 1 tablet (200 mg total) by mouth once a day  *If you need a refill on your cardiac medications before your next appointment, please call your pharmacy*   Lab Work: None  If you have labs (blood work) drawn today and your tests are completely normal, you will receive your results only by: [place] (if you have MyChart) OR A paper copy in the mail If you have any lab test that is abnormal or we need to change your treatment, we will call you to review the results.   Testing/Procedures: Your physician has requested that you have cardiac CT. Cardiac computed tomography (CT) is a painless test that uses an x-ray machine to take clear, detailed pictures of your heart. For further information please visit [email or web address]. Please follow instruction sheet as given.   Your physician has recommended that you have an ablation. Catheter ablation is a medical procedure used to treat some cardiac arrhythmias (irregular heartbeats). During catheter ablation, a long, thin, flexible tube is put into a blood vessel in your groin (upper thigh), or neck. This tube is called an ablation catheter. It is then guided to your heart through the blood vessel. Radio frequency waves destroy small areas of heart tissue where abnormal heartbeats may cause an arrhythmia to start. Please see the instruction sheet given to you today.   Follow-Up: At [facility], you and your health needs are our priority.  As part of our continuing mission to provide you with exceptional heart care, we have created designated Provider Care Teams.  These Care Teams include your primary Cardiologist (physician) and Advanced Practice Providers (APPs -  Physician Assistants and Nurse Practitioners) who all  work together to provide you with the care you need, when you need it.  We recommend signing up for the patient portal called "MyChart".  Sign up information is provided on this After Visit Summary.  MyChart is used to connect with patients for Virtual Visits (Telemedicine).  Patients are able to view lab/test results, encounter notes, upcoming appointments, etc.  Non-urgent messages can be sent to your provider as well.   To learn more about what you can do with MyChart, go to [email or web address].    Your next appointment:   Ablation date is Feb 15. Sherri, Dr. Ileana Ladd nurse will call you.   Important Information About Sugar

## 2022-05-06 NOTE — Progress Notes (Signed)
Electrophysiology Office Note   Date:  05/06/2022   ID:  Felicia, Odom Jan 17, 1956, MRN [id number]  PCP:  Felicia Nova, MD  Cardiologist:  Felicia Odom Primary Electrophysiologist:  Felicia Brass Meredith Leeds, MD    Chief Complaint: AF   History of Present Illness: Felicia Odom is a 66 y.o. female who is being seen today for the evaluation of AF at the request of Felicia Odom, Felicia Cliche, MD. Presenting today for electrophysiology evaluation.  She has a history significant for aortic atherosclerosis, hypokalemia, Hypertension, hyperlipidemia, COPD, atrial fibrillation.  She has had multiple ER visits for atrial fibrillation with rapid rates.  She is currently on flecainide.  Recent ER visit was not on 04/04/2022.  She was started on diltiazem and converted to sinus rhythm.  Her flecainide was increased at the time.  I did increase dose of flecainide, she is continued to have episodes of atrial fibrillation.  She feels quite poorly in atrial fibrillation with weakness, fatigue, shortness of breath.  She also gets quite a bit of anxiety when she is in A-fib.  Today, she denies symptoms of palpitations, chest pain, shortness of breath, orthopnea, PND, lower extremity edema, claudication, dizziness, presyncope, syncope, bleeding, or neurologic sequela. The patient is tolerating medications without difficulties.    Past Medical History:  Diagnosis Date   Anxiety    Aortic atherosclerosis ([place]) 10/06/2021   Arthritis    neck   Cannabis dependence with current use ([place]) 07/26/2016   Chronic back pain    Chronic bronchitis (HCC)    Chronic diastolic CHF (congestive heart failure) (HCC)    Chronic neck pain    Cocaine abuse ([place]) 04/05/2021   COPD mixed type ([place]) 02/18/2019   Office Spirometry 10/21/2015-slight restriction of exhaled volume, mild obstruction. FVC 2.69/78%, FEV1 2.04/76%, FEV1/FVC 0.76, FEF 25-75 percent 1.68/65% PFT 04/27/2016-minimal obstructive airways disease,  minimal diffusion defect, insignificant response to bronchodilator. FVC 2.79/78%, FEV1 2.11/76%, ratio 0.76, FEF 25-75 percent 1.81/72%, TLC 92%, DLCO 76%.   DDD (degenerative disc disease), cervical 06/15/2015   Diverticulosis    Eczema 11/29/2017   Encounter for long-term opiate analgesic use 07/06/2017   Essential hypertension 09/25/2015   GERD (gastroesophageal reflux disease)    takes Nexium daily   Helicobacter pylori gastritis 08/2019   Tx and eradicated   Hyperlipidemia    not on any meds bc refused to pick up from pharmacy   Long-term current use of opiate analgesic 04/05/2021   Moderate episode of recurrent major depressive disorder ([place]) 01/28/2020   Obesity (BMI 30.0-34.9) 12/16/2021   Obsessive-compulsive disorder 06/08/2007   Qualifier: Diagnosis of  By: June Leap     PAF (paroxysmal atrial fibrillation) ([place]) 12/16/2021   PANIC DISORDER 06/08/2007   Qualifier: Diagnosis of  By: June Leap     Pneumonia    2016   PONV (postoperative nausea and vomiting)    Primary osteoarthritis involving multiple joints 01/21/2015   Seborrheic dermatitis of scalp 02/11/2015   Seizures ([place])    in the 80's   Thyroid nodule 07/30/2020   IMPRESSION: No significant interval change of bilateral thyroid nodules.   The above is in keeping with the ACR TI-RADS recommendations - J Am Coll Radiol 2017;14:587-595.     Electronically Signed   By: Miachel Roux M.D.   On: 08/17/2020 14:18   Type II diabetes mellitus with manifestations (Felicia Odom) 02/18/2019   The 10-year ASCVD risk score Mikey Bussing DC Jr., et al., 2013) is: 9.1%   Values used  to calculate the score:     Age: 33 years     Sex: Female     Is Non-Hispanic African American: No     Diabetic: Yes     Tobacco smoker: No     Systolic Blood Pressure: 235 mmHg     Is BP treated: No     HDL Cholesterol: 46.2 mg/dL     Total Cholesterol: 180 mg/dL   Past Surgical History:  Procedure Laterality Date   COLONOSCOPY     ECTOPIC PREGNANCY SURGERY      x 2    ESOPHAGOGASTRODUODENOSCOPY     LAPAROSCOPIC LYSIS INTESTINAL ADHESIONS     x 2   RESECTION OF MEDIASTINAL MASS N/A 06/10/2016   Procedure: RESECTION OF MEDIASTINAL MASS;  Surgeon: Grace Isaac, MD;  Location: [facility];  Service: Thoracic;  Laterality: N/A;   VIDEO ASSISTED THORACOSCOPY Right 06/10/2016   Procedure: VIDEO ASSISTED THORACOSCOPY WITH PLACEMENT OF ON Q TUNNELER;  Surgeon: Grace Isaac, MD;  Location: [facility];  Service: Thoracic;  Laterality: Right;   VIDEO BRONCHOSCOPY N/A 06/10/2016   Procedure: VIDEO BRONCHOSCOPY;  Surgeon: Grace Isaac, MD;  [facility];  Service: Thoracic;  Laterality: N/A;     Current Outpatient Medications  Medication Sig Dispense Refill   acetaminophen (TYLENOL) 500 MG tablet Take 1,000 mg by mouth every 6 (six) hours as needed for fever or headache.     albuterol (PROVENTIL) (2.5 MG/3ML) 0.083% nebulizer solution USE 1 VIAL IN NEBULIZER EVERY 6 HOURS - And As Needed 2.5 mL 11   albuterol (VENTOLIN HFA) 108 (90 Base) MCG/ACT inhaler INHALE 1 TO 2 PUFFS INTO THE LUNGS EVERY 4 HOURS AS NEEDED FOR WHEEZING OR SHORTNESS OF BREATH 8.5 each 5   ALPRAZolam (XANAX) 1 MG tablet TAKE 1 TABLET BY MOUTH EVERY 6 HOURS AS NEEDED FOR ANXIETY. 120 tablet 5   amiodarone (PACERONE) 200 MG tablet Take 2 tablets (400 mg total) by mouth twice a day for 2 weeks, then take 1 tablet (200 mg total) by mouth once a day 72 tablet 11   apixaban (ELIQUIS) 5 MG TABS tablet Take 1 tablet (5 mg total) by mouth 2 (two) times daily. 180 tablet 3   clindamycin (CLEOCIN T) 1 % external solution SMARTSIG:1 sparingly Topical Daily     diltiazem (CARDIZEM CD) 120 MG 24 hr capsule Take 1 capsule (120 mg total) by mouth daily. 90 capsule 3   diltiazem (CARDIZEM) 30 MG tablet Take 1 tablet (30 mg total) by mouth every 4 (four) hours as needed (elevated heart rate). 30 tablet 3   esomeprazole (NEXIUM) 40 MG capsule Take 40 mg by mouth daily as needed (Acid reflux).      furosemide (LASIX) 40 MG tablet Take 1 tablet (40 mg total) by mouth 2 (two) times daily. 30 tablet 3   Multiple Vitamin (MULTIVITAMIN) tablet Take 1 tablet by mouth daily.     nystatin cream (MYCOSTATIN) Apply 1 Application topically 2 (two) times daily.     rosuvastatin (CRESTOR) 5 MG tablet Take 1 tablet (5 mg total) by mouth daily. 90 tablet 3   spironolactone (ALDACTONE) 25 MG tablet Take 0.5 tablets (12.5 mg total) by mouth daily. 14 tablet 0   triamcinolone ointment (KENALOG) 0.1 % SMARTSIG:sparingly Topical Twice Daily     valACYclovir (VALTREX) 1000 MG tablet Take 4,000 mg by mouth once.     Vitamin D, Ergocalciferol, (DRISDOL) 1.25 MG (50000 UNIT) CAPS capsule Take 1 capsule (50,000 Units  total) by mouth once a week. Fridays 5 capsule 3   No current facility-administered medications for this visit.    Allergies:   Sulfa antibiotics   Social History:  The patient  reports that she quit smoking about 6 years ago. Her smoking use included cigarettes. She has a 36.00 pack-year smoking history. She has never used smokeless tobacco. She reports that she does not drink alcohol and does not use drugs.   Family History:  The patient's family history includes Breast cancer in her sister; Clotting disorder in her mother; Colon cancer in her sister; Diabetes in her mother; Heart attack in her father; Heart failure in her father and mother; Kidney disease in her mother; Prostate cancer in her father.    ROS:  Please see the history of present illness.   Otherwise, review of systems is positive for none.   All other systems are reviewed and negative.    PHYSICAL EXAM: VS:  BP 122/72   Pulse 71   Ht '5\' 6"'$  (1.676 m)   Wt 209 lb (94.8 kg)   SpO2 96%   BMI 33.73 kg/m  , BMI Body mass index is 33.73 kg/m. GEN: Well nourished, well developed, in no acute distress  HEENT: normal  Neck: no JVD, carotid bruits, or masses Cardiac: RRR; systolic murmur at the base respiratory:  clear to  auscultation bilaterally, normal work of breathing GI: soft, nontender, nondistended, + BS MS: no deformity or atrophy  Skin: warm and dry Neuro:  Strength and sensation are intact Psych: euthymic mood, full affect  EKG:  EKG is not ordered today. Personal review of the ekg ordered 04/15/22 shows sinus rhythm  Recent Labs: 08/03/2021: TSH 1.890 04/04/2022: ALT 17; B Natriuretic Peptide 59.9; Hemoglobin 14.3; Platelets 279 04/12/2022: BUN 13; Creatinine, Ser 0.78; Magnesium 2.0; Potassium 4.3; Sodium 138    Lipid Panel     Component Value Date/Time   CHOL 206 (H) 06/14/2021 1122   TRIG 206 (H) 06/14/2021 1122   HDL 46 06/14/2021 1122   CHOLHDL 4.5 (H) 06/14/2021 1122   CHOLHDL 4 10/28/2020 0827   VLDL 30.2 10/28/2020 0827   LDLCALC 124 (H) 06/14/2021 1122   LDLDIRECT 142.0 01/21/2015 1537     Wt Readings from Last 3 Encounters:  05/06/22 209 lb (94.8 kg)  04/25/22 208 lb 6.4 oz (94.5 kg)  04/15/22 210 lb 3.2 oz (95.3 kg)      Other studies Reviewed: Additional studies/ records that were reviewed today include: TTE 8/29/*23  Review of the above records today demonstrates:   1. Left ventricular ejection fraction, by estimation, is 60 to 65%. The  left ventricle has normal function. The left ventricle has no regional  wall motion abnormalities. Left ventricular diastolic parameters are  consistent with Grade I diastolic  dysfunction (impaired relaxation).   2. Left atrial size was mildly dilated.   3. Aortic valve regurgitation is mild. Mild aortic valve stenosis.   Monitor 03/02/2022 personally reviewed Atrial Fibrillation/Flutter 1 event lasting 5 hours and 24 minutes occurred (3% burden), ranging from 87-178 bpm (avg of 135 bpm), the longest lasting 5 hours 24 mins with an avg rate of 135 bpm. Heart rate during atrial fibrillation exceeded 110 bpm 87% of the time.   Atrial Fibrillation/Flutter was detected within +/- 45 seconds of symptomatic patient event(s). Isolated  SVEs were rare (<1.0%), SVE Couplets were rare (<1.0%), and SVE Triplets were rare (<1.0%).   ASSESSMENT AND PLAN:  1.  Paroxysmal atrial fibrillation: CHA2DS2-VASc of  4.  Currently on flecainide 100 mg twice daily, Eliquis 5 mg twice daily.  Has had emergency room visits for atrial fibrillation.  She would prefer an alternative rhythm control strategy.  She would like to avoid further medication management.  Due to that, we Yissel Habermehl plan for ablation.  Risk and benefits of been discussed.  She understands these risks and is agreed to the procedure.  He is continued to have episodes of atrial fibrillation despite her flecainide, we Jahlen Bollman stop this medication.  We Omaira Mellen load on amiodarone.  Risk, benefits, and alternatives to EP study and radiofrequency ablation for afib were also discussed in detail today. These risks include but are not limited to stroke, bleeding, vascular damage, tamponade, perforation, damage to the esophagus, lungs, and other structures, pulmonary vein stenosis, worsening renal function, and death. The patient understands these risk and wishes to proceed.  We Nishant Schrecengost therefore proceed with catheter ablation at the next available time.  Carto, ICE, anesthesia are requested for the procedure.  Diamante Truszkowski also obtain CT PV protocol prior to the procedure to exclude LAA thrombus and further evaluate atrial anatomy.   2.  Obesity: Lifestyle modification encouraged Body mass index is 33.73 kg/m.  3.  Hypertension: Currently well controlled  4.  Second hypercoagulable state: Currently on Eliquis for atrial fibrillation as above    Current medicines are reviewed at length with the patient today.   The patient does not have concerns regarding her medicines.  The following changes were made today:  none  Labs/ tests ordered today include:  Orders Placed This Encounter  Procedures   CT CARDIAC MORPH/PULM VEIN W/CM&W/O CA SCORE   CBC w/Diff   Basic Metabolic Panel (BMET)     Disposition:    FU with Kelise Kuch 3 months  Signed, Chayanne Filippi Meredith Leeds, MD  05/06/2022 11:14 AM     [facility] [address] [place] [address] [phone] (office) [phone] (fax)

## 2022-05-20 ENCOUNTER — Other Ambulatory Visit: Payer: Self-pay | Admitting: Family Medicine

## 2022-05-20 DIAGNOSIS — H53129 Transient visual loss, unspecified eye: Secondary | ICD-10-CM

## 2022-05-25 ENCOUNTER — Other Ambulatory Visit: Payer: Self-pay | Admitting: Cardiology

## 2022-05-25 ENCOUNTER — Ambulatory Visit
Admission: RE | Admit: 2022-05-25 | Discharge: 2022-05-25 | Disposition: A | Discharge status: Home / Self Care | Payer: Medicare Other | Source: Ambulatory Visit | Attending: Family Medicine | Admitting: Family Medicine

## 2022-05-25 DIAGNOSIS — I480 Paroxysmal atrial fibrillation: Secondary | ICD-10-CM

## 2022-05-25 DIAGNOSIS — H53129 Transient visual loss, unspecified eye: Secondary | ICD-10-CM

## 2022-05-26 ENCOUNTER — Encounter: Payer: Self-pay | Admitting: Family Medicine

## 2022-05-31 ENCOUNTER — Other Ambulatory Visit: Payer: Self-pay | Admitting: Internal Medicine

## 2022-06-01 ENCOUNTER — Institutional Professional Consult (permissible substitution): Payer: Medicare Other | Admitting: Cardiology

## 2022-06-01 ENCOUNTER — Other Ambulatory Visit: Payer: Self-pay | Admitting: Internal Medicine

## 2022-06-01 NOTE — Telephone Encounter (Signed)
Albuterol inhaler refilled 

## 2022-06-04 DIAGNOSIS — E876 Hypokalemia: Secondary | ICD-10-CM | POA: Insufficient documentation

## 2022-06-04 NOTE — Progress Notes (Unsigned)
Cardiology Office Note   Date:  06/06/2022   ID:  Felicia, Odom 05-10-1956, MRN [id number]  PCP:  Roselee Nova, MD  Cardiologist:   Minus Breeding, MD   Chief Complaint  Patient presents with   Atrial Fibrillation      History of Present Illness: Felicia Odom is a 66 y.o. female who presents for evaluation of atrial fib.    She was in the ED and 9/1 for this.  She was in atrial fib. She converted back to NSR after IV Cardizem drip.  She did not appear to be volume overloaded.  She was started on spironolactone to help with volume and hypokalemia.   Echo on 8/29 demonstrated normal LV function with mild AS.  Of note her event monitor which she wore in August demonstrated paroxysms of A-fib.  She has sinus bradycardia with pauses of 3 seconds as well.  She had nonsustained ventricular tachycardia up to 5 beats.  Her atrial rate with fibrillation averaged at 135.   She saw Dr. Curt Bears and now ablation is planned.   He started on amiodarone load.  She is doing okay since I saw her.  The ablation is scheduled for February.  She has not really felt much in the way fibrillation.  She denies any chest pressure, neck or arm discomfort.  Has had less swelling than she had before.  She has not had much lightheadedness.    Past Medical History:  Diagnosis Date   Anxiety    Aortic atherosclerosis ([place]) 10/06/2021   Arthritis    neck   Cannabis dependence with current use ([place]) 07/26/2016   Chronic back pain    Chronic bronchitis (HCC)    Chronic diastolic CHF (congestive heart failure) (HCC)    Chronic neck pain    Cocaine abuse ([place]) 04/05/2021   COPD mixed type ([place]) 02/18/2019   Office Spirometry 10/21/2015-slight restriction of exhaled volume, mild obstruction. FVC 2.69/78%, FEV1 2.04/76%, FEV1/FVC 0.76, FEF 25-75 percent 1.68/65% PFT 04/27/2016-minimal obstructive airways disease, minimal diffusion defect, insignificant response to bronchodilator. FVC 2.79/78%,  FEV1 2.11/76%, ratio 0.76, FEF 25-75 percent 1.81/72%, TLC 92%, DLCO 76%.   DDD (degenerative disc disease), cervical 06/15/2015   Diverticulosis    Eczema 11/29/2017   Encounter for long-term opiate analgesic use 07/06/2017   Essential hypertension 09/25/2015   GERD (gastroesophageal reflux disease)    takes Nexium daily   Helicobacter pylori gastritis 08/2019   Tx and eradicated   Hyperlipidemia    not on any meds bc refused to pick up from pharmacy   Long-term current use of opiate analgesic 04/05/2021   Moderate episode of recurrent major depressive disorder ([place]) 01/28/2020   Obesity (BMI 30.0-34.9) 12/16/2021   Obsessive-compulsive disorder 06/08/2007   Qualifier: Diagnosis of  By: June Leap     PAF (paroxysmal atrial fibrillation) ([place]) 12/16/2021   PANIC DISORDER 06/08/2007   Qualifier: Diagnosis of  By: June Leap     Pneumonia    2016   PONV (postoperative nausea and vomiting)    Primary osteoarthritis involving multiple joints 01/21/2015   Seborrheic dermatitis of scalp 02/11/2015   Seizures ([place])    in the 80's   Thyroid nodule 07/30/2020   IMPRESSION: No significant interval change of bilateral thyroid nodules.   The above is in keeping with the ACR TI-RADS recommendations - J Am Coll Radiol 2017;14:587-595.     Electronically Signed   By: Miachel Roux M.D.   On: 08/17/2020  14:18   Type II diabetes mellitus with manifestations ([place]) 02/18/2019   The 10-year ASCVD risk score Mikey Bussing DC Jr., et al., 2013) is: 9.1%   Values used to calculate the score:     Age: 7 years     Sex: Female     Is Non-Hispanic African American: No     Diabetic: Yes     Tobacco smoker: No     Systolic Blood Pressure: 536 mmHg     Is BP treated: No     HDL Cholesterol: 46.2 mg/dL     Total Cholesterol: 180 mg/dL    Past Surgical History:  Procedure Laterality Date   COLONOSCOPY     ECTOPIC PREGNANCY SURGERY     x 2    ESOPHAGOGASTRODUODENOSCOPY     LAPAROSCOPIC LYSIS INTESTINAL  ADHESIONS     x 2   RESECTION OF MEDIASTINAL MASS N/A 06/10/2016   Procedure: RESECTION OF MEDIASTINAL MASS;  Surgeon: Grace Isaac, MD;  Location: [place];  Service: Thoracic;  Laterality: N/A;   VIDEO ASSISTED THORACOSCOPY Right 06/10/2016   Procedure: VIDEO ASSISTED THORACOSCOPY WITH PLACEMENT OF ON Q TUNNELER;  Surgeon: Grace Isaac, MD;  Location: [place];  Service: Thoracic;  Laterality: Right;   VIDEO BRONCHOSCOPY N/A 06/10/2016   Procedure: VIDEO BRONCHOSCOPY;  Surgeon: Grace Isaac, MD;  [facility];  Service: Thoracic;  Laterality: N/A;     Current Outpatient Medications  Medication Sig Dispense Refill   acetaminophen (TYLENOL) 500 MG tablet Take 1,000 mg by mouth every 6 (six) hours as needed for fever or headache.     albuterol (PROVENTIL) (2.5 MG/3ML) 0.083% nebulizer solution USE 1 VIAL IN NEBULIZER EVERY 6 HOURS - And As Needed 2.5 mL 11   albuterol (VENTOLIN HFA) 108 (90 Base) MCG/ACT inhaler INHALE 1 TO 2 PUFFS INTO THE LUNGS EVERY 4 HOURS AS NEEDED FOR WHEEZING OR SHORTNESS OF BREATH 18 each 12   ALPRAZolam (XANAX) 1 MG tablet TAKE 1 TABLET BY MOUTH EVERY 6 HOURS AS NEEDED FOR ANXIETY. 120 tablet 5   clindamycin (CLEOCIN T) 1 % external solution SMARTSIG:1 sparingly Topical Daily     esomeprazole (NEXIUM) 40 MG capsule Take 40 mg by mouth daily as needed (Acid reflux).     Multiple Vitamin (MULTIVITAMIN) tablet Take 1 tablet by mouth daily.     nystatin cream (MYCOSTATIN) Apply 1 Application topically 2 (two) times daily.     triamcinolone ointment (KENALOG) 0.1 % SMARTSIG:sparingly Topical Twice Daily     valACYclovir (VALTREX) 1000 MG tablet Take 4,000 mg by mouth once.     Vitamin D, Ergocalciferol, (DRISDOL) 1.25 MG (50000 UNIT) CAPS capsule Take 1 capsule (50,000 Units total) by mouth once a week. Fridays 5 capsule 3   amiodarone (PACERONE) 200 MG tablet Take 1 tablet (200 mg total) by mouth daily. Take 2 tablets (400 mg total) by mouth twice a day for 2  weeks, then take 1 tablet (200 mg total) by mouth once a day 90 tablet 3   apixaban (ELIQUIS) 5 MG TABS tablet Take 1 tablet (5 mg total) by mouth 2 (two) times daily. 180 tablet 3   diltiazem (CARDIZEM CD) 120 MG 24 hr capsule Take 1 capsule (120 mg total) by mouth daily. 90 capsule 3   diltiazem (CARDIZEM) 30 MG tablet Take 1 tablet (30 mg total) by mouth every 4 (four) hours as needed (elevated heart rate). 30 tablet 3   furosemide (LASIX) 40 MG tablet Take 1 tablet (  40 mg total) by mouth 2 (two) times daily. 180 tablet 3   rosuvastatin (CRESTOR) 5 MG tablet Take 1 tablet (5 mg total) by mouth daily. 90 tablet 3   spironolactone (ALDACTONE) 25 MG tablet Take 0.5 tablets (12.5 mg total) by mouth daily. 45 tablet 3   No current facility-administered medications for this visit.    Allergies:   Sulfa antibiotics    ROS:  Please see the history of present illness.   Otherwise, review of systems are positive for none.   All other systems are reviewed and negative.    PHYSICAL EXAM: VS:  BP 136/72   Pulse 64   Ht '5\' 6"'$  (1.676 m)   Wt 213 lb 9.6 oz (96.9 kg)   SpO2 98%   BMI 34.48 kg/m  , BMI Body mass index is 34.48 kg/m. GENERAL:  Well appearing NECK:  No jugular venous distention, waveform within normal limits, carotid upstroke brisk and symmetric, no bruits, no thyromegaly LUNGS:  Clear to auscultation bilaterally CHEST:  Unremarkable HEART:  PMI not displaced or sustained,S1 and S2 within normal limits, no S3, no S4, no clicks, no rubs, no murmurs ABD:  Flat, positive bowel sounds normal in frequency in pitch, no bruits, no rebound, no guarding, no midline pulsatile mass, no hepatomegaly, no splenomegaly EXT:  2 plus pulses throughout, no edema, no cyanosis no clubbing    EKG:  EKG is not ordered today.    Recent Labs: 08/03/2021: TSH 1.890 04/04/2022: ALT 17; B Natriuretic Peptide 59.9; Hemoglobin 14.3; Platelets 279 04/12/2022: BUN 13; Creatinine, Ser 0.78; Magnesium 2.0;  Potassium 4.3; Sodium 138    Lipid Panel    Component Value Date/Time   CHOL 206 (H) 06/14/2021 1122   TRIG 206 (H) 06/14/2021 1122   HDL 46 06/14/2021 1122   CHOLHDL 4.5 (H) 06/14/2021 1122   CHOLHDL 4 10/28/2020 0827   VLDL 30.2 10/28/2020 0827   LDLCALC 124 (H) 06/14/2021 1122   LDLDIRECT 142.0 01/21/2015 1537      Wt Readings from Last 3 Encounters:  06/06/22 213 lb 9.6 oz (96.9 kg)  05/06/22 209 lb (94.8 kg)  04/25/22 208 lb 6.4 oz (94.5 kg)      Other studies Reviewed: Additional studies/ records that were reviewed today include:  None Review of the above records demonstrates:  NA   ASSESSMENT AND PLAN:  Atrial fib with RVR:    She is due to have ablation.  She is going to continue her amiodarone.  No change in therapy.   Of note she has a CT PV protocol and CT lung scanning screen and I sent a message to both providers to see if they can consolidate the studies.  The 3 days apart in February.  She tolerates anticoagulation.  No change in therapy.    Hypokalemia:   She is up-to-date with blood work and has had normal magnesium and potassium at the last check.  Hypercoaguable state: She tolerates anticoagulation has had no bleeding.  Aortic atherosclerosis:   She will continue with risk reduction.  Murmur: She has some mild aortic stenosis.  No change in therapy.  No further imaging.   Current medicines are reviewed at length with the patient today.  The patient does not have concerns regarding medicines.  The following changes have been made:   None  Labs/ tests ordered today include: None  No orders of the defined types were placed in this encounter.    Disposition:   FU with me after  after EP procedure.   Signed, Minus Breeding, MD  06/06/2022 8:41 AM    [facility]

## 2022-06-06 ENCOUNTER — Encounter: Payer: Self-pay | Admitting: Cardiology

## 2022-06-06 ENCOUNTER — Ambulatory Visit: Payer: Medicare Other | Attending: Cardiology | Admitting: Cardiology

## 2022-06-06 VITALS — BP 136/72 | HR 64 | Ht 66.0 in | Wt 213.6 lb

## 2022-06-06 DIAGNOSIS — E876 Hypokalemia: Secondary | ICD-10-CM | POA: Diagnosis not present

## 2022-06-06 DIAGNOSIS — I350 Nonrheumatic aortic (valve) stenosis: Secondary | ICD-10-CM | POA: Diagnosis not present

## 2022-06-06 DIAGNOSIS — I480 Paroxysmal atrial fibrillation: Secondary | ICD-10-CM | POA: Diagnosis not present

## 2022-06-06 DIAGNOSIS — D6869 Other thrombophilia: Secondary | ICD-10-CM | POA: Diagnosis not present

## 2022-06-06 MED ORDER — DILTIAZEM HCL ER COATED BEADS 120 MG PO CP24: 120.0000 mg | ORAL_CAPSULE | Freq: Every day | ORAL | 3 refills | Status: DC

## 2022-06-06 MED ORDER — APIXABAN 5 MG PO TABS: 5.0000 mg | ORAL_TABLET | Freq: Two times a day (BID) | ORAL | 3 refills | Status: DC

## 2022-06-06 MED ORDER — FUROSEMIDE 40 MG PO TABS: 40.0000 mg | ORAL_TABLET | Freq: Two times a day (BID) | ORAL | 3 refills | Status: DC

## 2022-06-06 MED ORDER — ROSUVASTATIN CALCIUM 5 MG PO TABS: 5.0000 mg | ORAL_TABLET | Freq: Every day | ORAL | 3 refills | Status: DC

## 2022-06-06 MED ORDER — DILTIAZEM HCL 30 MG PO TABS: 30.0000 mg | ORAL_TABLET | ORAL | 3 refills | Status: DC | PRN

## 2022-06-06 MED ORDER — AMIODARONE HCL 200 MG PO TABS: 200.0000 mg | ORAL_TABLET | Freq: Every day | ORAL | 3 refills | Status: DC

## 2022-06-06 MED ORDER — SPIRONOLACTONE 25 MG PO TABS: 12.5000 mg | ORAL_TABLET | Freq: Every day | ORAL | 3 refills | Status: DC

## 2022-06-06 NOTE — Patient Instructions (Signed)
Medication Instructions:  Your physician recommends that you continue on your current medications as directed. Please refer to the Current Medication list given to you today.  *If you need a refill on your cardiac medications before your next appointment, please call your pharmacy*  Lab Work: NONE ordered at this time of appointment   If you have labs (blood work) drawn today and your tests are completely normal, you will receive your results only by: [place] (if you have MyChart) OR A paper copy in the mail If you have any lab test that is abnormal or we need to change your treatment, we will call you to review the results.  Testing/Procedures: NONE ordered at this time of appointment   Follow-Up: At [facility], you and your health needs are our priority.  As part of our continuing mission to provide you with exceptional heart care, we have created designated Provider Care Teams.  These Care Teams include your primary Cardiologist (physician) and Advanced Practice Providers (APPs -  Physician Assistants and Nurse Practitioners) who all work together to provide you with the care you need, when you need it.    Your next appointment:   4 month(s)  The format for your next appointment:   In Person  Provider:   Minus Breeding, MD     Other Instructions  Important Information About Sugar

## 2022-06-09 ENCOUNTER — Telehealth: Payer: Self-pay

## 2022-06-09 NOTE — Telephone Encounter (Signed)
Patient called to ask if she had a 'combo scan' ordered to combine the LDCT and the CT ordered by cardiology, prior to an upcoming ablation.  Appts show two Feb appts for CT.  Patient states she had tried to call pulmonary office but was on hold for 29 minutes.  Her cardiologist told her he was going to call Dr. Annamaria Boots about combining the two scans.  Advised no notes in chart about changing these but patient just saw cardiologist this week so she says he probably has not had time to call.  She will follow up by end of 2023 if she has not been updated regarding CT appts, since she has until Feb 2024 to get these done.  Patient just wanted to know if appts had been changed yet.  Advised we are not aware of any changes at this time.

## 2022-06-13 ENCOUNTER — Telehealth: Payer: Self-pay | Admitting: Internal Medicine

## 2022-06-13 MED ORDER — ACETAMINOPHEN-CODEINE 300-15 MG PO TABS: ORAL_TABLET | ORAL | 0 refills | Status: DC

## 2022-06-13 MED ORDER — PROMETHAZINE-CODEINE 6.25-10 MG/5ML PO SYRP: 5.0000 mL | ORAL_SOLUTION | Freq: Four times a day (QID) | ORAL | 0 refills | Status: DC | PRN

## 2022-06-13 MED ORDER — AZITHROMYCIN 250 MG PO TABS: ORAL_TABLET | ORAL | 0 refills | Status: DC

## 2022-06-13 NOTE — Telephone Encounter (Signed)
I have sent script for Tylenol # 2 (with codeine) to her [facility] She can use this for cough

## 2022-06-13 NOTE — Telephone Encounter (Signed)
ZPAK and prometh codeine scripts were set to phone in, changing to Normal

## 2022-06-13 NOTE — Telephone Encounter (Signed)
I sent scripts for Zpak and prometh codeine to [facility]

## 2022-06-13 NOTE — Telephone Encounter (Signed)
Called Pt to inform that meds are at pharmacy. Nothing further needed.

## 2022-06-13 NOTE — Telephone Encounter (Signed)
Returned Pt's call to confirm that she needed the cough med sent to [facility] on [address] because [facility] not [facility]. I told Pt that I would call her back once sent.

## 2022-06-13 NOTE — Telephone Encounter (Signed)
Called and let Pt know that Dr Annamaria Boots sent her a Rx for Tylenol #2 with codeine to [facility] [address]. Pt stated understanding and nothing further needed.

## 2022-06-13 NOTE — Telephone Encounter (Signed)
Called and spoke with pt letting her know that [facility] sent in an alternative medication to the pharmacy for her and she verbalized understanding. Nothing further needed.

## 2022-06-13 NOTE — Telephone Encounter (Signed)
Pt states she has had a productive cough and sore throat x4 days. Pt states she took 3 Covid tests all negative. Pt states she has tried OTC Tussin without any relief. Pt is requesting Zpak and Promethazine with Codeine.

## 2022-06-14 ENCOUNTER — Ambulatory Visit: Payer: Medicare Other | Admitting: Cardiology

## 2022-06-14 MED ORDER — ACETAMINOPHEN-CODEINE 300-15 MG PO TABS: ORAL_TABLET | ORAL | 0 refills | Status: DC

## 2022-06-14 NOTE — Telephone Encounter (Addendum)
I called and spoke with the pt  She states [facility]2 in stock  She wants this to be sent to the [facility] on [address]  She states that she may need something in addition to this and the zpack, bc her throat is more sore today  She looked with her flashlight and says her throat is red and has white spots, painful to swallow  Dr Annamaria Boots- please re send rx to the [facility] [place] (I have pended this and cancelled the rx at [facility]) and advise on sore throat  Note that pt denies fevers, aches and has already tested neg for covid x 3  Allergies  Allergen Reactions   Sulfa Antibiotics Hives

## 2022-06-14 NOTE — Telephone Encounter (Signed)
I called and spoke with the pt and notified of response per Dr Annamaria Boots She verbalized understanding  Nothing further needed

## 2022-06-14 NOTE — Telephone Encounter (Signed)
Patient is calling again because she stated she just spoke with [facility] and they do not have the rx for medication.  Please advise and call patient to update at [phone]

## 2022-06-14 NOTE — Telephone Encounter (Signed)
Tylenol # 2 sent to her [facility] on [address]  The Zpak antibiotic will give good coverage for strep throat. If it is viral, no antibiotic will help. Ok to use throat lozenges, salt water gargles, etc.

## 2022-06-14 NOTE — Addendum Note (Signed)
Addended by: Baird Lyons D on: 06/14/2022 11:33 AM   Modules accepted: Orders

## 2022-06-14 NOTE — Addendum Note (Signed)
Addended by: Rosana Berger on: 06/14/2022 09:27 AM   Modules accepted: Orders

## 2022-06-17 ENCOUNTER — Telehealth: Payer: Self-pay | Admitting: Internal Medicine

## 2022-06-17 NOTE — Telephone Encounter (Signed)
Called and spoke with pt letting her know the info per Dr. Verlee Monte and he verbalized understanding. Nothing further needed.

## 2022-06-17 NOTE — Telephone Encounter (Signed)
Spoke with pt who states she tested positive for Covid on home test around the 06/15/22. Pt states dry cough is the only symptom she is having. Pt states she will finish taking Z-Pak today and is currently taking Tylenol with codeine ordered by Dr. Annamaria Boots.  Pt denies fever/ chills/ GI upset. Pt has been advised to quarantine until she hears from [place]. Pt is also wondering how long she should quarantine  and when she should retest again. Dr. Verlee Monte please advise as Dr. Annamaria Boots is not available.

## 2022-06-17 NOTE — Telephone Encounter (Signed)
She has had symptoms too long for antivirals to be effective - paxlovid, molnupiravir are not indicated for her. She is already past day 5 of symptoms and does not need to isolate per latest CDC guidelines. She should mask up around other people till day 10 of symptoms (around 11/26 or so).

## 2022-06-20 ENCOUNTER — Telehealth: Payer: Self-pay | Admitting: Internal Medicine

## 2022-06-20 ENCOUNTER — Telehealth: Payer: Self-pay | Admitting: Cardiology

## 2022-06-20 NOTE — Telephone Encounter (Signed)
Patient states during 11/13 appointment with Dr. Percival Spanish she was advised that he would call Dr. Annamaria Boots to inquire about combining 09/01/22 CT and lung scan for cancer. She states is would be more convenient for her to only go to 1 location. Patient would like to know if we have any updates from Dr. Annamaria Boots.

## 2022-06-20 NOTE — Telephone Encounter (Signed)
Patient called to follow up on CT ordered by Dr. Percival Spanish and CT ordered by Dr. Annamaria Boots can be done together or is she has to get them done separately? Patient stated this was brought up by Dr. Percival Spanish during her 11/13 visit and she wants to follow up. Patient informed will send message to Dr. Percival Spanish to see if he and Dr. Annamaria Boots have spoken, and if there are updates related to her CT orders. Patient agrees with plan, no additional questions at this time.

## 2022-06-20 NOTE — Telephone Encounter (Signed)
Pt calling wanting to know if Dr. Curt Bears wants to see her before her ablation on 09/08/21

## 2022-06-21 ENCOUNTER — Other Ambulatory Visit: Payer: Self-pay | Admitting: Cardiology

## 2022-06-21 ENCOUNTER — Telehealth: Payer: Self-pay | Admitting: Internal Medicine

## 2022-06-21 ENCOUNTER — Other Ambulatory Visit: Payer: Self-pay | Admitting: *Deleted

## 2022-06-21 ENCOUNTER — Other Ambulatory Visit: Payer: Self-pay | Admitting: Internal Medicine

## 2022-06-21 DIAGNOSIS — Z122 Encounter for screening for malignant neoplasm of respiratory organs: Secondary | ICD-10-CM

## 2022-06-21 DIAGNOSIS — J4520 Mild intermittent asthma, uncomplicated: Secondary | ICD-10-CM

## 2022-06-21 DIAGNOSIS — I480 Paroxysmal atrial fibrillation: Secondary | ICD-10-CM

## 2022-06-21 DIAGNOSIS — Z87891 Personal history of nicotine dependence: Secondary | ICD-10-CM

## 2022-06-21 NOTE — Telephone Encounter (Signed)
Rx for pt's albuterol solution has been sent to [facility] for pt. Called and spoke with pt letting her know this had been done and she verbalized understanding. Nothing further needed.

## 2022-06-21 NOTE — Telephone Encounter (Signed)
Moved LDCT to 09/01/22.  Called pt to confirm. She states she didn't want appt moved.  Advised I received message to move the appt to same date as cardiac study.  Pt states she did call but believes it might be better to leave it on the other day.  Advised pt that I was unable to reach her to confirm so I had moved due to what I thought was her request to move the pt.  Pt states yes she had called but it was her Dr. Who wanted it moved and she now thinks might be best to leave them separate.  Advised pt that the LDCT only takes about 5-10 minutes and it would save her the inconvenience of driving for the CT another date but I would change it back if the appt was still available.  Patient states she has so many appointments she gets confused.  She agreed to leave both appts on 09/01/22.  CT at [facility] will work her in for CT as soon as they can but she is scheduled for two separate appts for 930 and 1130am on Feb 8.  Patient acknowledged understanding.

## 2022-06-22 ENCOUNTER — Telehealth: Payer: Self-pay | Admitting: Cardiology

## 2022-06-22 NOTE — Telephone Encounter (Signed)
Pt notified OK to take COVID medication. Verbalized understanding.

## 2022-06-22 NOTE — Telephone Encounter (Signed)
Please see other encounter- duplicate

## 2022-06-22 NOTE — Telephone Encounter (Signed)
Pt calling to inquire about medication that was prescribed to her today due to her testing positive for Covid. She was told by PCP to check with Dr. Percival Spanish to make sure she is able to take Lagevrio 200 MG. Please advise

## 2022-06-22 NOTE — Telephone Encounter (Signed)
Ok to take [place]

## 2022-06-22 NOTE — Telephone Encounter (Signed)
Pt c/o medication issue:  1. Name of Medication: Lageverio 200 mg  2. How are you currently taking this medication (dosage and times per day)? Not taking yet  3. Are you having a reaction (difficulty breathing--STAT)?   4. What is your medication issue? Patient would like a call back to make sure this medication is okay to take. She states she have covid and this is what was prescribed to her.

## 2022-06-22 NOTE — Telephone Encounter (Signed)
Minus Breeding, MD  Waylan Rocher, LPN2 minutes ago (3:07 PM)    Yes chart reviewed and no contraindication.    Returned call to patient and made her aware okay to take [place]. Patient sill start today. Patient audibly short of breath and wheezing on the phone. Patient states she has covid and an infection. Advised patient that if breathing worsens or any other symptoms develop she should report to the ER for evaluation. Patient aware and verbalized understanding.

## 2022-06-22 NOTE — Telephone Encounter (Signed)
Returned pt call. Pt is not feeling well and is Covid positive (she sounds terrible on the phone). Advised pt to call me back when feeling better and we can discuss her call in question. Patient verbalized understanding and agreeable to plan.

## 2022-06-27 NOTE — Telephone Encounter (Signed)
Patient made aware and reviewed instructions of testing. No additional questions at this time.

## 2022-06-30 ENCOUNTER — Other Ambulatory Visit: Payer: Self-pay | Admitting: Family Medicine

## 2022-06-30 DIAGNOSIS — E041 Nontoxic single thyroid nodule: Secondary | ICD-10-CM

## 2022-07-13 ENCOUNTER — Other Ambulatory Visit: Payer: Medicare Other

## 2022-07-13 ENCOUNTER — Ambulatory Visit
Admission: RE | Admit: 2022-07-13 | Discharge: 2022-07-13 | Disposition: A | Discharge status: Home / Self Care | Payer: Medicare Other | Source: Ambulatory Visit | Attending: Family Medicine | Admitting: Family Medicine

## 2022-07-13 DIAGNOSIS — E041 Nontoxic single thyroid nodule: Secondary | ICD-10-CM

## 2022-07-19 ENCOUNTER — Telehealth: Payer: Self-pay | Admitting: Cardiology

## 2022-07-19 ENCOUNTER — Other Ambulatory Visit: Payer: Self-pay | Admitting: Internal Medicine

## 2022-07-19 DIAGNOSIS — I480 Paroxysmal atrial fibrillation: Secondary | ICD-10-CM

## 2022-07-19 MED ORDER — AMIODARONE HCL 200 MG PO TABS: 200.0000 mg | ORAL_TABLET | Freq: Every day | ORAL | 3 refills | Status: DC

## 2022-07-19 MED ORDER — DILTIAZEM HCL 30 MG PO TABS: ORAL_TABLET | ORAL | 3 refills | Status: DC

## 2022-07-19 NOTE — Telephone Encounter (Signed)
Tylenol with codeine refilled

## 2022-07-19 NOTE — Telephone Encounter (Signed)
*  STAT* If patient is at the pharmacy, call can be transferred to refill team.   1. Which medications need to be refilled? (please list name of each medication and dose if known)  new prescriptions for Diltiazem 120 mg,  Amiodarone - changing pharmacy  2. Which pharmacy/location (including street and city if local pharmacy) is medication to be sent to? [place], [place]   3. Do they need a 30 day or 90 day supply? 90 days and refills

## 2022-07-20 ENCOUNTER — Telehealth: Payer: Self-pay | Admitting: Cardiology

## 2022-07-20 MED ORDER — DILTIAZEM HCL ER COATED BEADS 120 MG PO CP24: 120.0000 mg | ORAL_CAPSULE | Freq: Every day | ORAL | 3 refills | Status: DC

## 2022-07-20 NOTE — Telephone Encounter (Signed)
 *  STAT* If patient is at the pharmacy, call can be transferred to refill team.   1. Which medications need to be refilled? (please list name of each medication and dose if known) diltiazem (CARDIZEM CD) 120 MG 24 hr capsule   2. Which pharmacy/location (including street and city if local pharmacy) is medication to be sent to? [place] ([place]), [place] [address]    3. Do they need a 30 day or 90 day supply? 90 days  Pt has new pharmacy and needs refill today

## 2022-07-24 ENCOUNTER — Other Ambulatory Visit: Payer: Self-pay | Admitting: Internal Medicine

## 2022-07-26 NOTE — Telephone Encounter (Signed)
Please advise on refill request   Allergies  Allergen Reactions   Sulfa Antibiotics Hives     Current Outpatient Medications:    acetaminophen (TYLENOL) 500 MG tablet, Take 1,000 mg by mouth every 6 (six) hours as needed for fever or headache., Disp: , Rfl:    acetaminophen-codeine (TYLENOL #2) 300-15 MG tablet, TAKE 1 TO 2 TABLETS BY MOUTH EVERY 6 HOURS AS NEEDED, Disp: 30 tablet, Rfl: 0   albuterol (PROVENTIL) (2.5 MG/3ML) 0.083% nebulizer solution, USE 1 VIAL IN NEBULIZER EVERY 6 HOURS - And As Needed, Disp: 120 mL, Rfl: 11   albuterol (VENTOLIN HFA) 108 (90 Base) MCG/ACT inhaler, INHALE 1 TO 2 PUFFS INTO THE LUNGS EVERY 4 HOURS AS NEEDED FOR WHEEZING OR SHORTNESS OF BREATH, Disp: 18 each, Rfl: 12   ALPRAZolam (XANAX) 1 MG tablet, TAKE 1 TABLET BY MOUTH EVERY 6 HOURS AS NEEDED FOR ANXIETY., Disp: 120 tablet, Rfl: 5   amiodarone (PACERONE) 200 MG tablet, Take 1 tablet (200 mg total) by mouth daily. Take 2 tablets (400 mg total) by mouth twice a day for 2 weeks, then take 1 tablet (200 mg total) by mouth once a day, Disp: 90 tablet, Rfl: 3   apixaban (ELIQUIS) 5 MG TABS tablet, Take 1 tablet (5 mg total) by mouth 2 (two) times daily., Disp: 180 tablet, Rfl: 3   azithromycin (ZITHROMAX) 250 MG tablet, 2 today then one daily, Disp: 6 tablet, Rfl: 0   clindamycin (CLEOCIN T) 1 % external solution, SMARTSIG:1 sparingly Topical Daily, Disp: , Rfl:    diltiazem (CARDIZEM CD) 120 MG 24 hr capsule, Take 1 capsule (120 mg total) by mouth daily., Disp: 90 capsule, Rfl: 3   diltiazem (CARDIZEM) 30 MG tablet, TAKE 1 TABLET BY MOUTH EVERY 4 HOURS AS NEEDED (ELEVATED HEART RATE), Disp: 90 tablet, Rfl: 3   esomeprazole (NEXIUM) 40 MG capsule, Take 40 mg by mouth daily as needed (Acid reflux)., Disp: , Rfl:    furosemide (LASIX) 40 MG tablet, Take 1 tablet (40 mg total) by mouth 2 (two) times daily., Disp: 180 tablet, Rfl: 3   Multiple Vitamin (MULTIVITAMIN) tablet, Take 1 tablet by mouth daily., Disp: ,  Rfl:    nystatin cream (MYCOSTATIN), Apply 1 Application topically 2 (two) times daily., Disp: , Rfl:    rosuvastatin (CRESTOR) 5 MG tablet, Take 1 tablet (5 mg total) by mouth daily., Disp: 90 tablet, Rfl: 3   spironolactone (ALDACTONE) 25 MG tablet, Take 0.5 tablets (12.5 mg total) by mouth daily., Disp: 45 tablet, Rfl: 3   triamcinolone ointment (KENALOG) 0.1 %, SMARTSIG:sparingly Topical Twice Daily, Disp: , Rfl:    valACYclovir (VALTREX) 1000 MG tablet, Take 4,000 mg by mouth once., Disp: , Rfl:    Vitamin D, Ergocalciferol, (DRISDOL) 1.25 MG (50000 UNIT) CAPS capsule, Take 1 capsule (50,000 Units total) by mouth once a week. Fridays, Disp: 5 capsule, Rfl: 3

## 2022-07-26 NOTE — Telephone Encounter (Signed)
Alprazolam refilled.  

## 2022-07-29 ENCOUNTER — Other Ambulatory Visit: Payer: Self-pay | Admitting: Cardiology

## 2022-08-16 ENCOUNTER — Telehealth: Payer: Self-pay | Admitting: Cardiology

## 2022-08-16 NOTE — Telephone Encounter (Signed)
  Pt is calling,she has some questions about her upcoming ablation and would like to discuss it with the nurse

## 2022-08-16 NOTE — Telephone Encounter (Signed)
I spoke with pt. She had not received her Instruction letters for her CT or Procedure.   I have completed her letters and she is aware of how to find them on her MyChart. I also mailed her a copy.

## 2022-08-18 ENCOUNTER — Other Ambulatory Visit: Payer: Self-pay | Admitting: Cardiology

## 2022-08-19 NOTE — Addendum Note (Signed)
Addended by: Stanton Kidney on: 08/19/2022 08:09 AM   Modules accepted: Orders

## 2022-08-25 ENCOUNTER — Ambulatory Visit: Payer: 59 | Attending: Cardiology

## 2022-08-25 DIAGNOSIS — I480 Paroxysmal atrial fibrillation: Secondary | ICD-10-CM

## 2022-08-25 DIAGNOSIS — D6869 Other thrombophilia: Secondary | ICD-10-CM

## 2022-08-25 LAB — CBC WITH DIFFERENTIAL/PLATELET

## 2022-08-26 LAB — BASIC METABOLIC PANEL
BUN/Creatinine Ratio: 19 (ref 12–28)
BUN: 18 mg/dL (ref 8–27)
CO2: 28 mmol/L (ref 20–29)
Calcium: 9 mg/dL (ref 8.7–10.3)
Chloride: 101 mmol/L (ref 96–106)
Creatinine, Ser: 0.95 mg/dL (ref 0.57–1.00)
Glucose: 101 mg/dL — ABNORMAL HIGH (ref 70–99)
Potassium: 4 mmol/L (ref 3.5–5.2)
Sodium: 143 mmol/L (ref 134–144)
eGFR: 66 mL/min/{1.73_m2} (ref >59)

## 2022-08-26 LAB — CBC WITH DIFFERENTIAL/PLATELET
Basophils Absolute: 0.1 10*3/uL (ref 0.0–0.2)
Basos: 2 %
EOS (ABSOLUTE): 0.2 10*3/uL (ref 0.0–0.4)
Eos: 3 %
Hematocrit: 43.5 % (ref 34.0–46.6)
Hemoglobin: 14.4 g/dL (ref 11.1–15.9)
Immature Grans (Abs): 0 10*3/uL (ref 0.0–0.1)
Immature Granulocytes: 0 %
Lymphocytes Absolute: 2 10*3/uL (ref 0.7–3.1)
Lymphs: 34 %
MCH: 28.2 pg (ref 26.6–33.0)
MCHC: 33.1 g/dL (ref 31.5–35.7)
MCV: 85 fL (ref 79–97)
Monocytes Absolute: 0.6 10*3/uL (ref 0.1–0.9)
Monocytes: 10 %
Neutrophils Absolute: 3.1 10*3/uL (ref 1.4–7.0)
Neutrophils: 51 %
Platelets: 269 10*3/uL (ref 150–450)
RBC: 5.1 x10E6/uL (ref 3.77–5.28)
RDW: 13.7 % (ref 11.7–15.4)
WBC: 5.9 10*3/uL (ref 3.4–10.8)

## 2022-08-29 ENCOUNTER — Other Ambulatory Visit: Payer: Medicare Other

## 2022-08-31 ENCOUNTER — Other Ambulatory Visit: Payer: Self-pay

## 2022-08-31 ENCOUNTER — Telehealth (HOSPITAL_COMMUNITY): Payer: Self-pay | Admitting: Emergency Medicine

## 2022-08-31 MED ORDER — VITAMIN D (ERGOCALCIFEROL) 1.25 MG (50000 UNIT) PO CAPS: 50000.0000 [IU] | ORAL_CAPSULE | ORAL | 2 refills | Status: DC

## 2022-08-31 NOTE — Telephone Encounter (Signed)
Reaching out to patient to offer assistance regarding upcoming cardiac imaging study; pt verbalizes understanding of appt date/time, parking situation and where to check in, pre-test NPO status and medications ordered, and verified current allergies; name and call back number provided for further questions should they arise Marchia Bond RN Navigator Cardiac Imaging [facility] [phone] office [phone] cell  Arrival 915 [address] Daily meds except diuretics Denies iv issues Aware contrast

## 2022-09-01 ENCOUNTER — Ambulatory Visit (HOSPITAL_BASED_OUTPATIENT_CLINIC_OR_DEPARTMENT_OTHER)
Admission: RE | Admit: 2022-09-01 | Discharge: 2022-09-01 | Disposition: A | Discharge status: Home / Self Care | Payer: 59 | Source: Ambulatory Visit | Attending: Acute Care | Admitting: Acute Care

## 2022-09-01 ENCOUNTER — Ambulatory Visit (HOSPITAL_BASED_OUTPATIENT_CLINIC_OR_DEPARTMENT_OTHER): Payer: Medicare Other

## 2022-09-01 ENCOUNTER — Other Ambulatory Visit: Payer: Self-pay

## 2022-09-01 ENCOUNTER — Ambulatory Visit (HOSPITAL_BASED_OUTPATIENT_CLINIC_OR_DEPARTMENT_OTHER)
Admission: RE | Admit: 2022-09-01 | Discharge: 2022-09-01 | Disposition: A | Discharge status: Home / Self Care | Payer: 59 | Source: Ambulatory Visit | Attending: Cardiology | Admitting: Cardiology

## 2022-09-01 DIAGNOSIS — I700 Atherosclerosis of aorta: Secondary | ICD-10-CM | POA: Diagnosis not present

## 2022-09-01 DIAGNOSIS — Z122 Encounter for screening for malignant neoplasm of respiratory organs: Secondary | ICD-10-CM | POA: Insufficient documentation

## 2022-09-01 DIAGNOSIS — Z87891 Personal history of nicotine dependence: Secondary | ICD-10-CM | POA: Diagnosis not present

## 2022-09-01 DIAGNOSIS — J439 Emphysema, unspecified: Secondary | ICD-10-CM | POA: Diagnosis not present

## 2022-09-01 DIAGNOSIS — I480 Paroxysmal atrial fibrillation: Secondary | ICD-10-CM | POA: Insufficient documentation

## 2022-09-01 DIAGNOSIS — D6869 Other thrombophilia: Secondary | ICD-10-CM | POA: Insufficient documentation

## 2022-09-01 DIAGNOSIS — I2510 Atherosclerotic heart disease of native coronary artery without angina pectoris: Secondary | ICD-10-CM | POA: Insufficient documentation

## 2022-09-01 MED ORDER — IOHEXOL 350 MG/ML SOLN
100.0000 mL | Freq: Once | INTRAVENOUS | Status: AC | PRN
  Administered 2022-09-01: 80 mL via INTRAVENOUS

## 2022-09-07 NOTE — Pre-Procedure Instructions (Signed)
Attempted to call patient regarding procedure instructions for tomorrow.  Left voice mail on the following items: Arrival time 1100 Nothing to eat or drink after midnight No meds AM of procedure Responsible person to drive you home and stay with you for 24 hrs  Have you missed any doses of anti-coagulant Eliquis- if you have missed any doses please let the office know right away.  Don't take Eliquis in the morning.

## 2022-09-08 ENCOUNTER — Ambulatory Visit (HOSPITAL_COMMUNITY)
Admission: RE | Admit: 2022-09-08 | Discharge: 2022-09-08 | Disposition: A | Discharge status: Home / Self Care | Payer: 59 | Attending: Cardiology | Admitting: Cardiology

## 2022-09-08 ENCOUNTER — Other Ambulatory Visit: Payer: Self-pay

## 2022-09-08 ENCOUNTER — Ambulatory Visit (HOSPITAL_BASED_OUTPATIENT_CLINIC_OR_DEPARTMENT_OTHER): Payer: 59 | Admitting: Anesthesiology

## 2022-09-08 ENCOUNTER — Ambulatory Visit (HOSPITAL_COMMUNITY): Payer: 59 | Admitting: Anesthesiology

## 2022-09-08 ENCOUNTER — Encounter (HOSPITAL_COMMUNITY)
Admission: RE | Disposition: A | Discharge status: Home / Self Care | Payer: Self-pay | Source: Home / Self Care | Attending: Cardiology

## 2022-09-08 DIAGNOSIS — Z8249 Family history of ischemic heart disease and other diseases of the circulatory system: Secondary | ICD-10-CM | POA: Insufficient documentation

## 2022-09-08 DIAGNOSIS — Z87891 Personal history of nicotine dependence: Secondary | ICD-10-CM | POA: Insufficient documentation

## 2022-09-08 DIAGNOSIS — K219 Gastro-esophageal reflux disease without esophagitis: Secondary | ICD-10-CM | POA: Diagnosis not present

## 2022-09-08 DIAGNOSIS — Z8669 Personal history of other diseases of the nervous system and sense organs: Secondary | ICD-10-CM | POA: Diagnosis not present

## 2022-09-08 DIAGNOSIS — E785 Hyperlipidemia, unspecified: Secondary | ICD-10-CM | POA: Insufficient documentation

## 2022-09-08 DIAGNOSIS — I509 Heart failure, unspecified: Secondary | ICD-10-CM

## 2022-09-08 DIAGNOSIS — I4891 Unspecified atrial fibrillation: Secondary | ICD-10-CM | POA: Diagnosis not present

## 2022-09-08 DIAGNOSIS — E1151 Type 2 diabetes mellitus with diabetic peripheral angiopathy without gangrene: Secondary | ICD-10-CM

## 2022-09-08 DIAGNOSIS — I110 Hypertensive heart disease with heart failure: Secondary | ICD-10-CM

## 2022-09-08 DIAGNOSIS — Z79899 Other long term (current) drug therapy: Secondary | ICD-10-CM | POA: Insufficient documentation

## 2022-09-08 DIAGNOSIS — J449 Chronic obstructive pulmonary disease, unspecified: Secondary | ICD-10-CM | POA: Insufficient documentation

## 2022-09-08 DIAGNOSIS — I480 Paroxysmal atrial fibrillation: Secondary | ICD-10-CM | POA: Diagnosis present

## 2022-09-08 DIAGNOSIS — I5032 Chronic diastolic (congestive) heart failure: Secondary | ICD-10-CM | POA: Diagnosis not present

## 2022-09-08 HISTORY — PX: ATRIAL FIBRILLATION ABLATION: EP1191

## 2022-09-08 LAB — POCT ACTIVATED CLOTTING TIME
Activated Clotting Time: 320 seconds
Activated Clotting Time: 325 seconds

## 2022-09-08 SURGERY — ATRIAL FIBRILLATION ABLATION
Anesthesia: General

## 2022-09-08 MED ORDER — HEPARIN (PORCINE) IN NACL 1000-0.9 UT/500ML-% IV SOLN
INTRAVENOUS | Status: DC | PRN
  Administered 2022-09-08 (×4): 500 mL

## 2022-09-08 MED ORDER — ONDANSETRON HCL 4 MG/2ML IJ SOLN
INTRAMUSCULAR | Status: AC
  Filled 2022-09-08: qty 2

## 2022-09-08 MED ORDER — ONDANSETRON HCL 4 MG/2ML IJ SOLN
INTRAMUSCULAR | Status: DC | PRN
  Administered 2022-09-08: 4 mg via INTRAVENOUS

## 2022-09-08 MED ORDER — ONDANSETRON HCL 4 MG/2ML IJ SOLN
4.0000 mg | Freq: Four times a day (QID) | INTRAMUSCULAR | Status: DC | PRN
  Administered 2022-09-08: 4 mg via INTRAVENOUS

## 2022-09-08 MED ORDER — SODIUM CHLORIDE 0.9 % IV SOLN: 250.0000 mL | INTRAVENOUS | Status: DC | PRN

## 2022-09-08 MED ORDER — SODIUM CHLORIDE 0.9 % IV SOLN: INTRAVENOUS | Status: DC

## 2022-09-08 MED ORDER — ACETAMINOPHEN 325 MG PO TABS
650.0000 mg | ORAL_TABLET | ORAL | Status: DC | PRN
  Administered 2022-09-08: 650 mg via ORAL
  Filled 2022-09-08: qty 2

## 2022-09-08 MED ORDER — SODIUM CHLORIDE 0.9% FLUSH: 3.0000 mL | INTRAVENOUS | Status: DC | PRN

## 2022-09-08 MED ORDER — PHENYLEPHRINE HCL-NACL 20-0.9 MG/250ML-% IV SOLN
INTRAVENOUS | Status: DC | PRN
  Administered 2022-09-08: 50 ug/min via INTRAVENOUS

## 2022-09-08 MED ORDER — MIDAZOLAM HCL 5 MG/5ML IJ SOLN
INTRAMUSCULAR | Status: DC | PRN
  Administered 2022-09-08: 2 mg via INTRAVENOUS

## 2022-09-08 MED ORDER — SUGAMMADEX SODIUM 200 MG/2ML IV SOLN
INTRAVENOUS | Status: DC | PRN
  Administered 2022-09-08: 193.2 mg via INTRAVENOUS

## 2022-09-08 MED ORDER — HEPARIN SODIUM (PORCINE) 1000 UNIT/ML IJ SOLN
INTRAMUSCULAR | Status: DC | PRN
  Administered 2022-09-08: 1000 [IU] via INTRAVENOUS

## 2022-09-08 MED ORDER — ROCURONIUM BROMIDE 10 MG/ML (PF) SYRINGE
PREFILLED_SYRINGE | INTRAVENOUS | Status: DC | PRN
  Administered 2022-09-08: 60 mg via INTRAVENOUS

## 2022-09-08 MED ORDER — DEXAMETHASONE SODIUM PHOSPHATE 10 MG/ML IJ SOLN
INTRAMUSCULAR | Status: DC | PRN
  Administered 2022-09-08: 10 mg via INTRAVENOUS

## 2022-09-08 MED ORDER — HEPARIN SODIUM (PORCINE) 1000 UNIT/ML IJ SOLN
INTRAMUSCULAR | Status: AC
  Filled 2022-09-08: qty 10

## 2022-09-08 MED ORDER — PROTAMINE SULFATE 10 MG/ML IV SOLN
INTRAVENOUS | Status: DC | PRN
  Administered 2022-09-08: 40 mg via INTRAVENOUS

## 2022-09-08 MED ORDER — FENTANYL CITRATE (PF) 250 MCG/5ML IJ SOLN
INTRAMUSCULAR | Status: DC | PRN
  Administered 2022-09-08 (×4): 25 ug via INTRAVENOUS

## 2022-09-08 MED ORDER — SODIUM CHLORIDE 0.9% FLUSH: 3.0000 mL | Freq: Two times a day (BID) | INTRAVENOUS | Status: DC

## 2022-09-08 MED ORDER — HEPARIN SODIUM (PORCINE) 1000 UNIT/ML IJ SOLN
INTRAMUSCULAR | Status: DC | PRN
  Administered 2022-09-08: 14000 [IU] via INTRAVENOUS
  Administered 2022-09-08 (×2): 2000 [IU] via INTRAVENOUS

## 2022-09-08 MED ORDER — DOBUTAMINE INFUSION FOR EP/ECHO/NUC (1000 MCG/ML)
INTRAVENOUS | Status: DC | PRN
  Administered 2022-09-08: 20 ug/kg/min via INTRAVENOUS

## 2022-09-08 MED ORDER — DOBUTAMINE INFUSION FOR EP/ECHO/NUC (1000 MCG/ML)
INTRAVENOUS | Status: AC
  Filled 2022-09-08: qty 250

## 2022-09-08 MED ORDER — PROPOFOL 10 MG/ML IV BOLUS
INTRAVENOUS | Status: DC | PRN
  Administered 2022-09-08: 160 mg via INTRAVENOUS

## 2022-09-08 SURGICAL SUPPLY — 23 items
BAG SNAP BAND KOVER 36X36 (MISCELLANEOUS) IMPLANT
CATH 8FR REPROCESSED SOUNDSTAR (CATHETERS) ×1 IMPLANT
CATH 8FR SOUNDSTAR REPROCESSED (CATHETERS) IMPLANT
CATH ABLAT QDOT MICRO BI TC DF (CATHETERS) IMPLANT
CATH OCTARAY 2.0 F 3-3-3-3-3 (CATHETERS) IMPLANT
CATH PIGTAIL STEERABLE D1 8.7 (WIRE) IMPLANT
CATH S-M CIRCA TEMP PROBE (CATHETERS) IMPLANT
CATH WEBSTER BI DIR CS D-F CRV (CATHETERS) IMPLANT
CLOSURE PERCLOSE PROSTYLE (VASCULAR PRODUCTS) IMPLANT
COVER SWIFTLINK CONNECTOR (BAG) ×1 IMPLANT
PACK EP LATEX FREE (CUSTOM PROCEDURE TRAY) ×1
PACK EP LF (CUSTOM PROCEDURE TRAY) ×1 IMPLANT
PAD DEFIB RADIO PHYSIO CONN (PAD) ×1 IMPLANT
PATCH CARTO3 (PAD) IMPLANT
SHEATH BAYLIS SUREFLEX  M 8.5 (SHEATH) ×1
SHEATH BAYLIS SUREFLEX M 8.5 (SHEATH) IMPLANT
SHEATH BAYLIS TRANSSEPTAL 98CM (NEEDLE) IMPLANT
SHEATH CARTO VIZIGO SM CVD (SHEATH) IMPLANT
SHEATH PINNACLE 7F 10CM (SHEATH) IMPLANT
SHEATH PINNACLE 8F 10CM (SHEATH) IMPLANT
SHEATH PINNACLE 9F 10CM (SHEATH) IMPLANT
SHEATH PROBE COVER 6X72 (BAG) IMPLANT
TUBING SMART ABLATE COOLFLOW (TUBING) IMPLANT

## 2022-09-08 NOTE — Progress Notes (Signed)
Pt with c/o being "hot" on arrival to cath lab holding, Bay 3, temp WNL, sweat wiped from pts face and cool wash cloth placed to forehead, linens and socks removed, gown remains in place, pt not diabetic, Purewick placed at pt request to "pee", peri care given, tolerated well, Few ice chips given, toelrated well, safety maintained

## 2022-09-08 NOTE — Anesthesia Preprocedure Evaluation (Addendum)
Anesthesia Evaluation  Patient identified by MRN, date of birth, ID band Patient awake    Reviewed: Allergy & Precautions, NPO status , Patient's Chart, lab work & pertinent test results, reviewed documented beta blocker date and time   History of Anesthesia Complications (+) PONV and history of anesthetic complications  Airway Mallampati: II  TM Distance: <3 FB Neck ROM: Full    Dental  (+) Partial Lower, Partial Upper, Dental Advisory Given,    Pulmonary asthma , pneumonia, COPD, former smoker   breath sounds clear to auscultation       Cardiovascular hypertension, Pt. on medications + Peripheral Vascular Disease and +CHF   Rhythm:Regular Rate:Normal  ECHO 23 Patient mild aortic stenosis. Normal EF   Neuro/Psych Seizures -,  PSYCHIATRIC DISORDERS Anxiety Depression    IMPRESSION: 1. Minor carotid atherosclerosis. Negative for stenosis. Degree of narrowing less than 50% bilaterally by ultrasound criteria. 2. Patent antegrade vertebral flow bilaterally.     GI/Hepatic ,GERD  ,,  Endo/Other  diabetes    Renal/GU negative Renal ROS     Musculoskeletal  (+) Arthritis ,    Abdominal   Peds  Hematology negative hematology ROS (+)   Anesthesia Other Findings   Reproductive/Obstetrics                             Anesthesia Physical Anesthesia Plan  ASA: 3  Anesthesia Plan: General   Post-op Pain Management: Minimal or no pain anticipated   Induction: Intravenous  PONV Risk Score and Plan: Ondansetron, Dexamethasone and Treatment may vary due to age or medical condition  Airway Management Planned: Oral ETT  Additional Equipment: None  Intra-op Plan:   Post-operative Plan: Extubation in OR  Informed Consent: I have reviewed the patients History and Physical, chart, labs and discussed the procedure including the risks, benefits and alternatives for the proposed anesthesia with  the patient or authorized representative who has indicated his/her understanding and acceptance.       Plan Discussed with: Anesthesiologist and CRNA  Anesthesia Plan Comments:         Anesthesia Quick Evaluation

## 2022-09-08 NOTE — Discharge Instructions (Signed)
Cardiac Ablation, Care After  This sheet gives you information about how to care for yourself after your procedure. Your health care provider may also give you more specific instructions. If you have problems or questions, contact your health care provider. What can I expect after the procedure? After the procedure, it is common to have: Bruising around your puncture site. Tenderness around your puncture site. Skipped heartbeats. If you had an atrial fibrillation ablation, you may have atrial fibrillation during the first several months after your procedure.  Tiredness (fatigue).  Follow these instructions at home: Puncture site care  Follow instructions from your health care provider about how to take care of your puncture site. Make sure you: If present, leave stitches (sutures), skin glue, or adhesive strips in place. These skin closures may need to stay in place for up to 2 weeks. If adhesive strip edges start to loosen and curl up, you may trim the loose edges. Do not remove adhesive strips completely unless your health care provider tells you to do that. If a large square bandage is present, this may be removed 24 hours after surgery.  Check your puncture site every day for signs of infection. Check for: Redness, swelling, or pain. Fluid or blood. If your puncture site starts to bleed, lie down on your back, apply firm pressure to the area, and contact your health care provider. Warmth. Pus or a bad smell. A pea or small marble sized lump at the site is normal and can take up to three months to resolve.  Driving Do not drive for at least 4 days after your procedure or however long your health care provider recommends. (Do not resume driving if you have previously been instructed not to drive for other health reasons.) Do not drive or use heavy machinery while taking prescription pain medicine. Activity Avoid activities that take a lot of effort for at least 7 days after your  procedure. Do not lift anything that is heavier than 5 lb (4.5 kg) for one week.  No sexual activity for 1 week.  Return to your normal activities as told by your health care provider. Ask your health care provider what activities are safe for you. General instructions Take over-the-counter and prescription medicines only as told by your health care provider. Do not use any products that contain nicotine or tobacco, such as cigarettes and e-cigarettes. If you need help quitting, ask your health care provider. You may shower after 24 hours, but Do not take baths, swim, or use a hot tub for 1 week.  Do not drink alcohol for 24 hours after your procedure. Keep all follow-up visits as told by your health care provider. This is important. Contact a health care provider if: You have redness, mild swelling, or pain around your puncture site. You have fluid or blood coming from your puncture site that stops after applying firm pressure to the area. Your puncture site feels warm to the touch. You have pus or a bad smell coming from your puncture site. You have a fever. You have chest pain or discomfort that spreads to your neck, jaw, or arm. You have chest pain that is worse with lying on your back or taking a deep breath. You are sweating a lot. You feel nauseous. You have a fast or irregular heartbeat. You have shortness of breath. You are dizzy or light-headed and feel the need to lie down. You have pain or numbness in the arm or leg closest to your puncture  site. Get help right away if: Your puncture site suddenly swells. Your puncture site is bleeding and the bleeding does not stop after applying firm pressure to the area. These symptoms may represent a serious problem that is an emergency. Do not wait to see if the symptoms will go away. Get medical help right away. Call your local emergency services (911 in the [place] Do not drive yourself to the hospital. Summary After the procedure, it  is normal to have bruising and tenderness at the puncture site in your groin, neck, or forearm. Check your puncture site every day for signs of infection. Get help right away if your puncture site is bleeding and the bleeding does not stop after applying firm pressure to the area. This is a medical emergency. This information is not intended to replace advice given to you by your health care provider. Make sure you discuss any questions you have with your health care provider. Femoral Site Care This sheet gives you information about how to care for yourself after your procedure. Your health care provider may also give you more specific instructions. If you have problems or questions, contact your health care provider. What can I expect after the procedure?  After the procedure, it is common to have: Bruising that usually fades within 1-2 weeks. Tenderness at the site. Follow these instructions at home: Wound care Follow instructions from your health care provider about how to take care of your insertion site. Make sure you: Wash your hands with soap and water before you change your bandage (dressing). If soap and water are not available, use hand sanitizer. Remove your dressing as told by your health care provider. In 24 hours Do not take baths, swim, or use a hot tub until your health care provider approves. You may shower 24-48 hours after the procedure or as told by your health care provider. Gently wash the site with plain soap and water. Pat the area dry with a clean towel. Do not rub the site. This may cause bleeding. Do not apply powder or lotion to the site. Keep the site clean and dry. Check your femoral site every day for signs of infection. Check for: Redness, swelling, or pain. Fluid or blood. Warmth. Pus or a bad smell. Activity For the first 2-3 days after your procedure, or as long as directed: Avoid climbing stairs as much as possible. Do not squat. Do not lift anything  that is heavier than 10 lb (4.5 kg), or the limit that you are told, until your health care provider says that it is safe. For 5 days Rest as directed. Avoid sitting for a long time without moving. Get up to take short walks every 1-2 hours. Do not drive for 24 hours if you were given a medicine to help you relax (sedative). General instructions Take over-the-counter and prescription medicines only as told by your health care provider. Keep all follow-up visits as told by your health care provider. This is important. Contact a health care provider if you have: A fever or chills. You have redness, swelling, or pain around your insertion site. Get help right away if: The catheter insertion area swells very fast. You pass out. You suddenly start to sweat or your skin gets clammy. The catheter insertion area is bleeding, and the bleeding does not stop when you hold steady pressure on the area. The area near or just beyond the catheter insertion site becomes pale, cool, tingly, or numb. These symptoms may represent a serious  problem that is an emergency. Do not wait to see if the symptoms will go away. Get medical help right away. Call your local emergency services (911 in the [place] Do not drive yourself to the hospital. Summary After the procedure, it is common to have bruising that usually fades within 1-2 weeks. Check your femoral site every day for signs of infection. Do not lift anything that is heavier than 10 lb (4.5 kg), or the limit that you are told, until your health care provider says that it is safe. This information is not intended to replace advice given to you by your health care provider. Make sure you discuss any questions you have with your health care provider. Document Revised: 07/24/2017 Document Reviewed: 07/24/2017 Elsevier Patient Education  2020 [employer].

## 2022-09-08 NOTE — Anesthesia Procedure Notes (Signed)
Procedure Name: Intubation Date/Time: 09/08/2022 2:06 PM  Performed by: Maude Leriche, CRNAPre-anesthesia Checklist: Patient identified, Emergency Drugs available, Suction available and Patient being monitored Patient Re-evaluated:Patient Re-evaluated prior to induction Oxygen Delivery Method: Circle system utilized Preoxygenation: Pre-oxygenation with 100% oxygen Induction Type: IV induction Ventilation: Mask ventilation without difficulty Laryngoscope Size: Miller and 2 Tube type: Oral Tube size: 7.0 mm Number of attempts: 1 Airway Equipment and Method: Oral airway and Bite block Placement Confirmation: ETT inserted through vocal cords under direct vision, positive ETCO2 and breath sounds checked- equal and bilateral Secured at: 21 cm Tube secured with: Tape Dental Injury: Teeth and Oropharynx as per pre-operative assessment

## 2022-09-08 NOTE — H&P (Signed)
Electrophysiology Office Note   Date:  09/08/2022   ID:  Talar, Ojeda May 16, 1956, MRN [id number]  PCP:  Roselee Nova, MD  Cardiologist:  Revank[facility] Primary Electrophysiologist:  Jaiya Mooradian Meredith Leeds, MD    Chief Complaint: AF   History of Present Illness: Felicia Odom is a 67 y.o. female who is being seen today for the evaluation of AF at the request of No ref. provider found. Presenting today for electrophysiology evaluation.  She has a history significant for aortic atherosclerosis, hypokalemia, Hypertension, hyperlipidemia, COPD, atrial fibrillation.  She has had multiple ER visits for atrial fibrillation with rapid rates.  She is currently on flecainide.  Recent ER visit was not on 04/04/2022.  She was started on diltiazem and converted to sinus rhythm.  Her flecainide was increased at the time.  I did increase dose of flecainide, she is continued to have episodes of atrial fibrillation.  She feels quite poorly in atrial fibrillation with weakness, fatigue, shortness of breath.  She also gets quite a bit of anxiety when she is in A-fib.  Today, denies symptoms of palpitations, chest pain, shortness of breath, orthopnea, PND, lower extremity edema, claudication, dizziness, presyncope, syncope, bleeding, or neurologic sequela. The patient is tolerating medications without difficulties. Plan ablation today.    Past Medical History:  Diagnosis Date   Anxiety    Aortic atherosclerosis ([place]) 10/06/2021   Arthritis    neck   Cannabis dependence with current use ([place]) 07/26/2016   Chronic back pain    Chronic bronchitis (HCC)    Chronic diastolic CHF (congestive heart failure) (HCC)    Chronic neck pain    Cocaine abuse ([place]) 04/05/2021   COPD mixed type ([place]) 02/18/2019   Office Spirometry 10/21/2015-slight restriction of exhaled volume, mild obstruction. FVC 2.69/78%, FEV1 2.04/76%, FEV1/FVC 0.76, FEF 25-75 percent 1.68/65% PFT 04/27/2016-minimal obstructive  airways disease, minimal diffusion defect, insignificant response to bronchodilator. FVC 2.79/78%, FEV1 2.11/76%, ratio 0.76, FEF 25-75 percent 1.81/72%, TLC 92%, DLCO 76%.   DDD (degenerative disc disease), cervical 06/15/2015   Diverticulosis    Eczema 11/29/2017   Encounter for long-term opiate analgesic use 07/06/2017   Essential hypertension 09/25/2015   GERD (gastroesophageal reflux disease)    takes Nexium daily   Helicobacter pylori gastritis 08/2019   Tx and eradicated   Hyperlipidemia    not on any meds bc refused to pick up from pharmacy   Long-term current use of opiate analgesic 04/05/2021   Moderate episode of recurrent major depressive disorder ([place]) 01/28/2020   Obesity (BMI 30.0-34.9) 12/16/2021   Obsessive-compulsive disorder 06/08/2007   Qualifier: Diagnosis of  By: June Leap     PAF (paroxysmal atrial fibrillation) ([place]) 12/16/2021   PANIC DISORDER 06/08/2007   Qualifier: Diagnosis of  By: June Leap     Pneumonia    2016   PONV (postoperative nausea and vomiting)    Primary osteoarthritis involving multiple joints 01/21/2015   Seborrheic dermatitis of scalp 02/11/2015   Seizures ([place])    in the 80's   Thyroid nodule 07/30/2020   IMPRESSION: No significant interval change of bilateral thyroid nodules.   The above is in keeping with the ACR TI-RADS recommendations - J Am Coll Radiol 2017;14:587-595.     Electronically Signed   By: Miachel Roux M.D.   On: 08/17/2020 14:18   Type II diabetes mellitus with manifestations ([place]) 02/18/2019   The 10-year ASCVD risk score Mikey Bussing DC Jr., et al., 2013) is: 9.1%  Values used to calculate the score:     Age: 15 years     Sex: Female     Is Non-Hispanic African American: No     Diabetic: Yes     Tobacco smoker: No     Systolic Blood Pressure: 0000000 mmHg     Is BP treated: No     HDL Cholesterol: 46.2 mg/dL     Total Cholesterol: 180 mg/dL   Past Surgical History:  Procedure Laterality Date   COLONOSCOPY     ECTOPIC  PREGNANCY SURGERY     x 2    ESOPHAGOGASTRODUODENOSCOPY     LAPAROSCOPIC LYSIS INTESTINAL ADHESIONS     x 2   RESECTION OF MEDIASTINAL MASS N/A 06/10/2016   Procedure: RESECTION OF MEDIASTINAL MASS;  Surgeon: Grace Isaac, MD;  Location: [facility];  Service: Thoracic;  Laterality: N/A;   VIDEO ASSISTED THORACOSCOPY Right 06/10/2016   Procedure: VIDEO ASSISTED THORACOSCOPY WITH PLACEMENT OF ON Q TUNNELER;  Surgeon: Grace Isaac, MD;  Location: [facility];  Service: Thoracic;  Laterality: Right;   VIDEO BRONCHOSCOPY N/A 06/10/2016   Procedure: VIDEO BRONCHOSCOPY;  Surgeon: Grace Isaac, MD;  Location: [facility];  Service: Thoracic;  Laterality: N/A;     Current Facility-Administered Medications  Medication Dose Route Frequency Provider Last Rate Last Admin   0.9 %  sodium chloride infusion   Intravenous Continuous Constance Haw, MD 50 mL/hr at 09/08/22 1232 Continued from Pre-op at 09/08/22 1232    Allergies:   Sulfa antibiotics   Social History:  The patient  reports that she quit smoking about 7 years ago. Her smoking use included cigarettes. She has a 36.00 pack-year smoking history. She has never used smokeless tobacco. She reports that she does not drink alcohol and does not use drugs.   Family History:  The patient's family history includes Breast cancer in her sister; Clotting disorder in her mother; Colon cancer in her sister; Diabetes in her mother; Heart attack in her father; Heart failure in her father and mother; Kidney disease in her mother; Prostate cancer in her father.   ROS:  Please see the history of present illness.   Otherwise, review of systems is positive for none.   All other systems are reviewed and negative.   PHYSICAL EXAM: VS:  BP 122/65   Pulse 62   Temp (!) 97.2 F (36.2 C) (Temporal)   Resp 16   Ht 5' 6"$  (1.676 m)   Wt 96.6 kg   SpO2 100%   BMI 34.38 kg/m  , BMI Body mass index is 34.38 kg/m. GEN: Well nourished, well developed, in no acute  distress  HEENT: normal  Neck: no JVD, carotid bruits, or masses Cardiac: RRR; no murmurs, rubs, or gallops,no edema  Respiratory:  clear to auscultation bilaterally, normal work of breathing GI: soft, nontender, nondistended, + BS MS: no deformity or atrophy  Skin: warm and dry Neuro:  Strength and sensation are intact Psych: euthymic mood, full affect   Recent Labs: 04/04/2022: ALT 17; B Natriuretic Peptide 59.9 04/12/2022: Magnesium 2.0 08/25/2022: BUN 18; Creatinine, Ser 0.95; Hemoglobin 14.4; Platelets 269; Potassium 4.0; Sodium 143    Lipid Panel     Component Value Date/Time   CHOL 206 (H) 06/14/2021 1122   TRIG 206 (H) 06/14/2021 1122   HDL 46 06/14/2021 1122   CHOLHDL 4.5 (H) 06/14/2021 1122   CHOLHDL 4 10/28/2020 0827   VLDL 30.2 10/28/2020 0827   LDLCALC 124 (H) 06/14/2021  1122   LDLDIRECT 142.0 01/21/2015 1537     Wt Readings from Last 3 Encounters:  09/08/22 96.6 kg  09/01/22 96.6 kg  06/06/22 96.9 kg      Other studies Reviewed: Additional studies/ records that were reviewed today include: TTE 8/29/*23  Review of the above records today demonstrates:   1. Left ventricular ejection fraction, by estimation, is 60 to 65%. The  left ventricle has normal function. The left ventricle has no regional  wall motion abnormalities. Left ventricular diastolic parameters are  consistent with Grade I diastolic  dysfunction (impaired relaxation).   2. Left atrial size was mildly dilated.   3. Aortic valve regurgitation is mild. Mild aortic valve stenosis.   Monitor 03/02/2022 personally reviewed Atrial Fibrillation/Flutter 1 event lasting 5 hours and 24 minutes occurred (3% burden), ranging from 87-178 bpm (avg of 135 bpm), the longest lasting 5 hours 24 mins with an avg rate of 135 bpm. Heart rate during atrial fibrillation exceeded 110 bpm 87% of the time.   Atrial Fibrillation/Flutter was detected within +/- 45 seconds of symptomatic patient event(s). Isolated SVEs  were rare (<1.0%), SVE Couplets were rare (<1.0%), and SVE Triplets were rare (<1.0%).   ASSESSMENT AND PLAN:  1.  Paroxysmal atrial fibrillation: KIMBERLIE TINNEY has presented today for surgery, with the diagnosis of AF.  The various methods of treatment have been discussed with the patient and family. After consideration of risks, benefits and other options for treatment, the patient has consented to  Procedure(s): Catheter ablation as a surgical intervention .  Risks include but not limited to complete heart block, stroke, esophageal damage, nerve damage, bleeding, vascular damage, tamponade, perforation, MI, and death. The patient's history has been reviewed, patient examined, no change in status, stable for surgery.  I have reviewed the patient's chart and labs.  Questions were answered to the patient's satisfaction.    Damarrion Mimbs Curt Bears, MD 09/08/2022 1:18 PM

## 2022-09-08 NOTE — Transfer of Care (Signed)
Immediate Anesthesia Transfer of Care Note  Patient: Felicia Odom  Procedure(s) Performed: ATRIAL FIBRILLATION ABLATION  Patient Location: Cath Lab  Anesthesia Type:General  Level of Consciousness: awake, alert , and oriented  Airway & Oxygen Therapy: Patient Spontanous Breathing and Patient connected to nasal cannula oxygen  Post-op Assessment: Report given to RN, Post -op Vital signs reviewed and stable, Patient moving all extremities X 4, and Patient able to stick tongue midline  Post vital signs: Reviewed  Last Vitals:  Vitals Value Taken Time  BP 127/60 09/08/22 1556  Temp 97.6   Pulse 73 09/08/22 1558  Resp 21 09/08/22 1558  SpO2 91 % 09/08/22 1558  Vitals shown include unvalidated device data.  Last Pain:  Vitals:   09/08/22 1131  TempSrc: Temporal  PainSc:          Complications: There were no known notable events for this encounter.

## 2022-09-09 ENCOUNTER — Encounter (HOSPITAL_COMMUNITY): Payer: Self-pay | Admitting: Cardiology

## 2022-09-09 NOTE — Anesthesia Postprocedure Evaluation (Signed)
Anesthesia Post Note  Patient: Felicia Odom  Procedure(s) Performed: ATRIAL FIBRILLATION ABLATION     Patient location during evaluation: Cath Lab Anesthesia Type: General Level of consciousness: awake Pain management: pain level controlled Vital Signs Assessment: post-procedure vital signs reviewed and stable Respiratory status: spontaneous breathing, nonlabored ventilation and respiratory function stable Cardiovascular status: blood pressure returned to baseline and stable Postop Assessment: no apparent nausea or vomiting Anesthetic complications: no   There were no known notable events for this encounter.  Last Vitals:  Vitals:   09/08/22 1915 09/08/22 1930  BP: 129/73 127/72  Pulse: 66 68  Resp: 18 (!) 26  Temp:    SpO2: 97% 97%    Last Pain:  Vitals:   09/08/22 1930  TempSrc:   PainSc: 1                  Stuti Sandin P Smith [place]

## 2022-09-13 ENCOUNTER — Ambulatory Visit: Payer: Medicare Other | Admitting: Cardiology

## 2022-09-13 ENCOUNTER — Telehealth: Payer: Self-pay | Admitting: Cardiology

## 2022-09-13 NOTE — Telephone Encounter (Signed)
Pt c/o medication issue:  1. Name of Medication:   diltiazem (CARDIZEM CD) 120 MG 24 hr capsule    2. How are you currently taking this medication (dosage and times per day)? Take 1 capsule (120 mg total) by mouth daily.   3. Are you having a reaction (difficulty breathing--STAT)? No  4. What is your medication issue? Pt states that after picking medication refill up, the label has HCL on it. She wants to make sure that this is the same medication. Please advise.

## 2022-09-13 NOTE — Telephone Encounter (Signed)
Pt voiced concern over the different manufacturer of diltiazem. Explained the medication works the same. She wants to bring all of her medications so Dr. Percival Spanish can see them on 3/14. She wants all of her cardiac meds ordered by Dr. Percival Spanish and not her PCP.

## 2022-09-19 ENCOUNTER — Telehealth: Payer: Self-pay | Admitting: Internal Medicine

## 2022-09-19 MED ORDER — AZITHROMYCIN 250 MG PO TABS: ORAL_TABLET | ORAL | 0 refills | Status: DC

## 2022-09-19 NOTE — Telephone Encounter (Signed)
Called and spoke with patient. Patient states she started coughing on Friday. Patient stated that she is coughing up thick phlegm. Patient also stated that her phlegm is a mixture of green and yellow.  Patient stated she just had a heart ablasian and she wants to know if we can send in a z-pak to her pharmacy.   CY please advise.

## 2022-09-19 NOTE — Telephone Encounter (Signed)
Called and spoke with patient. Advised patient I would send in z pak. Patient verbalized understanding. Patient verified pharmacy.   Nothing further needed.

## 2022-09-19 NOTE — Telephone Encounter (Signed)
Please send Zpak. Hope she fills better

## 2022-09-19 NOTE — Telephone Encounter (Signed)
PT calling in for a Z-pac due to cough and very thick phlegm. Offered appt but PT dcln. Just had a heart ablasion for AFIB and does not want to go outside.    [facility] on [address] is NEW pharm. Do not call [facility], please.

## 2022-10-05 ENCOUNTER — Encounter: Payer: Self-pay | Admitting: Internal Medicine

## 2022-10-05 NOTE — Progress Notes (Unsigned)
Cardiology Office Note   Date:  10/06/2022   ID:  Felicia Odom, Felicia Odom 12-14-1955, MRN [id number]  PCP:  Roselee Nova, MD  Cardiologist:   Minus Breeding, MD   Chief Complaint  Patient presents with   Atrial Fibrillation      History of Present Illness: Felicia Odom is a 67 y.o. female who presents for evaluation of atrial fib.    She was in the ED and 03/25/2022 for this.  She was in atrial fib. She converted back to NSR after IV Cardizem drip.  She did not appear to be volume overloaded.  She was started on spironolactone to help with volume and hypokalemia.   Echo on 8/29 demonstrated normal LV function with mild AS.  Of note her event monitor which she wore in August demonstrated paroxysms of A-fib.  She has sinus bradycardia with pauses of 3 seconds as well.  She had nonsustained ventricular tachycardia up to 5 beats.  Her atrial rate with fibrillation averaged at 135.   She saw Dr. Curt Bears and now is status post ablation.    She thinks she has done well since that time.  She does not report any sustained atrial arrhythmias.  She is not having any new chest pressure, neck or arm discomfort.  She is disappointed that she cannot lose weight.  She is not having any new shortness of breath, PND or orthopnea.  Past Medical History:  Diagnosis Date   Anxiety    Aortic atherosclerosis ([place]) 10/06/2021   Arthritis    neck   Cannabis dependence with current use ([place]) 07/26/2016   Chronic back pain    Chronic bronchitis ([facility])    Chronic diastolic CHF (congestive heart failure) ([facility])    Chronic neck pain    Cocaine abuse ([place]) 04/05/2021   COPD mixed type ([place]) 02/18/2019   Office Spirometry 10/21/2015-slight restriction of exhaled volume, mild obstruction. FVC 2.69/78%, FEV1 2.04/76%, FEV1/FVC 0.76, FEF 25-75 percent 1.68/65% PFT 04/27/2016-minimal obstructive airways disease, minimal diffusion defect, insignificant response to bronchodilator. FVC 2.79/78%, FEV1  2.11/76%, ratio 0.76, FEF 25-75 percent 1.81/72%, TLC 92%, DLCO 76%.   DDD (degenerative disc disease), cervical 06/15/2015   Diverticulosis    Eczema 11/29/2017   Encounter for long-term opiate analgesic use 07/06/2017   Essential hypertension 09/25/2015   GERD (gastroesophageal reflux disease)    takes Nexium daily   Helicobacter pylori gastritis 08/2019   Tx and eradicated   Hyperlipidemia    not on any meds bc refused to pick up from pharmacy   Long-term current use of opiate analgesic 04/05/2021   Moderate episode of recurrent major depressive disorder ([place]) 01/28/2020   Obesity (BMI 30.0-34.9) 12/16/2021   Obsessive-compulsive disorder 06/08/2007   Qualifier: Diagnosis of  By: June Leap     PAF (paroxysmal atrial fibrillation) ([place]) 12/16/2021   PANIC DISORDER 06/08/2007   Qualifier: Diagnosis of  By: June Leap     Pneumonia    2016   PONV (postoperative nausea and vomiting)    Primary osteoarthritis involving multiple joints 01/21/2015   Seborrheic dermatitis of scalp 02/11/2015   Seizures ([place])    in the 80's   Thyroid nodule 07/30/2020   IMPRESSION: No significant interval change of bilateral thyroid nodules.   The above is in keeping with the ACR TI-RADS recommendations - J Am Coll Radiol 2017;14:587-595.     Electronically Signed   By: Miachel Roux M.D.   On: 08/17/2020 14:18   Type II  diabetes mellitus with manifestations ([place]) 02/18/2019   The 10-year ASCVD risk score Mikey Bussing DC Brooke Bonito., et al., 2013) is: 9.1%   Values used to calculate the score:     Age: 65 years     Sex: Female     Is Non-Hispanic African American: No     Diabetic: Yes     Tobacco smoker: No     Systolic Blood Pressure: 0000000 mmHg     Is BP treated: No     HDL Cholesterol: 46.2 mg/dL     Total Cholesterol: 180 mg/dL    Past Surgical History:  Procedure Laterality Date   ATRIAL FIBRILLATION ABLATION N/A 09/08/2022   Procedure: ATRIAL FIBRILLATION ABLATION;  Surgeon: Constance Haw, MD;   Location: [facility];  Service: Cardiovascular;  Laterality: N/A;   COLONOSCOPY     ECTOPIC PREGNANCY SURGERY     x 2    ESOPHAGOGASTRODUODENOSCOPY     LAPAROSCOPIC LYSIS INTESTINAL ADHESIONS     x 2   RESECTION OF MEDIASTINAL MASS N/A 06/10/2016   Procedure: RESECTION OF MEDIASTINAL MASS;  Surgeon: Grace Isaac, MD;  Location: [facility];  Service: Thoracic;  Laterality: N/A;   VIDEO ASSISTED THORACOSCOPY Right 06/10/2016   Procedure: VIDEO ASSISTED THORACOSCOPY WITH PLACEMENT OF ON Q TUNNELER;  Surgeon: Grace Isaac, MD;  Location: [facility];  Service: Thoracic;  Laterality: Right;   VIDEO BRONCHOSCOPY N/A 06/10/2016   Procedure: VIDEO BRONCHOSCOPY;  Surgeon: Grace Isaac, MD;  Location: [facility];  Service: Thoracic;  Laterality: N/A;     Current Outpatient Medications  Medication Sig Dispense Refill   acetaminophen (TYLENOL) 500 MG tablet Take 1,000 mg by mouth every 6 (six) hours as needed for fever or headache.     albuterol (PROVENTIL) (2.5 MG/3ML) 0.083% nebulizer solution USE 1 VIAL IN NEBULIZER EVERY 6 HOURS - And As Needed (Patient taking differently: Take 2.5 mg by nebulization every 6 (six) hours as needed for shortness of breath.) 120 mL 11   albuterol (VENTOLIN HFA) 108 (90 Base) MCG/ACT inhaler INHALE 1 TO 2 PUFFS INTO THE LUNGS EVERY 4 HOURS AS NEEDED FOR WHEEZING OR SHORTNESS OF BREATH 18 each 12   ALPRAZolam (XANAX) 1 MG tablet TAKE 1 TABLET BY MOUTH EVERY 6 HOURS AS NEEDED FOR ANXIETY 120 tablet 3   amiodarone (PACERONE) 200 MG tablet TAKE 2 TABLETS (400 MG TOTAL) TWICE A DAY FOR 2 WEEKS, THEN TAKE 1 TABLET (200 MG TOTAL) ONCE A DAY (Patient taking differently: Take 200 mg by mouth daily.) 216 tablet 3   apixaban (ELIQUIS) 5 MG TABS tablet Take 1 tablet (5 mg total) by mouth 2 (two) times daily. 180 tablet 3   clindamycin (CLEOCIN T) 1 % external solution Apply 1 Application topically daily as needed (itching).     diltiazem (CARDIZEM CD) 120 MG 24 hr capsule  Take 1 capsule (120 mg total) by mouth daily. 90 capsule 3   diltiazem (CARDIZEM) 30 MG tablet TAKE 1 TABLET BY MOUTH EVERY 4 HOURS AS NEEDED (ELEVATED HEART RATE) 90 tablet 3   esomeprazole (NEXIUM) 20 MG capsule Take 20 mg by mouth daily as needed (acid reflux).     furosemide (LASIX) 40 MG tablet Take 1 tablet (40 mg total) by mouth 2 (two) times daily. 180 tablet 3   Multiple Vitamin (MULTIVITAMIN) tablet Take 1 tablet by mouth daily.     rosuvastatin (CRESTOR) 5 MG tablet Take 1 tablet (5 mg total) by mouth daily. 90 tablet  3   spironolactone (ALDACTONE) 25 MG tablet Take 0.5 tablets (12.5 mg total) by mouth daily. 45 tablet 3   valACYclovir (VALTREX) 1000 MG tablet Take 1,000 mg by mouth 2 (two) times daily as needed (fever blisters).     Vitamin D, Ergocalciferol, (DRISDOL) 1.25 MG (50000 UNIT) CAPS capsule Take 1 capsule (50,000 Units total) by mouth once a week. Fridays 5 capsule 2   acetaminophen-codeine (TYLENOL #2) 300-15 MG tablet TAKE 1 TO 2 TABLETS BY MOUTH EVERY 6 HOURS AS NEEDED (Patient not taking: Reported on 10/06/2022) 30 tablet 0   No current facility-administered medications for this visit.    Allergies:   Sulfa antibiotics    ROS:  Please see the history of present illness.   Otherwise, review of systems are positive for none.   All other systems are reviewed and negative.    PHYSICAL EXAM: VS:  BP 120/70   Pulse 78   Ht '5\' 6"'$  (1.676 m)   Wt 209 lb (94.8 kg)   SpO2 98%   BMI 33.73 kg/m  , BMI Body mass index is 33.73 kg/m. GENERAL:  Well appearing NECK:  No jugular venous distention, waveform within normal limits, carotid upstroke brisk and symmetric, no bruits, no thyromegaly LUNGS:  Clear to auscultation bilaterally CHEST:  Unremarkable HEART:  PMI not displaced or sustained,S1 and S2 within normal limits, no S3, no S4, no clicks, no rubs, 2/6 systolic early peaking murmur, no diastolic murmurs ABD:  Flat, positive bowel sounds normal in frequency in pitch,  no bruits, no rebound, no guarding, no midline pulsatile mass, no hepatomegaly, no splenomegaly EXT:  2 plus pulses throughout, no edema, no cyanosis no clubbing   EKG:  EKG is not ordered today.    Recent Labs: 04/04/2022: ALT 17; B Natriuretic Peptide 59.9 04/12/2022: Magnesium 2.0 08/25/2022: BUN 18; Creatinine, Ser 0.95; Hemoglobin 14.4; Platelets 269; Potassium 4.0; Sodium 143    Lipid Panel    Component Value Date/Time   CHOL 206 (H) 06/14/2021 1122   TRIG 206 (H) 06/14/2021 1122   HDL 46 06/14/2021 1122   CHOLHDL 4.5 (H) 06/14/2021 1122   CHOLHDL 4 10/28/2020 0827   VLDL 30.2 10/28/2020 0827   LDLCALC 124 (H) 06/14/2021 1122   LDLDIRECT 142.0 01/21/2015 1537      Wt Readings from Last 3 Encounters:  10/06/22 209 lb (94.8 kg)  09/08/22 213 lb (96.6 kg)  09/01/22 213 lb (96.6 kg)      Other studies Reviewed: Additional studies/ records that were reviewed today include:  None Review of the above records demonstrates:  NA   ASSESSMENT AND PLAN:  Atrial fib with RVR:    She seems to be maintaining sinus rhythm.  For now I am going to keep her on amiodarone.  This will probably be discontinued when she sees Dr. Curt Bears.  She will remain on anticoagulation.  Hypercoaguable state: She tolerates anticoagulation as above.  Aortic atherosclerosis:    With this I would like to keep her LDL where it is in she will continue on the low-dose Crestor.   Murmur: She has mild aortic stenosis.  I will follow this clinically.  Current medicines are reviewed at length with the patient today.  The patient does not have concerns regarding medicines.  The following changes have been made:   None  Labs/ tests ordered today include:  None  No orders of the defined types were placed in this encounter.    Disposition:   FU with me  in six months.     Signed, Minus Breeding, MD  10/06/2022 9:03 AM    [facility]

## 2022-10-06 ENCOUNTER — Ambulatory Visit (HOSPITAL_COMMUNITY): Payer: Medicare Other | Admitting: Physician Assistant

## 2022-10-06 ENCOUNTER — Encounter: Payer: Self-pay | Admitting: Cardiology

## 2022-10-06 ENCOUNTER — Ambulatory Visit: Payer: 59 | Attending: Cardiology | Admitting: Cardiology

## 2022-10-06 VITALS — BP 120/70 | HR 78 | Ht 66.0 in | Wt 209.0 lb

## 2022-10-06 DIAGNOSIS — I4891 Unspecified atrial fibrillation: Secondary | ICD-10-CM | POA: Diagnosis not present

## 2022-10-06 DIAGNOSIS — I350 Nonrheumatic aortic (valve) stenosis: Secondary | ICD-10-CM

## 2022-10-06 DIAGNOSIS — D6869 Other thrombophilia: Secondary | ICD-10-CM | POA: Diagnosis not present

## 2022-10-06 DIAGNOSIS — I700 Atherosclerosis of aorta: Secondary | ICD-10-CM | POA: Diagnosis not present

## 2022-10-06 DIAGNOSIS — I480 Paroxysmal atrial fibrillation: Secondary | ICD-10-CM

## 2022-10-06 NOTE — Patient Instructions (Addendum)
Medication Instructions:   Your physician recommends that you continue on your current medications as directed. Please refer to the Current Medication list given to you today.  *If you need a refill on your cardiac medications before your next appointment, please call your pharmacy*  Lab Work: NONE ordered at this time of appointment   If you have labs (blood work) drawn today and your tests are completely normal, you will receive your results only by: [place] (if you have MyChart) OR A paper copy in the mail If you have any lab test that is abnormal or we need to change your treatment, we will call you to review the results.  Testing/Procedures: NONE ordered at this time of appointment   Follow-Up: At [facility], you and your health needs are our priority.  As part of our continuing mission to provide you with exceptional heart care, we have created designated Provider Care Teams.  These Care Teams include your primary Cardiologist (physician) and Advanced Practice Providers (APPs -  Physician Assistants and Nurse Practitioners) who all work together to provide you with the care you need, when you need it.  Your next appointment:   6 month(s)  Provider:   Minus Breeding, MD     Other Instructions

## 2022-10-25 ENCOUNTER — Ambulatory Visit: Payer: Medicare Other | Admitting: Internal Medicine

## 2022-11-02 ENCOUNTER — Telehealth: Payer: Self-pay | Admitting: Cardiology

## 2022-11-02 NOTE — Telephone Encounter (Signed)
Pt's PCP is calling Jaz, CMA back in regards to Vit D supplement. Transferred.

## 2022-11-04 ENCOUNTER — Telehealth: Payer: Self-pay | Admitting: Internal Medicine

## 2022-11-04 NOTE — Telephone Encounter (Signed)
Patient states needs refill for Xanax. Pharmacy is [facility] [address] Patient phone number is [phone].

## 2022-11-05 NOTE — Telephone Encounter (Signed)
Not finding that drug store. Is it in [place]?

## 2022-11-07 MED ORDER — ALPRAZOLAM 1 MG PO TABS: 1.0000 mg | ORAL_TABLET | Freq: Four times a day (QID) | ORAL | 3 refills | Status: DC | PRN

## 2022-11-07 NOTE — Telephone Encounter (Signed)
Yes'  [id number] [place], [place] - [address] [phone]

## 2022-11-07 NOTE — Telephone Encounter (Signed)
Alprazolam refilled.  

## 2022-11-07 NOTE — Telephone Encounter (Signed)
Spoke with patient. Advised Xanax has been sent to pharmacy [facility]

## 2022-11-07 NOTE — Telephone Encounter (Signed)
Spoke with patient's PCP and we figured it all out.

## 2022-11-10 NOTE — Progress Notes (Signed)
Patient ID: Felicia Odom, female    DOB: [birth year], 67 y.o.   MRN: [id number]  HPI F former smoker followed for COPD, Allergic rhinitis, complicated by Thymoma/ resected, anxiety Office spirometry- 01/29/15 Normal spirometry. FVC 2.74/81%, FEV1 2.11/79%, FEV1/FVC 0.77, FEF 25-75 percent 1.79 Office Spirometry 10/21/2015-slight restriction of exhaled volume, mild obstruction. FVC 2.69/78%, FEV1 2.04/76%, FEV1/FVC 0.76, FEF 25-75 percent 1.68/65% PFT 04/27/2016-minimal obstructive airways disease, minimal diffusion defect, insignificant response to bronchodilator. FVC 2.79/78%, FEV1 2.11/76%, ratio 0.76, FEF 25-75 percent 1.81/72%, TLC 92%, DLCO 76%. .----------------------------------------------------------------------------------------------------   04/25/22-  67 year old female former smoker (36 pk yrs) followed for Asthma/Bronchitis/ COPD, allergic Rhinitis, complicated by Resection Thymoma/ TSGY, OCD/panic, Anxiety/ Depression, GERD, DM2, HTN, Eczema, Osteoarthritis, Hyperlipidemia, AFib/ Eliquis, dCHF, Aortic Atherosclerosis, -Neb albuterol , Ventolin hfa, Xanax 1 mg,  Covid vax- 4 Phizer Flu vax-declines 3 ED visits in last 2 months for AFib -- Hochrein/ Camnitz visits pending -----Pain in lower back. Cough Anxious, more than usual. Increased cough, thick yellow, no fever, asks antibiotic. Occ tussive twinge R lateral low back c/w musculoskeletal- limited to tylenol and heat due to AFib. Worried about electrolytes due to diuresis, without which had significant ankle edema. CXR 04/04/22 1V IMPRESSION: Bibasilar pulmonary opacities. Differential considerations include atelectasis, aspiration, or infection.  11/11/22-  67 year old female former smoker (36 pk yrs) followed for Asthma/Bronchitis/ COPD, allergic Rhinitis, complicated by Resection Thymoma/ TSGY, OCD/panic, Anxiety/ Depression, GERD, DM2, HTN, Eczema, Osteoarthritis, Hyperlipidemia, AFib/ Eliquis, dCHF, Aortic  Atherosclerosis, -Neb albuterol , Ventolin hfa, Xanax 1 mg, CT cardiacmorph/pulm vein for ablation 09/01/22- scarring, no acute -----Pt states she is doing okay. Some wheezing  Testing 123 testing 123 CT chest low dose screen 09/01/22-  IMPRESSION: 1. Lung-RADS 1, negative. Continue annual screening with low-dose chest CT without contrast in 12 months. 2. Coronary artery calcifications, aortic Atherosclerosis (ICD10-I70.0) and Emphysema (ICD10-J43.9). She has done well after cardiac ablation but complains that she still does not have energy/motivation to exercise more for weight loss.  Breathing has been well-controlled using her rescue inhaler maybe once daily.  She was asked about indications for using the nebulizer machine but has not felt she needed it.  Little cough or wheeze.  Review of Systems- see HPI + = positive Constitutional:   No-   weight loss, +night sweats, fevers, chills, fatigue, lassitude. HEENT:   No-  headaches, difficulty swallowing, tooth/dental problems, sore throat,       No-  sneezing, itching, ear ache, nasal congestion, post nasal drip,  CV:  + chest pain, No-orthopnea, PND, swelling in lower extremities, anasarca, dizziness, palpitations Resp: + shortness of breath with exertion or at rest.              +productive cough,   non-productive cough,  No- coughing up of blood.            +change in color of mucus.  No- wheezing.   Skin: No-   rash or lesions. GI:    heartburn, indigestion, No-abdominal pain, nausea, vomiting,  GU:  MS:  + joint pain or swelling.  . Neuro-     nothing unusual Psych: change in mood or affect. +Chronic depression , + anxiety.  No memory loss.   Objective:   Physical Exam General- Alert, Oriented , Distress- none acute, + Obese.  Skin- rash-none, lesions- none, excoriation- none Lymphadenopathy- none Head- + healed scar forehead            Eyes- Gross vision intact, PERRLA, conjunctivae clear secretions  Ears- Hearing,  canals-normal            Nose- No- rhinorrhea, no-Septal dev, polyps, erosion, perforation             Throat- Mallampati II , mucosa clear , drainage- none, tonsils- atrophic. +Missing teeth Neck- flexible , trachea midline, no stridor , thyroid nl, carotid no bruit Chest - symmetrical excursion , unlabored           Heart/CV- RRR ,  murmur+1S , no gallop, no rub, nl s1 s2                           - JVD- none , edema- none, stasis changes- none, varices- none           Lung- +clear,  wheeze-none, unlabored, cough-none, dullness-none, rub- none,            Chest wall-  +R VATS scars s/p thymectomy. No rub. Abd-  Br/ Gen/ Rectal- Not done, not indicated Extrem-  Neuro- grossly intact to observation

## 2022-11-11 ENCOUNTER — Encounter: Payer: Self-pay | Admitting: Internal Medicine

## 2022-11-11 ENCOUNTER — Ambulatory Visit (INDEPENDENT_AMBULATORY_CARE_PROVIDER_SITE_OTHER): Payer: 59 | Admitting: Internal Medicine

## 2022-11-11 VITALS — BP 122/64 | HR 56 | Ht 66.0 in | Wt 216.4 lb

## 2022-11-11 DIAGNOSIS — D4989 Neoplasm of unspecified behavior of other specified sites: Secondary | ICD-10-CM

## 2022-11-11 DIAGNOSIS — R911 Solitary pulmonary nodule: Secondary | ICD-10-CM | POA: Diagnosis not present

## 2022-11-11 DIAGNOSIS — J449 Chronic obstructive pulmonary disease, unspecified: Secondary | ICD-10-CM

## 2022-11-11 NOTE — Patient Instructions (Signed)
Glad you are doing well  Ok to continue your albuterol inhaler  You can use the nebulizer machine if you need extra help  We wil continue the low dose screening chest CT scan once a year  Please call if we can help you

## 2022-11-11 NOTE — Assessment & Plan Note (Signed)
Mild intermittent wheeze, uncomplicated. Doesn't seem to need maintenance controller currently.

## 2022-11-11 NOTE — Assessment & Plan Note (Signed)
No recurrence after thymectomy Screening CT clear

## 2022-11-11 NOTE — Assessment & Plan Note (Signed)
No nodule noted on current screening CT

## 2022-11-29 ENCOUNTER — Other Ambulatory Visit: Payer: Self-pay | Admitting: Student

## 2022-11-29 DIAGNOSIS — E2839 Other primary ovarian failure: Secondary | ICD-10-CM

## 2022-12-07 ENCOUNTER — Telehealth: Payer: Self-pay | Admitting: Internal Medicine

## 2022-12-07 NOTE — Telephone Encounter (Signed)
Patient called stating she is constipated. She also stated that last night 05/14 she was also vomiting. She is scheduled with Dr. Leone Payor on 07/25 but is requesting a call back to discuss if anything could be done prior. Please advise, thank you.

## 2022-12-07 NOTE — Telephone Encounter (Signed)
Pt stated that she has had constipation off and on for a year. Pt stated that she feels really constipated. Last BM yesterday. Pt was recommended to try Miralax twice a day until a large BM and then once a day. Drink lots of water. Exercise a moderate amount: Pt verbalized understanding with all questions answered.

## 2022-12-12 ENCOUNTER — Encounter: Payer: Self-pay | Admitting: Cardiology

## 2022-12-12 ENCOUNTER — Ambulatory Visit: Payer: 59 | Attending: Cardiology | Admitting: Cardiology

## 2022-12-12 VITALS — BP 124/70 | HR 61 | Ht 66.0 in | Wt 208.0 lb

## 2022-12-12 DIAGNOSIS — I10 Essential (primary) hypertension: Secondary | ICD-10-CM | POA: Diagnosis not present

## 2022-12-12 DIAGNOSIS — I480 Paroxysmal atrial fibrillation: Secondary | ICD-10-CM

## 2022-12-12 DIAGNOSIS — D6869 Other thrombophilia: Secondary | ICD-10-CM | POA: Diagnosis not present

## 2022-12-12 NOTE — Progress Notes (Signed)
Electrophysiology Office Note   Date:  12/12/2022   ID:  Felicia Odom, Felicia Odom [birth year], MRN [id number]  PCP:  Ellyn Hack, MD  Cardiologist:  Revankar Primary Electrophysiologist:  Kalyan Barabas Jorja Loa, MD    Chief Complaint: AF   History of Present Illness: Felicia Odom is a 67 y.o. female who is being seen today for the evaluation of AF at the request of Ellyn Hack, MD. Presenting today for electrophysiology evaluation.  She has a history significant for aortic atherosclerosis, hypokalemia, hypertension, hyperlipidemia, COPD, atrial fibrillation.  She had had multiple ER visits for atrial fibrillation.  She is now status post ablation 09/08/2022.  Today, denies symptoms of palpitations, chest pain, shortness of breath, orthopnea, PND, lower extremity edema, claudication, dizziness, presyncope, syncope, bleeding, or neurologic sequela. The patient is tolerating medications without difficulties.  She is feeling well.  She has no chest pain or shortness of breath.  Is able to all her daily activities without restriction.  She has noted no further episodes of atrial fibrillation since her ablation.   Past Medical History:  Diagnosis Date   Anxiety    Aortic atherosclerosis (HCC) 10/06/2021   Arthritis    neck   Cannabis dependence with current use (HCC) 07/26/2016   Chronic back pain    Chronic bronchitis (HCC)    Chronic diastolic CHF (congestive heart failure) (HCC)    Chronic neck pain    Cocaine abuse (HCC) 04/05/2021   COPD mixed type (HCC) 02/18/2019   Office Spirometry 10/21/2015-slight restriction of exhaled volume, mild obstruction. FVC 2.69/78%, FEV1 2.04/76%, FEV1/FVC 0.76, FEF 25-75 percent 1.68/65% PFT 04/27/2016-minimal obstructive airways disease, minimal diffusion defect, insignificant response to bronchodilator. FVC 2.79/78%, FEV1 2.11/76%, ratio 0.76, FEF 25-75 percent 1.81/72%, TLC 92%, DLCO 76%.   DDD (degenerative disc disease),  cervical 06/15/2015   Diverticulosis    Eczema 11/29/2017   Encounter for long-term opiate analgesic use 07/06/2017   Essential hypertension 09/25/2015   GERD (gastroesophageal reflux disease)    takes Nexium daily   Helicobacter pylori gastritis 08/2019   Tx and eradicated   Hyperlipidemia    not on any meds bc refused to pick up from pharmacy   Long-term current use of opiate analgesic 04/05/2021   Moderate episode of recurrent major depressive disorder (HCC) 01/28/2020   Obesity (BMI 30.0-34.9) 12/16/2021   Obsessive-compulsive disorder 06/08/2007   Qualifier: Diagnosis of  By: Jerolyn Shin     PAF (paroxysmal atrial fibrillation) (HCC) 12/16/2021   PANIC DISORDER 06/08/2007   Qualifier: Diagnosis of  By: Jerolyn Shin     Pneumonia    2016   PONV (postoperative nausea and vomiting)    Primary osteoarthritis involving multiple joints 01/21/2015   Seborrheic dermatitis of scalp 02/11/2015   Seizures (HCC)    in the 80's   Thyroid nodule 07/30/2020   IMPRESSION: No significant interval change of bilateral thyroid nodules.   The above is in keeping with the ACR TI-RADS recommendations - J Am Coll Radiol 2017;14:587-595.     Electronically Signed   By: Acquanetta Belling M.D.   On: 08/17/2020 14:18   Type II diabetes mellitus with manifestations (HCC) 02/18/2019   The 10-year ASCVD risk score Denman George DC Jr., et al., 2013) is: 9.1%   Values used to calculate the score:     Age: 66 years     Sex: Female     Is Non-Hispanic African American: No     Diabetic: Yes  Tobacco smoker: No     Systolic Blood Pressure: 138 mmHg     Is BP treated: No     HDL Cholesterol: 46.2 mg/dL     Total Cholesterol: 180 mg/dL   Past Surgical History:  Procedure Laterality Date   ATRIAL FIBRILLATION ABLATION N/A 09/08/2022   Procedure: ATRIAL FIBRILLATION ABLATION;  Surgeon: Regan Lemming, MD;  Location: [facility];  Service: Cardiovascular;  Laterality: N/A;   COLONOSCOPY     ECTOPIC PREGNANCY  SURGERY     x 2    ESOPHAGOGASTRODUODENOSCOPY     LAPAROSCOPIC LYSIS INTESTINAL ADHESIONS     x 2   RESECTION OF MEDIASTINAL MASS N/A 06/10/2016   Procedure: RESECTION OF MEDIASTINAL MASS;  Surgeon: Delight Ovens, MD;  Location: [facility] OR;  Service: Thoracic;  Laterality: N/A;   VIDEO ASSISTED THORACOSCOPY Right 06/10/2016   Procedure: VIDEO ASSISTED THORACOSCOPY WITH PLACEMENT OF ON Q TUNNELER;  Surgeon: Delight Ovens, MD;  Location: [facility] OR;  Service: Thoracic;  Laterality: Right;   VIDEO BRONCHOSCOPY N/A 06/10/2016   Procedure: VIDEO BRONCHOSCOPY;  Surgeon: Delight Ovens, MD;  Location: [facility];  Service: Thoracic;  Laterality: N/A;     Current Outpatient Medications  Medication Sig Dispense Refill   ACCU-CHEK GUIDE test strip USE 1 TO CHECK BLOOD SUGARS ONCE A WEEK     acetaminophen (TYLENOL) 500 MG tablet Take 1,000 mg by mouth every 6 (six) hours as needed for fever or headache.     albuterol (PROVENTIL) (2.5 MG/3ML) 0.083% nebulizer solution USE 1 VIAL IN NEBULIZER EVERY 6 HOURS - And As Needed (Patient taking differently: Take 2.5 mg by nebulization every 6 (six) hours as needed for shortness of breath.) 120 mL 11   albuterol (VENTOLIN HFA) 108 (90 Base) MCG/ACT inhaler INHALE 1 TO 2 PUFFS INTO THE LUNGS EVERY 4 HOURS AS NEEDED FOR WHEEZING OR SHORTNESS OF BREATH 18 each 12   ALPRAZolam (XANAX) 1 MG tablet Take 1 tablet (1 mg total) by mouth every 6 (six) hours as needed. for anxiety 120 tablet 3   apixaban (ELIQUIS) 5 MG TABS tablet Take 1 tablet (5 mg total) by mouth 2 (two) times daily. 180 tablet 3   diltiazem (CARDIZEM CD) 120 MG 24 hr capsule Take 1 capsule (120 mg total) by mouth daily. 90 capsule 3   diltiazem (CARDIZEM) 30 MG tablet TAKE 1 TABLET BY MOUTH EVERY 4 HOURS AS NEEDED (ELEVATED HEART RATE) 90 tablet 3   esomeprazole (NEXIUM) 20 MG capsule Take 20 mg by mouth daily as needed (acid reflux).     furosemide (LASIX) 40 MG tablet Take 1 tablet (40 mg total) by mouth  2 (two) times daily. 180 tablet 3   Multiple Vitamin (MULTIVITAMIN) tablet Take 1 tablet by mouth daily.     OZEMPIC, 0.25 OR 0.5 MG/DOSE, 2 MG/3ML SOPN Inject into the skin.     rosuvastatin (CRESTOR) 5 MG tablet Take 1 tablet (5 mg total) by mouth daily. 90 tablet 3   spironolactone (ALDACTONE) 25 MG tablet Take 0.5 tablets (12.5 mg total) by mouth daily. 45 tablet 3   valACYclovir (VALTREX) 1000 MG tablet See admin instructions.     Vitamin D, Ergocalciferol, (DRISDOL) 1.25 MG (50000 UNIT) CAPS capsule Take 50,000 Units by mouth every 7 (seven) days.     No current facility-administered medications for this visit.    Allergies:   Sulfa antibiotics   Social History:  The patient  reports that she quit smoking  about 7 years ago. Her smoking use included cigarettes. She has a 36.00 pack-year smoking history. She has never used smokeless tobacco. She reports that she does not drink alcohol and does not use drugs.   Family History:  The patient's family history includes Breast cancer in her sister; Clotting disorder in her mother; Colon cancer in her sister; Diabetes in her mother; Heart attack in her father; Heart failure in her father and mother; Kidney disease in her mother; Prostate cancer in her father.   ROS:  Please see the history of present illness.   Otherwise, review of systems is positive for none.   All other systems are reviewed and negative.   PHYSICAL EXAM: VS:  BP 124/70   Pulse 61   Ht 5\' 6"  (1.676 m)   Wt 208 lb (94.3 kg)   SpO2 96%   BMI 33.57 kg/m  , BMI Body mass index is 33.57 kg/m. GEN: Well nourished, well developed, in no acute distress  HEENT: normal  Neck: no JVD, carotid bruits, or masses Cardiac: RRR; no murmurs, rubs, or gallops,no edema  Respiratory:  clear to auscultation bilaterally, normal work of breathing GI: soft, nontender, nondistended, + BS MS: no deformity or atrophy  Skin: warm and dry Neuro:  Strength and sensation are intact Psych:  euthymic mood, full affect  EKG:  EKG is ordered today. Personal review of the ekg ordered shows sinus rhythm   Recent Labs: 04/04/2022: ALT 17; B Natriuretic Peptide 59.9 04/12/2022: Magnesium 2.0 08/25/2022: BUN 18; Creatinine, Ser 0.95; Hemoglobin 14.4; Platelets 269; Potassium 4.0; Sodium 143    Lipid Panel     Component Value Date/Time   CHOL 206 (H) 06/14/2021 1122   TRIG 206 (H) 06/14/2021 1122   HDL 46 06/14/2021 1122   CHOLHDL 4.5 (H) 06/14/2021 1122   CHOLHDL 4 10/28/2020 0827   VLDL 30.2 10/28/2020 0827   LDLCALC 124 (H) 06/14/2021 1122   LDLDIRECT 142.0 01/21/2015 1537     Wt Readings from Last 3 Encounters:  12/12/22 208 lb (94.3 kg)  11/11/22 216 lb 6.4 oz (98.2 kg)  10/06/22 209 lb (94.8 kg)      Other studies Reviewed: Additional studies/ records that were reviewed today include: TTE 8/29/*23  Review of the above records today demonstrates:   1. Left ventricular ejection fraction, by estimation, is 60 to 65%. The  left ventricle has normal function. The left ventricle has no regional  wall motion abnormalities. Left ventricular diastolic parameters are  consistent with Grade I diastolic  dysfunction (impaired relaxation).   2. Left atrial size was mildly dilated.   3. Aortic valve regurgitation is mild. Mild aortic valve stenosis.   Monitor 03/02/2022 personally reviewed Atrial Fibrillation/Flutter 1 event lasting 5 hours and 24 minutes occurred (3% burden), ranging from 87-178 bpm (avg of 135 bpm), the longest lasting 5 hours 24 mins with an avg rate of 135 bpm. Heart rate during atrial fibrillation exceeded 110 bpm 87% of the time.   Atrial Fibrillation/Flutter was detected within +/- 45 seconds of symptomatic patient event(s). Isolated SVEs were rare (<1.0%), SVE Couplets were rare (<1.0%), and SVE Triplets were rare (<1.0%).   ASSESSMENT AND PLAN:  1.  Paroxysmal atrial fibrillation: CHA2DS2-VASc of 4.  Currently on amiodarone and Eliquis. Is status  post ablation 09/08/2022.  She has not had any further episodes of atrial fibrillation.  Levonne Carreras stop amiodarone today.  She remains asymptomatic without A-fib, could potentially stop diltiazem at her next visit.  2.  Obesity: Lifestyle modification encouraged Body mass index is 33.57 kg/m.  3.  Hypertension: Currently well-controlled  4.  Secondary hypercoagulable state: Currently on Eliquis for atrial fibrillation   Current medicines are reviewed at length with the patient today.   The patient does not have concerns regarding her medicines.  The following changes were made today: Stop amiodarone  Labs/ tests ordered today include:  Orders Placed This Encounter  Procedures   EKG 12-Lead     Disposition:   FU 6 months  Signed, Mersades Barbaro Jorja Loa, MD  12/12/2022 2:35 PM     [facility] [address] [place] [address] [phone] (office) [phone] (fax)

## 2022-12-12 NOTE — Patient Instructions (Signed)
Medication Instructions:  Your physician has recommended you make the following change in your medication:  STOP Amiodarone  *If you need a refill on your cardiac medications before your next appointment, please call your pharmacy*   Lab Work: None ordered    Testing/Procedures: None ordered   Follow-Up: At [facility], you and your health needs are our priority.  As part of our continuing mission to provide you with exceptional heart care, we have created designated Provider Care Teams.  These Care Teams include your primary Cardiologist (physician) and Advanced Practice Providers (APPs -  Physician Assistants and Nurse Practitioners) who all work together to provide you with the care you need, when you need it.  Your next appointment:   6 month(s)  The format for your next appointment:   In Person  Provider:   You will see one of the following Advanced Practice Providers on your designated Care Team:   Renee Ursuy, PA-C Michael "Andy" Tillery, PA-C  Thank you for choosing [facility]!!   Qusay Villada, RN [phone]           

## 2022-12-26 ENCOUNTER — Ambulatory Visit: Payer: 59 | Attending: Cardiology | Admitting: Pharmacist Clinician (PhC)/ Clinical Pharmacy Specialist

## 2022-12-26 ENCOUNTER — Encounter: Payer: Self-pay | Admitting: Pharmacist Clinician (PhC)/ Clinical Pharmacy Specialist

## 2022-12-26 DIAGNOSIS — E785 Hyperlipidemia, unspecified: Secondary | ICD-10-CM

## 2022-12-26 MED ORDER — ROSUVASTATIN CALCIUM 5 MG PO TABS: 5.0000 mg | ORAL_TABLET | ORAL | 3 refills | Status: DC

## 2022-12-26 NOTE — Patient Instructions (Addendum)
We will reach out to the [facility] Mail order pharmacy to get all your medications transferred.  You should hear from someone there to get everything coordinated Sherlynn Stalls).   After you get the medications, please get rid of the bottles that are in the pill packs, to avoid duplication of medications.    If you have any further questions or concerns, you can also reach out to me via MyChart:  Phillips Hay PharmD  Thank you for choosing [facility]

## 2022-12-26 NOTE — Progress Notes (Signed)
Office Visit    Patient Name: Felicia Odom Date of Encounter: 12/26/2022  Primary Care Provider:  Ellyn Hack, MD Primary Cardiologist:  Rollene Rotunda, MD  Chief Complaint    Hypertension  Significant Past Medical History   AFib Post ablation (09/08/22), now off amiodarone, CHADS2-VASc =4 on Eliquis  HLD 8/23 LDL 69; on rosuvastatin 5  CHF Chronic diastolic; on furosemide, spironolactone  DM2 8/23 A1c 6.6, on Ozempic  GERD On ezomperazole    Allergies  Allergen Reactions   Sulfa Antibiotics Hives and Swelling    History of Present Illness    Felicia Odom is a 67 y.o. female patient of Dr Antoine Poche, who is in the office today to discuss coordination of medications.  She has some anxiety in relation to her medications and has a > 6 month supply of several medications.  She would prefer not to take meds, but understands that she will need to be on several medications for the foreseeable future.    She was recently diagnosed with DM2 and started on Ozempic.  She feels that it has made an improvement and is down about 5-6 pounds after just 3 weeks of use.    Today she is in the office with her son, to review all medications and get set up with adherence packaging.  Would like to get her PRN medications from [facility] as well, to have everything in one service.  Currently she is using [employer] [facility], and has been picking things up as they text to tell her a refill is ready.  Because of this, she has an excess of some medications.    Wants to start the med envelopes from [facility] mail order  Prn meds:  alprazolam, diltiazem 30 - has lots at home; nexium (about twice weekly), valcyclovir, ProAir (but not nebulizers),  Maintenance meds: eliquis, diltiazem xr, furosemide (6am, 6 pm), centrum silver, rosuvastatin (qod)  Ozempic - down 6 pounds in 3 weeks (on 0.25 mg dose first)    Accessory Clinical Findings    Lab Results  Component Value Date   CREATININE 0.95  08/25/2022   BUN 18 08/25/2022   NA 143 08/25/2022   K 4.0 08/25/2022   CL 101 08/25/2022   CO2 28 08/25/2022   Lab Results  Component Value Date   ALT 17 04/04/2022   AST 19 04/04/2022   ALKPHOS 72 04/04/2022   BILITOT 0.7 04/04/2022   Lab Results  Component Value Date   HGBA1C 5.8 03/24/2021    Home Medications    Current Outpatient Medications  Medication Sig Dispense Refill   ACCU-CHEK GUIDE test strip USE 1 TO CHECK BLOOD SUGARS ONCE A WEEK     acetaminophen (TYLENOL) 500 MG tablet Take 1,000 mg by mouth every 6 (six) hours as needed for fever or headache.     albuterol (PROVENTIL) (2.5 MG/3ML) 0.083% nebulizer solution USE 1 VIAL IN NEBULIZER EVERY 6 HOURS - And As Needed (Patient taking differently: Take 2.5 mg by nebulization every 6 (six) hours as needed for shortness of breath.) 120 mL 11   albuterol (VENTOLIN HFA) 108 (90 Base) MCG/ACT inhaler INHALE 1 TO 2 PUFFS INTO THE LUNGS EVERY 4 HOURS AS NEEDED FOR WHEEZING OR SHORTNESS OF BREATH 18 each 12   ALPRAZolam (XANAX) 1 MG tablet Take 1 tablet (1 mg total) by mouth every 6 (six) hours as needed. for anxiety 120 tablet 3   apixaban (ELIQUIS) 5 MG TABS tablet Take 1 tablet (5 mg  total) by mouth 2 (two) times daily. 180 tablet 3   diltiazem (CARDIZEM CD) 120 MG 24 hr capsule Take 1 capsule (120 mg total) by mouth daily. 90 capsule 3   diltiazem (CARDIZEM) 30 MG tablet TAKE 1 TABLET BY MOUTH EVERY 4 HOURS AS NEEDED (ELEVATED HEART RATE) 90 tablet 3   esomeprazole (NEXIUM) 20 MG capsule Take 20 mg by mouth daily as needed (acid reflux).     furosemide (LASIX) 40 MG tablet Take 1 tablet (40 mg total) by mouth 2 (two) times daily. 180 tablet 3   Multiple Vitamin (MULTIVITAMIN) tablet Take 1 tablet by mouth daily.     OZEMPIC, 0.25 OR 0.5 MG/DOSE, 2 MG/3ML SOPN Inject into the skin.     rosuvastatin (CRESTOR) 5 MG tablet Take 1 tablet (5 mg total) by mouth daily. 90 tablet 3   spironolactone (ALDACTONE) 25 MG tablet Take 0.5  tablets (12.5 mg total) by mouth daily. 45 tablet 3   valACYclovir (VALTREX) 1000 MG tablet See admin instructions.     Vitamin D, Ergocalciferol, (DRISDOL) 1.25 MG (50000 UNIT) CAPS capsule Take 50,000 Units by mouth every 7 (seven) days.     No current facility-administered medications for this visit.     Assessment & Plan    Hyperlipidemia with target LDL less than 100 Patient with hyperlipidemia, LDL at 69 last summer.  Currently on rosuvastatin 5 mg daily, but notes MD told her would be safe to take every other day.  She would prefer that, as she doesn't like taking meds.  Her LDL goal is < 100, as there is no known ASCVD (Calcium score =0).    Medication management Reviewed all medications in office today.  PRN meds:  alprazolam, diltiazem 30 mg (has lots at home); nexium (about twice weekly), valcyclovir, ProAir (but not nebulizers),   Maintenance meds: Eliquis 5 mg bid, diltiazem cd 120 mg qd, furosemide 40 mg bid (6am, 6 pm), centrum silver qd, rosuvastatin 5 mg qod.  Have messaged [facility] to reach out to patient to start coordination of adherence packaging, as well as transferring PRN medications for future needs.     Phillips Hay PharmD CPP [facility]  [address] [place], [place] [address] [phone]

## 2022-12-26 NOTE — Assessment & Plan Note (Signed)
Patient with hyperlipidemia, LDL at 69 last summer.  Currently on rosuvastatin 5 mg daily, but notes MD told her would be safe to take every other day.  She would prefer that, as she doesn't like taking meds.  Her LDL goal is < 100, as there is no known ASCVD (Calcium score =0).

## 2022-12-27 ENCOUNTER — Other Ambulatory Visit: Payer: Self-pay

## 2023-01-30 ENCOUNTER — Telehealth: Payer: Self-pay | Admitting: Cardiology

## 2023-01-30 NOTE — Telephone Encounter (Signed)
Pt would like a callback from the pharmacist to discuss the medications being sent over to a new pharmacy and questions regarding the medications. Please advise.

## 2023-02-02 NOTE — Telephone Encounter (Signed)
Spoke with patient - she was asking when her prescriptions would start to arrive from [facility] in med envelopes.   She still believes she has at least a month supply of most meds at home.  Explained that we need to have her use up some of that before insurance can be billed again.  Sent message to [facility] for getting her set up.

## 2023-02-10 ENCOUNTER — Other Ambulatory Visit: Payer: Self-pay | Admitting: Cardiology

## 2023-02-16 ENCOUNTER — Encounter: Payer: Self-pay | Admitting: Internal Medicine

## 2023-02-16 ENCOUNTER — Telehealth: Payer: Self-pay

## 2023-02-16 ENCOUNTER — Ambulatory Visit (INDEPENDENT_AMBULATORY_CARE_PROVIDER_SITE_OTHER): Payer: 59 | Admitting: Internal Medicine

## 2023-02-16 VITALS — BP 102/70 | HR 97 | Ht 66.0 in | Wt 200.0 lb

## 2023-02-16 DIAGNOSIS — R1010 Upper abdominal pain, unspecified: Secondary | ICD-10-CM | POA: Diagnosis not present

## 2023-02-16 DIAGNOSIS — E8881 Metabolic syndrome: Secondary | ICD-10-CM | POA: Diagnosis not present

## 2023-02-16 DIAGNOSIS — Z86010 Personal history of colonic polyps: Secondary | ICD-10-CM | POA: Diagnosis not present

## 2023-02-16 DIAGNOSIS — Z7901 Long term (current) use of anticoagulants: Secondary | ICD-10-CM

## 2023-02-16 DIAGNOSIS — E669 Obesity, unspecified: Secondary | ICD-10-CM

## 2023-02-16 DIAGNOSIS — I480 Paroxysmal atrial fibrillation: Secondary | ICD-10-CM

## 2023-02-16 NOTE — Telephone Encounter (Signed)
Pharmacy please advise on holding Eliquis prior to colonoscopy scheduled for 04/13/2023. Thank you.

## 2023-02-16 NOTE — Patient Instructions (Addendum)
You have been scheduled for a colonoscopy. Please follow written instructions given to you at your visit today.   You will be contaced by our office prior to your procedure for directions on holding your Eliquis.  If you do not hear from our office 1 week prior to your scheduled procedure, please call [phone] to discuss.   Please pick up your prep supplies at the pharmacy within the next 1-3 days.  If you use inhalers (even only as needed), please bring them with you on the day of your procedure.  DO NOT TAKE 7 DAYS PRIOR TO TEST- Trulicity (dulaglutide) Ozempic, Wegovy (semaglutide) Mounjaro (tirzepatide) Bydureon Bcise (exanatide extended release)  DO NOT TAKE 1 DAY PRIOR TO YOUR TEST Rybelsus (semaglutide) Adlyxin (lixisenatide) Victoza (liraglutide) Byetta (exanatide) ___________________________________________________________________________   I appreciate the opportunity to care for you. Stan Head, MD, [facility]

## 2023-02-16 NOTE — Telephone Encounter (Signed)
[facility] Pre-operative Risk Assessment     Request for surgical clearance:     Endoscopy Procedure  What type of surgery is being performed?     colonoscopy  When is this surgery scheduled?     04/13/2023  What type of clearance is required ?   Pharmacy  Are there any medications that need to be held prior to surgery and how long? Eliquis, 2 days  Practice name and name of physician performing surgery?      [facility]  What is your office phone and fax number?      Phone- [phone]  Fax- [phone]  Anesthesia type (None, local, MAC, general) ?       MAC

## 2023-02-16 NOTE — Telephone Encounter (Signed)
Patient with diagnosis of atrial fibrillation on Eliquis for anticoagulation.    What type of surgery is being performed?     colonoscopy  When is this surgery scheduled?     04/13/2023    CHA2DS2-VASc Score = 6   This indicates a 9.7% annual risk of stroke. The patient's score is based upon: CHF History: 1 HTN History: 1 Diabetes History: 1 Stroke History: 0 Vascular Disease History: 1 Age Score: 1 Gender Score: 1    CrCl 83 Platelet count 269  Per office protocol, patient can hold Eliquis for 2 days prior to procedure.   Patient will not need bridging with Lovenox (enoxaparin) around procedure.  **This guidance is not considered finalized until pre-operative APP has relayed final recommendations.**

## 2023-02-16 NOTE — Telephone Encounter (Signed)
Preop phone visit now scheduled for 03/01/2023 at 9:40 AM . Meds rec and consent done.

## 2023-02-16 NOTE — Telephone Encounter (Signed)
  Patient Consent for Virtual Visit        Felicia Odom has provided verbal consent on 02/16/2023 for a virtual visit (video or telephone).   CONSENT FOR VIRTUAL VISIT FOR:  Felicia Odom  By participating in this virtual visit I agree to the following:  I hereby voluntarily request, consent and authorize [facility] and its employed or contracted physicians, physician assistants, nurse practitioners or other licensed health care professionals (the Practitioner), to provide me with telemedicine health care services (the "Services") as deemed necessary by the treating Practitioner. I acknowledge and consent to receive the Services by the Practitioner via telemedicine. I understand that the telemedicine visit will involve communicating with the Practitioner through live audiovisual communication technology and the disclosure of certain medical information by electronic transmission. I acknowledge that I have been given the opportunity to request an in-person assessment or other available alternative prior to the telemedicine visit and am voluntarily participating in the telemedicine visit.  I understand that I have the right to withhold or withdraw my consent to the use of telemedicine in the course of my care at any time, without affecting my right to future care or treatment, and that the Practitioner or I may terminate the telemedicine visit at any time. I understand that I have the right to inspect all information obtained and/or recorded in the course of the telemedicine visit and may receive copies of available information for a reasonable fee.  I understand that some of the potential risks of receiving the Services via telemedicine include:  Delay or interruption in medical evaluation due to technological equipment failure or disruption; Information transmitted may not be sufficient (e.g. poor resolution of images) to allow for appropriate medical decision making by the  Practitioner; and/or  In rare instances, security protocols could fail, causing a breach of personal health information.  Furthermore, I acknowledge that it is my responsibility to provide information about my medical history, conditions and care that is complete and accurate to the best of my ability. I acknowledge that Practitioner's advice, recommendations, and/or decision may be based on factors not within their control, such as incomplete or inaccurate data provided by me or distortions of diagnostic images or specimens that may result from electronic transmissions. I understand that the practice of medicine is not an exact science and that Practitioner makes no warranties or guarantees regarding treatment outcomes. I acknowledge that a copy of this consent can be made available to me via my patient portal [facility] MyChart), or I can request a printed copy by calling the office of [facility].    I understand that my insurance will be billed for this visit.   I have read or had this consent read to me. I understand the contents of this consent, which adequately explains the benefits and risks of the Services being provided via telemedicine.  I have been provided ample opportunity to ask questions regarding this consent and the Services and have had my questions answered to my satisfaction. I give my informed consent for the services to be provided through the use of telemedicine in my medical care

## 2023-02-16 NOTE — Telephone Encounter (Signed)
   Name: Felicia Odom  DOB: 1955/10/26  MRN: [id number]  Primary Cardiologist: Rollene Rotunda, MD   Preoperative team, please contact this patient and set up a phone call appointment for further preoperative risk assessment. Please obtain consent and complete medication review. Thank you for your help.  I confirm that guidance regarding antiplatelet and oral anticoagulation therapy has been completed and, if necessary, noted below.  Per Pharmacy:   Per office protocol, patient can hold Eliquis for 2 days prior to procedure.   Patient will not need bridging with Lovenox (enoxaparin) around procedure.   Joni Reining, NP 02/16/2023, 4:59 PM [facility]

## 2023-02-16 NOTE — Progress Notes (Signed)
Felicia Odom 67 y.o. 08-Aug-1955 [id number]  Assessment & Plan:   Encounter Diagnoses  Name Primary?   Hx of colonic polyps Yes   PAF (paroxysmal atrial fibrillation) (HCC)    Upper abdominal pain    Abdominal obesity and metabolic syndrome    Long term current use of anticoagulant    Schedule colonoscopy.  Will need to hold Eliquis x 2 days prior.  She is aware of the rare but real increased risk of stroke off Eliquis.  Other endoscopy risks reviewed with the patient as well.  She has no questions and agrees to schedule.  She has currently been off Ozempic so that may not be an issue but she was made aware to hold this a week before a colonoscopy and sedation.  She is encouraged to continue to reduce carbohydrates to try to lose weight and improve her metabolic syndrome.    Subjective:   Chief Complaint: History of colon polyp on Eliquis  HPI 67 year old white woman with a history of type 2 diabetes mellitus, metabolic syndrome, H. pylori gastritis, obesity, COPD and substance abuse issues who has a history of colon polyp removal with a 10 mm adenoma removed in February 2021.  She is due for surveillance.  She takes Eliquis with a history of A-fib she is status post atrial fibrillation ablation earlier this year.  Things are going well with that.  She is reducing carbohydrates by try to eliminate bread she does not drink sodas and she has been losing weight though she had been on Ozempic she stopped that recently because it constipated her.  She has also had some right lower quadrant pain issues she was seen in our clinic in late 2022, she has been to gynecology.  Unrevealing.  She gets some intermittent sharp bilateral upper abdominal pain from time to time as well.  No clear precipitant and it is not disabling.   Lung cancer screening chest CT 09/01/2022 negative. Wt Readings from Last 3 Encounters:  02/16/23 200 lb (90.7 kg)  12/12/22 208 lb (94.3 kg)  11/11/22 216  lb 6.4 oz (98.2 kg)     09/19/19 Colonoscopy - One 10 mm polyp in the transverse colon, removed with a cold snare and removed piecemeal using a hot snare. Resected and retrieved. - Diverticulosis in the sigmoid colon. - The examined portion of the ileum was normal. - The examination was otherwise normal on direct and retroflexion views. EGD   - No endoscopic esophageal abnormality to explain                            patient's dysphagia. Esophagus dilated. Dilated.                           - Erosive gastropathy with no stigmata of recent                            bleeding. Biopsied.                           - Duodenitis. Biopsied.                           - The examination was otherwise normal. 1. Surgical [P], gastric antrum - ANTRAL MUCOSA WITH MILDLY ACTIVE CHRONIC HELICOBACTER PYLORI GASTRITIS  AND FOCAL INTESTINAL METAPLASIA. - WARTHIN-STARRY POSITIVE FOR HELICOBACTER PYLORI. - NO DYSPLASIA OR CARCINOMA. 2. Surgical [P], duodenal bulb - DUODENAL MUCOSA WITH BRUNNER GLAND HYPERPLASIA AND CHANGES CONSISTENT WITH PEPTIC INJURY. - NO FEATURES OF SPRUE, DYSPLASIA OR CARCINOMA. P 3. Surgical [P], random sites - COLONIC MUCOSA WITH NO SIGNIFICANT HISTOPATHOLOGIC CHANGES. - NO MICROSCOPIC COLITIS, ACTIVE INFLAMMATION OR CHRONIC CHANGES. 4. Surgical [P], colon, transverse, polyp - SESSILE SERRATED POLYP(S) WITHOUT CYTOLOGIC DYSPLASIA. - NO EVIDENCE OF CARCINOMA.  Allergies  Allergen Reactions   Sulfa Antibiotics Hives and Swelling   Current Meds  Medication Sig   ACCU-CHEK GUIDE test strip USE 1 TO CHECK BLOOD SUGARS ONCE A WEEK   acetaminophen (TYLENOL) 500 MG tablet Take 1,000 mg by mouth every 6 (six) hours as needed for fever or headache.   albuterol (PROVENTIL) (2.5 MG/3ML) 0.083% nebulizer solution USE 1 VIAL IN NEBULIZER EVERY 6 HOURS - And As Needed (Patient taking differently: Take 2.5 mg by nebulization every 6 (six) hours as needed for shortness of breath.)   albuterol  (VENTOLIN HFA) 108 (90 Base) MCG/ACT inhaler INHALE 1 TO 2 PUFFS INTO THE LUNGS EVERY 4 HOURS AS NEEDED FOR WHEEZING OR SHORTNESS OF BREATH   ALPRAZolam (XANAX) 1 MG tablet Take 1 tablet (1 mg total) by mouth every 6 (six) hours as needed. for anxiety   apixaban (ELIQUIS) 5 MG TABS tablet Take 1 tablet (5 mg total) by mouth 2 (two) times daily.   diltiazem (CARDIZEM CD) 120 MG 24 hr capsule Take 1 capsule (120 mg total) by mouth daily.   diltiazem (CARDIZEM) 30 MG tablet TAKE 1 TABLET BY MOUTH EVERY 4 HOURS AS NEEDED (ELEVATED HEART RATE)   esomeprazole (NEXIUM) 20 MG capsule Take 20 mg by mouth daily as needed (acid reflux).   furosemide (LASIX) 40 MG tablet Take 1 tablet by mouth twice daily   Multiple Vitamin (MULTIVITAMIN) tablet Take 1 tablet by mouth daily.   OZEMPIC, 0.25 OR 0.5 MG/DOSE, 2 MG/3ML SOPN Inject into the skin.   pravastatin (PRAVACHOL) 10 MG tablet Take 10 mg by mouth daily.   rosuvastatin (CRESTOR) 5 MG tablet Take 1 tablet (5 mg total) by mouth every other day.   spironolactone (ALDACTONE) 25 MG tablet Take 0.5 tablets (12.5 mg total) by mouth daily.   valACYclovir (VALTREX) 1000 MG tablet See admin instructions.   Vitamin D, Ergocalciferol, (DRISDOL) 1.25 MG (50000 UNIT) CAPS capsule Take 50,000 Units by mouth every 7 (seven) days.   Past Medical History:  Diagnosis Date   Anxiety    Aortic atherosclerosis (HCC) 10/06/2021   Arthritis    neck   Cannabis dependence with current use (HCC) 07/26/2016   Chronic back pain    Chronic bronchitis (HCC)    Chronic diastolic CHF (congestive heart failure) (HCC)    Chronic neck pain    Cocaine abuse (HCC) 04/05/2021   COPD mixed type (HCC) 02/18/2019   Office Spirometry 10/21/2015-slight restriction of exhaled volume, mild obstruction. FVC 2.69/78%, FEV1 2.04/76%, FEV1/FVC 0.76, FEF 25-75 percent 1.68/65% PFT 04/27/2016-minimal obstructive airways disease, minimal diffusion defect, insignificant response to bronchodilator. FVC  2.79/78%, FEV1 2.11/76%, ratio 0.76, FEF 25-75 percent 1.81/72%, TLC 92%, DLCO 76%.   DDD (degenerative disc disease), cervical 06/15/2015   Diverticulosis    Eczema 11/29/2017   Encounter for long-term opiate analgesic use 07/06/2017   Essential hypertension 09/25/2015   GERD (gastroesophageal reflux disease)    takes Nexium daily   Helicobacter pylori gastritis 08/2019   Tx and eradicated  Hyperlipidemia    not on any meds bc refused to pick up from pharmacy   Long-term current use of opiate analgesic 04/05/2021   Moderate episode of recurrent major depressive disorder (HCC) 01/28/2020   Obesity (BMI 30.0-34.9) 12/16/2021   Obsessive-compulsive disorder 06/08/2007   Qualifier: Diagnosis of  By: Jerolyn Shin     PAF (paroxysmal atrial fibrillation) (HCC) 12/16/2021   PANIC DISORDER 06/08/2007   Qualifier: Diagnosis of  By: Jerolyn Shin     Pneumonia    2016   PONV (postoperative nausea and vomiting)    Primary osteoarthritis involving multiple joints 01/21/2015   Seborrheic dermatitis of scalp 02/11/2015   Seizures (HCC)    in the 80's   Thyroid nodule 07/30/2020   IMPRESSION: No significant interval change of bilateral thyroid nodules.   The above is in keeping with the ACR TI-RADS recommendations - J Am Coll Radiol 2017;14:587-595.     Electronically Signed   By: Acquanetta Belling M.D.   On: 08/17/2020 14:18   Type II diabetes mellitus with manifestations (HCC) 02/18/2019   The 10-year ASCVD risk score Denman George DC Montez Hageman., et al., 2013) is: 9.1%   Values used to calculate the score:     Age: 66 years     Sex: Female     Is Non-Hispanic African American: No     Diabetic: Yes     Tobacco smoker: No     Systolic Blood Pressure: 138 mmHg     Is BP treated: No     HDL Cholesterol: 46.2 mg/dL     Total Cholesterol: 180 mg/dL   Past Surgical History:  Procedure Laterality Date   ATRIAL FIBRILLATION ABLATION N/A 09/08/2022   Procedure: ATRIAL FIBRILLATION ABLATION;  Surgeon: Regan Lemming,  MD;  Location: [facility]IVE [facility];  Service: Cardiovascular;  Laterality: N/A;   COLONOSCOPY     ECTOPIC PREGNANCY SURGERY     x 2    ESOPHAGOGASTRODUODENOSCOPY     LAPAROSCOPIC LYSIS INTESTINAL ADHESIONS     x 2   RESECTION OF MEDIASTINAL MASS N/A 06/10/2016   Procedure: RESECTION OF MEDIASTINAL MASS;  Surgeon: Delight Ovens, MD;  Location: [facility] OR;  Service: Thoracic;  Laterality: N/A;   VIDEO ASSISTED THORACOSCOPY Right 06/10/2016   Procedure: VIDEO ASSISTED THORACOSCOPY WITH PLACEMENT OF ON Q TUNNELER;  Surgeon: Delight Ovens, MD;  Location: [facility] OR;  Service: Thoracic;  Laterality: Right;   VIDEO BRONCHOSCOPY N/A 06/10/2016   Procedure: VIDEO BRONCHOSCOPY;  Surgeon: Delight Ovens, MD;  Location: [facility] OR;  Service: Thoracic;  Laterality: N/A;   Social History   Social History Narrative   Retired [employer]   Divorced   1 son   Former smoker   2 coffee/daty   No EtOH, no drugs   family history includes Breast cancer in her sister; Clotting disorder in her mother; Colon cancer in her sister; Diabetes in her mother; Heart attack in her father; Heart failure in her father and mother; Kidney disease in her mother; Prostate cancer in her father.   Review of Systems As per HPI  Objective:   Physical Exam @BP  102/70   Pulse 97   Ht 5\' 6"  (1.676 m)   Wt 200 lb (90.7 kg)   BMI 32.28 kg/m @  General:  NAD Eyes:   anicteric Lungs:  clear Heart::  S1S2 no rubs, murmurs or gallops Abdomen:  soft and nontender, BS+ Ext:   no edema, cyanosis or clubbing  Data Reviewed:  See HPI I have reviewed recent cardiology notes

## 2023-02-17 ENCOUNTER — Other Ambulatory Visit: Payer: Self-pay | Admitting: Cardiology

## 2023-02-17 DIAGNOSIS — I480 Paroxysmal atrial fibrillation: Secondary | ICD-10-CM

## 2023-02-17 NOTE — Telephone Encounter (Signed)
Prescription refill request for Eliquis received. Indication:afib Last office visit:6/24 Scr:0.95  2/24 Age: 67 Weight:90.7  kg  Prescription refilled

## 2023-02-21 ENCOUNTER — Telehealth: Payer: Self-pay | Admitting: Internal Medicine

## 2023-02-21 NOTE — Telephone Encounter (Signed)
Patient states needs refill for Alprazolam. Pharmacy is [facility] [address]. Patient phone number is [phone].

## 2023-02-22 MED ORDER — ALPRAZOLAM 1 MG PO TABS: 1.0000 mg | ORAL_TABLET | Freq: Four times a day (QID) | ORAL | 3 refills | Status: DC | PRN

## 2023-02-22 NOTE — Telephone Encounter (Signed)
Patient is requesting refill on Xanax 1mg  Please send to [facility] [address]

## 2023-02-22 NOTE — Telephone Encounter (Signed)
Spoke with patient. Advised Xanax prescription has been sent to pharmacy. Closing encounter. NFN

## 2023-02-22 NOTE — Telephone Encounter (Signed)
Xanax refilled.  

## 2023-02-26 NOTE — Progress Notes (Unsigned)
Virtual Visit via Telephone Note   Because of Felicia Odom's co-morbid illnesses, she is at least at moderate risk for complications without adequate follow up.  This format is felt to be most appropriate for this patient at this time.  The patient did not have access to video technology/had technical difficulties with video requiring transitioning to audio format only (telephone).  All issues noted in this document were discussed and addressed.  No physical exam could be performed with this format.  Please refer to the patient's chart for her consent to telehealth for [facility].  Evaluation Performed:  Preoperative cardiovascular risk assessment _____________   Date:  03/01/2023   Patient ID:  Felicia Odom, Felicia Odom 1956-05-24, MRN [id number] Patient Location:  Home Provider location:   Office  Primary Care Provider:  Ellyn Hack, MD Primary Cardiologist:  Rollene Rotunda, MD  Chief Complaint / Patient Profile   67 y.o. y/o female with a h/o atrial fibrillation,CHA2DS2-VASc Score 6, status post ablation 09/08/2022, on Eliquis, aortic atherosclerosis, hypokalemia, hypertension, hyperlipidemia and COPD who is pending colonoscopy on 04/13/2023, through [facility] gastroenterology.  She presents today for telephonic preoperative cardiovascular risk assessment.  History of Present Illness    Felicia Odom is a 67 y.o. female who presents via audio/video conferencing for a telehealth visit today.  Pt was last seen in cardiology clinic on 12/12/2022 by Dr. Elberta Fortis.  At that time Felicia Odom was doing well she was taken off of amiodarone and continued on Eliquis.  The patient is now pending procedure as outlined above. Since her last visit, she   Past Medical History    Past Medical History:  Diagnosis Date   Anxiety    Aortic atherosclerosis (HCC) 10/06/2021   Arthritis    neck   Cannabis dependence with current use (HCC) 07/26/2016   Chronic back  pain    Chronic bronchitis (HCC)    Chronic diastolic CHF (congestive heart failure) (HCC)    Chronic neck pain    Cocaine abuse (HCC) 04/05/2021   COPD mixed type (HCC) 02/18/2019   Office Spirometry 10/21/2015-slight restriction of exhaled volume, mild obstruction. FVC 2.69/78%, FEV1 2.04/76%, FEV1/FVC 0.76, FEF 25-75 percent 1.68/65% PFT 04/27/2016-minimal obstructive airways disease, minimal diffusion defect, insignificant response to bronchodilator. FVC 2.79/78%, FEV1 2.11/76%, ratio 0.76, FEF 25-75 percent 1.81/72%, TLC 92%, DLCO 76%.   DDD (degenerative disc disease), cervical 06/15/2015   Diverticulosis    Eczema 11/29/2017   Encounter for long-term opiate analgesic use 07/06/2017   Essential hypertension 09/25/2015   GERD (gastroesophageal reflux disease)    takes Nexium daily   Helicobacter pylori gastritis 08/2019   Tx and eradicated   Hyperlipidemia    not on any meds bc refused to pick up from pharmacy   Long-term current use of opiate analgesic 04/05/2021   Moderate episode of recurrent major depressive disorder (HCC) 01/28/2020   Obesity (BMI 30.0-34.9) 12/16/2021   Obsessive-compulsive disorder 06/08/2007   Qualifier: Diagnosis of  By: Jerolyn Shin     PAF (paroxysmal atrial fibrillation) (HCC) 12/16/2021   PANIC DISORDER 06/08/2007   Qualifier: Diagnosis of  By: Jerolyn Shin     Pneumonia    2016   PONV (postoperative nausea and vomiting)    Primary osteoarthritis involving multiple joints 01/21/2015   Seborrheic dermatitis of scalp 02/11/2015   Seizures (HCC)    in the 80's   Thyroid nodule 07/30/2020   IMPRESSION: No significant interval change of bilateral thyroid nodules.  The above is in keeping with the ACR TI-RADS recommendations - J Am Coll Radiol 2017;14:587-595.     Electronically Signed   By: Acquanetta Belling M.D.   On: 08/17/2020 14:18   Type II diabetes mellitus with manifestations (HCC) 02/18/2019   The 10-year ASCVD risk score Denman George DC Montez Hageman., et al.,  2013) is: 9.1%   Values used to calculate the score:     Age: 67 years     Sex: Female     Is Non-Hispanic African American: No     Diabetic: Yes     Tobacco smoker: No     Systolic Blood Pressure: 138 mmHg     Is BP treated: No     HDL Cholesterol: 46.2 mg/dL     Total Cholesterol: 180 mg/dL   Past Surgical History:  Procedure Laterality Date   ATRIAL FIBRILLATION ABLATION N/A 09/08/2022   Procedure: ATRIAL FIBRILLATION ABLATION;  Surgeon: Regan Lemming, MD;  Location: [facility];  Service: Cardiovascular;  Laterality: N/A;   COLONOSCOPY     ECTOPIC PREGNANCY SURGERY     x 2    ESOPHAGOGASTRODUODENOSCOPY     LAPAROSCOPIC LYSIS INTESTINAL ADHESIONS     x 2   RESECTION OF MEDIASTINAL MASS N/A 06/10/2016   Procedure: RESECTION OF MEDIASTINAL MASS;  Surgeon: Delight Ovens, MD;  Location: [facility] OR;  Service: Thoracic;  Laterality: N/A;   VIDEO ASSISTED THORACOSCOPY Right 06/10/2016   Procedure: VIDEO ASSISTED THORACOSCOPY WITH PLACEMENT OF ON Q TUNNELER;  Surgeon: Delight Ovens, MD;  Location: [facility] OR;  Service: Thoracic;  Laterality: Right;   VIDEO BRONCHOSCOPY N/A 06/10/2016   Procedure: VIDEO BRONCHOSCOPY;  Surgeon: Delight Ovens, MD;  Location: [facility] OR;  Service: Thoracic;  Laterality: N/A;    Allergies  Allergies  Allergen Reactions   Sulfa Antibiotics Hives and Swelling    Home Medications    Prior to Admission medications   Medication Sig Start Date End Date Taking? Authorizing Provider  ACCU-CHEK GUIDE test strip USE 1 TO CHECK BLOOD SUGARS ONCE A WEEK 11/25/22   [provider]  acetaminophen (TYLENOL) 500 MG tablet Take 1,000 mg by mouth every 6 (six) hours as needed for fever or headache.    [provider]  albuterol (PROVENTIL) (2.5 MG/3ML) 0.083% nebulizer solution USE 1 VIAL IN NEBULIZER EVERY 6 HOURS - And As Needed Patient taking differently: Take 2.5 mg by nebulization every 6 (six) hours as needed for shortness of breath. 06/21/22    Jetty Duhamel D, MD  albuterol (VENTOLIN HFA) 108 (90 Base) MCG/ACT inhaler INHALE 1 TO 2 PUFFS INTO THE LUNGS EVERY 4 HOURS AS NEEDED FOR WHEEZING OR SHORTNESS OF BREATH 06/01/22   Young, Rennis Chris, MD  ALPRAZolam Prudy Feeler) 1 MG tablet Take 1 tablet (1 mg total) by mouth every 6 (six) hours as needed. for anxiety 02/22/23   Jetty Duhamel D, MD  apixaban Everlene Balls) 5 MG TABS tablet Take 1 tablet by mouth twice daily 02/17/23   Revankar, Aundra Dubin, MD  diltiazem (CARDIZEM CD) 120 MG 24 hr capsule Take 1 capsule (120 mg total) by mouth daily. 07/20/22   Rollene Rotunda, MD  diltiazem (CARDIZEM) 30 MG tablet TAKE 1 TABLET BY MOUTH EVERY 4 HOURS AS NEEDED (ELEVATED HEART RATE) 07/19/22   Rollene Rotunda, MD  esomeprazole (NEXIUM) 20 MG capsule Take 20 mg by mouth daily as needed (acid reflux). 08/19/22   [provider]  furosemide (LASIX) 40 MG tablet Take 1 tablet  by mouth twice daily 02/10/23   Rollene Rotunda, MD  Multiple Vitamin (MULTIVITAMIN) tablet Take 1 tablet by mouth daily.    [provider]  OZEMPIC, 0.25 OR 0.5 MG/DOSE, 2 MG/3ML SOPN Inject into the skin. 11/24/22   [provider]  pravastatin (PRAVACHOL) 10 MG tablet Take 10 mg by mouth daily.    [provider]  rosuvastatin (CRESTOR) 5 MG tablet Take 1 tablet (5 mg total) by mouth every other day. 12/26/22 03/26/23  Rollene Rotunda, MD  spironolactone (ALDACTONE) 25 MG tablet Take 0.5 tablets (12.5 mg total) by mouth daily. 06/06/22   Rollene Rotunda, MD  valACYclovir (VALTREX) 1000 MG tablet See admin instructions. 11/21/22   [provider]  Vitamin D, Ergocalciferol, (DRISDOL) 1.25 MG (50000 UNIT) CAPS capsule Take 50,000 Units by mouth every 7 (seven) days. 11/21/22   [provider]    Physical Exam    Vital Signs:  Felicia Odom does not have vital signs available for review today.   Given telephonic nature of communication, physical exam is limited. AAOx3. NAD. Normal affect.   Speech and respirations are unlabored.  Accessory Clinical Findings    None  Assessment & Plan    1.  Preoperative Cardiovascular Risk Assessment:  According to the Revised Cardiac Risk Index (RCRI), her Perioperative Risk of Major Cardiac Event is (%): 0.9  Her Functional Capacity in METs is: 9.34 according to the Duke Activity Status Index (DASI).   Per office protocol, patient can hold Eliquis for 2 days prior to procedure.  Patient will not need bridging with Lovenox (enoxaparin) around procedure.  Therefore, based on ACC/AHA guidelines, patient would be at acceptable risk for the planned procedure without further cardiovascular testing. I will route this recommendation to the requesting party via Epic fax function.   The patient was advised that if she develops new symptoms prior to surgery to contact our office to arrange for a follow-up visit, and she verbalized understanding.   A copy of this note will be routed to requesting surgeon.  Time:   Today, I have spent 15 minutes with the patient with telehealth technology discussing medical history, symptoms, and management plan.     Joni Reining, NP  03/01/2023, 9:45 AM

## 2023-03-01 ENCOUNTER — Ambulatory Visit: Payer: 59 | Attending: Cardiology

## 2023-03-01 DIAGNOSIS — Z01818 Encounter for other preprocedural examination: Secondary | ICD-10-CM | POA: Diagnosis not present

## 2023-03-03 ENCOUNTER — Other Ambulatory Visit: Payer: Self-pay

## 2023-03-03 NOTE — Telephone Encounter (Signed)
Patient informed okay to hold Eliquis for 2 days and she verbalized understanding.

## 2023-03-15 ENCOUNTER — Other Ambulatory Visit: Payer: Self-pay | Admitting: Internal Medicine

## 2023-03-15 NOTE — Telephone Encounter (Signed)
Alprazolam refilled.  

## 2023-03-21 ENCOUNTER — Telehealth: Payer: Self-pay | Admitting: Internal Medicine

## 2023-03-21 ENCOUNTER — Ambulatory Visit: Payer: 59 | Admitting: Internal Medicine

## 2023-03-21 ENCOUNTER — Encounter: Payer: Self-pay | Admitting: Internal Medicine

## 2023-03-21 VITALS — BP 122/68 | HR 77 | Temp 97.6°F | Ht 65.0 in | Wt 193.1 lb

## 2023-03-21 DIAGNOSIS — J410 Simple chronic bronchitis: Secondary | ICD-10-CM | POA: Diagnosis not present

## 2023-03-21 DIAGNOSIS — I4891 Unspecified atrial fibrillation: Secondary | ICD-10-CM | POA: Diagnosis not present

## 2023-03-21 MED ORDER — AZITHROMYCIN 250 MG PO TABS: ORAL_TABLET | ORAL | 0 refills | Status: DC

## 2023-03-21 NOTE — Assessment & Plan Note (Signed)
Followed by Cardiology after successful ablation

## 2023-03-21 NOTE — Patient Instructions (Addendum)
Order- 2 samples Trelegy 100   inhale 1 puff then rinse mouth, once daily  Ok to use otc cough syrup like Delsym or Robitussin DM  Script sent to [facility] for Zpak antibiotic  Order- Covid   Keep appt next April

## 2023-03-21 NOTE — Telephone Encounter (Signed)
Pt. Having a lot of breathing issues and is having diarrhea for 2 weeks now and her lungs are making crackling sound please advise pt.

## 2023-03-21 NOTE — Telephone Encounter (Signed)
Closing encounter since patient had OV with Dr. Maple Hudson today

## 2023-03-21 NOTE — Progress Notes (Unsigned)
Patient ID: Felicia Odom, female    DOB: 07-23-1956, 67 y.o.   MRN: [id number]  HPI F former smoker followed for COPD, Allergic rhinitis, complicated by Thymoma/ resected, anxiety Office spirometry- 01/29/15 Normal spirometry. FVC 2.74/81%, FEV1 2.11/79%, FEV1/FVC 0.77, FEF 25-75 percent 1.79 Office Spirometry 10/21/2015-slight restriction of exhaled volume, mild obstruction. FVC 2.69/78%, FEV1 2.04/76%, FEV1/FVC 0.76, FEF 25-75 percent 1.68/65% PFT 04/27/2016-minimal obstructive airways disease, minimal diffusion defect, insignificant response to bronchodilator. FVC 2.79/78%, FEV1 2.11/76%, ratio 0.76, FEF 25-75 percent 1.81/72%, TLC 92%, DLCO 76%. .----------------------------------------------------------------------------------------------------   11/11/22-  67 year old female former smoker (36 pk yrs) followed for Asthma/Bronchitis/ COPD, allergic Rhinitis, complicated by Resection Thymoma/ TSGY, OCD/panic, Anxiety/ Depression, GERD, DM2, HTN, Eczema, Osteoarthritis, Hyperlipidemia, AFib/ Eliquis, dCHF, Aortic Atherosclerosis, -Neb albuterol , Ventolin hfa, Xanax 1 mg, CT cardiacmorph/pulm vein for ablation 09/01/22- scarring, no acute -----Pt states she is doing okay. Some wheezing  Testing 123 testing 123 CT chest low dose screen 09/01/22-  IMPRESSION: 1. Lung-RADS 1, negative. Continue annual screening with low-dose chest CT without contrast in 12 months. 2. Coronary artery calcifications, aortic Atherosclerosis (ICD10-I70.0) and Emphysema (ICD10-J43.9). She has done well after cardiac ablation but complains that she still does not have energy/motivation to exercise more for weight loss.  Breathing has been well-controlled using her rescue inhaler maybe once daily.  She was asked about indications for using the nebulizer machine but has not felt she needed it.  Little cough or wheeze.  03/21/23-67 year old female former smoker (36 pk yrs) followed for Asthma/Bronchitis/ COPD,  allergic Rhinitis, complicated by Resection Thymoma/ TSGY, OCD/panic, Anxiety/ Depression, GERD, DM2, HTN, Eczema, Osteoarthritis, Hyperlipidemia, AFib/ Eliquis, dCHF, Aortic Atherosclerosis, -Neb albuterol , Ventolin hfa, Xanax 1 mg, Complains of cough with white/yellow mucus, no fever, feels weak "no energy" times a couple of days.  Chest was "popping and crackling" so she thought she ought to come and have me listen to it.  She has used her rescue inhaler and nebulizer. Newly diagnosed diabetes.  Has dieted off some weight.  Was on Ozempic but quit because of constipation.  Had successful cardiac ablation but continues Eliquis for now.  Review of Systems- see HPI + = positive Constitutional:  + weight loss, +night sweats, fevers, chills, fatigue, lassitude. HEENT:   No-  headaches, difficulty swallowing, tooth/dental problems, sore throat,       No-  sneezing, itching, ear ache, nasal congestion, post nasal drip,  CV:   chest pain, No-orthopnea, PND, swelling in lower extremities, anasarca, dizziness, palpitations Resp: + shortness of breath with exertion or at rest.              +productive cough,   non-productive cough,  No- coughing up of blood.            +change in color of mucus.  No- wheezing.   Skin: No-   rash or lesions. GI:    heartburn, indigestion, No-abdominal pain, nausea, vomiting,  GU:  MS:  + joint pain or swelling.  . Neuro-     nothing unusual Psych: change in mood or affect. +Chronic depression , + anxiety.  No memory loss.   Objective:   Physical Exam General- Alert, Oriented , Distress- none acute, + Obese.  Skin- rash-none, lesions- none, excoriation- none Lymphadenopathy- none Head- + healed scar forehead            Eyes- Gross vision intact, PERRLA, conjunctivae clear secretions            Ears- Hearing,  canals-normal            Nose- No- rhinorrhea, no-Septal dev, polyps, erosion, perforation             Throat- Mallampati II , mucosa clear , drainage- none,  tonsils- atrophic. +Missing teeth Neck- flexible , trachea midline, no stridor , thyroid nl, carotid no bruit Chest - symmetrical excursion , unlabored           Heart/CV- RRR ,  murmur+1S , no gallop, no rub, nl s1 s2                           - JVD- none , edema- none, stasis changes- none, varices- none           Lung- +bilateral wheeze,  unlabored, cough-none, dullness-none, rub- none,            Chest wall-  +R VATS scars s/p thymectomy. No rub. Abd-  Br/ Gen/ Rectal- Not done, not indicated Extrem-  Neuro- grossly intact to observation

## 2023-03-22 ENCOUNTER — Encounter: Payer: Self-pay | Admitting: Internal Medicine

## 2023-03-22 ENCOUNTER — Encounter: Payer: 59 | Admitting: Internal Medicine

## 2023-03-22 NOTE — Assessment & Plan Note (Addendum)
Acute bronchitis Plan-Z-Pak, samples for trial of Trelegy 100,

## 2023-03-24 ENCOUNTER — Telehealth: Payer: Self-pay | Admitting: Cardiology

## 2023-03-24 DIAGNOSIS — I480 Paroxysmal atrial fibrillation: Secondary | ICD-10-CM

## 2023-03-24 NOTE — Telephone Encounter (Signed)
*  STAT* If patient is at the pharmacy, call can be transferred to refill team.   1. Which medications need to be refilled? (please list name of each medication and dose if known) spironolactone (ALDACTONE) 25 MG tablet    2. Would you like to learn more about the convenience, safety, & potential cost savings by using the [facility]?     3. Are you open to using the [facility] (Type [facility].  ).   4. Which pharmacy/location (including street and city if local pharmacy) is medication to be sent to? [id number] - [place] ([place]), [place] - [address]    5. Do they need a 30 day or 90 day supply? 90 day

## 2023-03-24 NOTE — Telephone Encounter (Signed)
Pt's medication was sent to pt's pharmacy as requested. Confirmation received.  °

## 2023-03-29 ENCOUNTER — Other Ambulatory Visit: Payer: Self-pay | Admitting: Student

## 2023-03-29 ENCOUNTER — Ambulatory Visit
Admission: RE | Admit: 2023-03-29 | Discharge: 2023-03-29 | Disposition: A | Discharge status: Home / Self Care | Payer: 59 | Source: Ambulatory Visit | Attending: Student | Admitting: Student

## 2023-03-29 DIAGNOSIS — R059 Cough, unspecified: Secondary | ICD-10-CM

## 2023-03-30 ENCOUNTER — Ambulatory Visit: Payer: 59 | Admitting: Cardiology

## 2023-04-01 ENCOUNTER — Other Ambulatory Visit: Payer: Self-pay

## 2023-04-10 ENCOUNTER — Telehealth: Payer: Self-pay | Admitting: Internal Medicine

## 2023-04-10 NOTE — Telephone Encounter (Signed)
Patient would like to know the results of her chest x-ray. She received a message in [place] and would like to know what it means. Please call and advise.

## 2023-04-12 ENCOUNTER — Other Ambulatory Visit: Payer: Self-pay | Admitting: Internal Medicine

## 2023-04-12 DIAGNOSIS — J4520 Mild intermittent asthma, uncomplicated: Secondary | ICD-10-CM

## 2023-04-13 ENCOUNTER — Ambulatory Visit (AMBULATORY_SURGERY_CENTER): Payer: 59 | Admitting: Internal Medicine

## 2023-04-13 ENCOUNTER — Encounter: Payer: Self-pay | Admitting: Internal Medicine

## 2023-04-13 VITALS — BP 134/81 | HR 55 | Temp 97.3°F | Resp 12 | Ht 66.0 in | Wt 200.0 lb

## 2023-04-13 DIAGNOSIS — Z86010 Personal history of colonic polyps: Secondary | ICD-10-CM

## 2023-04-13 DIAGNOSIS — Z09 Encounter for follow-up examination after completed treatment for conditions other than malignant neoplasm: Secondary | ICD-10-CM

## 2023-04-13 DIAGNOSIS — D123 Benign neoplasm of transverse colon: Secondary | ICD-10-CM | POA: Diagnosis not present

## 2023-04-13 DIAGNOSIS — K635 Polyp of colon: Secondary | ICD-10-CM | POA: Diagnosis not present

## 2023-04-13 MED ORDER — SODIUM CHLORIDE 0.9 % IV SOLN: 500.0000 mL | Freq: Once | INTRAVENOUS | Status: DC

## 2023-04-13 NOTE — Progress Notes (Signed)
Called to room to assist during endoscopic procedure.  Patient ID and intended procedure confirmed with present staff. Received instructions for my participation in the procedure from the performing physician.  

## 2023-04-13 NOTE — Progress Notes (Signed)
[facility] History and Physical   Primary Care Physician:  Ellyn Hack, MD   Reason for Procedure:    Encounter Diagnosis  Name Primary?   Hx of colonic polyps Yes     Plan:    colonoscopy     HPI: Felicia Odom is a 67 y.o. female here for surveillance colonoscopy  10 mm ssp removed 2021  Having loose stools and some fecal and urinary incontinence  Past Medical History:  Diagnosis Date   Anxiety    Aortic atherosclerosis (HCC) 10/06/2021   Arthritis    neck   Cannabis dependence with current use (HCC) 07/26/2016   Chronic back pain    Chronic bronchitis (HCC)    Chronic diastolic CHF (congestive heart failure) (HCC)    Chronic neck pain    Cocaine abuse (HCC) 04/05/2021   COPD mixed type (HCC) 02/18/2019   Office Spirometry 10/21/2015-slight restriction of exhaled volume, mild obstruction. FVC 2.69/78%, FEV1 2.04/76%, FEV1/FVC 0.76, FEF 25-75 percent 1.68/65% PFT 04/27/2016-minimal obstructive airways disease, minimal diffusion defect, insignificant response to bronchodilator. FVC 2.79/78%, FEV1 2.11/76%, ratio 0.76, FEF 25-75 percent 1.81/72%, TLC 92%, DLCO 76%.   DDD (degenerative disc disease), cervical 06/15/2015   Diverticulosis    Eczema 11/29/2017   Encounter for long-term opiate analgesic use 07/06/2017   Essential hypertension 09/25/2015   GERD (gastroesophageal reflux disease)    takes Nexium daily   Helicobacter pylori gastritis 08/2019   Tx and eradicated   Hyperlipidemia    not on any meds bc refused to pick up from pharmacy   Long-term current use of opiate analgesic 04/05/2021   Moderate episode of recurrent major depressive disorder (HCC) 01/28/2020   Obesity (BMI 30.0-34.9) 12/16/2021   Obsessive-compulsive disorder 06/08/2007   Qualifier: Diagnosis of  By: Jerolyn Shin     PAF (paroxysmal atrial fibrillation) (HCC) 12/16/2021   PANIC DISORDER 06/08/2007   Qualifier: Diagnosis of  By: Jerolyn Shin     Pneumonia     2016   PONV (postoperative nausea and vomiting)    Primary osteoarthritis involving multiple joints 01/21/2015   Seborrheic dermatitis of scalp 02/11/2015   Seizures (HCC)    in the 80's   Thyroid nodule 07/30/2020   IMPRESSION: No significant interval change of bilateral thyroid nodules.   The above is in keeping with the ACR TI-RADS recommendations - J Am Coll Radiol 2017;14:587-595.     Electronically Signed   By: Acquanetta Belling M.D.   On: 08/17/2020 14:18   Type II diabetes mellitus with manifestations (HCC) 02/18/2019   The 10-year ASCVD risk score Denman George DC Montez Hageman., et al., 2013) is: 9.1%   Values used to calculate the score:     Age: 67 years     Sex: Female     Is Non-Hispanic African American: No     Diabetic: Yes     Tobacco smoker: No     Systolic Blood Pressure: 138 mmHg     Is BP treated: No     HDL Cholesterol: 46.2 mg/dL     Total Cholesterol: 180 mg/dL    Past Surgical History:  Procedure Laterality Date   ATRIAL FIBRILLATION ABLATION N/A 09/08/2022   Procedure: ATRIAL FIBRILLATION ABLATION;  Surgeon: Regan Lemming, MD;  Location: [facility];  Service: Cardiovascular;  Laterality: N/A;   COLONOSCOPY     ECTOPIC PREGNANCY SURGERY     x 2    ESOPHAGOGASTRODUODENOSCOPY     LAPAROSCOPIC LYSIS INTESTINAL ADHESIONS  x 2   RESECTION OF MEDIASTINAL MASS N/A 06/10/2016   Procedure: RESECTION OF MEDIASTINAL MASS;  Surgeon: Delight Ovens, MD;  Location: [facility] OR;  Service: Thoracic;  Laterality: N/A;   VIDEO ASSISTED THORACOSCOPY Right 06/10/2016   Procedure: VIDEO ASSISTED THORACOSCOPY WITH PLACEMENT OF ON Q TUNNELER;  Surgeon: Delight Ovens, MD;  Location: [facility] OR;  Service: Thoracic;  Laterality: Right;   VIDEO BRONCHOSCOPY N/A 06/10/2016   Procedure: VIDEO BRONCHOSCOPY;  Surgeon: Delight Ovens, MD;  Location: [facility] OR;  Service: Thoracic;  Laterality: N/A;    Prior to Admission medications   Medication Sig Start Date End Date Taking? Authorizing Provider   ACCU-CHEK GUIDE test strip USE 1 TO CHECK BLOOD SUGARS ONCE A WEEK 11/25/22  Yes [provider]  albuterol (PROVENTIL) (2.5 MG/3ML) 0.083% nebulizer solution USE 1 VIAL IN NEBULIZER EVERY 6 HOURS - And As Needed 04/12/23  Yes Young, Clinton D, MD  albuterol (VENTOLIN HFA) 108 (90 Base) MCG/ACT inhaler INHALE 1 TO 2 PUFFS INTO THE LUNGS EVERY 4 HOURS AS NEEDED FOR WHEEZING OR SHORTNESS OF BREATH 06/01/22  Yes Young, Clinton D, MD  ALPRAZolam (XANAX) 1 MG tablet TAKE 1 TABLET (1 MG TOTAL) BY MOUTH EVERY 6 (SIX) HOURS AS NEEDED. FOR ANXIETY 03/15/23  Yes Young, Joni Fears D, MD  diltiazem (CARDIZEM CD) 120 MG 24 hr capsule Take 1 capsule (120 mg total) by mouth daily. 07/20/22  Yes Rollene Rotunda, MD  furosemide (LASIX) 40 MG tablet Take 1 tablet by mouth twice daily 02/10/23  Yes Rollene Rotunda, MD  spironolactone (ALDACTONE) 25 MG tablet Take 1/2 (one-half) tablet by mouth once daily 03/24/23  Yes Hochrein, Fayrene Fearing, MD  Vitamin D, Ergocalciferol, (DRISDOL) 1.25 MG (50000 UNIT) CAPS capsule Take 50,000 Units by mouth every 7 (seven) days. 11/21/22  Yes [provider]  acetaminophen (TYLENOL) 500 MG tablet Take 1,000 mg by mouth every 6 (six) hours as needed for fever or headache.    [provider]  apixaban (ELIQUIS) 5 MG TABS tablet Take 1 tablet by mouth twice daily 02/17/23   Revankar, Aundra Dubin, MD  azithromycin (ZITHROMAX) 250 MG tablet 2 today then one daily Patient not taking: Reported on 04/13/2023 03/21/23   Waymon Budge, MD  diltiazem (CARDIZEM) 30 MG tablet TAKE 1 TABLET BY MOUTH EVERY 4 HOURS AS NEEDED (ELEVATED HEART RATE) 07/19/22   Rollene Rotunda, MD  esomeprazole (NEXIUM) 20 MG capsule Take 20 mg by mouth daily as needed (acid reflux). 08/19/22   [provider]  Multiple Vitamin (MULTIVITAMIN) tablet Take 1 tablet by mouth daily.    [provider]  pravastatin (PRAVACHOL) 10 MG tablet Take 10 mg by mouth daily.    [provider]   rosuvastatin (CRESTOR) 5 MG tablet Take 1 tablet (5 mg total) by mouth every other day. 12/26/22 03/26/23  Rollene Rotunda, MD  valACYclovir (VALTREX) 1000 MG tablet See admin instructions. 11/21/22   [provider]    Current Outpatient Medications  Medication Sig Dispense Refill   ACCU-CHEK GUIDE test strip USE 1 TO CHECK BLOOD SUGARS ONCE A WEEK     albuterol (PROVENTIL) (2.5 MG/3ML) 0.083% nebulizer solution USE 1 VIAL IN NEBULIZER EVERY 6 HOURS - And As Needed 90 mL 11   albuterol (VENTOLIN HFA) 108 (90 Base) MCG/ACT inhaler INHALE 1 TO 2 PUFFS INTO THE LUNGS EVERY 4 HOURS AS NEEDED FOR WHEEZING OR SHORTNESS OF BREATH 18 each 12   ALPRAZolam (XANAX) 1 MG tablet TAKE 1  TABLET (1 MG TOTAL) BY MOUTH EVERY 6 (SIX) HOURS AS NEEDED. FOR ANXIETY 120 tablet 3   diltiazem (CARDIZEM CD) 120 MG 24 hr capsule Take 1 capsule (120 mg total) by mouth daily. 90 capsule 3   furosemide (LASIX) 40 MG tablet Take 1 tablet by mouth twice daily 180 tablet 3   spironolactone (ALDACTONE) 25 MG tablet Take 1/2 (one-half) tablet by mouth once daily 45 tablet 1   Vitamin D, Ergocalciferol, (DRISDOL) 1.25 MG (50000 UNIT) CAPS capsule Take 50,000 Units by mouth every 7 (seven) days.     acetaminophen (TYLENOL) 500 MG tablet Take 1,000 mg by mouth every 6 (six) hours as needed for fever or headache.     apixaban (ELIQUIS) 5 MG TABS tablet Take 1 tablet by mouth twice daily 180 tablet 1   azithromycin (ZITHROMAX) 250 MG tablet 2 today then one daily (Patient not taking: Reported on 04/13/2023) 6 tablet 0   diltiazem (CARDIZEM) 30 MG tablet TAKE 1 TABLET BY MOUTH EVERY 4 HOURS AS NEEDED (ELEVATED HEART RATE) 90 tablet 3   esomeprazole (NEXIUM) 20 MG capsule Take 20 mg by mouth daily as needed (acid reflux).     Multiple Vitamin (MULTIVITAMIN) tablet Take 1 tablet by mouth daily.     pravastatin (PRAVACHOL) 10 MG tablet Take 10 mg by mouth daily.     rosuvastatin (CRESTOR) 5 MG tablet Take 1 tablet (5 mg total) by  mouth every other day. 45 tablet 3   valACYclovir (VALTREX) 1000 MG tablet See admin instructions.     Current Facility-Administered Medications  Medication Dose Route Frequency Provider Last Rate Last Admin   0.9 %  sodium chloride infusion  500 mL Intravenous Once Iva Boop, MD        Allergies as of 04/13/2023 - Review Complete 04/13/2023  Allergen Reaction Noted   Sulfa antibiotics Hives and Swelling 04/04/2022    Family History  Problem Relation Age of Onset   Heart attack Father    Prostate cancer Father    Heart failure Father    Diabetes Mother    Clotting disorder Mother        PE   Heart failure Mother    Kidney disease Mother    Breast cancer Sister    Colon cancer Sister    Esophageal cancer Neg Hx    Rectal cancer Neg Hx    Stomach cancer Neg Hx     Social History   Socioeconomic History   Marital status: Divorced    Spouse name: Not on file   Number of children: 1   Years of education: Not on file   Highest education level: Not on file  Occupational History   Occupation: retired  Tobacco Use   Smoking status: Former    Current packs/day: 0.00    Average packs/day: 1 pack/day for 36.0 years (36.0 ttl pk-yrs)    Types: Cigarettes    Start date: 33    Quit date: 2017    Years since quitting: 7.7   Smokeless tobacco: Never   Tobacco comments:    Former smoker last cigarette 2017 04/15/22  Vaping Use   Vaping status: Never Used  Substance and Sexual Activity   Alcohol use: No    Alcohol/week: 0.0 standard drinks of alcohol   Drug use: No   Sexual activity: Not on file  Other Topics Concern   Not on file  Social History Narrative   Retired [employer]   Divorced  1 son   Former smoker   2 coffee/daty   No EtOH, no drugs   Social Determinants of Corporate investment banker Strain: Not on file  Food Insecurity: Not on file  Transportation Needs: Not on file  Physical Activity: Not on file  Stress: Not on file  Social  Connections: Not on file  Intimate Partner Violence: Not on file    Review of Systems: All other review of systems negative except as mentioned in the HPI.  Physical Exam: Vital signs BP (!) 106/57   Pulse 60   Temp (!) 97.3 F (36.3 C)   Ht 5\' 6"  (1.676 m)   Wt 200 lb (90.7 kg)   SpO2 96%   BMI 32.28 kg/m   General:   Alert,  Well-developed, well-nourished, pleasant and cooperative in NAD Lungs:  Clear throughout to auscultation.   Heart:  Regular rate and rhythm; no murmurs, clicks, rubs,  or gallops. Abdomen:  Soft, nontender and nondistended. Normal bowel sounds.   Neuro/Psych:  Alert and cooperative. Normal mood and affect. A and O x 3   @Moris Ratchford  Sena Slate, MD, [facility] [phone] (pager) 04/13/2023 8:15 AM@

## 2023-04-13 NOTE — Op Note (Addendum)
[facility] Patient Name: Felicia Odom Procedure Date: 04/13/2023 8:01 AM MRN: [id number] Endoscopist: Iva Boop , MD, [id number] Age: 67 Referring MD:  Date of Birth: Feb 13, 1956 Gender: Female Account #: [id number] Procedure:                Colonoscopy Indications:              High risk colon cancer surveillance: Personal                            history of sessile serrated colon polyp (10 mm or                            greater in size), Last colonoscopy: 2021 Medicines:                Monitored Anesthesia Care Procedure:                Pre-Anesthesia Assessment:                           - Prior to the procedure, a History and Physical                            was performed, and patient medications and                            allergies were reviewed. The patient's tolerance of                            previous anesthesia was also reviewed. The risks                            and benefits of the procedure and the sedation                            options and risks were discussed with the patient.                            All questions were answered, and informed consent                            was obtained. Prior Anticoagulants: The patient                            last took Eliquis (apixaban) 2 days prior to the                            procedure. ASA Grade Assessment: III - A patient                            with severe systemic disease. After reviewing the                            risks and benefits, the patient was deemed in  satisfactory condition to undergo the procedure.                           After obtaining informed consent, the colonoscope                            was passed under direct vision. Throughout the                            procedure, the patient's blood pressure, pulse, and                            oxygen saturations were monitored continuously. The                             Olympus CF-HQ190L [id number]) Colonoscope was                            introduced through the anus and advanced to the the                            cecum, identified by appendiceal orifice and                            ileocecal valve. The colonoscopy was performed                            without difficulty. The patient tolerated the                            procedure well. The quality of the bowel                            preparation was good. The ileocecal valve,                            appendiceal orifice, and rectum were photographed.                            The bowel preparation used was Miralax via split                            dose instruction. Scope In: 8:29:26 AM Scope Out: 8:44:09 AM Scope Withdrawal Time: 0 hours 12 minutes 13 seconds  Total Procedure Duration: 0 hours 14 minutes 43 seconds  Findings:                 The digital rectal exam findings include decreased                            voluntary sphincter tone, small rectocele and                            exagerated descent with simulated defecation.  A 15 mm polyp was found in the transverse colon.                            The polyp was sessile. The polyp was removed with a                            piecemeal technique using a cold snare. Resection                            and retrieval were complete. Verification of                            patient identification for the specimen was done.                            Estimated blood loss was minimal.                           Multiple small-mouthed diverticula were found in                            the sigmoid colon.                           The exam was otherwise without abnormality on                            direct and retroflexion views. Complications:            No immediate complications. Estimated Blood Loss:     Estimated blood loss was minimal. Impression:               - Decreased voluntary sphincter  tone, small                            rectocele and exagerated descent found on digital                            rectal exam. She has fecal incontinence issues and                            chronic loose stools (negative colon biopsies 2021)                           - One 15 mm polyp in the transverse colon, removed                            piecemeal using a cold snare. Resected and                            retrieved.                           - Diverticulosis in the sigmoid colon.                           -  The examination was otherwise normal on direct                            and retroflexion views.                           - Personal history of colonic polyps. 10 mm ssp 2021 Recommendation:           - Patient has a contact number available for                            emergencies. The signs and symptoms of potential                            delayed complications were discussed with the                            patient. Return to normal activities tomorrow.                            Written discharge instructions were provided to the                            patient.                           - Resume previous diet.                           - Continue present medications.                           - Repeat colonoscopy is recommended for                            surveillance. The colonoscopy date will be                            determined after pathology results from today's                            exam become available for review.                           - My office will refer to urogynecology - evaluate                            and treat urinary and fecal incontinence                           - She is to try Benefiber 1-2 tabsp/day to firm up                            stools  Restart Eliquis tomorrow Iva Boop, MD 04/13/2023 8:54:14 AM This report has been signed electronically.

## 2023-04-13 NOTE — Patient Instructions (Addendum)
Handouts provided about diverticulosis and polyps.   Resume previous diet. Repeat colonoscopy is recommended for surveillance.   Try Benefiber 1-2 tabsp/day to firm up stools. My office will refer to urogynecology-evaluate and treat urinary and fecal incontinence. Resume Eliquis at previous dose tomorrow.   YOU HAD AN ENDOSCOPIC PROCEDURE TODAY AT THE [facility]:   Refer to the procedure report that was given to you for any specific questions about what was found during the examination.  If the procedure report does not answer your questions, please call your gastroenterologist to clarify.  If you requested that your care partner not be given the details of your procedure findings, then the procedure report has been included in a sealed envelope for you to review at your convenience later.  YOU SHOULD EXPECT: Some feelings of bloating in the abdomen. Passage of more gas than usual.  Walking can help get rid of the air that was put into your GI tract during the procedure and reduce the bloating. If you had a lower endoscopy (such as a colonoscopy or flexible sigmoidoscopy) you may notice spotting of blood in your stool or on the toilet paper. If you underwent a bowel prep for your procedure, you may not have a normal bowel movement for a few days.  Please Note:  You might notice some irritation and congestion in your nose or some drainage.  This is from the oxygen used during your procedure.  There is no need for concern and it should clear up in a day or so.  SYMPTOMS TO REPORT IMMEDIATELY:  Following lower endoscopy (colonoscopy or flexible sigmoidoscopy):  Excessive amounts of blood in the stool  Significant tenderness or worsening of abdominal pains  Swelling of the abdomen that is new, acute  Fever of 100F or higher  For urgent or emergent issues, a gastroenterologist can be reached at any hour by calling [phone]. Do not use MyChart messaging for urgent concerns.     DIET:  We do recommend a small meal at first, but then you may proceed to your regular diet.  Drink plenty of fluids but you should avoid alcoholic beverages for 24 hours.  ACTIVITY:  You should plan to take it easy for the rest of today and you should NOT DRIVE or use heavy machinery until tomorrow (because of the sedation medicines used during the test).    FOLLOW UP: Our staff will call the number listed on your records the next business day following your procedure.  We will call around 7:15- 8:00 am to check on you and address any questions or concerns that you may have regarding the information given to you following your procedure. If we do not reach you, we will leave a message.     If any biopsies were taken you will be contacted by phone or by letter within the next 1-3 weeks.  Please call us at [phone] if you have not heard about the biopsies in 3 weeks.    SIGNATURES/CONFIDENTIALITY: You and/or your care partner have signed paperwork which will be entered into your electronic medical record.  These signatures attest to the fact that that the information above on your After Visit Summary has been reviewed and is understood.  Full responsibility of the confidentiality of this discharge information lies with you and/or your care-partner.I found and removed another polyp - looks benign.  I will let you know pathology results and when to have another routine colonoscopy by mail and/or My Chart.  You also have a condition called diverticulosis - common and not usually a problem. Please read the handout provided.  I am going to refer you to a urogynecologist to see if you can get help with urinary and fecal leakage.  Please try taking 1-2 tablespoons of Benefiber a day to see if that helps firm up bowel movements.  I appreciate the opportunity to care for you. Iva Boop, MD, [facility]

## 2023-04-13 NOTE — Progress Notes (Signed)
Vss nad trans to pacu 

## 2023-04-14 ENCOUNTER — Other Ambulatory Visit: Payer: Self-pay | Admitting: Cardiology

## 2023-04-14 DIAGNOSIS — I480 Paroxysmal atrial fibrillation: Secondary | ICD-10-CM

## 2023-04-14 NOTE — Telephone Encounter (Signed)
Dr. Maple Hudson, please advise on the results of pt's recent cxr.   **Please please send back to triage pool and not directly back to me.**

## 2023-04-14 NOTE — Telephone Encounter (Signed)
Prescription refill request for Eliquis received. Indication:afib Last office visit:6/24 Scr:0.95  2/24 Age: 67 Weight:90.7  kg  Prescription refilled

## 2023-04-14 NOTE — Telephone Encounter (Signed)
CXR- looks clear with no active problem. The report will have gone back to the Nurse Practitioner who ordered the study.

## 2023-04-15 ENCOUNTER — Other Ambulatory Visit: Payer: Self-pay | Admitting: Cardiology

## 2023-04-15 ENCOUNTER — Other Ambulatory Visit: Payer: Self-pay | Admitting: Internal Medicine

## 2023-04-15 DIAGNOSIS — I480 Paroxysmal atrial fibrillation: Secondary | ICD-10-CM

## 2023-04-17 ENCOUNTER — Telehealth: Payer: Self-pay | Admitting: Cardiology

## 2023-04-17 ENCOUNTER — Telehealth: Payer: Self-pay | Admitting: *Deleted

## 2023-04-17 NOTE — Telephone Encounter (Signed)
  Follow up Call-     04/13/2023    7:52 AM  Call back number  Post procedure Call Back phone  # [phone]  Permission to leave phone message Yes     Patient questions:  Do you have a fever, pain , or abdominal swelling? No. Pain Score  0 *  Have you tolerated food without any problems? Yes.    Have you been able to return to your normal activities? Yes.    Do you have any questions about your discharge instructions: Diet   No. Medications  No. Follow up visit  No.  Do you have questions or concerns about your Care? No.  Actions: * If pain score is 4 or above: No action needed, pain <4.

## 2023-04-17 NOTE — Telephone Encounter (Signed)
*  STAT* If patient is at the pharmacy, call can be transferred to refill team.   1. Which medications need to be refilled? (please list name of each medication and dose if known)   diltiazem (CARDIZEM CD) 120 MG 24 hr capsule  furosemide (LASIX) 40 MG tablet    2. Would you like to learn more about the convenience, safety, & potential cost savings by using the [facility]? No   3. Are you open to using the [facility] (Type [facility] No   4. Which pharmacy/location (including street and city if local pharmacy) is medication to be sent to?[id number] - [place] ([place]), [place] - [address]    5. Do they need a 30 day or 90 day supply? 90 day

## 2023-04-18 LAB — SURGICAL PATHOLOGY

## 2023-04-19 ENCOUNTER — Other Ambulatory Visit: Payer: Self-pay

## 2023-04-19 ENCOUNTER — Telehealth: Payer: Self-pay | Admitting: Internal Medicine

## 2023-04-19 DIAGNOSIS — R159 Full incontinence of feces: Secondary | ICD-10-CM

## 2023-04-19 NOTE — Telephone Encounter (Signed)
I spoke with Felicia Odom and told her I have placed the urogyno referral because I didn't see one in there. I gave her their number [phone] and address: [address], [place] [address]  Dr Leone Payor ordered this referral after her procedure 04/13/2023 to eval and treat her for urinary and fecal incontinence. I told her they will call her but to feel free to call them to check on this.

## 2023-04-19 NOTE — Telephone Encounter (Signed)
Inbound call from patient, states she was supposed to receive a referral to a urologist, states she has not received any calls in regards to this, and she is not doing any better.

## 2023-04-20 ENCOUNTER — Encounter: Payer: Self-pay | Admitting: Internal Medicine

## 2023-04-25 ENCOUNTER — Other Ambulatory Visit (HOSPITAL_COMMUNITY): Payer: Self-pay

## 2023-05-04 ENCOUNTER — Other Ambulatory Visit: Payer: Self-pay | Admitting: Cardiology

## 2023-05-05 ENCOUNTER — Other Ambulatory Visit (HOSPITAL_COMMUNITY): Payer: Self-pay

## 2023-05-09 ENCOUNTER — Other Ambulatory Visit: Payer: Self-pay | Admitting: Cardiology

## 2023-05-16 ENCOUNTER — Ambulatory Visit: Payer: 59 | Admitting: Obstetrics and Gynecology

## 2023-05-21 NOTE — Progress Notes (Signed)
Cardiology Office Note:   Date:  05/26/2023  ID:  Felicia Odom, DOB [birth year], MRN [id number] PCP: Ellyn Hack, MD  [facility] Providers Cardiologist:  Rollene Rotunda, MD Electrophysiologist:  Will Jorja Loa, MD {  History of Present Illness:   Felicia Odom is a 67 y.o. female who presents for evaluation of atrial fib.    She was in the ED and 03/25/2022 for this.  She was in atrial fib. She converted back to NSR after IV Cardizem drip.  She did not appear to be volume overloaded.  She was started on spironolactone to help with volume and hypokalemia.   Echo on 8/29 demonstrated normal LV function with mild AS.  Of note her event monitor which she wore in August demonstrated paroxysms of A-fib.  She has sinus bradycardia with pauses of 3 seconds as well.  She had nonsustained ventricular tachycardia up to 5 beats.  Her atrial rate with fibrillation averaged at 135.   She saw Dr. Elberta Fortis and now is status post ablation.     She says she is not having any tachypalpitations that she can feel.  Very weak being in the room.  She wants to know about whether she can stop any of her medicines but only if we suggested.  She has lost about 30 pounds just through diet alone.  She walks on her treadmill and walks outside.  She does not have any chest discomfort or shortness of breath with this.  She does not have any palpitations, presyncope or syncope.  She has had no edema.  ROS: As stated in the HPI and negative for all other systems.  Studies Reviewed:    EKG:   NA  Risk Assessment/Calculations:    CHA2DS2-VASc Score = 6   This indicates a 9.7% annual risk of stroke. The patient's score is based upon: CHF History: 1 HTN History: 1 Diabetes History: 1 Stroke History: 0 Vascular Disease History: 1 Age Score: 1 Gender Score: 1       Physical Exam:   VS:  BP 112/70 (BP Location: Left Arm, Patient Position: Sitting, Cuff Size: Normal)   Pulse 67   Ht 5\' 6"   (1.676 m)   Wt 190 lb 6.4 oz (86.4 kg)   SpO2 97%   BMI 30.73 kg/m    Wt Readings from Last 3 Encounters:  05/25/23 190 lb 6.4 oz (86.4 kg)  05/23/23 190 lb (86.2 kg)  04/13/23 200 lb (90.7 kg)     GEN: Well nourished, well developed in no acute distress NECK: No JVD; No carotid bruits CARDIAC: RRR, soft apical systolic murmur, no diastolic murmurs, rubs, gallops RESPIRATORY:  Clear to auscultation without rales, wheezing or rhonchi  ABDOMEN: Soft, non-tender, non-distended EXTREMITIES:  No edema; No deformity   ASSESSMENT AND PLAN:   Atrial fib with RVR:    She seems to be maintaining sinus rhythm.  Her amiodarone was stopped previously.  I am and keep her on Eliquis.  I might consider stopping the Cardizem if she is maintaining sinus rhythm and has low blood pressures in the future.  Hypercoaguable state: She tolerates anticoagulation.  No change in therapy.   Aortic atherosclerosis:   I will get the most recent lipids from her primary care office.  I like her to be on at least 5 mg of Crestor daily.  She is taking this not every day.  Murmur: She has mild aortic stenosis.  I will follow this clinically.  Follow up in December  Signed, Rollene Rotunda, MD

## 2023-05-22 NOTE — Progress Notes (Unsigned)
  Electrophysiology Office Note:   Date:  05/23/2023  ID:  Felicia Odom, DOB May 16, 1956, MRN [id number]  Primary Cardiologist: Rollene Rotunda, MD Electrophysiologist: Regan Lemming, MD      History of Present Illness:   Felicia Odom is a 67 y.o. female with h/o Aortic Atherosclerosis, Hypokalemia, HTN, HLD, COPD, and paroxysmal AF seen today for routine electrophysiology followup.   Since last being seen in our clinic the patient reports doing well overall. She is, however, frustrated about having to remain on medications. She  denies chest pain, palpitations, dyspnea, PND, orthopnea, nausea, vomiting, dizziness, syncope, edema, weight gain, or early satiety. She is very anxious about becoming ill and the possibility of having to call 911 or go to the hospital. She takes Xanax prn, but not on any long acting anxiolytic.   Review of systems complete and found to be negative unless listed in HPI.   EP Information / Studies Reviewed:    EKG is ordered today. Personal review as below.  EKG Interpretation Date/Time:  Tuesday May 23 2023 08:09:04 EDT Ventricular Rate:  70 PR Interval:  138 QRS Duration:  92 QT Interval:  398 QTC Calculation: 429 R Axis:   -40  Text Interpretation: Normal sinus rhythm Left axis deviation Low voltage QRS Nonspecific ST abnormality Confirmed by Maxine Glenn [phone]) on 05/23/2023 8:13:22 AM    Arrhythmia History  S/p ablation 09/08/2002 Amiodarone stopped 11/2022 s/p ablation with maintenance of NSR  Physical Exam:   VS:  BP 114/70   Pulse 70   Ht 5\' 6"  (1.676 m)   Wt 190 lb (86.2 kg)   SpO2 97%   BMI 30.67 kg/m    Wt Readings from Last 3 Encounters:  05/23/23 190 lb (86.2 kg)  04/13/23 200 lb (90.7 kg)  03/21/23 193 lb 1.6 oz (87.6 kg)     GEN: Well nourished, well developed in no acute distress NECK: No JVD; No carotid bruits CARDIAC: Regular rate and rhythm, no murmurs, rubs, gallops RESPIRATORY:  Clear to  auscultation without rales, wheezing or rhonchi  ABDOMEN: Soft, non-tender, non-distended EXTREMITIES:  No edema; No deformity   ASSESSMENT AND PLAN:    Paroxysmal Atrial Fibrillation  S/p Ablation 08/2022 EKG today shows NSR Maintaining NSR Continue Eliquis 5mg  BID for CHA2DS2VASC of at least 4    Obesity Body mass index is 30.67 kg/m.  Encouraged lifestyle modification   HTN Stable on current regimen   Secondary hypercoagulable state Pt on Eliquis as above   Anxiety Have recommended she discuss with PCP and consider a long acting agent.  She does not wish to be on any "more pills"   Follow up with Dr. Elberta Fortis in 6 months  Signed, Graciella Freer, PA-C

## 2023-05-23 ENCOUNTER — Encounter: Payer: Self-pay | Admitting: Student

## 2023-05-23 ENCOUNTER — Ambulatory Visit: Payer: 59 | Attending: Student | Admitting: Student

## 2023-05-23 VITALS — BP 114/70 | HR 70 | Ht 66.0 in | Wt 190.0 lb

## 2023-05-23 DIAGNOSIS — E785 Hyperlipidemia, unspecified: Secondary | ICD-10-CM | POA: Diagnosis not present

## 2023-05-23 DIAGNOSIS — E66811 Obesity, class 1: Secondary | ICD-10-CM | POA: Diagnosis not present

## 2023-05-23 DIAGNOSIS — I480 Paroxysmal atrial fibrillation: Secondary | ICD-10-CM | POA: Diagnosis not present

## 2023-05-23 DIAGNOSIS — I10 Essential (primary) hypertension: Secondary | ICD-10-CM | POA: Diagnosis not present

## 2023-05-23 NOTE — Patient Instructions (Signed)
Medication Instructions:  Your physician recommends that you continue on your current medications as directed. Please refer to the Current Medication list given to you today.  *If you need a refill on your cardiac medications before your next appointment, please call your pharmacy*  Lab Work: BMET, CBC--TODAY If you have labs (blood work) drawn today and your tests are completely normal, you will receive your results only by: MyChart Message (if you have MyChart) OR A paper copy in the mail If you have any lab test that is abnormal or we need to change your treatment, we will call you to review the results.  Follow-Up: At [facility], you and your health needs are our priority.  As part of our continuing mission to provide you with exceptional heart care, we have created designated Provider Care Teams.  These Care Teams include your primary Cardiologist (physician) and Advanced Practice Providers (APPs -  Physician Assistants and Nurse Practitioners) who all work together to provide you with the care you need, when you need it.  Your next appointment:   6 month(s)  Provider:   Will Camnitz, MD  

## 2023-05-24 ENCOUNTER — Other Ambulatory Visit: Payer: Self-pay

## 2023-05-24 LAB — BASIC METABOLIC PANEL
BUN/Creatinine Ratio: 10 — ABNORMAL LOW (ref 12–28)
BUN: 10 mg/dL (ref 8–27)
CO2: 23 mmol/L (ref 20–29)
Calcium: 9 mg/dL (ref 8.7–10.3)
Chloride: 100 mmol/L (ref 96–106)
Creatinine, Ser: 0.98 mg/dL (ref 0.57–1.00)
Glucose: 87 mg/dL (ref 70–99)
Potassium: 3.6 mmol/L (ref 3.5–5.2)
Sodium: 140 mmol/L (ref 134–144)
eGFR: 64 mL/min/{1.73_m2} (ref >59)

## 2023-05-24 LAB — CBC
Hematocrit: 47.9 % — ABNORMAL HIGH (ref 34.0–46.6)
Hemoglobin: 15.4 g/dL (ref 11.1–15.9)
MCH: 28.9 pg (ref 26.6–33.0)
MCHC: 32.2 g/dL (ref 31.5–35.7)
MCV: 90 fL (ref 79–97)
Platelets: 288 10*3/uL (ref 150–450)
RBC: 5.32 x10E6/uL — ABNORMAL HIGH (ref 3.77–5.28)
RDW: 13.6 % (ref 11.7–15.4)
WBC: 7.4 10*3/uL (ref 3.4–10.8)

## 2023-05-25 ENCOUNTER — Ambulatory Visit: Payer: 59 | Attending: Cardiology | Admitting: Cardiology

## 2023-05-25 ENCOUNTER — Encounter: Payer: Self-pay | Admitting: Cardiology

## 2023-05-25 VITALS — BP 112/70 | HR 67 | Ht 66.0 in | Wt 190.4 lb

## 2023-05-25 DIAGNOSIS — I480 Paroxysmal atrial fibrillation: Secondary | ICD-10-CM | POA: Diagnosis not present

## 2023-05-25 DIAGNOSIS — I700 Atherosclerosis of aorta: Secondary | ICD-10-CM

## 2023-05-25 DIAGNOSIS — I350 Nonrheumatic aortic (valve) stenosis: Secondary | ICD-10-CM

## 2023-05-25 NOTE — Patient Instructions (Signed)
Medication Instructions:  Your physician has recommended you make the following change in your medication:  STOP: Spirolactone (Aldactone) *If you need a refill on your cardiac medications before your next appointment, please call your pharmacy*   Lab Work: None    Testing/Procedures: None   Follow-Up: At [facility], you and your health needs are our priority.  As part of our continuing mission to provide you with exceptional heart care, we have created designated Provider Care Teams.  These Care Teams include your primary Cardiologist (physician) and Advanced Practice Providers (APPs -  Physician Assistants and Nurse Practitioners) who all work together to provide you with the care you need, when you need it.   Your next appointment:    December  Provider:   Rollene Rotunda, MD

## 2023-05-26 ENCOUNTER — Encounter: Payer: Self-pay | Admitting: Cardiology

## 2023-05-30 ENCOUNTER — Encounter: Payer: Self-pay | Admitting: Obstetrics and Gynecology

## 2023-05-30 ENCOUNTER — Ambulatory Visit (INDEPENDENT_AMBULATORY_CARE_PROVIDER_SITE_OTHER): Payer: 59 | Admitting: Obstetrics and Gynecology

## 2023-05-30 VITALS — BP 112/76 | HR 66 | Ht 64.49 in | Wt 191.4 lb

## 2023-05-30 DIAGNOSIS — R350 Frequency of micturition: Secondary | ICD-10-CM

## 2023-05-30 DIAGNOSIS — N393 Stress incontinence (female) (male): Secondary | ICD-10-CM

## 2023-05-30 DIAGNOSIS — R159 Full incontinence of feces: Secondary | ICD-10-CM | POA: Diagnosis not present

## 2023-05-30 DIAGNOSIS — N3281 Overactive bladder: Secondary | ICD-10-CM

## 2023-05-30 DIAGNOSIS — N816 Rectocele: Secondary | ICD-10-CM

## 2023-05-30 LAB — POCT URINALYSIS DIPSTICK
Bilirubin, UA: NEGATIVE
Blood, UA: NEGATIVE
Glucose, UA: NEGATIVE
Ketones, UA: NEGATIVE
Leukocytes, UA: NEGATIVE
Nitrite, UA: NEGATIVE
Protein, UA: NEGATIVE
Spec Grav, UA: 1.01 (ref 1.010–1.025)
Urobilinogen, UA: 0.2 U/dL
pH, UA: 6 (ref 5.0–8.0)
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
## 2023-06-09 ENCOUNTER — Other Ambulatory Visit: Payer: Self-pay | Admitting: Cardiology

## 2023-06-12 ENCOUNTER — Ambulatory Visit: Payer: 59 | Admitting: Student

## 2023-06-14 ENCOUNTER — Other Ambulatory Visit: Payer: Self-pay | Admitting: Cardiology

## 2023-06-19 ENCOUNTER — Other Ambulatory Visit: Payer: Self-pay

## 2023-06-19 ENCOUNTER — Telehealth: Payer: Self-pay | Admitting: Internal Medicine

## 2023-06-19 DIAGNOSIS — R197 Diarrhea, unspecified: Secondary | ICD-10-CM

## 2023-06-19 NOTE — Telephone Encounter (Signed)
Inbound call from patient, states she would like a "culture" order, in regards to the diarrhea. She states her stool looks like "brown water" and has not improved even after referral. Would like something done she states. Please advise.

## 2023-06-19 NOTE — Telephone Encounter (Signed)
Pt states that she has been having ongoing issues with diarrhea since her colonoscopy on 04/13/2023. Pt stated that she was referred to physical therapy for incontinence but it has not helped the multiple loose stools that she has been having daily. Pt stated that she has not been taking anything OTC. Pt was recommended that she try OTC imodium. Take as directed on package and call us back on Monday next week if her symptoms have not improved.  Please advise if other recommendations:

## 2023-06-19 NOTE — Telephone Encounter (Signed)
Please do C diff PCR and a GI pathogen panel re: diarrhea

## 2023-06-20 NOTE — Telephone Encounter (Signed)
[employer] form completed along with order placed in Epic and signature obtained.  Kit placed on 2nd floor for pickup. Left message for pt to call back

## 2023-06-20 NOTE — Telephone Encounter (Signed)
Pt came and picked up stool specimen kit on 2nd floor.

## 2023-06-21 NOTE — Telephone Encounter (Signed)
Inbound call from patient, states she turned in her test this morning and states she would like a call to follow up on Monday.

## 2023-06-26 NOTE — Progress Notes (Unsigned)
Cardiology Office Note:   Date:  06/29/2023  ID:  Felicia Odom, Felicia Odom June 26, 1956, MRN [id number] PCP: Ellyn Hack, MD  [facility] Providers Cardiologist:  Rollene Rotunda, MD Electrophysiologist:  Will Jorja Loa, MD {  History of Present Illness:   Felicia Odom is a 67 y.o. female who presents for evaluation of atrial fib.    She was in the ED and 03/25/2022 for this.  She was in atrial fib. She converted back to NSR after IV Cardizem drip.  She did not appear to be volume overloaded.  She was started on spironolactone to help with volume and hypokalemia.   Echo on 8/29 demonstrated normal LV function with mild AS.  Of note her event monitor which she wore in August demonstrated paroxysms of A-fib.  She has sinus bradycardia with pauses of 3 seconds as well.  She had nonsustained ventricular tachycardia up to 5 beats.  Her atrial rate with fibrillation averaged at 135.   She saw Dr. Elberta Fortis and now is status post ablation.       Since I saw her her she is having constant diarrhea.  She has had a colonoscopy.  She is having lots of pain and it is mostly around her lower abdomen.  Some of the radiates or is up under her right breast.  It is a constant dull 5 out of 10 discomfort.  It helps if she presses on her chest.  She has some right upper chest discomfort that goes between her shoulder blades.  She has been in contact with GI.  She is quite anxious and tearful because of this.  She has lost some weight.  She has been able to do some activities such as walking on a treadmill which she does a couple of times a week and this does not bring on any of the discomfort.  She is not thinking that she is having any tachypalpitations.  She is not having any new shortness of breath, PND or orthopnea.  She has had no weight gain or edema.  She has had no cough fevers or chills.  She is not describing any GI bleeding.    ROS: As stated in the HPI and negative for all other  systems.  Studies Reviewed:    EKG:   EKG Interpretation Date/Time:  Thursday June 29 2023 08:06:10 EST Ventricular Rate:  77 PR Interval:  138 QRS Duration:  84 QT Interval:  404 QTC Calculation: 457 R Axis:   -27  Text Interpretation: Normal sinus rhythm Cannot rule out Anterior infarct , age undetermined When compared with ECG of 23-May-2023 08:09, No significant change was found Confirmed by Rollene Rotunda ([id number]) on 06/29/2023 8:07:10 AM    Risk Assessment/Calculations:        Physical Exam:   VS:  BP 128/68 (BP Location: Left Arm, Patient Position: Sitting)   Pulse 76   Ht 5\' 5"  (1.651 m)   Wt 186 lb 6.4 oz (84.6 kg)   BMI 31.02 kg/m    Wt Readings from Last 3 Encounters:  06/29/23 186 lb 6.4 oz (84.6 kg)  05/30/23 191 lb 6.4 oz (86.8 kg)  05/25/23 190 lb 6.4 oz (86.4 kg)     GEN: Well nourished, well developed in no acute distress NECK: No JVD; No carotid bruits CARDIAC: RRR, 2 out of 6 right upper sternal border brief systolic murmur, no diastolic murmurs, rubs, gallops RESPIRATORY:  Clear to auscultation without rales, wheezing or rhonchi  ABDOMEN:  Soft, non-tender, non-distended EXTREMITIES:  No edema; No deformity   ASSESSMENT AND PLAN:   Atrial fib with RVR:   She seems to be maintaining sinus rhythm.  She is tolerating the meds as listed.  I am going to for now continue anticoagulation since she is having some palpitations occasionally I cannot be absolutely sure she is not in and out of fibrillation.  I will consider stopping this in the future.  I will check a CBC this.  She can come back to see an APP and if she is not having any palpitations or any documented atrial fibrillation she would like to get rid of the DOAC.  Hypercoaguable state: The patient has no contraindications or problems taking anticoagulation.  No change in therapy.    Diarrhea: She sees GI tomorrow.  I am going to check electrolytes and liver profile.  Aortic atherosclerosis:   I  will check a lipid profile.  No change in therapy otherwise.   Murmur: She has mild aortic stenosis.     Follow up with me in 1 year  Signed, Rollene Rotunda, MD

## 2023-06-26 NOTE — Telephone Encounter (Signed)
PT is returning call to find out if her c.diff results were back. She is very concerned and distraught by all this. She says her BM "smell of cancer" and she is very scared. She has also heard from the referred doctor and they want her to begin exercises immediately but she doesn't know how with the constant diarrhea. Please advise.

## 2023-06-26 NOTE — Telephone Encounter (Signed)
Inbound call from patient, referring her stool as "liquid oil", she states it has changed colors and continues to get worse. Patient states she would like someone to call her soon in regards.
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
## 2023-06-27 ENCOUNTER — Other Ambulatory Visit: Payer: Self-pay

## 2023-06-27 ENCOUNTER — Ambulatory Visit: Payer: 59 | Attending: Obstetrics and Gynecology | Admitting: Physical Therapy

## 2023-06-27 ENCOUNTER — Encounter: Payer: Self-pay | Admitting: Physical Therapy

## 2023-06-27 DIAGNOSIS — M6281 Muscle weakness (generalized): Secondary | ICD-10-CM | POA: Insufficient documentation

## 2023-06-27 DIAGNOSIS — R279 Unspecified lack of coordination: Secondary | ICD-10-CM | POA: Diagnosis present

## 2023-06-27 NOTE — Telephone Encounter (Signed)
Imodium 1-2 tid prn for now Light diet Further plans per Dr Leone Payor who returns tomorrow.

## 2023-06-27 NOTE — Telephone Encounter (Addendum)
Pt stool results were viewed on the [employer] website. Results were negative. Left message for pt to call back

## 2023-06-27 NOTE — Telephone Encounter (Signed)
Inbound call from patient returning phone call. Requesting a call back. Please advise, thank you.  

## 2023-06-27 NOTE — Telephone Encounter (Signed)
Dr. Leone Payor Pt. Pt stated that she has been having on going loose/liquid stools for a while now. 7 so far this AM. Please see notes below.  Pt stated that the last several days the stools have been like motor oil consistency  and color.  Pt stool results were viewed on the [employer] website. Results were negative.   Pt was recommended once again to try OTC imodium to help with the frequency. Please review and advise ad DOD

## 2023-06-28 ENCOUNTER — Telehealth: Payer: Self-pay | Admitting: Internal Medicine

## 2023-06-28 DIAGNOSIS — R197 Diarrhea, unspecified: Secondary | ICD-10-CM

## 2023-06-28 MED ORDER — CHOLESTYRAMINE 4 G PO PACK: 4.0000 g | PACK | Freq: Every day | ORAL | 3 refills | Status: DC

## 2023-06-28 NOTE — Telephone Encounter (Signed)
No infection found in [employer] stool test - includes C diff  Was reviewed yesterday  She was told to try Imodium AD  I have pended an Rx for cholestyramine for her to use- it can help her diarrhea a lot  She can still use Imodium AD also

## 2023-06-28 NOTE — Telephone Encounter (Signed)
Pt was scheduled for an office visit to see Dr. Leone Payor on 06/30/2023 at 1:30 PM.  Pt was made aware of Dr. Leone Payor recommendations and prescription that was sent to pharmacy: Pt verbalized understanding with all questions answered.

## 2023-06-28 NOTE — Telephone Encounter (Signed)
I called her and explained that we had sent in the cholestyramine She will start it tomorrow and see me 12/6

## 2023-06-28 NOTE — Telephone Encounter (Signed)
Pt stated that the imodium is not working at all. Pt stated that she has had 7 BM thus far today. Requesting relief. Please see notes below.  Please review and advise

## 2023-06-29 ENCOUNTER — Ambulatory Visit: Payer: 59 | Attending: Cardiology | Admitting: Cardiology

## 2023-06-29 ENCOUNTER — Encounter: Payer: Self-pay | Admitting: Cardiology

## 2023-06-29 VITALS — BP 128/68 | HR 76 | Ht 65.0 in | Wt 186.4 lb

## 2023-06-29 DIAGNOSIS — I4891 Unspecified atrial fibrillation: Secondary | ICD-10-CM | POA: Diagnosis not present

## 2023-06-29 DIAGNOSIS — I700 Atherosclerosis of aorta: Secondary | ICD-10-CM | POA: Diagnosis not present

## 2023-06-29 DIAGNOSIS — E876 Hypokalemia: Secondary | ICD-10-CM

## 2023-06-29 DIAGNOSIS — R072 Precordial pain: Secondary | ICD-10-CM

## 2023-06-29 NOTE — Patient Instructions (Signed)
Medication Instructions:  No changes.  *If you need a refill on your cardiac medications before your next appointment, please call your pharmacy*   Lab Work: LIPID, CBC, CMET today. If you have labs (blood work) drawn today and your tests are completely normal, you will receive your results only by: MyChart Message (if you have MyChart) OR A paper copy in the mail If you have any lab test that is abnormal or we need to change your treatment, we will call you to review the results.   Follow-Up: At [facility], you and your health needs are our priority.  As part of our continuing mission to provide you with exceptional heart care, we have created designated Provider Care Teams.  These Care Teams include your primary Cardiologist (physician) and Advanced Practice Providers (APPs -  Physician Assistants and Nurse Practitioners) who all work together to provide you with the care you need, when you need it.  Your next appointment:   3 month(s)  Provider:   First available APP.

## 2023-06-30 ENCOUNTER — Encounter: Payer: Self-pay | Admitting: Internal Medicine

## 2023-06-30 ENCOUNTER — Ambulatory Visit (INDEPENDENT_AMBULATORY_CARE_PROVIDER_SITE_OTHER): Payer: 59 | Admitting: Internal Medicine

## 2023-06-30 ENCOUNTER — Telehealth: Payer: Self-pay | Admitting: Internal Medicine

## 2023-06-30 VITALS — BP 108/60 | HR 80 | Ht 65.0 in | Wt 189.1 lb

## 2023-06-30 DIAGNOSIS — R159 Full incontinence of feces: Secondary | ICD-10-CM | POA: Diagnosis not present

## 2023-06-30 DIAGNOSIS — K580 Irritable bowel syndrome with diarrhea: Secondary | ICD-10-CM

## 2023-06-30 DIAGNOSIS — R152 Fecal urgency: Secondary | ICD-10-CM | POA: Diagnosis not present

## 2023-06-30 LAB — COMPREHENSIVE METABOLIC PANEL WITH GFR
ALT: 16 [IU]/L (ref 0–32)
AST: 18 [IU]/L (ref 0–40)
Albumin: 4.2 g/dL (ref 3.9–4.9)
Alkaline Phosphatase: 92 [IU]/L (ref 44–121)
BUN/Creatinine Ratio: 13 (ref 12–28)
BUN: 10 mg/dL (ref 8–27)
Bilirubin Total: 0.4 mg/dL (ref 0.0–1.2)
CO2: 27 mmol/L (ref 20–29)
Calcium: 9.2 mg/dL (ref 8.7–10.3)
Chloride: 98 mmol/L (ref 96–106)
Creatinine, Ser: 0.79 mg/dL (ref 0.57–1.00)
Globulin, Total: 2.7 g/dL (ref 1.5–4.5)
Glucose: 115 mg/dL — ABNORMAL HIGH (ref 70–99)
Potassium: 3.6 mmol/L (ref 3.5–5.2)
Sodium: 140 mmol/L (ref 134–144)
Total Protein: 6.9 g/dL (ref 6.0–8.5)
eGFR: 82 mL/min/{1.73_m2}

## 2023-06-30 LAB — LIPID PANEL
Chol/HDL Ratio: 5.7 {ratio} — ABNORMAL HIGH (ref 0.0–4.4)
Cholesterol, Total: 243 mg/dL — ABNORMAL HIGH (ref 100–199)
HDL: 43 mg/dL (ref >39)
LDL Chol Calc (NIH): 170 mg/dL — ABNORMAL HIGH (ref 0–99)
Triglycerides: 162 mg/dL — ABNORMAL HIGH (ref 0–149)
VLDL Cholesterol Cal: 30 mg/dL (ref 5–40)

## 2023-06-30 LAB — CBC
Hematocrit: 47.5 % — ABNORMAL HIGH (ref 34.0–46.6)
Hemoglobin: 15.2 g/dL (ref 11.1–15.9)
MCH: 28.8 pg (ref 26.6–33.0)
MCHC: 32 g/dL (ref 31.5–35.7)
MCV: 90 fL (ref 79–97)
Platelets: 292 10*3/uL (ref 150–450)
RBC: 5.28 x10E6/uL (ref 3.77–5.28)
RDW: 13.5 % (ref 11.7–15.4)
WBC: 8 10*3/uL (ref 3.4–10.8)

## 2023-06-30 MED ORDER — RIFAXIMIN 550 MG PO TABS: 550.0000 mg | ORAL_TABLET | Freq: Three times a day (TID) | ORAL | 0 refills | Status: DC

## 2023-06-30 NOTE — Patient Instructions (Addendum)
We have sent the following medications to your pharmacy for you to pick up at your convenience: Xifaxan, if to expensive call us back.   After you complete the xifaxan let us know how you are doing.   Take your Vitamin D and your cholestyramine.  _______________________________________________________  If your blood pressure at your visit was 140/90 or greater, please contact your primary care physician to follow up on this.  _______________________________________________________  If you are age 65 or older, your body mass index should be between 23-30. Your Body mass index is 31.47 kg/m. If this is out of the aforementioned range listed, please consider follow up with your Primary Care Provider.  If you are age 41 or younger, your body mass index should be between 19-25. Your Body mass index is 31.47 kg/m. If this is out of the aformentioned range listed, please consider follow up with your Primary Care Provider.   ________________________________________________________  The [facility] providers would like to encourage you to use [facility] to communicate with providers for non-urgent requests or questions.  Due to long hold times on the telephone, sending your provider a message by [facility] may be a faster and more efficient way to get a response.  Please allow 48 business hours for a response.  Please remember that this is for non-urgent requests.  _______________________________________________________   I appreciate the opportunity to care for you. Stan Head, MD, [facility]

## 2023-06-30 NOTE — Telephone Encounter (Signed)
Patient called and stated that she went to her [facility] pharmacy to pick up her Antibiotic and she was told by the pharmacist that if we call her insurance and tell them why she needs the Antibiotic, they should be able to cover it. Please advise

## 2023-06-30 NOTE — Telephone Encounter (Signed)
I spoke with the [facility] pharmacy and they said her rifaximin needs a prior authorization. I called Elan and told her we will forward this to our PA team to work on.

## 2023-06-30 NOTE — Progress Notes (Signed)
Chief Complaint: Diarrhea, abd pain  HPI: 67 year old female patient of Felicia Hack, MD that presents to clinic for follow-up on diarrhea and abdominal pain.    Interval history:  Patient reports after her colonoscopy in September 2024 she has experienced loose stools that are explosive and have a foul smell. She has had accidents where she passes gas and stool leaks out. Before starting the cholestyramine yesterday she was having over 10 loose stools (small to moderate amount) per day, however today she has only had 5 bowel movements. She reports the RLQ pain has also improved some with the medication. No gas and bloating. No rectal bleeding. She is also taking OTC Imodium 2- 4 tablets per day prn, but she reports she has not needed it last 24 hours. Additionally, she started pelvic floor therapy and reports this has helped with defecation.   Wt Readings from Last 3 Encounters:  06/30/23 189 lb 2 oz (85.8 kg)  06/29/23 186 lb 6.4 oz (84.6 kg)  05/30/23 191 lb 6.4 oz (86.8 kg)    Past Medical History:  Diagnosis Date   Anxiety    Aortic atherosclerosis (HCC) 10/06/2021   Arthritis    neck   Cannabis dependence with current use (HCC) 07/26/2016   Chronic back pain    Chronic bronchitis (HCC)    Chronic diastolic CHF (congestive heart failure) (HCC)    Chronic neck pain    Cocaine abuse (HCC) 04/05/2021   COPD mixed type (HCC) 02/18/2019   Office Spirometry 10/21/2015-slight restriction of exhaled volume, mild obstruction. FVC 2.69/78%, FEV1 2.04/76%, FEV1/FVC 0.76, FEF 25-75 percent 1.68/65% PFT 04/27/2016-minimal obstructive airways disease, minimal diffusion defect, insignificant response to bronchodilator. FVC 2.79/78%, FEV1 2.11/76%, ratio 0.76, FEF 25-75 percent 1.81/72%, TLC 92%, DLCO 76%.   DDD (degenerative disc disease), cervical 06/15/2015   Diverticulosis    Eczema 11/29/2017   Encounter for long-term opiate analgesic use 07/06/2017   Essential hypertension  09/25/2015   GERD (gastroesophageal reflux disease)    takes Nexium daily   Helicobacter pylori gastritis 08/2019   Tx and eradicated   Hyperlipidemia    not on any meds bc refused to pick up from pharmacy   Long-term current use of opiate analgesic 04/05/2021   Moderate episode of recurrent major depressive disorder (HCC) 01/28/2020   Obesity (BMI 30.0-34.9) 12/16/2021   Obsessive-compulsive disorder 06/08/2007   Qualifier: Diagnosis of  By: Jerolyn Shin     PAF (paroxysmal atrial fibrillation) (HCC) 12/16/2021   PANIC DISORDER 06/08/2007   Qualifier: Diagnosis of  By: Jerolyn Shin     Pneumonia    2016   PONV (postoperative nausea and vomiting)    Primary osteoarthritis involving multiple joints 01/21/2015   Seborrheic dermatitis of scalp 02/11/2015   Seizures (HCC)    in the 80's   Thyroid nodule 07/30/2020   IMPRESSION: No significant interval change of bilateral thyroid nodules.   The above is in keeping with the ACR TI-RADS recommendations - J Am Coll Radiol 2017;14:587-595.     Electronically Signed   By: Acquanetta Belling M.D.   On: 08/17/2020 14:18   Type II diabetes mellitus with manifestations (HCC) 02/18/2019   The 10-year ASCVD risk score Denman George DC Jr., et al., 2013) is: 9.1%   Values used to calculate the score:     Age: 19 years     Sex: Female     Is Non-Hispanic African American: No     Diabetic: Yes  Tobacco smoker: No     Systolic Blood Pressure: 138 mmHg     Is BP treated: No     HDL Cholesterol: 46.2 mg/dL     Total Cholesterol: 180 mg/dL   Past Surgical History:  Procedure Laterality Date   ATRIAL FIBRILLATION ABLATION N/A 09/08/2022   Procedure: ATRIAL FIBRILLATION ABLATION;  Surgeon: Regan Lemming, MD;  Location: [facility];  Service: Cardiovascular;  Laterality: N/A;   COLONOSCOPY     ECTOPIC PREGNANCY SURGERY     x 2    ESOPHAGOGASTRODUODENOSCOPY     LAPAROSCOPIC LYSIS INTESTINAL ADHESIONS     x 2   RESECTION OF MEDIASTINAL MASS N/A  06/10/2016   Procedure: RESECTION OF MEDIASTINAL MASS;  Surgeon: Delight Ovens, MD;  Location: [facility] OR;  Service: Thoracic;  Laterality: N/A;   VIDEO ASSISTED THORACOSCOPY Right 06/10/2016   Procedure: VIDEO ASSISTED THORACOSCOPY WITH PLACEMENT OF ON Q TUNNELER;  Surgeon: Delight Ovens, MD;  Location: [facility] OR;  Service: Thoracic;  Laterality: Right;   VIDEO BRONCHOSCOPY N/A 06/10/2016   Procedure: VIDEO BRONCHOSCOPY;  Surgeon: Delight Ovens, MD;  Location: [facility] OR;  Service: Thoracic;  Laterality: N/A;   Allergies as of 06/30/2023 - Review Complete 06/30/2023  Allergen Reaction Noted   Sulfa antibiotics Hives and Swelling 04/04/2022   Family History  Problem Relation Age of Onset   Heart attack Father    Prostate cancer Father    Heart failure Father    Diabetes Mother    Clotting disorder Mother        PE   Heart failure Mother    Kidney disease Mother    Breast cancer Sister    Colon cancer Sister    Esophageal cancer Neg Hx    Rectal cancer Neg Hx    Stomach cancer Neg Hx    Review of Systems:    Constitutional: No weight loss, fever, chills, weakness or fatigue HEENT: Eyes: No change in vision               Ears, Nose, Throat:  No change in hearing or congestion Skin: No rash or itching Cardiovascular: No chest pain, chest pressure or palpitations   Respiratory: No SOB or cough Gastrointestinal: See HPI and otherwise negative Genitourinary: No dysuria or change in urinary frequency Neurological: No headache, dizziness or syncope Musculoskeletal: No new muscle or joint pain Hematologic: No bleeding or bruising Psychiatric: No history of depression or anxiety  Physical Exam:  Vital signs: BP 108/60 (BP Location: Left Arm, Patient Position: Sitting, Cuff Size: Normal)   Pulse 80   Ht 5\' 5"  (1.651 m)   Wt 189 lb 2 oz (85.8 kg)   BMI 31.47 kg/m  Constitutional:   Pleasant Caucasian female appears to be in NAD, Well developed, Well nourished, alert and  cooperative Respiratory: Respirations even and unlabored. Lungs clear to auscultation bilaterally.   No wheezes, crackles, or rhonchi.  Cardiovascular: Normal S1, S2. No MRG. Regular rate and rhythm. No peripheral edema, cyanosis or pallor.  Gastrointestinal:  Soft, nondistended, nontender. No rebound or guarding. Hyperactive bowel sounds. No appreciable masses or hepatomegaly. Rectal: Normal external rectal exam, normal rectal tone, no internal hemorrhoids appreciated, no masses, non tender, brown stool  Skin:   Dry and intact without significant lesions or rashes. Psychiatric: Oriented to person, place and time. Demonstrates good judgement and reason without abnormal affect or behaviors.  RELEVANT LABS AND IMAGING: CBC     Latest Ref Rng & Units  06/29/2023    8:36 AM 05/23/2023    9:04 AM 08/25/2022    9:11 AM  CBC  WBC 3.4 - 10.8 x10E3/uL 8.0  7.4  5.9   Hemoglobin 11.1 - 15.9 g/dL 16.1  09.6  04.5   Hematocrit 34.0 - 46.6 % 47.5  47.9  43.5   Platelets 150 - 450 x10E3/uL 292  288  269     CMP     Component Value Date/Time   NA 140 06/29/2023 0836   K 3.6 06/29/2023 0836   CL 98 06/29/2023 0836   CO2 27 06/29/2023 0836   GLUCOSE 115 (H) 06/29/2023 0836   GLUCOSE 200 (H) 04/04/2022 1816   BUN 10 06/29/2023 0836   CREATININE 0.79 06/29/2023 0836   CALCIUM 9.2 06/29/2023 0836   PROT 6.9 06/29/2023 0836   ALBUMIN 4.2 06/29/2023 0836   AST 18 06/29/2023 0836   ALT 16 06/29/2023 0836   ALKPHOS 92 06/29/2023 0836   BILITOT 0.4 06/29/2023 0836   GFRNONAA >60 04/04/2022 1816   GFRAA >60 06/12/2016 0343   Labs on 06/30/2023 show: BUN 10/creatinine 0.79, normal electrolytes and LFTs, WBC 8, hemoglobin 15.2, platelets 292 06/2023 Diatherex stool test- includes C diff- negative  09/19/2019 colonoscopy Impression:  - One 10 mm polyp in the transverse colon, removed with a cold snare and removed piecemeal using a hot snare. Resected and retrieved. - Diverticulosis in the sigmoid colon.   - The examined portion of the ileum was normal.  - The examination was otherwise normal on direct and retroflexion views. Path:3. Surgical [P], random sites - COLONIC MUCOSA WITH NO SIGNIFICANT HISTOPATHOLOGIC CHANGES. - NO MICROSCOPIC COLITIS, ACTIVE INFLAMMATION OR CHRONIC CHANGES. 4. Surgical [P], colon, transverse, polyp - SESSILE SERRATED POLYP(S) WITHOUT CYTOLOGIC DYSPLASIA. - NO EVIDENCE OF CARCINOMA. 04/13/2023 colonoscopy Impression:  - Decreased voluntary sphincter tone, small rectocele and exagerated descent found on digital rectal exam. She has fecal incontinence issues and chronic loose stools ( negative colon biopsies 2021)  - One 15 mm polyp in the transverse colon, removed piecemeal using a cold snare. Resected and retrieved.  - Diverticulosis in the sigmoid colon.  - The examination was otherwise normal on direct and retroflexion views. - Personal history of colonic polyps. 10 mm ssp 2021 Path: FINAL DIAGNOSIS       1. Surgical [P], colon, transverse, polyp (1) :       - SESSILE SERRATED POLYP WITHOUT CYTOLOGIC DYSPLASIA, INFLAMED.       - NEGATIVE FOR MALIGNANCY.   Assessment: 67 year old female patient that presents for follow-up of diarrhea with abdominal pain. She meets the Rome Criteria IV of IBS-D and has responded to starting the cholestyramine once daily. She may have some dysbiosis /intestinal bacteria with reported foul smelling stools, will give her 2 weeks of Xifaxan and reevaluate. She will also continue the Imodium prn. Stool tests negative for cdiff or other enteric infections.  DRE negative for fecal impaction. Recent colonoscopy on file.  Encounter Diagnoses  Name Primary?   Irritable bowel syndrome with diarrhea Yes   Incontinence of feces with fecal urgency    Plan: -continue cholestyramine once daily at noon -continue imodium as needed -Xifaxan 550mg  TID for 14 days -continue pelvic floor therapy  Deanna May, FNP-C [facility] 06/30/2023, 1:30 PM  Cc: Felicia Hack, MD   Ms. May served as a Neurosurgeon for this visit.  Iva Boop, MD, [facility] 06/30/2023 4:54 PM

## 2023-07-01 ENCOUNTER — Telehealth: Payer: Self-pay | Admitting: Nurse Practitioner

## 2023-07-01 ENCOUNTER — Emergency Department (HOSPITAL_COMMUNITY): Payer: 59

## 2023-07-01 ENCOUNTER — Encounter (HOSPITAL_COMMUNITY): Payer: Self-pay | Admitting: *Deleted

## 2023-07-01 ENCOUNTER — Other Ambulatory Visit: Payer: Self-pay

## 2023-07-01 ENCOUNTER — Emergency Department (HOSPITAL_COMMUNITY)
Admission: EM | Admit: 2023-07-01 | Discharge: 2023-07-01 | Disposition: A | Discharge status: Home / Self Care | Payer: 59 | Attending: Emergency Medicine | Admitting: Emergency Medicine

## 2023-07-01 DIAGNOSIS — R1084 Generalized abdominal pain: Secondary | ICD-10-CM | POA: Insufficient documentation

## 2023-07-01 DIAGNOSIS — E860 Dehydration: Secondary | ICD-10-CM | POA: Insufficient documentation

## 2023-07-01 DIAGNOSIS — F419 Anxiety disorder, unspecified: Secondary | ICD-10-CM | POA: Insufficient documentation

## 2023-07-01 DIAGNOSIS — R109 Unspecified abdominal pain: Secondary | ICD-10-CM | POA: Diagnosis present

## 2023-07-01 DIAGNOSIS — E876 Hypokalemia: Secondary | ICD-10-CM | POA: Insufficient documentation

## 2023-07-01 DIAGNOSIS — Z6831 Body mass index (BMI) 31.0-31.9, adult: Secondary | ICD-10-CM | POA: Insufficient documentation

## 2023-07-01 DIAGNOSIS — E669 Obesity, unspecified: Secondary | ICD-10-CM | POA: Diagnosis not present

## 2023-07-01 DIAGNOSIS — R197 Diarrhea, unspecified: Secondary | ICD-10-CM | POA: Diagnosis not present

## 2023-07-01 DIAGNOSIS — J449 Chronic obstructive pulmonary disease, unspecified: Secondary | ICD-10-CM | POA: Insufficient documentation

## 2023-07-01 LAB — GASTROINTESTINAL PANEL BY PCR, STOOL (REPLACES STOOL CULTURE)

## 2023-07-01 LAB — COMPREHENSIVE METABOLIC PANEL
ALT: 19 U/L (ref 0–44)
AST: 21 U/L (ref 15–41)
Albumin: 3.5 g/dL (ref 3.5–5.0)
Alkaline Phosphatase: 69 U/L (ref 38–126)
Anion gap: 13 (ref 5–15)
BUN: 11 mg/dL (ref 8–23)
CO2: 24 mmol/L (ref 22–32)
Calcium: 9 mg/dL (ref 8.9–10.3)
Chloride: 100 mmol/L (ref 98–111)
Creatinine, Ser: 0.95 mg/dL (ref 0.44–1.00)
GFR, Estimated: >60 mL/min (ref >60)
Glucose, Bld: 122 mg/dL — ABNORMAL HIGH (ref 70–99)
Potassium: 3 mmol/L — ABNORMAL LOW (ref 3.5–5.1)
Sodium: 137 mmol/L (ref 135–145)
Total Bilirubin: 0.8 mg/dL (ref <1.2)
Total Protein: 7 g/dL (ref 6.5–8.1)

## 2023-07-01 LAB — CBC WITH DIFFERENTIAL/PLATELET
Abs Immature Granulocytes: 0.02 10*3/uL (ref 0.00–0.07)
Basophils Absolute: 0 10*3/uL (ref 0.0–0.1)
Basophils Relative: 1 %
Eosinophils Absolute: 0 10*3/uL (ref 0.0–0.5)
Eosinophils Relative: 0 %
HCT: 42.2 % (ref 36.0–46.0)
Hemoglobin: 14.7 g/dL (ref 12.0–15.0)
Immature Granulocytes: 0 %
Lymphocytes Relative: 21 %
Lymphs Abs: 1.5 10*3/uL (ref 0.7–4.0)
MCH: 29.2 pg (ref 26.0–34.0)
MCHC: 34.8 g/dL (ref 30.0–36.0)
MCV: 83.9 fL (ref 80.0–100.0)
Monocytes Absolute: 1 10*3/uL (ref 0.1–1.0)
Monocytes Relative: 14 %
Neutro Abs: 4.6 10*3/uL (ref 1.7–7.7)
Neutrophils Relative %: 64 %
Platelets: 302 10*3/uL (ref 150–400)
RBC: 5.03 MIL/uL (ref 3.87–5.11)
RDW: 13.2 % (ref 11.5–15.5)
WBC: 7.2 10*3/uL (ref 4.0–10.5)
nRBC: 0 % (ref 0.0–0.2)

## 2023-07-01 LAB — URINALYSIS, ROUTINE W REFLEX MICROSCOPIC
Bilirubin Urine: NEGATIVE
Glucose, UA: NEGATIVE mg/dL
Hgb urine dipstick: NEGATIVE
Ketones, ur: 5 mg/dL — AB
Leukocytes,Ua: NEGATIVE
Nitrite: NEGATIVE
Protein, ur: NEGATIVE mg/dL
Specific Gravity, Urine: >1.046 — ABNORMAL HIGH (ref 1.005–1.030)
pH: 5 (ref 5.0–8.0)

## 2023-07-01 LAB — MAGNESIUM: Magnesium: 2 mg/dL (ref 1.7–2.4)

## 2023-07-01 LAB — LIPASE, BLOOD: Lipase: 24 U/L (ref 11–51)

## 2023-07-01 MED ORDER — HYDROCODONE-ACETAMINOPHEN 5-325 MG PO TABS: 1.0000 | ORAL_TABLET | ORAL | 0 refills | Status: DC | PRN

## 2023-07-01 MED ORDER — SODIUM CHLORIDE 0.9 % IV BOLUS
1000.0000 mL | Freq: Once | INTRAVENOUS | Status: AC
  Administered 2023-07-01: 1000 mL via INTRAVENOUS

## 2023-07-01 MED ORDER — ONDANSETRON 4 MG PO TBDP: 4.0000 mg | ORAL_TABLET | Freq: Three times a day (TID) | ORAL | 0 refills | Status: DC | PRN

## 2023-07-01 MED ORDER — MORPHINE SULFATE (PF) 4 MG/ML IV SOLN
4.0000 mg | Freq: Once | INTRAVENOUS | Status: AC
  Administered 2023-07-01: 4 mg via INTRAVENOUS
  Filled 2023-07-01: qty 1

## 2023-07-01 MED ORDER — POTASSIUM CHLORIDE CRYS ER 20 MEQ PO TBCR
40.0000 meq | EXTENDED_RELEASE_TABLET | Freq: Once | ORAL | Status: AC
  Administered 2023-07-01: 40 meq via ORAL
  Filled 2023-07-01: qty 2

## 2023-07-01 MED ORDER — IOHEXOL 350 MG/ML SOLN
75.0000 mL | Freq: Once | INTRAVENOUS | Status: AC | PRN
  Administered 2023-07-01: 75 mL via INTRAVENOUS

## 2023-07-01 NOTE — ED Notes (Signed)
CT here. Pt urgently up to [facility] a 2nd time.

## 2023-07-01 NOTE — ED Notes (Signed)
Pt on BSC. EDP into room, at [facility].

## 2023-07-01 NOTE — ED Triage Notes (Signed)
BIB [facility] from home for abd pain and distention, ongoing abd issues since July, colonoscopy in October with polypectomy, endorses nausea, watery black stool yesterday, no BM today, no vomiting. Mentions frequent BMs. Up to [facility] after arrival. EDP present on arrival. Pt alert, NAD, calm, interactive, resps shallow and tachypneic, anxious, skin clammy. VSS. EMS gave fentanyl and 4mg  zofran, NSL 18g, L AC. VSS. CBG 200. Friend to BS.

## 2023-07-01 NOTE — Telephone Encounter (Signed)
Patient called our answering service, received message stated patient was having major bowel issues and  was in the process of calling an ambulance to go to [facility] ED. I attempted to call the patient at this time at [phone] but she did not answer. Await ED evaluation.   Patient was seen by Charmaine Downs NP with Dr. Leone Payor in our outpatient GI clinic yesterday with diarrhea and abdominal pain, prescribed Xifaxan, Cholestyramine and Imodium.

## 2023-07-01 NOTE — ED Provider Notes (Signed)
[place] EMERGENCY DEPARTMENT AT [facility] Provider Note   CSN: [id number] Arrival date & time: 07/01/23  4401     History  Chief Complaint  Patient presents with   Abdominal Pain    Felicia Odom is a 67 y.o. female.  Pt is a 67 yo female with pmhx significant for arthritis, hld, gerd, copd, and IBS.  Pt has been having abd pain for several days.  She did see GI (Dr. Leone Payor) yesterday and he thinks she has IBS.  He gave her a rx for Xifaxan, but her insurance needs a prior authorization, so she has not started that yet.  She had a neg test for c. Diff yesterday.  Pt said pain is worse.  She feels like her stomach is going to explode.  She has nausea.  EMS gave her fentanyl and zofran en route.          Home Medications Prior to Admission medications   Medication Sig Start Date End Date Taking? Authorizing Provider  HYDROcodone-acetaminophen (NORCO/VICODIN) 5-325 MG tablet Take 1 tablet by mouth every 4 (four) hours as needed. 07/01/23  Yes Jacalyn Lefevre, MD  ondansetron (ZOFRAN-ODT) 4 MG disintegrating tablet Take 1 tablet (4 mg total) by mouth every 8 (eight) hours as needed. 07/01/23  Yes Jacalyn Lefevre, MD  ACCU-CHEK GUIDE test strip USE 1 TO CHECK BLOOD SUGARS ONCE A WEEK 11/25/22   [provider]  acetaminophen (TYLENOL) 500 MG tablet Take 1,000 mg by mouth every 6 (six) hours as needed for fever or headache.    [provider]  albuterol (PROVENTIL) (2.5 MG/3ML) 0.083% nebulizer solution USE 1 VIAL IN NEBULIZER EVERY 6 HOURS - And As Needed 04/12/23   Jetty Duhamel D, MD  albuterol (VENTOLIN HFA) 108 (90 Base) MCG/ACT inhaler INHALE 1 TO 2 PUFFS INTO THE LUNGS EVERY 4 HOURS AS NEEDED FOR WHEEZING OR SHORTNESS OF BREATH 04/20/23   Young, Joni Fears D, MD  ALPRAZolam (XANAX) 1 MG tablet TAKE 1 TABLET (1 MG TOTAL) BY MOUTH EVERY 6 (SIX) HOURS AS NEEDED. FOR ANXIETY 03/15/23   Jetty Duhamel D, MD  cholestyramine Lanetta Inch) 4 g packet Take 1  packet (4 g total) by mouth daily with lunch. 06/28/23   Iva Boop, MD  clindamycin (CLEOCIN T) 1 % external solution Apply 1 Application topically as needed. 06/08/23   [provider]  diltiazem (CARDIZEM CD) 120 MG 24 hr capsule TAKE 1 CAPSULE BY MOUTH EVERY DAY 05/04/23   Rollene Rotunda, MD  diltiazem (CARDIZEM) 30 MG tablet TAKE 1 TABLET (30 MG TOTAL) BY MOUTH EVERY 4 (FOUR) HOURS AS NEEDED (ELEVATED HEART RATE). 04/14/23   Rollene Rotunda, MD  ELIQUIS 5 MG TABS tablet TAKE 1 TABLET BY MOUTH TWICE A DAY 04/14/23   Revankar, Aundra Dubin, MD  esomeprazole (NEXIUM) 20 MG capsule Take 20 mg by mouth daily as needed (acid reflux). 08/19/22   [provider]  furosemide (LASIX) 40 MG tablet Take 1 tablet by mouth twice daily 02/10/23   Rollene Rotunda, MD  Multiple Vitamin (MULTIVITAMIN) tablet Take 1 tablet by mouth daily.    [provider]  rifaximin (XIFAXAN) 550 MG TABS tablet Take 1 tablet (550 mg total) by mouth 3 (three) times daily. 06/30/23   Iva Boop, MD  rosuvastatin (CRESTOR) 5 MG tablet Take 1 tablet (5 mg total) by mouth every other day. 12/26/22 06/30/23  Rollene Rotunda, MD  valACYclovir (VALTREX) 1000 MG tablet as needed. 11/21/22   [provider]  Vitamin D, Ergocalciferol, (DRISDOL) 1.25 MG (50000 UNIT) CAPS capsule Take 50,000 Units by mouth every 7 (seven) days. 11/21/22   [provider]      Allergies    Sulfa antibiotics    Review of Systems   Review of Systems  Gastrointestinal:  Positive for abdominal pain and nausea.  All other systems reviewed and are negative.   Physical Exam Updated Vital Signs BP 108/67   Pulse 66   Temp 98.7 F (37.1 C) (Oral)   Resp 15   Wt 85.7 kg   SpO2 97%   BMI 31.45 kg/m  Physical Exam Vitals and nursing note reviewed.  Constitutional:      Appearance: She is well-developed. She is obese.  HENT:     Head: Normocephalic and atraumatic.     Mouth/Throat:     Mouth: Mucous  membranes are moist.     Pharynx: Oropharynx is clear.  Eyes:     Extraocular Movements: Extraocular movements intact.     Pupils: Pupils are equal, round, and reactive to light.  Cardiovascular:     Rate and Rhythm: Normal rate and regular rhythm.     Heart sounds: Normal heart sounds.  Pulmonary:     Effort: Pulmonary effort is normal.     Breath sounds: Normal breath sounds.  Abdominal:     General: Abdomen is flat. There is distension.     Palpations: Abdomen is soft.     Tenderness: There is generalized abdominal tenderness.  Skin:    General: Skin is warm.     Capillary Refill: Capillary refill takes less than 2 seconds.  Neurological:     General: No focal deficit present.     Mental Status: She is alert and oriented to person, place, and time.  Psychiatric:        Mood and Affect: Mood is anxious.     ED Results / Procedures / Treatments   Labs (all labs ordered are listed, but only abnormal results are displayed) Labs Reviewed  COMPREHENSIVE METABOLIC PANEL - Abnormal; Notable for the following components:      Result Value   Potassium 3.0 (*)    Glucose, Bld 122 (*)    All other components within normal limits  URINALYSIS, ROUTINE W REFLEX MICROSCOPIC - Abnormal; Notable for the following components:   Specific Gravity, Urine >1.046 (*)    Ketones, ur 5 (*)    All other components within normal limits  GASTROINTESTINAL PANEL BY PCR, STOOL (REPLACES STOOL CULTURE)  CBC WITH DIFFERENTIAL/PLATELET  LIPASE, BLOOD  MAGNESIUM    EKG None  Radiology CT ABDOMEN PELVIS W CONTRAST  Result Date: 07/01/2023 CLINICAL DATA:  Acute abdominal pain. Nonlocalized. Nausea. Watery black stool yesterday. EXAM: CT ABDOMEN AND PELVIS WITH CONTRAST TECHNIQUE: Multidetector CT imaging of the abdomen and pelvis was performed using the standard protocol following bolus administration of intravenous contrast. RADIATION DOSE REDUCTION: This exam was performed according to the  departmental dose-optimization program which includes automated exposure control, adjustment of the mA and/or kV according to patient size and/or use of iterative reconstruction technique. CONTRAST:  75mL OMNIPAQUE IOHEXOL 350 MG/ML SOLN COMPARISON:  07/06/2021 FINDINGS: Lower chest: Bibasilar scarring. Normal heart size without pericardial or pleural effusion. Hepatobiliary: Normal liver. Normal gallbladder, without biliary ductal dilatation. Pancreas: Fatty replaced pancreas. No duct dilatation or acute inflammation. Spleen: Normal in size, without focal abnormality. Adrenals/Urinary Tract: Normal adrenal glands. Interpolar right renal 1.0 cm hypoattenuating lesion is borderline too small to  characterize but new since 07/06/2021. Possible complexity within. Normal left kidney.  No hydronephrosis.  Normal urinary bladder. Stomach/Bowel: Normal stomach, without wall thickening. Fluid-filled colon, consistent with a diarrheal state. Normal terminal ileum. Normal appendix. Normal small bowel. Vascular/Lymphatic: Advanced aortic and branch vessel atherosclerosis. No abdominopelvic adenopathy. Reproductive: Normal uterus and adnexa. Other: No significant free fluid.  No free intraperitoneal air. Musculoskeletal: No acute osseous abnormality. IMPRESSION: 1. No acute process in the abdomen or pelvis. Fluid-filled colon suggests a diarrheal state. 2. Development of a 1.0 cm hypoattenuating right renal lesion which is borderline too small to characterize but may be complex. Recommend follow-up with pre and post contrast abdominal MRI at 6-12 months. 3.  Aortic Atherosclerosis (ICD10-I70.0). Electronically Signed   By: Jeronimo Greaves M.D.   On: 07/01/2023 10:45    Procedures Procedures    Medications Ordered in ED Medications  morphine (PF) 4 MG/ML injection 4 mg (4 mg Intravenous Given 07/01/23 0936)  iohexol (OMNIPAQUE) 350 MG/ML injection 75 mL (75 mLs Intravenous Contrast Given 07/01/23 1036)  sodium chloride 0.9 %  bolus 1,000 mL (0 mLs Intravenous Stopped 07/01/23 1239)  potassium chloride SA (KLOR-CON M) CR tablet 40 mEq (40 mEq Oral Given 07/01/23 1231)  morphine (PF) 4 MG/ML injection 4 mg (4 mg Intravenous Given 07/01/23 1232)    ED Course/ Medical Decision Making/ A&P                                 Medical Decision Making Amount and/or Complexity of Data Reviewed Labs: ordered. Radiology: ordered.  Risk Prescription drug management.   This patient presents to the ED for concern of abd pain, this involves an extensive number of treatment options, and is a complaint that carries with it a high risk of complications and morbidity.  The differential diagnosis includes diverticulitis, IBS, appendicitis, colitis   Co morbidities that complicate the patient evaluation  arthritis, hld, gerd, copd, and IBS   Additional history obtained:  Additional history obtained from epic chart review External records from outside source obtained and reviewed including EMS report   Lab Tests:  I Ordered, and personally interpreted labs.  The pertinent results include:  cbc nl, cmp with k low at 3.0, lip 24, mg nl, ua neg for uti, but + ketones   Imaging Studies ordered:  I ordered imaging studies including ct abd/pelvis  I independently visualized and interpreted imaging which showed  No acute process in the abdomen or pelvis. Fluid-filled colon  suggests a diarrheal state.  2. Development of a 1.0 cm hypoattenuating right renal lesion which  is borderline too small to characterize but may be complex.  Recommend follow-up with pre and post contrast abdominal MRI at 6-12  months.  3.  Aortic Atherosclerosis (ICD10-I70.0).   I agree with the radiologist interpretation   Cardiac Monitoring:  The patient was maintained on a cardiac monitor.  I personally viewed and interpreted the cardiac monitored which showed an underlying rhythm of: nsr   Medicines ordered and prescription drug  management:  I ordered medication including morphine/ivfs  for sx  Reevaluation of the patient after these medicines showed that the patient improved I have reviewed the patients home medicines and have made adjustments as needed   Test Considered:  ct   Critical Interventions:  Ivfs/pain and nausea control   Problem List / ED Course:  Abd pain and diarrhea:  ct neg.  Labs nl other  than hypokalemia which is likely from diarrhea.  She is feeling much better now and is tolerating fluids.  Dr. Marvell Fuller office is working on prior authorization for her Xifaxan.  Pt given a diet for IBS.  She is stable for d/c.  Return if worse.  Hypokalemia:  pt given a dose of kdur in ED.     Reevaluation:  After the interventions noted above, I reevaluated the patient and found that they have :improved   Social Determinants of Health:  Lives at home   Dispostion:  After consideration of the diagnostic results and the patients response to treatment, I feel that the patent would benefit from discharge with outpatient f/u.          Final Clinical Impression(s) / ED Diagnoses Final diagnoses:  Generalized abdominal pain  Diarrhea, unspecified type  Hypokalemia  Dehydration    Rx / DC Orders ED Discharge Orders          Ordered    HYDROcodone-acetaminophen (NORCO/VICODIN) 5-325 MG tablet  Every 4 hours PRN        07/01/23 1302    ondansetron (ZOFRAN-ODT) 4 MG disintegrating tablet  Every 8 hours PRN        07/01/23 1302              Jacalyn Lefevre, MD 07/01/23 1305

## 2023-07-01 NOTE — ED Notes (Signed)
Patient attemtpted to get urine however missed the cup

## 2023-07-01 NOTE — ED Notes (Addendum)
CT delayed again d/t frequent trips to [facility]

## 2023-07-03 ENCOUNTER — Ambulatory Visit: Payer: 59 | Admitting: Physical Therapy

## 2023-07-03 ENCOUNTER — Telehealth: Payer: Self-pay

## 2023-07-03 MED ORDER — DICYCLOMINE HCL 20 MG PO TABS: 20.0000 mg | ORAL_TABLET | Freq: Four times a day (QID) | ORAL | 1 refills | Status: DC | PRN

## 2023-07-03 NOTE — Telephone Encounter (Signed)
Odom, Felicia J, NP  Iva Boop, MD Caller: Unspecified (2 days ago,  8:17 AM) Dr. Leone Payor, Good morning, I spoke with patient this morning. She reports she continues with abdominal pain where the surgical site was in RLQ and LLQ. She is breaking hydrocodone in half and taking it which helps alittle. CT scan from ER visit was negative. She is still having loose stools. Pending PA for Xifaxan. If you would like I could send like a antispasmodic or hyoscyamine if you would like.  Felicia Odom, Odom        Patient was called as above  Will try dicyclomine  Meds ordered this encounter  Medications   dicyclomine (BENTYL) 20 MG tablet    Sig: Take 1 tablet (20 mg total) by mouth every 6 (six) hours as needed for spasms (abdominal pain).    Dispense:  90 tablet    Refill:  1   Felicia - please call her back and explain the Rx to her

## 2023-07-03 NOTE — Telephone Encounter (Signed)
Pt was contacted for a symptom update. Pt stated that she felt fine on Friday when she came to office visit to see Dr. Leone Payor but that evening she started to feel bad again and called EMS on Saturday. Pt stated that Sunday morning she felt better and then Sunday evening she started having the pain again. Pt stated that she is still having the diarrhea along with the abdominal pain.  Pt stated that she is waiting for the PA approval so that she can start the Xifaxan . PA team please assist with the PA.

## 2023-07-03 NOTE — Addendum Note (Signed)
Addended by: Stan Head E on: 07/03/2023 12:07 PM   Modules accepted: Orders

## 2023-07-03 NOTE — Telephone Encounter (Signed)
Patient called states she is still really sick uncontrollable bowel movements along with abdominal pain. Seeking further advise from Dr. Leone Payor as soon as possible.

## 2023-07-04 NOTE — Telephone Encounter (Addendum)
Inbound call from patient requesting a urgent phone call regarding prior auth for medication. Advised there are currently no updates. States she is still feeling very sick and having diarrhea. Please advise, thank you.

## 2023-07-04 NOTE — Telephone Encounter (Signed)
Inbound call from patient requesting an update regarding prior auth. States she still feels very sick. Please advise, thank you.

## 2023-07-05 ENCOUNTER — Other Ambulatory Visit: Payer: Self-pay | Admitting: Cardiology

## 2023-07-05 ENCOUNTER — Telehealth: Payer: Self-pay | Admitting: Pharmacy Technician

## 2023-07-05 ENCOUNTER — Ambulatory Visit
Admission: RE | Admit: 2023-07-05 | Discharge: 2023-07-05 | Disposition: A | Discharge status: Home / Self Care | Payer: 59 | Source: Ambulatory Visit | Attending: Student | Admitting: Student

## 2023-07-05 ENCOUNTER — Encounter: Payer: Self-pay | Admitting: Internal Medicine

## 2023-07-05 ENCOUNTER — Other Ambulatory Visit (HOSPITAL_COMMUNITY): Payer: Self-pay

## 2023-07-05 DIAGNOSIS — E2839 Other primary ovarian failure: Secondary | ICD-10-CM

## 2023-07-05 NOTE — Telephone Encounter (Signed)
Pharmacy Patient Advocate Encounter   Received notification from CoverMyMeds that prior authorization for XIFAXAN 550MG  is required/requested.   Insurance verification completed.   The patient is insured through [facility] .   Per test claim: PA required; PA submitted to above mentioned insurance via CoverMyMeds Key/confirmation #/EOC [facility] Status is pending

## 2023-07-05 NOTE — Telephone Encounter (Signed)
Pt pharmacy requesting refill on medication not prescribed by Dr Portage Lions Please address thank you

## 2023-07-05 NOTE — Telephone Encounter (Signed)
Pt stated that she is still having diarrhea. Pt was notified that we are still awaiting the approval for the xifaxan PA.  Pt was questioned how many imodium she is taking. Pt stated that she is taking on 3 imodium a day. Pt was notified that she could take up to six a day. Pt was notified to call back in AM with symptom update.

## 2023-07-06 ENCOUNTER — Emergency Department (HOSPITAL_COMMUNITY): Payer: 59

## 2023-07-06 ENCOUNTER — Encounter (HOSPITAL_COMMUNITY): Payer: Self-pay

## 2023-07-06 ENCOUNTER — Other Ambulatory Visit: Payer: Self-pay

## 2023-07-06 ENCOUNTER — Other Ambulatory Visit (HOSPITAL_COMMUNITY): Payer: Self-pay

## 2023-07-06 ENCOUNTER — Emergency Department (HOSPITAL_COMMUNITY)
Admission: EM | Admit: 2023-07-06 | Discharge: 2023-07-06 | Disposition: A | Discharge status: Home / Self Care | Payer: 59 | Attending: Emergency Medicine | Admitting: Emergency Medicine

## 2023-07-06 DIAGNOSIS — E876 Hypokalemia: Secondary | ICD-10-CM | POA: Insufficient documentation

## 2023-07-06 DIAGNOSIS — R1110 Vomiting, unspecified: Secondary | ICD-10-CM | POA: Insufficient documentation

## 2023-07-06 DIAGNOSIS — F419 Anxiety disorder, unspecified: Secondary | ICD-10-CM | POA: Diagnosis not present

## 2023-07-06 DIAGNOSIS — R197 Diarrhea, unspecified: Secondary | ICD-10-CM | POA: Diagnosis present

## 2023-07-06 DIAGNOSIS — E119 Type 2 diabetes mellitus without complications: Secondary | ICD-10-CM | POA: Diagnosis not present

## 2023-07-06 DIAGNOSIS — R110 Nausea: Secondary | ICD-10-CM | POA: Diagnosis not present

## 2023-07-06 DIAGNOSIS — Z7901 Long term (current) use of anticoagulants: Secondary | ICD-10-CM | POA: Diagnosis not present

## 2023-07-06 DIAGNOSIS — R1084 Generalized abdominal pain: Secondary | ICD-10-CM | POA: Insufficient documentation

## 2023-07-06 DIAGNOSIS — R0789 Other chest pain: Secondary | ICD-10-CM | POA: Insufficient documentation

## 2023-07-06 DIAGNOSIS — I5032 Chronic diastolic (congestive) heart failure: Secondary | ICD-10-CM | POA: Insufficient documentation

## 2023-07-06 DIAGNOSIS — R109 Unspecified abdominal pain: Secondary | ICD-10-CM

## 2023-07-06 DIAGNOSIS — I110 Hypertensive heart disease with heart failure: Secondary | ICD-10-CM | POA: Insufficient documentation

## 2023-07-06 DIAGNOSIS — K921 Melena: Secondary | ICD-10-CM

## 2023-07-06 LAB — BASIC METABOLIC PANEL
Anion gap: 16 — ABNORMAL HIGH (ref 5–15)
BUN: 7 mg/dL — ABNORMAL LOW (ref 8–23)
CO2: 23 mmol/L (ref 22–32)
Calcium: 9 mg/dL (ref 8.9–10.3)
Chloride: 100 mmol/L (ref 98–111)
Creatinine, Ser: 0.97 mg/dL (ref 0.44–1.00)
GFR, Estimated: >60 mL/min (ref >60)
Glucose, Bld: 131 mg/dL — ABNORMAL HIGH (ref 70–99)
Potassium: 3.1 mmol/L — ABNORMAL LOW (ref 3.5–5.1)
Sodium: 139 mmol/L (ref 135–145)

## 2023-07-06 LAB — CBC
HCT: 41.6 % (ref 36.0–46.0)
Hemoglobin: 14.2 g/dL (ref 12.0–15.0)
MCH: 29 pg (ref 26.0–34.0)
MCHC: 34.1 g/dL (ref 30.0–36.0)
MCV: 84.9 fL (ref 80.0–100.0)
Platelets: 331 10*3/uL (ref 150–400)
RBC: 4.9 MIL/uL (ref 3.87–5.11)
RDW: 13.1 % (ref 11.5–15.5)
WBC: 7.2 10*3/uL (ref 4.0–10.5)
nRBC: 0 % (ref 0.0–0.2)

## 2023-07-06 LAB — URINALYSIS, ROUTINE W REFLEX MICROSCOPIC
Bilirubin Urine: NEGATIVE
Glucose, UA: NEGATIVE mg/dL
Hgb urine dipstick: NEGATIVE
Ketones, ur: NEGATIVE mg/dL
Leukocytes,Ua: NEGATIVE
Nitrite: NEGATIVE
Protein, ur: NEGATIVE mg/dL
Specific Gravity, Urine: 1.003 — ABNORMAL LOW (ref 1.005–1.030)
pH: 7 (ref 5.0–8.0)

## 2023-07-06 LAB — TROPONIN I (HIGH SENSITIVITY)
Troponin I (High Sensitivity): 5 ng/L (ref <18)
Troponin I (High Sensitivity): 4 ng/L (ref <18)

## 2023-07-06 LAB — LIPASE, BLOOD: Lipase: 23 U/L (ref 11–51)

## 2023-07-06 LAB — MAGNESIUM: Magnesium: 1.6 mg/dL — ABNORMAL LOW (ref 1.7–2.4)

## 2023-07-06 MED ORDER — SODIUM CHLORIDE 0.9 % IV BOLUS
1000.0000 mL | Freq: Once | INTRAVENOUS | Status: AC
  Administered 2023-07-06: 1000 mL via INTRAVENOUS

## 2023-07-06 MED ORDER — MORPHINE SULFATE (PF) 4 MG/ML IV SOLN
4.0000 mg | Freq: Once | INTRAVENOUS | Status: AC
  Administered 2023-07-06: 4 mg via INTRAVENOUS
  Filled 2023-07-06: qty 1

## 2023-07-06 MED ORDER — MAGNESIUM SULFATE 2 GM/50ML IV SOLN
2.0000 g | Freq: Once | INTRAVENOUS | Status: AC
  Administered 2023-07-06: 2 g via INTRAVENOUS
  Filled 2023-07-06: qty 50

## 2023-07-06 MED ORDER — HYDROCODONE-ACETAMINOPHEN 5-325 MG PO TABS: 0.5000 | ORAL_TABLET | ORAL | 0 refills | Status: DC | PRN

## 2023-07-06 MED ORDER — ALPRAZOLAM 0.25 MG PO TABS
1.0000 mg | ORAL_TABLET | Freq: Once | ORAL | Status: AC
  Administered 2023-07-06: 1 mg via ORAL
  Filled 2023-07-06: qty 4

## 2023-07-06 MED ORDER — POTASSIUM CHLORIDE CRYS ER 20 MEQ PO TBCR
40.0000 meq | EXTENDED_RELEASE_TABLET | Freq: Once | ORAL | Status: AC
  Administered 2023-07-06: 40 meq via ORAL
  Filled 2023-07-06: qty 2

## 2023-07-06 MED ORDER — DICYCLOMINE HCL 10 MG PO CAPS
20.0000 mg | ORAL_CAPSULE | Freq: Once | ORAL | Status: AC
  Administered 2023-07-06: 20 mg via ORAL
  Filled 2023-07-06: qty 2

## 2023-07-06 MED ORDER — FAMOTIDINE IN NACL 20-0.9 MG/50ML-% IV SOLN
20.0000 mg | Freq: Once | INTRAVENOUS | Status: AC
  Administered 2023-07-06: 20 mg via INTRAVENOUS
  Filled 2023-07-06: qty 50

## 2023-07-06 MED ORDER — POTASSIUM CHLORIDE CRYS ER 20 MEQ PO TBCR: 20.0000 meq | EXTENDED_RELEASE_TABLET | Freq: Every day | ORAL | 0 refills | Status: DC

## 2023-07-06 MED ORDER — ONDANSETRON HCL 4 MG/2ML IJ SOLN
4.0000 mg | Freq: Once | INTRAMUSCULAR | Status: AC
  Administered 2023-07-06: 4 mg via INTRAVENOUS
  Filled 2023-07-06: qty 2

## 2023-07-06 NOTE — ED Notes (Signed)
Patient discharged by this RN. Patient verbalizes understanding of instructions with no additional questions for this RN. In wheelchair to lobby at time of discharge.

## 2023-07-06 NOTE — Discharge Instructions (Signed)
Your lab evaluation was overall reassuring you had slightly low potassium and magnesium and these were replaced today.  Please take potassium tablets for the next 5 days and try and stay well-hydrated with diarrhea episodes.  Xifaxan that was prescribed by your GI doctor has been approved and should be available to pick up at the pharmacy today, please start this medication immediately and complete entire course and then follow-up closely with Dr. Leone Payor as planned.  In addition to this medication I have sent in some additional pain medication, continue using this as well as dicyclomine and Imodium.

## 2023-07-06 NOTE — ED Provider Notes (Signed)
[place] EMERGENCY DEPARTMENT AT [facility] Provider Note   CSN: [id number] Arrival date & time: 07/06/23  4098     History  Chief Complaint  Patient presents with   Abdominal Pain   Diarrhea   Chest Pain    Felicia Odom is a 67 y.o. female.  Felicia Odom is a 67 y.o. female with a history of hyperlipidemia, hypertension, chronic bronchitis, GERD, chronic diastolic heart failure, diabetes, panic disorder, OCD, substance use, who presents to the emergency department for evaluation of ongoing abdominal pain, diarrhea and vomiting.  This has been an ongoing issue for the past 5 months and patient has been following with Dr. Faustino Congress at [facility] regarding the symptoms.  Patient had a colonoscopy that was overall reassuring, and they are concerned for IBS-D.  Patient was prescribed Xifaxan and dicyclomine at most recent appointment, has not been able to start the Xifaxan yet pending prior authorization.  Patient was seen in the ED on 12/7 for the same symptoms and at that time had CT scan that was reassuring.  Was prescribed a short course of hydrocodone which was helping her pain but she is run out of the medication.  Patient also reports using Imodium.  Labs were significant for hypokalemia and hypomagnesemia in the setting of diarrhea.  Patient reports she woke up this morning and had a watery bowel movement with some solid stool and mucus that patient describes as appearing like "pink snot".  Patient also reports that when she tried to take her morning dose of Bentyl she threw up and since vomiting has had some central and right-sided chest pain.  Pain is nonradiating, nonexertional.  No associated palpitations or shortness of breath.  Pain is not pleuritic.  Patient is very tearful throughout history and reports that the symptoms really tend to make her panic. She is very concerned about the smell of her stool, has had negative C. Diff testing and GI pathogen panel  complete on 12/7 positive for sapovirus, but otherwise negative.  The history is provided by the patient, medical records and a significant other.  Abdominal Pain Associated symptoms: chest pain, diarrhea and vomiting   Associated symptoms: no chills, no cough, no fever and no shortness of breath   Diarrhea Associated symptoms: abdominal pain and vomiting   Associated symptoms: no chills and no fever   Chest Pain Associated symptoms: abdominal pain and vomiting   Associated symptoms: no cough, no fever and no shortness of breath        Home Medications Prior to Admission medications   Medication Sig Start Date End Date Taking? Authorizing Provider  ACCU-CHEK GUIDE test strip USE 1 TO CHECK BLOOD SUGARS ONCE A WEEK 11/25/22   [provider]  acetaminophen (TYLENOL) 500 MG tablet Take 1,000 mg by mouth every 6 (six) hours as needed for fever or headache.    [provider]  albuterol (PROVENTIL) (2.5 MG/3ML) 0.083% nebulizer solution USE 1 VIAL IN NEBULIZER EVERY 6 HOURS - And As Needed 04/12/23   Jetty Duhamel D, MD  albuterol (VENTOLIN HFA) 108 (90 Base) MCG/ACT inhaler INHALE 1 TO 2 PUFFS INTO THE LUNGS EVERY 4 HOURS AS NEEDED FOR WHEEZING OR SHORTNESS OF BREATH 04/20/23   Young, Joni Fears D, MD  ALPRAZolam (XANAX) 1 MG tablet TAKE 1 TABLET (1 MG TOTAL) BY MOUTH EVERY 6 (SIX) HOURS AS NEEDED. FOR ANXIETY 03/15/23   Waymon Budge, MD  cholestyramine Lanetta Inch) 4 g packet Take 1 packet (4  g total) by mouth daily with lunch. 06/28/23   Iva Boop, MD  clindamycin (CLEOCIN T) 1 % external solution Apply 1 Application topically as needed. 06/08/23   [provider]  dicyclomine (BENTYL) 20 MG tablet Take 1 tablet (20 mg total) by mouth every 6 (six) hours as needed for spasms (abdominal pain). 07/03/23   Iva Boop, MD  diltiazem (CARDIZEM CD) 120 MG 24 hr capsule TAKE 1 CAPSULE BY MOUTH EVERY DAY 05/04/23   Rollene Rotunda, MD  diltiazem (CARDIZEM) 30 MG  tablet TAKE 1 TABLET (30 MG TOTAL) BY MOUTH EVERY 4 (FOUR) HOURS AS NEEDED (ELEVATED HEART RATE). 04/14/23   Rollene Rotunda, MD  ELIQUIS 5 MG TABS tablet TAKE 1 TABLET BY MOUTH TWICE A DAY 04/14/23   Revankar, Aundra Dubin, MD  esomeprazole (NEXIUM) 20 MG capsule Take 20 mg by mouth daily as needed (acid reflux). 08/19/22   [provider]  furosemide (LASIX) 40 MG tablet Take 1 tablet by mouth twice daily 02/10/23   Rollene Rotunda, MD  HYDROcodone-acetaminophen (NORCO/VICODIN) 5-325 MG tablet Take 1 tablet by mouth every 4 (four) hours as needed. 07/01/23   Jacalyn Lefevre, MD  Multiple Vitamin (MULTIVITAMIN) tablet Take 1 tablet by mouth daily.    [provider]  ondansetron (ZOFRAN-ODT) 4 MG disintegrating tablet Take 1 tablet (4 mg total) by mouth every 8 (eight) hours as needed. 07/01/23   Jacalyn Lefevre, MD  rifaximin (XIFAXAN) 550 MG TABS tablet Take 1 tablet (550 mg total) by mouth 3 (three) times daily. 06/30/23   Iva Boop, MD  rosuvastatin (CRESTOR) 5 MG tablet Take 1 tablet (5 mg total) by mouth every other day. 12/26/22 06/30/23  Rollene Rotunda, MD  valACYclovir (VALTREX) 1000 MG tablet as needed. 11/21/22   [provider]  Vitamin D, Ergocalciferol, (DRISDOL) 1.25 MG (50000 UNIT) CAPS capsule Take 50,000 Units by mouth every 7 (seven) days. 11/21/22   [provider]      Allergies    Sulfa antibiotics    Review of Systems   Review of Systems  Constitutional:  Negative for chills and fever.  Respiratory:  Negative for cough and shortness of breath.   Cardiovascular:  Positive for chest pain.  Gastrointestinal:  Positive for abdominal pain, diarrhea and vomiting.    Physical Exam Updated Vital Signs BP (!) 122/101   Pulse 80   Temp 98.7 F (37.1 C) (Oral)   Resp (!) 22   Ht 5\' 5"  (1.651 m)   Wt 85.7 kg   SpO2 100%   BMI 31.44 kg/m  Physical Exam Vitals and nursing note reviewed.  Constitutional:      General: She is not in acute  distress.    Appearance: Normal appearance. She is well-developed. She is not diaphoretic.     Comments: Patient is alert, tearful and very anxious but in no acute distress  HENT:     Head: Normocephalic and atraumatic.  Eyes:     General:        Right eye: No discharge.        Left eye: No discharge.     Pupils: Pupils are equal, round, and reactive to light.  Cardiovascular:     Rate and Rhythm: Normal rate and regular rhythm.     Pulses: Normal pulses.     Heart sounds: Normal heart sounds.  Pulmonary:     Effort: Pulmonary effort is normal. No respiratory distress.     Breath sounds: Normal breath sounds.  No wheezing or rales.     Comments: Respirations equal and unlabored, patient able to speak in full sentences, lungs clear to auscultation bilaterally  Abdominal:     General: Bowel sounds are normal. There is no distension.     Palpations: Abdomen is soft. There is no mass.     Tenderness: There is generalized abdominal tenderness. There is no guarding.     Comments: Abdomen soft, nondistended, bowel sounds present and hyperactive throughout, mild generalized tenderness noted that does not localize to 1 quadrant, no guarding or rebound tenderness.  Musculoskeletal:        General: No deformity.     Cervical back: Neck supple.  Skin:    General: Skin is warm and dry.     Capillary Refill: Capillary refill takes less than 2 seconds.  Neurological:     Mental Status: She is alert and oriented to person, place, and time.     Coordination: Coordination normal.     Comments: Speech is clear, able to follow commands Moves extremities without ataxia, coordination intact  Psychiatric:        Mood and Affect: Mood is anxious. Affect is tearful.        Behavior: Behavior normal.     ED Results / Procedures / Treatments   Labs (all labs ordered are listed, but only abnormal results are displayed) Labs Reviewed  BASIC METABOLIC PANEL - Abnormal; Notable for the following  components:      Result Value   Potassium 3.1 (*)    Glucose, Bld 131 (*)    BUN 7 (*)    Anion gap 16 (*)    All other components within normal limits  URINALYSIS, ROUTINE W REFLEX MICROSCOPIC - Abnormal; Notable for the following components:   Color, Urine STRAW (*)    Specific Gravity, Urine 1.003 (*)    Bacteria, UA RARE (*)    All other components within normal limits  MAGNESIUM - Abnormal; Notable for the following components:   Magnesium 1.6 (*)    All other components within normal limits  CBC  LIPASE, BLOOD  TROPONIN I (HIGH SENSITIVITY)  TROPONIN I (HIGH SENSITIVITY)    EKG None  Radiology DG Chest 2 View Result Date: 07/06/2023 CLINICAL DATA:  Chest pain. EXAM: CHEST - 2 VIEW COMPARISON:  03/29/2023. FINDINGS: Bilateral lung fields are clear. Bilateral costophrenic angles are clear. Normal cardio-mediastinal silhouette. No acute osseous abnormalities. The soft tissues are within normal limits. IMPRESSION: No active cardiopulmonary disease. Electronically Signed   By: Jules Schick M.D.   On: 07/06/2023 08:31   DG BONE DENSITY (DXA) Result Date: 07/05/2023 EXAM: DUAL X-RAY ABSORPTIOMETRY (DXA) FOR BONE MINERAL DENSITY IMPRESSION: Referring Physician:  Hillery Aldo Your patient completed a bone mineral density test using GE Lunar iDXA system (analysis version: 16). Technologist: BEC PATIENT: Name: Denisa, Lemaitre Patient ID: [id number] Birth Date: 1956/01/27 Height: 65.0 in. Sex: Female Measured: 07/05/2023 Weight: 183.0 lbs. Indications: Albuterol, Caucasian, COPD, Estrogen Deficient, Family Hist. (Parent hip fracture), Glucocorticoids (Chronic) (255.41), Height Loss (781.91), long term use of high risk medication, Low Calcium Intake, One ovary removed, Postmenopausal, Rheumatoid Arthritis (714.0) Fractures: None Treatments: Vitamin D (E933.5) ASSESSMENT: The BMD measured at Femur Neck Right is 0.829 g/cm2 with a T-score of -1.5. This patient's diagnostic category is  LOW BONE MASS/OSTEOPENIA according to World Health Organization [facility]) criteria. The lumbar spine was excluded due to being excluded from the prior exam. The quality of the exam is good. Comparison to 03/26/2019.  Since the prior study, there has been NO SIGNIFICANT CHANGE in bone mineral density of the hips or LEFT forearm. Site Region Measured Date Measured Age YA BMD Significant CHANGE T-score DualFemur Neck Right 07/05/2023 66.7 -1.5 0.829 g/cm2 * DualFemur Neck Right 03/26/2019 62.4 -1.1 0.888 g/cm2 * DualFemur Total Mean 07/05/2023 66.7 -0.6 0.938 g/cm2 DualFemur Total Mean 03/26/2019 62.4 -0.4 0.963 g/cm2 Left Forearm Radius 33% 07/05/2023 66.7 -1.3 0.766 g/cm2 Left Forearm Radius 33% 03/26/2019 62.4 -1.4 0.766 g/cm2 World Health Organization [facility]) criteria for post-menopausal, Caucasian Women: Normal       T-score at or above -1 SD Osteopenia   T-score between -1 and -2.5 SD Osteoporosis T-score at or below -2.5 SD RECOMMENDATION: 1. All patients should optimize calcium and vitamin D intake. 2. Consider FDA-approved medical therapies in postmenopausal women and men aged 28 years and older, based on the following: a. A hip or vertebral (clinical or morphometric) fracture. b. T-score = -2.5 at the femoral neck or spine after appropriate evaluation to exclude secondary causes. c. Low bone mass (T-score between -1.0 and -2.5 at the femoral neck or spine) and a 10-year probability of a hip fracture = 3% or a 10-year probability of a major osteoporosis-related fracture = 20% based on the [place]-adapted WHO algorithm. d. Clinician judgment and/or patient preferences may indicate treatment for people with 10-year fracture probabilities above or below these levels. FOLLOW-UP: Patients with diagnosis of osteoporosis or at high risk for fracture should have regular bone mineral density tests.? Patients eligible for Medicare are allowed routine testing every 2 years.? The testing frequency can be increased to one year for  patients who have rapidly progressing disease, are receiving or discontinuing medical therapy to restore bone mass, or have additional risk factors. I have reviewed this study and agree with the findings. [facility] FRAX* 10-year Probability of Fracture Based on femoral neck BMD: DualFemur (Right) Major Osteoporotic Fracture: 30.7% Hip Fracture:                3.6% Population:                  [place] (Caucasian) Risk Factors: Family Hist. (Parent hip fracture), Glucocorticoids (Chronic) (255.41), Rheumatoid Arthritis (714.0) *FRAX is a Armed forces logistics/support/administrative officer of the [employer] of [employer] for Metabolic Bone Disease, a World Science writer (WHO) [employer]. ASSESSMENT: The probability of a major osteoporotic fracture is 30.7% within the next ten years. The probability of a hip fracture is 3.6% within the next ten years. Electronically Signed   By: Harmon Pier M.D.   On: 07/05/2023 08:04    Procedures Procedures    Medications Ordered in ED Medications  sodium chloride 0.9 % bolus 1,000 mL (0 mLs Intravenous Stopped 07/06/23 1002)  morphine (PF) 4 MG/ML injection 4 mg (4 mg Intravenous Given 07/06/23 0905)  ondansetron (ZOFRAN) injection 4 mg (4 mg Intravenous Given 07/06/23 0906)  famotidine (PEPCID) IVPB 20 mg premix (0 mg Intravenous Stopped 07/06/23 0957)  morphine (PF) 4 MG/ML injection 4 mg (4 mg Intravenous Given 07/06/23 1038)  ALPRAZolam (XANAX) tablet 1 mg (1 mg Oral Given 07/06/23 1040)  potassium chloride SA (KLOR-CON M) CR tablet 40 mEq (40 mEq Oral Given 07/06/23 1041)  dicyclomine (BENTYL) capsule 20 mg (20 mg Oral Given 07/06/23 1225)  magnesium sulfate IVPB 2 g 50 mL (0 g Intravenous Stopped 07/06/23 1310)    ED Course/ Medical Decision Making/ A&P  Medical Decision Making Amount and/or Complexity of Data Reviewed Labs: ordered. Radiology: ordered.  Risk Prescription drug management.  67 y.o. female  presents to the ED with complaints of abdominal pain, vomiting, diarrhea and chest pain, this involves an extensive number of treatment options, and is a complaint that carries with it a high risk of complications and morbidity.    On arrival pt is nontoxic, vitals WNL.   Additional history obtained from significant other at bedside. Previous records obtained and reviewed including recent ED visit and GI follow-up.  I ordered medication including fluid bolus, IV Zofran, morphine, Pepcid, potassium and magnesium replacement  Lab Tests:  I Ordered, reviewed, and interpreted labs, which included: CBC, BMP, lipase, troponin, magnesium, urinalysis  Imaging Studies ordered:  I ordered imaging studies which included CT chest x-ray, I independently visualized and interpreted imaging which showed no active cardiopulmonary disease  ED Course:   Patient with ongoing abdominal pain, diarrhea and vomiting but has been present for several months, closely following with GI with working diagnosis of IBS-D.  Has had reassuring colonoscopy.  Had CT scan on 12/7 which showed diarrheal state but was also reassuring.  Negative C. difficile and GI pathogen testing.  Patient is on cholestyramine and dicyclomine and should be starting Xifaxan today after prior authorization went through.  Patient is extremely anxious and panicked regarding symptoms and I suspect this is likely negatively affecting her treatment.  Patient does use Xanax for anxiety.  May benefit from more chronic anxiety management  Evaluation today has largely been reassuring, mild hypokalemia and hypomagnesemia as expected in setting of ongoing diarrhea but patient was able to tolerate p.o. fluids, pain controlled here in the ED and patient given IV fluids for rehydration.  Had extensive conversation with patient regarding more chronic nature of symptoms and need for ongoing evaluation and follow-up with GI and that irritable bowel syndrome is likely  contributing.  Patient is tearful and frustrated with symptoms, provided reassurance.  Patient reported chest pain, evaluation today has been reassuring, EKG without ischemic changes, troponins negative x 2 chest x-ray without active cardiopulmonary disease.  Very low suspicion for PE as patient is on Eliquis.  Normal hemoglobin, low suspicion for GI bleeding.  Suspect chest discomfort may be in the setting of emesis, Pepcid given.  Considered admission but given that lab work is overall reassuring with minor electrolyte derangements and replacement completed here in the emergency department do not feel that patient requires hospitalization.  She will be able to pick up his Afaxin, prescribed additional pain medication and potassium tablets as well.  Patient will follow-up closely with Dr. Curlene Labrum with GI after completing course of Xifaxan  At this time there does not appear to be any evidence of an acute emergency medical condition requiring further emergent evaluation and the patient appears stable for discharge with appropriate outpatient follow up. Diagnosis and return precautions discussed with patient who verbalizes understanding and is agreeable to discharge.    Social Determinants of Health: Significant anxiety    Portions of this note were generated with Scientist, clinical (histocompatibility and immunogenetics). Dictation errors may occur despite best attempts at proofreading.         Final Clinical Impression(s) / ED Diagnoses Final diagnoses:  Diarrhea, unspecified type  Abdominal pain, unspecified abdominal location  Hypokalemia  Hypomagnesemia  Atypical chest pain    Rx / DC Orders ED Discharge Orders          Ordered    potassium chloride SA (KLOR-CON M)  20 MEQ tablet  Daily        07/06/23 1234    HYDROcodone-acetaminophen (NORCO/VICODIN) 5-325 MG tablet  Every 4 hours PRN        07/06/23 1233              Rosezella Rumpf, [place] 07/06/23 1422    Lonell Grandchild, MD 07/06/23  1426

## 2023-07-06 NOTE — ED Triage Notes (Signed)
Pt arrived via [facility] from home for c/o generalized abdominal pain and watery diarrhea, int. [id number]. Pt states the pain got worse this morning upon waking. Pt c/o diarrhea being "pink snot." Upon waking this morning. Pt takes eliquis. Pt c/o right sided chest pain upon waking this morning.

## 2023-07-06 NOTE — Telephone Encounter (Signed)
PA has been submitted, and telephone encounter has been created. 

## 2023-07-06 NOTE — Consult Note (Addendum)
Consultation  Referring Provider: Dr. Rhoderick Moody    Primary Care Physician:  Ellyn Hack, MD Primary Gastroenterologist:    Dr. Leone Payor     Reason for Consultation:   Nausea, vomiting and hematochezia         HPI:   Felicia Odom is a 67 y.o. female with a past medical history as listed below including cocaine abuse, marijuana use, COPD, GERD, type 2 diabetes, OCD, H. pylori gastritis and multiple others, who presented to the ED on 07/06/2023 with a complaint of generalized abdominal pain and watery diarrhea with vomiting.  Apparently had worsened yesterday morning and diarrhea look like "pink snot".  She is reportedly on Eliquis.    07/01/2023 ER visit for abdominal pain.  At that time described that her stomach was going to explode and the pain was worse with nausea.  EMS given her Fentanyl and Zofran.  Labs showed a potassium of 3.  GI pathogen panel was positive for Sapovirus. CT abdomen pelvis showed no acute process, fluid-filled colon suggesting diarrhea and development of a 1.0 cm hypoattenuating right renal lesion which was borderline to and too small to characterize.  At that point patient was feeling better after pain meds she was told to take the Xifaxan and given a diet for IBS.    Today, patient is in hysterics when discussing her abdominal pain and symptoms.  It was really quite hard to get a clear history from her.  Apparently when she saw Dr. Leone Payor on Friday she was actually feeling "pretty good", but then Saturday woke up and felt terrible with nausea and generalized abdominal pain that radiated up into her chest.  She had some dry heaving and came into the ER as above with negative workup.    Since being seen in the ER she has continued with generalized abdominal pain which is all over "too extreme for IBS right? ".  It seems to radiate up into her right chest and also continues with various "smelly" liquid bowel movements sometimes up to 17 a day.  Tried to decipher  if this has actually slowed as in notes from Dr. Leone Payor with addition of Cholestyramine, initially patient said yes but then hummed and hawed back and forth.  I am not sure if it is actually helping.  She has not started the Xifaxan yet which was just approved by her insurance.  Also occasional bright red blood in her stool.    Denies of fever or chills.  ED course: Hemoglobin 14.2 and CB C also otherwise stable, BMP is not resulted; chest x-ray for chest pain showed no active cardiopulmonary disease  GI history: 12/24 [facility] stool test negative for C. difficile and other organisms 06/30/2023 office visit with Dr. Leone Payor for IBS with diarrhea-at that time discussed that since her colonoscopy in September she had loose stools which are explosive with a foul smell, started Cholestyramine on 06/29/2023 and bowel movements decreased from 10-5, was also on over-the-counter Imodium, also start pelvic floor therapy and it helped-at that time given 2 weeks of Xifaxan for possible SIBO 04/13/2023 colonoscopy with decreased voluntary sphincter tone, small rectocele and exaggerated descent found on digital rectal exam, 115 mm polyp in the transverse colon and diverticulosis; pathology showed sessile serrated polyp without dysplasia  Past Medical History:  Diagnosis Date   Anxiety    Aortic atherosclerosis (HCC) 10/06/2021   Arthritis    neck   Cannabis dependence with current use (HCC) 07/26/2016  Chronic back pain    Chronic bronchitis (HCC)    Chronic diastolic CHF (congestive heart failure) (HCC)    Chronic neck pain    Cocaine abuse (HCC) 04/05/2021   COPD mixed type (HCC) 02/18/2019   Office Spirometry 10/21/2015-slight restriction of exhaled volume, mild obstruction. FVC 2.69/78%, FEV1 2.04/76%, FEV1/FVC 0.76, FEF 25-75 percent 1.68/65% PFT 04/27/2016-minimal obstructive airways disease, minimal diffusion defect, insignificant response to bronchodilator. FVC 2.79/78%, FEV1 2.11/76%, ratio 0.76,  FEF 25-75 percent 1.81/72%, TLC 92%, DLCO 76%.   DDD (degenerative disc disease), cervical 06/15/2015   Diverticulosis    Eczema 11/29/2017   Encounter for long-term opiate analgesic use 07/06/2017   Essential hypertension 09/25/2015   GERD (gastroesophageal reflux disease)    takes Nexium daily   Helicobacter pylori gastritis 08/2019   Tx and eradicated   Hyperlipidemia    not on any meds bc refused to pick up from pharmacy   Long-term current use of opiate analgesic 04/05/2021   Moderate episode of recurrent major depressive disorder (HCC) 01/28/2020   Obesity (BMI 30.0-34.9) 12/16/2021   Obsessive-compulsive disorder 06/08/2007   Qualifier: Diagnosis of  By: Jerolyn Shin     PAF (paroxysmal atrial fibrillation) (HCC) 12/16/2021   PANIC DISORDER 06/08/2007   Qualifier: Diagnosis of  By: Jerolyn Shin     Pneumonia    2016   PONV (postoperative nausea and vomiting)    Primary osteoarthritis involving multiple joints 01/21/2015   Seborrheic dermatitis of scalp 02/11/2015   Seizures (HCC)    in the 80's   Thyroid nodule 07/30/2020   IMPRESSION: No significant interval change of bilateral thyroid nodules.   The above is in keeping with the ACR TI-RADS recommendations - J Am Coll Radiol 2017;14:587-595.     Electronically Signed   By: Acquanetta Belling M.D.   On: 08/17/2020 14:18   Type II diabetes mellitus with manifestations (HCC) 02/18/2019   The 10-year ASCVD risk score Denman George DC Montez Hageman., et al., 2013) is: 9.1%   Values used to calculate the score:     Age: 61 years     Sex: Female     Is Non-Hispanic African American: No     Diabetic: Yes     Tobacco smoker: No     Systolic Blood Pressure: 138 mmHg     Is BP treated: No     HDL Cholesterol: 46.2 mg/dL     Total Cholesterol: 180 mg/dL    Past Surgical History:  Procedure Laterality Date   ATRIAL FIBRILLATION ABLATION N/A 09/08/2022   Procedure: ATRIAL FIBRILLATION ABLATION;  Surgeon: Regan Lemming, MD;  Location: [facility];   Service: Cardiovascular;  Laterality: N/A;   COLONOSCOPY     ECTOPIC PREGNANCY SURGERY     x 2    ESOPHAGOGASTRODUODENOSCOPY     LAPAROSCOPIC LYSIS INTESTINAL ADHESIONS     x 2   RESECTION OF MEDIASTINAL MASS N/A 06/10/2016   Procedure: RESECTION OF MEDIASTINAL MASS;  Surgeon: Delight Ovens, MD;  Location: [facility] OR;  Service: Thoracic;  Laterality: N/A;   VIDEO ASSISTED THORACOSCOPY Right 06/10/2016   Procedure: VIDEO ASSISTED THORACOSCOPY WITH PLACEMENT OF ON Q TUNNELER;  Surgeon: Delight Ovens, MD;  Location: [facility] OR;  Service: Thoracic;  Laterality: Right;   VIDEO BRONCHOSCOPY N/A 06/10/2016   Procedure: VIDEO BRONCHOSCOPY;  Surgeon: Delight Ovens, MD;  Location: [facility] OR;  Service: Thoracic;  Laterality: N/A;    Family History  Problem Relation Age of Onset   Heart attack  Father    Prostate cancer Father    Heart failure Father    Diabetes Mother    Clotting disorder Mother        PE   Heart failure Mother    Kidney disease Mother    Breast cancer Sister    Colon cancer Sister    Esophageal cancer Neg Hx    Rectal cancer Neg Hx    Stomach cancer Neg Hx     Social History   Tobacco Use   Smoking status: Former    Current packs/day: 0.00    Average packs/day: 1 pack/day for 36.0 years (36.0 ttl pk-yrs)    Types: Cigarettes    Start date: 74    Quit date: 2017    Years since quitting: 7.9   Smokeless tobacco: Never   Tobacco comments:    Former smoker last cigarette 2017 04/15/22  Vaping Use   Vaping status: Never Used  Substance Use Topics   Alcohol use: No    Alcohol/week: 0.0 standard drinks of alcohol   Drug use: No    Prior to Admission medications   Medication Sig Start Date End Date Taking? Authorizing Provider  ACCU-CHEK GUIDE test strip USE 1 TO CHECK BLOOD SUGARS ONCE A WEEK 11/25/22   [provider]  acetaminophen (TYLENOL) 500 MG tablet Take 1,000 mg by mouth every 6 (six) hours as needed for fever or headache.    [provider]  albuterol (PROVENTIL) (2.5 MG/3ML) 0.083% nebulizer solution USE 1 VIAL IN NEBULIZER EVERY 6 HOURS - And As Needed 04/12/23   Jetty Duhamel D, MD  albuterol (VENTOLIN HFA) 108 (90 Base) MCG/ACT inhaler INHALE 1 TO 2 PUFFS INTO THE LUNGS EVERY 4 HOURS AS NEEDED FOR WHEEZING OR SHORTNESS OF BREATH 04/20/23   Young, Joni Fears D, MD  ALPRAZolam (XANAX) 1 MG tablet TAKE 1 TABLET (1 MG TOTAL) BY MOUTH EVERY 6 (SIX) HOURS AS NEEDED. FOR ANXIETY 03/15/23   Jetty Duhamel D, MD  cholestyramine Lanetta Inch) 4 g packet Take 1 packet (4 g total) by mouth daily with lunch. 06/28/23   Iva Boop, MD  clindamycin (CLEOCIN T) 1 % external solution Apply 1 Application topically as needed. 06/08/23   [provider]  dicyclomine (BENTYL) 20 MG tablet Take 1 tablet (20 mg total) by mouth every 6 (six) hours as needed for spasms (abdominal pain). 07/03/23   Iva Boop, MD  diltiazem (CARDIZEM CD) 120 MG 24 hr capsule TAKE 1 CAPSULE BY MOUTH EVERY DAY 05/04/23   Rollene Rotunda, MD  diltiazem (CARDIZEM) 30 MG tablet TAKE 1 TABLET (30 MG TOTAL) BY MOUTH EVERY 4 (FOUR) HOURS AS NEEDED (ELEVATED HEART RATE). 04/14/23   Rollene Rotunda, MD  ELIQUIS 5 MG TABS tablet TAKE 1 TABLET BY MOUTH TWICE A DAY 04/14/23   Revankar, Aundra Dubin, MD  esomeprazole (NEXIUM) 20 MG capsule Take 20 mg by mouth daily as needed (acid reflux). 08/19/22   [provider]  furosemide (LASIX) 40 MG tablet Take 1 tablet by mouth twice daily 02/10/23   Rollene Rotunda, MD  HYDROcodone-acetaminophen (NORCO/VICODIN) 5-325 MG tablet Take 1 tablet by mouth every 4 (four) hours as needed. 07/01/23   Jacalyn Lefevre, MD  Multiple Vitamin (MULTIVITAMIN) tablet Take 1 tablet by mouth daily.    [provider]  ondansetron (ZOFRAN-ODT) 4 MG disintegrating tablet Take 1 tablet (4 mg total) by mouth every 8 (eight) hours as needed. 07/01/23   Jacalyn Lefevre, MD  rifaximin Burman Blacksmith) 550  MG TABS tablet Take 1 tablet (550 mg  total) by mouth 3 (three) times daily. 06/30/23   Iva Boop, MD  rosuvastatin (CRESTOR) 5 MG tablet Take 1 tablet (5 mg total) by mouth every other day. 12/26/22 06/30/23  Rollene Rotunda, MD  valACYclovir (VALTREX) 1000 MG tablet as needed. 11/21/22   [provider]  Vitamin D, Ergocalciferol, (DRISDOL) 1.25 MG (50000 UNIT) CAPS capsule Take 50,000 Units by mouth every 7 (seven) days. 11/21/22   [provider]    No current facility-administered medications for this encounter.   Current Outpatient Medications  Medication Sig Dispense Refill   ACCU-CHEK GUIDE test strip USE 1 TO CHECK BLOOD SUGARS ONCE A WEEK     acetaminophen (TYLENOL) 500 MG tablet Take 1,000 mg by mouth every 6 (six) hours as needed for fever or headache.     albuterol (PROVENTIL) (2.5 MG/3ML) 0.083% nebulizer solution USE 1 VIAL IN NEBULIZER EVERY 6 HOURS - And As Needed 90 mL 11   albuterol (VENTOLIN HFA) 108 (90 Base) MCG/ACT inhaler INHALE 1 TO 2 PUFFS INTO THE LUNGS EVERY 4 HOURS AS NEEDED FOR WHEEZING OR SHORTNESS OF BREATH 18 each 6   ALPRAZolam (XANAX) 1 MG tablet TAKE 1 TABLET (1 MG TOTAL) BY MOUTH EVERY 6 (SIX) HOURS AS NEEDED. FOR ANXIETY 120 tablet 3   cholestyramine (QUESTRAN) 4 g packet Take 1 packet (4 g total) by mouth daily with lunch. 30 each 3   clindamycin (CLEOCIN T) 1 % external solution Apply 1 Application topically as needed.     dicyclomine (BENTYL) 20 MG tablet Take 1 tablet (20 mg total) by mouth every 6 (six) hours as needed for spasms (abdominal pain). 90 tablet 1   diltiazem (CARDIZEM CD) 120 MG 24 hr capsule TAKE 1 CAPSULE BY MOUTH EVERY DAY 90 capsule 0   diltiazem (CARDIZEM) 30 MG tablet TAKE 1 TABLET (30 MG TOTAL) BY MOUTH EVERY 4 (FOUR) HOURS AS NEEDED (ELEVATED HEART RATE). 90 tablet 0   ELIQUIS 5 MG TABS tablet TAKE 1 TABLET BY MOUTH TWICE A DAY 180 tablet 3   esomeprazole (NEXIUM) 20 MG capsule Take 20 mg by mouth daily as needed (acid reflux).     furosemide (LASIX)  40 MG tablet Take 1 tablet by mouth twice daily 180 tablet 3   HYDROcodone-acetaminophen (NORCO/VICODIN) 5-325 MG tablet Take 1 tablet by mouth every 4 (four) hours as needed. 10 tablet 0   Multiple Vitamin (MULTIVITAMIN) tablet Take 1 tablet by mouth daily.     ondansetron (ZOFRAN-ODT) 4 MG disintegrating tablet Take 1 tablet (4 mg total) by mouth every 8 (eight) hours as needed. 20 tablet 0   rifaximin (XIFAXAN) 550 MG TABS tablet Take 1 tablet (550 mg total) by mouth 3 (three) times daily. 42 tablet 0   rosuvastatin (CRESTOR) 5 MG tablet Take 1 tablet (5 mg total) by mouth every other day. 45 tablet 3   valACYclovir (VALTREX) 1000 MG tablet as needed.     Vitamin D, Ergocalciferol, (DRISDOL) 1.25 MG (50000 UNIT) CAPS capsule Take 50,000 Units by mouth every 7 (seven) days.      Allergies as of 07/06/2023 - Review Complete 07/06/2023  Allergen Reaction Noted   Sulfa antibiotics Hives and Swelling 04/04/2022     Review of Systems:    Constitutional: No weight loss, fever or chills Skin: No rash  Cardiovascular: See HPI Respiratory: No SOB Gastrointestinal: See HPI and otherwise negative Genitourinary: No dysuria  Neurological: No headache Musculoskeletal: No  new muscle or joint pain Hematologic: No bleeding or bruising Psychiatric: No history of depression or anxiety    Physical Exam:  Vital signs in last 24 hours: Temp:  [98.7 F (37.1 C)] 98.7 F (37.1 C) (12/12 0743) Pulse Rate:  [80-111] 111 (12/12 0800) Resp:  [22-25] 25 (12/12 0800) BP: (122-133)/(73-101) 133/73 (12/12 0800) SpO2:  [98 %-100 %] 100 % (12/12 0800) Weight:  [85.7 kg] 85.7 kg (12/12 0740)   General: Upset Caucasian female appears to be in mild to moderate distress, Well developed, Well nourished, alert and somewhat cooperative Head:  Normocephalic and atraumatic. Eyes:   PEERL, EOMI. No icterus. Conjunctiva pink. Ears:  Normal auditory acuity. Neck:  Supple Throat: Oral cavity and pharynx without  inflammation, swelling or lesion. Teeth in good condition. Lungs: Respirations even and unlabored. Lungs clear to auscultation bilaterally.   No wheezes, crackles, or rhonchi.  Heart: Normal S1, S2. No MRG. Regular rate and rhythm. No peripheral edema, cyanosis or pallor.  Abdomen: Soft, mild to moderate distention, generalized moderate TTP. No rebound or guarding.  Increased bowel sounds all 4 quadrants. No appreciable masses or hepatomegaly. Rectal:  Not performed.  Msk:  Symmetrical without gross deformities. Peripheral pulses intact.  Extremities:  Without edema, no deformity or joint abnormality. Normal ROM, normal sensation. Neurologic:  Alert and  oriented x4;  grossly normal neurologically.  Skin:   Dry and intact without significant lesions or rashes. Psychiatric: Demonstrates good judgement and reason without abnormal affect or behaviors.  Somewhat hysterical over symptoms   LAB RESULTS: Recent Labs    07/06/23 0753  WBC 7.2  HGB 14.2  HCT 41.6  PLT 331   STUDIES: DG Chest 2 View Result Date: 07/06/2023 CLINICAL DATA:  Chest pain. EXAM: CHEST - 2 VIEW COMPARISON:  03/29/2023. FINDINGS: Bilateral lung fields are clear. Bilateral costophrenic angles are clear. Normal cardio-mediastinal silhouette. No acute osseous abnormalities. The soft tissues are within normal limits. IMPRESSION: No active cardiopulmonary disease. Electronically Signed   By: Jules Schick M.D.   On: 07/06/2023 08:31   DG BONE DENSITY (DXA) Result Date: 07/05/2023 EXAM: DUAL X-RAY ABSORPTIOMETRY (DXA) FOR BONE MINERAL DENSITY IMPRESSION: Referring Physician:  Hillery Aldo Your patient completed a bone mineral density test using GE Lunar iDXA system (analysis version: 16). Technologist: BEC PATIENT: Name: Felicia Odom, Felicia Odom Patient ID: [id number] Birth Date: 11/19/1955 Height: 65.0 in. Sex: Female Measured: 07/05/2023 Weight: 183.0 lbs. Indications: Albuterol, Caucasian, COPD, Estrogen Deficient, Family Hist.  (Parent hip fracture), Glucocorticoids (Chronic) (255.41), Height Loss (781.91), long term use of high risk medication, Low Calcium Intake, One ovary removed, Postmenopausal, Rheumatoid Arthritis (714.0) Fractures: None Treatments: Vitamin D (E933.5) ASSESSMENT: The BMD measured at Femur Neck Right is 0.829 g/cm2 with a T-score of -1.5. This patient's diagnostic category is LOW BONE MASS/OSTEOPENIA according to World Health Organization [facility]) criteria. The lumbar spine was excluded due to being excluded from the prior exam. The quality of the exam is good. Comparison to 03/26/2019. Since the prior study, there has been NO SIGNIFICANT CHANGE in bone mineral density of the hips or LEFT forearm. Site Region Measured Date Measured Age YA BMD Significant CHANGE T-score DualFemur Neck Right 07/05/2023 66.7 -1.5 0.829 g/cm2 * DualFemur Neck Right 03/26/2019 62.4 -1.1 0.888 g/cm2 * DualFemur Total Mean 07/05/2023 66.7 -0.6 0.938 g/cm2 DualFemur Total Mean 03/26/2019 62.4 -0.4 0.963 g/cm2 Left Forearm Radius 33% 07/05/2023 66.7 -1.3 0.766 g/cm2 Left Forearm Radius 33% 03/26/2019 62.4 -1.4 0.766 g/cm2 World Health Organization [facility]) criteria for post-menopausal,  Caucasian Women: Normal       T-score at or above -1 SD Osteopenia   T-score between -1 and -2.5 SD Osteoporosis T-score at or below -2.5 SD RECOMMENDATION: 1. All patients should optimize calcium and vitamin D intake. 2. Consider FDA-approved medical therapies in postmenopausal women and men aged 26 years and older, based on the following: a. A hip or vertebral (clinical or morphometric) fracture. b. T-score = -2.5 at the femoral neck or spine after appropriate evaluation to exclude secondary causes. c. Low bone mass (T-score between -1.0 and -2.5 at the femoral neck or spine) and a 10-year probability of a hip fracture = 3% or a 10-year probability of a major osteoporosis-related fracture = 20% based on the [place]-adapted WHO algorithm. d. Clinician judgment and/or  patient preferences may indicate treatment for people with 10-year fracture probabilities above or below these levels. FOLLOW-UP: Patients with diagnosis of osteoporosis or at high risk for fracture should have regular bone mineral density tests.? Patients eligible for Medicare are allowed routine testing every 2 years.? The testing frequency can be increased to one year for patients who have rapidly progressing disease, are receiving or discontinuing medical therapy to restore bone mass, or have additional risk factors. I have reviewed this study and agree with the findings. [facility] FRAX* 10-year Probability of Fracture Based on femoral neck BMD: DualFemur (Right) Major Osteoporotic Fracture: 30.7% Hip Fracture:                3.6% Population:                  [place] (Caucasian) Risk Factors: Family Hist. (Parent hip fracture), Glucocorticoids (Chronic) (255.41), Rheumatoid Arthritis (714.0) *FRAX is a Armed forces logistics/support/administrative officer of the [employer] of [employer] for Metabolic Bone Disease, a World Science writer (WHO) [employer]. ASSESSMENT: The probability of a major osteoporotic fracture is 30.7% within the next ten years. The probability of a hip fracture is 3.6% within the next ten years. Electronically Signed   By: Harmon Pier M.D.   On: 07/05/2023 08:04     Impression / Plan:   Impression: 1.  Diarrhea with abdominal pain: Seen by Dr. Leone Payor in outpatient clinic and diagnosed with IBS-D, patient was prescribed Xifaxan she was just approved by her insurance, has not taken yet, interestingly in the ED on 12/7 patient had a GI path panel which showed Sapovirus which is typically self-limited, not sure if this is a contributing to symptoms or not, also likely some element of IBS-D, recent colonoscopy on 9/19 as in HPI 2.  Nausea and vomiting: This has been chronic for the patient over the past few months, at first describes dry heaving and now a few episodes of vomiting,  unable to assess quantity of the symptoms/frequency 3.  Reported hematochezia: Nothing lately per the patient  Plan: 1.  There may be some utility in recollecting a GI pathogen panel while she is in the hospital.  Otherwise giving empiric antibiotics may be of some benefit. 2.  Would closely monitor stool output to calculate frequencies that we can tell when therapies are starting to work for her 3.  Agree with analgesics as needed 4. Can be on regular diet as tolerated  Thank you for your kind consultation, we will continue to follow.  Violet Baldy [facility]  07/06/2023, 8:47 AM  I performed chart review and discussed this patient with our PA after they had seen Ms. Towson in consultation, but this patient was then  discharged from the ED before I could see her personally.  Well-known to Dr. Leone Payor with a diagnosis of IBS, ongoing symptoms with some worsened vomiting and diarrhea.  Has not yet started the rifaximin which was approved (empiric therapy for possible SIBO).  Extensive imaging, colonoscopic lab and stool study workup in recent months. Sapovirus on GI pathogen panel may be a false positive.  (We have seen many false positives of norovirus and other variants of uncertain GI pathogen panels) Probably benign anorectal bleeding due to the severity of diarrhea.  Hemoglobin normal.  Will update Dr. Leone Payor.   - Amada Jupiter, MD    [facility]

## 2023-07-06 NOTE — Telephone Encounter (Signed)
Pharmacy Patient Advocate Encounter  Received notification from [facility] that Prior Authorization for [facility] 550MG  has been APPROVED from 12.12.24 to 12.25.24. Ran test claim, Copay is $0. This test claim was processed through [facility]- copay amounts may vary at other pharmacies due to pharmacy/plan contracts, or as the patient moves through the different stages of their insurance plan.   PA #/Case ID/Reference #: [id number]

## 2023-07-06 NOTE — Telephone Encounter (Signed)
Pt is currently in the ED. She is active on mychart, sent pt message notifying her of approval.

## 2023-07-06 NOTE — ED Notes (Signed)
Patient transported to X-ray 

## 2023-07-10 ENCOUNTER — Telehealth: Payer: Self-pay | Admitting: Internal Medicine

## 2023-07-10 ENCOUNTER — Encounter: Payer: 59 | Admitting: Physical Therapy

## 2023-07-10 DIAGNOSIS — J4520 Mild intermittent asthma, uncomplicated: Secondary | ICD-10-CM

## 2023-07-11 ENCOUNTER — Other Ambulatory Visit: Payer: Self-pay | Admitting: Internal Medicine

## 2023-07-11 ENCOUNTER — Telehealth: Payer: Self-pay | Admitting: Internal Medicine

## 2023-07-11 ENCOUNTER — Other Ambulatory Visit: Payer: Self-pay | Admitting: Cardiology

## 2023-07-11 NOTE — Telephone Encounter (Signed)
Called and spoke with patient, advised I would get a message to Dr. Maple Hudson regarding the refill.  Advised that we would call her back once we received a message that he has sent the prescription to her pharmacy.  She verbalized understanding.  Dr. Maple Hudson, please advise.  Thank you.

## 2023-07-11 NOTE — Telephone Encounter (Signed)
Patient states needs refill for Xanax. Pharmacy is [facility] [address] Patient phone number is [phone].

## 2023-07-11 NOTE — Telephone Encounter (Signed)
Patient needs refill of Alprazolam 1mg   Pharmacy: [facility] [address]

## 2023-07-12 ENCOUNTER — Other Ambulatory Visit: Payer: Self-pay

## 2023-07-12 MED ORDER — ALPRAZOLAM 1 MG PO TABS: 1.0000 mg | ORAL_TABLET | Freq: Four times a day (QID) | ORAL | 3 refills | Status: DC | PRN

## 2023-07-12 NOTE — Telephone Encounter (Signed)
Alprazolam refilled.  

## 2023-07-12 NOTE — Telephone Encounter (Signed)
Lm x1 for the patient.

## 2023-07-13 ENCOUNTER — Other Ambulatory Visit: Payer: Self-pay | Admitting: Acute Care

## 2023-07-13 ENCOUNTER — Encounter: Payer: Self-pay | Admitting: *Deleted

## 2023-07-13 DIAGNOSIS — Z122 Encounter for screening for malignant neoplasm of respiratory organs: Secondary | ICD-10-CM

## 2023-07-13 DIAGNOSIS — Z87891 Personal history of nicotine dependence: Secondary | ICD-10-CM

## 2023-07-13 NOTE — Telephone Encounter (Signed)
Mychart msg the pt informing her that the alprazolam was refilled.

## 2023-07-14 MED ORDER — ALBUTEROL SULFATE (2.5 MG/3ML) 0.083% IN NEBU: INHALATION_SOLUTION | RESPIRATORY_TRACT | 11 refills | Status: DC

## 2023-07-14 NOTE — Telephone Encounter (Signed)
Called [facility] and they did not receive rx therefore resent.NFN.

## 2023-07-14 NOTE — Telephone Encounter (Signed)
Lm x1 for patient.  

## 2023-07-16 NOTE — Progress Notes (Unsigned)
Patient ID: Felicia Odom, female    DOB: Dec 18, 1955, 66 y.o.   MRN: [id number]  HPI F former smoker followed for COPD, Allergic rhinitis, complicated by Thymoma/ resected, anxiety Office spirometry- 01/29/15 Normal spirometry. FVC 2.74/81%, FEV1 2.11/79%, FEV1/FVC 0.77, FEF 25-75 percent 1.79 Office Spirometry 10/21/2015-slight restriction of exhaled volume, mild obstruction. FVC 2.69/78%, FEV1 2.04/76%, FEV1/FVC 0.76, FEF 25-75 percent 1.68/65% PFT 04/27/2016-minimal obstructive airways disease, minimal diffusion defect, insignificant response to bronchodilator. FVC 2.79/78%, FEV1 2.11/76%, ratio 0.76, FEF 25-75 percent 1.81/72%, TLC 92%, DLCO 76%. .----------------------------------------------------------------------------------------------------   03/21/23-67 year old female former smoker (36 pk yrs) followed for Asthma/Bronchitis/ COPD, allergic Rhinitis, complicated by Resection Thymoma/ TSGY, OCD/panic, Anxiety/ Depression, GERD, DM2, HTN, Eczema, Osteoarthritis, Hyperlipidemia, AFib/ Eliquis, dCHF, Aortic Atherosclerosis, -Neb albuterol , Ventolin hfa, Xanax 1 mg, Complains of cough with white/yellow mucus, no fever, feels weak "no energy" times a couple of days.  Chest was "popping and crackling" so she thought she ought to come and have me listen to it.  She has used her rescue inhaler and nebulizer. Newly diagnosed diabetes.  Has dieted off some weight.  Was on Ozempic but quit because of constipation.  Had successful cardiac ablation but continues Eliquis for now.  07/17/23- 67 year old female former smoker (36 pk yrs) followed for Asthma/Bronchitis/ COPD, allergic Rhinitis, complicated by Resection Thymoma/ TSGY, OCD/panic, Anxiety/ Depression, GERD, DM2, HTN, Eczema, Osteoarthritis, Hyperlipidemia, AFib/ Eliquis, dCHF, Aortic Atherosclerosis, -Neb albuterol , Ventolin hfa, Xanax 1 mg, CXR 07/06/23- IMPRESSION: No active cardiopulmonary disease. Discussed the use of AI  scribe software for clinical note transcription with the patient, who gave verbal consent to proceed.  History of Present Illness   The patient, with a history of IBS, presents with severe diarrhea described as 'muddy water.' She has been to the hospital twice for this issue, but was not admitted. She was prescribed Xifaxan, an antibiotic, which had to be approved by the care team. After starting the antibiotic, she had a 'plug of mucus' which she coughed up. She has four more days left of the antibiotic. She also reports a sore scar from a previous thymoma surgery in 2017. She has been noticing skin tags and spots on her skin, which she is concerned about. She also mentions that she has been having issues with her knee, but does not elaborate on this.     Review of Systems- see HPI + = positive Constitutional:  + weight loss, +night sweats, fevers, chills, fatigue, lassitude. HEENT:   No-  headaches, difficulty swallowing, tooth/dental problems, sore throat,       No-  sneezing, itching, ear ache, nasal congestion, post nasal drip,  CV:   chest pain, No-orthopnea, PND, swelling in lower extremities, anasarca, dizziness, palpitations Resp: + shortness of breath with exertion or at rest.              +productive cough,   non-productive cough,  No- coughing up of blood.            +change in color of mucus.  No- wheezing.   Skin: No-   rash or lesions. GI:    heartburn, indigestion, No-abdominal pain, nausea, vomiting,  GU:  MS:  + joint pain or swelling.  . Neuro-     nothing unusual Psych: change in mood or affect. +Chronic depression , + anxiety.  No memory loss.   Objective:   Physical Exam General- Alert, Oriented , Distress- none acute, + Obese.  Skin- rash-none, lesions- none, excoriation- none Lymphadenopathy- none  Head- + healed scar forehead            Eyes- Gross vision intact, PERRLA, conjunctivae clear secretions            Ears- Hearing, canals-normal            Nose- No-  rhinorrhea, no-Septal dev, polyps, erosion, perforation             Throat- Mallampati II , mucosa clear , drainage- none, tonsils- atrophic. +Missing teeth Neck- flexible , trachea midline, no stridor , thyroid nl, carotid no bruit Chest - symmetrical excursion , unlabored           Heart/CV- RRR ,  murmur+1S , no gallop, no rub, nl s1 s2                           - JVD- none , edema- none, stasis changes- none, varices- none           Lung- +bilateral wheeze,  unlabored, cough-none, dullness-none, rub- none,            Chest wall-  +R VATS scars s/p thymectomy. No rub. Abd-  Br/ Gen/ Rectal- Not done, not indicated Extrem-  Neuro- grossly intact to observation  Assessment and Plan    Diarrhea Severe diarrhea, possibly related to IBS. Recent improvement with Xifaxan. -Continue Xifaxan as prescribed.  Chronic Lung Disease Stable with no recent flare-ups. Recent mucus plug expectorated. -Continue Albuterol inhaler and nebulizer as needed.  Skin Lesions Multiple skin tags and senile keratoses. No signs of malignancy. -No intervention needed at this time.  Atrial Fibrillation Stable on medication. No recent episodes. -Continue current medication regimen.  Breast Concerns Soreness at old thymoma scar. No palpable lumps or bumps. -No intervention needed at this time.  General Health Maintenance -Annual lung cancer screening due in February 2025. -Follow-up appointment scheduled for November 13, 2023.

## 2023-07-17 ENCOUNTER — Ambulatory Visit: Payer: 59 | Admitting: Internal Medicine

## 2023-07-17 ENCOUNTER — Encounter: Payer: Self-pay | Admitting: Internal Medicine

## 2023-07-17 VITALS — BP 110/80 | HR 71 | Ht 65.0 in | Wt 188.6 lb

## 2023-07-17 DIAGNOSIS — J4521 Mild intermittent asthma with (acute) exacerbation: Secondary | ICD-10-CM

## 2023-07-17 NOTE — Patient Instructions (Addendum)
I don't see any signs of concern with your lungs.  You will be contacted about the next update for your screening CT chest.  Ok to schedule return in April as planned

## 2023-07-20 ENCOUNTER — Telehealth: Payer: Self-pay | Admitting: Internal Medicine

## 2023-07-20 NOTE — Telephone Encounter (Signed)
Patient called in to update Dr. Leone Payor & Viviann Spare, RN that since completing Xifaxan her symptoms have resolved. For the past 3 days she has had normal bowel movements. Hemorrhoids returned to normal state. She also asked if she needed to continue cholestyramine. Advised her to continue it as prescribed, unless she becomes constipated. Pt verbalized all understanding & asked that I make them both aware how much she appreciates them for all their help & advice!

## 2023-07-20 NOTE — Telephone Encounter (Signed)
Inbound call from patient stating she has been feeling better. Requesting to know if she should continue taking medication until 09/15/23 appointment. Please advise, thank you.

## 2023-07-24 ENCOUNTER — Encounter (HOSPITAL_COMMUNITY): Payer: Self-pay

## 2023-07-24 ENCOUNTER — Emergency Department (HOSPITAL_COMMUNITY): Payer: 59

## 2023-07-24 ENCOUNTER — Telehealth: Payer: Self-pay | Admitting: Cardiology

## 2023-07-24 ENCOUNTER — Other Ambulatory Visit: Payer: Self-pay

## 2023-07-24 ENCOUNTER — Emergency Department (HOSPITAL_COMMUNITY)
Admission: EM | Admit: 2023-07-24 | Discharge: 2023-07-24 | Disposition: A | Discharge status: Home / Self Care | Payer: 59 | Attending: Emergency Medicine | Admitting: Emergency Medicine

## 2023-07-24 ENCOUNTER — Emergency Department (HOSPITAL_BASED_OUTPATIENT_CLINIC_OR_DEPARTMENT_OTHER)
Admit: 2023-07-24 | Discharge: 2023-07-24 | Disposition: A | Discharge status: Home / Self Care | Payer: 59 | Attending: Emergency Medicine

## 2023-07-24 DIAGNOSIS — E119 Type 2 diabetes mellitus without complications: Secondary | ICD-10-CM | POA: Diagnosis not present

## 2023-07-24 DIAGNOSIS — J449 Chronic obstructive pulmonary disease, unspecified: Secondary | ICD-10-CM | POA: Insufficient documentation

## 2023-07-24 DIAGNOSIS — M79604 Pain in right leg: Secondary | ICD-10-CM | POA: Diagnosis not present

## 2023-07-24 DIAGNOSIS — M5412 Radiculopathy, cervical region: Secondary | ICD-10-CM | POA: Insufficient documentation

## 2023-07-24 DIAGNOSIS — Z7901 Long term (current) use of anticoagulants: Secondary | ICD-10-CM | POA: Diagnosis not present

## 2023-07-24 DIAGNOSIS — I10 Essential (primary) hypertension: Secondary | ICD-10-CM | POA: Diagnosis not present

## 2023-07-24 DIAGNOSIS — M79601 Pain in right arm: Secondary | ICD-10-CM | POA: Diagnosis present

## 2023-07-24 DIAGNOSIS — Z79899 Other long term (current) drug therapy: Secondary | ICD-10-CM | POA: Diagnosis not present

## 2023-07-24 LAB — CBC
HCT: 45.1 % (ref 36.0–46.0)
Hemoglobin: 15 g/dL (ref 12.0–15.0)
MCH: 29.2 pg (ref 26.0–34.0)
MCHC: 33.3 g/dL (ref 30.0–36.0)
MCV: 87.7 fL (ref 80.0–100.0)
Platelets: 332 10*3/uL (ref 150–400)
RBC: 5.14 MIL/uL — ABNORMAL HIGH (ref 3.87–5.11)
RDW: 13.2 % (ref 11.5–15.5)
WBC: 6.5 10*3/uL (ref 4.0–10.5)
nRBC: 0 % (ref 0.0–0.2)

## 2023-07-24 LAB — MAGNESIUM: Magnesium: 2.1 mg/dL (ref 1.7–2.4)

## 2023-07-24 LAB — BASIC METABOLIC PANEL
Anion gap: 11 (ref 5–15)
BUN: 12 mg/dL (ref 8–23)
CO2: 26 mmol/L (ref 22–32)
Calcium: 9.4 mg/dL (ref 8.9–10.3)
Chloride: 102 mmol/L (ref 98–111)
Creatinine, Ser: 0.83 mg/dL (ref 0.44–1.00)
GFR, Estimated: >60 mL/min (ref >60)
Glucose, Bld: 123 mg/dL — ABNORMAL HIGH (ref 70–99)
Potassium: 4.1 mmol/L (ref 3.5–5.1)
Sodium: 139 mmol/L (ref 135–145)

## 2023-07-24 LAB — TROPONIN I (HIGH SENSITIVITY)
Troponin I (High Sensitivity): 3 ng/L (ref <18)
Troponin I (High Sensitivity): 4 ng/L (ref <18)

## 2023-07-24 MED ORDER — PREDNISONE 10 MG (21) PO TBPK: ORAL_TABLET | Freq: Every day | ORAL | 0 refills | Status: DC

## 2023-07-24 MED ORDER — MORPHINE SULFATE (PF) 4 MG/ML IV SOLN
4.0000 mg | Freq: Once | INTRAVENOUS | Status: AC
  Administered 2023-07-24: 4 mg via INTRAVENOUS
  Filled 2023-07-24: qty 1

## 2023-07-24 NOTE — Progress Notes (Signed)
Right lower extremity venous duplex has been completed. Preliminary results can be found in CV Proc through chart review.  Results were given to Luther Hearing PA.  07/24/23 1:25 PM Olen Cordial RVT

## 2023-07-24 NOTE — Discharge Instructions (Addendum)
Please use Tylenol or ibuprofen for pain.  You may use 600 mg ibuprofen every 6 hours or 1000 mg of Tylenol every 6 hours.  You may choose to alternate between the 2.  This would be most effective.  Not to exceed 4 g of Tylenol within 24 hours.  Not to exceed 3200 mg ibuprofen 24 hours.  You can take the steroids as prescribed for the entire course, please call the neurosurgeon his current information provided above to schedule a follow-up.

## 2023-07-24 NOTE — ED Notes (Signed)
Pt to radiology.

## 2023-07-24 NOTE — Telephone Encounter (Signed)
Patient identification verified by 2 forms. Shade Flood, RN     Called and spoke to patient  Patient states: Experiencing chest heaviness, numbness in arm and fingers, SOB, sweating. Symptoms have gotten worst over the last week. Patient reports she usually feels this way when he potassium is low. States she began taking liquid IV three days ago but symptoms are still the same.  Patient provided blood sugar level of 162. No other vitals available at this time.   Patient denies: Does not take nitroglycerin. No nausea or vomiting.              Interventions/Plan: Recommended patient go to ER. Patient's son was present in the home as well. Recommended she call EMS or have her son take her to the ER.    Reviewed ED warning signs/precautions  Patient agrees with plan, no questions at this time

## 2023-07-24 NOTE — ED Triage Notes (Signed)
Pt c.o right sided chest pain for the past week with right arm numbness. Pt c.o worsening pain this morning. Pt recently getting over a GI virus, states she had diarrhea for the past 2 weeks.

## 2023-07-24 NOTE — Telephone Encounter (Signed)
   Pt c/o of Chest Pain: STAT if active CP, including tightness, pressure, jaw pain, radiating pain to shoulder/upper arm/back, CP unrelieved by Nitro. Symptoms reported of SOB, nausea, vomiting, sweating.  1. Are you having CP right now? Yes - Heaviness    2. Are you experiencing any other symptoms (ex. SOB, nausea, vomiting, sweating)? Sweating   3. Is your CP continuous or coming and going? Continuous   4. Have you taken Nitroglycerin? No   5. How long have you been experiencing CP? week    6. If NO CP at time of call then end call with telling Pt to call back or call 911 if Chest pain returns prior to return call from triage team.  Call transferred

## 2023-07-24 NOTE — ED Provider Notes (Signed)
[place] [facility] AT [facility] Provider Note   CSN: [id number] Arrival date & time: 07/24/23  6237     History  Chief Complaint  Patient presents with   Chest Pain   Numbness    ZENIA OCHOA is a 67 y.o. female past medical history significant for OCD, panic disorder, GERD, degenerative disc disease, hypertension, cannabis use, COPD, diabetes who presents with concern for right sided chest pain, right arm pain, intermittent numbness.  Patient denies any recent injury to the neck.  She denies any left-sided chest pain, nausea, vomiting.  She reports that she had a heart ablation earlier this year.   Chest Pain      Home Medications Prior to Admission medications   Medication Sig Start Date End Date Taking? Authorizing Provider  predniSONE (STERAPRED UNI-PAK 21 TAB) 10 MG (21) TBPK tablet Take by mouth daily. Take 6 tabs by mouth daily  for 2 days, then 5 tabs for 2 days, then 4 tabs for 2 days, then 3 tabs for 2 days, 2 tabs for 2 days, then 1 tab by mouth daily for 2 days 07/24/23  Yes Tamika Nou H, PA-C  ACCU-CHEK GUIDE test strip USE 1 TO CHECK BLOOD SUGARS ONCE A WEEK 11/25/22   [provider]  acetaminophen (TYLENOL) 500 MG tablet Take 1,000 mg by mouth every 6 (six) hours as needed for fever or headache.    [provider]  albuterol (PROVENTIL) (2.5 MG/3ML) 0.083% nebulizer solution USE 1 VIAL IN NEBULIZER EVERY 6 HOURS - And As Needed 07/14/23   Jetty Duhamel D, MD  albuterol (VENTOLIN HFA) 108 (90 Base) MCG/ACT inhaler INHALE 1 TO 2 PUFFS INTO THE LUNGS EVERY 4 HOURS AS NEEDED FOR WHEEZING OR SHORTNESS OF BREATH 04/20/23   Young, Rennis Chris, MD  ALPRAZolam Prudy Feeler) 1 MG tablet Take 1 tablet (1 mg total) by mouth every 6 (six) hours as needed. for anxiety 07/12/23   Jetty Duhamel D, MD  cholestyramine Lanetta Inch) 4 g packet Take 1 packet (4 g total) by mouth daily with lunch. 06/28/23   Iva Boop, MD  clindamycin  (CLEOCIN T) 1 % external solution Apply 1 Application topically as needed. 06/08/23   [provider]  dicyclomine (BENTYL) 20 MG tablet Take 1 tablet (20 mg total) by mouth every 6 (six) hours as needed for spasms (abdominal pain). 07/03/23   Iva Boop, MD  diltiazem (CARDIZEM CD) 120 MG 24 hr capsule TAKE 1 CAPSULE BY MOUTH EVERY DAY 07/11/23   Rollene Rotunda, MD  diltiazem (CARDIZEM) 30 MG tablet TAKE 1 TABLET (30 MG TOTAL) BY MOUTH EVERY 4 (FOUR) HOURS AS NEEDED (ELEVATED HEART RATE). 04/14/23   Rollene Rotunda, MD  ELIQUIS 5 MG TABS tablet TAKE 1 TABLET BY MOUTH TWICE A DAY 04/14/23   Revankar, Aundra Dubin, MD  esomeprazole (NEXIUM) 20 MG capsule Take 20 mg by mouth daily as needed (acid reflux). 08/19/22   [provider]  furosemide (LASIX) 40 MG tablet Take 1 tablet by mouth twice daily 02/10/23   Rollene Rotunda, MD  HYDROcodone-acetaminophen (NORCO/VICODIN) 5-325 MG tablet Take 0.5 tablets by mouth every 4 (four) hours as needed for severe pain (pain score 7-10). 07/06/23   Rosezella Rumpf, PA-C  Multiple Vitamin (MULTIVITAMIN) tablet Take 1 tablet by mouth daily.    [provider]  rifaximin (XIFAXAN) 550 MG TABS tablet Take 1 tablet (550 mg total) by mouth 3 (three) times daily. 06/30/23   Stan Head  E, MD  rosuvastatin (CRESTOR) 5 MG tablet Take 1 tablet (5 mg total) by mouth every other day. 12/26/22 06/30/23  Rollene Rotunda, MD  valACYclovir (VALTREX) 1000 MG tablet as needed. 11/21/22   [provider]  Vitamin D, Ergocalciferol, (DRISDOL) 1.25 MG (50000 UNIT) CAPS capsule Take 50,000 Units by mouth every 7 (seven) days. 11/21/22   [provider]      Allergies    Sulfa antibiotics    Review of Systems   Review of Systems  Cardiovascular:  Positive for chest pain.  All other systems reviewed and are negative.   Physical Exam Updated Vital Signs BP 114/65 (BP Location: Left Arm)   Pulse 68   Temp 98.6 F (37 C) (Oral)   Resp 18    SpO2 98%  Physical Exam Vitals and nursing note reviewed.  Constitutional:      General: She is not in acute distress.    Appearance: Normal appearance.  HENT:     Head: Normocephalic and atraumatic.  Eyes:     General:        Right eye: No discharge.        Left eye: No discharge.  Cardiovascular:     Rate and Rhythm: Normal rate and regular rhythm.     Heart sounds: No murmur heard.    No friction rub. No gallop.  Pulmonary:     Effort: Pulmonary effort is normal.     Breath sounds: Normal breath sounds.  Abdominal:     General: Bowel sounds are normal.     Palpations: Abdomen is soft.  Musculoskeletal:     Comments: On exam patient with some tenderness palpation of the neck on the right, but intact strength 5/5 bilateral upper extremities.  She has normal sensation of the right upper extremity on my exam she reports the numbness is intermittent in nature.  Skin:    General: Skin is warm and dry.     Capillary Refill: Capillary refill takes less than 2 seconds.  Neurological:     Mental Status: She is alert and oriented to person, place, and time.  Psychiatric:        Mood and Affect: Mood normal.        Behavior: Behavior normal.     ED Results / Procedures / Treatments   Labs (all labs ordered are listed, but only abnormal results are displayed) Labs Reviewed  BASIC METABOLIC PANEL - Abnormal; Notable for the following components:      Result Value   Glucose, Bld 123 (*)    All other components within normal limits  CBC - Abnormal; Notable for the following components:   RBC 5.14 (*)    All other components within normal limits  MAGNESIUM  TROPONIN I (HIGH SENSITIVITY)  TROPONIN I (HIGH SENSITIVITY)    EKG None  Radiology CT Cervical Spine Wo Contrast Result Date: 07/24/2023 CLINICAL DATA:  Cervical radiculopathy. Right arm numbness and right-sided chest pain. EXAM: CT CERVICAL SPINE WITHOUT CONTRAST TECHNIQUE: Multidetector CT imaging of the cervical  spine was performed without intravenous contrast. Multiplanar CT image reconstructions were also generated. RADIATION DOSE REDUCTION: This exam was performed according to the departmental dose-optimization program which includes automated exposure control, adjustment of the mA and/or kV according to patient size and/or use of iterative reconstruction technique. COMPARISON:  Cervical spine MRI 03/07/2017 FINDINGS: Alignment: Mild right convex curvature of the lower cervical spine. Mild chronic reversal of the normal cervical lordosis with trace anterolisthesis of C3 on  C4 and C4 on C5. Skull base and vertebrae: No acute fracture or suspicious osseous lesion. Advanced disc degeneration at C5-6, C6-7, and T1-2 with severe disc space narrowing and degenerative endplate changes at these levels, including prominent degenerative sclerosis at C5-6. Soft tissues and spinal canal: There is no evidence of an acute infarct, intracranial hemorrhage, mass, midline shift, or extra-axial fluid collection. Disc levels: C2-3: Mild disc bulging, uncovertebral spurring, and severe right and mild left facet arthrosis result in mild right neural foraminal stenosis without evidence of significant spinal stenosis. C3-4: A central disc protrusion, asymmetric left uncovertebral spurring, and mild-to-moderate right and severe left facet arthrosis result in mild spinal stenosis and mild-to-moderate left neural foraminal stenosis. C4-5: Uncovertebral spurring and severe bilateral facet arthrosis result in mild bilateral neural foraminal stenosis without evidence of significant spinal stenosis. C5-6: A broad-based posterior disc osteophyte complex and asymmetric left uncovertebral spurring result in moderate left neural foraminal stenosis without evidence of significant spinal stenosis. C6-7: A broad-based posterior disc osteophyte complex results in moderate bilateral neural foraminal stenosis without evidence of significant spinal stenosis.  C7-T1: Mild right and moderate left facet arthrosis without evidence of significant stenosis. Degenerative changes have overall mildly progressed from the 2018 MRI. Upper chest: Clear lung apices. Other: Hypodense right thyroid nodule measuring over 2.5 cm in diameter, previously evaluated by thyroid ultrasound in 2023. IMPRESSION: 1. Advanced cervical disc and facet degeneration with moderate neural foraminal stenosis at C5-6 and C6-7. 2. Mild spinal stenosis at C3-4. Electronically Signed   By: Sebastian Ache M.D.   On: 07/24/2023 14:09   VAS [place] LOWER EXTREMITY VENOUS (DVT) (7a-7p) Result Date: 07/24/2023  Lower Venous DVT Study Patient Name:  ANAPAULA NORDLAND  Date of Exam:   07/24/2023 Medical Rec #: [id number]             Accession #:    [id number] Date of Birth: 1955/11/26             Patient Gender: F Patient Age:   35 years Exam Location:  [facility] Procedure:      VAS [place] LOWER EXTREMITY VENOUS (DVT) Referring Phys: Nozomi Mettler --------------------------------------------------------------------------------  Indications: Pain.  Risk Factors: None identified. Limitations: Poor ultrasound/tissue interface. Comparison Study: No prior studies. Performing Technologist: Chanda Busing RVT  Examination Guidelines: A complete evaluation includes B-mode imaging, spectral Doppler, color Doppler, and power Doppler as needed of all accessible portions of each vessel. Bilateral testing is considered an integral part of a complete examination. Limited examinations for reoccurring indications may be performed as noted. The reflux portion of the exam is performed with the patient in reverse Trendelenburg.  +---------+---------------+---------+-----------+----------+--------------+ RIGHT    CompressibilityPhasicitySpontaneityPropertiesThrombus Aging +---------+---------------+---------+-----------+----------+--------------+ CFV      Full           Yes      Yes                                  +---------+---------------+---------+-----------+----------+--------------+ SFJ      Full                                                        +---------+---------------+---------+-----------+----------+--------------+ FV Prox  Full                                                        +---------+---------------+---------+-----------+----------+--------------+  FV Mid   Full                                                        +---------+---------------+---------+-----------+----------+--------------+ FV DistalFull                                                        +---------+---------------+---------+-----------+----------+--------------+ PFV      Full                                                        +---------+---------------+---------+-----------+----------+--------------+ POP      Full           Yes      Yes                                 +---------+---------------+---------+-----------+----------+--------------+ PTV      Full                                                        +---------+---------------+---------+-----------+----------+--------------+ PERO     Full                                                        +---------+---------------+---------+-----------+----------+--------------+   +----+---------------+---------+-----------+----------+--------------+ LEFTCompressibilityPhasicitySpontaneityPropertiesThrombus Aging +----+---------------+---------+-----------+----------+--------------+ CFV Full           Yes      Yes                                 +----+---------------+---------+-----------+----------+--------------+    Summary: RIGHT: - There is no evidence of deep vein thrombosis in the lower extremity.  - No cystic structure found in the popliteal fossa.  LEFT: - No evidence of common femoral vein obstruction.   *See table(s) above for measurements and observations.    Preliminary    DG Chest 2  View Result Date: 07/24/2023 CLINICAL DATA:  Chest pain. EXAM: CHEST - 2 VIEW COMPARISON:  07/06/2023. FINDINGS: Bilateral lung fields are clear. Bilateral costophrenic angles are clear. Normal cardio-mediastinal silhouette. No acute osseous abnormalities. The soft tissues are within normal limits. IMPRESSION: *No active cardiopulmonary disease. Electronically Signed   By: Jules Schick M.D.   On: 07/24/2023 09:43    Procedures Procedures    Medications Ordered in ED Medications  morphine (PF) 4 MG/ML injection 4 mg (4 mg Intravenous Given 07/24/23 1150)    ED Course/ Medical Decision Making/ A&P Clinical Course as of 07/24/23 1427  Mon Jul 24, 2023  1326 Negative for DVT [CP]    Clinical Course User  Index [CP] Olene Floss, PA-C                                 Medical Decision Making Amount and/or Complexity of Data Reviewed Labs: ordered. Radiology: ordered.   This patient is a 67 y.o. female  who presents to the ED for concern of chest pain, neck pain.   Differential diagnoses prior to evaluation: The emergent differential diagnosis includes, but is not limited to,  ACS, AAS, PE, Mallory-Weiss, Boerhaave's, Pneumonia, acute bronchitis, asthma or COPD exacerbation, anxiety, MSK pain or traumatic injury to the chest, acid reflux versus other, given the right sided nature of her symptoms, intermittent numbness of the right arm I do have some high clinical suspicion for cervical radiculopathy especially with her previous known history of degenerative disc disease in the cervical spine. This is not an exhaustive differential.   Past Medical History / Co-morbidities / Social History: OCD, panic disorder, GERD, degenerative disc disease, hypertension, cannabis use, COPD, diabetes   Additional history: Chart reviewed. Pertinent results include: Reviewed outpatient cardiology, gastroenterology, pulmonology visits, lab work, imaging from recent previous emergency department  visits  Physical Exam: Physical exam performed. The pertinent findings include: No focal neurologic deficits on my exam, she has some tenderness of the neck and no sensory deficits of the right arm, but I suspect probable cervical radiculopathy.  Vital signs stable in the emergency department including no hypertension.  Lab Tests/Imaging studies: I personally interpreted labs/imaging and the pertinent results include:  Troponin normal x 2 in context of atypical right-sided chest pain, CBC unremarkable, BMP unremarkable, normal magnesium.  I independently interpreted CT cervical spine without contrast, plain film chest x-ray, lower extremity DVT study on the right which showed no evidence of DVT, no intrathoracic abnormality, she has significant degenerative disc changes, facet degeneration and cervical stenosis in the C-spine which is likely contributing to what on physical exam seems consistent with radiculopathy. I agree with the radiologist interpretation.  Cardiac monitoring: EKG obtained and interpreted by myself and attending physician which shows: NSR, no acute ST-T changes   Medications: I ordered medication including morphine for pain, will plan to discharge with prednisone taper for cervical radiculopathy, neurosurgical follow-up given.  Return precautions given.  I have reviewed the patients home medicines and have made adjustments as needed.   Disposition: After consideration of the diagnostic results and the patients response to treatment, I feel that patient is stable for discharge with plan as above .   emergency department workup does not suggest an emergent condition requiring admission or immediate intervention beyond what has been performed at this time. The plan is: as above. The patient is safe for discharge and has been instructed to return immediately for worsening symptoms, change in symptoms or any other concerns.  Final Clinical Impression(s) / ED Diagnoses Final  diagnoses:  Cervical radiculopathy    Rx / DC Orders ED Discharge Orders          Ordered    predniSONE (STERAPRED UNI-PAK 21 TAB) 10 MG (21) TBPK tablet  Daily        07/24/23 1423              Olene Floss, PA-C 07/24/23 1427    Rolan Bucco, MD 07/24/23 1444

## 2023-07-25 ENCOUNTER — Telehealth: Payer: Self-pay | Admitting: Cardiology

## 2023-07-25 ENCOUNTER — Other Ambulatory Visit: Payer: Self-pay | Admitting: Cardiology

## 2023-07-25 DIAGNOSIS — I480 Paroxysmal atrial fibrillation: Secondary | ICD-10-CM

## 2023-07-25 NOTE — Telephone Encounter (Signed)
Pt had a ov 12/23 nfn closing encounter

## 2023-07-25 NOTE — Telephone Encounter (Signed)
Called and spoke to patient. Notified patient Eliquis sent to pharmacy. Patient was thankful.

## 2023-07-25 NOTE — Telephone Encounter (Signed)
 Eliquis 5mg  refill request received. Patient is 67 years old, weight-85.5kg, Crea- 0.83 on 07/24/23, Diagnosis-Afib, and last seen by Dr. Antoine Poche on 06/29/23. Dose is appropriate based on dosing criteria. Will send in refill to requested pharmacy.

## 2023-07-25 NOTE — Telephone Encounter (Signed)
Patient would like to have Dr. Kem Parkinson name removed from her Eliquis prescription if possible.

## 2023-07-27 ENCOUNTER — Telehealth: Payer: Self-pay | Admitting: Cardiology

## 2023-07-27 NOTE — Telephone Encounter (Signed)
 Pt states she went to the hospital on Monday and wants to discuss some of the things the ED said to her. Please advise

## 2023-07-27 NOTE — Telephone Encounter (Signed)
 Called and spoke to patient. Verified name and DOB. Patient stated she was seen in the ED on 12/30 for chest pressure and neck pain. She states she was told the chest pressure/pain was coming from her neck pain. She said she was told that her images were able normal and recommended she see a neurosurgeon. She was also told she had a mild heart attack at some point. Patient was started on Prednisone . She is asking if Dr Lavona can review her ED visit and explain more or if it okay for her to wait until her visit with him on 1/22 to discuss. Please advise.

## 2023-07-28 NOTE — Telephone Encounter (Signed)
 Rollene Rotunda, MD  Cv Div Nl Ruta Hinds hours ago (8:38 PM)    Troponins were normal and EKG was not diagnostic of a previous MI.  We can discuss further at the office appt as well.

## 2023-07-28 NOTE — Telephone Encounter (Signed)
 Patient identification verified by 2 forms. Bertina Cooks, RN    Called and spoke to patient  Relayed provider message below  Advised patient to keep 1/22 OV for follow up  Reviewed ED warning signs/precautions  Patient verbalized understanding, no questions or concerns at this time

## 2023-08-01 ENCOUNTER — Other Ambulatory Visit: Payer: Self-pay | Admitting: Internal Medicine

## 2023-08-02 ENCOUNTER — Encounter: Payer: Self-pay | Admitting: Emergency Medicine

## 2023-08-02 ENCOUNTER — Ambulatory Visit
Admission: EM | Admit: 2023-08-02 | Discharge: 2023-08-02 | Disposition: A | Discharge status: Home / Self Care | Payer: 59 | Attending: Physician Assistant | Admitting: Physician Assistant

## 2023-08-02 DIAGNOSIS — Z87891 Personal history of nicotine dependence: Secondary | ICD-10-CM | POA: Diagnosis not present

## 2023-08-02 DIAGNOSIS — J029 Acute pharyngitis, unspecified: Secondary | ICD-10-CM | POA: Insufficient documentation

## 2023-08-02 DIAGNOSIS — Z20822 Contact with and (suspected) exposure to covid-19: Secondary | ICD-10-CM

## 2023-08-02 DIAGNOSIS — R300 Dysuria: Secondary | ICD-10-CM | POA: Diagnosis present

## 2023-08-02 DIAGNOSIS — R059 Cough, unspecified: Secondary | ICD-10-CM | POA: Diagnosis not present

## 2023-08-02 DIAGNOSIS — N3000 Acute cystitis without hematuria: Secondary | ICD-10-CM | POA: Diagnosis not present

## 2023-08-02 DIAGNOSIS — R0981 Nasal congestion: Secondary | ICD-10-CM | POA: Insufficient documentation

## 2023-08-02 DIAGNOSIS — J069 Acute upper respiratory infection, unspecified: Secondary | ICD-10-CM | POA: Diagnosis not present

## 2023-08-02 LAB — POCT URINALYSIS DIP (MANUAL ENTRY)
Bilirubin, UA: NEGATIVE
Blood, UA: NEGATIVE
Glucose, UA: 100 mg/dL — AB
Ketones, POC UA: NEGATIVE mg/dL
Leukocytes, UA: NEGATIVE
Nitrite, UA: POSITIVE — AB
Protein Ur, POC: NEGATIVE mg/dL
Spec Grav, UA: 1.015 (ref 1.010–1.025)
Urobilinogen, UA: 1 U/dL
pH, UA: 5.5 (ref 5.0–8.0)

## 2023-08-02 LAB — POCT RAPID STREP A (OFFICE): Rapid Strep A Screen: NEGATIVE

## 2023-08-02 LAB — POCT FASTING CBG KUC MANUAL ENTRY: POCT Glucose (KUC): 121 mg/dL — AB (ref 70–99)

## 2023-08-02 MED ORDER — AMOXICILLIN-POT CLAVULANATE 875-125 MG PO TABS: 1.0000 | ORAL_TABLET | Freq: Two times a day (BID) | ORAL | 0 refills | Status: DC

## 2023-08-02 NOTE — ED Triage Notes (Signed)
 Pt reports sore throat, congestion, and cough x2 days. Believes she's had fevers at home. Reports her son tested positive for covid this week. Pt also reports dysuria with abdominal pressure over the past week since finishing an antibiotic course.

## 2023-08-02 NOTE — ED Provider Notes (Signed)
 [facility]    CSN: [id number] Arrival date & time: 08/02/23  0912      History   Chief Complaint Chief Complaint  Patient presents with   Sore Throat   Nasal Congestion   Dysuria    HPI Felicia Odom is a 68 y.o. female.   Patient here today for evaluation of sore throat, congestion and cough the past 2 days.  Her son who was tested positive for COVID yesterday.  She reports she has had some dysuria as well with abdominal pressure of the last week.  She did just admission antibiotic course.  She is unsure of fevers but states she has felt as if she has had fever at home.  She has taken over-the-counter medication without resolution.  The history is provided by the patient.  Sore Throat Pertinent negatives include no abdominal pain and no shortness of breath.  Dysuria Associated symptoms: fever (subjective ?)   Associated symptoms: no abdominal pain, no nausea and no vomiting     Past Medical History:  Diagnosis Date   Anxiety    Aortic atherosclerosis (HCC) 10/06/2021   Arthritis    neck   Cannabis dependence with current use (HCC) 07/26/2016   Chronic back pain    Chronic bronchitis (HCC)    Chronic diastolic CHF (congestive heart failure) (HCC)    Chronic neck pain    Cocaine abuse (HCC) 04/05/2021   COPD mixed type (HCC) 02/18/2019   Office Spirometry 10/21/2015-slight restriction of exhaled volume, mild obstruction. FVC 2.69/78%, FEV1 2.04/76%, FEV1/FVC 0.76, FEF 25-75 percent 1.68/65% PFT 04/27/2016-minimal obstructive airways disease, minimal diffusion defect, insignificant response to bronchodilator. FVC 2.79/78%, FEV1 2.11/76%, ratio 0.76, FEF 25-75 percent 1.81/72%, TLC 92%, DLCO 76%.   DDD (degenerative disc disease), cervical 06/15/2015   Diverticulosis    Eczema 11/29/2017   Encounter for long-term opiate analgesic use 07/06/2017   Essential hypertension 09/25/2015   GERD (gastroesophageal reflux disease)    takes Nexium  daily    Helicobacter pylori gastritis 08/2019   Tx and eradicated   Hyperlipidemia    not on any meds bc refused to pick up from pharmacy   Long-term current use of opiate analgesic 04/05/2021   Moderate episode of recurrent major depressive disorder (HCC) 01/28/2020   Obesity (BMI 30.0-34.9) 12/16/2021   Obsessive-compulsive disorder 06/08/2007   Qualifier: Diagnosis of  By: Tanda Milling     PAF (paroxysmal atrial fibrillation) (HCC) 12/16/2021   PANIC DISORDER 06/08/2007   Qualifier: Diagnosis of  By: Tanda Milling     Pneumonia    2016   PONV (postoperative nausea and vomiting)    Primary osteoarthritis involving multiple joints 01/21/2015   Seborrheic dermatitis of scalp 02/11/2015   Seizures (HCC)    in the 80's   Thyroid  nodule 07/30/2020   IMPRESSION: No significant interval change of bilateral thyroid  nodules.   The above is in keeping with the ACR TI-RADS recommendations - J Am Coll Radiol 2017;14:587-595.     Electronically Signed   By: Aliene Lloyd M.D.   On: 08/17/2020 14:18   Type II diabetes mellitus with manifestations (HCC) 02/18/2019   The 10-year ASCVD risk score Verdon DC Jr., et al., 2013) is: 9.1%   Values used to calculate the score:     Age: 30 years     Sex: Female     Is Non-Hispanic African American: No     Diabetic: Yes     Tobacco smoker: No     Systolic Blood  Pressure: 138 mmHg     Is BP treated: No     HDL Cholesterol: 46.2 mg/dL     Total Cholesterol: 180 mg/dL    Patient Active Problem List   Diagnosis Date Noted   Hypokalemia 06/04/2022   Hypercoagulable state due to paroxysmal atrial fibrillation (HCC) 04/15/2022   Mild aortic stenosis 03/10/2022   Obesity (BMI 30.0-34.9) 12/16/2021   PAF (paroxysmal atrial fibrillation) (HCC) 12/16/2021   Chest pain of uncertain etiology 10/06/2021   Aortic atherosclerosis (HCC) 10/06/2021   Arthritis 10/05/2021   Chronic back pain 10/05/2021   Chronic bronchitis (HCC) 10/05/2021   Chronic neck pain 10/05/2021    Diverticulosis 10/05/2021   History of blood transfusion 10/05/2021   Hyperlipidemia 10/05/2021   Joint pain 10/05/2021   Joint swelling 10/05/2021   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   Nocturia 10/05/2021   Pneumonia 10/05/2021   PONV (postoperative nausea and vomiting) 10/05/2021   Seizures (HCC) 10/05/2021   Right calf pain    Anxiety    Chronic diastolic CHF (congestive heart failure) (HCC)    Atrial fibrillation with RVR (HCC) 09/24/2021   Muscle spasm 08/02/2021   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/08/2021   URI (upper respiratory infection) 07/08/2021   Long-term current use of opiate analgesic 04/05/2021   Cocaine abuse (HCC) 04/05/2021   Thyroid  nodule 07/30/2020   Moderate episode of recurrent major depressive disorder (HCC) 01/28/2020   Routine general medical examination at a health care facility 01/28/2020   Helicobacter pylori gastritis 08/2019   COPD mixed type (HCC) 02/18/2019   Type II diabetes mellitus with manifestations (HCC) 02/18/2019   Former tobacco use 10/01/2018   Eczema 11/29/2017   Encounter for long-term opiate analgesic use 07/06/2017   Cannabis dependence with current use (HCC) 07/26/2016   Essential hypertension 09/25/2015   DDD (degenerative disc disease), cervical 06/15/2015   Seborrheic dermatitis of scalp 02/11/2015   Primary osteoarthritis involving multiple joints 01/21/2015   History of colonic polyps 01/21/2015   Hyperlipidemia with target LDL less than 100 01/21/2015   Atypical chest pain 05/08/2013   GERD (gastroesophageal reflux disease) 05/08/2013   Atherosclerosis of aorta (HCC) 05/08/2013   Allergic rhinitis 10/01/2010   PANIC DISORDER 06/08/2007   Obsessive-compulsive disorder 06/08/2007    Past Surgical History:  Procedure Laterality Date   ATRIAL FIBRILLATION ABLATION N/A 09/08/2022   Procedure: ATRIAL FIBRILLATION ABLATION;  Surgeon: Inocencio Soyla Lunger, MD;  Location: [facility];  Service: Cardiovascular;  Laterality: N/A;   COLONOSCOPY      ECTOPIC PREGNANCY SURGERY     x 2    ESOPHAGOGASTRODUODENOSCOPY     LAPAROSCOPIC LYSIS INTESTINAL ADHESIONS     x 2   RESECTION OF MEDIASTINAL MASS N/A 06/10/2016   Procedure: RESECTION OF MEDIASTINAL MASS;  Surgeon: Dallas KATHEE Jude, MD;  Location[facility];  Service: Thoracic;  Laterality: N/A;   VIDEO ASSISTED THORACOSCOPY Right 06/10/2016   Procedure: VIDEO ASSISTED THORACOSCOPY WITH PLACEMENT OF ON ELINORE GRAFT;  Surgeon: Dallas KATHEE Jude, MD;  Location: [facility] OR;  Service: Thoracic;  Laterality: Right;   VIDEO BRONCHOSCOPY N/A 06/10/2016   Procedure: VIDEO BRONCHOSCOPY;  Surgeon: Dallas KATHEE Jude, MD;  Location: [facility] OR;  Service: Thoracic;  Laterality: N/A;    OB History     Gravida  5   Para  1   Term  1   Preterm      AB  4   Living  1      SAB  3   IAB  Ectopic      Multiple      Live Births  1            Home Medications    Prior to Admission medications   Medication Sig Start Date End Date Taking? Authorizing Provider  ACCU-CHEK GUIDE test strip USE 1 TO CHECK BLOOD SUGARS ONCE A WEEK 11/25/22  Yes [provider]  Accu-Chek Softclix Lancets lancets 3 (three) times daily. 07/01/23  Yes [provider]  acetaminophen  (TYLENOL ) 500 MG tablet Take 1,000 mg by mouth every 6 (six) hours as needed for fever or headache.   Yes [provider]  albuterol  (PROVENTIL ) (2.5 MG/3ML) 0.083% nebulizer solution USE 1 VIAL IN NEBULIZER EVERY 6 HOURS - And As Needed 07/14/23  Yes Young, Reggy D, MD  albuterol  (VENTOLIN  HFA) 108 (90 Base) MCG/ACT inhaler INHALE 1 TO 2 PUFFS INTO THE LUNGS EVERY 4 HOURS AS NEEDED FOR WHEEZING OR SHORTNESS OF BREATH 04/20/23  Yes Young, Clinton D, MD  ALPRAZolam  (XANAX ) 1 MG tablet Take 1 tablet (1 mg total) by mouth every 6 (six) hours as needed. for anxiety 07/12/23  Yes Young, Reggy D, MD  apixaban  (ELIQUIS ) 5 MG TABS tablet Take 1 tablet by mouth twice daily 07/25/23  Yes Hochrein, Lynwood, MD  diltiazem   (CARDIZEM  CD) 120 MG 24 hr capsule TAKE 1 CAPSULE BY MOUTH EVERY DAY 07/11/23  Yes Lavona Lynwood, MD  furosemide  (LASIX ) 40 MG tablet Take 1 tablet by mouth twice daily 02/10/23  Yes Lavona Lynwood, MD  Multiple Vitamin (MULTIVITAMIN) tablet Take 1 tablet by mouth daily.   Yes [provider]  predniSONE  (STERAPRED UNI-PAK 21 TAB) 10 MG (21) TBPK tablet Take by mouth daily. Take 6 tabs by mouth daily  for 2 days, then 5 tabs for 2 days, then 4 tabs for 2 days, then 3 tabs for 2 days, 2 tabs for 2 days, then 1 tab by mouth daily for 2 days 07/24/23  Yes Prosperi, Christian H, PA-C  rifaximin  (XIFAXAN ) 550 MG TABS tablet Take 1 tablet (550 mg total) by mouth 3 (three) times daily. 06/30/23  Yes Avram Lupita BRAVO, MD  cholestyramine  (QUESTRAN ) 4 g packet Take 1 packet (4 g total) by mouth daily with lunch. Patient not taking: Reported on 08/02/2023 06/28/23   Avram Lupita BRAVO, MD  clindamycin (CLEOCIN T) 1 % external solution Apply 1 Application topically as needed. Patient not taking: Reported on 08/02/2023 06/08/23   [provider]  dicyclomine  (BENTYL ) 20 MG tablet Take 1 tablet (20 mg total) by mouth every 6 (six) hours as needed for spasms (abdominal pain). 07/03/23   Avram Lupita BRAVO, MD  diltiazem  (CARDIZEM ) 30 MG tablet TAKE 1 TABLET (30 MG TOTAL) BY MOUTH EVERY 4 (FOUR) HOURS AS NEEDED (ELEVATED HEART RATE). 04/14/23   Lavona Lynwood, MD  esomeprazole  (NEXIUM ) 20 MG capsule Take 20 mg by mouth daily as needed (acid reflux). 08/19/22   [provider]  HYDROcodone -acetaminophen  (NORCO/VICODIN) 5-325 MG tablet Take 0.5 tablets by mouth every 4 (four) hours as needed for severe pain (pain score 7-10). Patient not taking: Reported on 08/02/2023 07/06/23   Alva Larraine FALCON, PA-C  predniSONE  (DELTASONE ) 10 MG tablet Take by mouth. Patient not taking: Reported on 08/02/2023 07/24/23   [provider]  rosuvastatin  (CRESTOR ) 20 MG tablet Take 20 mg by mouth daily. 06/29/23    [provider]  rosuvastatin  (CRESTOR ) 5 MG tablet Take 1 tablet (5 mg total) by mouth every other day.  12/26/22 06/30/23  Lavona Agent, MD  spironolactone  (ALDACTONE ) 25 MG tablet Take 25 mg by mouth 2 (two) times daily. Patient not taking: Reported on 08/02/2023 08/02/23   [provider]  valACYclovir (VALTREX) 1000 MG tablet as needed. Patient not taking: Reported on 08/02/2023 11/21/22   [provider]  Vitamin D , Ergocalciferol , (DRISDOL ) 1.25 MG (50000 UNIT) CAPS capsule Take 50,000 Units by mouth every 7 (seven) days. Patient not taking: Reported on 08/02/2023 11/21/22   [provider]    Family History Family History  Problem Relation Age of Onset   Heart attack Father    Prostate cancer Father    Heart failure Father    Diabetes Mother    Clotting disorder Mother        PE   Heart failure Mother    Kidney disease Mother    Breast cancer Sister    Colon cancer Sister    Esophageal cancer Neg Hx    Rectal cancer Neg Hx    Stomach cancer Neg Hx     Social History Social History   Tobacco Use   Smoking status: Former    Current packs/day: 0.00    Average packs/day: 1 pack/day for 36.0 years (36.0 ttl pk-yrs)    Types: Cigarettes    Start date: 22    Quit date: 2017    Years since quitting: 8.0   Smokeless tobacco: Never   Tobacco comments:    Former smoker last cigarette 2017 04/15/22  Vaping Use   Vaping status: Never Used  Substance Use Topics   Alcohol use: No    Alcohol/week: 0.0 standard drinks of alcohol   Drug use: No     Allergies   Sulfa antibiotics   Review of Systems Review of Systems  Constitutional:  Positive for fever (subjective ?).  HENT:  Positive for congestion and sore throat. Negative for ear pain.   Eyes:  Negative for discharge and redness.  Respiratory:  Positive for cough. Negative for shortness of breath and wheezing.   Gastrointestinal:  Negative for abdominal pain, diarrhea, nausea and  vomiting.  Genitourinary:  Positive for dysuria.     Physical Exam Triage Vital Signs ED Triage Vitals  Encounter Vitals Group     BP 08/02/23 0924 117/73     Systolic BP Percentile --      Diastolic BP Percentile --      Pulse Rate 08/02/23 0924 71     Resp 08/02/23 0924 20     Temp 08/02/23 0924 97.8 F (36.6 C)     Temp Source 08/02/23 0924 Oral     SpO2 08/02/23 0924 98 %     Weight --      Height --      Head Circumference --      Peak Flow --      Pain Score 08/02/23 0926 9     Pain Loc --      Pain Education --      Exclude from Growth Chart --    No data found.  Updated Vital Signs BP 117/73 (BP Location: Right Arm)   Pulse 71   Temp 97.8 F (36.6 C) (Oral)   Resp 20   SpO2 98%   Visual Acuity Right Eye Distance:   Left Eye Distance:   Bilateral Distance:    Right Eye Near:   Left Eye Near:    Bilateral Near:     Physical Exam Vitals and nursing note reviewed.  Constitutional:  General: She is not in acute distress.    Appearance: Normal appearance. She is not ill-appearing.  HENT:     Head: Normocephalic and atraumatic.     Nose: Congestion present.     Mouth/Throat:     Mouth: Mucous membranes are moist.     Pharynx: No oropharyngeal exudate or posterior oropharyngeal erythema.  Eyes:     Conjunctiva/sclera: Conjunctivae normal.  Cardiovascular:     Rate and Rhythm: Normal rate and regular rhythm.     Heart sounds: Normal heart sounds. No murmur heard. Pulmonary:     Effort: Pulmonary effort is normal. No respiratory distress.     Breath sounds: Normal breath sounds. No wheezing, rhonchi or rales.  Skin:    General: Skin is warm and dry.  Neurological:     Mental Status: She is alert.  Psychiatric:        Mood and Affect: Mood normal.        Thought Content: Thought content normal.      UC Treatments / Results  Labs (all labs ordered are listed, but only abnormal results are displayed) Labs Reviewed  POCT URINALYSIS DIP  (MANUAL ENTRY) - Abnormal; Notable for the following components:      Result Value   Color, UA orange (*)    Glucose, UA =100 (*)    Nitrite, UA Positive (*)    All other components within normal limits  POCT RAPID STREP A (OFFICE) - Normal  SARS CORONAVIRUS 2 (TAT 6-24 HRS)    EKG   Radiology No results found.  Procedures Procedures (including critical care time)  Medications Ordered in UC Medications - No data to display  Initial Impression / Assessment and Plan / UC Course  I have reviewed the triage vital signs and the nursing notes.  Pertinent labs & imaging results that were available during my care of the patient were reviewed by me and considered in my medical decision making (see chart for details).    Rapid strep test negative in office.  Will order COVID screening.  UA with concerning finding consistent with UTI, will treat with Augmentin  and urine culture ordered.  Encouraged follow-up with any further concerns.  Final Clinical Impressions(s) / UC Diagnoses   Final diagnoses:  Exposure to COVID-19 virus   Discharge Instructions   None    ED Prescriptions   None    PDMP not reviewed this encounter.   Billy Asberry FALCON, PA-C 08/02/23 1116

## 2023-08-03 ENCOUNTER — Telehealth: Payer: Self-pay

## 2023-08-03 ENCOUNTER — Ambulatory Visit
Admission: EM | Admit: 2023-08-03 | Discharge: 2023-08-03 | Disposition: A | Discharge status: Home / Self Care | Payer: 59 | Attending: Physician Assistant | Admitting: Physician Assistant

## 2023-08-03 DIAGNOSIS — N3000 Acute cystitis without hematuria: Secondary | ICD-10-CM

## 2023-08-03 LAB — URINE CULTURE

## 2023-08-03 LAB — SARS CORONAVIRUS 2 (TAT 6-24 HRS): SARS Coronavirus 2: NEGATIVE

## 2023-08-03 NOTE — ED Triage Notes (Signed)
 Urine Culture only (already ordered).

## 2023-08-03 NOTE — Telephone Encounter (Signed)
 Returned call to patient. Reviewed the following:     Component Ref Range & Units (hover) 1 d ago  Specimen Description URINE, CLEAN CATCH  Special Requests NONE Performed at [facility], [address] [place], [place] [address]  Culture MULTIPLE SPECIES PRESENT, SUGGEST RECOLLECTION Abnormal   Report Status 08/03/2023 FINAL  Resulting Agency [facility]        Specimen Collected: 08/02/23 11:20 Last Resulted: 08/03/23 12:22   & Discharge Summary reviewed (see below):  Initial Impression / Assessment and Plan / UC Course  I have reviewed the triage vital signs and the nursing notes.   Pertinent labs & imaging results that were available during my care of the patient were reviewed by me and considered in my medical decision making (see chart for details).   Rapid strep test negative in office.  Will order COVID screening.  UA with concerning finding consistent with UTI, will treat with Augmentin  and urine culture ordered.  Encouraged follow-up with any further concerns.  Patient will drop back by office for repeat Urine Culture collection (only). Previous provider not in location, patient to come back by for Nurse Visit only. Copied to R. Stuart PA (UC provider today) as RICK.  WENDI Dixon CMA

## 2023-08-03 NOTE — ED Triage Notes (Signed)
 See Telephone Call.  Yancey Flemings CMA

## 2023-08-04 ENCOUNTER — Ambulatory Visit
Admission: EM | Admit: 2023-08-04 | Discharge: 2023-08-04 | Disposition: A | Discharge status: Home / Self Care | Payer: 59 | Attending: Family | Admitting: Family

## 2023-08-04 DIAGNOSIS — B370 Candidal stomatitis: Secondary | ICD-10-CM | POA: Diagnosis not present

## 2023-08-04 DIAGNOSIS — K149 Disease of tongue, unspecified: Secondary | ICD-10-CM

## 2023-08-04 DIAGNOSIS — N949 Unspecified condition associated with female genital organs and menstrual cycle: Secondary | ICD-10-CM | POA: Diagnosis not present

## 2023-08-04 DIAGNOSIS — R300 Dysuria: Secondary | ICD-10-CM | POA: Diagnosis not present

## 2023-08-04 DIAGNOSIS — R0789 Other chest pain: Secondary | ICD-10-CM

## 2023-08-04 DIAGNOSIS — R350 Frequency of micturition: Secondary | ICD-10-CM

## 2023-08-04 LAB — URINE CULTURE: Culture: NO GROWTH

## 2023-08-04 MED ORDER — CEFTRIAXONE SODIUM 1 G IJ SOLR
1.0000 g | Freq: Once | INTRAMUSCULAR | Status: AC
  Administered 2023-08-04: 1 g via INTRAMUSCULAR

## 2023-08-04 MED ORDER — NYSTATIN 100000 UNIT/ML MT SUSP: 500000.0000 [IU] | Freq: Four times a day (QID) | OROMUCOSAL | 0 refills | Status: DC

## 2023-08-04 MED ORDER — NITROFURANTOIN MONOHYD MACRO 100 MG PO CAPS: 100.0000 mg | ORAL_CAPSULE | Freq: Two times a day (BID) | ORAL | 0 refills | Status: AC

## 2023-08-04 MED ORDER — FLUCONAZOLE 150 MG PO TABS: 150.0000 mg | ORAL_TABLET | Freq: Once | ORAL | 0 refills | Status: AC

## 2023-08-04 NOTE — Discharge Instructions (Addendum)
 We gave you a shot of Rocephin  (antibiotic) today to help with your urinary infection. Stop Augmentin  for now. Start Macrobid  (Nitrofurantoin ) 100mg  tonight and take twice a day for 5 days. Continue to push water and fluids. Start Diflucan  (antifungal pill) 150mg - take 1 tablet starting tomorrow and repeat 1 tablet on last day of Macrobid . May use Nystatin  mouth wash 5ml every 6 hours for the next 5 days. Continue to monitor symptoms. We will notify you when we get your urine culture results. Please call your Urologist on Monday to schedule an appointment for follow-up. If pain gets worse over the next 24 hours, go to the ER ASAP. Otherwise, follow-up pending urine culture results.

## 2023-08-04 NOTE — ED Provider Notes (Signed)
 [facility]    CSN: [id number] Arrival date & time: 08/04/23  9074      History   Chief Complaint No chief complaint on file.   HPI Felicia Odom is a 68 y.o. female.   68 year old female who presents for continued multiple concerns. She was seen here 2 days ago on 1/8 for sore throat, possible exposure to COVID and urinary discomfort. Her rapid strep test and COVID tests were negative. Her urinalysis was positive for nitrites and glucose and urine culture was sent. She was placed on Augmentin . Her culture came back the next day with result of multiple species- recommend recollection so she returned yesterday to provide another sample for a urine culture which is still pending. Today she returns with concern over worsening of urinary discomfort, vaginal pressure and lower abdominal pain. She also noticed her tongue has been more swollen and white-coated and sore. Her throat is still slightly sore but no difficulty swallowing or breathing. No rash or itching. She also feels like her chest is sore across her breasts bilaterally. She denies any distinct fever, vaginal discharge or itching. She is crying and very uncomfortable due to pain. She just finished a Prednisone  taper (took 9 days instead of 12 due to GI side effects) for cervical radiculopathy in which she was evaluated at the ER on 12/30. Other chronic health issues include HTN, type 2 DM, CHF, A. Fib, arthritis, diverticulosis, hyperlipidemia, lung nodule (not malignant), COPD, GERD, anxiety and depression. Currently on Cardizem , Lasix , Eliquis , Rifaximin , Xanax , Crestor , and Bentyl  and Albuterol  prn,   The history is provided by the patient.    Past Medical History:  Diagnosis Date   Anxiety    Aortic atherosclerosis (HCC) 10/06/2021   Arthritis    neck   Cannabis dependence with current use (HCC) 07/26/2016   Chronic back pain    Chronic bronchitis (HCC)    Chronic diastolic CHF (congestive heart failure)  (HCC)    Chronic neck pain    Cocaine abuse (HCC) 04/05/2021   COPD mixed type (HCC) 02/18/2019   Office Spirometry 10/21/2015-slight restriction of exhaled volume, mild obstruction. FVC 2.69/78%, FEV1 2.04/76%, FEV1/FVC 0.76, FEF 25-75 percent 1.68/65% PFT 04/27/2016-minimal obstructive airways disease, minimal diffusion defect, insignificant response to bronchodilator. FVC 2.79/78%, FEV1 2.11/76%, ratio 0.76, FEF 25-75 percent 1.81/72%, TLC 92%, DLCO 76%.   DDD (degenerative disc disease), cervical 06/15/2015   Diverticulosis    Eczema 11/29/2017   Encounter for long-term opiate analgesic use 07/06/2017   Essential hypertension 09/25/2015   GERD (gastroesophageal reflux disease)    takes Nexium  daily   Helicobacter pylori gastritis 08/2019   Tx and eradicated   Hyperlipidemia    not on any meds bc refused to pick up from pharmacy   Long-term current use of opiate analgesic 04/05/2021   Moderate episode of recurrent major depressive disorder (HCC) 01/28/2020   Obesity (BMI 30.0-34.9) 12/16/2021   Obsessive-compulsive disorder 06/08/2007   Qualifier: Diagnosis of  By: Tanda Milling     PAF (paroxysmal atrial fibrillation) (HCC) 12/16/2021   PANIC DISORDER 06/08/2007   Qualifier: Diagnosis of  By: Tanda Milling     Pneumonia    2016   PONV (postoperative nausea and vomiting)    Primary osteoarthritis involving multiple joints 01/21/2015   Seborrheic dermatitis of scalp 02/11/2015   Seizures (HCC)    in the 80's   Thyroid  nodule 07/30/2020   IMPRESSION: No significant interval change of bilateral thyroid  nodules.   The above  is in keeping with the ACR TI-RADS recommendations - J Am Coll Radiol 2017;14:587-595.     Electronically Signed   By: Aliene Lloyd M.D.   On: 08/17/2020 14:18   Type II diabetes mellitus with manifestations (HCC) 02/18/2019   The 10-year ASCVD risk score Verdon DC Jr., et al., 2013) is: 9.1%   Values used to calculate the score:     Age: 65 years     Sex: Female      Is Non-Hispanic African American: No     Diabetic: Yes     Tobacco smoker: No     Systolic Blood Pressure: 138 mmHg     Is BP treated: No     HDL Cholesterol: 46.2 mg/dL     Total Cholesterol: 180 mg/dL    Patient Active Problem List   Diagnosis Date Noted   Hypokalemia 06/04/2022   Hypercoagulable state due to paroxysmal atrial fibrillation (HCC) 04/15/2022   Mild aortic stenosis 03/10/2022   Obesity (BMI 30.0-34.9) 12/16/2021   PAF (paroxysmal atrial fibrillation) (HCC) 12/16/2021   Chest pain of uncertain etiology 10/06/2021   Aortic atherosclerosis (HCC) 10/06/2021   Arthritis 10/05/2021   Chronic back pain 10/05/2021   Chronic bronchitis (HCC) 10/05/2021   Chronic neck pain 10/05/2021   Diverticulosis 10/05/2021   History of blood transfusion 10/05/2021   Hyperlipidemia 10/05/2021   Joint pain 10/05/2021   Joint swelling 10/05/2021   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   Nocturia 10/05/2021   Pneumonia 10/05/2021   PONV (postoperative nausea and vomiting) 10/05/2021   Seizures (HCC) 10/05/2021   Right calf pain    Anxiety    Chronic diastolic CHF (congestive heart failure) (HCC)    Atrial fibrillation with RVR (HCC) 09/24/2021   Muscle spasm 08/02/2021   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/08/2021   URI (upper respiratory infection) 07/08/2021   Long-term current use of opiate analgesic 04/05/2021   Cocaine abuse (HCC) 04/05/2021   Thyroid  nodule 07/30/2020   Moderate episode of recurrent major depressive disorder (HCC) 01/28/2020   Routine general medical examination at a health care facility 01/28/2020   Helicobacter pylori gastritis 08/2019   COPD mixed type (HCC) 02/18/2019   Type II diabetes mellitus with manifestations (HCC) 02/18/2019   Former tobacco use 10/01/2018   Eczema 11/29/2017   Encounter for long-term opiate analgesic use 07/06/2017   Cannabis dependence with current use (HCC) 07/26/2016   Essential hypertension 09/25/2015   DDD (degenerative disc disease), cervical  06/15/2015   Seborrheic dermatitis of scalp 02/11/2015   Primary osteoarthritis involving multiple joints 01/21/2015   History of colonic polyps 01/21/2015   Hyperlipidemia with target LDL less than 100 01/21/2015   Atypical chest pain 05/08/2013   GERD (gastroesophageal reflux disease) 05/08/2013   Atherosclerosis of aorta (HCC) 05/08/2013   Allergic rhinitis 10/01/2010   PANIC DISORDER 06/08/2007   Obsessive-compulsive disorder 06/08/2007    Past Surgical History:  Procedure Laterality Date   ATRIAL FIBRILLATION ABLATION N/A 09/08/2022   Procedure: ATRIAL FIBRILLATION ABLATION;  Surgeon: Inocencio Soyla Lunger, MD;  Location: [facility];  Service: Cardiovascular;  Laterality: N/A;   COLONOSCOPY     ECTOPIC PREGNANCY SURGERY     x 2    ESOPHAGOGASTRODUODENOSCOPY     LAPAROSCOPIC LYSIS INTESTINAL ADHESIONS     x 2   RESECTION OF MEDIASTINAL MASS N/A 06/10/2016   Procedure: RESECTION OF MEDIASTINAL MASS;  Surgeon: Dallas KATHEE Jude, MD;  Location: [facility] OR;  Service: Thoracic;  Laterality: N/A;   VIDEO ASSISTED THORACOSCOPY Right  06/10/2016   Procedure: VIDEO ASSISTED THORACOSCOPY WITH PLACEMENT OF ON Q TUNNELER;  Surgeon: Dallas KATHEE Jude, MD;  Location: [facility] OR;  Service: Thoracic;  Laterality: Right;   VIDEO BRONCHOSCOPY N/A 06/10/2016   Procedure: VIDEO BRONCHOSCOPY;  Surgeon: Dallas KATHEE Jude, MD;  Location: [facility] OR;  Service: Thoracic;  Laterality: N/A;    OB History     Gravida  5   Para  1   Term  1   Preterm      AB  4   Living  1      SAB  3   IAB      Ectopic      Multiple      Live Births  1            Home Medications    Prior to Admission medications   Medication Sig Start Date End Date Taking? Authorizing Provider  nitrofurantoin , macrocrystal-monohydrate, (MACROBID ) 100 MG capsule Take 1 capsule (100 mg total) by mouth 2 (two) times daily for 5 days. 08/04/23 08/09/23 Yes Evany Schecter, Jenkins Lesches, NP  nystatin  (MYCOSTATIN ) 100000 UNIT/ML  suspension Take 5 mLs (500,000 Units total) by mouth 4 (four) times daily. 08/04/23  Yes Griselda Bramblett, Jenkins Lesches, NP  ACCU-CHEK GUIDE test strip USE 1 TO CHECK BLOOD SUGARS ONCE A WEEK 11/25/22   [provider]  Accu-Chek Softclix Lancets lancets 3 (three) times daily. 07/01/23   [provider]  acetaminophen  (TYLENOL ) 500 MG tablet Take 1,000 mg by mouth every 6 (six) hours as needed for fever or headache.    [provider]  albuterol  (PROVENTIL ) (2.5 MG/3ML) 0.083% nebulizer solution USE 1 VIAL IN NEBULIZER EVERY 6 HOURS - And As Needed 07/14/23   Neysa Rama D, MD  albuterol  (VENTOLIN  HFA) 108 (90 Base) MCG/ACT inhaler INHALE 1 TO 2 PUFFS BY MOUTH INTO THE LUNGS EVERY 4 HOURS AS NEEDED FOR WHEEZING FOR SHORTNESS OF BREATH 08/02/23   Neysa Rama D, MD  ALPRAZolam  (XANAX ) 1 MG tablet Take 1 tablet (1 mg total) by mouth every 6 (six) hours as needed. for anxiety 07/12/23   Neysa Rama D, MD  apixaban  (ELIQUIS ) 5 MG TABS tablet Take 1 tablet by mouth twice daily 07/25/23   Lavona Agent, MD  cholestyramine  (QUESTRAN ) 4 g packet Take 1 packet (4 g total) by mouth daily with lunch. Patient not taking: Reported on 08/02/2023 06/28/23   Avram Lupita BRAVO, MD  dicyclomine  (BENTYL ) 20 MG tablet Take 1 tablet (20 mg total) by mouth every 6 (six) hours as needed for spasms (abdominal pain). 07/03/23   Avram Lupita BRAVO, MD  diltiazem  (CARDIZEM  CD) 120 MG 24 hr capsule TAKE 1 CAPSULE BY MOUTH EVERY DAY 07/11/23   Lavona Agent, MD  diltiazem  (CARDIZEM ) 30 MG tablet TAKE 1 TABLET (30 MG TOTAL) BY MOUTH EVERY 4 (FOUR) HOURS AS NEEDED (ELEVATED HEART RATE). 04/14/23   Lavona Agent, MD  esomeprazole  (NEXIUM ) 20 MG capsule Take 20 mg by mouth daily as needed (acid reflux). 08/19/22   [provider]  furosemide  (LASIX ) 40 MG tablet Take 1 tablet by mouth twice daily 02/10/23   Lavona Agent, MD  Multiple Vitamin (MULTIVITAMIN) tablet Take 1 tablet by mouth daily.    [provider]  rifaximin  (XIFAXAN ) 550 MG TABS tablet Take 1 tablet (550 mg total) by mouth 3 (three) times daily. 06/30/23   Avram Lupita BRAVO, MD  rosuvastatin  (CRESTOR ) 20 MG tablet Take 20 mg by mouth daily. 06/29/23   [provider]  rosuvastatin  (CRESTOR ) 5 MG tablet Take 1 tablet (5 mg total) by mouth every other day. 12/26/22 06/30/23  Lavona Agent, MD  valACYclovir (VALTREX) 1000 MG tablet as needed. Patient not taking: Reported on 08/02/2023 11/21/22   [provider]  Vitamin D , Ergocalciferol , (DRISDOL ) 1.25 MG (50000 UNIT) CAPS capsule Take 50,000 Units by mouth every 7 (seven) days. Patient not taking: Reported on 08/02/2023 11/21/22   [provider]    Family History Family History  Problem Relation Age of Onset   Heart attack Father    Prostate cancer Father    Heart failure Father    Diabetes Mother    Clotting disorder Mother        PE   Heart failure Mother    Kidney disease Mother    Breast cancer Sister    Colon cancer Sister    Esophageal cancer Neg Hx    Rectal cancer Neg Hx    Stomach cancer Neg Hx     Social History Social History   Tobacco Use   Smoking status: Former    Current packs/day: 0.00    Average packs/day: 1 pack/day for 36.0 years (36.0 ttl pk-yrs)    Types: Cigarettes    Start date: 79    Quit date: 2017    Years since quitting: 8.0   Smokeless tobacco: Never   Tobacco comments:    Former smoker last cigarette 2017 04/15/22  Vaping Use   Vaping status: Never Used  Substance Use Topics   Alcohol use: No    Alcohol/week: 0.0 standard drinks of alcohol   Drug use: No     Allergies   Sulfa antibiotics   Review of Systems Review of Systems  Constitutional:  Positive for appetite change and fatigue. Negative for chills, diaphoresis and fever.  HENT:  Positive for congestion (slight nasal and chest), mouth sores (tongue sore and swollen on sides), postnasal drip and sore throat. Negative for facial  swelling, sinus pressure, sinus pain and trouble swallowing.   Eyes:  Negative for pain and discharge.  Respiratory:  Positive for cough. Negative for chest tightness, shortness of breath and wheezing.   Gastrointestinal:  Positive for abdominal pain and nausea. Negative for vomiting.  Genitourinary:  Positive for decreased urine volume, dysuria, frequency and urgency. Negative for difficulty urinating, flank pain, hematuria, vaginal bleeding and vaginal discharge.  Musculoskeletal:  Positive for arthralgias, back pain, myalgias and neck pain. Negative for neck stiffness.  Skin:  Negative for color change and rash.  Allergic/Immunologic: Negative for food allergies.  Neurological:  Negative for dizziness, tremors, syncope, facial asymmetry, speech difficulty and numbness.  Hematological:  Negative for adenopathy. Bruises/bleeds easily.  Psychiatric/Behavioral:  The patient is nervous/anxious.      Physical Exam Triage Vital Signs ED Triage Vitals  Encounter Vitals Group     BP 08/04/23 0944 97/62     Systolic BP Percentile --      Diastolic BP Percentile --      Pulse Rate 08/04/23 0944 75     Resp 08/04/23 0944 18     Temp 08/04/23 0944 97.9 F (36.6 C)     Temp Source 08/04/23 0944 Oral     SpO2 08/04/23 0944 98 %     Weight 08/04/23 0943 187 lb (84.8 kg)     Height 08/04/23 0943 5' 5 (1.651 m)     Head Circumference --      Peak Flow --      Pain  Score 08/04/23 0942 9     Pain Loc --      Pain Education --      Exclude from Growth Chart --    No data found.  Updated Vital Signs BP 97/62 (BP Location: Left Arm)   Pulse 75   Temp 97.9 F (36.6 C) (Oral)   Resp 18   Ht 5' 5 (1.651 m)   Wt 187 lb (84.8 kg)   SpO2 98%   BMI 31.12 kg/m   Visual Acuity Right Eye Distance:   Left Eye Distance:   Bilateral Distance:    Right Eye Near:   Left Eye Near:    Bilateral Near:     Physical Exam Vitals and nursing note reviewed.  Constitutional:      General: She is  awake. She is not in acute distress.    Appearance: She is well-developed. She is not toxic-appearing or diaphoretic.     Comments: She is sitting on the exam table in no acute distress but appears anxious and crying due to situation and pain.   HENT:     Head: Normocephalic and atraumatic.     Right Ear: Hearing normal.     Left Ear: Hearing normal.     Nose: Rhinorrhea present.     Right Sinus: No maxillary sinus tenderness or frontal sinus tenderness.     Left Sinus: No maxillary sinus tenderness or frontal sinus tenderness.     Mouth/Throat:     Lips: Pink.     Mouth: Mucous membranes are moist. No oral lesions or angioedema.     Tongue: No lesions.     Pharynx: Oropharynx is clear. Uvula midline. No pharyngeal swelling, oropharyngeal exudate, posterior oropharyngeal erythema, uvula swelling or postnasal drip.     Comments: Tongue slightly swollen on lateral edges bilaterally. Slight white coating present on anterior aspect of tongue with slight redness underneath. No bleeding. No other lesions present. Airway open and patent.  Eyes:     Extraocular Movements: Extraocular movements intact.     Conjunctiva/sclera: Conjunctivae normal.  Cardiovascular:     Rate and Rhythm: Normal rate and regular rhythm.     Heart sounds: Normal heart sounds. No murmur heard. Pulmonary:     Effort: Pulmonary effort is normal. No tachypnea or respiratory distress.     Breath sounds: Normal breath sounds and air entry. No decreased air movement. No decreased breath sounds, wheezing, rhonchi or rales.  Abdominal:     General: Bowel sounds are normal. There is no distension.     Palpations: Abdomen is soft. There is no mass or pulsatile mass.     Tenderness: There is abdominal tenderness in the right lower quadrant, suprapubic area and left lower quadrant. There is no right CVA tenderness, left CVA tenderness, guarding or rebound.    Genitourinary:    Comments: Patient declined pelvic  exam Musculoskeletal:     Cervical back: Normal range of motion and neck supple.  Lymphadenopathy:     Cervical: No cervical adenopathy.  Skin:    General: Skin is warm and dry.     Capillary Refill: Capillary refill takes less than 2 seconds.     Findings: No rash.  Neurological:     General: No focal deficit present.     Mental Status: She is alert and oriented to person, place, and time.     Cranial Nerves: Cranial nerves 2-12 are intact.     Sensory: Sensation is intact.  Psychiatric:  Attention and Perception: Attention normal.        Mood and Affect: Mood is anxious. Affect is tearful.        Speech: Speech is rapid and pressured.        Behavior: Behavior is cooperative.        Thought Content: Thought content normal.        Cognition and Memory: Cognition normal.      UC Treatments / Results  Labs (all labs ordered are listed, but only abnormal results are displayed) Labs Reviewed - No data to display  EKG   Radiology No results found.  Procedures Procedures (including critical care time)  Medications Ordered in UC Medications  cefTRIAXone  (ROCEPHIN ) injection 1 g (1 g Intramuscular Given 08/04/23 1053)    Initial Impression / Assessment and Plan / UC Course  I have reviewed the triage vital signs and the nursing notes.  Pertinent labs & imaging results that were available during my care of the patient were reviewed by me and considered in my medical decision making (see chart for details).     Did not repeat urinalysis since patient just had urine culture collected yesterday.   Reviewed with patient that the Augmentin  does not appear to be effective against her probable UTI since symptoms are not improving after 48 hours.  Consulted with Rocky Gilford, PA Will give Rocephin  1g IM now to help with probable infection. Stop Augmentin . Switch to Macrobid  100mg  twice a day for 5 days for now- may need to change pending urine culture results. Continue to  push fluids.  Discussed with patient that tongue irritation and swelling are probable due to development of thrush (fungal infection) as a result of oral steroid use for over a week and antibiotic use. May start Nystatin  mouth wash- swish and swallow 4 times a day for at least 5 days. Will also start Diflucan  150mg  to prevent yeast vaginitis due to antibiotic use- take 1 tablet starting tomorrow and repeat 1 tablet at end of Macrobid  use. Uncertain of cause of chest soreness but may be anxiety related due to multiple health concerns and pain. Patient is stable with no emergent need at this time. However, if pain gets worse and any additional tongue swelling or throat swelling, difficulty breathing or chest pain occurs, go to the ER ASAP. Otherwise, follow-up pending urine culture results. Also- Please call your Urologist on Monday (3 days) to schedule appointment for follow-up.   Final Clinical Impressions(s) / UC Diagnoses   Final diagnoses:  Dysuria  Urinary frequency  Vaginal discomfort  Thrush, oral  Tongue irritation  Chest discomfort     Discharge Instructions      We gave you a shot of Rocephin  (antibiotic) today to help with your urinary infection. Stop Augmentin  for now. Start Macrobid  (Nitrofurantoin ) 100mg  tonight and take twice a day for 5 days. Continue to push water and fluids. Start Diflucan  (antifungal pill) 150mg - take 1 tablet starting tomorrow and repeat 1 tablet on last day of Macrobid . May use Nystatin  mouth wash 5ml every 6 hours for the next 5 days. Continue to monitor symptoms. We will notify you when we get your urine culture results. Please call your Urologist on Monday to schedule an appointment for follow-up. If pain gets worse over the next 24 hours, go to the ER ASAP. Otherwise, follow-up pending urine culture results.     ED Prescriptions     Medication Sig Dispense Auth. Provider   nitrofurantoin , macrocrystal-monohydrate, (MACROBID ) 100 MG capsule  Take 1  capsule (100 mg total) by mouth 2 (two) times daily for 5 days. 10 capsule Pearl Jenkins Lesches, NP   fluconazole  (DIFLUCAN ) 150 MG tablet Take 1 tablet (150 mg total) by mouth once for 1 dose. Repeat 1 tablet on last day of antibiotic use. 2 tablet Asmar Brozek Berry, NP   nystatin  (MYCOSTATIN ) 100000 UNIT/ML suspension Take 5 mLs (500,000 Units total) by mouth 4 (four) times daily. 120 mL Pearl Jenkins Lesches, NP      PDMP not reviewed this encounter.   Pearl Jenkins Lesches, NP 08/05/23 [phone]

## 2023-08-04 NOTE — ED Triage Notes (Signed)
 Patient states her chest is burning from taking her first dose of antibiotic, sore throat, tongue is swollen. Patient states she is hurting with vaginal pressure and took 2 Azo.

## 2023-08-07 ENCOUNTER — Telehealth: Payer: Self-pay | Admitting: Cardiology

## 2023-08-07 MED ORDER — DILTIAZEM HCL ER COATED BEADS 120 MG PO CP24: 120.0000 mg | ORAL_CAPSULE | Freq: Every day | ORAL | 3 refills | Status: DC

## 2023-08-07 NOTE — Telephone Encounter (Signed)
*  STAT* If patient is at the pharmacy, call can be transferred to refill team.   1. Which medications need to be refilled? (please list name of each medication and dose if known)   diltiazem  (CARDIZEM  CD) 120 MG 24 hr capsule     2. Would you like to learn more about the convenience, safety, & potential cost savings by using the [facility]? No   3. Are you open to using the [facility] (Type [facility]. ) No   4. Which pharmacy/location (including street and city if local pharmacy) is medication to be sent to?[id number] - [place] ([place]), [place] - [address]    5. Do they need a 30 day or 90 day supply? 90 day  Pt has only 2 tablets and has appt on 1/22

## 2023-08-14 NOTE — Progress Notes (Unsigned)
Cardiology Office Note:   Date:  08/16/2023  ID:  Felicia, Odom 03/02/1956, MRN [id number] PCP: Ellyn Hack, MD  [facility] Providers Cardiologist:  Rollene Rotunda, MD Electrophysiologist:  Will Jorja Loa, MD {  History of Present Illness:   Felicia Odom is a 68 y.o. female who presents for evaluation of atrial fib.    She was in the ED and 03/25/2022 for this.  She was in atrial fib. She converted back to NSR after IV Cardizem drip.  She did not appear to be volume overloaded.  She was started on spironolactone to help with volume and hypokalemia.   Echo on 8/29 demonstrated normal LV function with mild AS.  Of note her event monitor which she wore in August demonstrated paroxysms of A-fib.  She has sinus bradycardia with pauses of 3 seconds as well.  She had nonsustained ventricular tachycardia up to 5 beats.  Her atrial rate with fibrillation averaged at 135.   She saw Dr. Elberta Fortis and now is status post ablation.    Since I saw her her she has had no tachypalpitations.  She had some significant diarrhea that finally resolved.  I do see she was in the emergency room in December with chest discomfort and I reviewed these records for this visit.  This was not thought to be anginal.  She also has had complaints of lower extremity pain.  She is told that this is coming from her "cervical neck."  She describes some soreness with walking or at rest in her popliteal fossa.  She had venous Dopplers in the emergency room and did not have any evidence of clot.  She is not having any new chest pressure, neck or arm discomfort.  She is not having any new shortness of breath, PND or orthopnea.  She has no new edema as long she takes low-dose diuretic.  ROS: As stated in the HPI and negative for all other systems.  Studies Reviewed:    EKG:   EKG Interpretation Date/Time:  Wednesday August 16 2023 14:15:57 EST Ventricular Rate:  84 PR Interval:  128 QRS  Duration:  98 QT Interval:  396 QTC Calculation: 467 R Axis:   -31  Text Interpretation: Normal sinus rhythm Left axis deviation Poor anterior R wave progression When compared with ECG of 24-Jul-2023 08:57, No significant change since last tracing Confirmed by Rollene Rotunda ([id number]) on 08/16/2023 2:36:40 PM    Risk Assessment/Calculations:        Physical Exam:   VS:  BP 118/70 (Cuff Size: Normal)   Pulse 84   Ht 5\' 5"  (1.651 m)   Wt 189 lb (85.7 kg)   SpO2 99%   BMI 31.45 kg/m    Wt Readings from Last 3 Encounters:  08/16/23 189 lb (85.7 kg)  08/04/23 187 lb (84.8 kg)  07/17/23 188 lb 9.6 oz (85.5 kg)     GEN: Well nourished, well developed in no acute distress NECK: No JVD; No carotid bruits CARDIAC: RRR, 2 out of 6 right upper sternal border brief systolic murmur, no diastolic murmurs, rubs, gallops RESPIRATORY:  Clear to auscultation without rales, wheezing or rhonchi  ABDOMEN: Soft, non-tender, non-distended EXTREMITIES:  No edema; No deformity , decreased dorsalis pedis and posttibial's bilateral  ASSESSMENT AND PLAN:   Atrial fib with RVR:   I had a long conversation with the patient and she would prefer to stay on the Eliquis which I think is not unreasonable as I cannot  absolutely exclude bouts of atrial fibrillation.  Therefore, no change in therapy.  She understands the risk benefits of this approach.  Aortic atherosclerosis:   She has dyslipidemia.  She was post be taking her Crestor every other day at least and she has not been doing this.  She agrees to start.  Aortic stenosis: This was mild and I will follow this clinically  Leg pain: She has decreased pulses and I will check ABIs.   Follow up with APP in six months    Signed, Rollene Rotunda, MD

## 2023-08-16 ENCOUNTER — Ambulatory Visit: Payer: 59 | Attending: Cardiology | Admitting: Cardiology

## 2023-08-16 ENCOUNTER — Encounter: Payer: Self-pay | Admitting: Cardiology

## 2023-08-16 VITALS — BP 118/70 | HR 84 | Ht 65.0 in | Wt 189.0 lb

## 2023-08-16 DIAGNOSIS — I4891 Unspecified atrial fibrillation: Secondary | ICD-10-CM

## 2023-08-16 DIAGNOSIS — I350 Nonrheumatic aortic (valve) stenosis: Secondary | ICD-10-CM

## 2023-08-16 DIAGNOSIS — I700 Atherosclerosis of aorta: Secondary | ICD-10-CM

## 2023-08-16 DIAGNOSIS — M79605 Pain in left leg: Secondary | ICD-10-CM

## 2023-08-16 DIAGNOSIS — M79604 Pain in right leg: Secondary | ICD-10-CM

## 2023-08-16 NOTE — Patient Instructions (Signed)
Medication Instructions:  No changes. *If you need a refill on your cardiac medications before your next appointment, please call your pharmacy*   Testing/Procedures: Your physician has requested that you have an ankle brachial index (ABI). During this test an ultrasound and blood pressure cuff are used to evaluate the arteries that supply the arms and legs with blood. Allow thirty minutes for this exam. There are no restrictions or special instructions.  Please note: We ask at that you not bring children with you during ultrasound (echo/ vascular) testing. Due to room size and safety concerns, children are not allowed in the ultrasound rooms during exams. Our front office staff cannot provide observation of children in our lobby area while testing is being conducted. An adult accompanying a patient to their appointment will only be allowed in the ultrasound room at the discretion of the ultrasound technician under special circumstances. We apologize for any inconvenience.    Follow-Up: At [facility], you and your health needs are our priority.  As part of our continuing mission to provide you with exceptional heart care, we have created designated Provider Care Teams.  These Care Teams include your primary Cardiologist (physician) and Advanced Practice Providers (APPs -  Physician Assistants and Nurse Practitioners) who all work together to provide you with the care you need, when you need it.  We recommend signing up for the patient portal called "MyChart".  Sign up information is provided on this After Visit Summary.  MyChart is used to connect with patients for Virtual Visits (Telemedicine).  Patients are able to view lab/test results, encounter notes, upcoming appointments, etc.  Non-urgent messages can be sent to your provider as well.   To learn more about what you can do with MyChart, go to [email or web address].    Your next appointment:   6 month(s)  Provider:   Marjie Skiff, PA-C

## 2023-08-17 ENCOUNTER — Other Ambulatory Visit: Payer: Self-pay | Admitting: Cardiology

## 2023-08-17 MED ORDER — ROSUVASTATIN CALCIUM 5 MG PO TABS: 5.0000 mg | ORAL_TABLET | ORAL | 3 refills | Status: DC

## 2023-09-04 ENCOUNTER — Ambulatory Visit (HOSPITAL_BASED_OUTPATIENT_CLINIC_OR_DEPARTMENT_OTHER)
Admission: RE | Admit: 2023-09-04 | Discharge: 2023-09-04 | Disposition: A | Discharge status: Home / Self Care | Payer: 59 | Source: Ambulatory Visit | Attending: Family Medicine | Admitting: Family Medicine

## 2023-09-04 DIAGNOSIS — Z87891 Personal history of nicotine dependence: Secondary | ICD-10-CM | POA: Diagnosis present

## 2023-09-04 DIAGNOSIS — Z122 Encounter for screening for malignant neoplasm of respiratory organs: Secondary | ICD-10-CM | POA: Insufficient documentation

## 2023-09-10 ENCOUNTER — Other Ambulatory Visit: Payer: Self-pay | Admitting: Internal Medicine

## 2023-09-11 ENCOUNTER — Telehealth: Payer: Self-pay | Admitting: Acute Care

## 2023-09-11 NOTE — Telephone Encounter (Signed)
Patient would like results of CT scan. Patient phone number is [phone].

## 2023-09-11 NOTE — Telephone Encounter (Signed)
 Alprazolam refilled.

## 2023-09-11 NOTE — Telephone Encounter (Signed)
Returned call. Advised results are still pending. Advised office will call once received. Pt verbalized understanding.

## 2023-09-11 NOTE — Telephone Encounter (Signed)
F/u    1. Which medications need to be refilled? (please list name of each medication and dose if known) ALPRAZolam (XANAX) 1 MG tablet   2. Which pharmacy/location (including street and city if local pharmacy) is medication to be sent to? [facility] [address]  3. Do they need a 30 day or 90 day supply? 30  day supply    [facility]. [facility] not [facility] due to [facility]ill date.  Written as 2 times by 08/21/23 - Date filled  08/09/23   5. patient out of medication

## 2023-09-15 ENCOUNTER — Encounter: Payer: Self-pay | Admitting: Internal Medicine

## 2023-09-15 ENCOUNTER — Ambulatory Visit: Payer: 59 | Admitting: Internal Medicine

## 2023-09-15 VITALS — BP 122/68 | HR 76 | Ht 66.0 in | Wt 190.6 lb

## 2023-09-15 DIAGNOSIS — K580 Irritable bowel syndrome with diarrhea: Secondary | ICD-10-CM | POA: Diagnosis not present

## 2023-09-15 DIAGNOSIS — Z860100 Personal history of colon polyps, unspecified: Secondary | ICD-10-CM | POA: Diagnosis not present

## 2023-09-15 DIAGNOSIS — R159 Full incontinence of feces: Secondary | ICD-10-CM

## 2023-09-15 NOTE — Patient Instructions (Signed)
Follow-up as needed.   _______________________________________________________  If your blood pressure at your visit was 140/90 or greater, please contact your primary care physician to follow up on this.  _______________________________________________________  If you are age 67 or older, your body mass index should be between 23-30. Your Body mass index is 30.76 kg/m. If this is out of the aforementioned range listed, please consider follow up with your Primary Care Provider.  If you are age 72 or younger, your body mass index should be between 19-25. Your Body mass index is 30.76 kg/m. If this is out of the aformentioned range listed, please consider follow up with your Primary Care Provider.   ________________________________________________________  The [facility] providers would like to encourage you to use [facility] to communicate with providers for non-urgent requests or questions.  Due to long hold times on the telephone, sending your provider a message by [facility] may be a faster and more efficient way to get a response.  Please allow 48 business hours for a response.  Please remember that this is for non-urgent requests.  _______________________________________________________  Thank you for choosing me and [facility].  Dr.Carl Leone Payor

## 2023-09-15 NOTE — Progress Notes (Signed)
Felicia Odom 68 y.o. 1955-11-06 [id number]  Assessment & Plan:   Encounter Diagnoses  Name Primary?   Irritable bowel syndrome with diarrhea - improved after Xifaxan Yes   Incontinence of feces with fecal urgency - resolved    Hx of colonic polyps     Working diagnosis was IBS-D.  Symptoms seem to start after a colonoscopy.  She had Sapovirus found and a GI pathogen panel in December.  Treatment with Xifaxan x 14 days because resolution of all symptoms.  To be honest I am not sure why she had this transient problem with severe diarrhea.  It was a new issue.  Question if bowel prep disrupted her microbiome significantly and Xifaxan reordered it.  Contribution of the sapovirus unclear but assume it impacted this.  That should have been self-limited so I do not think that was the only reason.  Fortunately she is better/resolved.  Follow-up as needed and routine repeat colonoscopy planned for 2027 based upon polyp history.    Subjective:   Chief Complaint: Follow-up of diarrhea, IBS  HPI 68 year old woman thought to have IBS-D, seen in December.  On December 6 he was seen in the office we started cholestyramine and was using Imodium.  Problem started after a colonoscopy in the fall.  She was having fecal incontinence as well.  She actually ended up having an admission to the hospital with the diarrhea she had also had some bleeding, in December and my colleague saw her in consultation.  We had prescribed Xifaxan for her IBS-D and when she was finally able to start that a week into the treatment diarrhea ceased.  She was also found to have sapovirus on a GI pathogen panel.  This is a relative of norovirus.  She is asymptomatic with formed bowel movements and no leakage and very happy at this point.   09/19/2019 colonoscopy Impression:  - One 10 mm polyp in the transverse colon, removed with a cold snare and removed piecemeal using a hot snare. Resected and retrieved. -  Diverticulosis in the sigmoid colon.  - The examined portion of the ileum was normal.  - The examination was otherwise normal on direct and retroflexion views. Path:3. Surgical [P], random sites - COLONIC MUCOSA WITH NO SIGNIFICANT HISTOPATHOLOGIC CHANGES. - NO MICROSCOPIC COLITIS, ACTIVE INFLAMMATION OR CHRONIC CHANGES. 4. Surgical [P], colon, transverse, polyp - SESSILE SERRATED POLYP(S) WITHOUT CYTOLOGIC DYSPLASIA. - NO EVIDENCE OF CARCINOMA. 04/13/2023 colonoscopy Impression:  - Decreased voluntary sphincter tone, small rectocele and exagerated descent found on digital rectal exam. She has fecal incontinence issues and chronic loose stools ( negative colon biopsies 2021)  - One 15 mm polyp in the transverse colon, removed piecemeal using a cold snare. Resected and retrieved.  - Diverticulosis in the sigmoid colon.  - The examination was otherwise normal on direct and retroflexion views. - Personal history of colonic polyps. 10 mm ssp 2021 Path: FINAL DIAGNOSIS       1. Surgical [P], colon, transverse, polyp (1) :       - SESSILE SERRATED POLYP WITHOUT CYTOLOGIC DYSPLASIA, INFLAMED.       - NEGATIVE FOR MALIGNANCY.  Allergies  Allergen Reactions   Sulfa Antibiotics Hives and Swelling   Current Meds  Medication Sig   ACCU-CHEK GUIDE test strip USE 1 TO CHECK BLOOD SUGARS ONCE A WEEK   Accu-Chek Softclix Lancets lancets 3 (three) times daily.   acetaminophen (TYLENOL) 500 MG tablet Take 1,000 mg by mouth every 6 (six) hours as  needed for fever or headache.   albuterol (PROVENTIL) (2.5 MG/3ML) 0.083% nebulizer solution USE 1 VIAL IN NEBULIZER EVERY 6 HOURS - And As Needed   albuterol (VENTOLIN HFA) 108 (90 Base) MCG/ACT inhaler INHALE 1 TO 2 PUFFS BY MOUTH INTO THE LUNGS EVERY 4 HOURS AS NEEDED FOR WHEEZING FOR SHORTNESS OF BREATH   ALPRAZolam (XANAX) 1 MG tablet TAKE 1 TABLET (1 MG TOTAL) BY MOUTH EVERY 6 (SIX) HOURS AS NEEDED. FOR ANXIETY   apixaban (ELIQUIS) 5 MG TABS tablet Take 1  tablet by mouth twice daily   esomeprazole (NEXIUM) 20 MG capsule Take 20 mg by mouth daily as needed (acid reflux).   furosemide (LASIX) 40 MG tablet Take 1 tablet by mouth twice daily   Multiple Vitamin (MULTIVITAMIN) tablet Take 1 tablet by mouth daily.   valACYclovir (VALTREX) 1000 MG tablet as needed.   Vitamin D, Ergocalciferol, (DRISDOL) 1.25 MG (50000 UNIT) CAPS capsule Take 50,000 Units by mouth every 7 (seven) days.   Past Medical History:  Diagnosis Date   Anxiety    Aortic atherosclerosis (HCC) 10/06/2021   Arthritis    neck   Cannabis dependence with current use (HCC) 07/26/2016   Chronic back pain    Chronic bronchitis (HCC)    Chronic diastolic CHF (congestive heart failure) (HCC)    Chronic neck pain    Cocaine abuse (HCC) 04/05/2021   COPD mixed type (HCC) 02/18/2019   Office Spirometry 10/21/2015-slight restriction of exhaled volume, mild obstruction. FVC 2.69/78%, FEV1 2.04/76%, FEV1/FVC 0.76, FEF 25-75 percent 1.68/65% PFT 04/27/2016-minimal obstructive airways disease, minimal diffusion defect, insignificant response to bronchodilator. FVC 2.79/78%, FEV1 2.11/76%, ratio 0.76, FEF 25-75 percent 1.81/72%, TLC 92%, DLCO 76%.   DDD (degenerative disc disease), cervical 06/15/2015   Diverticulosis    Eczema 11/29/2017   Encounter for long-term opiate analgesic use 07/06/2017   Essential hypertension 09/25/2015   GERD (gastroesophageal reflux disease)    takes Nexium daily   Helicobacter pylori gastritis 08/2019   Tx and eradicated   Hyperlipidemia    not on any meds bc refused to pick up from pharmacy   Long-term current use of opiate analgesic 04/05/2021   Moderate episode of recurrent major depressive disorder (HCC) 01/28/2020   Obesity (BMI 30.0-34.9) 12/16/2021   Obsessive-compulsive disorder 06/08/2007   Qualifier: Diagnosis of  By: Jerolyn Shin     PAF (paroxysmal atrial fibrillation) (HCC) 12/16/2021   PANIC DISORDER 06/08/2007   Qualifier: Diagnosis of   By: Jerolyn Shin     Pneumonia    2016   PONV (postoperative nausea and vomiting)    Primary osteoarthritis involving multiple joints 01/21/2015   Seborrheic dermatitis of scalp 02/11/2015   Seizures (HCC)    in the 80's   Thyroid nodule 07/30/2020   IMPRESSION: No significant interval change of bilateral thyroid nodules.   The above is in keeping with the ACR TI-RADS recommendations - J Am Coll Radiol 2017;14:587-595.     Electronically Signed   By: Acquanetta Belling M.D.   On: 08/17/2020 14:18   Type II diabetes mellitus with manifestations (HCC) 02/18/2019   The 10-year ASCVD risk score Denman George DC Jr., et al., 2013) is: 9.1%   Values used to calculate the score:     Age: 50 years     Sex: Female     Is Non-Hispanic African American: No     Diabetic: Yes     Tobacco smoker: No     Systolic Blood Pressure: 138 mmHg  Is BP treated: No     HDL Cholesterol: 46.2 mg/dL     Total Cholesterol: 180 mg/dL   Past Surgical History:  Procedure Laterality Date   ATRIAL FIBRILLATION ABLATION N/A 09/08/2022   Procedure: ATRIAL FIBRILLATION ABLATION;  Surgeon: Regan Lemming, MD;  Location: [facility];  Service: Cardiovascular;  Laterality: N/A;   COLONOSCOPY     ECTOPIC PREGNANCY SURGERY     x 2    ESOPHAGOGASTRODUODENOSCOPY     LAPAROSCOPIC LYSIS INTESTINAL ADHESIONS     x 2   RESECTION OF MEDIASTINAL MASS N/A 06/10/2016   Procedure: RESECTION OF MEDIASTINAL MASS;  Surgeon: Delight Ovens, MD;  Location: [facility] OR;  Service: Thoracic;  Laterality: N/A;   VIDEO ASSISTED THORACOSCOPY Right 06/10/2016   Procedure: VIDEO ASSISTED THORACOSCOPY WITH PLACEMENT OF ON Q TUNNELER;  Surgeon: Delight Ovens, MD;  Location: [facility] OR;  Service: Thoracic;  Laterality: Right;   VIDEO BRONCHOSCOPY N/A 06/10/2016   Procedure: VIDEO BRONCHOSCOPY;  Surgeon: Delight Ovens, MD;  Location: [facility] OR;  Service: Thoracic;  Laterality: N/A;   Social History   Social History Narrative   Retired [employer]    Divorced   1 son   Former smoker   2 coffee/daty   No EtOH, no drugs   family history includes Breast cancer in her sister; Clotting disorder in her mother; Colon cancer in her sister; Diabetes in her mother; Heart attack in her father; Heart failure in her father and mother; Kidney disease in her mother; Prostate cancer in her father.   Review of Systems As per HPI  Objective:   Physical Exam BP 122/68   Pulse 76   Ht 5\' 6"  (1.676 m)   Wt 190 lb 9.6 oz (86.5 kg)   SpO2 95%   BMI 30.76 kg/m

## 2023-09-18 ENCOUNTER — Other Ambulatory Visit: Payer: Self-pay

## 2023-09-18 DIAGNOSIS — Z87891 Personal history of nicotine dependence: Secondary | ICD-10-CM

## 2023-09-18 DIAGNOSIS — Z122 Encounter for screening for malignant neoplasm of respiratory organs: Secondary | ICD-10-CM

## 2023-09-18 NOTE — Telephone Encounter (Signed)
 Patient would like the results of her Lung Cancer CT.  Please call and advise once results are ready [phone]

## 2023-09-18 NOTE — Telephone Encounter (Signed)
 Returned call. Reviewed results. States she stopped her statin recently due to leg pain. Patient is going to call PCP and request a new statin due to CT results.

## 2023-09-18 NOTE — Telephone Encounter (Signed)
 Patient would like someone to call and explain the results she received on MyChart whenever someone gets the chance.

## 2023-09-18 NOTE — Telephone Encounter (Signed)
 Called and spoke with radiology and patient. Advised results are still pending and office will notify her once received.

## 2023-09-20 ENCOUNTER — Ambulatory Visit (HOSPITAL_COMMUNITY)
Admission: RE | Admit: 2023-09-20 | Discharge: 2023-09-20 | Disposition: A | Discharge status: Home / Self Care | Payer: 59 | Source: Ambulatory Visit | Attending: Cardiology | Admitting: Cardiology

## 2023-09-20 DIAGNOSIS — M79605 Pain in left leg: Secondary | ICD-10-CM | POA: Diagnosis present

## 2023-09-20 DIAGNOSIS — M79604 Pain in right leg: Secondary | ICD-10-CM | POA: Insufficient documentation

## 2023-09-24 LAB — VAS US ABI WITH/WO TBI
Left ABI: 1.12
Right ABI: 1.06

## 2023-09-28 ENCOUNTER — Ambulatory Visit: Payer: 59 | Admitting: Cardiology

## 2023-10-07 ENCOUNTER — Other Ambulatory Visit: Payer: Self-pay | Admitting: Internal Medicine

## 2023-10-11 ENCOUNTER — Ambulatory Visit: Payer: 59 | Admitting: Cardiology

## 2023-10-26 ENCOUNTER — Other Ambulatory Visit: Payer: Self-pay | Admitting: Urology

## 2023-10-26 DIAGNOSIS — N281 Cyst of kidney, acquired: Secondary | ICD-10-CM

## 2023-11-12 NOTE — Progress Notes (Signed)
 Patient ID: Felicia Odom, female    DOB: [birth year], 68 y.o.   MRN: [id number]  HPI F former smoker followed for COPD, Allergic rhinitis, complicated by Thymoma/ resected, anxiety Office spirometry- 01/29/15 Normal spirometry. FVC 2.74/81%, FEV1 2.11/79%, FEV1/FVC 0.77, FEF 25-75 percent 1.79 Office Spirometry 10/21/2015-slight restriction of exhaled volume, mild obstruction. FVC 2.69/78%, FEV1 2.04/76%, FEV1/FVC 0.76, FEF 25-75 percent 1.68/65% PFT 04/27/2016-minimal obstructive airways disease, minimal diffusion defect, insignificant response to bronchodilator. FVC 2.79/78%, FEV1 2.11/76%, ratio 0.76, FEF 25-75 percent 1.81/72%, TLC 92%, DLCO 76%. .----------------------------------------------------------------------------------------------------   03/21/23-67 year old female former smoker (36 pk yrs) followed for Asthma/Bronchitis/ COPD, allergic Rhinitis, complicated by Resection Thymoma/ TSGY, OCD/panic, Anxiety/ Depression, GERD, DM2, HTN, Eczema, Osteoarthritis, Hyperlipidemia, AFib/ Eliquis , dCHF, Aortic Atherosclerosis, -Neb albuterol  , Ventolin  hfa, Xanax  1 mg, Complains of cough with white/yellow mucus, no fever, feels weak "no energy" times a couple of days.  Chest was "popping and crackling" so she thought she ought to come and have me listen to it.  She has used her rescue inhaler and nebulizer. Newly diagnosed diabetes.  Has dieted off some weight.  Was on Ozempic but quit because of constipation.  Had successful cardiac ablation but continues Eliquis  for now.  07/17/23- 68 year old female former smoker (36 pk yrs) followed for Asthma/Bronchitis/ COPD, allergic Rhinitis, complicated by Resection Thymoma/ TSGY, OCD/panic, Anxiety/ Depression, GERD, DM2, HTN, Eczema, Osteoarthritis, Hyperlipidemia, AFib/ Eliquis , dCHF, Aortic Atherosclerosis, -Neb albuterol  , Ventolin  hfa, Xanax  1 mg, CXR 07/06/23- IMPRESSION: No active cardiopulmonary disease. Discussed the use of AI  scribe software for clinical note transcription with the patient, who gave verbal consent to proceed. History of Present Illness   The patient, with a history of IBS, presents with severe diarrhea described as 'muddy water.' She has been to the hospital twice for this issue, but was not admitted. She was prescribed Xifaxan , an antibiotic, which had to be approved by the care team. After starting the antibiotic, she had a 'plug of mucus' which she coughed up. She has four more days left of the antibiotic. She also reports a sore scar from a previous thymoma surgery in 2017. She has been noticing skin tags and spots on her skin, which she is concerned about. She also mentions that she has been having issues with her knee, but does not elaborate on this.    Assessment and Plan:    Diarrhea Severe diarrhea, possibly related to IBS. Recent improvement with Xifaxan . -Continue Xifaxan  as prescribed.  Chronic Lung Disease Stable with no recent flare-ups. Recent mucus plug expectorated. -Continue Albuterol  inhaler and nebulizer as needed.  Skin Lesions Multiple skin tags and senile keratoses. No signs of malignancy. -No intervention needed at this time.  Atrial Fibrillation Stable on medication. No recent episodes. -Continue current medication regimen.  Breast Concerns Soreness at old thymoma scar. No palpable lumps or bumps. -No intervention needed at this time  11/13/23-68 yoF former smoker (36 pk yrs) followed for Asthma/Bronchitis/ COPD, allergic Rhinitis, complicated by Resection Thymoma/ TSGY, OCD/panic, Anxiety/ Depression, GERD, DM2, HTN, Eczema, Osteoarthritis, Hyperlipidemia, AFib/ Eliquis , dCHF, Aortic Atherosclerosis, -Neb albuterol  , Ventolin  hfa, Xanax  1 mg, Pending CT low dose screen- Discussed the use of AI scribe software for clinical note transcription with the patient, who gave verbal consent to proceed.  History of Present Illness   The patient presents for a routine  check-up and medication refill. She reports significant improvement since her cardiac ablation, with fewer visits to the doctor. She also mentions a previous diagnosis of  S A P S, a gastrointestinal condition, which had been initially misdiagnosed as IBS. The patient reports that her symptoms had subsided for a while but are now returning, prompting her to seek further consultation with her gastroenterologist.  The patient's respiratory health appears to be stable, with the use of a nebulizer machine with albuterol  and a Ventolin  rescue inhaler only required occasionally. She notes that her breathing is typically more challenged in the winter. Low dose screening CT f/u  lung nodule is scheduled.  The patient also takes Xanax  for anxiety, which she reports as helpful. She takes it three to four times a day, but usually not as frequently as prescribed. She attributes her improved mental health to the resolution of her heart issues and her personal faith.  The patient also mentions a recent weight gain, which she attributes to her health issues. She expresses a commitment to working on her health.     Assessment and Plan:    Pulmonary health maintenance Breathing improved post-heart ablation. Uses nebulizer with albuterol  and Ventolin  as needed. Home oxygen saturation stable at 97-98%. - Continue nebulizer with albuterol  and Ventolin  rescue inhaler as needed. - Schedule follow-up in 3 months for pulmonary assessment.  Irritable Bowel Syndrome (IBS) IBS diagnosed by Dr. Willy Harvest. Improvement noted post-heart ablation. Further evaluation needed. - Follow up with Dr. Willy Harvest for gastrointestinal evaluation and management.  Anxiety Xanax  effective for anxiety, taken less than prescribed. Mental state improved post-heart issues. Spiritual practices beneficial. - Continue Xanax  for anxiety management.    History of Present Illness  CT chest low dose screen-09/04/23 IMPRESSION: Lung-RADS 1, negative.  Continue annual screening with low-dose chest CT without contrast in 12 months. Aortic Atherosclerosis (ICD10-I70.0) and Emphysema (ICD10-J43.9). IMPRESSION: Lung-RADS 1, negative. Continue annual screening with low-dose chest CT without contrast in 12 months. Aortic Atherosclerosis (ICD10-I70.0) and Emphysema (ICD10-J43.9).  Review of Systems- see HPI + = positive Constitutional:  + weight loss, +night sweats, fevers, chills, fatigue, lassitude. HEENT:   No-  headaches, difficulty swallowing, tooth/dental problems, sore throat,       No-  sneezing, itching, ear ache, nasal congestion, post nasal drip,  CV:   chest pain, No-orthopnea, PND, swelling in lower extremities, anasarca, dizziness, palpitations Resp: + shortness of breath with exertion or at rest.              productive cough,   non-productive cough,  No- coughing up of blood.            change in color of mucus.  No- wheezing.   Skin: No-   rash or lesions. GI:    heartburn, indigestion, No-abdominal pain, nausea, vomiting,  GU:  MS:  + joint pain or swelling.  . Neuro-     nothing unusual Psych: change in mood or affect. +Chronic depression , + anxiety.  No memory loss.   Objective:   Physical Exam General- Alert, Oriented , Distress- none acute, + Obese.  Skin- rash-none, lesions- none, excoriation- none Lymphadenopathy- none Head- + healed scar forehead            Eyes- Gross vision intact, PERRLA, conjunctivae clear secretions            Ears- Hearing, canals-normal            Nose- No- rhinorrhea, no-Septal dev, polyps, erosion, perforation             Throat- Mallampati II , mucosa clear , drainage- none, tonsils- atrophic. +Missing teeth Neck- flexible , trachea  midline, no stridor , thyroid  nl, carotid no bruit Chest - symmetrical excursion , unlabored           Heart/CV- RRR ,  murmur+1S , no gallop, no rub, nl s1 s2                           - JVD- none , edema- none, stasis changes- none, varices- none            Lung- wheeze-none,  unlabored, cough-none, dullness-none, rub- none,            Chest wall-  +R VATS scars s/p thymectomy. No rub. Abd-  Br/ Gen/ Rectal- Not done, not indicated Extrem-  Neuro- grossly intact to observation

## 2023-11-13 ENCOUNTER — Ambulatory Visit (INDEPENDENT_AMBULATORY_CARE_PROVIDER_SITE_OTHER): Payer: 59 | Admitting: Internal Medicine

## 2023-11-13 ENCOUNTER — Encounter: Payer: Self-pay | Admitting: Internal Medicine

## 2023-11-13 VITALS — BP 105/69 | HR 59 | Temp 97.6°F | Resp 18 | Ht 66.0 in | Wt 196.6 lb

## 2023-11-13 DIAGNOSIS — J45909 Unspecified asthma, uncomplicated: Secondary | ICD-10-CM | POA: Diagnosis not present

## 2023-11-13 DIAGNOSIS — J449 Chronic obstructive pulmonary disease, unspecified: Secondary | ICD-10-CM | POA: Diagnosis not present

## 2023-11-13 DIAGNOSIS — J4521 Mild intermittent asthma with (acute) exacerbation: Secondary | ICD-10-CM

## 2023-11-13 NOTE — Patient Instructions (Signed)
 Glad you are doing better We anticipate a low dose screening chest CT 12 months after the last one- in February  We can continue current meds. Call for refills when needed.

## 2023-11-19 NOTE — Progress Notes (Unsigned)
  Electrophysiology Office Note:   Date:  11/20/2023  ID:  Falak, Fritsch 1955-10-09, MRN [id number]  Primary Cardiologist: Eilleen Grates, MD Primary Heart Failure: None Electrophysiologist: Brevyn Ring [place] Ding, MD      History of Present Illness:   Felicia Odom is a 68 y.o. female with h/o aortic valve sclerosis, hypokalemia, hypertension, hyperlipidemia, COPD, atrial fibrillation seen today for routine electrophysiology followup.   Since last being seen in our clinic the patient reports doing well.  She has noted no further episodes of atrial fibrillation.  She continues to do her daily activities.  She states that he has gained a little bit of weight over the last few months, but otherwise has no acute complaints.  she denies chest pain, palpitations, dyspnea, PND, orthopnea, nausea, vomiting, dizziness, syncope, edema, weight gain, or early satiety.   Review of systems complete and found to be negative unless listed in HPI.   EP Information / Studies Reviewed:    EKG is not ordered today. EKG from 08/16/23 reviewed which showed sinus rhythm        Risk Assessment/Calculations:    CHA2DS2-VASc Score = 6  Device this indicates a 9.7% annual risk of stroke. The patient's score is based upon: CHF History: 1 HTN History: 1 Diabetes History: 1 Stroke History: 0 Vascular Disease History: 1 Age Score: 1 Gender Score: 1             Physical Exam:   VS:  BP 114/68 (BP Location: Right Arm, Patient Position: Sitting, Cuff Size: Large)   Pulse (!) 54   Ht 5\' 6"  (1.676 m)   Wt 195 lb (88.5 kg)   SpO2 98%   BMI 31.47 kg/m    Wt Readings from Last 3 Encounters:  11/20/23 195 lb (88.5 kg)  11/13/23 196 lb 9.6 oz (89.2 kg)  09/15/23 190 lb 9.6 oz (86.5 kg)     GEN: Well nourished, well developed in no acute distress NECK: No JVD; No carotid bruits CARDIAC: Regular rate and rhythm, no murmurs, rubs, gallops RESPIRATORY:  Clear to auscultation without rales,  wheezing or rhonchi  ABDOMEN: Soft, non-tender, non-distended EXTREMITIES:  No edema; No deformity   ASSESSMENT AND PLAN:    1.  Paroxysmal atrial fibrillation: Post ablation 09/08/2022.  She remains in sinus rhythm.  She has had no recurrences of her atrial fibrillation.  She is happy with her control.  2.  Secondary hypercoagulable state: On Eliquis  for atrial fibrillation  3.  Hypertension: Well-controlled  4.  Obesity: Lifestyle modification encouraged  She is doing well at her next appointment, can follow-up with EP as needed.  Follow up with EP APP in 12 months  Signed, Paublo Warshawsky Cortland Ding, MD

## 2023-11-20 ENCOUNTER — Ambulatory Visit: Payer: 59 | Attending: Cardiology | Admitting: Cardiology

## 2023-11-20 ENCOUNTER — Encounter: Payer: Self-pay | Admitting: Cardiology

## 2023-11-20 VITALS — BP 114/68 | HR 54 | Ht 66.0 in | Wt 195.0 lb

## 2023-11-20 DIAGNOSIS — D6869 Other thrombophilia: Secondary | ICD-10-CM | POA: Diagnosis not present

## 2023-11-20 DIAGNOSIS — I480 Paroxysmal atrial fibrillation: Secondary | ICD-10-CM

## 2023-11-20 DIAGNOSIS — I10 Essential (primary) hypertension: Secondary | ICD-10-CM | POA: Diagnosis not present

## 2023-11-21 ENCOUNTER — Telehealth: Payer: Self-pay | Admitting: Cardiology

## 2023-11-21 NOTE — Telephone Encounter (Signed)
 Patient wants to know if she should be on diltiazem  or not. She stated she is afraid to be off of the diltiazem , but she is willing to come off it. She asked is it safe to stop. Will forward to Dr. Lawana Pray and his nurse.

## 2023-11-21 NOTE — Telephone Encounter (Signed)
 Patient calling in to see what medication that the dr want her to stop taking. Patient would like for it to be Diltiazem . Please advise

## 2023-11-21 NOTE — Telephone Encounter (Signed)
 Pt advised ok to stop Diltiazem  and see how she does.  Patient agreeable to plan and agrees to let us  know if need to restart it.

## 2023-11-21 NOTE — Telephone Encounter (Signed)
 Pt said she is willing to try off of the Diltiazem  if Dr. Lawana Pray agreeable.  Aware will await MD response and let her know.

## 2023-11-22 ENCOUNTER — Encounter: Payer: Self-pay | Admitting: Internal Medicine

## 2023-11-29 ENCOUNTER — Other Ambulatory Visit

## 2023-12-12 ENCOUNTER — Telehealth: Payer: Self-pay | Admitting: Internal Medicine

## 2023-12-12 ENCOUNTER — Other Ambulatory Visit: Payer: Self-pay

## 2023-12-12 DIAGNOSIS — R197 Diarrhea, unspecified: Secondary | ICD-10-CM

## 2023-12-12 MED ORDER — DICYCLOMINE HCL 20 MG PO TABS: 20.0000 mg | ORAL_TABLET | Freq: Four times a day (QID) | ORAL | 1 refills | Status: DC | PRN

## 2023-12-12 NOTE — Telephone Encounter (Signed)
 Requesting to speak with a nurse in regards to having diarrhea for the past few weeks.   Please advise.

## 2023-12-12 NOTE — Telephone Encounter (Signed)
 Spoke with patient. Patient was tearful during conversation, states, "the brown water is coming out of my butt again." Reports this is ongoing x3 weeks. Denies odor. States right above her "pubic hair but below the belly button" she is having severe pain. She reports that she has had the pain pretty severe, but today is the worst she has felt. "It feels like someone is in there stabbing me." Patient states she has taken Imodium but it has not helped. Took Tylenol  x2 this am, however "this ain't touched the pain." Asked patient if she still had Dicyclomine , as she had RX for this given 07/01/2023 with a 90 count given. Patient attempted to find medication while on the phone with the nurse, but was unable to locate. Recommended patient go to ER due to severe pain, patient became even more tearful states that last time she went to [facility], it was not a good experience, and "I've got someone coming to fix a leak in my kitchen sink today." Discussed the possibility of going to [facility] ER instead. Patient again became increasingly tearful and asked us  to help her if possible. Requested appointment for today, advised patient that we have no appointments available. She is asking for a refill of the Dicyclomine  to see if that will help.

## 2023-12-12 NOTE — Telephone Encounter (Signed)
 Medication refilled. Stool tests ordered. Called patient, discussed provider recommendations with patient. Patient remains tearful, states the pain has eased up some. Discussed coming into lab to get stool studies done. Patient remains tearful, states "I will try to have my son bring me, I don't think I should drive." This RN agreed that having son drive may be best. Patient agrees with plan.

## 2023-12-13 ENCOUNTER — Other Ambulatory Visit: Payer: Self-pay

## 2023-12-13 ENCOUNTER — Emergency Department (HOSPITAL_COMMUNITY)

## 2023-12-13 ENCOUNTER — Observation Stay (HOSPITAL_COMMUNITY)
Admission: EM | Admit: 2023-12-13 | Discharge: 2023-12-15 | Disposition: A | Discharge status: Home / Self Care | Attending: Internal Medicine | Admitting: Internal Medicine

## 2023-12-13 ENCOUNTER — Other Ambulatory Visit

## 2023-12-13 ENCOUNTER — Encounter (HOSPITAL_COMMUNITY): Payer: Self-pay

## 2023-12-13 DIAGNOSIS — R1030 Lower abdominal pain, unspecified: Secondary | ICD-10-CM

## 2023-12-13 DIAGNOSIS — Z87891 Personal history of nicotine dependence: Secondary | ICD-10-CM | POA: Diagnosis not present

## 2023-12-13 DIAGNOSIS — Z7901 Long term (current) use of anticoagulants: Secondary | ICD-10-CM | POA: Insufficient documentation

## 2023-12-13 DIAGNOSIS — E876 Hypokalemia: Secondary | ICD-10-CM | POA: Diagnosis not present

## 2023-12-13 DIAGNOSIS — R109 Unspecified abdominal pain: Secondary | ICD-10-CM | POA: Diagnosis not present

## 2023-12-13 DIAGNOSIS — E119 Type 2 diabetes mellitus without complications: Secondary | ICD-10-CM | POA: Diagnosis not present

## 2023-12-13 DIAGNOSIS — R197 Diarrhea, unspecified: Principal | ICD-10-CM

## 2023-12-13 DIAGNOSIS — I110 Hypertensive heart disease with heart failure: Secondary | ICD-10-CM | POA: Insufficient documentation

## 2023-12-13 DIAGNOSIS — A0839 Other viral enteritis: Secondary | ICD-10-CM | POA: Insufficient documentation

## 2023-12-13 DIAGNOSIS — I5032 Chronic diastolic (congestive) heart failure: Secondary | ICD-10-CM | POA: Insufficient documentation

## 2023-12-13 DIAGNOSIS — F419 Anxiety disorder, unspecified: Secondary | ICD-10-CM | POA: Insufficient documentation

## 2023-12-13 DIAGNOSIS — Z79899 Other long term (current) drug therapy: Secondary | ICD-10-CM | POA: Diagnosis not present

## 2023-12-13 DIAGNOSIS — E785 Hyperlipidemia, unspecified: Secondary | ICD-10-CM | POA: Insufficient documentation

## 2023-12-13 DIAGNOSIS — I10 Essential (primary) hypertension: Secondary | ICD-10-CM | POA: Diagnosis present

## 2023-12-13 DIAGNOSIS — J4489 Other specified chronic obstructive pulmonary disease: Secondary | ICD-10-CM | POA: Insufficient documentation

## 2023-12-13 DIAGNOSIS — I480 Paroxysmal atrial fibrillation: Secondary | ICD-10-CM | POA: Diagnosis present

## 2023-12-13 DIAGNOSIS — J449 Chronic obstructive pulmonary disease, unspecified: Secondary | ICD-10-CM | POA: Diagnosis present

## 2023-12-13 LAB — CBC WITH DIFFERENTIAL/PLATELET
Abs Immature Granulocytes: 0.03 10*3/uL (ref 0.00–0.07)
Basophils Absolute: 0.1 10*3/uL (ref 0.0–0.1)
Basophils Relative: 1 %
Eosinophils Absolute: 0.1 10*3/uL (ref 0.0–0.5)
Eosinophils Relative: 1 %
HCT: 46.5 % — ABNORMAL HIGH (ref 36.0–46.0)
Hemoglobin: 15.6 g/dL — ABNORMAL HIGH (ref 12.0–15.0)
Immature Granulocytes: 0 %
Lymphocytes Relative: 33 %
Lymphs Abs: 3 10*3/uL (ref 0.7–4.0)
MCH: 28.7 pg (ref 26.0–34.0)
MCHC: 33.5 g/dL (ref 30.0–36.0)
MCV: 85.6 fL (ref 80.0–100.0)
Monocytes Absolute: 0.9 10*3/uL (ref 0.1–1.0)
Monocytes Relative: 10 %
Neutro Abs: 4.9 10*3/uL (ref 1.7–7.7)
Neutrophils Relative %: 55 %
Platelets: 332 10*3/uL (ref 150–400)
RBC: 5.43 MIL/uL — ABNORMAL HIGH (ref 3.87–5.11)
RDW: 12.9 % (ref 11.5–15.5)
WBC: 8.9 10*3/uL (ref 4.0–10.5)
nRBC: 0 % (ref 0.0–0.2)

## 2023-12-13 LAB — MAGNESIUM: Magnesium: 2 mg/dL (ref 1.7–2.4)

## 2023-12-13 LAB — URINALYSIS, ROUTINE W REFLEX MICROSCOPIC
Bilirubin Urine: NEGATIVE
Glucose, UA: NEGATIVE mg/dL
Hgb urine dipstick: NEGATIVE
Ketones, ur: NEGATIVE mg/dL
Leukocytes,Ua: NEGATIVE
Nitrite: NEGATIVE
Protein, ur: NEGATIVE mg/dL
Specific Gravity, Urine: 1.008 (ref 1.005–1.030)
pH: 6 (ref 5.0–8.0)

## 2023-12-13 LAB — COMPREHENSIVE METABOLIC PANEL WITH GFR
ALT: 22 U/L (ref 0–44)
AST: 25 U/L (ref 15–41)
Albumin: 4 g/dL (ref 3.5–5.0)
Alkaline Phosphatase: 77 U/L (ref 38–126)
Anion gap: 11 (ref 5–15)
BUN: 14 mg/dL (ref 8–23)
CO2: 25 mmol/L (ref 22–32)
Calcium: 9.3 mg/dL (ref 8.9–10.3)
Chloride: 98 mmol/L (ref 98–111)
Creatinine, Ser: 0.67 mg/dL (ref 0.44–1.00)
GFR, Estimated: >60 mL/min (ref >60)
Glucose, Bld: 124 mg/dL — ABNORMAL HIGH (ref 70–99)
Potassium: 2.6 mmol/L — CL (ref 3.5–5.1)
Sodium: 134 mmol/L — ABNORMAL LOW (ref 135–145)
Total Bilirubin: 0.8 mg/dL (ref 0.0–1.2)
Total Protein: 7.7 g/dL (ref 6.5–8.1)

## 2023-12-13 MED ORDER — POTASSIUM CHLORIDE 10 MEQ/100ML IV SOLN
10.0000 meq | INTRAVENOUS | Status: AC
  Administered 2023-12-13 – 2023-12-14 (×3): 10 meq via INTRAVENOUS
  Filled 2023-12-13 (×4): qty 100

## 2023-12-13 MED ORDER — IOHEXOL 300 MG/ML  SOLN
100.0000 mL | Freq: Once | INTRAMUSCULAR | Status: AC | PRN
  Administered 2023-12-13: 100 mL via INTRAVENOUS

## 2023-12-13 MED ORDER — SODIUM CHLORIDE 0.9 % IV BOLUS
1000.0000 mL | Freq: Once | INTRAVENOUS | Status: AC
  Administered 2023-12-13: 1000 mL via INTRAVENOUS

## 2023-12-13 MED ORDER — ONDANSETRON HCL 4 MG/2ML IJ SOLN
4.0000 mg | Freq: Once | INTRAMUSCULAR | Status: AC
  Administered 2023-12-13: 4 mg via INTRAVENOUS
  Filled 2023-12-13: qty 2

## 2023-12-13 MED ORDER — FENTANYL CITRATE PF 50 MCG/ML IJ SOSY
50.0000 ug | PREFILLED_SYRINGE | Freq: Once | INTRAMUSCULAR | Status: AC
  Administered 2023-12-13: 50 ug via INTRAVENOUS
  Filled 2023-12-13: qty 1

## 2023-12-13 NOTE — H&P (Signed)
 History and Physical    Felicia Odom ZOX:[id number] DOB: 1955-11-10 DOA: 12/13/2023  PCP: Ulysees Gander, MD  Patient coming from: Home  I have personally briefly reviewed patient's old medical records in [facility]  Chief Complaint: Diarrhea  HPI: Felicia Odom is a 68 y.o. female with medical history significant for PAF s/p ablation on Eliquis , COPD, HTN, HLD, IBS-D, anxiety who presented to the ED for evaluation of diarrhea.  Patient has a history of chronic loose stools related to IBS-D.  She says she had a diarrheal illness this past December that was secondary to Sapovirus.  She follows with gastroenterology and has been treated in the past with courses of Xifaxan  with improvement of her diarrhea.  Patient states that she has been having worsening loose stools and diarrhea progressive over the last 3 weeks.  She has been having more frequent watery diarrhea over the last few days and earlier today developed very foul smelling stools.  She says this is similar to her diarrheal illness in December.  She has been feeling generally fatigued and has felt "loopy."  She has had some lightheadedness without loss of consciousness.  She is having generalized abdominal pain and feels like she has a hot sensation in her abdomen.  ED Course  Labs/Imaging on admission: I have personally reviewed following labs and imaging studies.  Initial vitals showed BP 133/75, pulse 80, RR 18, temp 98.1 F, SpO2 100% on room air.  Labs showed sodium 134, potassium 2.6, magnesium  2.0, bicarb 25, BUN 14, creatinine 0.67, serum glucose 124, LFTs within normal limits, WBC 8.9, hemoglobin 15.6, platelets 332.  UA negative for UTI.  C. difficile and GI pathogen panels ordered and pending.  CT abdomen/pelvis with contrast negative for acute abnormality.  Patient was given 1 L normal saline, IV K 10 mEq x 3.  The hospitalist service was consulted to admit.  Review of Systems: All systems  reviewed and are negative except as documented in history of present illness above.   Past Medical History:  Diagnosis Date   Anxiety    Aortic atherosclerosis (HCC) 10/06/2021   Arthritis    neck   Cannabis dependence with current use (HCC) 07/26/2016   Chronic back pain    Chronic bronchitis (HCC)    Chronic diastolic CHF (congestive heart failure) (HCC)    Chronic neck pain    Cocaine abuse (HCC) 04/05/2021   COPD mixed type (HCC) 02/18/2019   Office Spirometry 10/21/2015-slight restriction of exhaled volume, mild obstruction. FVC 2.69/78%, FEV1 2.04/76%, FEV1/FVC 0.76, FEF 25-75 percent 1.68/65% PFT 04/27/2016-minimal obstructive airways disease, minimal diffusion defect, insignificant response to bronchodilator. FVC 2.79/78%, FEV1 2.11/76%, ratio 0.76, FEF 25-75 percent 1.81/72%, TLC 92%, DLCO 76%.   DDD (degenerative disc disease), cervical 06/15/2015   Diverticulosis    Eczema 11/29/2017   Encounter for long-term opiate analgesic use 07/06/2017   Essential hypertension 09/25/2015   GERD (gastroesophageal reflux disease)    takes Nexium  daily   Helicobacter pylori gastritis 08/2019   Tx and eradicated   Hyperlipidemia    not on any meds bc refused to pick up from pharmacy   Long-term current use of opiate analgesic 04/05/2021   Moderate episode of recurrent major depressive disorder (HCC) 01/28/2020   Obesity (BMI 30.0-34.9) 12/16/2021   Obsessive-compulsive disorder 06/08/2007   Qualifier: Diagnosis of  By: Jerilynn Montenegro     PAF (paroxysmal atrial fibrillation) (HCC) 12/16/2021   PANIC DISORDER 06/08/2007   Qualifier: Diagnosis of  By: Jerilynn Montenegro     Pneumonia    2016   PONV (postoperative nausea and vomiting)    Primary osteoarthritis involving multiple joints 01/21/2015   Seborrheic dermatitis of scalp 02/11/2015   Seizures (HCC)    in the 80's   Thyroid  nodule 07/30/2020   IMPRESSION: No significant interval change of bilateral thyroid  nodules.   The above is  in keeping with the ACR TI-RADS recommendations - J Am Coll Radiol 2017;14:587-595.     Electronically Signed   By: Elester Grim M.D.   On: 08/17/2020 14:18   Type II diabetes mellitus with manifestations (HCC) 02/18/2019   The 10-year ASCVD risk score Risa Cheney DC Marieta Shorten., et al., 2013) is: 9.1%   Values used to calculate the score:     Age: 36 years     Sex: Female     Is Non-Hispanic African American: No     Diabetic: Yes     Tobacco smoker: No     Systolic Blood Pressure: 138 mmHg     Is BP treated: No     HDL Cholesterol: 46.2 mg/dL     Total Cholesterol: 180 mg/dL    Past Surgical History:  Procedure Laterality Date   ATRIAL FIBRILLATION ABLATION N/A 09/08/2022   Procedure: ATRIAL FIBRILLATION ABLATION;  Surgeon: Lei Pump, MD;  Location: [facility];  Service: Cardiovascular;  Laterality: N/A;   COLONOSCOPY     ECTOPIC PREGNANCY SURGERY     x 2    ESOPHAGOGASTRODUODENOSCOPY     LAPAROSCOPIC LYSIS INTESTINAL ADHESIONS     x 2   RESECTION OF MEDIASTINAL MASS N/A 06/10/2016   Procedure: RESECTION OF MEDIASTINAL MASS;  Surgeon: Norita Beauvais, MD;  Location: [facility] OR;  Service: Thoracic;  Laterality: N/A;   VIDEO ASSISTED THORACOSCOPY Right 06/10/2016   Procedure: VIDEO ASSISTED THORACOSCOPY WITH PLACEMENT OF ON Q TUNNELER;  Surgeon: Norita Beauvais, MD;  Location: [facility] OR;  Service: Thoracic;  Laterality: Right;   VIDEO BRONCHOSCOPY N/A 06/10/2016   Procedure: VIDEO BRONCHOSCOPY;  Surgeon: Norita Beauvais, MD;  Location: [facility] OR;  Service: Thoracic;  Laterality: N/A;    Social History: Social History   Tobacco Use   Smoking status: Former    Current packs/day: 0.00    Average packs/day: 1 pack/day for 36.0 years (36.0 ttl pk-yrs)    Types: Cigarettes    Start date: 65    Quit date: 2017    Years since quitting: 8.3   Smokeless tobacco: Never   Tobacco comments:    Former smoker last cigarette 2017 04/15/22  Vaping Use   Vaping status: Never Used  Substance Use  Topics   Alcohol use: No    Alcohol/week: 0.0 standard drinks of alcohol   Drug use: No   Allergies  Allergen Reactions   Sulfa Antibiotics Hives and Swelling    Family History  Problem Relation Age of Onset   Heart attack Father    Prostate cancer Father    Heart failure Father    Diabetes Mother    Clotting disorder Mother        PE   Heart failure Mother    Kidney disease Mother    Breast cancer Sister    Colon cancer Sister    Esophageal cancer Neg Hx    Rectal cancer Neg Hx    Stomach cancer Neg Hx      Prior to Admission medications   Medication Sig Start Date End Date Taking? Authorizing  Provider  ACCU-CHEK GUIDE test strip USE 1 TO CHECK BLOOD SUGARS ONCE A WEEK 11/25/22   [provider]  Accu-Chek Softclix Lancets lancets 3 (three) times daily. 07/01/23   [provider]  acetaminophen  (TYLENOL ) 500 MG tablet Take 1,000 mg by mouth every 6 (six) hours as needed for fever or headache.    [provider]  albuterol  (PROVENTIL ) (2.5 MG/3ML) 0.083% nebulizer solution USE 1 VIAL IN NEBULIZER EVERY 6 HOURS - And As Needed 07/14/23   Rosa College D, MD  albuterol  (VENTOLIN  HFA) 108 (90 Base) MCG/ACT inhaler INHALE 1 TO 2 PUFFS INTO THE LUNGS EVERY 4 HOURS AS NEEDED FOR WHEEZING OR SHORTNESS OF BREATH 10/09/23   Young, Rupert Counts D, MD  ALPRAZolam  (XANAX ) 1 MG tablet TAKE 1 TABLET (1 MG TOTAL) BY MOUTH EVERY 6 (SIX) HOURS AS NEEDED. FOR ANXIETY 09/11/23   Rosa College D, MD  apixaban  (ELIQUIS ) 5 MG TABS tablet Take 1 tablet by mouth twice daily 07/25/23   Eilleen Grates, MD  dicyclomine  (BENTYL ) 20 MG tablet Take 1 tablet (20 mg total) by mouth every 6 (six) hours as needed for spasms. 12/12/23   Graciella Lavender, PA  diltiazem  (CARDIZEM ) 30 MG tablet TAKE 1 TABLET (30 MG TOTAL) BY MOUTH EVERY 4 (FOUR) HOURS AS NEEDED (ELEVATED HEART RATE). 04/14/23   Eilleen Grates, MD  esomeprazole  (NEXIUM ) 20 MG capsule Take 20 mg by mouth daily as needed (acid  reflux). 08/19/22   [provider]  fluocinonide (LIDEX) 0.05 % external solution Apply 1 Application topically daily as needed. 11/06/23   [provider]  furosemide  (LASIX ) 40 MG tablet Take 1 tablet by mouth twice daily 02/10/23   Eilleen Grates, MD  Multiple Vitamin (MULTIVITAMIN) tablet Take 1 tablet by mouth daily.    [provider]  rosuvastatin  (CRESTOR ) 5 MG tablet Take 1 tablet (5 mg total) by mouth every other day. 08/17/23   Eilleen Grates, MD  valACYclovir (VALTREX) 1000 MG tablet as needed. 11/21/22   [provider]  Vitamin D , Ergocalciferol , (DRISDOL ) 1.25 MG (50000 UNIT) CAPS capsule Take 50,000 Units by mouth every 7 (seven) days. 11/21/22   [provider]    Physical Exam: Vitals:   12/13/23 1832 12/13/23 2207  BP: 133/75 (!) 106/50  Pulse: 80 76  Resp: 18 18  Temp: 98.1 F (36.7 C) 98 F (36.7 C)  TempSrc: Oral   SpO2: 100% 98%  Weight: 88 kg   Height: 5\' 6"  (1.676 m)    Constitutional: Resting in bed, NAD, calm, comfortable Eyes: EOMI, lids and conjunctivae normal ENMT: Mucous membranes are moist. Posterior pharynx clear of any exudate or lesions.Normal dentition.  Neck: normal, supple, no masses. Respiratory: clear to auscultation bilaterally, no wheezing, no crackles. Normal respiratory effort. No accessory muscle use.  Cardiovascular: Regular rate and rhythm, no murmurs / rubs / gallops. No extremity edema. 2+ pedal pulses. Abdomen: Mild generalized tenderness with deep palpation, no masses palpated. Musculoskeletal: no clubbing / cyanosis. No joint deformity upper and lower extremities. Good ROM, no contractures. Normal muscle tone.  Skin: no rashes, lesions, ulcers. No induration Neurologic: Sensation intact. Strength 5/5 in all 4.  Psychiatric: Alert and oriented x 3. Normal mood.   EKG: Personally reviewed. Sinus rhythm, rate 76, no acute ischemic changes.  Assessment/Plan Principal Problem:    Hypokalemia Active Problems:   Diarrhea   PAF (paroxysmal atrial fibrillation) (HCC)   Hyperlipidemia with target LDL less than 100   Essential hypertension  COPD mixed type [facility])   Anxiety   Felicia Odom is a 68 y.o. female with medical history significant for PAF s/p ablation on Eliquis , COPD, HTN, HLD, anxiety who is admitted with hypokalemia.  Assessment and Plan: Hypokalemia: Secondary to GI losses.  Magnesium  is 2.0.  Supplementing via oral and IV route.  Diarrhea: Acute on chronic diarrhea.  CT A/P negative for acute pathology.  C. difficile and GI pathogen panels pending.  Continue IV fluid hydration overnight and advance diet as tolerated.  Paroxysmal atrial fibrillation: Remains in sinus rhythm on admission.  Continue Eliquis .  COPD: Stable, continue albuterol  as needed.  Hypertension: Stable, not on scheduled antihypertensives as an outpatient.  Hyperlipidemia: Continue rosuvastatin .  Anxiety: Continue home Xanax  1 mg every 6 hours as needed.   DVT prophylaxis: apixaban  (ELIQUIS ) tablet 5 mg Start: 12/14/23 0015 apixaban  (ELIQUIS ) tablet 5 mg  Code Status: Full code, confirmed with patient on admission Family Communication: Discussed with patient, she has discussed with family Disposition Plan: From home, dispo pending clinical progress Consults called: None Severity of Illness: The appropriate patient status for this patient is OBSERVATION. Observation status is judged to be reasonable and necessary in order to provide the required intensity of service to ensure the patient's safety. The patient's presenting symptoms, physical exam findings, and initial radiographic and laboratory data in the context of their medical condition is felt to place them at decreased risk for further clinical deterioration. Furthermore, it is anticipated that the patient will be medically stable for discharge from the hospital within 2 midnights of admission.   Edith Gores  MD [facility]  If 7PM-7AM, please contact night-coverage [email or web address]  12/14/2023, 12:08 AM

## 2023-12-13 NOTE — Telephone Encounter (Signed)
 Called and spoke with patient relayed Dr. Jadene Maxwell recommendations. Patient continued to report new symptoms to this nurse during this call including night sweats, and the need to hold the wall get to the bathroom during the night because her balance was off. Patient also reported she could "feel the fever in her stomach through her skin." She did not check a temperature but reports that her stomach felt hot to touch. This RN again recommended that patient go to ED to be further evaluated per Dr. Jadene Maxwell recommendations. Patient agreeable to go to ER for evaluation, states she will call her son to take her. Nurse advised patient not to drive herself and she verbalized understanding.

## 2023-12-13 NOTE — ED Provider Triage Note (Signed)
 Emergency Medicine Provider Triage Evaluation Note  Felicia Odom , a 68 y.o. female  was evaluated in triage.  Pt complains of abdominal pain. Abdominal pain with N/V/D for the past several weeks.   No recent travel or antibiotic use.  Review of Systems  Positive: N/V/D, abdominal pain Negative: Fevers   Physical Exam  BP 133/75 (BP Location: Left Arm)   Pulse 80   Temp 98.1 F (36.7 C) (Oral)   Resp 18   Ht 5\' 6"  (1.676 m)   Wt 88 kg   SpO2 100%   BMI 31.31 kg/m  Gen:   Awake, no distress   Resp:  Normal effort  MSK:   Moves extremities without difficulty  Other:    Medical Decision Making  Medically screening exam initiated at 7:32 PM.  Appropriate orders placed.  [name] was informed that the remainder of the evaluation will be completed by another provider, this initial triage assessment does not replace that evaluation, and the importance of remaining in the ED until their evaluation is complete.   Sonnie Dusky, PA-C 12/13/23 1934

## 2023-12-13 NOTE — Telephone Encounter (Signed)
 Returned call to patient. States she is feeling "just a little better". She reports that she saw her PCP ([address])  yesterday and they did blood work and tested for UTI (which was negative). States they told her, "I don't know what to do for you." She states that she has done the C-diff testing however, when she came to pick up that container the lab didn't give her the cup for the GI pathogen panel. Her son came and picked that cup up and she is trying to get the sample for that before the end of the day. She reports that she is still having the intense pain to the abdomen even with the Dicyclomine  and Tylenol . She reports that it is affecting her ability to sleep. She was able to get around 2 hours of sleep last night and that is all. Patient became tearful on the phone and stated, "please do something to help me with this pain. I don't know what to do about the pain." Patient reports that stool is "more like chocolate milk than water now, but it does have that smell like it had before." Patient is very concerned that she may have Sapovirus again. Patient would like to know if there is anything further that can be done.

## 2023-12-13 NOTE — ED Notes (Signed)
 ED TO INPATIENT HANDOFF REPORT  Name/Age/Gender Felicia Odom 68 y.o. female  Code Status Code Status History     Date Active Date Inactive Code Status Order ID Comments User Context   09/08/2022 1703 09/09/2022 0138 Full Code [id number]  Lei Pump, MD Inpatient   09/24/2021 2019 09/27/2021 0023 Full Code [id number]  Bary Boss, DO ED   06/10/2016 1526 06/14/2016 1458 Full Code [id number]  Barrett, Malachy Scripture, PA-C Inpatient    Questions for Most Recent Historical Code Status (Order [id number])     Question Answer   By: Consent: discussion documented in EHR            Home/SNF/Other Home  Chief Complaint Hypokalemia [E87.6]  Level of Care/Admitting Diagnosis ED Disposition     ED Disposition  Admit   Condition  --   Comment  Hospital Area: [facility] [[id number]]  Level of Care: Telemetry [5]  Admit to tele based on following criteria: Monitor QTC interval  May place patient in observation at [facility] or [facility] if equivalent level of care is available:: No  Covid Evaluation: Asymptomatic - no recent exposure (last 10 days) testing not required  Diagnosis: Hypokalemia [172180]  Admitting Physician: Kenny Peals [[id number]]  Attending Physician: Kenny Peals [[id number]]          Medical History Past Medical History:  Diagnosis Date   Anxiety    Aortic atherosclerosis (HCC) 10/06/2021   Arthritis    neck   Cannabis dependence with current use (HCC) 07/26/2016   Chronic back pain    Chronic bronchitis (HCC)    Chronic diastolic CHF (congestive heart failure) (HCC)    Chronic neck pain    Cocaine abuse (HCC) 04/05/2021   COPD mixed type (HCC) 02/18/2019   Office Spirometry 10/21/2015-slight restriction of exhaled volume, mild obstruction. FVC 2.69/78%, FEV1 2.04/76%, FEV1/FVC 0.76, FEF 25-75 percent 1.68/65% PFT 04/27/2016-minimal obstructive airways disease, minimal diffusion defect, insignificant response to  bronchodilator. FVC 2.79/78%, FEV1 2.11/76%, ratio 0.76, FEF 25-75 percent 1.81/72%, TLC 92%, DLCO 76%.   DDD (degenerative disc disease), cervical 06/15/2015   Diverticulosis    Eczema 11/29/2017   Encounter for long-term opiate analgesic use 07/06/2017   Essential hypertension 09/25/2015   GERD (gastroesophageal reflux disease)    takes Nexium  daily   Helicobacter pylori gastritis 08/2019   Tx and eradicated   Hyperlipidemia    not on any meds bc refused to pick up from pharmacy   Long-term current use of opiate analgesic 04/05/2021   Moderate episode of recurrent major depressive disorder (HCC) 01/28/2020   Obesity (BMI 30.0-34.9) 12/16/2021   Obsessive-compulsive disorder 06/08/2007   Qualifier: Diagnosis of  By: Jerilynn Montenegro     PAF (paroxysmal atrial fibrillation) (HCC) 12/16/2021   PANIC DISORDER 06/08/2007   Qualifier: Diagnosis of  By: Jerilynn Montenegro     Pneumonia    2016   PONV (postoperative nausea and vomiting)    Primary osteoarthritis involving multiple joints 01/21/2015   Seborrheic dermatitis of scalp 02/11/2015   Seizures (HCC)    in the 80's   Thyroid  nodule 07/30/2020   IMPRESSION: No significant interval change of bilateral thyroid  nodules.   The above is in keeping with the ACR TI-RADS recommendations - J Am Coll Radiol 2017;14:587-595.     Electronically Signed   By: Elester Grim M.D.   On: 08/17/2020 14:18   Type II diabetes mellitus with manifestations (HCC) 02/18/2019   The 10-year ASCVD risk score (  Noelia Batman., et al., 2013) is: 9.1%   Values used to calculate the score:     Age: 89 years     Sex: Female     Is Non-Hispanic African American: No     Diabetic: Yes     Tobacco smoker: No     Systolic Blood Pressure: 138 mmHg     Is BP treated: No     HDL Cholesterol: 46.2 mg/dL     Total Cholesterol: 180 mg/dL    Allergies Allergies  Allergen Reactions   Sulfa Antibiotics Hives and Swelling    IV Location/Drains/Wounds Patient Lines/Drains/Airways Status      Active Line/Drains/Airways     Name Placement date Placement time Site Days   Peripheral IV 12/13/23 20 G Right Antecubital 12/13/23  1938  Antecubital  less than 1            Labs/Imaging Results for orders placed or performed during the hospital encounter of 12/13/23 (from the past 48 hours)  Comprehensive metabolic panel     Status: Abnormal   Collection Time: 12/13/23  7:39 PM  Result Value Ref Range   Sodium 134 (L) 135 - 145 mmol/L   Potassium 2.6 (LL) 3.5 - 5.1 mmol/L    Comment: CRITICAL RESULT CALLED TO, READ BACK BY AND VERIFIED WITH SHEPARD, H. RN AT 2026 ON 5.21.25. FA    Chloride 98 98 - 111 mmol/L   CO2 25 22 - 32 mmol/L   Glucose, Bld 124 (H) 70 - 99 mg/dL    Comment: Glucose reference range applies only to samples taken after fasting for at least 8 hours.   BUN 14 8 - 23 mg/dL   Creatinine, Ser 1.61 0.44 - 1.00 mg/dL   Calcium  9.3 8.9 - 10.3 mg/dL   Total Protein 7.7 6.5 - 8.1 g/dL   Albumin 4.0 3.5 - 5.0 g/dL   AST 25 15 - 41 U/L   ALT 22 0 - 44 U/L   Alkaline Phosphatase 77 38 - 126 U/L   Total Bilirubin 0.8 0.0 - 1.2 mg/dL   GFR, Estimated >09 >60 mL/min    Comment: (NOTE) Calculated using the CKD-EPI Creatinine Equation (2021)    Anion gap 11 5 - 15    Comment: Performed at [facility], [address], [place], [place] [address]  CBC with Differential     Status: Abnormal   Collection Time: 12/13/23  7:39 PM  Result Value Ref Range   WBC 8.9 4.0 - 10.5 K/uL   RBC 5.43 (H) 3.87 - 5.11 MIL/uL   Hemoglobin 15.6 (H) 12.0 - 15.0 g/dL   HCT 81.1 (H) 91.4 - 78.2 %   MCV 85.6 80.0 - 100.0 fL   MCH 28.7 26.0 - 34.0 pg   MCHC 33.5 30.0 - 36.0 g/dL   RDW 95.6 21.3 - 08.6 %   Platelets 332 150 - 400 K/uL   nRBC 0.0 0.0 - 0.2 %   Neutrophils Relative % 55 %   Neutro Abs 4.9 1.7 - 7.7 K/uL   Lymphocytes Relative 33 %   Lymphs Abs 3.0 0.7 - 4.0 K/uL   Monocytes Relative 10 %   Monocytes Absolute 0.9 0.1 - 1.0 K/uL   Eosinophils  Relative 1 %   Eosinophils Absolute 0.1 0.0 - 0.5 K/uL   Basophils Relative 1 %   Basophils Absolute 0.1 0.0 - 0.1 K/uL   Immature Granulocytes 0 %   Abs Immature Granulocytes 0.03 0.00 - 0.07  K/uL    Comment: Performed at [facility], [address] [place], [place] [address]  Magnesium      Status: None   Collection Time: 12/13/23  7:39 PM  Result Value Ref Range   Magnesium  2.0 1.7 - 2.4 mg/dL    Comment: Performed at [facility], [address] [place], [place] [address]  Urinalysis, Routine w reflex microscopic -Urine, Clean Catch     Status: None   Collection Time: 12/13/23  7:43 PM  Result Value Ref Range   Color, Urine YELLOW YELLOW   APPearance CLEAR CLEAR   Specific Gravity, Urine 1.008 1.005 - 1.030   pH 6.0 5.0 - 8.0   Glucose, UA NEGATIVE NEGATIVE mg/dL   Hgb urine dipstick NEGATIVE NEGATIVE   Bilirubin Urine NEGATIVE NEGATIVE   Ketones, ur NEGATIVE NEGATIVE mg/dL   Protein, ur NEGATIVE NEGATIVE mg/dL   Nitrite NEGATIVE NEGATIVE   Leukocytes,Ua NEGATIVE NEGATIVE    Comment: Performed at [facility], [address], [place], [place] [address]   *Note: Due to a large number of results and/or encounters for the requested time period, some results have not been displayed. A complete set of results can be found in Results Review.   CT ABDOMEN PELVIS W CONTRAST Result Date: 12/13/2023 CLINICAL DATA:  Acute abdominal pain EXAM: CT ABDOMEN AND PELVIS WITH CONTRAST TECHNIQUE: Multidetector CT imaging of the abdomen and pelvis was performed using the standard protocol following bolus administration of intravenous contrast. RADIATION DOSE REDUCTION: This exam was performed according to the departmental dose-optimization program which includes automated exposure control, adjustment of the mA and/or kV according to patient size and/or use of iterative reconstruction technique. CONTRAST:  OMNIPAQUE  IOHEXOL  300 MG/ML   SOLN COMPARISON:  07/01/2023 FINDINGS: Lower chest: Lung bases demonstrate minimal scarring in the left lower lobe. Hepatobiliary: Fatty infiltration of the liver is noted. The gallbladder is within normal limits. Pancreas: Unremarkable. No pancreatic ductal dilatation or surrounding inflammatory changes. Spleen: Normal in size without focal abnormality. Adrenals/Urinary Tract: Adrenal glands are within normal limits. Kidneys demonstrate a normal enhancement pattern bilaterally. No renal calculi or obstructive changes are seen. Bladder is partially distended. Stomach/Bowel: No obstructive or inflammatory changes of the colon are noted. The appendix is not well visualized. No inflammatory changes to suggest appendicitis are noted. Small bowel and stomach are within normal limits. Vascular/Lymphatic: Aortic atherosclerosis. No enlarged abdominal or pelvic lymph nodes. Reproductive: Uterus and bilateral adnexa are unremarkable. Other: No abdominal wall hernia or abnormality. No abdominopelvic ascites. Musculoskeletal: No acute or significant osseous findings. IMPRESSION: No acute abnormality to correspond with the given clinical history. Electronically Signed   By: Violeta Grey M.D.   On: 12/13/2023 22:45    Pending Labs Unresulted Labs (From admission, onward)     Start     Ordered   12/13/23 2126  Gastrointestinal Panel by PCR , Stool  (Gastrointestinal Panel by PCR, Stool                                                                                                                                                     **  Does Not include CLOSTRIDIUM DIFFICILE testing. **If CDIFF testing is needed, place order from the "C Difficile Testing" order set.**)  Once,   URGENT        12/13/23 2125   Pending  HIV Antibody (routine testing w rflx)  (HIV Antibody (Routine testing w reflex) panel)  Tomorrow morning,   R        Pending   Pending  Basic metabolic panel  Tomorrow morning,   R        Pending   Pending   CBC  Tomorrow morning,   R        Pending            Vitals/Pain Today's Vitals   12/13/23 1831 12/13/23 1832 12/13/23 2207  BP:  133/75 (!) 106/50  Pulse:  80 76  Resp:  18 18  Temp:  98.1 F (36.7 C) 98 F (36.7 C)  TempSrc:  Oral   SpO2:  100% 98%  Weight:  88 kg   Height:  5\' 6"  (1.676 m)   PainSc: 5       Isolation Precautions Enteric precautions (UV disinfection)  Medications Medications  potassium chloride  10 mEq in 100 mL IVPB (10 mEq Intravenous New Bag/Given 12/13/23 2245)  iohexol  (OMNIPAQUE ) 300 MG/ML solution 100 mL (100 mLs Intravenous Contrast Given 12/13/23 2216)  sodium chloride  0.9 % bolus 1,000 mL (1,000 mLs Intravenous New Bag/Given 12/13/23 2157)  fentaNYL  (SUBLIMAZE ) injection 50 mcg (50 mcg Intravenous Given 12/13/23 2157)  ondansetron  (ZOFRAN ) injection 4 mg (4 mg Intravenous Given 12/13/23 2157)    Mobility walks with device

## 2023-12-13 NOTE — Telephone Encounter (Signed)
 PT returning call stating that she is in worse pain than yesterday. She wishes to have a nurse speak about her next steps. Please advise.

## 2023-12-13 NOTE — Telephone Encounter (Signed)
 The only options at this time are to go to the ED and be assessed (I recommend) or if she declines she could be seen tomorrow by one of the two APP's [facility] As[facility] and [place]) with openings.

## 2023-12-13 NOTE — Hospital Course (Addendum)
 Brief Narrative:  68 year old with history of P A-fib status post ablation on Eliquis , COPD, HTN, HLD, IBS-D, anxiety comes to the hospital with complaints of diarrhea.  Does have history of chronic loose stools but did have Sapa virus diarrheal illness back in December treated with rifaximin  with improvement.  Now has been having loose stool again for 3 weeks.  CT abdomen pelvis negative for acute pathology.  GI panel and C. difficile has been ordered.  Got admitted to the hospital due to severe hypokalemia Or during the hospitalization electrolytes were repleted, GI panel was positive for Sapo virus.  Discussed with ID, recommending supportive care.  Patient insisting to prescribe her rifaximin .  I explained her this is not really medically indicated therefore I will not be doing a prior authorization.  Medically stable for discharge.   Assessment & Plan:  Principal Problem:   Hypokalemia Active Problems:   Diarrhea   PAF (paroxysmal atrial fibrillation) (HCC)   Hyperlipidemia with target LDL less than 100   Essential hypertension   COPD mixed type (HCC)   Anxiety    Hypokalemia: Secondary to GI losses.  Replete as needed.  Improved this morning   Diarrhea Sapovirus gastroenteritis Acute on chronic diarrhea.  CT A/P negative for acute pathology.  C. difficile and GI pathogen panels is positive for Sapo virus.  Discussed with ID, no further intervention.  Patient is adamant about getting rifaximin  prescribed but then later she is complaining about the cost.  At this time I have explained to her that it is not necessarily medically indicated therefore if she wishes to pay for it I went ahead and sent it to her pharmacy otherwise at this time continue supportive care.  Follow-up patient GI   Paroxysmal atrial fibrillation: Remains in sinus rhythm on admission.  Continue Eliquis .   COPD: Stable, continue albuterol  as needed.   Hypertension: Stable, not on scheduled antihypertensives as  an outpatient.   Hyperlipidemia: Continue rosuvastatin .   Anxiety: Continue home Xanax  1 mg every 6 hours as needed.  DVT prophylaxis: apixaban  (ELIQUIS ) tablet 5 mg      Code Status: Full Code Family Communication:   Hopefully diarrhea improves over next 24 hours    Subjective: Patient will wants to go home today.  No complaints  Examination:  General exam: Appears calm and comfortable  Respiratory system: Clear to auscultation. Respiratory effort normal. Cardiovascular system: S1 & S2 heard, RRR. No JVD, murmurs, rubs, gallops or clicks. No pedal edema. Gastrointestinal system: Abdomen is nondistended, soft and nontender. No organomegaly or masses felt. Normal bowel sounds heard. Central nervous system: Alert and oriented. No focal neurological deficits. Extremities: Symmetric 5 x 5 power. Skin: No rashes, lesions or ulcers Psychiatry: Judgement and insight appear normal. Mood & affect appropriate.

## 2023-12-13 NOTE — ED Provider Notes (Addendum)
 [place] EMERGENCY DEPARTMENT AT [facility] Provider Note   CSN: [id number] Arrival date & time: 12/13/23  1818     History  Chief Complaint  Patient presents with   Diarrhea   Emesis   Abdominal Pain    Felicia Odom is a 68 y.o. female.  Patient is a 68 year old female who presents with diarrhea.  She says been going on about 3 to 4 weeks.  She describes it as watery diarrhea.  Nonbloody.  She also has some intermittent nausea and vomiting but not persistent.  She has associated pain across her lower abdomen.  She denies any known fevers but her abdomen feels hot per her report.  No urinary symptoms.  She had a similar episode last fall where she had diarrhea after she had a colonoscopy.  This went on for several weeks and actually required hospitalization at 1 point.  She is followed by Dr. Willy Harvest.  Reportedly per chart review, she was positive for Sapovirus.  It was felt to be possibly related to that versus IBS-D.  She says the symptoms are similar to the episode she had then.  She is currently feeling more fatigued than normal.  She has lightheadedness on standing.       Home Medications Prior to Admission medications   Medication Sig Start Date End Date Taking? Authorizing Provider  ACCU-CHEK GUIDE test strip USE 1 TO CHECK BLOOD SUGARS ONCE A WEEK 11/25/22   [provider]  Accu-Chek Softclix Lancets lancets 3 (three) times daily. 07/01/23   [provider]  acetaminophen  (TYLENOL ) 500 MG tablet Take 1,000 mg by mouth every 6 (six) hours as needed for fever or headache.    [provider]  albuterol  (PROVENTIL ) (2.5 MG/3ML) 0.083% nebulizer solution USE 1 VIAL IN NEBULIZER EVERY 6 HOURS - And As Needed 07/14/23   Rosa College D, MD  albuterol  (VENTOLIN  HFA) 108 (90 Base) MCG/ACT inhaler INHALE 1 TO 2 PUFFS INTO THE LUNGS EVERY 4 HOURS AS NEEDED FOR WHEEZING OR SHORTNESS OF BREATH 10/09/23   Young, Clinton D, MD  ALPRAZolam   (XANAX ) 1 MG tablet TAKE 1 TABLET (1 MG TOTAL) BY MOUTH EVERY 6 (SIX) HOURS AS NEEDED. FOR ANXIETY 09/11/23   Rosa College D, MD  apixaban  (ELIQUIS ) 5 MG TABS tablet Take 1 tablet by mouth twice daily 07/25/23   Eilleen Grates, MD  dicyclomine  (BENTYL ) 20 MG tablet Take 1 tablet (20 mg total) by mouth every 6 (six) hours as needed for spasms. 12/12/23   Graciella Lavender, PA  diltiazem  (CARDIZEM ) 30 MG tablet TAKE 1 TABLET (30 MG TOTAL) BY MOUTH EVERY 4 (FOUR) HOURS AS NEEDED (ELEVATED HEART RATE). 04/14/23   Eilleen Grates, MD  esomeprazole  (NEXIUM ) 20 MG capsule Take 20 mg by mouth daily as needed (acid reflux). 08/19/22   [provider]  fluocinonide (LIDEX) 0.05 % external solution Apply 1 Application topically daily as needed. 11/06/23   [provider]  furosemide  (LASIX ) 40 MG tablet Take 1 tablet by mouth twice daily 02/10/23   Eilleen Grates, MD  Multiple Vitamin (MULTIVITAMIN) tablet Take 1 tablet by mouth daily.    [provider]  rosuvastatin  (CRESTOR ) 5 MG tablet Take 1 tablet (5 mg total) by mouth every other day. 08/17/23   Eilleen Grates, MD  valACYclovir (VALTREX) 1000 MG tablet as needed. 11/21/22   [provider]  Vitamin D , Ergocalciferol , (DRISDOL ) 1.25 MG (50000 UNIT) CAPS capsule Take 50,000 Units by mouth every 7 (seven)  days. 11/21/22   [provider]      Allergies    Sulfa antibiotics    Review of Systems   Review of Systems  Constitutional:  Positive for fatigue. Negative for chills, diaphoresis and fever.  HENT:  Negative for congestion, rhinorrhea and sneezing.   Eyes: Negative.   Respiratory:  Negative for cough, chest tightness and shortness of breath.   Cardiovascular:  Negative for chest pain and leg swelling.  Gastrointestinal:  Positive for abdominal pain, diarrhea, nausea and vomiting. Negative for blood in stool.  Genitourinary:  Negative for difficulty urinating, flank pain, frequency and hematuria.   Musculoskeletal:  Negative for arthralgias and back pain.  Skin:  Negative for rash.  Neurological:  Positive for light-headedness. Negative for speech difficulty, weakness, numbness and headaches.    Physical Exam Updated Vital Signs BP (!) 106/50   Pulse 76   Temp 98 F (36.7 C)   Resp 18   Ht 5\' 6"  (1.676 m)   Wt 88 kg   SpO2 98%   BMI 31.31 kg/m  Physical Exam Constitutional:      Appearance: She is well-developed.  HENT:     Head: Normocephalic and atraumatic.  Eyes:     Pupils: Pupils are equal, round, and reactive to light.  Cardiovascular:     Rate and Rhythm: Normal rate and regular rhythm.     Heart sounds: Normal heart sounds.  Pulmonary:     Effort: Pulmonary effort is normal. No respiratory distress.     Breath sounds: Normal breath sounds. No wheezing or rales.  Chest:     Chest wall: No tenderness.  Abdominal:     General: Bowel sounds are normal.     Palpations: Abdomen is soft.     Tenderness: There is abdominal tenderness in the periumbilical area. There is no guarding or rebound.  Musculoskeletal:        General: Normal range of motion.     Cervical back: Normal range of motion and neck supple.  Lymphadenopathy:     Cervical: No cervical adenopathy.  Skin:    General: Skin is warm and dry.     Findings: No rash.  Neurological:     Mental Status: She is alert and oriented to person, place, and time.     ED Results / Procedures / Treatments   Labs (all labs ordered are listed, but only abnormal results are displayed) Labs Reviewed  COMPREHENSIVE METABOLIC PANEL WITH GFR - Abnormal; Notable for the following components:      Result Value   Sodium 134 (*)    Potassium 2.6 (*)    Glucose, Bld 124 (*)    All other components within normal limits  CBC WITH DIFFERENTIAL/PLATELET - Abnormal; Notable for the following components:   RBC 5.43 (*)    Hemoglobin 15.6 (*)    HCT 46.5 (*)    All other components within normal limits   GASTROINTESTINAL PANEL BY PCR, STOOL (REPLACES STOOL CULTURE)  URINALYSIS, ROUTINE W REFLEX MICROSCOPIC  MAGNESIUM     EKG EKG Interpretation Date/Time:  Wednesday Dec 13 2023 22:03:53 EDT Ventricular Rate:  76 PR Interval:  146 QRS Duration:  109 QT Interval:  429 QTC Calculation: 483 R Axis:   46  Text Interpretation: Sinus rhythm Probable anteroseptal infarct, old since last tracing no significant change Confirmed by Hershel Los [phone]) on 12/13/2023 10:52:53 PM  Radiology CT ABDOMEN PELVIS W CONTRAST Result Date: 12/13/2023 CLINICAL DATA:  Acute abdominal pain EXAM: CT  ABDOMEN AND PELVIS WITH CONTRAST TECHNIQUE: Multidetector CT imaging of the abdomen and pelvis was performed using the standard protocol following bolus administration of intravenous contrast. RADIATION DOSE REDUCTION: This exam was performed according to the departmental dose-optimization program which includes automated exposure control, adjustment of the mA and/or kV according to patient size and/or use of iterative reconstruction technique. CONTRAST:  OMNIPAQUE  IOHEXOL  300 MG/ML  SOLN COMPARISON:  07/01/2023 FINDINGS: Lower chest: Lung bases demonstrate minimal scarring in the left lower lobe. Hepatobiliary: Fatty infiltration of the liver is noted. The gallbladder is within normal limits. Pancreas: Unremarkable. No pancreatic ductal dilatation or surrounding inflammatory changes. Spleen: Normal in size without focal abnormality. Adrenals/Urinary Tract: Adrenal glands are within normal limits. Kidneys demonstrate a normal enhancement pattern bilaterally. No renal calculi or obstructive changes are seen. Bladder is partially distended. Stomach/Bowel: No obstructive or inflammatory changes of the colon are noted. The appendix is not well visualized. No inflammatory changes to suggest appendicitis are noted. Small bowel and stomach are within normal limits. Vascular/Lymphatic: Aortic atherosclerosis. No enlarged  abdominal or pelvic lymph nodes. Reproductive: Uterus and bilateral adnexa are unremarkable. Other: No abdominal wall hernia or abnormality. No abdominopelvic ascites. Musculoskeletal: No acute or significant osseous findings. IMPRESSION: No acute abnormality to correspond with the given clinical history. Electronically Signed   By: Violeta Grey M.D.   On: 12/13/2023 22:45    Procedures Procedures    Medications Ordered in ED Medications  potassium chloride  10 mEq in 100 mL IVPB (10 mEq Intravenous New Bag/Given 12/13/23 2245)  iohexol  (OMNIPAQUE ) 300 MG/ML solution 100 mL (100 mLs Intravenous Contrast Given 12/13/23 2216)  sodium chloride  0.9 % bolus 1,000 mL (1,000 mLs Intravenous New Bag/Given 12/13/23 2157)  fentaNYL  (SUBLIMAZE ) injection 50 mcg (50 mcg Intravenous Given 12/13/23 2157)  ondansetron  (ZOFRAN ) injection 4 mg (4 mg Intravenous Given 12/13/23 2157)    ED Course/ Medical Decision Making/ A&P                                 Medical Decision Making Amount and/or Complexity of Data Reviewed Labs: ordered.  Risk Prescription drug management.   Patient is a 68 year old female who presents with diarrhea.  Is been going on about 3 to 4 weeks.  She has some increased abdominal pain and weakness.  She has been feeling dizzy and lightheaded.  She had a prior similar episode in the fall and tested positive for Sapo virus.  She is concerned because these are similar symptoms.  Labs show evidence of pretty significant hypokalemia.  Her magnesium  level is normal.  She was started on IV potassium replacement as well as IV fluids. Stool studies ordered.  CT scan of her abdomen pelvis does not show any acute abnormality.  Given her ongoing symptoms in association with the hypokalemia, will consult the hospitalist for admission.  Discussed with Dr. Lydia Sams who will admit the patient for further treatment.  CRITICAL CARE Performed by: Hershel Los Total critical care time: 70 minutes Critical  care time was exclusive of separately billable procedures and treating other patients. Critical care was necessary to treat or prevent imminent or life-threatening deterioration. Critical care was time spent personally by me on the following activities: development of treatment plan with patient and/or surrogate as well as nursing, discussions with consultants, evaluation of patient's response to treatment, examination of patient, obtaining history from patient or surrogate, ordering and performing treatments and interventions, ordering and  review of laboratory studies, ordering and review of radiographic studies, pulse oximetry and re-evaluation of patient's condition.  Final Clinical Impression(s) / ED Diagnoses Final diagnoses:  Diarrhea, unspecified type  Hypokalemia  Lower abdominal pain    Rx / DC Orders ED Discharge Orders     None         Hershel Los, MD 12/13/23 2313    Hershel Los, MD 12/13/23 2313

## 2023-12-14 DIAGNOSIS — E876 Hypokalemia: Secondary | ICD-10-CM | POA: Diagnosis not present

## 2023-12-14 LAB — GASTROINTESTINAL PANEL BY PCR, STOOL (REPLACES STOOL CULTURE)

## 2023-12-14 LAB — C DIFFICILE QUICK SCREEN W PCR REFLEX
C Diff antigen: NEGATIVE
C Diff interpretation: NOT DETECTED
C Diff toxin: NEGATIVE

## 2023-12-14 LAB — CLOSTRIDIUM DIFFICILE TOXIN B, QUALITATIVE, REAL-TIME PCR: Toxigenic C. Difficile by PCR: NOT DETECTED

## 2023-12-14 LAB — CBC
HCT: 40.1 % (ref 36.0–46.0)
Hemoglobin: 13.1 g/dL (ref 12.0–15.0)
MCH: 28.7 pg (ref 26.0–34.0)
MCHC: 32.7 g/dL (ref 30.0–36.0)
MCV: 87.9 fL (ref 80.0–100.0)
Platelets: 240 10*3/uL (ref 150–400)
RBC: 4.56 MIL/uL (ref 3.87–5.11)
RDW: 13 % (ref 11.5–15.5)
WBC: 6.3 10*3/uL (ref 4.0–10.5)
nRBC: 0 % (ref 0.0–0.2)

## 2023-12-14 LAB — BASIC METABOLIC PANEL WITH GFR
Anion gap: 8 (ref 5–15)
BUN: 15 mg/dL (ref 8–23)
CO2: 25 mmol/L (ref 22–32)
Calcium: 8.6 mg/dL — ABNORMAL LOW (ref 8.9–10.3)
Chloride: 105 mmol/L (ref 98–111)
Creatinine, Ser: 0.49 mg/dL (ref 0.44–1.00)
GFR, Estimated: >60 mL/min (ref >60)
Glucose, Bld: 86 mg/dL (ref 70–99)
Potassium: 3.5 mmol/L (ref 3.5–5.1)
Sodium: 138 mmol/L (ref 135–145)

## 2023-12-14 LAB — HIV ANTIBODY (ROUTINE TESTING W REFLEX): HIV Screen 4th Generation wRfx: NONREACTIVE

## 2023-12-14 MED ORDER — POTASSIUM CHLORIDE CRYS ER 20 MEQ PO TBCR
40.0000 meq | EXTENDED_RELEASE_TABLET | Freq: Once | ORAL | Status: AC
  Administered 2023-12-14: 40 meq via ORAL
  Filled 2023-12-14: qty 2

## 2023-12-14 MED ORDER — ALBUTEROL SULFATE HFA 108 (90 BASE) MCG/ACT IN AERS: 1.0000 | INHALATION_SPRAY | RESPIRATORY_TRACT | Status: DC | PRN

## 2023-12-14 MED ORDER — ALPRAZOLAM 0.5 MG PO TABS
1.0000 mg | ORAL_TABLET | Freq: Four times a day (QID) | ORAL | Status: DC | PRN
  Administered 2023-12-14 – 2023-12-15 (×5): 1 mg via ORAL
  Filled 2023-12-14 (×5): qty 2

## 2023-12-14 MED ORDER — ONDANSETRON HCL 4 MG/2ML IJ SOLN: 4.0000 mg | Freq: Four times a day (QID) | INTRAMUSCULAR | Status: DC | PRN

## 2023-12-14 MED ORDER — ROSUVASTATIN CALCIUM 5 MG PO TABS
5.0000 mg | ORAL_TABLET | ORAL | Status: DC
  Filled 2023-12-14: qty 1

## 2023-12-14 MED ORDER — SODIUM CHLORIDE 0.9% FLUSH
3.0000 mL | Freq: Two times a day (BID) | INTRAVENOUS | Status: DC
  Administered 2023-12-14 – 2023-12-15 (×4): 3 mL via INTRAVENOUS

## 2023-12-14 MED ORDER — POTASSIUM CHLORIDE 20 MEQ PO PACK
60.0000 meq | PACK | Freq: Once | ORAL | Status: AC
  Administered 2023-12-14: 60 meq via ORAL
  Filled 2023-12-14: qty 3

## 2023-12-14 MED ORDER — OXYCODONE HCL 5 MG PO TABS
5.0000 mg | ORAL_TABLET | ORAL | Status: DC | PRN
  Administered 2023-12-14 (×2): 5 mg via ORAL
  Filled 2023-12-14 (×2): qty 1

## 2023-12-14 MED ORDER — ONDANSETRON HCL 4 MG PO TABS: 4.0000 mg | ORAL_TABLET | Freq: Four times a day (QID) | ORAL | Status: DC | PRN

## 2023-12-14 MED ORDER — LACTATED RINGERS IV SOLN
INTRAVENOUS | Status: AC
Start: 2023-12-14 — End: 2023-12-14

## 2023-12-14 MED ORDER — METOPROLOL TARTRATE 5 MG/5ML IV SOLN: 5.0000 mg | INTRAVENOUS | Status: DC | PRN

## 2023-12-14 MED ORDER — ACETAMINOPHEN 325 MG PO TABS
650.0000 mg | ORAL_TABLET | Freq: Four times a day (QID) | ORAL | Status: DC | PRN
  Administered 2023-12-14 (×3): 650 mg via ORAL
  Filled 2023-12-14 (×3): qty 2

## 2023-12-14 MED ORDER — APIXABAN 5 MG PO TABS
5.0000 mg | ORAL_TABLET | Freq: Two times a day (BID) | ORAL | Status: DC
  Administered 2023-12-14 – 2023-12-15 (×4): 5 mg via ORAL
  Filled 2023-12-14 (×4): qty 1

## 2023-12-14 MED ORDER — ACETAMINOPHEN 650 MG RE SUPP: 650.0000 mg | Freq: Four times a day (QID) | RECTAL | Status: DC | PRN

## 2023-12-14 MED ORDER — DICYCLOMINE HCL 20 MG PO TABS
20.0000 mg | ORAL_TABLET | Freq: Four times a day (QID) | ORAL | Status: DC | PRN
  Administered 2023-12-14: 20 mg via ORAL
  Filled 2023-12-14 (×2): qty 1

## 2023-12-14 MED ORDER — SENNOSIDES-DOCUSATE SODIUM 8.6-50 MG PO TABS
1.0000 | ORAL_TABLET | Freq: Every evening | ORAL | Status: DC | PRN
Start: 2023-12-14 — End: 2023-12-15

## 2023-12-14 MED ORDER — HYDRALAZINE HCL 20 MG/ML IJ SOLN: 10.0000 mg | INTRAMUSCULAR | Status: DC | PRN

## 2023-12-14 MED ORDER — ALBUTEROL SULFATE (2.5 MG/3ML) 0.083% IN NEBU: 2.5000 mg | INHALATION_SOLUTION | RESPIRATORY_TRACT | Status: DC | PRN

## 2023-12-14 NOTE — Progress Notes (Signed)
 PROGRESS NOTE    Felicia Odom  NWG:[id number] DOB: [birth year] DOA: 12/13/2023 PCP: Ulysees Gander, MD    Brief Narrative:  68 year old with history of P A-fib status post ablation on Eliquis , COPD, HTN, HLD, IBS-D, anxiety comes to the hospital with complaints of diarrhea.  Does have history of chronic loose stools but did have Sapa virus diarrheal illness back in December treated with rifaximin  with improvement.  Now has been having loose stool again for 3 weeks.  CT abdomen pelvis negative for acute pathology.  GI panel and C. difficile has been ordered.  Got admitted to the hospital due to severe hypokalemia   Assessment & Plan:  Principal Problem:   Hypokalemia Active Problems:   Diarrhea   PAF (paroxysmal atrial fibrillation) (HCC)   Hyperlipidemia with target LDL less than 100   Essential hypertension   COPD mixed type (HCC)   Anxiety    Hypokalemia: Secondary to GI losses.  Replete as needed.  Improved this morning   Diarrhea Sapovirus gastroenteritis Acute on chronic diarrhea.  CT A/P negative for acute pathology.  C. difficile and GI pathogen panels is positive for Sapo virus.  Supportive care and treatment   Paroxysmal atrial fibrillation: Remains in sinus rhythm on admission.  Continue Eliquis .   COPD: Stable, continue albuterol  as needed.   Hypertension: Stable, not on scheduled antihypertensives as an outpatient.   Hyperlipidemia: Continue rosuvastatin .   Anxiety: Continue home Xanax  1 mg every 6 hours as needed.  DVT prophylaxis: apixaban  (ELIQUIS ) tablet 5 mg      Code Status: Full Code Family Communication:   Hopefully diarrhea improves over next 24 hours    Subjective: Still having significant diarrhea.  Overall feeling weak   Examination:  General exam: Appears calm and comfortable  Respiratory system: Clear to auscultation. Respiratory effort normal. Cardiovascular system: S1 & S2 heard, RRR. No JVD, murmurs, rubs, gallops or  clicks. No pedal edema. Gastrointestinal system: Abdomen is nondistended, soft and nontender. No organomegaly or masses felt. Normal bowel sounds heard. Central nervous system: Alert and oriented. No focal neurological deficits. Extremities: Symmetric 5 x 5 power. Skin: No rashes, lesions or ulcers Psychiatry: Judgement and insight appear normal. Mood & affect appropriate.                Diet Orders (From admission, onward)     Start     Ordered   12/14/23 0003  Diet Heart Room service appropriate? Yes; Fluid consistency: Thin  Diet effective now       Question Answer Comment  Room service appropriate? Yes   Fluid consistency: Thin      12/14/23 0002            Objective: Vitals:   12/14/23 0015 12/14/23 0047 12/14/23 0356 12/14/23 0731  BP: 133/83  (!) 103/58 (!) 99/55  Pulse: 81  72 68  Resp: 18  18 18   Temp: 98.6 F (37 C)  98 F (36.7 C) 97.9 F (36.6 C)  TempSrc: Oral  Oral Oral  SpO2: 96%  95% 97%  Weight:  87.6 kg    Height:  5\' 6"  (1.676 m)      Intake/Output Summary (Last 24 hours) at 12/14/2023 1100 Last data filed at 12/14/2023 0721 Gross per 24 hour  Intake 1117.73 ml  Output --  Net 1117.73 ml   Filed Weights   12/13/23 1832 12/14/23 0047  Weight: 88 kg 87.6 kg    Scheduled Meds:  apixaban   5 mg  Oral BID   rosuvastatin   5 mg Oral QODAY   sodium chloride  flush  3 mL Intravenous Q12H   Continuous Infusions:  Nutritional status     Body mass index is 31.17 kg/m.  Data Reviewed:   CBC: Recent Labs  Lab 12/13/23 1939 12/14/23 0539  WBC 8.9 6.3  NEUTROABS 4.9  --   HGB 15.6* 13.1  HCT 46.5* 40.1  MCV 85.6 87.9  PLT 332 240   Basic Metabolic Panel: Recent Labs  Lab 12/13/23 1939 12/14/23 0539  NA 134* 138  K 2.6* 3.5  CL 98 105  CO2 25 25  GLUCOSE 124* 86  BUN 14 15  CREATININE 0.67 0.49  CALCIUM  9.3 8.6*  MG 2.0  --    GFR: Estimated Creatinine Clearance: 76.1 mL/min (by C-G formula based on SCr of 0.49  mg/dL). Liver Function Tests: Recent Labs  Lab 12/13/23 1939  AST 25  ALT 22  ALKPHOS 77  BILITOT 0.8  PROT 7.7  ALBUMIN 4.0   No results for input(s): "LIPASE", "AMYLASE" in the last 168 hours. No results for input(s): "AMMONIA" in the last 168 hours. Coagulation Profile: No results for input(s): "INR", "PROTIME" in the last 168 hours. Cardiac Enzymes: No results for input(s): "CKTOTAL", "CKMB", "CKMBINDEX", "TROPONINI" in the last 168 hours. BNP (last 3 results) No results for input(s): "PROBNP" in the last 8760 hours. HbA1C: No results for input(s): "HGBA1C" in the last 72 hours. CBG: No results for input(s): "GLUCAP" in the last 168 hours. Lipid Profile: No results for input(s): "CHOL", "HDL", "LDLCALC", "TRIG", "CHOLHDL", "LDLDIRECT" in the last 72 hours. Thyroid  Function Tests: No results for input(s): "TSH", "T4TOTAL", "FREET4", "T3FREE", "THYROIDAB" in the last 72 hours. Anemia Panel: No results for input(s): "VITAMINB12", "FOLATE", "FERRITIN", "TIBC", "IRON", "RETICCTPCT" in the last 72 hours. Sepsis Labs: No results for input(s): "PROCALCITON", "LATICACIDVEN" in the last 168 hours.  Recent Results (from the past 240 hours)  Gastrointestinal Panel by PCR , Stool     Status: Abnormal   Collection Time: 12/13/23 11:51 PM   Specimen: Stool  Result Value Ref Range Status   Campylobacter species NOT DETECTED NOT DETECTED Final   Plesimonas shigelloides NOT DETECTED NOT DETECTED Final   Salmonella species NOT DETECTED NOT DETECTED Final   Yersinia enterocolitica NOT DETECTED NOT DETECTED Final   Vibrio species NOT DETECTED NOT DETECTED Final   Vibrio cholerae NOT DETECTED NOT DETECTED Final   Enteroaggregative E coli (EAEC) NOT DETECTED NOT DETECTED Final   Enteropathogenic E coli (EPEC) NOT DETECTED NOT DETECTED Final   Enterotoxigenic E coli (ETEC) NOT DETECTED NOT DETECTED Final   Shiga like toxin producing E coli (STEC) NOT DETECTED NOT DETECTED Final    Shigella/Enteroinvasive E coli (EIEC) NOT DETECTED NOT DETECTED Final   Cryptosporidium NOT DETECTED NOT DETECTED Final   Cyclospora cayetanensis NOT DETECTED NOT DETECTED Final   Entamoeba histolytica NOT DETECTED NOT DETECTED Final   Giardia lamblia NOT DETECTED NOT DETECTED Final   Adenovirus F40/41 NOT DETECTED NOT DETECTED Final   Astrovirus NOT DETECTED NOT DETECTED Final   Norovirus GI/GII NOT DETECTED NOT DETECTED Final   Rotavirus A NOT DETECTED NOT DETECTED Final   Sapovirus (I, II, IV, and V) DETECTED (A) NOT DETECTED Final    Comment: Performed at [facility], [address] [place], [place] [address]         Radiology Studies: CT ABDOMEN PELVIS W CONTRAST Result Date: 12/13/2023 CLINICAL DATA:  Acute abdominal pain EXAM: CT  ABDOMEN AND PELVIS WITH CONTRAST TECHNIQUE: Multidetector CT imaging of the abdomen and pelvis was performed using the standard protocol following bolus administration of intravenous contrast. RADIATION DOSE REDUCTION: This exam was performed according to the departmental dose-optimization program which includes automated exposure control, adjustment of the mA and/or kV according to patient size and/or use of iterative reconstruction technique. CONTRAST:  OMNIPAQUE  IOHEXOL  300 MG/ML  SOLN COMPARISON:  07/01/2023 FINDINGS: Lower chest: Lung bases demonstrate minimal scarring in the left lower lobe. Hepatobiliary: Fatty infiltration of the liver is noted. The gallbladder is within normal limits. Pancreas: Unremarkable. No pancreatic ductal dilatation or surrounding inflammatory changes. Spleen: Normal in size without focal abnormality. Adrenals/Urinary Tract: Adrenal glands are within normal limits. Kidneys demonstrate a normal enhancement pattern bilaterally. No renal calculi or obstructive changes are seen. Bladder is partially distended. Stomach/Bowel: No obstructive or inflammatory changes of the colon are noted. The appendix is not well  visualized. No inflammatory changes to suggest appendicitis are noted. Small bowel and stomach are within normal limits. Vascular/Lymphatic: Aortic atherosclerosis. No enlarged abdominal or pelvic lymph nodes. Reproductive: Uterus and bilateral adnexa are unremarkable. Other: No abdominal wall hernia or abnormality. No abdominopelvic ascites. Musculoskeletal: No acute or significant osseous findings. IMPRESSION: No acute abnormality to correspond with the given clinical history. Electronically Signed   By: Violeta Grey M.D.   On: 12/13/2023 22:45           LOS: 0 days   Time spent= 35 mins    Maggie Schooner, MD [facility]  If 7PM-7AM, please contact night-coverage  12/14/2023, 11:00 AM

## 2023-12-14 NOTE — TOC Initial Note (Signed)
 Transition of Care [facility]) - Initial/Assessment Note    Patient Details  Name: Felicia Odom MRN: [id number] Date of Birth: Nov 01, 1955  [facility] CM/SW Contact:    Ruben Corolla, RN Phone Number: 12/14/2023, 11:51 AM  Clinical Narrative: d/c plan home.                   Expected Discharge Plan: Home/Self Care Barriers to Discharge: Continued Medical Work up   Patient Goals and CMS Choice Patient states their goals for this hospitalization and ongoing recovery are:: Home CMS Medicare.gov Compare Post Acute Care list provided to:: Patient Choice offered to / list presented to : Patient Price ownership interest in [facility].provided to:: Patient    Expected Discharge Plan and Services                                              Prior Living Arrangements/Services                       Activities of Daily Living   ADL Screening (condition at time of admission) Independently performs ADLs?: Yes (appropriate for developmental age) Is the patient deaf or have difficulty hearing?: No Does the patient have difficulty seeing, even when wearing glasses/contacts?: No Does the patient have difficulty concentrating, remembering, or making decisions?: No  Permission Sought/Granted                  Emotional Assessment              Admission diagnosis:  Hypokalemia [E87.6] Lower abdominal pain [R10.30] Diarrhea, unspecified type [R19.7] Patient Active Problem List   Diagnosis Date Noted   Diarrhea 12/13/2023   Hypokalemia 06/04/2022   Hypercoagulable state due to paroxysmal atrial fibrillation (HCC) 04/15/2022   Mild aortic stenosis 03/10/2022   Obesity (BMI 30.0-34.9) 12/16/2021   PAF (paroxysmal atrial fibrillation) (HCC) 12/16/2021   Chest pain of uncertain etiology 10/06/2021   Aortic atherosclerosis (HCC) 10/06/2021   Arthritis 10/05/2021   Chronic back pain 10/05/2021   Chronic bronchitis (HCC)  10/05/2021   Chronic neck pain 10/05/2021   Diverticulosis 10/05/2021   History of blood transfusion 10/05/2021   Hyperlipidemia 10/05/2021   Joint pain 10/05/2021   Joint swelling 10/05/2021   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   Nocturia 10/05/2021   Pneumonia 10/05/2021   PONV (postoperative nausea and vomiting) 10/05/2021   Seizures (HCC) 10/05/2021   Right calf pain    Anxiety    Chronic diastolic CHF (congestive heart failure) (HCC)    Atrial fibrillation with RVR (HCC) 09/24/2021   Muscle spasm 08/02/2021   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/08/2021   URI (upper respiratory infection) 07/08/2021   Long-term current use of opiate analgesic 04/05/2021   Cocaine abuse (HCC) 04/05/2021   Thyroid  nodule 07/30/2020   Moderate episode of recurrent major depressive disorder (HCC) 01/28/2020   Routine general medical examination at a health care facility 01/28/2020   Helicobacter pylori gastritis 08/2019   COPD mixed type (HCC) 02/18/2019   Type II diabetes mellitus with manifestations (HCC) 02/18/2019   Former tobacco use 10/01/2018   Eczema 11/29/2017   Encounter for long-term opiate analgesic use 07/06/2017   Cannabis dependence with current use (HCC) 07/26/2016   Essential hypertension 09/25/2015   DDD (degenerative disc disease), cervical 06/15/2015   Seborrheic dermatitis of scalp 02/11/2015  Primary osteoarthritis involving multiple joints 01/21/2015   History of colonic polyps 01/21/2015   Hyperlipidemia with target LDL less than 100 01/21/2015   Atypical chest pain 05/08/2013   GERD (gastroesophageal reflux disease) 05/08/2013   Atherosclerosis of aorta (HCC) 05/08/2013   Allergic rhinitis 10/01/2010   PANIC DISORDER 06/08/2007   Obsessive-compulsive disorder 06/08/2007   PCP:  Ulysees Gander, MD Pharmacy:   [id number] - [place], [place] - [address] [place] [address] Phone: [phone] Fax: [phone]  [facility] - [place], [place] - [address] [employer] [address] [place] [address] Phone: [phone] Fax: [phone]  [facility] [address] [place]), [place] - [address] [place] [address] Phone: [phone] Fax: [phone]     Social Drivers of Health (SDOH) Social History: SDOH Screenings   Food Insecurity: No Food Insecurity (12/14/2023)  Housing: Low Risk  (12/14/2023)  Transportation Needs: No Transportation Needs (12/14/2023)  Utilities: Not At Risk (12/14/2023)  Depression (PHQ2-9): Low Risk  (10/28/2020)  Social Connections: Moderately Integrated (12/14/2023)  Tobacco Use: Medium Risk (12/13/2023)   SDOH Interventions:     Readmission Risk Interventions     No data to display

## 2023-12-14 NOTE — Progress Notes (Signed)
 Mobility Specialist - Progress Note   12/14/23 1403  Mobility  Activity Ambulated independently in hallway  Level of Assistance Independent  Assistive Device None  Distance Ambulated (ft) 300 ft  Activity Response Tolerated well  Mobility Referral Yes  Mobility visit 1 Mobility  Mobility Specialist Start Time (ACUTE ONLY) 1343  Mobility Specialist Stop Time (ACUTE ONLY) 1402  Mobility Specialist Time Calculation (min) (ACUTE ONLY) 19 min   Pt received in bed and agreeable to mobility. No complaints during session. Pt to bed after session with all needs met.    [facility]

## 2023-12-14 NOTE — Telephone Encounter (Signed)
 Inbound call from patient wanting to advise that she is at the hospital.   Patient says  Thank you so much for recommending her to go to the ED. States she is feeling 10x better.

## 2023-12-14 NOTE — Care Management Obs Status (Signed)
 MEDICARE OBSERVATION STATUS NOTIFICATION   Patient Details  Name: MALLISA ALAMEDA MRN: [id number] Date of Birth: March 04, 1956   Medicare Observation Status Notification Given:  Yes    MahabirThersia Flax, RN 12/14/2023, 11:51 AM

## 2023-12-15 ENCOUNTER — Telehealth: Payer: Self-pay | Admitting: Internal Medicine

## 2023-12-15 ENCOUNTER — Telehealth: Payer: Self-pay

## 2023-12-15 ENCOUNTER — Telehealth: Payer: Self-pay | Admitting: Cardiology

## 2023-12-15 ENCOUNTER — Other Ambulatory Visit (HOSPITAL_COMMUNITY): Payer: Self-pay

## 2023-12-15 DIAGNOSIS — K580 Irritable bowel syndrome with diarrhea: Secondary | ICD-10-CM

## 2023-12-15 DIAGNOSIS — E876 Hypokalemia: Secondary | ICD-10-CM | POA: Diagnosis not present

## 2023-12-15 LAB — CBC
HCT: 38 % (ref 36.0–46.0)
Hemoglobin: 12.2 g/dL (ref 12.0–15.0)
MCH: 28.9 pg (ref 26.0–34.0)
MCHC: 32.1 g/dL (ref 30.0–36.0)
MCV: 90 fL (ref 80.0–100.0)
Platelets: 225 10*3/uL (ref 150–400)
RBC: 4.22 MIL/uL (ref 3.87–5.11)
RDW: 13.1 % (ref 11.5–15.5)
WBC: 4.9 10*3/uL (ref 4.0–10.5)
nRBC: 0 % (ref 0.0–0.2)

## 2023-12-15 LAB — BASIC METABOLIC PANEL WITH GFR
Anion gap: 4 — ABNORMAL LOW (ref 5–15)
BUN: 14 mg/dL (ref 8–23)
CO2: 26 mmol/L (ref 22–32)
Calcium: 8.4 mg/dL — ABNORMAL LOW (ref 8.9–10.3)
Chloride: 109 mmol/L (ref 98–111)
Creatinine, Ser: 0.7 mg/dL (ref 0.44–1.00)
GFR, Estimated: >60 mL/min (ref >60)
Glucose, Bld: 113 mg/dL — ABNORMAL HIGH (ref 70–99)
Potassium: 3.4 mmol/L — ABNORMAL LOW (ref 3.5–5.1)
Sodium: 139 mmol/L (ref 135–145)

## 2023-12-15 LAB — MAGNESIUM: Magnesium: 2 mg/dL (ref 1.7–2.4)

## 2023-12-15 LAB — PHOSPHORUS: Phosphorus: 4.2 mg/dL (ref 2.5–4.6)

## 2023-12-15 MED ORDER — RIFAXIMIN 550 MG PO TABS: 550.0000 mg | ORAL_TABLET | Freq: Three times a day (TID) | ORAL | 0 refills | Status: AC

## 2023-12-15 MED ORDER — RIFAXIMIN 550 MG PO TABS
550.0000 mg | ORAL_TABLET | Freq: Two times a day (BID) | ORAL | Status: DC
  Administered 2023-12-15: 550 mg via ORAL
  Filled 2023-12-15: qty 1

## 2023-12-15 MED ORDER — POTASSIUM CHLORIDE CRYS ER 20 MEQ PO TBCR
40.0000 meq | EXTENDED_RELEASE_TABLET | Freq: Once | ORAL | Status: AC
  Administered 2023-12-15: 40 meq via ORAL
  Filled 2023-12-15: qty 2

## 2023-12-15 MED ORDER — SODIUM CHLORIDE 0.9 % IV BOLUS
1000.0000 mL | Freq: Once | INTRAVENOUS | Status: AC
  Administered 2023-12-15: 1000 mL via INTRAVENOUS

## 2023-12-15 MED ORDER — RIFAXIMIN 550 MG PO TABS: 550.0000 mg | ORAL_TABLET | Freq: Two times a day (BID) | ORAL | 0 refills | Status: DC

## 2023-12-15 MED ORDER — LOPERAMIDE HCL 2 MG PO CAPS
4.0000 mg | ORAL_CAPSULE | Freq: Once | ORAL | Status: AC
  Administered 2023-12-15: 4 mg via ORAL
  Filled 2023-12-15: qty 2

## 2023-12-15 NOTE — Telephone Encounter (Signed)
 Eilleen Grates, MD  [facility] Triage25 minutes ago (12:56 PM)   It looks like this was stopped when she was in the hospital with diarrhea and an illness.  She should be following up with her primary care or perhaps GI in relation to this and they would be the ones to restart the diuretic when she is improved from this standpoint.   Called and left detailed message per DPR of above.

## 2023-12-15 NOTE — Telephone Encounter (Signed)
 Patient called and advise me that her medication Xifaxan  550 MG [facility] [address] [facility] would need prescriber approval for that medication and it would cost her 2,000 dollars for it. Patient is requesting a call back to discuss this information. Please advise.

## 2023-12-15 NOTE — Telephone Encounter (Signed)
 Called and spoke with patient. Relayed Dr. Jadene Maxwell recommendations. She has an appointment with her PCP on Tuesday 12/19/2023. She will discuss the potassium recheck with them. Patient would like us  to try and send over Rx for Xifaxan  to pharmacy. Will resend new rx and call to d/c previous one sent by hospital. Verified correct pharmacy with patient. Patient states she doesn't feel like she needs to schedule f/u with Dr. Willy Harvest at this time, but she will reach out to our office if needed.

## 2023-12-15 NOTE — TOC Transition Note (Signed)
 Transition of Care [facility]) - Discharge Note   Patient Details  Name: Felicia Odom MRN: [id number] Date of Birth: [birth year]  Transition of Care [facility]) CM/SW Contact:  Ruben Corolla, RN Phone Number: 12/15/2023, 9:43 AM   Clinical Narrative: d/c home No CM needs.      Final next level of care: Home/Self Care Barriers to Discharge: No Barriers Identified   Patient Goals and CMS Choice Patient states their goals for this hospitalization and ongoing recovery are:: Home CMS Medicare.gov Compare Post Acute Care list provided to:: Patient Choice offered to / list presented to : Patient [place] ownership interest in [facility].provided to:: Patient    Discharge Placement                       Discharge Plan and Services Additional resources added to the After Visit Summary for                                       Social Drivers of Health (SDOH) Interventions SDOH Screenings   Food Insecurity: No Food Insecurity (12/14/2023)  Housing: Low Risk  (12/14/2023)  Transportation Needs: No Transportation Needs (12/14/2023)  Utilities: Not At Risk (12/14/2023)  Depression (PHQ2-9): Low Risk  (10/28/2020)  Social Connections: Moderately Integrated (12/14/2023)  Tobacco Use: Medium Risk (12/13/2023)     Readmission Risk Interventions     No data to display

## 2023-12-15 NOTE — Telephone Encounter (Signed)
 PA started on medication. Patient notified.

## 2023-12-15 NOTE — Telephone Encounter (Signed)
 PT returned call to add to previous message. She would like to discuss her potassium levels with a nurse. She wants Dr. Willy Harvest to closely watch it and to schedule lab work.

## 2023-12-15 NOTE — Telephone Encounter (Signed)
 Pt c/o medication issue:  1. Name of Medication: Lasix  40 mg   2. How are you currently taking this medication (dosage and times per day)? Not taking   3. Are you having a reaction (difficulty breathing--STAT)? No   4. What is your medication issue? Pt is in the hospital with [facility]. Her potassium dropped and they took her off lasix . She wants to know what to do because she is scared of being off the lasix  too long. Please advise

## 2023-12-15 NOTE — Telephone Encounter (Signed)
 Noted pt advised see alternate phone note

## 2023-12-15 NOTE — Plan of Care (Signed)

## 2023-12-15 NOTE — Telephone Encounter (Signed)
 Called and spoke with patient. She states that the hospitalist told her he spoke to Dr. Willy Harvest and she doesn't need to take this medication at home. Patient states she gets to go home today. She is concerned about outpatient monitoring of potassium levels. She states, "we can't let it get back low again. I don't know when it goes low." Patient would like input from provider on best way to proceed to ensure it does not drop again. She would like to know if she should come in to have blood work done. Advised patient to ensure she is eating foods high in potassium such as bananas and oranges. Patient verbalized understanding but states she is still concerned because, "I know I'm gonna be pooping and I don't want it to drop again like it was." Patient would also like to thank Dr. Willy Harvest for his help during her hospital stay.

## 2023-12-15 NOTE — Telephone Encounter (Signed)
 Spoke with patient. She states her cardiologist, Dr. Lavonne Prairie, told her that since he was not the doctor that paused the Lasix , patient would need to talk with her GI or PCP about starting the medication back. Patient is concerned because while she was in the hospital she gained 8 pounds, and the hospitalist told her to drink 8-10 bottles of water every day. She states that she received IV fluids in the hospital which caused her to gain the extra weight. Patient knows Lasix  can deplete potassium even more. She is questioning if this is something that Dr. Willy Harvest would determine or if it would be on her PCP to restart medication. Patient states her main concern is that her PCP will not restart the medication because she didn't prescribe it originally.

## 2023-12-15 NOTE — Telephone Encounter (Signed)
 Pharmacy Patient Advocate Encounter   Received notification from CoverMyMeds that prior authorization for Xifaxan  550MG  tablets is required/requested.   Insurance verification completed.   The patient is insured through [facility] Medicare Part D .   Per test claim: PA required; PA submitted to above mentioned insurance via CoverMyMeds Key/confirmation #/EOC [id number] Status is pending

## 2023-12-15 NOTE — Telephone Encounter (Signed)
 I called the patient.  I clarified that the Lasix  is on hold due to volume loss from the diarrhea.  She has a PCP appointment next Tuesday and she can address this then as well as hypokalemia.  She has some progress though still has watery stools less frequent and much less abdominal pain.  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  She was asking about the Xifaxan  and I said I would give it a little bit of time to see if this diarrhea does not resolve but if it persists as it did earlier this year start the Xifaxan  because that helped her then.

## 2023-12-15 NOTE — Telephone Encounter (Signed)
 [facility] stated [facility] and stated [facility] but advised that they need the providers approval as to why the prescription is necessary. Please advise, thank you.

## 2023-12-15 NOTE — Discharge Summary (Signed)
 Physician Discharge Summary  DONNIELLE ADDISON ZOX:[id number] DOB: 01-02-1956 DOA: 12/13/2023  PCP: Ulysees Gander, MD  Admit date: 12/13/2023 Discharge date: 12/15/2023  Admitted From: Home Disposition: Home  Recommendations for Outpatient Follow-up:  Follow up with PCP in 1-2 weeks Please obtain BMP/CBC in one week your next doctors visit.  Follow-up outpatient PCP Supportive care for her gastroenteritis Rifaximin  prescribed at patient's request as she has been prescribed this in the past.  I did prescribe ending pharmacy is requesting prior authorization but I have declined prior authorization as this is not really medically indicated  Discharge Condition: Stable CODE STATUS: Full code Diet recommendation: Cardiac  Brief/Interim Summary: Brief Narrative:  68 year old with history of P A-fib status post ablation on Eliquis , COPD, HTN, HLD, IBS-D, anxiety comes to the hospital with complaints of diarrhea.  Does have history of chronic loose stools but did have Sapa virus diarrheal illness back in December treated with rifaximin  with improvement.  Now has been having loose stool again for 3 weeks.  CT abdomen pelvis negative for acute pathology.  GI panel and C. difficile has been ordered.  Got admitted to the hospital due to severe hypokalemia Or during the hospitalization electrolytes were repleted, GI panel was positive for Sapo virus.  Discussed with ID, recommending supportive care.  Patient insisting to prescribe her rifaximin .  I explained her this is not really medically indicated therefore I will not be doing a prior authorization.  Medically stable for discharge.   Assessment & Plan:  Principal Problem:   Hypokalemia Active Problems:   Diarrhea   PAF (paroxysmal atrial fibrillation) (HCC)   Hyperlipidemia with target LDL less than 100   Essential hypertension   COPD mixed type (HCC)   Anxiety    Hypokalemia: Secondary to GI losses.  Replete as needed.  Improved  this morning   Diarrhea Sapovirus gastroenteritis Acute on chronic diarrhea.  CT A/P negative for acute pathology.  C. difficile and GI pathogen panels is positive for Sapo virus.  Discussed with ID, no further intervention.  Patient is adamant about getting rifaximin  prescribed but then later she is complaining about the cost.  At this time I have explained to her that it is not necessarily medically indicated therefore if she wishes to pay for it I went ahead and sent it to her pharmacy otherwise at this time continue supportive care.  Follow-up patient GI   Paroxysmal atrial fibrillation: Remains in sinus rhythm on admission.  Continue Eliquis .   COPD: Stable, continue albuterol  as needed.   Hypertension: Stable, not on scheduled antihypertensives as an outpatient.   Hyperlipidemia: Continue rosuvastatin .   Anxiety: Continue home Xanax  1 mg every 6 hours as needed.  DVT prophylaxis: apixaban  (ELIQUIS ) tablet 5 mg      Code Status: Full Code Family Communication:   Hopefully diarrhea improves over next 24 hours    Subjective: Patient will wants to go home today.  No complaints  Examination:  General exam: Appears calm and comfortable  Respiratory system: Clear to auscultation. Respiratory effort normal. Cardiovascular system: S1 & S2 heard, RRR. No JVD, murmurs, rubs, gallops or clicks. No pedal edema. Gastrointestinal system: Abdomen is nondistended, soft and nontender. No organomegaly or masses felt. Normal bowel sounds heard. Central nervous system: Alert and oriented. No focal neurological deficits. Extremities: Symmetric 5 x 5 power. Skin: No rashes, lesions or ulcers Psychiatry: Judgement and insight appear normal. Mood & affect appropriate.    Discharge Diagnoses:  Principal Problem:  Hypokalemia Active Problems:   Diarrhea   PAF (paroxysmal atrial fibrillation) (HCC)   Hyperlipidemia with target LDL less than 100   Essential hypertension   COPD mixed  type [facility])   Anxiety      Discharge Exam: Vitals:   12/14/23 2027 12/15/23 0517  BP: 116/67 112/88  Pulse: 69 67  Resp: 18 20  Temp: 98 F (36.7 C) 97.6 F (36.4 C)  SpO2: 98% 98%   Vitals:   12/14/23 0731 12/14/23 1206 12/14/23 2027 12/15/23 0517  BP: (!) 99/55 (!) 114/55 116/67 112/88  Pulse: 68 68 69 67  Resp: 18 14 18 20   Temp: 97.9 F (36.6 C) 98 F (36.7 C) 98 F (36.7 C) 97.6 F (36.4 C)  TempSrc: Oral Oral Oral Oral  SpO2: 97% 98% 98% 98%  Weight:      Height:          Discharge Instructions   Allergies as of 12/15/2023       Reactions   Sulfa Antibiotics Hives, Swelling        Medication List     PAUSE taking these medications    furosemide  40 MG tablet Wait to take this until your doctor or other care provider tells you to start again. Commonly known as: LASIX  Take 1 tablet by mouth twice daily       TAKE these medications    acetaminophen  500 MG tablet Commonly known as: TYLENOL  Take 1,000 mg by mouth every 6 (six) hours as needed for fever or headache.   albuterol  (2.5 MG/3ML) 0.083% nebulizer solution Commonly known as: PROVENTIL  USE 1 VIAL IN NEBULIZER EVERY 6 HOURS - And As Needed   albuterol  108 (90 Base) MCG/ACT inhaler Commonly known as: Ventolin  HFA INHALE 1 TO 2 PUFFS INTO THE LUNGS EVERY 4 HOURS AS NEEDED FOR WHEEZING OR SHORTNESS OF BREATH   ALPRAZolam  1 MG tablet Commonly known as: XANAX  TAKE 1 TABLET (1 MG TOTAL) BY MOUTH EVERY 6 (SIX) HOURS AS NEEDED. FOR ANXIETY   clindamycin 1 % external solution Commonly known as: CLEOCIN T Apply 1 Application topically daily.   dicyclomine  20 MG tablet Commonly known as: BENTYL  Take 1 tablet (20 mg total) by mouth every 6 (six) hours as needed for spasms.   diltiazem  30 MG tablet Commonly known as: CARDIZEM  TAKE 1 TABLET (30 MG TOTAL) BY MOUTH EVERY 4 (FOUR) HOURS AS NEEDED (ELEVATED HEART RATE).   Eliquis  5 MG Tabs tablet Generic drug: apixaban  Take 1 tablet by  mouth twice daily   esomeprazole  20 MG capsule Commonly known as: NEXIUM  Take 20 mg by mouth daily as needed (acid reflux).   fluocinonide 0.05 % external solution Commonly known as: LIDEX Apply 1 Application topically daily as needed.   multivitamin tablet Take 1 tablet by mouth daily.   nystatin  cream Commonly known as: MYCOSTATIN  Apply 1 Application topically 2 (two) times daily.   rifaximin  550 MG Tabs tablet Commonly known as: XIFAXAN  Take 1 tablet (550 mg total) by mouth 2 (two) times daily for 7 days.   rosuvastatin  5 MG tablet Commonly known as: CRESTOR  Take 1 tablet (5 mg total) by mouth every other day.   valACYclovir 1000 MG tablet Commonly known as: VALTREX as needed.   Vitamin D  (Ergocalciferol ) 1.25 MG (50000 UNIT) Caps capsule Commonly known as: DRISDOL  Take 50,000 Units by mouth every 7 (seven) days.        Allergies  Allergen Reactions   Sulfa Antibiotics Hives and Swelling  You were cared for by a hospitalist during your hospital stay. If you have any questions about your discharge medications or the care you received while you were in the hospital after you are discharged, you can call the unit and asked to speak with the hospitalist on call if the hospitalist that took care of you is not available. Once you are discharged, your primary care physician will handle any further medical issues. Please note that no refills for any discharge medications will be authorized once you are discharged, as it is imperative that you return to your primary care physician (or establish a relationship with a primary care physician if you do not have one) for your aftercare needs so that they can reassess your need for medications and monitor your lab values.  You were cared for by a hospitalist during your hospital stay. If you have any questions about your discharge medications or the care you received while you were in the hospital after you are discharged, you can  call the unit and asked to speak with the hospitalist on call if the hospitalist that took care of you is not available. Once you are discharged, your primary care physician will handle any further medical issues. Please note that NO REFILLS for any discharge medications will be authorized once you are discharged, as it is imperative that you return to your primary care physician (or establish a relationship with a primary care physician if you do not have one) for your aftercare needs so that they can reassess your need for medications and monitor your lab values.  Please request your Prim.MD to go over all Hospital Tests and Procedure/Radiological results at the follow up, please get all Hospital records sent to your Prim MD by signing hospital release before you go home.  Get CBC, CMP, 2 view Chest X ray checked  by Primary MD during your next visit or SNF MD in 5-7 days ( we routinely change or add medications that can affect your baseline labs and fluid status, therefore we recommend that you get the mentioned basic workup next visit with your PCP, your PCP may decide not to get them or add new tests based on their clinical decision)  On your next visit with your primary care physician please Get Medicines reviewed and adjusted.  If you experience worsening of your admission symptoms, develop shortness of breath, life threatening emergency, suicidal or homicidal thoughts you must seek medical attention immediately by calling 911 or calling your MD immediately  if symptoms less severe.  You Must read complete instructions/literature along with all the possible adverse reactions/side effects for all the Medicines you take and that have been prescribed to you. Take any new Medicines after you have completely understood and accpet all the possible adverse reactions/side effects.   Do not drive, operate heavy machinery, perform activities at heights, swimming or participation in water activities or  provide baby sitting services if your were admitted for syncope or siezures until you have seen by Primary MD or a Neurologist and advised to do so again.  Do not drive when taking Pain medications.   Procedures/Studies: CT ABDOMEN PELVIS W CONTRAST Result Date: 12/13/2023 CLINICAL DATA:  Acute abdominal pain EXAM: CT ABDOMEN AND PELVIS WITH CONTRAST TECHNIQUE: Multidetector CT imaging of the abdomen and pelvis was performed using the standard protocol following bolus administration of intravenous contrast. RADIATION DOSE REDUCTION: This exam was performed according to the departmental dose-optimization program which includes automated exposure control, adjustment of  the mA and/or kV according to patient size and/or use of iterative reconstruction technique. CONTRAST:  OMNIPAQUE  IOHEXOL  300 MG/ML  SOLN COMPARISON:  07/01/2023 FINDINGS: Lower chest: Lung bases demonstrate minimal scarring in the left lower lobe. Hepatobiliary: Fatty infiltration of the liver is noted. The gallbladder is within normal limits. Pancreas: Unremarkable. No pancreatic ductal dilatation or surrounding inflammatory changes. Spleen: Normal in size without focal abnormality. Adrenals/Urinary Tract: Adrenal glands are within normal limits. Kidneys demonstrate a normal enhancement pattern bilaterally. No renal calculi or obstructive changes are seen. Bladder is partially distended. Stomach/Bowel: No obstructive or inflammatory changes of the colon are noted. The appendix is not well visualized. No inflammatory changes to suggest appendicitis are noted. Small bowel and stomach are within normal limits. Vascular/Lymphatic: Aortic atherosclerosis. No enlarged abdominal or pelvic lymph nodes. Reproductive: Uterus and bilateral adnexa are unremarkable. Other: No abdominal wall hernia or abnormality. No abdominopelvic ascites. Musculoskeletal: No acute or significant osseous findings. IMPRESSION: No acute abnormality to correspond with  the given clinical history. Electronically Signed   By: Violeta Grey M.D.   On: 12/13/2023 22:45     The results of significant diagnostics from this hospitalization (including imaging, microbiology, ancillary and laboratory) are listed below for reference.     Microbiology: Recent Results (from the past 240 hours)  Clostridium difficile Toxin B, Qualitative, Real-Time PCR     Status: None   Collection Time: 12/13/23  7:59 AM   Specimen: Stool  Result Value Ref Range Status   Toxigenic C. Difficile by PCR NOT DETECTED NOT DETECTED Final    Comment: . This test is for use only with liquid or soft stools;  performance characteristics of other clinical specimen  types have not been established. . This assay was performed by Cepheid GeneXpert(R) PCR. The performance characteristics of this assay have been determined by [employer]. Performance characteristics refer to the analytical performance of the test. . For additional information, please refer to http://education.[facility].com/faq/FAQ136 (This link is being provided for informational/educational purposes only.) .   Gastrointestinal Panel by PCR , Stool     Status: Abnormal   Collection Time: 12/13/23 11:51 PM   Specimen: Stool  Result Value Ref Range Status   Campylobacter species NOT DETECTED NOT DETECTED Final   Plesimonas shigelloides NOT DETECTED NOT DETECTED Final   Salmonella species NOT DETECTED NOT DETECTED Final   Yersinia enterocolitica NOT DETECTED NOT DETECTED Final   Vibrio species NOT DETECTED NOT DETECTED Final   Vibrio cholerae NOT DETECTED NOT DETECTED Final   Enteroaggregative E coli (EAEC) NOT DETECTED NOT DETECTED Final   Enteropathogenic E coli (EPEC) NOT DETECTED NOT DETECTED Final   Enterotoxigenic E coli (ETEC) NOT DETECTED NOT DETECTED Final   Shiga like toxin producing E coli (STEC) NOT DETECTED NOT DETECTED Final   Shigella/Enteroinvasive E coli (EIEC) NOT DETECTED NOT DETECTED  Final   Cryptosporidium NOT DETECTED NOT DETECTED Final   Cyclospora cayetanensis NOT DETECTED NOT DETECTED Final   Entamoeba histolytica NOT DETECTED NOT DETECTED Final   Giardia lamblia NOT DETECTED NOT DETECTED Final   Adenovirus F40/41 NOT DETECTED NOT DETECTED Final   Astrovirus NOT DETECTED NOT DETECTED Final   Norovirus GI/GII NOT DETECTED NOT DETECTED Final   Rotavirus A NOT DETECTED NOT DETECTED Final   Sapovirus (I, II, IV, and V) DETECTED (A) NOT DETECTED Final    Comment: Performed at [facility], [address], [place], [place] [address]  C Difficile Quick Screen w PCR reflex  Status: None   Collection Time: 12/14/23 10:14 AM   Specimen: STOOL  Result Value Ref Range Status   C Diff antigen NEGATIVE NEGATIVE Final   C Diff toxin NEGATIVE NEGATIVE Final   C Diff interpretation No C. difficile detected.  Final    Comment: Performed at [facility], [address] [place], [place] [address]     Labs: BNP (last 3 results) No results for input(s): "BNP" in the last 8760 hours. Basic Metabolic Panel: Recent Labs  Lab 12/13/23 1939 12/14/23 0539 12/15/23 0449  NA 134* 138 139  K 2.6* 3.5 3.4*  CL 98 105 109  CO2 25 25 26   GLUCOSE 124* 86 113*  BUN 14 15 14   CREATININE 0.67 0.49 0.70  CALCIUM  9.3 8.6* 8.4*  MG 2.0  --  2.0  PHOS  --   --  4.2   Liver Function Tests: Recent Labs  Lab 12/13/23 1939  AST 25  ALT 22  ALKPHOS 77  BILITOT 0.8  PROT 7.7  ALBUMIN 4.0   No results for input(s): "LIPASE", "AMYLASE" in the last 168 hours. No results for input(s): "AMMONIA" in the last 168 hours. CBC: Recent Labs  Lab 12/13/23 1939 12/14/23 0539 12/15/23 0449  WBC 8.9 6.3 4.9  NEUTROABS 4.9  --   --   HGB 15.6* 13.1 12.2  HCT 46.5* 40.1 38.0  MCV 85.6 87.9 90.0  PLT 332 240 225   Cardiac Enzymes: No results for input(s): "CKTOTAL", "CKMB", "CKMBINDEX", "TROPONINI" in the last 168 hours. BNP: Invalid input(s):  "POCBNP" CBG: No results for input(s): "GLUCAP" in the last 168 hours. D-Dimer No results for input(s): "DDIMER" in the last 72 hours. Hgb A1c No results for input(s): "HGBA1C" in the last 72 hours. Lipid Profile No results for input(s): "CHOL", "HDL", "LDLCALC", "TRIG", "CHOLHDL", "LDLDIRECT" in the last 72 hours. Thyroid  function studies No results for input(s): "TSH", "T4TOTAL", "T3FREE", "THYROIDAB" in the last 72 hours.  Invalid input(s): "FREET3" Anemia work up No results for input(s): "VITAMINB12", "FOLATE", "FERRITIN", "TIBC", "IRON", "RETICCTPCT" in the last 72 hours. Urinalysis    Component Value Date/Time   COLORURINE YELLOW 12/13/2023 1943   APPEARANCEUR CLEAR 12/13/2023 1943   LABSPEC 1.008 12/13/2023 1943   PHURINE 6.0 12/13/2023 1943   GLUCOSEU NEGATIVE 12/13/2023 1943   GLUCOSEU NEGATIVE 10/28/2020 0827   HGBUR NEGATIVE 12/13/2023 1943   BILIRUBINUR NEGATIVE 12/13/2023 1943   BILIRUBINUR negative 08/02/2023 0957   BILIRUBINUR Negative 05/30/2023 1543   KETONESUR NEGATIVE 12/13/2023 1943   PROTEINUR NEGATIVE 12/13/2023 1943   UROBILINOGEN 1.0 08/02/2023 0957   UROBILINOGEN 0.2 10/28/2020 0827   NITRITE NEGATIVE 12/13/2023 1943   LEUKOCYTESUR NEGATIVE 12/13/2023 1943   Sepsis Labs Recent Labs  Lab 12/13/23 1939 12/14/23 0539 12/15/23 0449  WBC 8.9 6.3 4.9   Microbiology Recent Results (from the past 240 hours)  Clostridium difficile Toxin B, Qualitative, Real-Time PCR     Status: None   Collection Time: 12/13/23  7:59 AM   Specimen: Stool  Result Value Ref Range Status   Toxigenic C. Difficile by PCR NOT DETECTED NOT DETECTED Final    Comment: . This test is for use only with liquid or soft stools;  performance characteristics of other clinical specimen  types have not been established. . This assay was performed by Cepheid GeneXpert(R) PCR. The performance characteristics of this assay have been determined by [employer].  Performance characteristics refer to the analytical performance of the test. . For additional information,  please refer to http://education.[facility].com/faq/FAQ136 (This link is being provided for informational/educational purposes only.) .   Gastrointestinal Panel by PCR , Stool     Status: Abnormal   Collection Time: 12/13/23 11:51 PM   Specimen: Stool  Result Value Ref Range Status   Campylobacter species NOT DETECTED NOT DETECTED Final   Plesimonas shigelloides NOT DETECTED NOT DETECTED Final   Salmonella species NOT DETECTED NOT DETECTED Final   Yersinia enterocolitica NOT DETECTED NOT DETECTED Final   Vibrio species NOT DETECTED NOT DETECTED Final   Vibrio cholerae NOT DETECTED NOT DETECTED Final   Enteroaggregative E coli (EAEC) NOT DETECTED NOT DETECTED Final   Enteropathogenic E coli (EPEC) NOT DETECTED NOT DETECTED Final   Enterotoxigenic E coli (ETEC) NOT DETECTED NOT DETECTED Final   Shiga like toxin producing E coli (STEC) NOT DETECTED NOT DETECTED Final   Shigella/Enteroinvasive E coli (EIEC) NOT DETECTED NOT DETECTED Final   Cryptosporidium NOT DETECTED NOT DETECTED Final   Cyclospora cayetanensis NOT DETECTED NOT DETECTED Final   Entamoeba histolytica NOT DETECTED NOT DETECTED Final   Giardia lamblia NOT DETECTED NOT DETECTED Final   Adenovirus F40/41 NOT DETECTED NOT DETECTED Final   Astrovirus NOT DETECTED NOT DETECTED Final   Norovirus GI/GII NOT DETECTED NOT DETECTED Final   Rotavirus A NOT DETECTED NOT DETECTED Final   Sapovirus (I, II, IV, and V) DETECTED (A) NOT DETECTED Final    Comment: Performed at [facility], [address] [place], [place] [address]  C Difficile Quick Screen w PCR reflex     Status: None   Collection Time: 12/14/23 10:14 AM   Specimen: STOOL  Result Value Ref Range Status   C Diff antigen NEGATIVE NEGATIVE Final   C Diff toxin NEGATIVE NEGATIVE Final   C Diff interpretation No C. difficile detected.  Final     Comment: Performed at [facility], [address], [place], [place] [address]     Time coordinating discharge:  I have spent 35 minutes face to face with the patient and on the ward discussing the patients care, assessment, plan and disposition with other care givers. >50% of the time was devoted counseling the patient about the risks and benefits of treatment/Discharge disposition and coordinating care.   SIGNED:   Maggie Schooner, MD  [facility] 12/15/2023, 10:56 AM   If 7PM-7AM, please contact night-coverage

## 2023-12-15 NOTE — Telephone Encounter (Signed)
 Inbound call from patient stating she spoke with her cardiologist regarding Lasixs medication. States she was advised by Dr. Willy Harvest she would have to go back to her cardiologist to restart medication since her cardiologist prescribed this medication. Patient states her cardiologist provider will not start her back on Lasix  since he was not the provider who recommended pausing it, and advised patient to call GI provider. Patient is requesting a call back to discuss further due to being unsure how to restart medication. Patient is also requesting to also speak about potassium further. Please advise, thank you.

## 2023-12-15 NOTE — Telephone Encounter (Signed)
 1.-She needs to follow-up with primary care regarding her potassium levels.  She should reach out to them to try to have it checked next week.  That is not something we are going to be able to do easily.  She had infectious diarrhea with Sapovirus again for some reason and she should hopefully recover from this.  I can understand why she is fearful given what she went through before.  2-I am not sure and actually think that the Xifaxan  is not necessary if this is a recurrent Sapovirus infection.  However given what she went through in the past we can prescribe it again 550 mg 3 times daily for 14 days.  The indication would be IBS-D.  3-the prescription that was sent to her pharmacy is not correct and should be discontinued by contacting the pharmacy.  4 we can schedule a next available follow-up with an apple or me.  She can call back sooner as needed.

## 2023-12-15 NOTE — Telephone Encounter (Signed)
 Pharmacy Patient Advocate Encounter  Received notification from [employer] Part [employer] that Prior Authorization for Xifaxan  550MG  tablets has been APPROVED from 12-15-2023 to 12-29-2023   PA #/Case ID/Reference #: [id number]

## 2023-12-15 NOTE — Telephone Encounter (Signed)
 Patient called and stated that she was being discharged from the hospital and the doctor taking over her care there said that she will need Dr. Willy Harvest approval for  her medication Xifaxan  that she was prescribed at the hospital. Patient is requesting a call back at [phone]. Please advise.

## 2023-12-15 NOTE — Telephone Encounter (Signed)
 Returned call to patient. She states that when she got her d/c paperwork from hospital the rx for Rifaximin  was listed on her medications to take. She states that she remembers Dr. Ariel Begun telling her he specifically spoke with Dr. Willy Harvest and that she didn't need this medication. States Dr. Ariel Begun told her to "just poop it out." When patient received her d/c instructions, her nurse in the hospital told her to reach out to Dr. Willy Harvest specifically to determine if she needed this medication. Per Dr. Dorette Garb d/c note "Rifaximin  prescribed at patient's request as she has been prescribed this in the past. I did prescribe ending pharmacy is requesting prior authorization but I have declined prior authorization as this is not really medically indicated." Routing to Dr. Willy Harvest to review and advise.

## 2023-12-15 NOTE — Telephone Encounter (Signed)
 Spoke with [facility]. Verified correct prescription received.

## 2023-12-22 ENCOUNTER — Telehealth: Payer: Self-pay | Admitting: Internal Medicine

## 2023-12-22 DIAGNOSIS — R197 Diarrhea, unspecified: Secondary | ICD-10-CM

## 2023-12-22 MED ORDER — DIPHENOXYLATE-ATROPINE 2.5-0.025 MG PO TABS: ORAL_TABLET | ORAL | 0 refills | Status: DC

## 2023-12-22 NOTE — Telephone Encounter (Signed)
 Patient called and stated that she was released from the hospital last Friday and followed up with her PCP. Patient PCP recommended that she has to just having her BM to get rid of her virus she has. Patient stated that last night she had a fever and is having a dry hacking cough to the point she is scared due to her having aortic surgery years ago. Patient would like to speak to the nurse. Please advise.

## 2023-12-22 NOTE — Telephone Encounter (Signed)
 Called patient. Relayed provider message. She reports that she had not been taking Xifanan. "Everybody kept telling me not to take it, they just wanted me to poop the virus out." Patient will start medication today. Discussed continuing to push fluids, and taking Lomotil as needed. Also discussed returning to ER if symptoms continued or worsened. Patient verbalized understanding. She will have her son pick up the collection kit for the stool sample.

## 2023-12-22 NOTE — Telephone Encounter (Signed)
 Please check that she is taking the Xifaxan   I also rxed Lomotil to help the diarrhea  Try to force fluids - frequent sips at least  If gets sicker could need to return to ED for evaluation but Nl labs reassuring  Meds ordered this encounter  Medications   diphenoxylate-atropine (LOMOTIL) 2.5-0.025 MG tablet    Sig: 1-2 tablets 4 times a day as needed for diarrhea    Dispense:  90 tablet    Refill:  0    Also have her do a fecal calprotectin test.  I ordered it - she will need to come to lab and get the collection kit  Orders Placed This Encounter  Procedures   Calprotectin, Fecal

## 2023-12-22 NOTE — Telephone Encounter (Signed)
 Returned call to patient. She reports that she is still having frequent diarrhea. She reports 6 stools already this morning, with occasional incontinence. It looks like "brown light colored water." She states that yesterday she ran a low grade temp of 100.0. Reports she can still feel heat inside her belly. She now reports that she has developed a dry hacking cough and this has her very concerned as she has had cardiac surgery in the past. She states her PCP advised her on Wednesday that her lab work was WNL. She states that PCP recommended Pepto Bismol and to follow the instructions on the bottle. She also recommended probiotics which the patient started taking this morning. Patient reports that she is barely eating and can have a bm even with just drinking water. Patient states she is fearful because she just can't seem to stop having diarrhea. She reports it still has the same smell that it had before. She would like further insight on what to do.

## 2023-12-25 ENCOUNTER — Other Ambulatory Visit

## 2023-12-25 DIAGNOSIS — R197 Diarrhea, unspecified: Secondary | ICD-10-CM

## 2023-12-26 ENCOUNTER — Other Ambulatory Visit

## 2023-12-27 ENCOUNTER — Telehealth: Payer: Self-pay | Admitting: Internal Medicine

## 2023-12-27 ENCOUNTER — Ambulatory Visit: Payer: Self-pay | Admitting: Internal Medicine

## 2023-12-27 LAB — CALPROTECTIN, FECAL: Calprotectin, Fecal: 627 ug/g — ABNORMAL HIGH (ref 0–120)

## 2023-12-27 NOTE — Telephone Encounter (Signed)
 Called and spoke with patient. Patient was tearful at first, however she was able to calm down, and verbalized understanding of all discussions at the end of the phone call. 21 minutes was spent on the phone with patient. Discussed lab results and how to take the medication. Patient states that she had only been taking it 1 time per day if she was taking it at all. Encouraged patient that starting today she needs to take it tid. Patient states that she will start taking it as it was prescribed. She reports that her stool had turned more "pudding like, but now it's back to water." Follow up appointment scheduled with Sarah for 01/12/2024 at 11:10. Patient states she had her potassium level rechecked today and that she will know the results by tomorrow. Lasix  pill is still on hold at this time. Patient is monitoring her weight. Reports she is drinking 6-7 bottles of water per day, and she is having 5-6 loose stools per day. She does report that she is no longer having incontinence episodes.

## 2023-12-27 NOTE — Telephone Encounter (Signed)
 Called patient and explained calprotectin level is elevated.  She is on the Xifaxan  prescription.  She reports stools are now pudding-like but she is still having multiple bowel movements and has been having night sweats it sounds like.  She is having potassium rechecked through primary care [facility] health).  Please schedule the patient for a follow-up with an app in about 2 weeks regarding diarrhea

## 2023-12-27 NOTE — Telephone Encounter (Signed)
 PT called very upset and crying about the results Dr. Willy Harvest reviewed with her. She is very scared and did not understand a lot of the information she was given or the medication he referred to. She is requesting nurse call her for further clarity.

## 2024-01-04 ENCOUNTER — Other Ambulatory Visit: Payer: Self-pay | Admitting: Internal Medicine

## 2024-01-04 ENCOUNTER — Ambulatory Visit: Payer: Self-pay | Admitting: Internal Medicine

## 2024-01-04 NOTE — Telephone Encounter (Signed)
 Xanax  refill has been sent

## 2024-01-04 NOTE — Telephone Encounter (Signed)
 Alprazolam refilled.

## 2024-01-04 NOTE — Telephone Encounter (Signed)
 Please advise Xanax  refill request. LOV 11-13-23, last order date 09-11-23

## 2024-01-04 NOTE — Telephone Encounter (Unsigned)
 Copied from CRM [id number]. Topic: Clinical - Medication Refill >> Jan 04, 2024  2:05 PM Justina Oman C wrote: Most Recent Pulmonary Care Visit:   Provider: Dr. Rosa College Department: [facility] Pulmonary Care Date: 11/13/23  Medication(s): ALPRAZolam  (XANAX ) 1 MG tablet   Has the patient contacted their pharmacy? Yes, the pharmacy advised patient to contact the office because the office has not respond.  (Agent: If no, request that the patient contact the pharmacy for the refill. If patient does not wish to contact the pharmacy document the reason why and proceed with request.) (Agent: If yes, when and what did the pharmacy advise?)  This is the patient's preferred pharmacy:  [id number] [place], [place] - [address] [place] [address] Phone: [phone] Fax: [phone]   Is this the correct pharmacy for this prescription? No If no, delete pharmacy and type the correct one.   Has the prescription been filled recently? No  Is the patient out of the medication? Yes. Patient is very anxious and is trying not to freak out and asking for the medication send today please.  Has the patient been seen for an appointment in the last year OR does the patient have an upcoming appointment? Yes, 02/13/24  Can we respond through MyChart? No, please call back [phone]  Agent: Please be advised that Rx refills may take up to 3 business days. We ask that you follow-up with your pharmacy.

## 2024-01-04 NOTE — Telephone Encounter (Signed)
 FYI Only or Action Required?: Action required by provider  Patient is followed in Pulmonology for asthma, last seen on 11/13/2023 by Faustina Hood, MD. Called Nurse Triage reporting Anxiety. Symptoms began chronic issue. Interventions attempted: Nothing. Symptoms are: gradually worsening.  Triage Disposition: See Physician Within 24 Hours  Patient/caregiver understands and will follow disposition?: No, wishes to speak with PCP            11/13/2023 last visit with Dr Rosa College.           Copied from CRM [id number]. Topic: Clinical - Red Word Triage >> Jan 04, 2024  2:14 PM Justina Oman C wrote: Red Word that prompted transfer to Nurse Triage: Patient [phone] states she is on the edge, and trying not to get freak out because she is out of ALPRAZolam  (XANAX ) 1 MG tablet. Patient states she's very anxious and expressing that the medication has be filled today or she'll lose it. Patient is highly stressed and states the medication helps her calm and breathing. Please advise. Reason for Disposition  Patient sounds very upset or troubled to the triager  Answer Assessment - Initial Assessment Questions 1. CONCERN: Did anything happen that prompted you to call today?      Patient realized that she was out of refills for her ALPRAZolam  (XANAX ) 1 MG tablet [326] 2. ANXIETY SYMPTOMS: Can you describe how you (your loved one; patient) have been feeling? (e.g., tense, restless, panicky, anxious, keyed up, overwhelmed, sense of impending doom).      Patient states she always is anxious 3. ONSET: How long have you been feeling this way? (e.g., hours, days, weeks)     Patient states that she is always anxious 4. SEVERITY: How would you rate the level of anxiety? (e.g., 0 - 10; or mild, moderate, severe).     mild 6. HISTORY: Have you felt this way before? Have you ever been diagnosed with an anxiety problem in the past? (e.g., generalized anxiety disorder, panic attacks,  PTSD). If Yes, ask: How was this problem treated? (e.g., medicines, counseling, etc.)     Patient states she always has anxiety and she is about to run out of her medication for this 7. RISK OF HARM - SUICIDAL IDEATION: Do you ever have thoughts of hurting or killing yourself? If Yes, ask:  Do you have these feelings now? Do you have a plan on how you would do this?     No 9. TREATMENT - THERAPIST: Do you have a counselor or therapist? Name?     No 11. PATIENT SUPPORT: Who is with you now? Who do you live with? Do you have family or friends who you can talk to?        Patient states that her son takes care of her 49. OTHER SYMPTOMS: Do you have any other symptoms? (e.g., feeling depressed, trouble concentrating, trouble sleeping, trouble breathing, palpitations or fast heartbeat, chest pain, sweating, nausea, or diarrhea)         Patient states that she has a lot of things going on but they are being handled by other healthcare teams---She states that she just wants her anxiety medication refilled from Dr Linder Revere  Protocols used: Anxiety and Panic Attack-A-AH

## 2024-01-04 NOTE — Telephone Encounter (Signed)
**Note De-identified  [place] Obfuscation** Please advise 

## 2024-01-08 ENCOUNTER — Telehealth: Payer: Self-pay | Admitting: Internal Medicine

## 2024-01-08 NOTE — Telephone Encounter (Signed)
 Patient feels these changes are significant. She agrees to an appointment 01/09/24 at 2:30 pm for evaluation.

## 2024-01-08 NOTE — Telephone Encounter (Signed)
 I called and spoke to pt. Pt informed of Dr Roxy Cedar note and verbalized understanding. NFN

## 2024-01-08 NOTE — Telephone Encounter (Signed)
 Inbound call from patient stating her stool has been black for a few weeks now. States she also has prolapsed rectum. States she has been waiting to be seen on 6/20 but was advised by her son to call to inform Dr. Willy Harvest of this information. Also stated she is not feeling better. Requesting a call back to discuss further. Please advise, thank you.

## 2024-01-08 NOTE — Progress Notes (Signed)
 Cardiology Office Note:   Date:  01/08/2024  ID:  Felicia Odom, DOB Dec 23, 1955, MRN [id number] PCP: Felicia Gander, MD  [facility] Providers Cardiologist:  Felicia Grates, MD Electrophysiologist:  Felicia Cortland Ding, MD {  History of Present Illness:   Felicia Odom is a 68 y.o. female who presents for evaluation of atrial fib.    She was in the ED and 03/25/2022 for this.  She was in atrial fib. She converted back to NSR after IV Cardizem  drip.  She did not appear to be volume overloaded.  She was started on spironolactone  to help with volume and hypokalemia.   Echo on 8/29 demonstrated normal LV function with mild AS.  Of note her event monitor which she wore in August demonstrated paroxysms of A-fib.  She has sinus bradycardia with pauses of 3 seconds as well.  She had nonsustained ventricular tachycardia up to 5 beats.  Her atrial rate with fibrillation averaged at 135.   She saw Dr. Lawana Odom and now is status post ablation.     Since I saw her her she has had no tachypalpitations.  She had some significant diarrhea that finally resolved.  I do see she was in the emergency room in December with chest discomfort and I reviewed these records for this visit.  This was not thought to be anginal.  She also has had complaints of lower extremity pain.  She is told that this is coming from her cervical neck.  She describes some soreness with walking or at rest in her popliteal fossa.  She had venous Dopplers in the emergency room and did not have any evidence of clot.  She is not having any new chest pressure, neck or arm discomfort.  She is not having any new shortness of breath, PND or orthopnea.  She has no new edema as long she takes low-dose diuretic.  ROS: ***  Studies Reviewed:    EKG:       ***  Risk Assessment/Calculations:   {Does this patient have ATRIAL FIBRILLATION?:[phone]} No BP recorded.  {Refresh Note OR Click here to enter BP  :1}***         Physical Exam:   VS:  There were no vitals taken for this visit.   Wt Readings from Last 3 Encounters:  12/14/23 193 lb 2 oz (87.6 kg)  11/20/23 195 lb (88.5 kg)  11/13/23 196 lb 9.6 oz (89.2 kg)     GEN: Well nourished, well developed in no acute distress NECK: No JVD; No carotid bruits CARDIAC: ***RR, *** murmurs, rubs, gallops RESPIRATORY:  Clear to auscultation without rales, wheezing or rhonchi  ABDOMEN: Soft, non-tender, non-distended EXTREMITIES:  No edema; No deformity   ASSESSMENT AND PLAN:   Atrial fib with RVR:  ***   I had a long conversation with the patient and she would prefer to stay on the Eliquis  which I think is not unreasonable as I cannot absolutely exclude bouts of atrial fibrillation.  Therefore, no change in therapy.  She understands the risk benefits of this approach.   Aortic atherosclerosis:   ***   She has dyslipidemia.  She was post be taking her Crestor  every other day at least and she has not been doing this.  She agrees to start.   Aortic stenosis: ***  This was mild and I Felicia follow this clinically   Leg pain: She had normal ABIs.  ***  She has decreased pulses and I Felicia check ABIs.  Follow up ***  Signed, Felicia Grates, MD

## 2024-01-09 ENCOUNTER — Encounter: Payer: Self-pay | Admitting: Gastroenterology

## 2024-01-09 ENCOUNTER — Ambulatory Visit (INDEPENDENT_AMBULATORY_CARE_PROVIDER_SITE_OTHER): Admitting: Gastroenterology

## 2024-01-09 ENCOUNTER — Other Ambulatory Visit (INDEPENDENT_AMBULATORY_CARE_PROVIDER_SITE_OTHER)

## 2024-01-09 VITALS — BP 108/72 | HR 93 | Ht 66.0 in | Wt 191.5 lb

## 2024-01-09 DIAGNOSIS — R3589 Other polyuria: Secondary | ICD-10-CM

## 2024-01-09 DIAGNOSIS — N816 Rectocele: Secondary | ICD-10-CM

## 2024-01-09 DIAGNOSIS — K529 Noninfective gastroenteritis and colitis, unspecified: Secondary | ICD-10-CM | POA: Diagnosis not present

## 2024-01-09 DIAGNOSIS — R1030 Lower abdominal pain, unspecified: Secondary | ICD-10-CM | POA: Diagnosis not present

## 2024-01-09 DIAGNOSIS — R197 Diarrhea, unspecified: Secondary | ICD-10-CM

## 2024-01-09 DIAGNOSIS — K580 Irritable bowel syndrome with diarrhea: Secondary | ICD-10-CM

## 2024-01-09 DIAGNOSIS — A0831 Calicivirus enteritis: Secondary | ICD-10-CM

## 2024-01-09 DIAGNOSIS — R195 Other fecal abnormalities: Secondary | ICD-10-CM

## 2024-01-09 DIAGNOSIS — K921 Melena: Secondary | ICD-10-CM

## 2024-01-09 LAB — CBC WITH DIFFERENTIAL/PLATELET
Basophils Absolute: 0.1 10*3/uL (ref 0.0–0.1)
Basophils Relative: 1.4 % (ref 0.0–3.0)
Eosinophils Absolute: 0.1 10*3/uL (ref 0.0–0.7)
Eosinophils Relative: 1.6 % (ref 0.0–5.0)
HCT: 44.3 % (ref 36.0–46.0)
Hemoglobin: 14.6 g/dL (ref 12.0–15.0)
Lymphocytes Relative: 27.9 % (ref 12.0–46.0)
Lymphs Abs: 2.2 10*3/uL (ref 0.7–4.0)
MCHC: 33 g/dL (ref 30.0–36.0)
MCV: 84.4 fl (ref 78.0–100.0)
Monocytes Absolute: 0.8 10*3/uL (ref 0.1–1.0)
Monocytes Relative: 9.8 % (ref 3.0–12.0)
Neutro Abs: 4.7 10*3/uL (ref 1.4–7.7)
Neutrophils Relative %: 59.3 % (ref 43.0–77.0)
Platelets: 322 10*3/uL (ref 150.0–400.0)
RBC: 5.24 Mil/uL — ABNORMAL HIGH (ref 3.87–5.11)
RDW: 14.5 % (ref 11.5–15.5)
WBC: 8 10*3/uL (ref 4.0–10.5)

## 2024-01-09 LAB — COMPREHENSIVE METABOLIC PANEL WITH GFR
ALT: 15 U/L (ref 0–35)
AST: 17 U/L (ref 0–37)
Albumin: 4.4 g/dL (ref 3.5–5.2)
Alkaline Phosphatase: 61 U/L (ref 39–117)
BUN: 9 mg/dL (ref 6–23)
CO2: 29 meq/L (ref 19–32)
Calcium: 9.2 mg/dL (ref 8.4–10.5)
Chloride: 102 meq/L (ref 96–112)
Creatinine, Ser: 0.73 mg/dL (ref 0.40–1.20)
GFR: 85.21 mL/min (ref >60.00)
Glucose, Bld: 102 mg/dL — ABNORMAL HIGH (ref 70–99)
Potassium: 3.6 meq/L (ref 3.5–5.1)
Sodium: 137 meq/L (ref 135–145)
Total Bilirubin: 0.6 mg/dL (ref 0.2–1.2)
Total Protein: 7.7 g/dL (ref 6.0–8.3)

## 2024-01-09 NOTE — Progress Notes (Addendum)
 Felicia Odom [id number] 1956/04/07   Chief Complaint: Diarrhea, black stools  Referring Provider: Ulysees Gander, MD Primary GI MD: Dr. Willy Harvest  HPI: Felicia Odom is a 68 y.o. female with past medical history of IBS-D, colon polyps, GERD, H. pylori gastritis 2021 treated and eradicated, CHF, PAF, HTN, HLD, COPD, obesity, seizures, T2DM, thyroid  nodule, MDD, OCD, cocaine abuse, cannabis use, long-term opiate use who presents today for a complaint of diarrhea and black stools.  09/15/2023 patient last seen in office by Dr. Willy Harvest for follow-up of IBS with diarrhea.  Symptoms began after colonoscopy.  She was found to have sapovirus on GI pathogen panel 06/2023.  Treatment with Xifaxan  for 14 days with resolution of symptoms.  Patient admitted to the hospital from 12/13/2023 to 12/15/2023 with complaint of diarrhea ongoing for 3 weeks.  CT abdomen and pelvis negative for acute pathology.  GI pathogen panel positive for Sapovirus. Negative C. Diff. Infectious disease recommended supportive care.   Dr. Willy Harvest prescribed another course of Xifaxan  following this admission.  12/25/2023 fecal calprotectin was elevated to 627.  01/08/2024 patient called reporting a few weeks of black stools.  MRI abdomen with and without contrast is scheduled for 01/15/2024.   Patient here today with her son.  States she has had lower abdominal and epigastric pain since having colonoscopy in September.  Found to have a rectocele, states she has seen a urogynecologist for this but they are not going to do any surgical procedures until her diarrhea resolves.  She often has pelvic pain after bowel movements.  States she was instructed to apply pressure to rectocele to help with bowel movements but she does not feel like she is able to do this.  She continues to have multiple bowel movements daily, up to 8-12, with associated fecal urgency, pelvic pain, and sensation of incomplete evacuation.  Stools  are a pudding-like consistency and for the last few weeks have been black.  Tomorrow is her last day on Xifaxan  but she denies any improvement in her symptoms taking this.  She has been eating a very low fiber diet, but son states that they recently picked up some Metamucil for her to start taking.  She had been taking a probiotic which they got on [employer] at the recommendation of one of her other healthcare providers, but this caused worsening diarrhea and she stopped taking after few days.   She sometimes will have a large bowel movement, sometimes only passing a very small piece of stool.  She has been taking Pepto-Bismol just for the last couple of days.  Not on an iron supplement. She has been drinking Pedialyte and son wonders if this is changing the color of her stools.  Pelvic pain is not new but has been ongoing for months.  She is concerned that her urine has felt very hot and that she may have a UTI.  States that her urologist Dr. Inga Manges has requested we obtain a urinalysis today.  She reports that 2 weeks ago she had a low-grade fever of 100.6 at home.  She can go from feeling cold 1 minute to hot the next, and has been having headaches.  She reports occasional nausea but no vomiting.  Denies acid reflux or heartburn.  Dr. Inga Manges has ordered an abdominal MRI which is scheduled for Monday.  Patient is very anxious and tearful regarding rectocele.   Previous GI Procedures/Imaging   CT A/P 12/13/2023 No acute abnormality to correspond with the given  clinical history.   Colonoscopy 04/13/2023  Impression:  - Decreased voluntary sphincter tone, small rectocele and exagerated descent found on digital rectal exam. She has fecal incontinence issues and chronic loose stools (negative colon biopsies 2021)  - One 15 mm polyp in the transverse colon, removed piecemeal using a cold snare. Resected and retrieved.  - Diverticulosis in the sigmoid colon.  - The examination was otherwise normal on  direct and retroflexion views.  - Personal history of colonic polyps. 10 mm ssp 2021 - Recall 3 years Path: FINAL DIAGNOSIS       1. Surgical [P], colon, transverse, polyp (1) :       - SESSILE SERRATED POLYP WITHOUT CYTOLOGIC DYSPLASIA, INFLAMED.       - NEGATIVE FOR MALIGNANCY.   Colonoscopy 09/19/2019  Impression:  - One 10 mm polyp in the transverse colon, removed with a cold snare and removed piecemeal using a hot snare. Resected and retrieved. - Diverticulosis in the sigmoid colon.  - The examined portion of the ileum was normal.  - The examination was otherwise normal on direct and retroflexion views. Path: 3. Surgical [P], random sites - COLONIC MUCOSA WITH NO SIGNIFICANT HISTOPATHOLOGIC CHANGES. - NO MICROSCOPIC COLITIS, ACTIVE INFLAMMATION OR CHRONIC CHANGES. 4. Surgical [P], colon, transverse, polyp - SESSILE SERRATED POLYP(S) WITHOUT CYTOLOGIC DYSPLASIA. - NO EVIDENCE OF CARCINOMA.  EGD 09/19/2019 Impression: - No endoscopic esophageal abnormality to explain patient' s dysphagia. Esophagus dilated. Dilated.  - Erosive gastropathy with no stigmata of recent bleeding. Biopsied.  - Duodenitis. Biopsied.  - The examination was otherwise normal. Path: 1. Surgical [P], gastric antrum - ANTRAL MUCOSA WITH MILDLY ACTIVE CHRONIC HELICOBACTER PYLORI GASTRITIS AND FOCAL INTESTINAL METAPLASIA. - WARTHIN-STARRY POSITIVE FOR HELICOBACTER PYLORI. - NO DYSPLASIA OR CARCINOMA. 2. Surgical [P], duodenal bulb - DUODENAL MUCOSA WITH BRUNNER GLAND HYPERPLASIA AND CHANGES CONSISTENT WITH PEPTIC INJURY. - NO FEATURES OF SPRUE, DYSPLASIA OR CARCINOMA.  Past Medical History:  Diagnosis Date   Anxiety    Aortic atherosclerosis (HCC) 10/06/2021   Arthritis    neck   Cannabis dependence with current use (HCC) 07/26/2016   Chronic back pain    Chronic bronchitis (HCC)    Chronic diastolic CHF (congestive heart failure) (HCC)    Chronic neck pain    Cocaine abuse (HCC) 04/05/2021   COPD  mixed type (HCC) 02/18/2019   Office Spirometry 10/21/2015-slight restriction of exhaled volume, mild obstruction. FVC 2.69/78%, FEV1 2.04/76%, FEV1/FVC 0.76, FEF 25-75 percent 1.68/65% PFT 04/27/2016-minimal obstructive airways disease, minimal diffusion defect, insignificant response to bronchodilator. FVC 2.79/78%, FEV1 2.11/76%, ratio 0.76, FEF 25-75 percent 1.81/72%, TLC 92%, DLCO 76%.   DDD (degenerative disc disease), cervical 06/15/2015   Diverticulosis    Eczema 11/29/2017   Encounter for long-term opiate analgesic use 07/06/2017   Essential hypertension 09/25/2015   GERD (gastroesophageal reflux disease)    takes Nexium  daily   Helicobacter pylori gastritis 08/2019   Tx and eradicated   Hyperlipidemia    not on any meds bc refused to pick up from pharmacy   Long-term current use of opiate analgesic 04/05/2021   Moderate episode of recurrent major depressive disorder (HCC) 01/28/2020   Obesity (BMI 30.0-34.9) 12/16/2021   Obsessive-compulsive disorder 06/08/2007   Qualifier: Diagnosis of  By: Jerilynn Montenegro     PAF (paroxysmal atrial fibrillation) (HCC) 12/16/2021   PANIC DISORDER 06/08/2007   Qualifier: Diagnosis of  By: Jerilynn Montenegro     Pneumonia    2016   PONV (postoperative nausea and vomiting)  Primary osteoarthritis involving multiple joints 01/21/2015   Seborrheic dermatitis of scalp 02/11/2015   Seizures (HCC)    in the 80's   Thyroid  nodule 07/30/2020   IMPRESSION: No significant interval change of bilateral thyroid  nodules.   The above is in keeping with the ACR TI-RADS recommendations - J Am Coll Radiol 2017;14:587-595.     Electronically Signed   By: Elester Grim M.D.   On: 08/17/2020 14:18   Type II diabetes mellitus with manifestations (HCC) 02/18/2019   The 10-year ASCVD risk score Risa Cheney DC Marieta Shorten., et al., 2013) is: 9.1%   Values used to calculate the score:     Age: 71 years     Sex: Female     Is Non-Hispanic African American: No     Diabetic: Yes     Tobacco  smoker: No     Systolic Blood Pressure: 138 mmHg     Is BP treated: No     HDL Cholesterol: 46.2 mg/dL     Total Cholesterol: 180 mg/dL    Past Surgical History:  Procedure Laterality Date   ATRIAL FIBRILLATION ABLATION N/A 09/08/2022   Procedure: ATRIAL FIBRILLATION ABLATION;  Surgeon: Lei Pump, MD;  Location: [facility];  Service: Cardiovascular;  Laterality: N/A;   COLONOSCOPY     ECTOPIC PREGNANCY SURGERY     x 2    ESOPHAGOGASTRODUODENOSCOPY     LAPAROSCOPIC LYSIS INTESTINAL ADHESIONS     x 2   RESECTION OF MEDIASTINAL MASS N/A 06/10/2016   Procedure: RESECTION OF MEDIASTINAL MASS;  Surgeon: Norita Beauvais, MD;  Location: [facility] OR;  Service: Thoracic;  Laterality: N/A;   VIDEO ASSISTED THORACOSCOPY Right 06/10/2016   Procedure: VIDEO ASSISTED THORACOSCOPY WITH PLACEMENT OF ON Q TUNNELER;  Surgeon: Norita Beauvais, MD;  Location: [facility] OR;  Service: Thoracic;  Laterality: Right;   VIDEO BRONCHOSCOPY N/A 06/10/2016   Procedure: VIDEO BRONCHOSCOPY;  Surgeon: Norita Beauvais, MD;  Location: [facility] OR;  Service: Thoracic;  Laterality: N/A;    Current Outpatient Medications  Medication Sig Dispense Refill   acetaminophen  (TYLENOL ) 500 MG tablet Take 1,000 mg by mouth every 6 (six) hours as needed for fever or headache.     albuterol  (PROVENTIL ) (2.5 MG/3ML) 0.083% nebulizer solution USE 1 VIAL IN NEBULIZER EVERY 6 HOURS - And As Needed 90 mL 11   albuterol  (VENTOLIN  HFA) 108 (90 Base) MCG/ACT inhaler INHALE 1 TO 2 PUFFS INTO THE LUNGS EVERY 4 HOURS AS NEEDED FOR WHEEZING OR SHORTNESS OF BREATH 18 each 6   ALPRAZolam  (XANAX ) 1 MG tablet TAKE 1 TABLET (1 MG TOTAL) BY MOUTH EVERY 6 (SIX) HOURS AS NEEDED. FOR ANXIETY 120 tablet 3   apixaban  (ELIQUIS ) 5 MG TABS tablet Take 1 tablet by mouth twice daily 180 tablet 1   clindamycin (CLEOCIN T) 1 % external solution Apply 1 Application topically daily.     dicyclomine  (BENTYL ) 20 MG tablet Take 1 tablet (20 mg total) by mouth every  6 (six) hours as needed for spasms. 90 tablet 1   diltiazem  (CARDIZEM ) 30 MG tablet TAKE 1 TABLET (30 MG TOTAL) BY MOUTH EVERY 4 (FOUR) HOURS AS NEEDED (ELEVATED HEART RATE). 90 tablet 0   diphenoxylate -atropine  (LOMOTIL ) 2.5-0.025 MG tablet 1-2 tablets 4 times a day as needed for diarrhea 90 tablet 0   esomeprazole  (NEXIUM ) 20 MG capsule Take 20 mg by mouth daily as needed (acid reflux).     fluocinonide (LIDEX) 0.05 % external solution Apply 1 Application  topically daily as needed.     [Paused] furosemide  (LASIX ) 40 MG tablet Take 1 tablet by mouth twice daily 180 tablet 3   Multiple Vitamin (MULTIVITAMIN) tablet Take 1 tablet by mouth daily.     nystatin  cream (MYCOSTATIN ) Apply 1 Application topically 2 (two) times daily.     rosuvastatin  (CRESTOR ) 5 MG tablet Take 1 tablet (5 mg total) by mouth every other day. 45 tablet 3   valACYclovir (VALTREX) 1000 MG tablet as needed.     Vitamin D , Ergocalciferol , (DRISDOL ) 1.25 MG (50000 UNIT) CAPS capsule Take 50,000 Units by mouth every 7 (seven) days.     No current facility-administered medications for this visit.    Allergies as of 01/09/2024 - Review Complete 12/13/2023  Allergen Reaction Noted   Sulfa antibiotics Hives and Swelling 04/04/2022    Family History  Problem Relation Age of Onset   Heart attack Father    Prostate cancer Father    Heart failure Father    Diabetes Mother    Clotting disorder Mother        PE   Heart failure Mother    Kidney disease Mother    Breast cancer Sister    Colon cancer Sister    Esophageal cancer Neg Hx    Rectal cancer Neg Hx    Stomach cancer Neg Hx     Social History   Tobacco Use   Smoking status: Former    Current packs/day: 0.00    Average packs/day: 1 pack/day for 36.0 years (36.0 ttl pk-yrs)    Types: Cigarettes    Start date: 71    Quit date: 2017    Years since quitting: 8.4   Smokeless tobacco: Never   Tobacco comments:    Former smoker last cigarette 2017 04/15/22   Vaping Use   Vaping status: Never Used  Substance Use Topics   Alcohol use: No    Alcohol/week: 0.0 standard drinks of alcohol   Drug use: No     Review of Systems:    Constitutional: No unexplained weight loss Skin: No rash or itching Cardiovascular: No chest pain, chest pressure or palpitations   Respiratory: No SOB or cough Gastrointestinal: See HPI and otherwise negative Genitourinary: Increased urinary frequency Neurological: Positive headache. No dizziness or syncope Musculoskeletal: No new muscle or joint pain Hematologic: No bleeding or bruising    Physical Exam:  Vital signs: BP 108/72   Pulse 93   Ht 5' 6 (1.676 m)   Wt 191 lb 8 oz (86.9 kg)   SpO2 96%   BMI 30.91 kg/m    Constitutional: Obese female, anxious and tearful at times regarding ongoing problems but in no acute distress, alert and cooperative Head:  Normocephalic and atraumatic.  Eyes: No scleral icterus. Conjunctiva pink. Mouth: No oral lesions. Respiratory: Respirations even and unlabored. Lungs clear to auscultation bilaterally.  No wheezes, crackles, or rhonchi.  Cardiovascular:  Regular rate and rhythm. No murmurs. No peripheral edema. Gastrointestinal:  Soft, nondistended, tender to palpation of lower abdomen. No rebound or guarding. Normal bowel sounds. No appreciable masses or hepatomegaly. Rectal:  No hemorrhoids or fissures on external exam. Stool dark brown, soft, heme negative. No visible bleeding. Neurologic:  Alert and oriented x4;  grossly normal neurologically.  Skin:   Dry and intact without significant lesions or rashes. Psychiatric: Oriented to person, place and time. Demonstrates good judgement and reason without abnormal affect or behaviors.   RELEVANT LABS AND IMAGING: CBC    Component Value  Date/Time   WBC 4.9 12/15/2023 0449   RBC 4.22 12/15/2023 0449   HGB 12.2 12/15/2023 0449   HGB 15.2 06/29/2023 0836   HCT 38.0 12/15/2023 0449   HCT 47.5 (H) 06/29/2023 0836   PLT  225 12/15/2023 0449   PLT 292 06/29/2023 0836   MCV 90.0 12/15/2023 0449   MCV 90 06/29/2023 0836   MCH 28.9 12/15/2023 0449   MCHC 32.1 12/15/2023 0449   RDW 13.1 12/15/2023 0449   RDW 13.5 06/29/2023 0836   LYMPHSABS 3.0 12/13/2023 1939   LYMPHSABS 2.0 08/25/2022 0911   MONOABS 0.9 12/13/2023 1939   EOSABS 0.1 12/13/2023 1939   EOSABS 0.2 08/25/2022 0911   BASOSABS 0.1 12/13/2023 1939   BASOSABS 0.1 08/25/2022 0911    CMP     Component Value Date/Time   NA 139 12/15/2023 0449   NA 140 06/29/2023 0836   K 3.4 (L) 12/15/2023 0449   CL 109 12/15/2023 0449   CO2 26 12/15/2023 0449   GLUCOSE 113 (H) 12/15/2023 0449   BUN 14 12/15/2023 0449   BUN 10 06/29/2023 0836   CREATININE 0.70 12/15/2023 0449   CALCIUM  8.4 (L) 12/15/2023 0449   PROT 7.7 12/13/2023 1939   PROT 6.9 06/29/2023 0836   ALBUMIN 4.0 12/13/2023 1939   ALBUMIN 4.2 06/29/2023 0836   AST 25 12/13/2023 1939   ALT 22 12/13/2023 1939   ALKPHOS 77 12/13/2023 1939   BILITOT 0.8 12/13/2023 1939   BILITOT 0.4 06/29/2023 0836   GFRNONAA >60 12/15/2023 0449   GFRAA >60 06/12/2016 0343   Echocardiogram 03/22/2022 1. Left ventricular ejection fraction, by estimation, is 60 to 65% . The left ventricle has normal function. The left ventricle has no regional wall motion abnormalities. Left ventricular diastolic parameters are consistent with Grade I diastolic dysfunction ( impaired relaxation) .  2. Left atrial size was mildly dilated.  3. Aortic valve regurgitation is mild. Mild aortic valve stenosis.  Assessment/Plan:   IBS-D Rectocele Increased urinary frequency Lower abdominal pain Dark stools Patient with change in bowel movements since having colonoscopy 03/2023.  Was having diarrhea, found to have Sapovirus on GI pathogen panel 06/2023.  Symptoms resolved after 2-week course of Xifaxan , but she then had recurrence of diarrhea.  She was admitted to the hospital 5/21-5/23/2025 and again found to be positive for  sapovirus.  Dr. Willy Harvest prescribed another course of Xifaxan  which she has almost completed but has not seen much improvement in her symptoms.  She reports up to 8-12 bowel movements a day, though sometimes these are very small bowel movements.  She does have a rectocele and question whether some of this is related to pelvic floor dysfunction.  Fecal calprotectin was elevated to 627 on 12/25/2023.  She has dark brown, heme-negative stool on exam, but says her stools have been black the last few weeks.  Having lower abdominal pain and increased urinary frequency, requesting urinalysis. Planning for abdominal MRI on Monday.   - Check CBC, CMP, urinalysis - Complete course of Xifaxan  - Increase dietary fiber gradually as tolerated. Can add Metamucil as tolerated. - Continue pelvic floor exercises - Consider repeat GI pathogen panel and C. Diff. - If negative for infection, could start imodium .    Case and plan discussed with Dr. Nandigam in office today.   Valiant Gaul, PA-C [facility] 01/09/2024, 9:03 AM     Primary gastroenterologist:  Appreciate excellent evaluation by Ms. Ka Flammer as above.  Patient continues with diarrhea problems of unclear etiology.  There are multiple possibilities.  At this point I wonder if the Sapovirus present on PCR was residual and not really active infection.  She would have had to have that twice which is possible I suppose.  She has pelvic floor dysfunction and a rectocele which is likely contributing.  The elevated fecal calprotectin in early June raises the possibility of some sort of inflammatory bowel process or microscopic colitis potentially.  This has been confusing.  There was an old order for GI pathogen panel and it looks like she went ahead and submitted that so we will see what that shows.  It was ordered by Reginal Capra.  CBC and CMET were without significant abnormality.  Her urinalysis was discolored and she was on Azo, she did  have white cells in the urine and increased bacteria so Macrobid  was prescribed.  I do not think her understanding of urogynecology plans is accurate.  The possibility of surgery or pessary was reviewed for her posterior prolapse (rectocele) but the patient declined either.  I think she will need to go back but it is appropriate to evaluate the diarrhea further potentially.  We will need to consider repeating a colonoscopy if she would be willing and do random biopsies if this persists, given the elevated calprotectin.  The alternative would be to repeat the calprotectin first.  This does sound most like IBS testing.  Kenney Peacemaker, MD, [facility]   Patient Care Team: Ulysees Gander, MD as PCP - General (Family Medicine) Eilleen Grates, MD as PCP - Cardiology (Cardiology) Lei Pump, MD as PCP - Electrophysiology (Cardiology) Szabat, Tino Foreman, [facility] (Inactive) as Pharmacist (Pharmacist)

## 2024-01-09 NOTE — Patient Instructions (Addendum)
 Your provider has requested that you go to the basement level for lab work before leaving today. Press B on the elevator. The lab is located at the first door on the left as you exit the elevator.  Due to recent changes in healthcare laws, you may see the results of your imaging and laboratory studies on MyChart before your provider has had a chance to review them.  We understand that in some cases there may be results that are confusing or concerning to you. Not all laboratory results come back in the same time frame and the provider may be waiting for multiple results in order to interpret others.  Please give us  48 hours in order for your provider to thoroughly review all the results before contacting the office for clarification of your results.    Increase dietary fiber gradually.  Complete Xifaxin.   Thank you for trusting me with your gastrointestinal care!   Valiant Gaul, PA-C   _______________________________________________________  If your blood pressure at your visit was 140/90 or greater, please contact your primary care physician to follow up on this.  _______________________________________________________  If you are age 50 or older, your body mass index should be between 23-30. Your Body mass index is 30.91 kg/m. If this is out of the aforementioned range listed, please consider follow up with your Primary Care Provider.  If you are age 54 or younger, your body mass index should be between 19-25. Your Body mass index is 30.91 kg/m. If this is out of the aformentioned range listed, please consider follow up with your Primary Care Provider.   ________________________________________________________  The [facility] providers would like to encourage you to use MYCHART to communicate with providers for non-urgent requests or questions.  Due to long hold times on the telephone, sending your provider a message by [facility] may be a faster and more efficient way to get a response.   Please allow 48 business hours for a response.  Please remember that this is for non-urgent requests.  _______________________________________________________

## 2024-01-10 ENCOUNTER — Encounter: Payer: Self-pay | Admitting: Gastroenterology

## 2024-01-10 ENCOUNTER — Ambulatory Visit: Payer: Self-pay | Admitting: Gastroenterology

## 2024-01-10 ENCOUNTER — Ambulatory Visit

## 2024-01-10 LAB — URINALYSIS, ROUTINE W REFLEX MICROSCOPIC: RBC / HPF: NONE SEEN (ref 0–?)

## 2024-01-10 MED ORDER — NITROFURANTOIN MONOHYD MACRO 100 MG PO CAPS: 100.0000 mg | ORAL_CAPSULE | Freq: Two times a day (BID) | ORAL | 0 refills | Status: AC

## 2024-01-11 ENCOUNTER — Ambulatory Visit: Attending: Cardiology | Admitting: Cardiology

## 2024-01-11 ENCOUNTER — Other Ambulatory Visit (HOSPITAL_COMMUNITY): Payer: Self-pay

## 2024-01-11 ENCOUNTER — Encounter: Payer: Self-pay | Admitting: Cardiology

## 2024-01-11 ENCOUNTER — Telehealth: Payer: Self-pay | Admitting: Cardiology

## 2024-01-11 VITALS — BP 124/62 | HR 75 | Ht 66.0 in | Wt 192.8 lb

## 2024-01-11 DIAGNOSIS — I4891 Unspecified atrial fibrillation: Secondary | ICD-10-CM

## 2024-01-11 DIAGNOSIS — I700 Atherosclerosis of aorta: Secondary | ICD-10-CM | POA: Diagnosis not present

## 2024-01-11 DIAGNOSIS — I350 Nonrheumatic aortic (valve) stenosis: Secondary | ICD-10-CM | POA: Diagnosis not present

## 2024-01-11 DIAGNOSIS — M79606 Pain in leg, unspecified: Secondary | ICD-10-CM | POA: Diagnosis not present

## 2024-01-11 DIAGNOSIS — E785 Hyperlipidemia, unspecified: Secondary | ICD-10-CM

## 2024-01-11 LAB — LIPID PANEL
Chol/HDL Ratio: 4.1 ratio (ref 0.0–4.4)
Cholesterol, Total: 212 mg/dL — ABNORMAL HIGH (ref 100–199)
HDL: 52 mg/dL (ref >39)
LDL Chol Calc (NIH): 143 mg/dL — ABNORMAL HIGH (ref 0–99)
Triglycerides: 95 mg/dL (ref 0–149)
VLDL Cholesterol Cal: 17 mg/dL (ref 5–40)

## 2024-01-11 MED ORDER — POTASSIUM CHLORIDE CRYS ER 20 MEQ PO TBCR: 20.0000 meq | EXTENDED_RELEASE_TABLET | ORAL | 3 refills | Status: AC | PRN

## 2024-01-11 MED ORDER — POTASSIUM CHLORIDE CRYS ER 20 MEQ PO TBCR
20.0000 meq | EXTENDED_RELEASE_TABLET | ORAL | 3 refills | Status: DC | PRN
  Filled 2024-01-11: qty 90, fill #0

## 2024-01-11 NOTE — Telephone Encounter (Signed)
 Called pt to f/u concern.  Pt reports was given clearance to have surgery for rectal prolapse today at [facility].  Pt is worried about treatment if has AF during or after surgery.  Reports HR would go up to 200 bpm previously. Pt tearful discussing concern.   Advised pt if has AF there is medications that can be used to help.  There is always cardiologist at the hospital that will be able to assist if needed.  Pt would like to know what the treatment plan will be should she go into AF.  Advised there are protocols in place for AF.  Pt would like MD input advised will send to be addressed.

## 2024-01-11 NOTE — Telephone Encounter (Signed)
 Additional - Patient noted she will be having rectal surgery to correct prolapsed rectum.

## 2024-01-11 NOTE — Patient Instructions (Signed)
 Medication Instructions:  Potassium 20 meq: Take when having diarrhea or when you take a 1/2 dose of Lasix  for swelling  *If you need a refill on your cardiac medications before your next appointment, please call your pharmacy*  Lab Work: Lipid panel today If you have labs (blood work) drawn today and your tests are completely normal, you will receive your results only by: MyChart Message (if you have MyChart) OR A paper copy in the mail If you have any lab test that is abnormal or we need to change your treatment, we will call you to review the results.  Testing/Procedures: NONE  Follow-Up: At [facility], you and your health needs are our priority.  As part of our continuing mission to provide you with exceptional heart care, our providers are all part of one team.  This team includes your primary Cardiologist (physician) and Advanced Practice Providers or APPs (Physician Assistants and Nurse Practitioners) who all work together to provide you with the care you need, when you need it.  Your next appointment:   4 months  Provider:   Lavonne Prairie, MD  We recommend signing up for the patient portal called MyChart.  Sign up information is provided on this After Visit Summary.  MyChart is used to connect with patients for Virtual Visits (Telemedicine).  Patients are able to view lab/test results, encounter notes, upcoming appointments, etc.  Non-urgent messages can be sent to your provider as well.   To learn more about what you can do with MyChart, go to [email or web address].

## 2024-01-11 NOTE — Telephone Encounter (Signed)
 Patient wants a call back to discuss next steps after she has surgery and will the surgery affect her afib.

## 2024-01-12 ENCOUNTER — Ambulatory Visit: Admitting: Gastroenterology

## 2024-01-12 ENCOUNTER — Ambulatory Visit: Payer: Self-pay | Admitting: Cardiology

## 2024-01-12 NOTE — Telephone Encounter (Signed)
 Called pt advised of MD response pt thanked me for calling.  Reports feels much better today.

## 2024-01-14 LAB — GASTROINTESTINAL PATHOGEN PNL
CampyloBacter Group: NOT DETECTED
Norovirus GI/GII: NOT DETECTED
Rotavirus A: NOT DETECTED
Salmonella species: NOT DETECTED
Shiga Toxin 1: NOT DETECTED
Shiga Toxin 2: NOT DETECTED
Shigella Species: NOT DETECTED
Vibrio Group: NOT DETECTED
Yersinia enterocolitica: NOT DETECTED

## 2024-01-15 ENCOUNTER — Telehealth: Payer: Self-pay | Admitting: Gastroenterology

## 2024-01-15 ENCOUNTER — Ambulatory Visit
Admission: RE | Admit: 2024-01-15 | Discharge: 2024-01-15 | Disposition: A | Discharge status: Home / Self Care | Source: Ambulatory Visit | Attending: Urology | Admitting: Urology

## 2024-01-15 ENCOUNTER — Ambulatory Visit: Payer: Self-pay | Admitting: Gastroenterology

## 2024-01-15 DIAGNOSIS — N281 Cyst of kidney, acquired: Secondary | ICD-10-CM

## 2024-01-15 DIAGNOSIS — R197 Diarrhea, unspecified: Secondary | ICD-10-CM

## 2024-01-15 MED ORDER — GADOPICLENOL 0.5 MMOL/ML IV SOLN
9.0000 mL | Freq: Once | INTRAVENOUS | Status: AC | PRN
  Administered 2024-01-15: 9 mL via INTRAVENOUS

## 2024-01-15 NOTE — Telephone Encounter (Signed)
 Inbound call from patient requesting a call to discuss next steps in plan of care since stool tests came back negative and after MRI today. Please advise, thank you.

## 2024-01-15 NOTE — Telephone Encounter (Signed)
 Spoke with pt regarding her results. Pt was very adamant that she does not want to take a statin as it causes her leg pain. Pt stated she had tried 5 mg of Crestor  before and it caused her leg pain. Pt is wondering if there are other options she could try. Pt was told this information would be sent to Dr. Lavona for review. Pt verbalized understanding. All questions if any were answered.

## 2024-01-15 NOTE — Telephone Encounter (Signed)
 Please see if patient would be willing to repeat a fecal calprotectin in 1-2 weeks, and schedule her for follow up with me or Dr. Avram in 1-2 months.

## 2024-01-15 NOTE — Telephone Encounter (Signed)
 Patient is requesting to speak with RN again. She says she previously said she wanted to try an injection to manage cholesterol, but she changed her mind. She would like a call back to discuss results.

## 2024-01-15 NOTE — Telephone Encounter (Signed)
 Spoke with the pt and made her aware that Dr Watt will call regarding the MRI.  We did not order.  She will continue metamucil- this has helped and stools are improving.  She will let us  know if she does not continue to improve.

## 2024-01-15 NOTE — Telephone Encounter (Signed)
-----   Message from Lynwood Schilling sent at 01/12/2024  1:12 PM EDT ----- Her LDL is not at target.  Does she think she can take 10 mg daily of the Crestor  and check it again in 3 months.  Call Ms. Estelle with the results and send results to Maree Leni Edyth DELENA, MD ----- Message ----- From: Rebecka Memos Lab Results In Sent: 01/11/2024  11:41 PM EDT To: Lynwood Schilling, MD

## 2024-01-16 ENCOUNTER — Encounter: Payer: Self-pay | Admitting: "Endocrinology

## 2024-01-16 ENCOUNTER — Ambulatory Visit (INDEPENDENT_AMBULATORY_CARE_PROVIDER_SITE_OTHER): Admitting: "Endocrinology

## 2024-01-16 VITALS — BP 122/80 | HR 68 | Ht 66.0 in | Wt 191.0 lb

## 2024-01-16 DIAGNOSIS — E042 Nontoxic multinodular goiter: Secondary | ICD-10-CM

## 2024-01-16 LAB — TSH: TSH: 1.17 m[IU]/L (ref 0.40–4.50)

## 2024-01-16 LAB — T4, FREE: Free T4: 1.4 ng/dL (ref 0.8–1.8)

## 2024-01-16 NOTE — Telephone Encounter (Signed)
 Spoke with pt regarding her questions about injectable cholesterol medications. Pt stated she had done some research and would like to stay away from them and try a different pill other than a statin. Pt was told that the doctor has not suggested an injectable cholesterol medication at this time and that we will reach out with Dr. Denver suggestions. Pt verbalized understanding. All questions if any were answered.

## 2024-01-16 NOTE — Telephone Encounter (Signed)
-----   Message from Nurse Hamp HERO sent at 01/15/2024  5:09 PM EDT -----  ----- Message ----- From: Jerel Francina BRAVO Sent: 01/15/2024   4:59 PM EDT To: Lurena [facility] Triage  ----- Message from Francina BRAVO Jerel sent at 01/15/2024  4:59 PM EDT -----

## 2024-01-16 NOTE — Progress Notes (Signed)
 Outpatient Endocrinology Note Felicia Birmingham, MD  01/16/24   Felicia Odom [birth year] [id number]  Referring Provider: Leontine Cramp, NP Primary Care Provider: Maree Leni Edyth DELENA, MD Subjective  No chief complaint on file.   Assessment & Plan  Diagnoses and all orders for this visit:  Multiple thyroid  nodules -     TSH -     T4, free -     US  THYROID ; Future -     US  THYROID     Felicia Odom is currently not taking any thyroid  medication. Never been on any thyroid  medication. Patient is currently biochemically euthyroid on last check years ago.  Educated on thyroid  axis.  Recommend the following: labs today. Repeat lab before next visit or sooner if symptoms of hyperthyroidism or hypothyroidism develop.  Notify us  immediately in case of significant weight gain or loss. Counseled on compliance and follow up needs.  07/13/22 THYROID  ULTRASOUND reviewed images and report; reported stable to incrementally enlarged nodule in the right mid thyroid  gland which was previously biopsied in 2017. The nodule measures 3.2 x 2.2 x 2.1 cm today which is essentially unchanged compared to 3.0 x 2.1 x 2.0 cm previously. 11/18/2015 THYROID , FINE NEEDLE ASPIRATION RIGHT MIDDLE POLE (SPECIMEN 1 OF 1, COLLECTED ON 11/18/2015): CONSISTENT WITH BENIGN FOLLICULAR NODULE (Felicia Odom CATEGORY II).  Ordered f/u thyroid  U/S   I have reviewed current medications, nurse's notes, allergies, vital signs, past medical and surgical history, family medical history, and social history for this encounter. Counseled patient on symptoms, examination findings, lab findings, imaging results, treatment decisions and monitoring and prognosis. The patient understood the recommendations and agrees with the treatment plan. All questions regarding treatment plan were fully answered.   Return in about 1 year (around 01/15/2025) for visit, labs today.   Felicia Birmingham, MD  01/16/24   I have reviewed  current medications, nurse's notes, allergies, vital signs, past medical and surgical history, family medical history, and social history for this encounter. Counseled patient on symptoms, examination findings, lab findings, imaging results, treatment decisions and monitoring and prognosis. The patient understood the recommendations and agrees with the treatment plan. All questions regarding treatment plan were fully answered.   History of Present Illness Felicia Odom is a 68 y.o. year old female who presents to our clinic with thyroid  nodule diagnosed in 2022.    Never been on any thyroid  medication   Symptoms suggestive of HYPOTHYROIDISM:  fatigue No weight gain No cold intolerance  Yes, on eliquis  for A.fibrillation  constipation  No  Symptoms suggestive of HYPERTHYROIDISM:  weight loss  Yes, gradual in past  heat intolerance No hyperdefecation  Yes palpitations  No, unless excited   Compressive symptoms:  dysphagia  No dysphonia  No positional dyspnea (especially with simultaneous arms elevation)  No  Smokes  No On biotin  No Personal history of head/neck surgery/irradiation  No  07/13/22 THYROID  ULTRASOUND   TECHNIQUE: Ultrasound examination of the thyroid  gland and adjacent soft tissues was performed.   COMPARISON:  Prior thyroid  ultrasound 08/15/2022   FINDINGS: Parenchymal Echotexture: Mildly heterogenous   Isthmus: 0.5 cm   Right lobe: 4.9 x 2.2 x 2.3 cm   Left lobe: 4.6 x 0.9 x 1.6 cm   _________________________________________________________   Estimated total number of nodules >/= 1 cm: 2   Number of spongiform nodules >/=  2 cm not described below (TR1): 0   Number of mixed cystic and solid nodules >/= 1.5 cm not described below (  TR2): 0   _________________________________________________________   Nodule # 1: Previously biopsied isoechoic solid nodule in the right mid gland maintains continued stability. The nodule measures 3.2 x 2.2 x  2.1 cm today which is essentially unchanged compared to 3.0 x 2.1 x 2.0 cm previously.   Nodule # 2: Small isoechoic solid nodule in the left inferior gland does not meet criteria to warrant further evaluation.   IMPRESSION: 1. Stable to incrementally enlarged nodule in the right mid thyroid  gland which was previously biopsied in 2017. 2. No new nodules or suspicious features.  11/18/2015  A 2.5 cm right thyroid  nodule which was initially seen and was hypermetabolic on PET scan. Request for ultrasound guided fine needle aspirate biopsy. Diagnosis THYROID , FINE NEEDLE ASPIRATION RIGHT MIDDLE POLE (SPECIMEN 1 OF 1, COLLECTED ON 11/18/2015): CONSISTENT WITH BENIGN FOLLICULAR NODULE (Felicia Odom CATEGORY II).   Physical Exam  BP 122/80   Pulse 68   Ht 5' 6 (1.676 m)   Wt 191 lb (86.6 kg)   SpO2 98%   BMI 30.83 kg/m  Constitutional: well developed, well nourished Head: normocephalic, atraumatic, no exophthalmos Eyes: sclera anicteric, no redness Neck: no thyromegaly, no thyroid  tenderness; + nodule palpated Lungs: normal respiratory effort Neurology: alert and oriented, no fine hand tremor Skin: dry, no appreciable rashes Musculoskeletal: no appreciable defects Psychiatric: normal mood and affect  Allergies Allergies  Allergen Reactions   Sulfa Antibiotics Hives and Swelling    Current Medications Patient's Medications  New Prescriptions   No medications on file  Previous Medications   ACETAMINOPHEN  (TYLENOL ) 500 MG TABLET    Take 1,000 mg by mouth every 6 (six) hours as needed for fever or headache.   ALBUTEROL  (PROVENTIL ) (2.5 MG/3ML) 0.083% NEBULIZER SOLUTION    USE 1 VIAL IN NEBULIZER EVERY 6 HOURS - And As Needed   ALBUTEROL  (VENTOLIN  HFA) 108 (90 BASE) MCG/ACT INHALER    INHALE 1 TO 2 PUFFS INTO THE LUNGS EVERY 4 HOURS AS NEEDED FOR WHEEZING OR SHORTNESS OF BREATH   ALPRAZOLAM  (XANAX ) 1 MG TABLET    TAKE 1 TABLET (1 MG TOTAL) BY MOUTH EVERY 6 (SIX) HOURS AS NEEDED.  FOR ANXIETY   APIXABAN  (ELIQUIS ) 5 MG TABS TABLET    Take 1 tablet by mouth twice daily   DICYCLOMINE  (BENTYL ) 20 MG TABLET    Take 1 tablet (20 mg total) by mouth every 6 (six) hours as needed for spasms.   ESOMEPRAZOLE  (NEXIUM ) 20 MG CAPSULE    Take 20 mg by mouth daily as needed (acid reflux).   FLUOCINONIDE (LIDEX) 0.05 % EXTERNAL SOLUTION    Apply 1 Application topically daily as needed.   FUROSEMIDE  (LASIX ) 40 MG TABLET    Take 1 tablet by mouth twice daily   MULTIPLE VITAMIN (MULTIVITAMIN) TABLET    Take 1 tablet by mouth daily.   NYSTATIN  CREAM (MYCOSTATIN )    Apply 1 Application topically 2 (two) times daily.   POTASSIUM CHLORIDE  SA (KLOR-CON  M) 20 MEQ TABLET    Take 1 tablet (20 mEq total) by mouth as needed (Take as needed when having diarrhea or when you take 20 mg of Lasix ).   RIFAXIMIN  (XIFAXAN ) 550 MG TABS TABLET    Take 550 mg by mouth 3 (three) times daily.   ROSUVASTATIN  (CRESTOR ) 5 MG TABLET    Take 1 tablet (5 mg total) by mouth every other day.   VALACYCLOVIR (VALTREX) 1000 MG TABLET    as needed.   VITAMIN D , ERGOCALCIFEROL , (DRISDOL ) 1.25 MG (  50000 UNIT) CAPS CAPSULE    Take 50,000 Units by mouth every 7 (seven) days.  Modified Medications   No medications on file  Discontinued Medications   No medications on file    Past Medical History Past Medical History:  Diagnosis Date   Anxiety    Aortic atherosclerosis (HCC) 10/06/2021   Arthritis    neck   Cannabis dependence with current use (HCC) 07/26/2016   Chronic back pain    Chronic bronchitis (HCC)    Chronic diastolic CHF (congestive heart failure) (HCC)    Chronic neck pain    Cocaine abuse (HCC) 04/05/2021   COPD mixed type (HCC) 02/18/2019   Office Spirometry 10/21/2015-slight restriction of exhaled volume, mild obstruction. FVC 2.69/78%, FEV1 2.04/76%, FEV1/FVC 0.76, FEF 25-75 percent 1.68/65% PFT 04/27/2016-minimal obstructive airways disease, minimal diffusion defect, insignificant response to  bronchodilator. FVC 2.79/78%, FEV1 2.11/76%, ratio 0.76, FEF 25-75 percent 1.81/72%, TLC 92%, DLCO 76%.   DDD (degenerative disc disease), cervical 06/15/2015   Diverticulosis    Eczema 11/29/2017   Encounter for long-term opiate analgesic use 07/06/2017   Essential hypertension 09/25/2015   GERD (gastroesophageal reflux disease)    takes Nexium  daily   Helicobacter pylori gastritis 08/2019   Tx and eradicated   Hyperlipidemia    not on any meds bc refused to pick up from pharmacy   Long-term current use of opiate analgesic 04/05/2021   Moderate episode of recurrent major depressive disorder (HCC) 01/28/2020   Obesity (BMI 30.0-34.9) 12/16/2021   Obsessive-compulsive disorder 06/08/2007   Qualifier: Diagnosis of  By: Tanda Milling     PAF (paroxysmal atrial fibrillation) (HCC) 12/16/2021   PANIC DISORDER 06/08/2007   Qualifier: Diagnosis of  By: Tanda Milling     Pneumonia    2016   PONV (postoperative nausea and vomiting)    Primary osteoarthritis involving multiple joints 01/21/2015   Seborrheic dermatitis of scalp 02/11/2015   Seizures (HCC)    in the 80's   Thyroid  nodule 07/30/2020   IMPRESSION: No significant interval change of bilateral thyroid  nodules.   The above is in keeping with the ACR TI-RADS recommendations - J Am Coll Radiol 2017;14:587-595.     Electronically Signed   By: Aliene Lloyd M.D.   On: 08/17/2020 14:18   Type II diabetes mellitus with manifestations (HCC) 02/18/2019   The 10-year ASCVD risk score Verdon DC Mickey., et al., 2013) is: 9.1%   Values used to calculate the score:     Age: 25 years     Sex: Female     Is Non-Hispanic African American: No     Diabetic: Yes     Tobacco smoker: No     Systolic Blood Pressure: 138 mmHg     Is BP treated: No     HDL Cholesterol: 46.2 mg/dL     Total Cholesterol: 180 mg/dL    Past Surgical History Past Surgical History:  Procedure Laterality Date   ATRIAL FIBRILLATION ABLATION N/A 09/08/2022   Procedure: ATRIAL  FIBRILLATION ABLATION;  Surgeon: Inocencio Soyla Lunger, MD;  Location: [facility];  Service: Cardiovascular;  Laterality: N/A;   COLONOSCOPY     ECTOPIC PREGNANCY SURGERY     x 2    ESOPHAGOGASTRODUODENOSCOPY     LAPAROSCOPIC LYSIS INTESTINAL ADHESIONS     x 2   RESECTION OF MEDIASTINAL MASS N/A 06/10/2016   Procedure: RESECTION OF MEDIASTINAL MASS;  Surgeon: Dallas KATHEE Jude, MD;  Location: [facility] OR;  Service: Thoracic;  Laterality: N/A;  VIDEO ASSISTED THORACOSCOPY Right 06/10/2016   Procedure: VIDEO ASSISTED THORACOSCOPY WITH PLACEMENT OF ON Q TUNNELER;  Surgeon: Dallas KATHEE Jude, MD;  Location: [facility] OR;  Service: Thoracic;  Laterality: Right;   VIDEO BRONCHOSCOPY N/A 06/10/2016   Procedure: VIDEO BRONCHOSCOPY;  Surgeon: Dallas KATHEE Jude, MD;  Location: [facility] OR;  Service: Thoracic;  Laterality: N/A;    Family History family history includes Breast cancer in her sister; Clotting disorder in her mother; Colon cancer in her sister; Diabetes in her mother; Heart attack in her father; Heart failure in her father and mother; Kidney disease in her mother; Prostate cancer in her father.  Social History Social History   Socioeconomic History   Marital status: Divorced    Spouse name: Not on file   Number of children: 1   Years of education: Not on file   Highest education level: Not on file  Occupational History   Occupation: retired  Tobacco Use   Smoking status: Former    Current packs/day: 0.00    Average packs/day: 1 pack/day for 36.0 years (36.0 ttl pk-yrs)    Types: Cigarettes    Start date: 24    Quit date: 2017    Years since quitting: 8.4   Smokeless tobacco: Never   Tobacco comments:    Former smoker last cigarette 2017 04/15/22  Vaping Use   Vaping status: Never Used  Substance and Sexual Activity   Alcohol use: No    Alcohol/week: 0.0 standard drinks of alcohol   Drug use: No   Sexual activity: Not Currently    Birth control/protection: None, Post-menopausal   Other Topics Concern   Not on file  Social History Narrative   Retired [employer]   Divorced   1 son   Former smoker   2 coffee/daty   No EtOH, no drugs   Social Drivers of Corporate investment banker Strain: Not on file  Food Insecurity: No Food Insecurity (12/14/2023)   Hunger Vital Sign    Worried About Running Out of Food in the Last Year: Never true    Ran Out of Food in the Last Year: Never true  Transportation Needs: No Transportation Needs (12/14/2023)   PRAPARE - Administrator, Civil Service (Medical): No    Lack of Transportation (Non-Medical): No  Physical Activity: Not on file  Stress: Not on file  Social Connections: Moderately Integrated (12/14/2023)   Social Connection and Isolation Panel    Frequency of Communication with Friends and Family: Three times a week    Frequency of Social Gatherings with Friends and Family: Three times a week    Attends Religious Services: More than 4 times per year    Active Member of Clubs or Organizations: No    Attends Banker Meetings: Never    Marital Status: Married  Catering manager Violence: Not At Risk (12/14/2023)   Humiliation, Afraid, Rape, and Kick questionnaire    Fear of Current or Ex-Partner: No    Emotionally Abused: No    Physically Abused: No    Sexually Abused: No    Laboratory Investigations Lab Results  Component Value Date   TSH 1.890 08/03/2021   TSH 1.98 10/28/2020   TSH 2.10 08/08/2019     No results found for: TSI   No components found for: TRAB   Lab Results  Component Value Date   CHOL 212 (H) 01/11/2024   Lab Results  Component Value Date   HDL 52 01/11/2024  Lab Results  Component Value Date   LDLCALC 143 (H) 01/11/2024   Lab Results  Component Value Date   TRIG 95 01/11/2024   Lab Results  Component Value Date   CHOLHDL 4.1 01/11/2024   Lab Results  Component Value Date   CREATININE 0.73 01/09/2024   Lab Results  Component Value Date    GFR 85.21 01/09/2024      Component Value Date/Time   NA 137 01/09/2024 1628   NA 140 06/29/2023 0836   K 3.6 01/09/2024 1628   CL 102 01/09/2024 1628   CO2 29 01/09/2024 1628   GLUCOSE 102 (H) 01/09/2024 1628   BUN 9 01/09/2024 1628   BUN 10 06/29/2023 0836   CREATININE 0.73 01/09/2024 1628   CALCIUM  9.2 01/09/2024 1628   PROT 7.7 01/09/2024 1628   PROT 6.9 06/29/2023 0836   ALBUMIN 4.4 01/09/2024 1628   ALBUMIN 4.2 06/29/2023 0836   AST 17 01/09/2024 1628   ALT 15 01/09/2024 1628   ALKPHOS 61 01/09/2024 1628   BILITOT 0.6 01/09/2024 1628   BILITOT 0.4 06/29/2023 0836   GFRNONAA >60 12/15/2023 0449   GFRAA >60 06/12/2016 0343      Latest Ref Rng & Units 01/09/2024    4:28 PM 12/15/2023    4:49 AM 12/14/2023    5:39 AM  BMP  Glucose 70 - 99 mg/dL 897  886  86   BUN 6 - 23 mg/dL 9  14  15    Creatinine 0.40 - 1.20 mg/dL 9.26  9.29  9.50   Sodium 135 - 145 mEq/L 137  139  138   Potassium 3.5 - 5.1 mEq/L 3.6  3.4  3.5   Chloride 96 - 112 mEq/L 102  109  105   CO2 19 - 32 mEq/L 29  26  25    Calcium  8.4 - 10.5 mg/dL 9.2  8.4  8.6        Component Value Date/Time   WBC 8.0 01/09/2024 1628   RBC 5.24 (H) 01/09/2024 1628   HGB 14.6 01/09/2024 1628   HGB 15.2 06/29/2023 0836   HCT 44.3 01/09/2024 1628   HCT 47.5 (H) 06/29/2023 0836   PLT 322.0 01/09/2024 1628   PLT 292 06/29/2023 0836   MCV 84.4 01/09/2024 1628   MCV 90 06/29/2023 0836   MCH 28.9 12/15/2023 0449   MCHC 33.0 01/09/2024 1628   RDW 14.5 01/09/2024 1628   RDW 13.5 06/29/2023 0836   LYMPHSABS 2.2 01/09/2024 1628   LYMPHSABS 2.0 08/25/2022 0911   MONOABS 0.8 01/09/2024 1628   EOSABS 0.1 01/09/2024 1628   EOSABS 0.2 08/25/2022 0911   BASOSABS 0.1 01/09/2024 1628   BASOSABS 0.1 08/25/2022 0911      Parts of this note may have been dictated using voice recognition software. There may be variances in spelling and vocabulary which are unintentional. Not all errors are proofread. Please notify the dino  if any discrepancies are noted or if the meaning of any statement is not clear.

## 2024-01-16 NOTE — Telephone Encounter (Signed)
 Called and spoke with patient. She is in agreement with coming in for repeat calprotectin test. Scheduled f/u appointment with Dr. Avram per patient request for 03/19/2024 at 0830.

## 2024-01-22 ENCOUNTER — Other Ambulatory Visit (HOSPITAL_COMMUNITY): Payer: Self-pay

## 2024-01-22 ENCOUNTER — Telehealth: Payer: Self-pay | Admitting: Pharmacy Technician

## 2024-01-22 MED ORDER — BEMPEDOIC ACID 180 MG PO TABS: 180.0000 mg | ORAL_TABLET | Freq: Every day | ORAL | 3 refills | Status: DC

## 2024-01-22 NOTE — Telephone Encounter (Signed)
 Spoke with pt regarding her medication options. Pt opted to try Bempedoic acid. Prescription ordered. Pt aware. Pt verbalized understanding. All questions if any were answered.

## 2024-01-22 NOTE — Telephone Encounter (Signed)
 Pharmacy Patient Advocate Encounter   Received notification from [employer]-Schlanger, Aleck SAILOR, RN that prior authorization for Nexletol 180MG  tablets is required/requested.   Insurance verification completed.   The patient is insured through [facility] .   Per test claim: PA required; PA submitted to above mentioned insurance via CoverMyMeds Key/confirmation #/EOC [id number] Status is pending

## 2024-01-22 NOTE — Telephone Encounter (Signed)
-----   Message from Nurse Hamp HERO sent at 01/15/2024  5:09 PM EDT -----  ----- Message ----- From: Jerel Francina BRAVO Sent: 01/15/2024   4:59 PM EDT To: Lurena [facility] Triage  ----- Message from Francina BRAVO Jerel sent at 01/15/2024  4:59 PM EDT -----

## 2024-01-23 NOTE — Telephone Encounter (Signed)
 Pharmacy Patient Advocate Encounter  Received notification from [employer] that Prior Authorization for nexletol has been APPROVED from 01/22/24 to 07/23/24. Spoke to pharmacy to process.Copay is $0.00.    PA #/Case ID/Reference #: [id number]

## 2024-01-24 ENCOUNTER — Other Ambulatory Visit

## 2024-01-24 DIAGNOSIS — R197 Diarrhea, unspecified: Secondary | ICD-10-CM

## 2024-01-25 ENCOUNTER — Ambulatory Visit (HOSPITAL_COMMUNITY)
Admission: RE | Admit: 2024-01-25 | Discharge: 2024-01-25 | Disposition: A | Discharge status: Home / Self Care | Source: Ambulatory Visit | Attending: "Endocrinology | Admitting: "Endocrinology

## 2024-01-25 DIAGNOSIS — E042 Nontoxic multinodular goiter: Secondary | ICD-10-CM | POA: Insufficient documentation

## 2024-01-31 LAB — CALPROTECTIN: Calprotectin: 35 ug/g

## 2024-02-01 ENCOUNTER — Ambulatory Visit: Payer: Self-pay | Admitting: Gastroenterology

## 2024-02-06 ENCOUNTER — Ambulatory Visit: Admitting: Cardiology

## 2024-02-10 NOTE — Progress Notes (Unsigned)
 Patient ID: Felicia Odom, female    DOB: [birth year], 68 y.o.   MRN: [id number]  HPI F former smoker followed for COPD, Allergic rhinitis, complicated by Thymoma/ resected, anxiety Office spirometry- 01/29/15 Normal spirometry. FVC 2.74/81%, FEV1 2.11/79%, FEV1/FVC 0.77, FEF 25-75 percent 1.79 Office Spirometry 10/21/2015-slight restriction of exhaled volume, mild obstruction. FVC 2.69/78%, FEV1 2.04/76%, FEV1/FVC 0.76, FEF 25-75 percent 1.68/65% PFT 04/27/2016-minimal obstructive airways disease, minimal diffusion defect, insignificant response to bronchodilator. FVC 2.79/78%, FEV1 2.11/76%, ratio 0.76, FEF 25-75 percent 1.81/72%, TLC 92%, DLCO 76%. .----------------------------------------------------------------------------------------------------    11/13/23-68 yoF former smoker (36 pk yrs) followed for Asthma/Bronchitis/ COPD, allergic Rhinitis, complicated by Resection Thymoma/ TSGY, OCD/panic, Anxiety/ Depression, GERD, DM2, HTN, Eczema, Osteoarthritis, Hyperlipidemia, AFib/ Eliquis , dCHF, Aortic Atherosclerosis, -Neb albuterol  , Ventolin  hfa, Xanax  1 mg, Pending CT low dose screen- Discussed the use of AI scribe software for clinical note transcription with the patient, who gave verbal consent to proceed.  History of Present Illness   The patient presents for a routine check-up and medication refill. She reports significant improvement since her cardiac ablation, with fewer visits to the doctor. She also mentions a previous diagnosis of S A P S, a gastrointestinal condition, which had been initially misdiagnosed as IBS. The patient reports that her symptoms had subsided for a while but are now returning, prompting her to seek further consultation with her gastroenterologist.  The patient's respiratory health appears to be stable, with the use of a nebulizer machine with albuterol  and a Ventolin  rescue inhaler only required occasionally. She notes that her breathing is typically  more challenged in the winter. Low dose screening CT f/u  lung nodule is scheduled.  The patient also takes Xanax  for anxiety, which she reports as helpful. She takes it three to four times a day, but usually not as frequently as prescribed. She attributes her improved mental health to the resolution of her heart issues and her personal faith.  The patient also mentions a recent weight gain, which she attributes to her health issues. She expresses a commitment to working on her health.     Assessment and Plan:    Pulmonary health maintenance Breathing improved post-heart ablation. Uses nebulizer with albuterol  and Ventolin  as needed. Home oxygen saturation stable at 97-98%. - Continue nebulizer with albuterol  and Ventolin  rescue inhaler as needed. - Schedule follow-up in 3 months for pulmonary assessment.  Irritable Bowel Syndrome (IBS) IBS diagnosed by Dr. Avram. Improvement noted post-heart ablation. Further evaluation needed. - Follow up with Dr. Avram for gastrointestinal evaluation and management.  Anxiety Xanax  effective for anxiety, taken less than prescribed. Mental state improved post-heart issues. Spiritual practices beneficial. - Continue Xanax  for anxiety management.   CT chest low dose screen-09/04/23 IMPRESSION: Lung-RADS 1, negative. Continue annual screening with low-dose chest CT without contrast in 12 months. Aortic Atherosclerosis (ICD10-I70.0) and Emphysema (ICD10-J43.9). IMPRESSION: Lung-RADS 1, negative. Continue annual screening with low-dose chest CT without contrast in 12 months. Aortic Atherosclerosis (ICD10-I70.0) and Emphysema (ICD10-J43.9).  02/13/24- 62 yoF former smoker (36 pk yrs/tobacco/cannabis) followed for Asthma/Bronchitis/ COPD, allergic Rhinitis, complicated by Resection Thymoma/ TSGY, OCD/panic, Anxiety/ Depression, GERD, DM2, HTN, Eczema, Osteoarthritis, Hyperlipidemia, AFib/ Eliquis , dCHF, Aortic Atherosclerosis, Thyroid  Nodules,,  -Neb  albuterol  , Ventolin  hfa, Xanax  1 mg, Low dose screening CT due in Feb, 2026. Discussed the use of AI scribe software for clinical note transcription with the patient, who gave verbal consent to proceed.  History of Present Illness   Felicia Odom is a 68  year old female with asthmatic bronchitis/ mild COPD and chronic anxiety who presents for follow-up care.  She has not needed to use her inhalers recently, despite the rainy weather. She occasionally experiences a small amount of phlegm when coughing, which is not colored. No breathing difficulties or need for inhalers recently. No swollen glands. Maintenance xanax  has helped minimize ER vists/ exacerbations.     Assessment and Plan:    Asthmatic bronchitis (COPD) Mild asthmatic bronchitis, stable with no recent exacerbations. Occasional phlegm production noted. - Continue current inhalers as needed. - Use breathing machine if symptoms worsen.  Fatty liver Diagnosed with fatty liver, asymptomatic. Efficacy of milk thistle not confirmed.  Hyperlipidemia Intolerance to Crestor  due to muscle pain. Currently on alternative medication for cholesterol management. Cholesterol level high at 240.  Thyroid  nodules Two thyroid  nodules present, stable with no new symptoms.  Anxiety Anxiety managed with Xanax . Refill process complicated by triage questioning. - Continue Xanax  for anxiety management. - Address refill process concerns with the triage team.     Review of Systems- see HPI + = positive Constitutional:  + weight loss, +night sweats, fevers, chills, fatigue, lassitude. HEENT:   No-  headaches, difficulty swallowing, tooth/dental problems, sore throat,       No-  sneezing, itching, ear ache, nasal congestion, post nasal drip,  CV:   chest pain, No-orthopnea, PND, swelling in lower extremities, anasarca, dizziness, palpitations Resp: + shortness of breath with exertion or at rest.              productive cough,    non-productive cough,  No- coughing up of blood.            change in color of mucus.  No- wheezing.   Skin: No-   rash or lesions. GI:    heartburn, indigestion, No-abdominal pain, nausea, vomiting,  GU:  MS:  + joint pain or swelling.  . Neuro-     nothing unusual Psych: change in mood or affect. +Chronic depression , + anxiety.  No memory loss.   Objective:   Physical Exam General- Alert, Oriented , Distress- none acute, + Obese.  Skin- rash-none, lesions- none, excoriation- none Lymphadenopathy- none Head- + healed scar forehead            Eyes- Gross vision intact, PERRLA, conjunctivae clear secretions            Ears- Hearing, canals-normal            Nose- No- rhinorrhea, no-Septal dev, polyps, erosion, perforation             Throat- Mallampati II , mucosa clear , drainage- none, tonsils- atrophic. +Missing teeth Neck- flexible , trachea midline, no stridor , thyroid  nl, carotid no bruit Chest - symmetrical excursion , unlabored           Heart/CV- RRR ,  murmur+1S , no gallop, no rub, nl s1 s2                           - JVD- none , edema- none, stasis changes- none, varices- none           Lung- wheeze-none,  unlabored, cough-none, dullness-none, rub- none,            Chest wall-  +R VATS scars s/p thymectomy. No rub. Abd-  Br/ Gen/ Rectal- Not done, not indicated Extrem-  Neuro- grossly intact to observation

## 2024-02-13 ENCOUNTER — Ambulatory Visit (INDEPENDENT_AMBULATORY_CARE_PROVIDER_SITE_OTHER): Admitting: Internal Medicine

## 2024-02-13 ENCOUNTER — Encounter: Payer: Self-pay | Admitting: Internal Medicine

## 2024-02-13 VITALS — BP 113/64 | HR 77 | Temp 97.7°F | Resp 18 | Ht 66.0 in | Wt 192.4 lb

## 2024-02-13 DIAGNOSIS — J410 Simple chronic bronchitis: Secondary | ICD-10-CM | POA: Diagnosis not present

## 2024-02-13 DIAGNOSIS — Z87891 Personal history of nicotine dependence: Secondary | ICD-10-CM

## 2024-02-13 NOTE — Patient Instructions (Signed)
 Really glad you are feeling better  Make sure you don't run out of your inhalers- you may need them again this winter, or if you catch a cold.  Please call if we can help.

## 2024-02-19 ENCOUNTER — Other Ambulatory Visit: Payer: Self-pay | Admitting: Cardiology

## 2024-02-19 DIAGNOSIS — I480 Paroxysmal atrial fibrillation: Secondary | ICD-10-CM

## 2024-02-20 NOTE — Telephone Encounter (Signed)
 Prescription refill request for Eliquis  received. Indication:afib Last office visit:6/25 Scr:0.73  6/25 Age: 68 Weight:87.3  kg  Prescription refilled

## 2024-03-07 ENCOUNTER — Telehealth: Payer: Self-pay

## 2024-03-07 ENCOUNTER — Other Ambulatory Visit: Payer: Self-pay | Admitting: Internal Medicine

## 2024-03-07 NOTE — Telephone Encounter (Signed)
 Copied from CRM #[id number]. Topic: Clinical - Medication Question >> Mar 06, 2024  4:15 PM Isabell A wrote: Reason for CRM: Patient is requesting a prescription for esomeprazole  (NEXIUM ) 20 MG capsule.   Pharmacy [facility] [address] [place] - [address] [place] [address] Phone: [phone]  Fax: [phone]   Callback number: [phone] >> Mar 07, 2024  2:09 PM Isabell A wrote: Patient calling to check if the medication has been filled.   Called patient,informed patient dr young is out until next week patient is okay with waiting until Monday for him to advise on this,patient is requesting refill our office does not fill this please advise if okay

## 2024-03-12 ENCOUNTER — Telehealth: Payer: Self-pay

## 2024-03-12 ENCOUNTER — Other Ambulatory Visit: Payer: Self-pay | Admitting: Internal Medicine

## 2024-03-12 MED ORDER — ESOMEPRAZOLE MAGNESIUM 20 MG PO CPDR: 20.0000 mg | DELAYED_RELEASE_CAPSULE | Freq: Every day | ORAL | 5 refills | Status: DC | PRN

## 2024-03-12 NOTE — Telephone Encounter (Signed)
 Nexium  refill ordered

## 2024-03-12 NOTE — Telephone Encounter (Signed)
 Copied from CRM #[id number]. Topic: Clinical - Medication Question >> Mar 12, 2024  8:34 AM Benton KIDD wrote: Patient is calling back about a refill for the nexium  . Patient is requesting a refill . Still waiting for dr young . They did say by Monday and she still has not heard anything . Please advise or refill prescription for patient . Please reach out to patient [phone]  Patient verbalized understanding rx at pharmacy  Thank you  - NFN

## 2024-03-13 ENCOUNTER — Ambulatory Visit: Payer: Self-pay | Admitting: "Endocrinology

## 2024-03-14 ENCOUNTER — Encounter: Payer: Self-pay | Admitting: "Endocrinology

## 2024-03-14 ENCOUNTER — Telehealth (INDEPENDENT_AMBULATORY_CARE_PROVIDER_SITE_OTHER): Admitting: "Endocrinology

## 2024-03-14 VITALS — Ht 66.0 in | Wt 192.0 lb

## 2024-03-14 DIAGNOSIS — E042 Nontoxic multinodular goiter: Secondary | ICD-10-CM | POA: Diagnosis not present

## 2024-03-14 NOTE — Progress Notes (Signed)
 The patient reports they are currently: Felicia Odom. I spent 6-7 minutes on the video with the patient on the date of service. I spent an additional 10 minutes on pre- and post-visit activities on the date of service.   The patient was physically located in [place]  or a state in which I am permitted to provide care. The patient and/or parent/guardian understood that s/he may incur co-pays and cost sharing, and agreed to the telemedicine visit. The visit was reasonable and appropriate under the circumstances given the patient's presentation at the time.  The patient and/or parent/guardian has been advised of the potential risks and limitations of this mode of treatment (including, but not limited to, the absence of in-person examination) and has agreed to be treated using telemedicine. The patient's/patient's family's questions regarding telemedicine have been answered.   The patient and/or parent/guardian has also been advised to contact their provider's office for worsening conditions, and seek emergency medical treatment and/or call 911 if the patient deems either necessary.     Outpatient Endocrinology Note Felicia Birmingham, MD  03/14/24   Felicia Odom Oct 22, 1964 [id number]  Referring Provider: Maree Leni Edyth DELENA, MD Primary Care Provider: Maree Leni Edyth DELENA, MD Subjective  No chief complaint on file.   Assessment & Plan  Diagnoses and all orders for this visit:  Multiple thyroid  nodules -     US  FNA BX THYROID  1ST LESION AFIRMA; Future   MERIAN WROE is currently not taking any thyroid  medication. Never been on any thyroid  medication. Patient is currently biochemically euthyroid.  Educated on thyroid  axis.  Recommend the following: annual TSH with reflex FT4. Repeat lab before next visit or sooner if symptoms of hyperthyroidism or hypothyroidism develop.  Notify us  immediately in case of significant weight gain or loss. Counseled on compliance and follow up  needs.  07/13/22 THYROID  ULTRASOUND reviewed images and report; reported stable to incrementally enlarged nodule in the right mid thyroid  gland which was previously biopsied in 2017. The nodule measures 3.2 x 2.2 x 2.1 cm today which is essentially unchanged compared to 3.0 x 2.1 x 2.0 cm previously. 11/18/2015 THYROID , FINE NEEDLE ASPIRATION RIGHT MIDDLE POLE (SPECIMEN 1 OF 1, COLLECTED ON 11/18/2015): CONSISTENT WITH BENIGN FOLLICULAR NODULE (BETHESDA CATEGORY II).  01/25/24: Thyroid  U/S: Nodule 1: Previously biopsied RIGHT inferior thyroid  nodule is unchanged in size measuring 2.9 x 1.8 x 2.3 cm on the current exam compared to 3.2 x 2.2 x 2.1 cm on the prior study. Please correlate with prior FNA results from 11/18/2015.  Nodule 2: 1.0 x 0.7 x 1.0 cm solid isoechoic LEFT inferior thyroid  nodule (TI-RADS 3) is not significantly changed in size since prior examination and does not meet criteria for FNA or imaging follow-up. 03/14/24: Ordered FNA: Nodule 1: Previously biopsied being RIGHT inferior thyroid  nodule is 2.9 x 1.8 x 2.3 cm (grown by 67% compared to 2017 FNA). Reordered FNA, patient prefers FNA over monitoring   I have reviewed current medications, nurse's notes, allergies, vital signs, past medical and surgical history, family medical history, and social history for this encounter. Counseled patient on symptoms, examination findings, lab findings, imaging results, treatment decisions and monitoring and prognosis. The patient understood the recommendations and agrees with the treatment plan. All questions regarding treatment plan were fully answered.   Return in about 1 year (around 03/14/2025).   Felicia Birmingham, MD  03/14/24   I have reviewed current medications, nurse's notes, allergies, vital signs, past medical and surgical history, family medical history,  and social history for this encounter. Counseled patient on symptoms, examination findings, lab findings, imaging results, treatment  decisions and monitoring and prognosis. The patient understood the recommendations and agrees with the treatment plan. All questions regarding treatment plan were fully answered.   History of Present Illness Felicia Odom is a 68 y.o. year old female who presents to our clinic with thyroid  nodule diagnosed in 2022.    Never been on any thyroid  medication   Symptoms suggestive of HYPOTHYROIDISM:  fatigue No weight gain No cold intolerance  Yes, on eliquis  for A.fibrillation  constipation  No  Symptoms suggestive of HYPERTHYROIDISM:  weight loss  No heat intolerance No hyperdefecation  No palpitations  No, unless excited   Compressive symptoms:  dysphagia  No dysphonia  No positional dyspnea (especially with simultaneous arms elevation)  No  Smokes  No On biotin  No Personal history of head/neck surgery/irradiation  No  11/18/2015  A 2.5 cm right thyroid  nodule which was initially seen and was hypermetabolic on PET scan. Request for ultrasound guided fine needle aspirate biopsy. Diagnosis THYROID , FINE NEEDLE ASPIRATION RIGHT MIDDLE POLE (SPECIMEN 1 OF 1, COLLECTED ON 11/18/2015): CONSISTENT WITH BENIGN FOLLICULAR NODULE (BETHESDA CATEGORY II).   Physical Exam  Ht 5' 6 (1.676 m)   Wt 192 lb (87.1 kg)   BMI 30.99 kg/m  Constitutional: well developed, well nourished Head: normocephalic, atraumatic, no exophthalmos Eyes: sclera anicteric, no redness Neck: no thyromegaly, + nodule on inspection  Lungs: normal respiratory effort Neurology: alert and oriented, no fine hand tremor Skin: dry, no appreciable rashes Musculoskeletal: no appreciable defects Psychiatric: normal mood and affect  Allergies Allergies  Allergen Reactions   Sulfa Antibiotics Hives and Swelling    Current Medications Patient's Medications  New Prescriptions   No medications on file  Previous Medications   ACETAMINOPHEN  (TYLENOL ) 500 MG TABLET    Take 1,000 mg by mouth every 6 (six)  hours as needed for fever or headache.   ALBUTEROL  (PROVENTIL ) (2.5 MG/3ML) 0.083% NEBULIZER SOLUTION    USE 1 VIAL IN NEBULIZER EVERY 6 HOURS - And As Needed   ALBUTEROL  (VENTOLIN  HFA) 108 (90 BASE) MCG/ACT INHALER    INHALE 1 TO 2 PUFFS INTO THE LUNGS EVERY 4 HOURS AS NEEDED FOR WHEEZING OR SHORTNESS OF BREATH   ALPRAZOLAM  (XANAX ) 1 MG TABLET    TAKE 1 TABLET (1 MG TOTAL) BY MOUTH EVERY 6 (SIX) HOURS AS NEEDED. FOR ANXIETY   APIXABAN  (ELIQUIS ) 5 MG TABS TABLET    Take 1 tablet by mouth twice daily   BEMPEDOIC ACID  180 MG TABS    Take 1 tablet (180 mg total) by mouth daily.   DICYCLOMINE  (BENTYL ) 20 MG TABLET    Take 1 tablet (20 mg total) by mouth every 6 (six) hours as needed for spasms.   ESOMEPRAZOLE  (NEXIUM ) 20 MG CAPSULE    Take 1 capsule (20 mg total) by mouth daily as needed (acid reflux).   FLUOCINONIDE (LIDEX) 0.05 % EXTERNAL SOLUTION    Apply 1 Application topically daily as needed.   FUROSEMIDE  (LASIX ) 40 MG TABLET    Take 1 tablet by mouth twice daily   MULTIPLE VITAMIN (MULTIVITAMIN) TABLET    Take 1 tablet by mouth daily.   NYSTATIN  CREAM (MYCOSTATIN )    Apply 1 Application topically 2 (two) times daily.   POTASSIUM CHLORIDE  SA (KLOR-CON  M) 20 MEQ TABLET    Take 1 tablet (20 mEq total) by mouth as needed (Take as needed when having  diarrhea or when you take 20 mg of Lasix ).   RIFAXIMIN  (XIFAXAN ) 550 MG TABS TABLET    Take 550 mg by mouth 3 (three) times daily.   ROSUVASTATIN  (CRESTOR ) 5 MG TABLET    Take 1 tablet (5 mg total) by mouth every other day.   VALACYCLOVIR (VALTREX) 1000 MG TABLET    as needed.   VITAMIN D , ERGOCALCIFEROL , (DRISDOL ) 1.25 MG (50000 UNIT) CAPS CAPSULE    Take 50,000 Units by mouth every 7 (seven) days.  Modified Medications   No medications on file  Discontinued Medications   No medications on file    Past Medical History Past Medical History:  Diagnosis Date   Anxiety    Aortic atherosclerosis (HCC) 10/06/2021   Arthritis    neck   Cannabis  dependence with current use (HCC) 07/26/2016   Chronic back pain    Chronic bronchitis (HCC)    Chronic diastolic CHF (congestive heart failure) (HCC)    Chronic neck pain    Cocaine abuse (HCC) 04/05/2021   COPD mixed type (HCC) 02/18/2019   Office Spirometry 10/21/2015-slight restriction of exhaled volume, mild obstruction. FVC 2.69/78%, FEV1 2.04/76%, FEV1/FVC 0.76, FEF 25-75 percent 1.68/65% PFT 04/27/2016-minimal obstructive airways disease, minimal diffusion defect, insignificant response to bronchodilator. FVC 2.79/78%, FEV1 2.11/76%, ratio 0.76, FEF 25-75 percent 1.81/72%, TLC 92%, DLCO 76%.   DDD (degenerative disc disease), cervical 06/15/2015   Diverticulosis    Eczema 11/29/2017   Encounter for long-term opiate analgesic use 07/06/2017   Essential hypertension 09/25/2015   GERD (gastroesophageal reflux disease)    takes Nexium  daily   Helicobacter pylori gastritis 08/2019   Tx and eradicated   Hyperlipidemia    not on any meds bc refused to pick up from pharmacy   Long-term current use of opiate analgesic 04/05/2021   Moderate episode of recurrent major depressive disorder (HCC) 01/28/2020   Obesity (BMI 30.0-34.9) 12/16/2021   Obsessive-compulsive disorder 06/08/2007   Qualifier: Diagnosis of  By: Tanda Milling     PAF (paroxysmal atrial fibrillation) (HCC) 12/16/2021   PANIC DISORDER 06/08/2007   Qualifier: Diagnosis of  By: Tanda Milling     Pneumonia    2016   PONV (postoperative nausea and vomiting)    Primary osteoarthritis involving multiple joints 01/21/2015   Seborrheic dermatitis of scalp 02/11/2015   Seizures (HCC)    in the 80's   Thyroid  nodule 07/30/2020   IMPRESSION: No significant interval change of bilateral thyroid  nodules.   The above is in keeping with the ACR TI-RADS recommendations - J Am Coll Radiol 2017;14:587-595.     Electronically Signed   By: Aliene Lloyd M.D.   On: 08/17/2020 14:18   Type II diabetes mellitus with manifestations (HCC)  02/18/2019   The 10-year ASCVD risk score Verdon DC Mickey., et al., 2013) is: 9.1%   Values used to calculate the score:     Age: 32 years     Sex: Female     Is Non-Hispanic African American: No     Diabetic: Yes     Tobacco smoker: No     Systolic Blood Pressure: 138 mmHg     Is BP treated: No     HDL Cholesterol: 46.2 mg/dL     Total Cholesterol: 180 mg/dL    Past Surgical History Past Surgical History:  Procedure Laterality Date   ATRIAL FIBRILLATION ABLATION N/A 09/08/2022   Procedure: ATRIAL FIBRILLATION ABLATION;  Surgeon: Inocencio Soyla Lunger, MD;  Location: [facility];  Service: Cardiovascular;  Laterality: N/A;   COLONOSCOPY     ECTOPIC PREGNANCY SURGERY     x 2    ESOPHAGOGASTRODUODENOSCOPY     LAPAROSCOPIC LYSIS INTESTINAL ADHESIONS     x 2   RESECTION OF MEDIASTINAL MASS N/A 06/10/2016   Procedure: RESECTION OF MEDIASTINAL MASS;  Surgeon: Dallas KATHEE Jude, MD;  Location: [facility] OR;  Service: Thoracic;  Laterality: N/A;   VIDEO ASSISTED THORACOSCOPY Right 06/10/2016   Procedure: VIDEO ASSISTED THORACOSCOPY WITH PLACEMENT OF ON Q TUNNELER;  Surgeon: Dallas KATHEE Jude, MD;  Location: [facility] OR;  Service: Thoracic;  Laterality: Right;   VIDEO BRONCHOSCOPY N/A 06/10/2016   Procedure: VIDEO BRONCHOSCOPY;  Surgeon: Dallas KATHEE Jude, MD;  Location: [facility];  Service: Thoracic;  Laterality: N/A;    Family History family history includes Breast cancer in her sister; Clotting disorder in her mother; Colon cancer in her sister; Diabetes in her mother; Heart attack in her father; Heart failure in her father and mother; Kidney disease in her mother; Prostate cancer in her father.  Social History Social History   Socioeconomic History   Marital status: Divorced    Spouse name: Not on file   Number of children: 1   Years of education: Not on file   Highest education level: Not on file  Occupational History   Occupation: retired  Tobacco Use   Smoking status: Former    Current  packs/day: 0.00    Average packs/day: 1 pack/day for 36.0 years (36.0 ttl pk-yrs)    Types: Cigarettes    Start date: 61    Quit date: 2017    Years since quitting: 8.6   Smokeless tobacco: Never   Tobacco comments:    Former smoker last cigarette 2017 04/15/22  Vaping Use   Vaping status: Never Used  Substance and Sexual Activity   Alcohol use: No    Alcohol/week: 0.0 standard drinks of alcohol   Drug use: No   Sexual activity: Not Currently    Birth control/protection: None, Post-menopausal  Other Topics Concern   Not on file  Social History Narrative   Retired [employer]   Divorced   1 son   Former smoker   2 coffee/daty   No EtOH, no drugs   Social Drivers of Corporate investment banker Strain: Not on file  Food Insecurity: No Food Insecurity (12/14/2023)   Hunger Vital Sign    Worried About Running Out of Food in the Last Year: Never true    Ran Out of Food in the Last Year: Never true  Transportation Needs: No Transportation Needs (12/14/2023)   PRAPARE - Administrator, Civil Service (Medical): No    Lack of Transportation (Non-Medical): No  Physical Activity: Not on file  Stress: Not on file  Social Connections: Moderately Integrated (12/14/2023)   Social Connection and Isolation Panel    Frequency of Communication with Friends and Family: Three times a week    Frequency of Social Gatherings with Friends and Family: Three times a week    Attends Religious Services: More than 4 times per year    Active Member of Clubs or Organizations: No    Attends Banker Meetings: Never    Marital Status: Married  Catering manager Violence: Not At Risk (12/14/2023)   Humiliation, Afraid, Rape, and Kick questionnaire    Fear of Current or Ex-Partner: No    Emotionally Abused: No    Physically Abused: No  Sexually Abused: No    Laboratory Investigations Lab Results  Component Value Date   TSH 1.17 01/16/2024   TSH 1.890 08/03/2021    TSH 1.98 10/28/2020   FREET4 1.4 01/16/2024     No results found for: TSI   No components found for: TRAB   Lab Results  Component Value Date   CHOL 212 (H) 01/11/2024   Lab Results  Component Value Date   HDL 52 01/11/2024   Lab Results  Component Value Date   LDLCALC 143 (H) 01/11/2024   Lab Results  Component Value Date   TRIG 95 01/11/2024   Lab Results  Component Value Date   CHOLHDL 4.1 01/11/2024   Lab Results  Component Value Date   CREATININE 0.73 01/09/2024   Lab Results  Component Value Date   GFR 85.21 01/09/2024      Component Value Date/Time   NA 137 01/09/2024 1628   NA 140 06/29/2023 0836   K 3.6 01/09/2024 1628   CL 102 01/09/2024 1628   CO2 29 01/09/2024 1628   GLUCOSE 102 (H) 01/09/2024 1628   BUN 9 01/09/2024 1628   BUN 10 06/29/2023 0836   CREATININE 0.73 01/09/2024 1628   CALCIUM  9.2 01/09/2024 1628   PROT 7.7 01/09/2024 1628   PROT 6.9 06/29/2023 0836   ALBUMIN 4.4 01/09/2024 1628   ALBUMIN 4.2 06/29/2023 0836   AST 17 01/09/2024 1628   ALT 15 01/09/2024 1628   ALKPHOS 61 01/09/2024 1628   BILITOT 0.6 01/09/2024 1628   BILITOT 0.4 06/29/2023 0836   GFRNONAA >60 12/15/2023 0449   GFRAA >60 06/12/2016 0343      Latest Ref Rng & Units 01/09/2024    4:28 PM 12/15/2023    4:49 AM 12/14/2023    5:39 AM  BMP  Glucose 70 - 99 mg/dL 897  886  86   BUN 6 - 23 mg/dL 9  14  15    Creatinine 0.40 - 1.20 mg/dL 9.26  9.29  9.50   Sodium 135 - 145 mEq/L 137  139  138   Potassium 3.5 - 5.1 mEq/L 3.6  3.4  3.5   Chloride 96 - 112 mEq/L 102  109  105   CO2 19 - 32 mEq/L 29  26  25    Calcium  8.4 - 10.5 mg/dL 9.2  8.4  8.6        Component Value Date/Time   WBC 8.0 01/09/2024 1628   RBC 5.24 (H) 01/09/2024 1628   HGB 14.6 01/09/2024 1628   HGB 15.2 06/29/2023 0836   HCT 44.3 01/09/2024 1628   HCT 47.5 (H) 06/29/2023 0836   PLT 322.0 01/09/2024 1628   PLT 292 06/29/2023 0836   MCV 84.4 01/09/2024 1628   MCV 90 06/29/2023 0836   MCH  28.9 12/15/2023 0449   MCHC 33.0 01/09/2024 1628   RDW 14.5 01/09/2024 1628   RDW 13.5 06/29/2023 0836   LYMPHSABS 2.2 01/09/2024 1628   LYMPHSABS 2.0 08/25/2022 0911   MONOABS 0.8 01/09/2024 1628   EOSABS 0.1 01/09/2024 1628   EOSABS 0.2 08/25/2022 0911   BASOSABS 0.1 01/09/2024 1628   BASOSABS 0.1 08/25/2022 0911      Parts of this note may have been dictated using voice recognition software. There may be variances in spelling and vocabulary which are unintentional. Not all errors are proofread. Please notify the dino if any discrepancies are noted or if the meaning of any statement is not clear.

## 2024-03-19 ENCOUNTER — Ambulatory Visit (INDEPENDENT_AMBULATORY_CARE_PROVIDER_SITE_OTHER): Admitting: Internal Medicine

## 2024-03-19 ENCOUNTER — Encounter: Payer: Self-pay | Admitting: Internal Medicine

## 2024-03-19 VITALS — BP 116/70 | HR 70 | Ht 66.0 in | Wt 192.0 lb

## 2024-03-19 DIAGNOSIS — K219 Gastro-esophageal reflux disease without esophagitis: Secondary | ICD-10-CM

## 2024-03-19 DIAGNOSIS — K580 Irritable bowel syndrome with diarrhea: Secondary | ICD-10-CM

## 2024-03-19 MED ORDER — ESOMEPRAZOLE MAGNESIUM 20 MG PO CPDR: 20.0000 mg | DELAYED_RELEASE_CAPSULE | Freq: Every day | ORAL | 11 refills | Status: DC | PRN

## 2024-03-19 NOTE — Progress Notes (Signed)
 Felicia Odom 68 y.o. June 04, 1956 [id number]  Assessment & Plan:   Encounter Diagnoses  Name Primary?   Irritable bowel syndrome with diarrhea Yes   Gastroesophageal reflux disease, unspecified whether esophagitis present          IBS-D well-controlled. Diarrhea resolved, bowel movements once or twice in the morning, stools formed but somewhat loose. Low calprotectin levels indicate no inflammation.  GERD Occasional heartburn after certain foods. Nexium  effective as needed. - Refilled Nexium  prescription for as-needed use.          Subjective:   Chief Complaint: f/u diarrhea, Nexium  reill  HPI Discussed the use of AI scribe software for clinical note transcription with the patient, who gave verbal consent to proceed.   Felicia Odom is a 68 year old female with irritable bowel syndrome with diarrhea who presents for follow-up. Her son is with her.  Other medical problems include history of colon polyps, GERD, H. pylori gastritis, CHF, paroxysmal atrial fibrillation on Eliquis , multinodular thyroid .  The patient has had several protracted episodes of diarrhea and has been hospitalized.  Problems began at the end of 2024 and are well outlined in other notes.  She has had Sapa virus on GI pathogen panel in December 2024 and May 2025.  She has been treated with Xifaxan  twice since December given history of IBS-D.  She did have a fecal calprotectin of 627 on December 25, 2023.  She also has a history of pelvic pain, pelvic floor dysfunction and a rectocele and has seen urogynecology.  No intervention has been undertaken at this point.  MR abdomen 01/17/2024 showed steatosis of the liver but was otherwise unremarkable.  (Ordered by Dr. Sedonia of urology).  Her diarrhea has resolved, and she now experiences bowel movements once or twice in the morning, which are formed but soft. This pattern is similar to her bowel habits prior to her recent illness. No incontinence.  Not  complaining of abdominal pain.  She is pleased with how she feels at this time.   A calprotectin test conducted in July showed low levels.  January 24, 2024 calprotectin 35.  Recent TFTs normal.  She uses Nexium  as needed for heartburn, particularly after consuming onions. She requests a refill for this medication.            Previous GI Procedures/Imaging    CT A/P 12/13/2023 No acute abnormality to correspond with the given clinical history.    Colonoscopy 04/13/2023  Impression:  - Decreased voluntary sphincter tone, small rectocele and exagerated descent found on digital rectal exam. She has fecal incontinence issues and chronic loose stools (negative colon biopsies 2021)  - One 15 mm polyp in the transverse colon, removed piecemeal using a cold snare. Resected and retrieved.  - Diverticulosis in the sigmoid colon.  - The examination was otherwise normal on direct and retroflexion views.  - Personal history of colonic polyps. 10 mm ssp 2021 - Recall 3 years Path: FINAL DIAGNOSIS       1. Surgical [P], colon, transverse, polyp (1) :       - SESSILE SERRATED POLYP WITHOUT CYTOLOGIC DYSPLASIA, INFLAMED.       - NEGATIVE FOR MALIGNANCY.    Colonoscopy 09/19/2019  Impression:  - One 10 mm polyp in the transverse colon, removed with a cold snare and removed piecemeal using a hot snare. Resected and retrieved. - Diverticulosis in the sigmoid colon.  - The examined portion of the ileum was normal.  - The examination  was otherwise normal on direct and retroflexion views. Path: 3. Surgical [P], random sites - COLONIC MUCOSA WITH NO SIGNIFICANT HISTOPATHOLOGIC CHANGES. - NO MICROSCOPIC COLITIS, ACTIVE INFLAMMATION OR CHRONIC CHANGES. 4. Surgical [P], colon, transverse, polyp - SESSILE SERRATED POLYP(S) WITHOUT CYTOLOGIC DYSPLASIA. - NO EVIDENCE OF CARCINOMA.   EGD 09/19/2019 Impression: - No endoscopic esophageal abnormality to explain patient' s dysphagia. Esophagus dilated. Dilated.   - Erosive gastropathy with no stigmata of recent bleeding. Biopsied.  - Duodenitis. Biopsied.  - The examination was otherwise normal. Path: 1. Surgical [P], gastric antrum - ANTRAL MUCOSA WITH MILDLY ACTIVE CHRONIC HELICOBACTER PYLORI GASTRITIS AND FOCAL INTESTINAL METAPLASIA. - WARTHIN-STARRY POSITIVE FOR HELICOBACTER PYLORI. - NO DYSPLASIA OR CARCINOMA. 2. Surgical [P], duodenal bulb - DUODENAL MUCOSA WITH BRUNNER GLAND HYPERPLASIA AND CHANGES CONSISTENT WITH PEPTIC INJURY. - NO FEATURES OF SPRUE, DYSPLASIA OR CARCINOMA.             Allergies  Allergen Reactions   Sulfa Antibiotics Hives and Swelling   Current Meds  Medication Sig   acetaminophen  (TYLENOL ) 500 MG tablet Take 1,000 mg by mouth every 6 (six) hours as needed for fever or headache.   albuterol  (PROVENTIL ) (2.5 MG/3ML) 0.083% nebulizer solution USE 1 VIAL IN NEBULIZER EVERY 6 HOURS - And As Needed   albuterol  (VENTOLIN  HFA) 108 (90 Base) MCG/ACT inhaler INHALE 1 TO 2 PUFFS INTO THE LUNGS EVERY 4 HOURS AS NEEDED FOR WHEEZING OR SHORTNESS OF BREATH   ALPRAZolam  (XANAX ) 1 MG tablet TAKE 1 TABLET (1 MG TOTAL) BY MOUTH EVERY 6 (SIX) HOURS AS NEEDED. FOR ANXIETY   apixaban  (ELIQUIS ) 5 MG TABS tablet Take 1 tablet by mouth twice daily   Bempedoic Acid  180 MG TABS Take 1 tablet (180 mg total) by mouth daily.   clindamycin (CLEOCIN T) 1 % external solution Apply 1 Application topically 2 (two) times daily.   dicyclomine  (BENTYL ) 20 MG tablet Take 1 tablet (20 mg total) by mouth every 6 (six) hours as needed for spasms.   esomeprazole  (NEXIUM ) 20 MG capsule Take 1 capsule (20 mg total) by mouth daily as needed (acid reflux).   fluocinonide (LIDEX) 0.05 % external solution Apply 1 Application topically daily as needed.   [Paused] furosemide  (LASIX ) 40 MG tablet Take 1 tablet by mouth twice daily   Multiple Vitamin (MULTIVITAMIN) tablet Take 1 tablet by mouth daily.   nystatin  cream (MYCOSTATIN ) Apply 1 Application topically  2 (two) times daily.   potassium chloride  SA (KLOR-CON  M) 20 MEQ tablet Take 1 tablet (20 mEq total) by mouth as needed (Take as needed when having diarrhea or when you take 20 mg of Lasix ).   rosuvastatin  (CRESTOR ) 5 MG tablet Take 1 tablet (5 mg total) by mouth every other day.   valACYclovir (VALTREX) 1000 MG tablet as needed.   Vitamin D , Ergocalciferol , (DRISDOL ) 1.25 MG (50000 UNIT) CAPS capsule Take 50,000 Units by mouth every 7 (seven) days.   Past Medical History:  Diagnosis Date   Anxiety    Aortic atherosclerosis (HCC) 10/06/2021   Arthritis    neck   Cannabis dependence with current use (HCC) 07/26/2016   Chronic back pain    Chronic bronchitis (HCC)    Chronic diastolic CHF (congestive heart failure) (HCC)    Chronic neck pain    Cocaine abuse (HCC) 04/05/2021   COPD mixed type (HCC) 02/18/2019   Office Spirometry 10/21/2015-slight restriction of exhaled volume, mild obstruction. FVC 2.69/78%, FEV1 2.04/76%, FEV1/FVC 0.76, FEF 25-75 percent 1.68/65% PFT 04/27/2016-minimal obstructive  airways disease, minimal diffusion defect, insignificant response to bronchodilator. FVC 2.79/78%, FEV1 2.11/76%, ratio 0.76, FEF 25-75 percent 1.81/72%, TLC 92%, DLCO 76%.   DDD (degenerative disc disease), cervical 06/15/2015   Diverticulosis    Eczema 11/29/2017   Encounter for long-term opiate analgesic use 07/06/2017   Essential hypertension 09/25/2015   GERD (gastroesophageal reflux disease)    takes Nexium  daily   Helicobacter pylori gastritis 08/2019   Tx and eradicated   Hyperlipidemia    not on any meds bc refused to pick up from pharmacy   Long-term current use of opiate analgesic 04/05/2021   Moderate episode of recurrent major depressive disorder (HCC) 01/28/2020   Obesity (BMI 30.0-34.9) 12/16/2021   Obsessive-compulsive disorder 06/08/2007   Qualifier: Diagnosis of  By: Tanda Milling     PAF (paroxysmal atrial fibrillation) (HCC) 12/16/2021   PANIC DISORDER 06/08/2007    Qualifier: Diagnosis of  By: Tanda Milling     Pneumonia    2016   PONV (postoperative nausea and vomiting)    Primary osteoarthritis involving multiple joints 01/21/2015   Seborrheic dermatitis of scalp 02/11/2015   Seizures (HCC)    in the 80's   Thyroid  nodule 07/30/2020   IMPRESSION: No significant interval change of bilateral thyroid  nodules.   The above is in keeping with the ACR TI-RADS recommendations - J Am Coll Radiol 2017;14:587-595.     Electronically Signed   By: Aliene Lloyd M.D.   On: 08/17/2020 14:18   Type II diabetes mellitus with manifestations (HCC) 02/18/2019   The 10-year ASCVD risk score Verdon DC Mickey., et al., 2013) is: 9.1%   Values used to calculate the score:     Age: 37 years     Sex: Female     Is Non-Hispanic African American: No     Diabetic: Yes     Tobacco smoker: No     Systolic Blood Pressure: 138 mmHg     Is BP treated: No     HDL Cholesterol: 46.2 mg/dL     Total Cholesterol: 180 mg/dL   Past Surgical History:  Procedure Laterality Date   ATRIAL FIBRILLATION ABLATION N/A 09/08/2022   Procedure: ATRIAL FIBRILLATION ABLATION;  Surgeon: Inocencio Soyla Lunger, MD;  Location: [facility];  Service: Cardiovascular;  Laterality: N/A;   COLONOSCOPY     ECTOPIC PREGNANCY SURGERY     x 2    ESOPHAGOGASTRODUODENOSCOPY     LAPAROSCOPIC LYSIS INTESTINAL ADHESIONS     x 2   RESECTION OF MEDIASTINAL MASS N/A 06/10/2016   Procedure: RESECTION OF MEDIASTINAL MASS;  Surgeon: Dallas KATHEE Jude, MD;  Location: [facility] OR;  Service: Thoracic;  Laterality: N/A;   VIDEO ASSISTED THORACOSCOPY Right 06/10/2016   Procedure: VIDEO ASSISTED THORACOSCOPY WITH PLACEMENT OF ON Q TUNNELER;  Surgeon: Dallas KATHEE Jude, MD;  Location: [facility] OR;  Service: Thoracic;  Laterality: Right;   VIDEO BRONCHOSCOPY N/A 06/10/2016   Procedure: VIDEO BRONCHOSCOPY;  Surgeon: Dallas KATHEE Jude, MD;  Location: [facility] OR;  Service: Thoracic;  Laterality: N/A;   Social History   Social History Narrative    Retired [employer]   Divorced   1 son   Former smoker   2 coffee/daty   No EtOH, no drugs   family history includes Breast cancer in her sister; Clotting disorder in her mother; Colon cancer in her sister; Diabetes in her mother; Heart attack in her father; Heart failure in her father and mother; Kidney disease in her mother; Prostate cancer  in her father.   Review of Systems As per HPI  Objective:   Physical Exam BP 116/70 (BP Location: Left Arm, Patient Position: Sitting, Cuff Size: Normal)   Pulse 70   Ht 5' 6 (1.676 m)   Wt 192 lb (87.1 kg)   BMI 30.99 kg/m

## 2024-03-19 NOTE — Patient Instructions (Addendum)
 We have sent the following medications to your pharmacy for you to pick up at your convenience: Nexium  20 mg daily as needed.   _______________________________________________________  If your blood pressure at your visit was 140/90 or greater, please contact your primary care physician to follow up on this.  _______________________________________________________  If you are age 66 or older, your body mass index should be between 23-30. Your Body mass index is 30.99 kg/m. If this is out of the aforementioned range listed, please consider follow up with your Primary Care Provider.  If you are age 65 or younger, your body mass index should be between 19-25. Your Body mass index is 30.99 kg/m. If this is out of the aformentioned range listed, please consider follow up with your Primary Care Provider.   ________________________________________________________  The [facility] providers would like to encourage you to use MYCHART to communicate with providers for non-urgent requests or questions.  Due to long hold times on the telephone, sending your provider a message by [facility] may be a faster and more efficient way to get a response.  Please allow 48 business hours for a response.  Please remember that this is for non-urgent requests.  _______________________________________________________  [facility] is using a team-based approach to care.  Your team is made up of your doctor and two to three APPS. Our APPS (Nurse Practitioners and Physician Assistants) work with your physician to ensure care continuity for you. They are fully qualified to address your health concerns and develop a treatment plan. They communicate directly with your gastroenterologist to care for you. Seeing the Advanced Practice Practitioners on your physician's team can help you by facilitating care more promptly, often allowing for earlier appointments, access to diagnostic testing, procedures, and other specialty  referrals.   I appreciate the opportunity to care for you. Lupita Commander, MD, [facility]

## 2024-03-20 ENCOUNTER — Telehealth: Payer: Self-pay

## 2024-03-20 NOTE — Telephone Encounter (Signed)
 Copied from CRM [id number]. Topic: Clinical - Medical Advice >> Mar 20, 2024  9:52 AM Terri MATSU wrote: Reason for CRM: Patient stated she is an old patient of Dr.Thomas and he had woke some stuff in her MyChart that she wanted him to remove. Patient wants Dr.Thomas to contact her to talk about it before she takes legal matters. Callback number is [phone]

## 2024-03-26 ENCOUNTER — Telehealth: Payer: Self-pay

## 2024-03-26 NOTE — Telephone Encounter (Signed)
 Spoke with Felicia Odom and advised that we discussed this in 2023 after visit and advised that Dr. Edwyna made an addendum to her chart at the time. Pt states that she did remember that but was unsure what air meant and I explained that it was a reference of how else you could have been positive. Pt states that she is okay with this not being removed and was not mad about it. Pt also states I have turned it over to the Lord.

## 2024-03-28 ENCOUNTER — Other Ambulatory Visit: Payer: Self-pay | Admitting: Physician Assistant

## 2024-03-28 NOTE — Telephone Encounter (Signed)
 I called an spoke with this patient in great detail on Friday 03/22/2024 in regards to this. This patient kept me on the phone for over an hour venting, crying and explaining to me how this made her feel. She also advised me that Dr. Joshua has already made it clear that he wasn't changing anything in her chart. I kept explaining to her that if he already said he wasn't going to change anything in her chart it was not much more that we could do. The patient gave a verbal understanding and thanked me for my time and help. We disconnected the call.   It looks like the patient seen Dr. Joshua a few years ago and he drug screen was positive for cocaine and in his diagnosis he used cocaine abuse in the chart and the patient wants that removed. Dr. Joshua has refused to remove or change it per the patient.

## 2024-03-30 NOTE — Progress Notes (Unsigned)
 Cardiology Office Note:   Date:  04/02/2024  ID:  Felicia Odom, DOB August 06, 1955, MRN [id number] PCP: Maree Leni Edyth DELENA, MD  [facility] Providers Cardiologist:  Lynwood Schilling, MD Electrophysiologist:  Will Gladis Norton, MD {  History of Present Illness:   Felicia Odom is a 68 y.o. female who presents for evaluation of atrial fib.    She was in the ED and 03/25/2022 for this.  She was in atrial fib. She converted back to NSR after IV Cardizem  drip.  She did not appear to be volume overloaded.  She was started on spironolactone  to help with volume and hypokalemia.   Echo on 8/29 demonstrated normal LV function with mild AS.  Of note her event monitor which she wore in August demonstrated paroxysms of A-fib.  She has sinus bradycardia with pauses of 3 seconds as well.  She had nonsustained ventricular tachycardia up to 5 beats.  Her atrial rate with fibrillation averaged at 135.   She saw Dr. Norton and now is status post ablation.    She has had some palpitations since I last saw her but not like her fibrillation.  She actually had to go to the emergency  squad 1 time.  She says she gets upset.  She was lying in bed.  She felt her heart flipping and she becomes agitated.  She said her blood pressure was 200 when they got there but it slowly came down.  They did not find any significant dysrhythmias and did not need to transport her.  She did not take the Cardizem  which had given her to take if she has palpitations.  She is otherwise worried about her husband who is my patient is about to have mitral valve repair.  She is not describing any new chest pressure, neck or arm discomfort.  She is not having any new weight gain or edema.  ROS: Positive for anxiety and occasional leg cramping.  Otherwise as stated in the HPI and negative for all other systems.  Studies Reviewed:    EKG:     Dec 13, 2023 sinus rhythm, rate 76, axis within normal limits, intervals within normal limits,  no acute ST-T wave changes.   Risk Assessment/Calculations:    CHA2DS2-VASc Score = 2   This indicates a 2.2% annual risk of stroke. The patient's score is based upon: CHF History: 0 HTN History: 0 Diabetes History: 0 Stroke History: 0 Vascular Disease History: 0 Age Score: 1 Gender Score: 1       Physical Exam:   VS:  BP 108/63 (BP Location: Left Arm, Patient Position: Sitting, Cuff Size: Normal)   Pulse 72   Ht 5' 6 (1.676 m)   Wt 191 lb 6.4 oz (86.8 kg)   SpO2 97%   BMI 30.89 kg/m    Wt Readings from Last 3 Encounters:  04/02/24 191 lb 6.4 oz (86.8 kg)  03/19/24 192 lb (87.1 kg)  03/14/24 192 lb (87.1 kg)     GEN: Well nourished, well developed in no acute distress NECK: No JVD; No carotid bruits CARDIAC: RRR, soft apical systolic murmur with some very slight diastolic murmurs, rubs, gallops RESPIRATORY:  Clear to auscultation without rales, wheezing or rhonchi  ABDOMEN: Soft, non-tender, non-distended EXTREMITIES:  No edema; No deformity   ASSESSMENT AND PLAN:   Atrial fib with RVR: I do not think she is having any paroxysms of atrial fibrillation.  She has had previous PVCs and other ectopy noted.  At  this point she does not think is worse than when she wore her monitor a couple of years ago.  I am going to encourage her to take the Cardizem  as needed and let us  know if she has any increasing palpitations.   Aortic atherosclerosis:   We are going to manage her with risk reduction.  I am trying to manage her with primary risk reduction.  She did not want to increase her statin dose.  Her LDL is still 143 but she is only been taking her bempedoic acid  every other day.  I encouraged her to take it every day and she can get her lipids checked in about 3 months.     Aortic stenosis: She has mild stenosis and AI.  I will see her in 1 year.    Follow up with me in one year.   Signed, Lynwood Schilling, MD

## 2024-04-02 ENCOUNTER — Encounter: Payer: Self-pay | Admitting: Cardiology

## 2024-04-02 ENCOUNTER — Ambulatory Visit: Attending: Cardiology | Admitting: Cardiology

## 2024-04-02 VITALS — BP 108/63 | HR 72 | Ht 66.0 in | Wt 191.4 lb

## 2024-04-02 DIAGNOSIS — M7989 Other specified soft tissue disorders: Secondary | ICD-10-CM

## 2024-04-02 DIAGNOSIS — I350 Nonrheumatic aortic (valve) stenosis: Secondary | ICD-10-CM | POA: Diagnosis not present

## 2024-04-02 DIAGNOSIS — E785 Hyperlipidemia, unspecified: Secondary | ICD-10-CM

## 2024-04-02 DIAGNOSIS — I4891 Unspecified atrial fibrillation: Secondary | ICD-10-CM | POA: Diagnosis not present

## 2024-04-02 DIAGNOSIS — I700 Atherosclerosis of aorta: Secondary | ICD-10-CM

## 2024-04-02 MED ORDER — DILTIAZEM HCL 30 MG PO TABS
30.0000 mg | ORAL_TABLET | ORAL | Status: DC | PRN
Start: 2024-04-02 — End: 2024-05-30

## 2024-04-02 MED ORDER — BEMPEDOIC ACID 180 MG PO TABS: 180.0000 mg | ORAL_TABLET | Freq: Every day | ORAL | 3 refills | Status: DC

## 2024-04-02 NOTE — Patient Instructions (Addendum)
 Medication Instructions:  Your physician recommends that you continue on your current medications as directed. Please refer to the Current Medication list given to you today.  *If you need a refill on your cardiac medications before your next appointment, please call your pharmacy*  Lab Work: Fasting lipid panel in 3 months at [facility] If you have labs (blood work) drawn today and your tests are completely normal, you will receive your results only by: MyChart Message (if you have MyChart) OR A paper copy in the mail If you have any lab test that is abnormal or we need to change your treatment, we will call you to review the results.  Testing/Procedures: NONE  Follow-Up: At [facility] you and your health needs are our priority.  As part of our continuing mission to provide you with exceptional heart care, our providers are all part of one team.  This team includes your primary Cardiologist (physician) and Advanced Practice Providers or APPs (Physician Assistants and Nurse Practitioners) who all work together to provide you with the care you need, when you need it.  Your next appointment:   1 year  Provider:   [place], MD  We recommend signing up for the patient portal called MyChart.  Sign up information is provided on this After Visit Summary.  MyChart is used to connect with patients for Virtual Visits (Telemedicine).  Patients are able to view lab/test results, encounter notes, upcoming appointments, etc.  Non-urgent messages can be sent to your provider as well.   To learn more about what you can do with MyChart, go to [email or web address].

## 2024-04-05 ENCOUNTER — Other Ambulatory Visit (HOSPITAL_COMMUNITY)
Admission: RE | Admit: 2024-04-05 | Discharge: 2024-04-05 | Disposition: A | Discharge status: Home / Self Care | Source: Ambulatory Visit | Attending: Radiology | Admitting: Radiology

## 2024-04-05 ENCOUNTER — Ambulatory Visit
Admission: RE | Admit: 2024-04-05 | Discharge: 2024-04-05 | Disposition: A | Discharge status: Home / Self Care | Source: Ambulatory Visit | Attending: "Endocrinology | Admitting: "Endocrinology

## 2024-04-05 DIAGNOSIS — E042 Nontoxic multinodular goiter: Secondary | ICD-10-CM

## 2024-04-05 DIAGNOSIS — E041 Nontoxic single thyroid nodule: Secondary | ICD-10-CM | POA: Diagnosis present

## 2024-04-05 DIAGNOSIS — M150 Primary generalized (osteo)arthritis: Secondary | ICD-10-CM

## 2024-04-10 LAB — CYTOLOGY - NON PAP

## 2024-04-17 ENCOUNTER — Ambulatory Visit: Payer: Self-pay | Admitting: "Endocrinology

## 2024-04-17 NOTE — Progress Notes (Signed)
 This encounter was created in error - please disregard.

## 2024-04-22 ENCOUNTER — Ambulatory Visit: Payer: Self-pay | Admitting: Internal Medicine

## 2024-04-22 NOTE — Telephone Encounter (Signed)
 Patient scheduled to see Dr. Neysa tomorrow at 3:15 PM-patient requested to only see Dr. Neysa. Patient reports increased shortness of breath. Speaking in full sentences without any distress. Pulse oximeter is 98% today. Patient reports her inhaler and nebulizer work some of the time.  FYI Only or Action Required?: FYI only for provider.  Patient is followed in Pulmonology for COPD, last seen on 02/13/2024 by Neysa Reggy BIRCH, MD.  Called Nurse Triage reporting Shortness of Breath.  Symptoms began a week ago.  Interventions attempted: Rescue inhaler and Nebulizer treatments.  Symptoms are: unchanged.  Triage Disposition: See Physician Within 24 Hours (overriding See HCP Within 4 Hours (Or PCP Triage))  Patient/caregiver understands and will follow disposition?: Yes  Copied from CRM #[id number]. Topic: Clinical - Red Word Triage >> Apr 22, 2024  8:29 AM Essie A wrote: Red Word that prompted transfer to Nurse Triage: Patient called to get an appointment but she has wheezing and shortness of breath for a week.  Also has a cough but thinks it's from COPD and heart trouble. Reason for Disposition  [1] Longstanding difficulty breathing AND [2] not responding to usual therapy  Answer Assessment - Initial Assessment Questions 1. RESPIRATORY STATUS: Describe your breathing? (e.g., wheezing, shortness of breath, unable to speak, severe coughing)      Shortness of breath, wheezing 2. ONSET: When did this breathing problem begin?      Started a week ago 3. PATTERN Does the difficult breathing come and go, or has it been constant since it started?      Comes and goes.  4. SEVERITY: How bad is your breathing? (e.g., mild, moderate, severe)      moderate 5. RECURRENT SYMPTOM: Have you had difficulty breathing before? If Yes, ask: When was the last time? and What happened that time?      yes 6. CARDIAC HISTORY: Do you have any history of heart disease? (e.g., heart attack, angina,  bypass surgery, angioplasty)      yes 7. LUNG HISTORY: Do you have any history of lung disease?  (e.g., pulmonary embolus, asthma, emphysema)     yes 8. CAUSE: What do you think is causing the breathing problem?      Possibly COPD 9. OTHER SYMPTOMS: Do you have any other symptoms? (e.g., chest pain, cough, dizziness, fever, runny nose)     Cough, runny nose 10. O2 SATURATION MONITOR:  Do you use an oxygen saturation monitor (pulse oximeter) at home? If Yes, ask: What is your reading (oxygen level) today? What is your usual oxygen saturation reading? (e.g., 95%)       98% 12. TRAVEL: Have you traveled out of the country in the last month? (e.g., travel history, exposures)       no  Protocols used: Breathing Difficulty-A-AH

## 2024-04-22 NOTE — Progress Notes (Unsigned)
 Patient ID: Felicia Odom, female    DOB: 11-Mar-1956, 68 y.o.   MRN: [id number]  HPI F former smoker followed for COPD, Allergic rhinitis, complicated by Thymoma/ resected, anxiety Office spirometry- 01/29/15 Normal spirometry. FVC 2.74/81%, FEV1 2.11/79%, FEV1/FVC 0.77, FEF 25-75 percent 1.79 Office Spirometry 10/21/2015-slight restriction of exhaled volume, mild obstruction. FVC 2.69/78%, FEV1 2.04/76%, FEV1/FVC 0.76, FEF 25-75 percent 1.68/65% PFT 04/27/2016-minimal obstructive airways disease, minimal diffusion defect, insignificant response to bronchodilator. FVC 2.79/78%, FEV1 2.11/76%, ratio 0.76, FEF 25-75 percent 1.81/72%, TLC 92%, DLCO 76%. .----------------------------------------------------------------------------------------------------   02/13/24- 68 yoF former smoker (36 pk yrs/tobacco/cannabis) followed for Asthma/Bronchitis/ COPD, allergic Rhinitis, complicated by Resection Thymoma/ TSGY, OCD/panic, Anxiety/ Depression, GERD, DM2, HTN, Eczema, Osteoarthritis, Hyperlipidemia, AFib/ Eliquis , dCHF, Aortic Atherosclerosis, Thyroid  Nodules,,  -Neb albuterol  , Ventolin  hfa, Xanax  1 mg, Low dose screening CT due in Feb, 2026. Discussed the use of AI scribe software for clinical note transcription with the patient, who gave verbal consent to proceed.  History of Present Illness   Felicia Odom is a 68 year old female with asthmatic bronchitis/ mild COPD and chronic anxiety who presents for follow-up care.  She has not needed to use her inhalers recently, despite the rainy weather. She occasionally experiences a small amount of phlegm when coughing, which is not colored. No breathing difficulties or need for inhalers recently. No swollen glands. Maintenance xanax  has helped minimize ER vists/ exacerbations.     Assessment and Plan:    Asthmatic bronchitis (COPD) Mild asthmatic bronchitis, stable with no recent exacerbations. Occasional phlegm production noted. -  Continue current inhalers as needed. - Use breathing machine if symptoms worsen.  Fatty liver Diagnosed with fatty liver, asymptomatic. Efficacy of milk thistle not confirmed.  Hyperlipidemia Intolerance to Crestor  due to muscle pain. Currently on alternative medication for cholesterol management. Cholesterol level high at 240.  Thyroid  nodules Two thyroid  nodules present, stable with no new symptoms.  Anxiety Anxiety managed with Xanax . Refill process complicated by triage questioning. - Continue Xanax  for anxiety management. - Address refill process concerns with the triage team.   04/23/24-68 yoF former smoker (36 pk yrs/tobacco/cannabis) followed for Asthma/Bronchitis/ COPD, allergic Rhinitis, complicated by Resection Thymoma/ TSGY, OCD/panic, Anxiety/ Depression, GERD, DM2, HTN, Eczema, Osteoarthritis, Hyperlipidemia, AFib/ Eliquis , dCHF, Aortic Atherosclerosis, Thyroid  Nodules,,  -Neb albuterol  , Ventolin  hfa, Xanax  1 mg, Eliquis , Low dose screening CT due in Feb, 2026. ACUTE VISIT- pt had called reporting SOB/ wheeze x 1 week. Recently say Cardiology for palpitation.      Review of Systems- see HPI + = positive Constitutional:  + weight loss, +night sweats, fevers, chills, fatigue, lassitude. HEENT:   No-  headaches, difficulty swallowing, tooth/dental problems, sore throat,       No-  sneezing, itching, ear ache, nasal congestion, post nasal drip,  CV:   chest pain, No-orthopnea, PND, swelling in lower extremities, anasarca, dizziness, palpitations Resp: + shortness of breath with exertion or at rest.              productive cough,   non-productive cough,  No- coughing up of blood.            change in color of mucus.  No- wheezing.   Skin: No-   rash or lesions. GI:    heartburn, indigestion, No-abdominal pain, nausea, vomiting,  GU:  MS:  + joint pain or swelling.  . Neuro-     nothing unusual Psych: change in mood or affect. +Chronic depression , + anxiety.  No memory  loss.   Objective:   Physical Exam General- Alert, Oriented , Distress- none acute, + Obese.  Skin- rash-none, lesions- none, excoriation- none Lymphadenopathy- none Head- + healed scar forehead            Eyes- Gross vision intact, PERRLA, conjunctivae clear secretions            Ears- Hearing, canals-normal            Nose- No- rhinorrhea, no-Septal dev, polyps, erosion, perforation             Throat- Mallampati II , mucosa clear , drainage- none, tonsils- atrophic. +Missing teeth Neck- flexible , trachea midline, no stridor , thyroid  nl, carotid no bruit Chest - symmetrical excursion , unlabored           Heart/CV- RRR ,  murmur+1S , no gallop, no rub, nl s1 s2                           - JVD- none , edema- none, stasis changes- none, varices- none           Lung- wheeze-none,  unlabored, cough-none, dullness-none, rub- none,            Chest wall-  +R VATS scars s/p thymectomy. No rub. Abd-  Br/ Gen/ Rectal- Not done, not indicated Extrem-  Neuro- grossly intact to observation

## 2024-04-23 ENCOUNTER — Ambulatory Visit (INDEPENDENT_AMBULATORY_CARE_PROVIDER_SITE_OTHER): Admitting: Internal Medicine

## 2024-04-23 ENCOUNTER — Encounter: Payer: Self-pay | Admitting: Internal Medicine

## 2024-04-23 VITALS — BP 126/64 | HR 99 | Temp 97.8°F | Ht 66.0 in | Wt 189.6 lb

## 2024-04-23 DIAGNOSIS — F410 Panic disorder [episodic paroxysmal anxiety] without agoraphobia: Secondary | ICD-10-CM

## 2024-04-23 DIAGNOSIS — J449 Chronic obstructive pulmonary disease, unspecified: Secondary | ICD-10-CM | POA: Diagnosis not present

## 2024-04-23 MED ORDER — ALPRAZOLAM 1 MG PO TABS: 1.0000 mg | ORAL_TABLET | Freq: Four times a day (QID) | ORAL | 5 refills | Status: AC | PRN

## 2024-04-23 NOTE — Patient Instructions (Signed)
 Alrazolam refilled

## 2024-04-26 NOTE — Assessment & Plan Note (Signed)
 Usually can be talked down from her anxiety with a little patience and reassurance. She would be at risk for withdrawal if abruptly cut off xanax , but it has helped her manage her life.

## 2024-04-26 NOTE — Assessment & Plan Note (Signed)
 Mild with occasional wheeze. May have had mild exacerbation last week, managed with herr meds and not evident now. Plan- Adjust meds if needed. Esp may need to resume a maintenance med.

## 2024-04-29 ENCOUNTER — Other Ambulatory Visit: Payer: Self-pay | Admitting: Internal Medicine

## 2024-04-29 NOTE — Telephone Encounter (Signed)
 Pt requesting refill of Xanax . Looks like last fill was 04/23/24. Please advise, thank you!

## 2024-05-01 ENCOUNTER — Telehealth: Payer: Self-pay | Admitting: Internal Medicine

## 2024-05-01 ENCOUNTER — Other Ambulatory Visit: Payer: Self-pay | Admitting: Cardiology

## 2024-05-01 DIAGNOSIS — I480 Paroxysmal atrial fibrillation: Secondary | ICD-10-CM

## 2024-05-01 MED ORDER — ESOMEPRAZOLE MAGNESIUM 20 MG PO CPDR: 20.0000 mg | DELAYED_RELEASE_CAPSULE | Freq: Every day | ORAL | 11 refills | Status: DC | PRN

## 2024-05-01 NOTE — Telephone Encounter (Signed)
 PT is requesting a refill for esomeprazole  be [facility] to [facility] [address].

## 2024-05-01 NOTE — Telephone Encounter (Signed)
 Prescription refill request for Eliquis  received. Indication: a fib Last office visit: 04/02/24 Scr: 0.73 epic 01/09/24 Age:  68 Weight: 86kg

## 2024-05-01 NOTE — Telephone Encounter (Signed)
Patient informed Rx has been sent in.

## 2024-05-14 ENCOUNTER — Telehealth: Payer: Self-pay | Admitting: Cardiology

## 2024-05-14 DIAGNOSIS — E785 Hyperlipidemia, unspecified: Secondary | ICD-10-CM

## 2024-05-14 LAB — LIPID PANEL
Chol/HDL Ratio: 3.8 ratio (ref 0.0–4.4)
Cholesterol, Total: 184 mg/dL (ref 100–199)
HDL: 49 mg/dL (ref >39)
LDL Chol Calc (NIH): 112 mg/dL — ABNORMAL HIGH (ref 0–99)
Triglycerides: 132 mg/dL (ref 0–149)
VLDL Cholesterol Cal: 23 mg/dL (ref 5–40)

## 2024-05-14 NOTE — Telephone Encounter (Signed)
 Pt reports that at her gyn appt, they drew lipid panel that was ordered by Dr Lavona- but it is not supposed to be drawn until December 9th- plus she had just eaten.   S/w the patient and informed that a new order will be placed for lipid panel and to have drawn in December- make sure to be fasting. She verbalized understanding.

## 2024-05-14 NOTE — Telephone Encounter (Signed)
 Patient says she doesn't know why lipid panel was drawn yesterday while she was at another office. She said she was supposed to have a different type of lab ordered but instead they used order placed by Dr. Lavona. She says she didn't even fast ahead of time. She would like to know if Dr. Lavona can re-order her lab work to be drawn in December. Please advise.

## 2024-05-16 ENCOUNTER — Ambulatory Visit: Admitting: Internal Medicine

## 2024-05-16 ENCOUNTER — Ambulatory Visit: Payer: Self-pay | Admitting: Internal Medicine

## 2024-05-16 ENCOUNTER — Telehealth: Payer: Self-pay

## 2024-05-16 MED ORDER — AZITHROMYCIN 250 MG PO TABS: ORAL_TABLET | ORAL | 0 refills | Status: DC

## 2024-05-16 NOTE — Telephone Encounter (Signed)
 Copied from CRM #[id number]. Topic: Clinical - Red Word Triage >> May 16, 2024  8:07 AM Benton KIDD wrote: [facility] that prompted transfer to Nurse Triage: severe cough aching running nose  Head cold >> May 16, 2024  3:54 PM Leila BROCKS wrote: Patient [phone] states called this morning asking for a zpak from Dr. Neysa and spoke with NT. Patient states head cold, head congestion, blowing mucous is yellow, sore throat gargling with salt, hoarse, unsure if she has a fever- maybe her fever broke, woke up sweating. Patient denies shortness of breath, wheezing, dizziness, nor pain. Patient states negative for Covid. Patient want to get antibiotics before the symptoms gets worse. Unable to reach CAL, called a few times. Please advise and call back.   [id number] [place], [place] - [address] [place] [address] Phone: [phone] Fax: [phone]  Dr Neysa, please advise sick note.

## 2024-05-16 NOTE — Telephone Encounter (Signed)
 Please advise,patient is requesting antibiotics

## 2024-05-16 NOTE — Telephone Encounter (Signed)
 Script for Z-pak sent.

## 2024-05-16 NOTE — Telephone Encounter (Signed)
 FYI Only or Action Required?: Action required by provider: update on patient condition.  Patient is followed in Pulmonology for COPD, last seen on 04/23/2024 by Neysa Reggy BIRCH, MD.  Called Nurse Triage reporting Cough.  Symptoms began x 2 days.  Interventions attempted: OTC medications: Robitussin.  Symptoms are: unchanged.  Triage Disposition: See Physician Within 24 Hours  Patient/caregiver understands and will follow disposition?: Yes  **Patient requesting Z-pak; see note below**            Copied from CRM #[id number]. Topic: Clinical - Red Word Triage >> May 16, 2024  8:07 AM Benton O wrote: [facility] that prompted transfer to Nurse Triage: severe cough aching running nose  Head cold Reason for Disposition  [1] Continuous (nonstop) coughing interferes with work or school AND [2] no improvement using cough treatment per Care Advice  Answer Assessment - Initial Assessment Questions 1. ONSET: When did the cough begin?      X 2 days   2. SEVERITY: How bad is the cough today?      Intermittent-mild   3. SPUTUM: Describe the color of your sputum (e.g., none, dry cough; clear, white, yellow, green)     Clear   4. HEMOPTYSIS: Are you coughing up any blood? If Yes, ask: How much? (e.g., flecks, streaks, tablespoons, etc.)     No   5. DIFFICULTY BREATHING: Are you having difficulty breathing? If Yes, ask: How bad is it? (e.g., mild, moderate, severe)      No   6. FEVER: Do you have a fever? If Yes, ask: What is your temperature, how was it measured, and when did it start?     Not currently    7. CARDIAC HISTORY: Do you have any history of heart disease? (e.g., heart attack, congestive heart failure)      Heart ablation   8. LUNG HISTORY: Do you have any history of lung disease?  (e.g., pulmonary embolus, asthma, emphysema)     COPD  9. PE RISK FACTORS: Do you have a history of blood clots? (or: recent major surgery, recent prolonged travel,  bedridden)     No   10. OTHER SYMPTOMS: Do you have any other symptoms? (e.g., runny nose, wheezing, chest pain)   Runny nose, sore throat   Patient requesting Zpak, she is taking Robitussin to assist with cough. No SOB, wheezing noted.  Protocols used: Cough - Acute Productive-A-AH

## 2024-05-17 ENCOUNTER — Ambulatory Visit: Payer: Self-pay | Admitting: Cardiology

## 2024-05-17 DIAGNOSIS — E785 Hyperlipidemia, unspecified: Secondary | ICD-10-CM

## 2024-05-17 NOTE — Telephone Encounter (Signed)
 I called and spoke with patient, advised her that the Zpack had been sent in for her.  She stated her son went and got it yesterday and she could already tell a difference.  Nothing further needed.

## 2024-05-20 NOTE — Telephone Encounter (Signed)
 Spoke with pt regarding her results. Pt stated she was not fasting at the time of this lab draw. Pt stated she had labs at her PCP and her LDL was 98. Pt is requesting to have a repeat lipid panel drawn in December. Pt was told her request would be sent to Dr. Lavona for his suggestions. Pt verbalized understanding. All questions if any were answered.

## 2024-05-20 NOTE — Telephone Encounter (Signed)
-----   Message from Lynwood Schilling sent at 05/17/2024  5:01 PM EDT ----- Her LDL is better but not perfect on the current meds and she has not wanted med titration.  I am happy with the result.  No change in therapy.  Call Ms. Estelle with the results and send results  to Maree Leni Edyth DELENA, MD ----- Message ----- From: Rebecka Memos Lab Results In Sent: 05/14/2024   2:36 AM EDT To: Lynwood Schilling, MD

## 2024-05-20 NOTE — Telephone Encounter (Signed)
 Pt requesting callback for results. Please advise.

## 2024-05-21 ENCOUNTER — Telehealth: Payer: Self-pay | Admitting: Cardiology

## 2024-05-21 ENCOUNTER — Other Ambulatory Visit: Payer: Self-pay | Admitting: Cardiology

## 2024-05-21 DIAGNOSIS — I480 Paroxysmal atrial fibrillation: Secondary | ICD-10-CM

## 2024-05-21 NOTE — Telephone Encounter (Signed)
 Prescription refill request for Eliquis  received. Indication:afib Last office visit:9/25 Scr:0.73  6/25 Age: 68 Weight:86  kg  Prescription refilled

## 2024-05-21 NOTE — Telephone Encounter (Signed)
 This medication was already filled earlier today.

## 2024-05-21 NOTE — Telephone Encounter (Signed)
*  STAT* If patient is at the pharmacy, call can be transferred to refill team.   1. Which medications need to be refilled? (please list name of each medication and dose if known)   ELIQUIS  5 MG TABS tablet    2. Which pharmacy/location (including street and city if local pharmacy) is medication to be sent to?  [id number] [place], [place] - [address]      3. Do they need a 30 day or 90 day supply? 90 day  Pt is out of medication

## 2024-05-22 ENCOUNTER — Other Ambulatory Visit: Payer: Self-pay | Admitting: Cardiology

## 2024-05-22 ENCOUNTER — Other Ambulatory Visit: Payer: Self-pay | Admitting: Internal Medicine

## 2024-05-29 ENCOUNTER — Telehealth: Payer: Self-pay | Admitting: Internal Medicine

## 2024-05-29 NOTE — Telephone Encounter (Signed)
 Please advise Sir, thank you.

## 2024-05-29 NOTE — Telephone Encounter (Signed)
Felicia Odom informed.

## 2024-05-29 NOTE — Telephone Encounter (Signed)
 PJ can you look behind me and see if stool testing is needed? I do not see anything

## 2024-05-29 NOTE — Progress Notes (Signed)
 Patient ID: Felicia Odom, female    DOB: Sep 19, 1955, 68 y.o.   MRN: [id number]  HPI F former smoker followed for COPD, Allergic rhinitis, complicated by Thymoma/ resected, anxiety Office spirometry- 01/29/15 Normal spirometry. FVC 2.74/81%, FEV1 2.11/79%, FEV1/FVC 0.77, FEF 25-75 percent 1.79 Office Spirometry 10/21/2015-slight restriction of exhaled volume, mild obstruction. FVC 2.69/78%, FEV1 2.04/76%, FEV1/FVC 0.76, FEF 25-75 percent 1.68/65% PFT 04/27/2016-minimal obstructive airways disease, minimal diffusion defect, insignificant response to bronchodilator. FVC 2.79/78%, FEV1 2.11/76%, ratio 0.76, FEF 25-75 percent 1.81/72%, TLC 92%, DLCO 76%. ==========================================================================================================    04/23/24-68 yoF former smoker (36 pk yrs/tobacco/cannabis) followed for Asthma/Bronchitis/ COPD, allergic Rhinitis, complicated by Resection Thymoma/ TSGY, OCD/panic, Anxiety/ Depression, GERD, DM2, HTN, Eczema, Osteoarthritis, Hyperlipidemia, AFib/ Eliquis , dCHF, Aortic Atherosclerosis, Thyroid  Nodules,,  -Neb albuterol  , Ventolin  hfa, Xanax  1 mg, Eliquis , Low dose screening CT due in Feb, 2026. ACUTE VISIT- pt had called reporting SOB/ wheeze x 1 week. Using rescue about 1x/ week.  Recently saw Cardiology for palpitation. Reviewed asthma meds. Doesn't feel she has infection. May have wheezed some yesterday,  Discussed her anxiety. Has done much better with xanax . Aware of dependence potential. No episodes of early request. She is worried about tenderness in axillary tail of R breast but has mammogram scheduled next week and will alert them. Continues cardiology fu.  05/30/24- 68 yoF former smoker (36 pk yrs/tobacco/cannabis) followed for Asthma/Bronchitis/ COPD, allergic Rhinitis, complicated by Resection Thymoma/ TSGY, OCD/panic, Anxiety/ Depression, GERD, DM2, HTN, Eczema, Osteoarthritis, Hyperlipidemia, AFib/ Eliquis , dCHF,  Aortic Atherosclerosis, Thyroid  Nodules,,  -Neb albuterol  , Ventolin  hfa, Xanax  1 mg, Eliquis , Low dose screening CT due in Feb, 2026. Currently reports doing well - resolve a simple cold recently. Discussed the use of AI scribe software for clinical note transcription with the patient, who gave verbal consent to proceed.  History of Present Illness   Felicia Odom is a 68 year old female who presents for a follow-up visit after a recent cold and sore throat.  She experienced a cold with significant sore throat symptoms, which are now intermittent. Her nasal discharge remains clear. Warm salt water gargles have provided relief, and azithromycin  has helped alleviate her symptoms. During the cold, she had a fever, which she considered typical.  She has a history of smoking. She undergoes annual lung screenings, with the next one scheduled for February. Anxiety stable. Less bothered now by lingering R thoracotomy pain after thymus surgery.     Assessment and Plan:    Chronic obstructive pulmonary disease (COPD) Stable after recent minor URI Heart murmur noted but not concerning.  Anxiety disorder Managed with Xanax , used sparingly. Positive outlook maintained through activity. - Follow-up in March for Xanax  prescription renewal.     Review of Systems- see HPI + = positive Constitutional:  + weight loss, +night sweats, fevers, chills, fatigue, lassitude. HEENT:   No-  headaches, difficulty swallowing, tooth/dental problems, sore throat,       No-  sneezing, itching, ear ache, nasal congestion, post nasal drip,  CV:   chest pain, No-orthopnea, PND, swelling in lower extremities, anasarca, dizziness, palpitations Resp: + shortness of breath with exertion or at rest.              productive cough,   non-productive cough,  No- coughing up of blood.            change in color of mucus.  Occ wheezing.   Skin: No-   rash or lesions. GI:    heartburn,  indigestion, No-abdominal pain,  nausea, vomiting,  GU:  MS:  + joint pain or swelling.  . Neuro-     nothing unusual Psych: change in mood or affect. +Chronic depression , + anxiety.  No memory loss.   Objective:   Physical Exam General- Alert, Oriented , Distress- none acute, + Obese.  Skin- rash-none, lesions- none, excoriation- none Lymphadenopathy- none Head- + healed scar forehead            Eyes- Gross vision intact, PERRLA, conjunctivae clear secretions            Ears- Hearing, canals-normal            Nose- No- rhinorrhea, no-Septal dev, polyps, erosion, perforation             Throat- Mallampati II , mucosa clear , drainage- none, tonsils- atrophic. +Missing teeth Neck- flexible , trachea midline, no stridor , thyroid  nl, carotid no bruit Chest - symmetrical excursion , unlabored           Heart/CV- RRR ,  murmur+1S , no gallop, no rub, nl s1 s2                           - JVD- none , edema- none, stasis changes- none, varices- none           Lung- wheeze-none,  unlabored, cough-none, dullness-none, rub- none,            Chest wall-  +R VATS scars s/p thymectomy. No rub. Abd-  Br/ Gen/ Rectal- Not done, not indicated Extrem-  Neuro- grossly intact to observation

## 2024-05-29 NOTE — Telephone Encounter (Signed)
 I spoke with Felicia Odom and told her their are [facility] She said she will call them.

## 2024-05-29 NOTE — Telephone Encounter (Signed)
 Inbound call from [facility] stating esomeprazole  will not be covered by patients insurance but omeprazole  20mg  or 40mg  will or pantoprazole  40mg   Please advise  Thank you

## 2024-05-29 NOTE — Telephone Encounter (Signed)
 Inbound call from patient stating that she is needing a refill on her medication for Esomeprazole  20 MG. Please advise.

## 2024-05-29 NOTE — Telephone Encounter (Signed)
 I don't see anything. We can ask Dr Avram.

## 2024-05-29 NOTE — Telephone Encounter (Signed)
 I was on the phone with Lattie about a rx refill and she asked me about the stool test. She said it says Elspeth placed the order. Perhaps this was from a long time ago when he was Dr Darilyn RN. I told her I didn't see anything but we're double checking with Dr Avram.   She said tell Dr Avram she is doing well. She no longer needs the dicyclomine  and requested I take it off her list which I did. She said Dr Avram has fixed her.

## 2024-05-29 NOTE — Telephone Encounter (Signed)
 There are no pending stool test that I am aware of, the last calprotectin she did was normal.  I am glad she is doing well.

## 2024-05-29 NOTE — Telephone Encounter (Signed)
 Inbound call from patient trying to find out if she is needing to do a stool sample because she has read something in her MyChart advising her she has to have a stool sample done by November the 12 th. Please advise.

## 2024-05-30 ENCOUNTER — Encounter: Payer: Self-pay | Admitting: Internal Medicine

## 2024-05-30 ENCOUNTER — Ambulatory Visit: Admitting: Internal Medicine

## 2024-05-30 VITALS — BP 122/76 | HR 73 | Temp 97.8°F | Ht 66.0 in | Wt 191.2 lb

## 2024-05-30 DIAGNOSIS — R011 Cardiac murmur, unspecified: Secondary | ICD-10-CM | POA: Diagnosis not present

## 2024-05-30 DIAGNOSIS — F419 Anxiety disorder, unspecified: Secondary | ICD-10-CM

## 2024-05-30 DIAGNOSIS — Z87891 Personal history of nicotine dependence: Secondary | ICD-10-CM | POA: Diagnosis not present

## 2024-05-30 DIAGNOSIS — J449 Chronic obstructive pulmonary disease, unspecified: Secondary | ICD-10-CM | POA: Diagnosis not present

## 2024-05-30 MED ORDER — OMEPRAZOLE 20 MG PO CPDR
20.0000 mg | DELAYED_RELEASE_CAPSULE | Freq: Every day | ORAL | 11 refills | Status: AC | PRN
Start: 2024-05-30 — End: ?

## 2024-05-30 NOTE — Patient Instructions (Signed)
 Ok to continue  current meds. We will bring you back in March for your new doctor and to get the xanax  refilled.  Please call sooner if we can help

## 2024-05-30 NOTE — Telephone Encounter (Signed)
 I spoke to [place] and she wants to have a PPI on hand in case she needs it. She said some foods bother her like onions.

## 2024-05-30 NOTE — Addendum Note (Signed)
 Addended by: AVRAM PITTS E on: 05/30/2024 01:01 PM   Modules accepted: Orders

## 2024-05-30 NOTE — Telephone Encounter (Addendum)
 She was using that as needed.  Find out if she thinks she still needs to take it and if she needs a PPI prescribed the omeprazole  20 mg daily as needed #30 11 refills.

## 2024-05-30 NOTE — Addendum Note (Signed)
 Addended by: Nazirah Tri E on: 05/30/2024 04:51 PM   Modules accepted: Orders

## 2024-06-01 ENCOUNTER — Other Ambulatory Visit: Payer: Self-pay | Admitting: Cardiology

## 2024-06-01 DIAGNOSIS — I480 Paroxysmal atrial fibrillation: Secondary | ICD-10-CM

## 2024-06-03 ENCOUNTER — Encounter: Payer: Self-pay | Admitting: Internal Medicine

## 2024-06-03 NOTE — Telephone Encounter (Signed)
 Eliquis  5mg  refill request received. Patient is 67 years old, weight-86.7kg, Crea-0.73 on 01/09/24, Diagnosis-Afib, and last seen by Dr. Lavona on 04/02/24. Dose is appropriate based on dosing criteria. Will send in refill to requested pharmacy.

## 2024-06-22 ENCOUNTER — Other Ambulatory Visit: Payer: Self-pay | Admitting: Cardiology

## 2024-07-03 ENCOUNTER — Other Ambulatory Visit: Payer: Self-pay | Admitting: Internal Medicine

## 2024-07-05 NOTE — Telephone Encounter (Signed)
 Copied from CRM #[id number]. Topic: Clinical - Prescription Issue >> Jul 05, 2024 12:32 PM Russell PARAS wrote: Reason for CRM:   Abby, nurse with [facility], is contacting clinic regarding some of the pt's prescribed meds.   Asking for provider to review the below meds that pt is currently prescribed  Doxycycline  20 mg Alprazolam  1 mg  These medications are high risk for pts age and can cause adverse effects. Requesting for provider to review and either consider stopping meds or reducing dosage  Requested call back  CB#  [phone]  Please advise

## 2024-07-12 LAB — LIPID PANEL
Chol/HDL Ratio: 4.4 ratio (ref 0.0–4.4)
Cholesterol, Total: 231 mg/dL — ABNORMAL HIGH (ref 100–199)
HDL: 53 mg/dL
LDL Chol Calc (NIH): 152 mg/dL — ABNORMAL HIGH (ref 0–99)
Triglycerides: 147 mg/dL (ref 0–149)
VLDL Cholesterol Cal: 26 mg/dL (ref 5–40)

## 2024-08-02 ENCOUNTER — Other Ambulatory Visit: Payer: Self-pay | Admitting: Cardiology

## 2024-08-02 DIAGNOSIS — R252 Cramp and spasm: Secondary | ICD-10-CM

## 2024-08-05 ENCOUNTER — Encounter: Payer: Self-pay | Admitting: *Deleted

## 2024-08-11 ENCOUNTER — Other Ambulatory Visit: Payer: Self-pay | Admitting: Internal Medicine

## 2024-08-12 ENCOUNTER — Other Ambulatory Visit: Payer: Self-pay | Admitting: Cardiology

## 2024-08-12 ENCOUNTER — Telehealth: Payer: Self-pay

## 2024-08-12 ENCOUNTER — Other Ambulatory Visit: Payer: Self-pay

## 2024-08-12 DIAGNOSIS — J4520 Mild intermittent asthma, uncomplicated: Secondary | ICD-10-CM

## 2024-08-12 MED ORDER — ALBUTEROL SULFATE (2.5 MG/3ML) 0.083% IN NEBU: INHALATION_SOLUTION | RESPIRATORY_TRACT | 11 refills | Status: AC

## 2024-08-12 NOTE — Telephone Encounter (Signed)
 Copied from CRM [id number]. Topic: Clinical - Order For Equipment >> Aug 12, 2024  8:43 AM LaVerne A wrote: Reason for CRM: Patient is a former patient of Dr. Neysa and has an appointment to see Dr. Zaida.  She needs a new prescription sent to [facility] for a mouth piece which is part of the medicine.  She would like for someone to call [facility] to get this done because she got sick on Friday.  Please return her call at [phone].  Thanks.    Spoke with patient VBU, Rx refill  sent to [facility]  okay per Dr. Neysa notes from 05/2024 - has up coming appointment with [facility] MD

## 2024-08-15 NOTE — Telephone Encounter (Signed)
 Lipids completed on 07/11/24

## 2024-08-29 ENCOUNTER — Ambulatory Visit: Payer: Self-pay | Admitting: Internal Medicine

## 2024-08-29 DIAGNOSIS — J4520 Mild intermittent asthma, uncomplicated: Secondary | ICD-10-CM

## 2024-08-29 DIAGNOSIS — J449 Chronic obstructive pulmonary disease, unspecified: Secondary | ICD-10-CM

## 2024-08-29 NOTE — Telephone Encounter (Signed)
 FYI Only or Action Required?: Action required by provider: medication refill request.  Patient is followed in Pulmonology for COPD mixed type, last seen on 05/30/2024 by Neysa Reggy BIRCH, MD.  Called Nurse Triage reporting Cough. And wanting fax from [facility] to be receved and to send order for Albuterol  nebulizer solution and new nebulizer machine. Pt needs ASAP. [facility] stated they sent fax over to office.   Symptoms began several days ago.  Interventions attempted: Rescue inhaler.  Symptoms are: gradually worsening.  Triage Disposition: See Physician Within 24 Hours  Patient/caregiver understands and will follow disposition?: No, refuses disposition  E2C2 Pulmonary Triage - Initial Assessment Questions Chief Complaint (e.g., cough, sob, wheezing, fever, chills, sweat or additional symptoms) *Go to specific symptom protocol after initial questions. cough  How long have symptoms been present? Chronic- stated cough worse without nebulizer   Have you tested for COVID or Flu? Note: If not, ask patient if a home test can be taken. If so, instruct patient to call back for positive results. No  MEDICINES:   Have you used any OTC meds to help with symptoms? No If yes, ask What medications? N/a  Have you used your inhalers/maintenance medication? Yes If yes, What medications? Albuterol    If inhaler, ask How many puffs and how often? Note: Review instructions on medication in the chart. 1-2 puffs every 4 hours  pt stated that she has been doing more but would not say how much more.   OXYGEN: Do you wear supplemental oxygen? No If yes, How many liters are you supposed to use? N/a  Do you monitor your oxygen levels? No If yes, What is your reading (oxygen level) today? N/a  What is your usual oxygen saturation reading?  (Note: Pulmonary O2 sats should be 90% or greater) N/a     Message from [facility] sent at 08/29/2024  3:07 PM EST  Reason for Triage:   pt reports coughing up clear phlegm, SOB, wheezing, crackling in her chest when she lays down - she has not been able to do her breathing treatments since she is missing her nebulizer mouth piece   Reason for Disposition  [1] Known COPD or other severe lung disease (i.e., bronchiectasis, cystic fibrosis, lung surgery) AND [2] symptoms getting worse (i.e., increased sputum purulence or amount, increased breathing difficulty  Answer Assessment - Initial Assessment Questions 1. ONSET: When did the cough begin?      Worsening cough 2. SEVERITY: How bad is the cough today?      wheezing 3. SPUTUM: Describe the color of your sputum (e.g., none, dry cough; clear, white, yellow, green)     Clear thick plugs 4. HEMOPTYSIS: Are you coughing up any blood? If Yes, ask: How much? (e.g., flecks, streaks, tablespoons, etc.)     *No Answer* 5. DIFFICULTY BREATHING: Are you having difficulty breathing? If Yes, ask: How bad is it? (e.g., mild, moderate, severe)      Mild SOB wheezing 6. FEVER: Do you have a fever? If Yes, ask: What is your temperature, how was it measured, and when did it start?     no 7. CARDIAC HISTORY: Do you have any history of heart disease? (e.g., heart attack, congestive heart failure)      CHF, a fib  8. LUNG HISTORY: Do you have any history of lung disease?  (e.g., pulmonary embolus, asthma, emphysema)     COPD   10. OTHER SYMPTOMS: Do you have any other symptoms? (e.g., runny nose, wheezing, chest pain)  Runny nose, wheezing  11. PREGNANCY: Is there any chance you are pregnant? When was your last menstrual period?       N/a 12. TRAVEL: Have you traveled out of the country in the last month? (e.g., travel history, exposures)       N/a  Protocols used: Cough - Acute Productive-A-AH

## 2024-08-30 ENCOUNTER — Telehealth: Payer: Self-pay

## 2024-08-30 ENCOUNTER — Other Ambulatory Visit: Payer: Self-pay

## 2024-08-30 DIAGNOSIS — J449 Chronic obstructive pulmonary disease, unspecified: Secondary | ICD-10-CM

## 2024-08-30 DIAGNOSIS — J4520 Mild intermittent asthma, uncomplicated: Secondary | ICD-10-CM

## 2024-08-30 NOTE — Telephone Encounter (Signed)
 Copied from CRM #[id number]. Topic: Clinical - Medication Question >> Aug 30, 2024 10:57 AM Dedra B wrote: Reason for CRM: Felicia Odom from [facility] said they received an order for albuterol  but got a request for a new prescription from pharmacy. Request was faxed 08/22/24. She wants to know if it was received.  She can be reached at [phone].   Spoke with [facility] new Rx sent out for neb solution

## 2024-08-30 NOTE — Telephone Encounter (Signed)
 I called and spoke to pt. Pt states she is in need of the Albuterol  Neb refills and needs it sent to [facility]. I informed pt I could send this in. Pt verbalized understanding. NFN

## 2024-08-30 NOTE — Telephone Encounter (Signed)
 Copied from CRM [id number]. Topic: Clinical - Prescription Issue >> Aug 30, 2024 11:11 AM Benton O wrote: Reason for CRM: patient is calling because she spoke with [facility] againa dn they say they have not received a refill and they are needing a new prescription refill for the albuterol  otherwise patient  is not able to get anything . I did let the patient know that I see that it was sent on 1/19 . [facility] has faxed 3 times and no response please reach out concerning this issue .  [phone] robin or brandy [facility]    Reaching out to [facility]  This encounter was created in error - please disregard.

## 2024-08-30 NOTE — Telephone Encounter (Signed)
 Copied from CRM [id number]. Topic: Clinical - Order For Equipment >> Aug 29, 2024  2:53 PM Ismael A wrote: Reason for CRM: patient called in she spoke with [facility] who states they are waiting on prescription for new mouth piece for nebulizer - they have faxed us  the document and are requesting us  to fill it out and fax it back to the fax number listed on the sheet   Order has been placed for new mouth piece to [facility] , MD will sign when back in clinic paperwork

## 2024-09-04 ENCOUNTER — Ambulatory Visit (HOSPITAL_BASED_OUTPATIENT_CLINIC_OR_DEPARTMENT_OTHER)

## 2024-09-26 ENCOUNTER — Encounter

## 2024-09-30 ENCOUNTER — Encounter

## 2025-01-15 ENCOUNTER — Ambulatory Visit: Admitting: "Endocrinology
